# Patient Record
Sex: Female | Born: 1945 | Race: White | Hispanic: No | Marital: Married | Smoking: Former smoker
Health system: Southern US, Community
[De-identification: ages and names within clinical notes are randomized; demographics above are authoritative.]

## PROBLEM LIST (undated history)

## (undated) DIAGNOSIS — J309 Allergic rhinitis, unspecified: Secondary | ICD-10-CM

## (undated) DIAGNOSIS — H6980 Other specified disorders of Eustachian tube, unspecified ear: Secondary | ICD-10-CM

## (undated) DIAGNOSIS — R32 Unspecified urinary incontinence: Secondary | ICD-10-CM

## (undated) DIAGNOSIS — G8929 Other chronic pain: Secondary | ICD-10-CM

## (undated) DIAGNOSIS — I471 Supraventricular tachycardia, unspecified: Secondary | ICD-10-CM

## (undated) DIAGNOSIS — R0609 Other forms of dyspnea: Secondary | ICD-10-CM

## (undated) DIAGNOSIS — M21619 Bunion of unspecified foot: Secondary | ICD-10-CM

## (undated) DIAGNOSIS — R0789 Other chest pain: Secondary | ICD-10-CM

## (undated) DIAGNOSIS — D72829 Elevated white blood cell count, unspecified: Secondary | ICD-10-CM

## (undated) DIAGNOSIS — E785 Hyperlipidemia, unspecified: Secondary | ICD-10-CM

## (undated) DIAGNOSIS — K5900 Constipation, unspecified: Secondary | ICD-10-CM

## (undated) DIAGNOSIS — Z86010 Personal history of colonic polyps: Secondary | ICD-10-CM

## (undated) DIAGNOSIS — M545 Low back pain, unspecified: Secondary | ICD-10-CM

## (undated) DIAGNOSIS — F419 Anxiety disorder, unspecified: Secondary | ICD-10-CM

## (undated) DIAGNOSIS — F329 Major depressive disorder, single episode, unspecified: Secondary | ICD-10-CM

## (undated) DIAGNOSIS — M549 Dorsalgia, unspecified: Secondary | ICD-10-CM

## (undated) DIAGNOSIS — M79609 Pain in unspecified limb: Secondary | ICD-10-CM

## (undated) DIAGNOSIS — R002 Palpitations: Secondary | ICD-10-CM

## (undated) DIAGNOSIS — R0989 Other specified symptoms and signs involving the circulatory and respiratory systems: Secondary | ICD-10-CM

## (undated) DIAGNOSIS — M1710 Unilateral primary osteoarthritis, unspecified knee: Secondary | ICD-10-CM

## (undated) DIAGNOSIS — R42 Dizziness and giddiness: Secondary | ICD-10-CM

## (undated) DIAGNOSIS — M25519 Pain in unspecified shoulder: Secondary | ICD-10-CM

## (undated) HISTORY — DX: Other chest pain: R07.89

## (undated) HISTORY — PX: CHOLECYSTECTOMY: SHX55

## (undated) HISTORY — PX: LUMBAR LAMINECTOMY: SHX95

## (undated) HISTORY — DX: Anxiety disorder, unspecified: F41.9

## (undated) HISTORY — DX: Unspecified urinary incontinence: R32

## (undated) HISTORY — DX: Other specified symptoms and signs involving the circulatory and respiratory systems: R09.89

## (undated) HISTORY — DX: Supraventricular tachycardia, unspecified: I47.10

## (undated) HISTORY — DX: Other specified disorders of Eustachian tube, unspecified ear: H69.80

## (undated) HISTORY — DX: Dizziness and giddiness: R42

## (undated) HISTORY — DX: Personal history of colonic polyps: Z86.010

## (undated) HISTORY — PX: BUNIONECTOMY: SHX129

## (undated) HISTORY — DX: Major depressive disorder, single episode, unspecified: F32.9

## (undated) HISTORY — DX: Low back pain: M54.5

## (undated) HISTORY — DX: Palpitations: R00.2

## (undated) HISTORY — DX: Pain in unspecified limb: M79.609

## (undated) HISTORY — DX: Low back pain, unspecified: M54.50

## (undated) HISTORY — DX: Hyperlipidemia, unspecified: E78.5

## (undated) HISTORY — DX: Pain in unspecified shoulder: M25.519

## (undated) HISTORY — PX: OTHER SURGICAL HISTORY: SHX169

## (undated) HISTORY — DX: Constipation, unspecified: K59.00

## (undated) HISTORY — DX: Bunion of unspecified foot: M21.619

## (undated) HISTORY — DX: Elevated white blood cell count, unspecified: D72.829

## (undated) HISTORY — DX: Unilateral primary osteoarthritis, unspecified knee: M17.10

## (undated) HISTORY — DX: Dorsalgia, unspecified: M54.9

## (undated) HISTORY — DX: Other forms of dyspnea: R06.09

## (undated) HISTORY — DX: Other chronic pain: G89.29

## (undated) HISTORY — DX: Allergic rhinitis, unspecified: J30.9

## (undated) HISTORY — DX: Supraventricular tachycardia: I47.1

---

## 1998-07-07 ENCOUNTER — Emergency Department (HOSPITAL_COMMUNITY): Admission: EM | Admit: 1998-07-07 | Discharge: 1998-07-07 | Payer: Self-pay | Admitting: Emergency Medicine

## 1998-08-05 ENCOUNTER — Emergency Department (HOSPITAL_COMMUNITY): Admission: EM | Admit: 1998-08-05 | Discharge: 1998-08-05 | Payer: Self-pay | Admitting: Emergency Medicine

## 1998-08-25 ENCOUNTER — Inpatient Hospital Stay (HOSPITAL_COMMUNITY): Admission: EM | Admit: 1998-08-25 | Discharge: 1998-08-29 | Payer: Self-pay | Admitting: *Deleted

## 1998-11-14 ENCOUNTER — Other Ambulatory Visit: Admission: RE | Admit: 1998-11-14 | Discharge: 1998-11-14 | Payer: Self-pay | Admitting: *Deleted

## 1998-12-12 ENCOUNTER — Encounter: Admission: RE | Admit: 1998-12-12 | Discharge: 1998-12-12 | Payer: Self-pay | Admitting: *Deleted

## 1999-01-21 ENCOUNTER — Emergency Department (HOSPITAL_COMMUNITY): Admission: EM | Admit: 1999-01-21 | Discharge: 1999-01-21 | Payer: Self-pay | Admitting: Emergency Medicine

## 1999-01-30 ENCOUNTER — Inpatient Hospital Stay (HOSPITAL_COMMUNITY): Admission: EM | Admit: 1999-01-30 | Discharge: 1999-02-01 | Payer: Self-pay | Admitting: Emergency Medicine

## 1999-02-13 ENCOUNTER — Emergency Department (HOSPITAL_COMMUNITY): Admission: EM | Admit: 1999-02-13 | Discharge: 1999-02-13 | Payer: Self-pay | Admitting: Emergency Medicine

## 2000-06-06 ENCOUNTER — Other Ambulatory Visit: Admission: RE | Admit: 2000-06-06 | Discharge: 2000-06-06 | Payer: Self-pay | Admitting: *Deleted

## 2000-09-07 ENCOUNTER — Emergency Department (HOSPITAL_COMMUNITY): Admission: EM | Admit: 2000-09-07 | Discharge: 2000-09-07 | Payer: Self-pay

## 2000-12-31 ENCOUNTER — Ambulatory Visit (HOSPITAL_COMMUNITY): Admission: RE | Admit: 2000-12-31 | Discharge: 2000-12-31 | Payer: Self-pay | Admitting: Gastroenterology

## 2000-12-31 ENCOUNTER — Encounter (INDEPENDENT_AMBULATORY_CARE_PROVIDER_SITE_OTHER): Payer: Self-pay | Admitting: Specialist

## 2001-08-18 ENCOUNTER — Encounter: Payer: Self-pay | Admitting: Internal Medicine

## 2001-08-18 ENCOUNTER — Encounter: Admission: RE | Admit: 2001-08-18 | Discharge: 2001-08-18 | Payer: Self-pay | Admitting: Internal Medicine

## 2002-02-08 ENCOUNTER — Emergency Department (HOSPITAL_COMMUNITY): Admission: EM | Admit: 2002-02-08 | Discharge: 2002-02-08 | Payer: Self-pay | Admitting: Emergency Medicine

## 2002-03-24 ENCOUNTER — Other Ambulatory Visit: Admission: RE | Admit: 2002-03-24 | Discharge: 2002-03-24 | Payer: Self-pay | Admitting: Gynecology

## 2002-09-03 ENCOUNTER — Ambulatory Visit (HOSPITAL_COMMUNITY): Admission: RE | Admit: 2002-09-03 | Discharge: 2002-09-03 | Payer: Self-pay | Admitting: Specialist

## 2002-09-03 ENCOUNTER — Encounter: Payer: Self-pay | Admitting: Specialist

## 2003-06-27 ENCOUNTER — Other Ambulatory Visit: Admission: RE | Admit: 2003-06-27 | Discharge: 2003-06-27 | Payer: Self-pay | Admitting: Gynecology

## 2003-07-07 ENCOUNTER — Emergency Department (HOSPITAL_COMMUNITY): Admission: EM | Admit: 2003-07-07 | Discharge: 2003-07-07 | Payer: Self-pay | Admitting: Emergency Medicine

## 2003-08-02 ENCOUNTER — Inpatient Hospital Stay (HOSPITAL_COMMUNITY): Admission: EM | Admit: 2003-08-02 | Discharge: 2003-08-09 | Payer: Self-pay | Admitting: Psychiatry

## 2003-08-18 ENCOUNTER — Inpatient Hospital Stay (HOSPITAL_COMMUNITY): Admission: EM | Admit: 2003-08-18 | Discharge: 2003-08-26 | Payer: Self-pay | Admitting: Psychiatry

## 2003-09-10 ENCOUNTER — Emergency Department (HOSPITAL_COMMUNITY): Admission: EM | Admit: 2003-09-10 | Discharge: 2003-09-10 | Payer: Self-pay | Admitting: Emergency Medicine

## 2003-09-10 ENCOUNTER — Encounter: Payer: Self-pay | Admitting: Emergency Medicine

## 2003-10-03 ENCOUNTER — Encounter: Payer: Self-pay | Admitting: Emergency Medicine

## 2003-10-03 ENCOUNTER — Emergency Department (HOSPITAL_COMMUNITY): Admission: EM | Admit: 2003-10-03 | Discharge: 2003-10-03 | Payer: Self-pay | Admitting: Emergency Medicine

## 2004-03-07 ENCOUNTER — Inpatient Hospital Stay (HOSPITAL_COMMUNITY): Admission: EM | Admit: 2004-03-07 | Discharge: 2004-03-16 | Payer: Self-pay | Admitting: Psychiatry

## 2004-10-04 ENCOUNTER — Other Ambulatory Visit: Admission: RE | Admit: 2004-10-04 | Discharge: 2004-10-04 | Payer: Self-pay | Admitting: Gynecology

## 2004-12-09 LAB — HM COLONOSCOPY: HM Colonoscopy: ABNORMAL

## 2005-03-05 ENCOUNTER — Inpatient Hospital Stay (HOSPITAL_COMMUNITY): Admission: RE | Admit: 2005-03-05 | Discharge: 2005-03-11 | Payer: Self-pay | Admitting: Psychiatry

## 2005-03-05 ENCOUNTER — Ambulatory Visit: Payer: Self-pay | Admitting: Psychiatry

## 2005-03-06 ENCOUNTER — Encounter (HOSPITAL_COMMUNITY): Payer: Self-pay | Admitting: Psychiatry

## 2005-03-06 IMAGING — CR DG CHEST 2V
2 series · 2 of 2 positions shown · non-contrast
Comparison: none

CLINICAL DATA: Short of breath.  Cough.  
 CHEST - 2 VIEW:
 Minimal linear scarring or atelectasis laterally at the left lung base.  Vascular clips in the upper abdomen.   Right lung clear.  Heart size and pulmonary vascularity normal.  No effusion.

[view not recorded (1 of 2)]
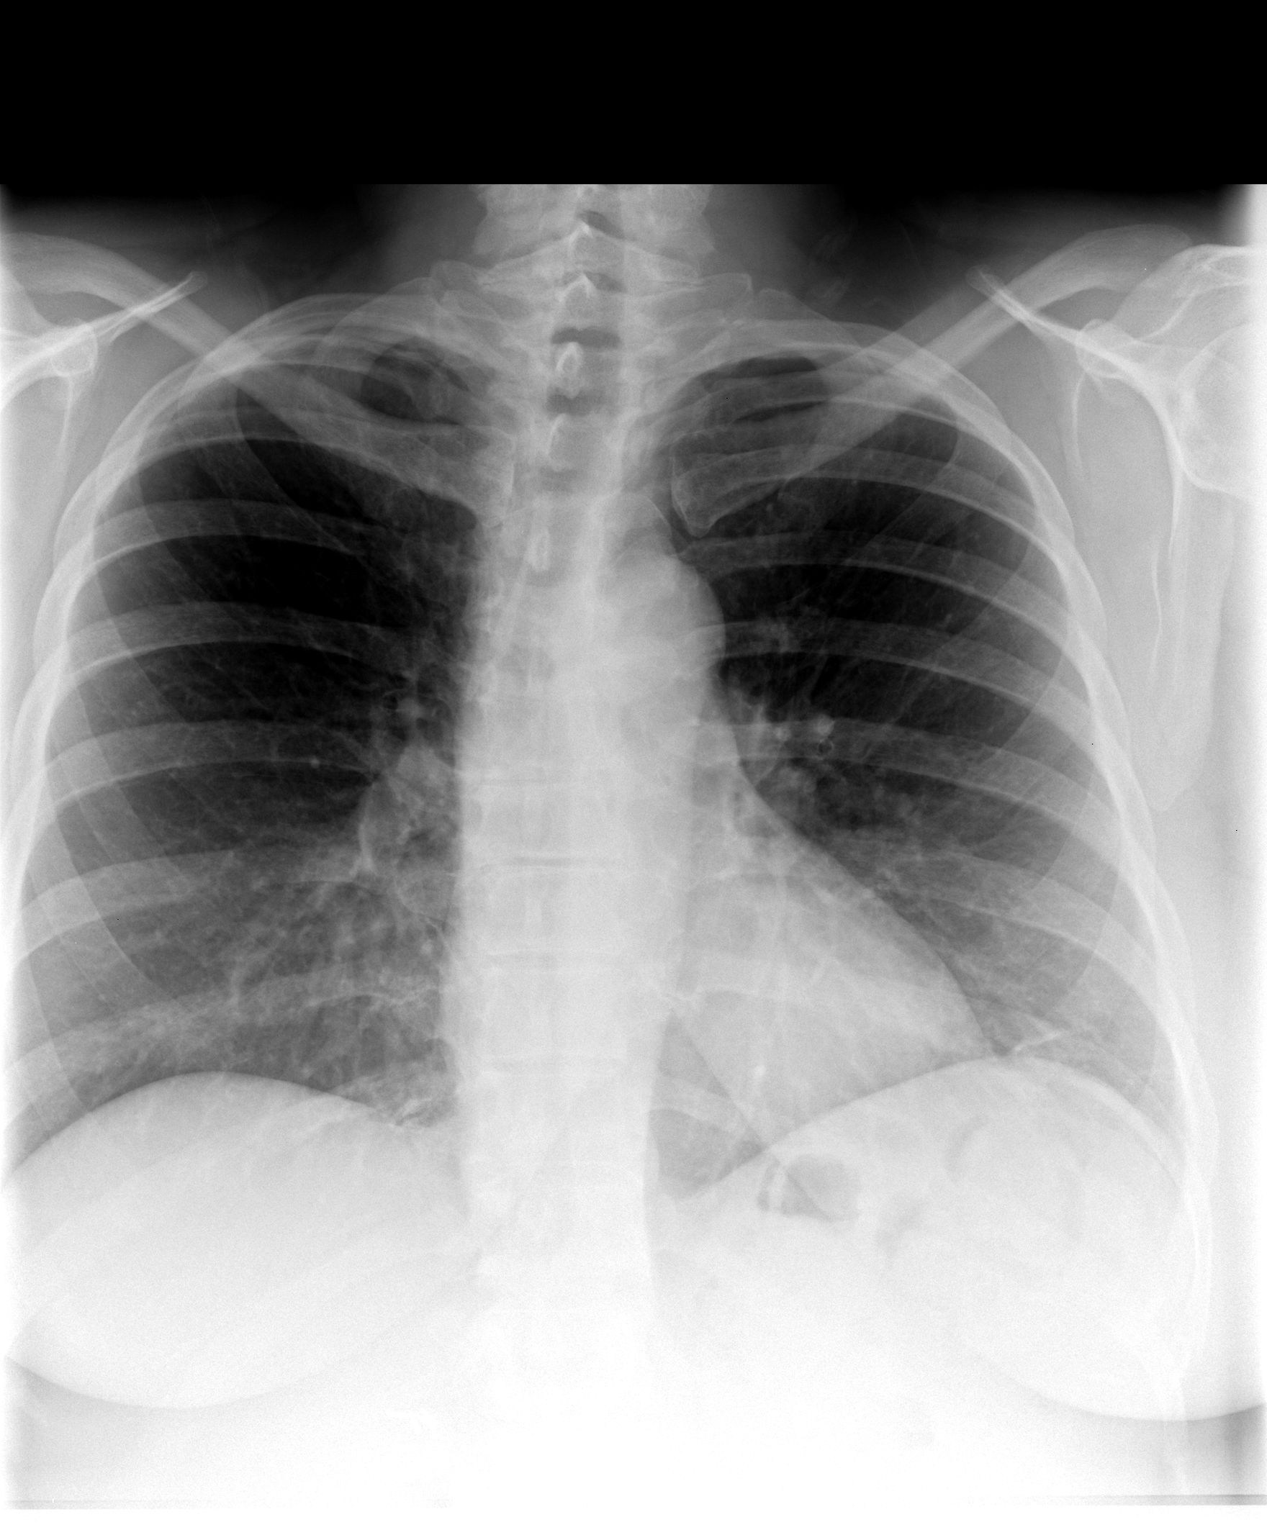

[view not recorded (2 of 2)]
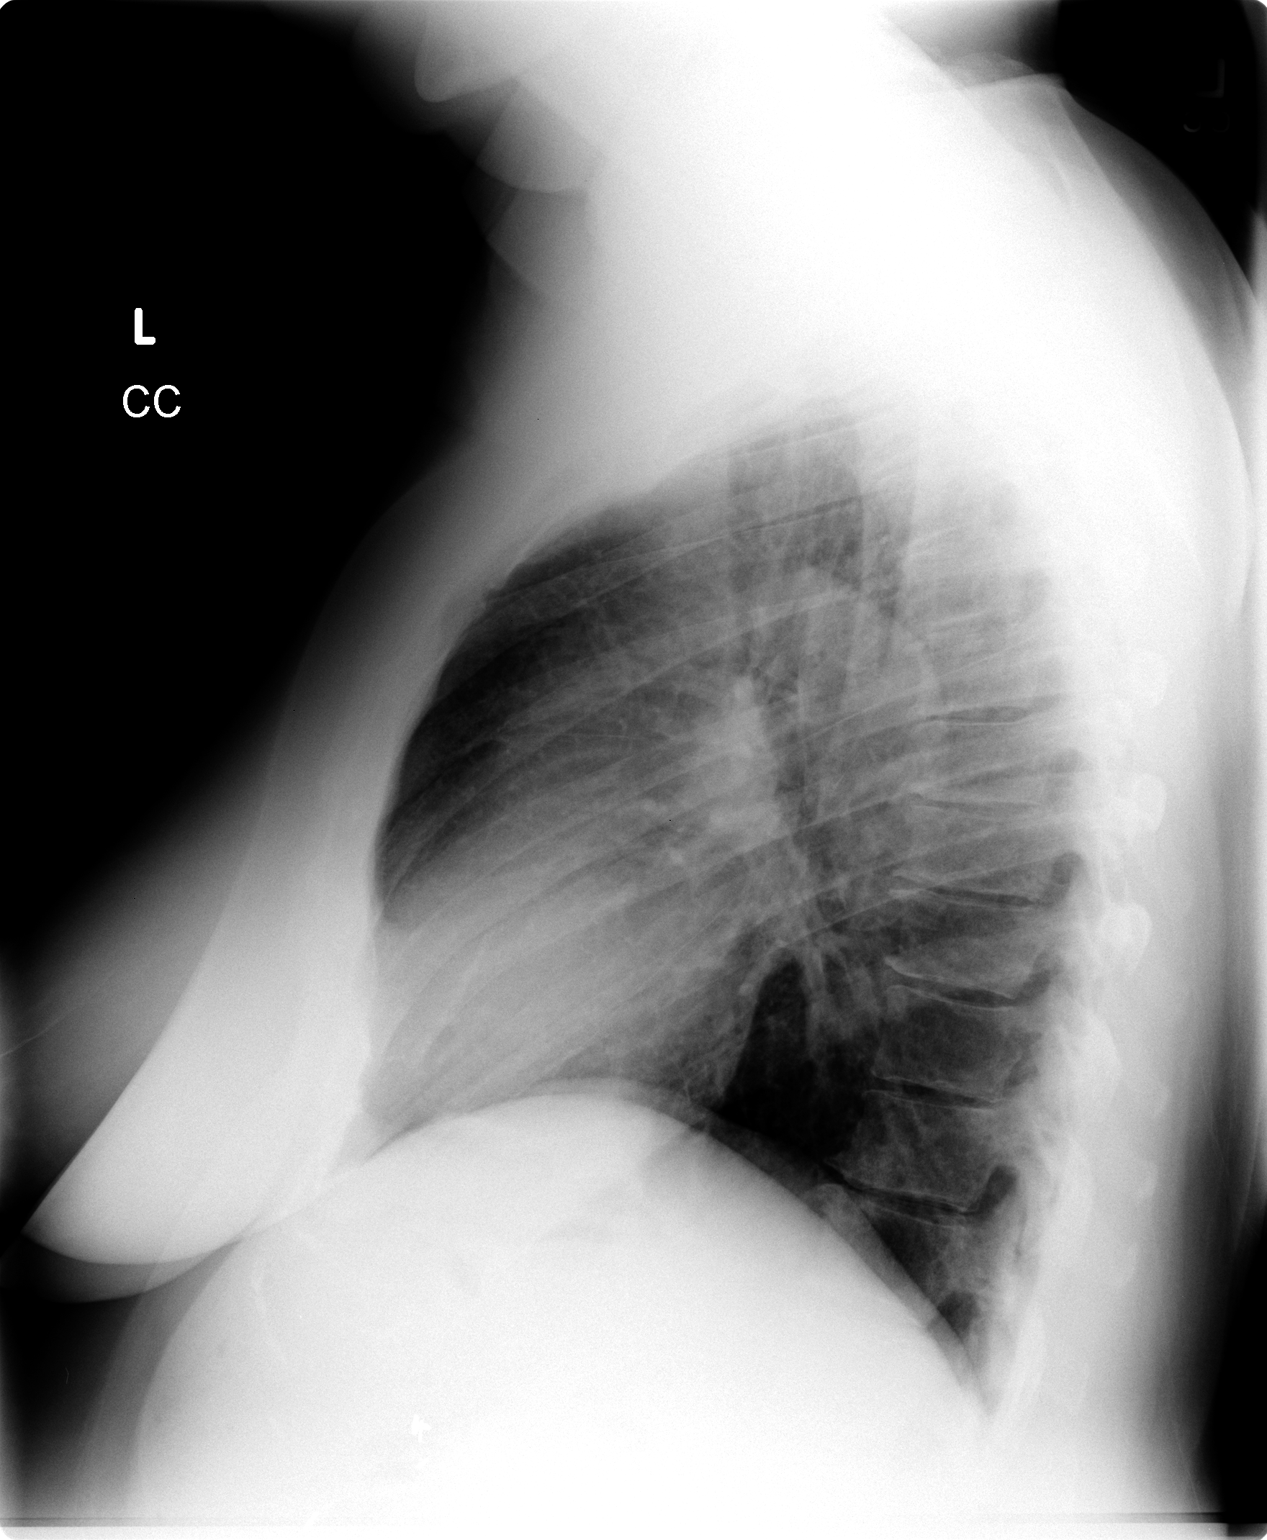

[2 of 2 positions shown; findings below may reference images not displayed]

IMPRESSION: No acute disease.

## 2005-03-26 ENCOUNTER — Inpatient Hospital Stay (HOSPITAL_COMMUNITY): Admission: RE | Admit: 2005-03-26 | Discharge: 2005-04-03 | Payer: Self-pay | Admitting: Psychiatry

## 2005-11-11 ENCOUNTER — Other Ambulatory Visit: Admission: RE | Admit: 2005-11-11 | Discharge: 2005-11-11 | Payer: Self-pay | Admitting: Gynecology

## 2005-12-09 ENCOUNTER — Encounter: Payer: Self-pay | Admitting: Internal Medicine

## 2005-12-09 LAB — HM MAMMOGRAPHY: HM Mammogram: NORMAL

## 2007-01-10 ENCOUNTER — Emergency Department (HOSPITAL_COMMUNITY): Admission: EM | Admit: 2007-01-10 | Discharge: 2007-01-10 | Payer: Self-pay | Admitting: Emergency Medicine

## 2007-02-15 ENCOUNTER — Emergency Department (HOSPITAL_COMMUNITY): Admission: EM | Admit: 2007-02-15 | Discharge: 2007-02-16 | Payer: Self-pay | Admitting: Emergency Medicine

## 2007-09-11 ENCOUNTER — Ambulatory Visit: Payer: Self-pay | Admitting: Internal Medicine

## 2007-09-11 LAB — CONVERTED CEMR LAB
ALT: 16 units/L (ref 0–35)
AST: 20 units/L (ref 0–37)
Alkaline Phosphatase: 84 units/L (ref 39–117)
BUN: 14 mg/dL (ref 6–23)
Basophils Relative: 0.4 % (ref 0.0–1.0)
Bilirubin, Direct: 0.2 mg/dL (ref 0.0–0.3)
CO2: 28 meq/L (ref 19–32)
Calcium: 9.7 mg/dL (ref 8.4–10.5)
Chloride: 105 meq/L (ref 96–112)
Creatinine, Ser: 0.9 mg/dL (ref 0.4–1.2)
Crystals: NEGATIVE
Eosinophils Absolute: 0.2 10*3/uL (ref 0.0–0.6)
Eosinophils Relative: 1.6 % (ref 0.0–5.0)
GFR calc Af Amer: 82 mL/min
Glucose, Bld: 93 mg/dL (ref 70–99)
HCT: 42 % (ref 36.0–46.0)
Hemoglobin, Urine: NEGATIVE
Ketones, ur: NEGATIVE mg/dL
Lymphocytes Relative: 36.3 % (ref 12.0–46.0)
MCV: 91.8 fL (ref 78.0–100.0)
Mucus, UA: NEGATIVE
Neutrophils Relative %: 55.4 % (ref 43.0–77.0)
Platelets: 365 10*3/uL (ref 150–400)
RBC: 4.57 M/uL (ref 3.87–5.11)
TSH: 3.25 microintl units/mL (ref 0.35–5.50)
Total Protein, Urine: NEGATIVE mg/dL
Total Protein: 7.2 g/dL (ref 6.0–8.3)
Triglycerides: 211 mg/dL (ref 0–149)
VLDL: 42 mg/dL — ABNORMAL HIGH (ref 0–40)
WBC: 11.7 10*3/uL — ABNORMAL HIGH (ref 4.5–10.5)

## 2007-09-13 ENCOUNTER — Encounter: Payer: Self-pay | Admitting: Internal Medicine

## 2007-09-13 DIAGNOSIS — Z860100 Personal history of colon polyps, unspecified: Secondary | ICD-10-CM | POA: Insufficient documentation

## 2007-09-13 DIAGNOSIS — IMO0002 Reserved for concepts with insufficient information to code with codable children: Secondary | ICD-10-CM

## 2007-09-13 DIAGNOSIS — F329 Major depressive disorder, single episode, unspecified: Secondary | ICD-10-CM

## 2007-09-13 DIAGNOSIS — F32A Depression, unspecified: Secondary | ICD-10-CM | POA: Insufficient documentation

## 2007-09-13 DIAGNOSIS — M170 Bilateral primary osteoarthritis of knee: Secondary | ICD-10-CM | POA: Insufficient documentation

## 2007-09-13 DIAGNOSIS — F3289 Other specified depressive episodes: Secondary | ICD-10-CM

## 2007-09-13 DIAGNOSIS — R002 Palpitations: Secondary | ICD-10-CM

## 2007-09-13 DIAGNOSIS — M1710 Unilateral primary osteoarthritis, unspecified knee: Secondary | ICD-10-CM | POA: Insufficient documentation

## 2007-09-13 HISTORY — DX: Personal history of colonic polyps: Z86.010

## 2007-09-13 HISTORY — DX: Other specified depressive episodes: F32.89

## 2007-09-13 HISTORY — DX: Major depressive disorder, single episode, unspecified: F32.9

## 2007-09-13 HISTORY — DX: Palpitations: R00.2

## 2007-09-13 HISTORY — DX: Reserved for concepts with insufficient information to code with codable children: IMO0002

## 2007-09-13 HISTORY — DX: Personal history of colon polyps, unspecified: Z86.0100

## 2007-09-18 ENCOUNTER — Ambulatory Visit: Payer: Self-pay | Admitting: Family Medicine

## 2007-10-22 ENCOUNTER — Telehealth (INDEPENDENT_AMBULATORY_CARE_PROVIDER_SITE_OTHER): Payer: Self-pay | Admitting: *Deleted

## 2007-10-23 ENCOUNTER — Other Ambulatory Visit: Admission: RE | Admit: 2007-10-23 | Discharge: 2007-10-23 | Payer: Self-pay | Admitting: Gynecology

## 2007-10-26 ENCOUNTER — Ambulatory Visit: Payer: Self-pay | Admitting: Internal Medicine

## 2007-11-04 ENCOUNTER — Telehealth (INDEPENDENT_AMBULATORY_CARE_PROVIDER_SITE_OTHER): Payer: Self-pay | Admitting: *Deleted

## 2007-12-18 ENCOUNTER — Telehealth: Payer: Self-pay | Admitting: Internal Medicine

## 2007-12-23 ENCOUNTER — Telehealth (INDEPENDENT_AMBULATORY_CARE_PROVIDER_SITE_OTHER): Payer: Self-pay | Admitting: *Deleted

## 2007-12-23 DIAGNOSIS — M21619 Bunion of unspecified foot: Secondary | ICD-10-CM | POA: Insufficient documentation

## 2007-12-23 HISTORY — DX: Bunion of unspecified foot: M21.619

## 2008-01-04 ENCOUNTER — Telehealth (INDEPENDENT_AMBULATORY_CARE_PROVIDER_SITE_OTHER): Payer: Self-pay | Admitting: *Deleted

## 2008-01-05 ENCOUNTER — Encounter: Admission: RE | Admit: 2008-01-05 | Discharge: 2008-01-05 | Payer: Self-pay | Admitting: Specialist

## 2008-01-05 IMAGING — CT CT CERVICAL SPINE W/O CM
1 of 13 series · 3 of 20 positions shown, 4 images · IV contrast (agent unspecified)
Comparison: None.
COMPARISON: None.

CLINICAL DATA: Neck pain.  Back pain with left leg pain.  
 CERVICAL SPINE CT WITHOUT CONTRAST:
TECHNIQUE: Multidetector CT imaging of the cervical spine was performed.  Multiplanar CT image reconstructions were also generated.
TECHNIQUE: Multidetector CT imaging of the lumbar spine was performed.  Multiplanar CT image reconstructions were also generated.

[Series 6: bone windows · axial · 0.27mm/px · z∈[-446,-261]mm · 3 of 75 slices shown, 4 images]
[im 1/75  soft-tissue]
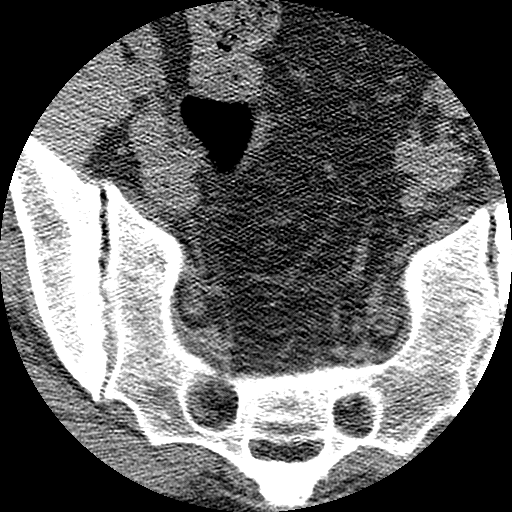
[im 1/75  bone]
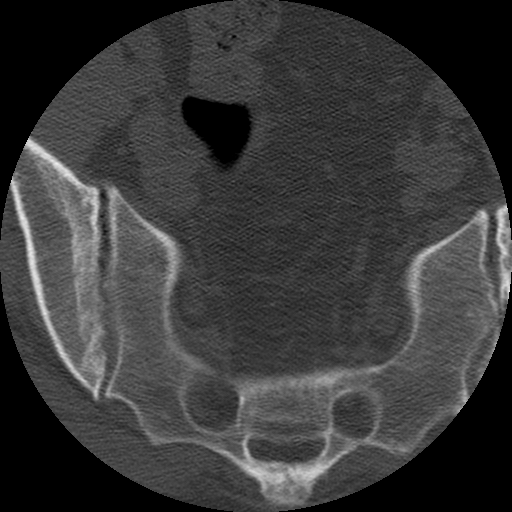
[im 38/75  bone]
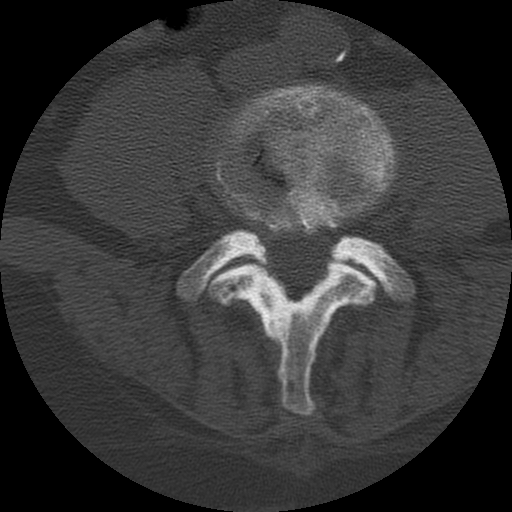
[im 75/75  bone]
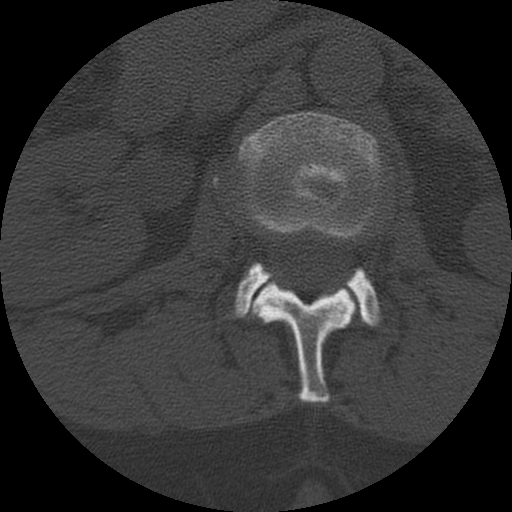

[3 of 20 positions shown; findings below may reference images not displayed]

FINDINGS: The cervical alignment is normal.  There is no fracture or mass lesion.  Multilevel degenerative changes seen in the cervical spine.  
 C2-3:   There is mild disc degeneration and mild facet arthropathy on the right.  No significant spinal stenosis.  
 C3-4:  Moderate disc degeneration and spondylosis.  There is diffuse uncinate spurring, left greater than right.  This is contributing to moderate left foraminal encroachment and there is mild central canal stenosis.
 C4-5:  There is disc degeneration and spondylosis with mild uncinate spurring.  There is advanced facet arthropathy on the right.  There is mild right foraminal narrowing and mild central canal stenosis.
 C5-6:  Disc degeneration with spondylosis.  Diffuse uncinate spurring is present asymmetric on the left.  There is mild to moderate left foraminal encroachment and mild central canal stenosis.
 C6-7:  Disc degeneration with mild uncinate spurring diffusely.  
 C7-T1: Mild disc degeneration.
IMPRESSION: Cervical disc degeneration and spondylosis as described above.
 LUMBAR SPINE CT WITHOUT CONTRAST:
FINDINGS: Normal lumbar alignment.  No fracture or mass lesion is identified.  There is multilevel degenerative change.
 L1-2:  Disc degeneration with disc bulging.  There is vertebral spurring.  There is mild facet arthropathy and mild central canal stenosis.
 L2-3:  Moderate disc degeneration with disc bulging and diffuse vertebral spurring.  There is moderate facet arthropathy.  Degenerative end plate changes are present with sclerosis and cystic changes in the end plates.  There is moderately severe spinal stenosis.
 L3-4:  There is moderate disc degeneration with vertebral spurring, right greater than left.  There is narrowing of the right lateral recess due to an osteophyte.  There is mild to moderate facet arthropathy and mild central canal stenosis.
 L4-5:  There is moderate to advanced disc degeneration with vertebral spurring and moderate facet arthropathy.  Neural foramina are sufficiently patent and there is mild central stenosis.
 L5-S1:  Mild disc degeneration and early facet arthropathy on the left.
IMPRESSION: Multilevel lumbar degenerative changes and spondylosis as above.  There is moderate spinal stenosis at L2-3 due to disc degeneration and facet degeneration.  There is right lateral recess stenosis at L3-4 due to an osteophyte.  See above report for details.

## 2008-03-11 ENCOUNTER — Ambulatory Visit: Payer: Self-pay | Admitting: Internal Medicine

## 2008-03-11 DIAGNOSIS — E785 Hyperlipidemia, unspecified: Secondary | ICD-10-CM

## 2008-03-11 HISTORY — DX: Hyperlipidemia, unspecified: E78.5

## 2008-03-23 ENCOUNTER — Telehealth: Payer: Self-pay | Admitting: Internal Medicine

## 2008-05-23 ENCOUNTER — Ambulatory Visit: Payer: Self-pay | Admitting: Internal Medicine

## 2008-05-23 DIAGNOSIS — H6690 Otitis media, unspecified, unspecified ear: Secondary | ICD-10-CM | POA: Insufficient documentation

## 2008-06-01 ENCOUNTER — Telehealth (INDEPENDENT_AMBULATORY_CARE_PROVIDER_SITE_OTHER): Payer: Self-pay | Admitting: *Deleted

## 2008-06-06 ENCOUNTER — Ambulatory Visit: Payer: Self-pay | Admitting: Internal Medicine

## 2008-06-17 ENCOUNTER — Telehealth: Payer: Self-pay | Admitting: Internal Medicine

## 2008-09-27 ENCOUNTER — Ambulatory Visit: Payer: Self-pay | Admitting: Internal Medicine

## 2008-09-27 DIAGNOSIS — M79609 Pain in unspecified limb: Secondary | ICD-10-CM | POA: Insufficient documentation

## 2008-09-27 DIAGNOSIS — K5900 Constipation, unspecified: Secondary | ICD-10-CM | POA: Insufficient documentation

## 2008-09-27 DIAGNOSIS — M549 Dorsalgia, unspecified: Secondary | ICD-10-CM | POA: Insufficient documentation

## 2008-09-27 HISTORY — DX: Pain in unspecified limb: M79.609

## 2008-09-27 HISTORY — DX: Dorsalgia, unspecified: M54.9

## 2008-09-27 HISTORY — DX: Constipation, unspecified: K59.00

## 2008-09-27 LAB — CONVERTED CEMR LAB
ALT: 14 units/L (ref 0–35)
Basophils Absolute: 0 10*3/uL (ref 0.0–0.1)
Basophils Relative: 0.2 % (ref 0.0–3.0)
Calcium: 9.2 mg/dL (ref 8.4–10.5)
Cholesterol: 126 mg/dL (ref 0–200)
Creatinine, Ser: 0.9 mg/dL (ref 0.4–1.2)
Crystals: NEGATIVE
Eosinophils Absolute: 0.2 10*3/uL (ref 0.0–0.7)
GFR calc Af Amer: 82 mL/min
GFR calc non Af Amer: 67 mL/min
HDL: 41.3 mg/dL (ref >39.0)
Hemoglobin: 14.3 g/dL (ref 12.0–15.0)
LDL Cholesterol: 45 mg/dL (ref 0–99)
MCHC: 35.4 g/dL (ref 30.0–36.0)
MCV: 91.1 fL (ref 78.0–100.0)
Neutro Abs: 5.7 10*3/uL (ref 1.4–7.7)
RBC: 4.44 M/uL (ref 3.87–5.11)
RDW: 11.4 % — ABNORMAL LOW (ref 11.5–14.6)
Specific Gravity, Urine: >=1.03 (ref 1.000–1.03)
TSH: 3.02 microintl units/mL (ref 0.35–5.50)
Total Bilirubin: 0.6 mg/dL (ref 0.3–1.2)
Total CHOL/HDL Ratio: 3.1
Triglycerides: 197 mg/dL — ABNORMAL HIGH (ref 0–149)
Urine Glucose: NEGATIVE mg/dL
Urobilinogen, UA: 0.2 (ref 0.0–1.0)
pH: 5 (ref 5.0–8.0)

## 2008-09-27 IMAGING — CR DG CHEST 2V
2 series · 2 of 2 positions shown · non-contrast
Comparison: [DATE]

CLINICAL DATA: Acute bronchitis.  Cough and shortness of breath.
Previous smoker.

CHEST - 2 VIEW

[view not recorded (1 of 2)]
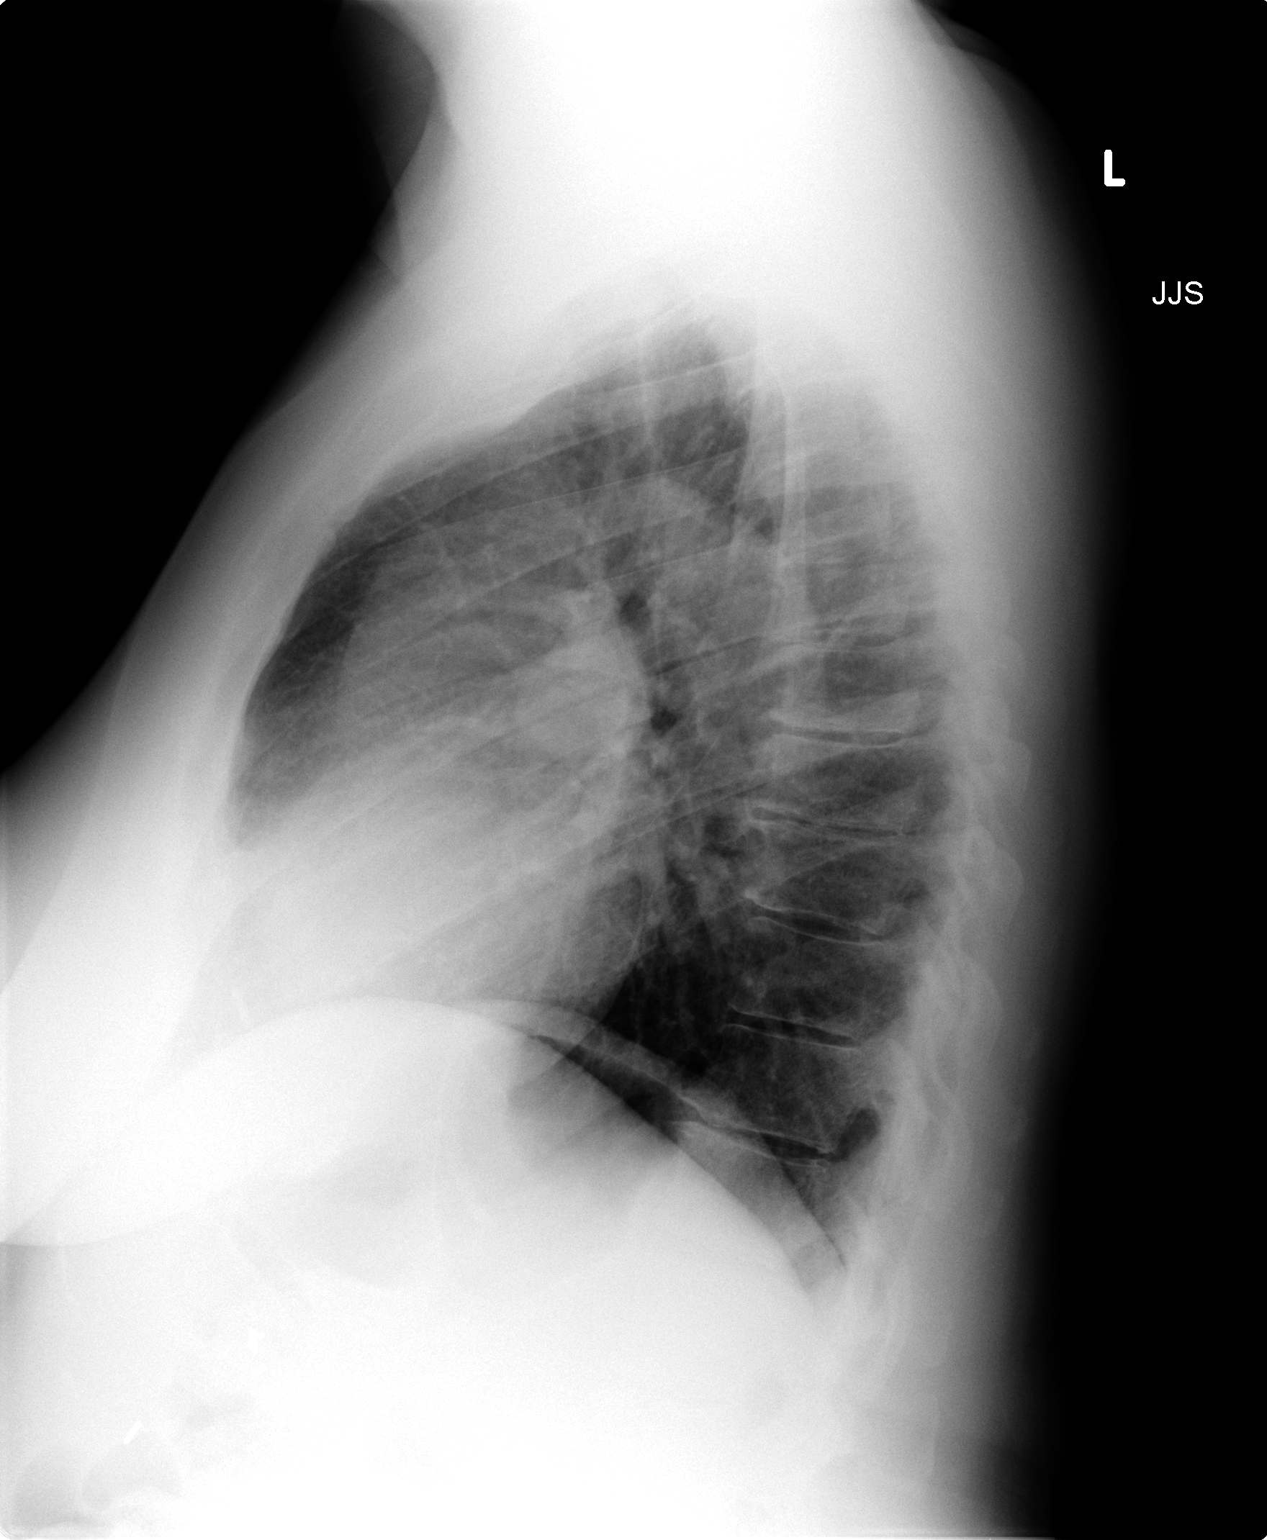

[view not recorded (2 of 2)]
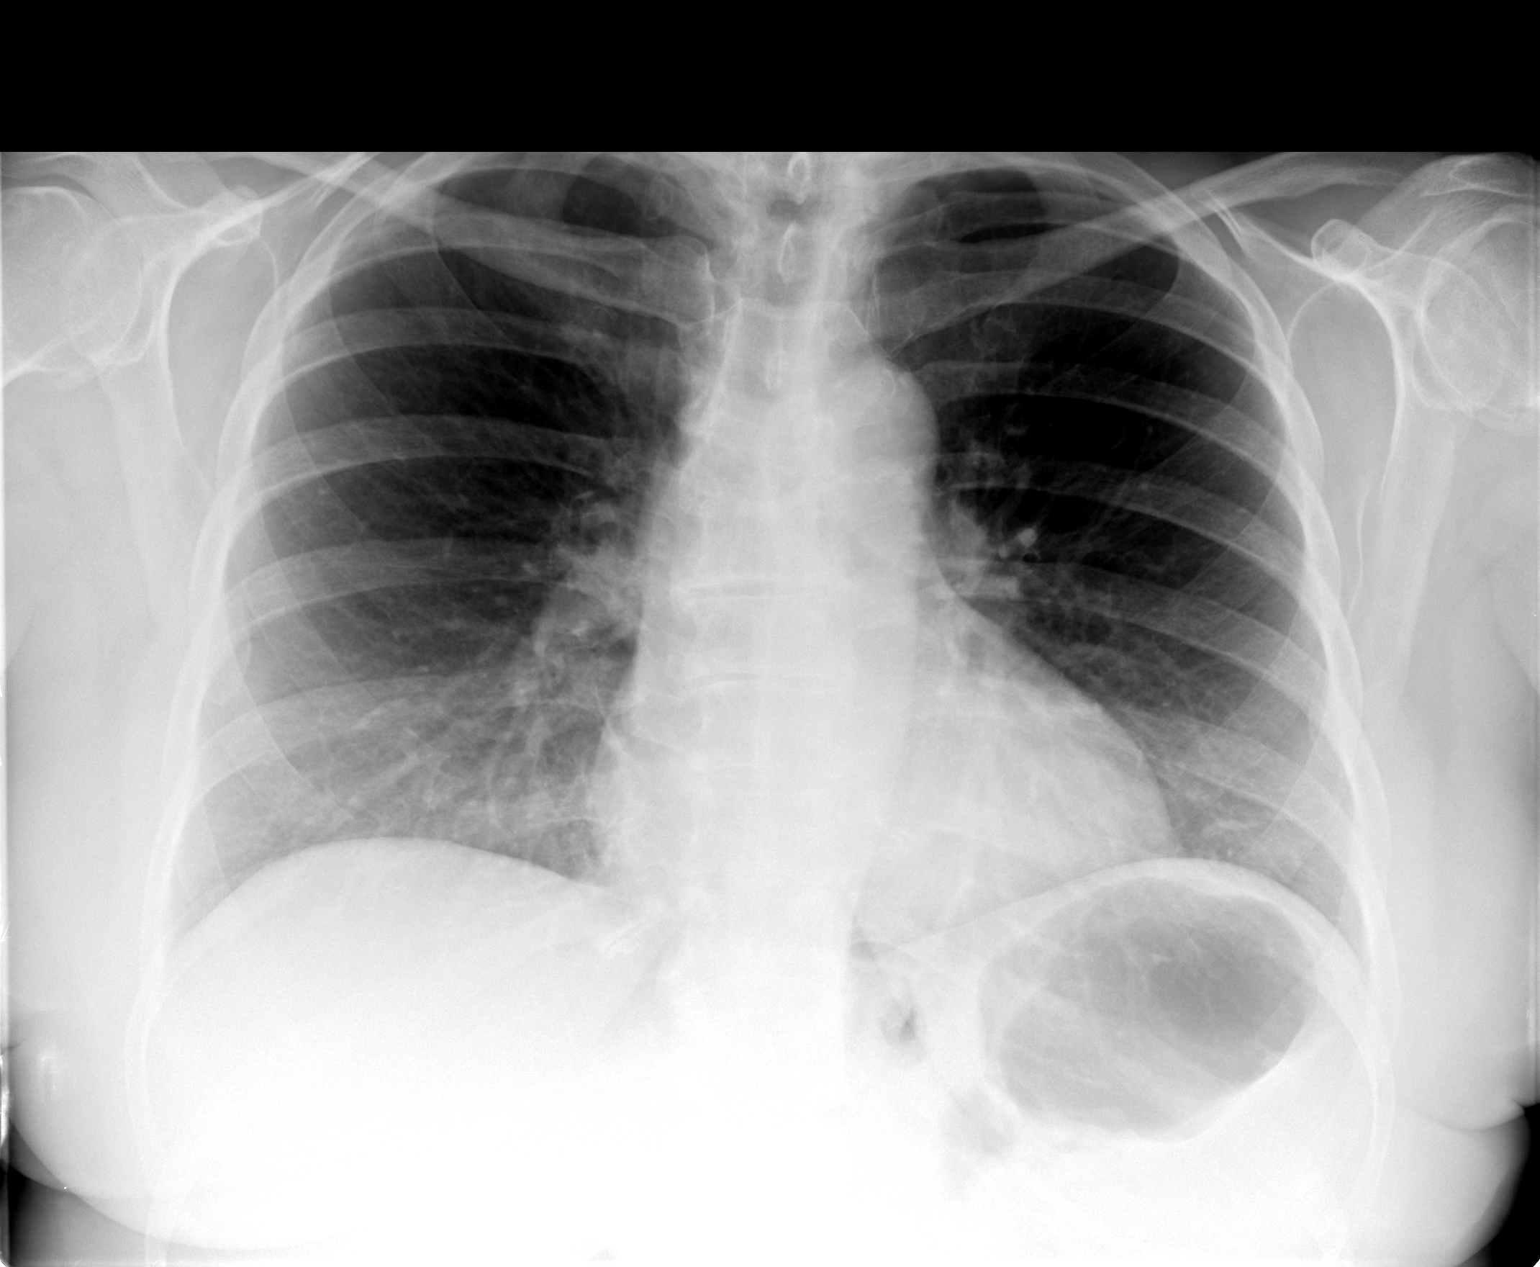

[2 of 2 positions shown; findings below may reference images not displayed]

FINDINGS: Normal cardiac size shape.  No active pulmonary process.
Minimal linear scarring left lung base.
IMPRESSION: No acute chest findings.  No interval change.

## 2008-09-28 LAB — CONVERTED CEMR LAB: Vit D, 1,25-Dihydroxy: 36 (ref 30–89)

## 2008-10-01 ENCOUNTER — Encounter: Payer: Self-pay | Admitting: Internal Medicine

## 2008-10-05 ENCOUNTER — Telehealth: Payer: Self-pay | Admitting: Internal Medicine

## 2008-11-17 ENCOUNTER — Ambulatory Visit: Payer: Self-pay | Admitting: Internal Medicine

## 2009-02-02 ENCOUNTER — Emergency Department (HOSPITAL_COMMUNITY): Admission: EM | Admit: 2009-02-02 | Discharge: 2009-02-02 | Payer: Self-pay | Admitting: Emergency Medicine

## 2009-02-14 ENCOUNTER — Ambulatory Visit: Payer: Self-pay | Admitting: Internal Medicine

## 2009-02-14 DIAGNOSIS — M25519 Pain in unspecified shoulder: Secondary | ICD-10-CM

## 2009-02-14 DIAGNOSIS — R109 Unspecified abdominal pain: Secondary | ICD-10-CM | POA: Insufficient documentation

## 2009-02-14 HISTORY — DX: Pain in unspecified shoulder: M25.519

## 2009-02-14 LAB — CONVERTED CEMR LAB
Crystals: NEGATIVE
Ketones, ur: NEGATIVE mg/dL
Leukocytes, UA: NEGATIVE
Specific Gravity, Urine: <=1.005 (ref 1.000–1.035)
Urobilinogen, UA: 0.2 (ref 0.0–1.0)

## 2009-02-15 ENCOUNTER — Encounter: Payer: Self-pay | Admitting: Internal Medicine

## 2009-02-20 ENCOUNTER — Telehealth: Payer: Self-pay | Admitting: Internal Medicine

## 2009-02-22 ENCOUNTER — Emergency Department (HOSPITAL_COMMUNITY): Admission: EM | Admit: 2009-02-22 | Discharge: 2009-02-22 | Payer: Self-pay | Admitting: Orthopaedic Surgery

## 2009-02-22 IMAGING — CR DG SHOULDER 2+V*L*
3 series · 3 of 3 positions shown · non-contrast
Comparison: No priors

CLINICAL DATA: Chronic left shoulder pain

LEFT SHOULDER - 2+ VIEW

[w shoulder ap internal left]
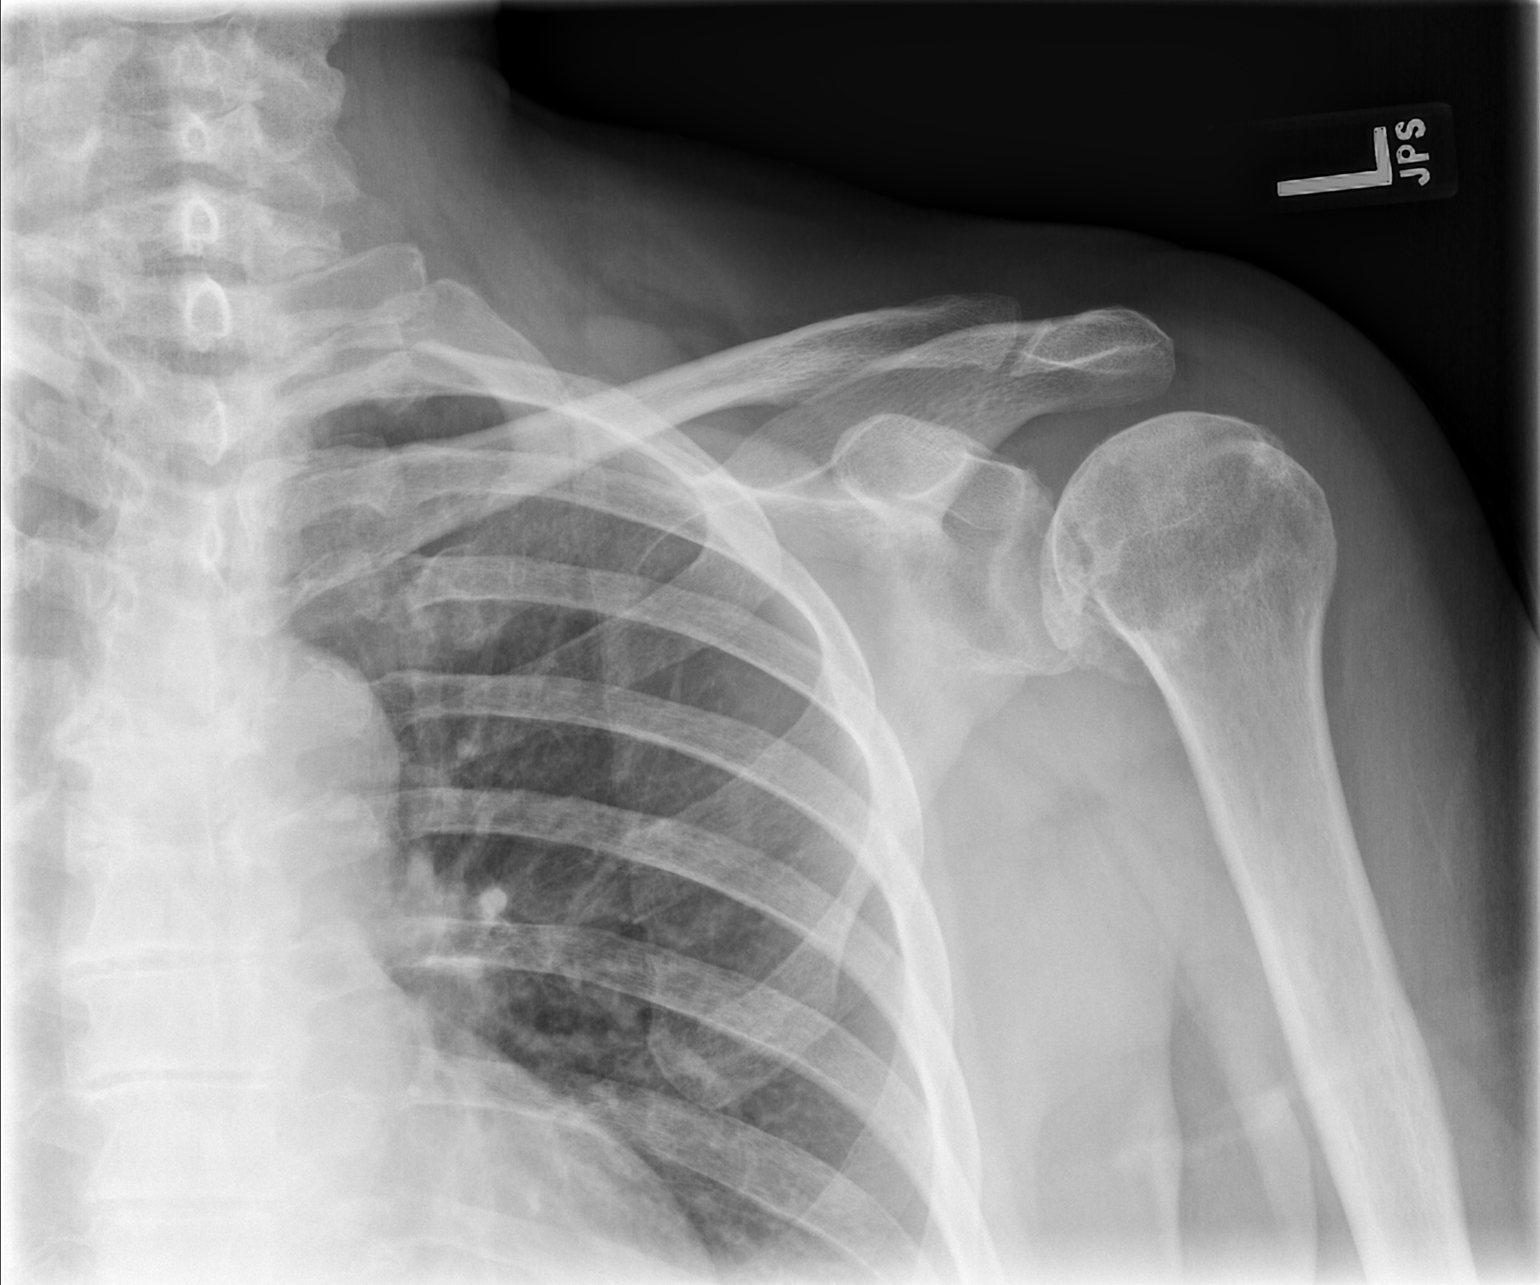

[w shoulder ap external left]
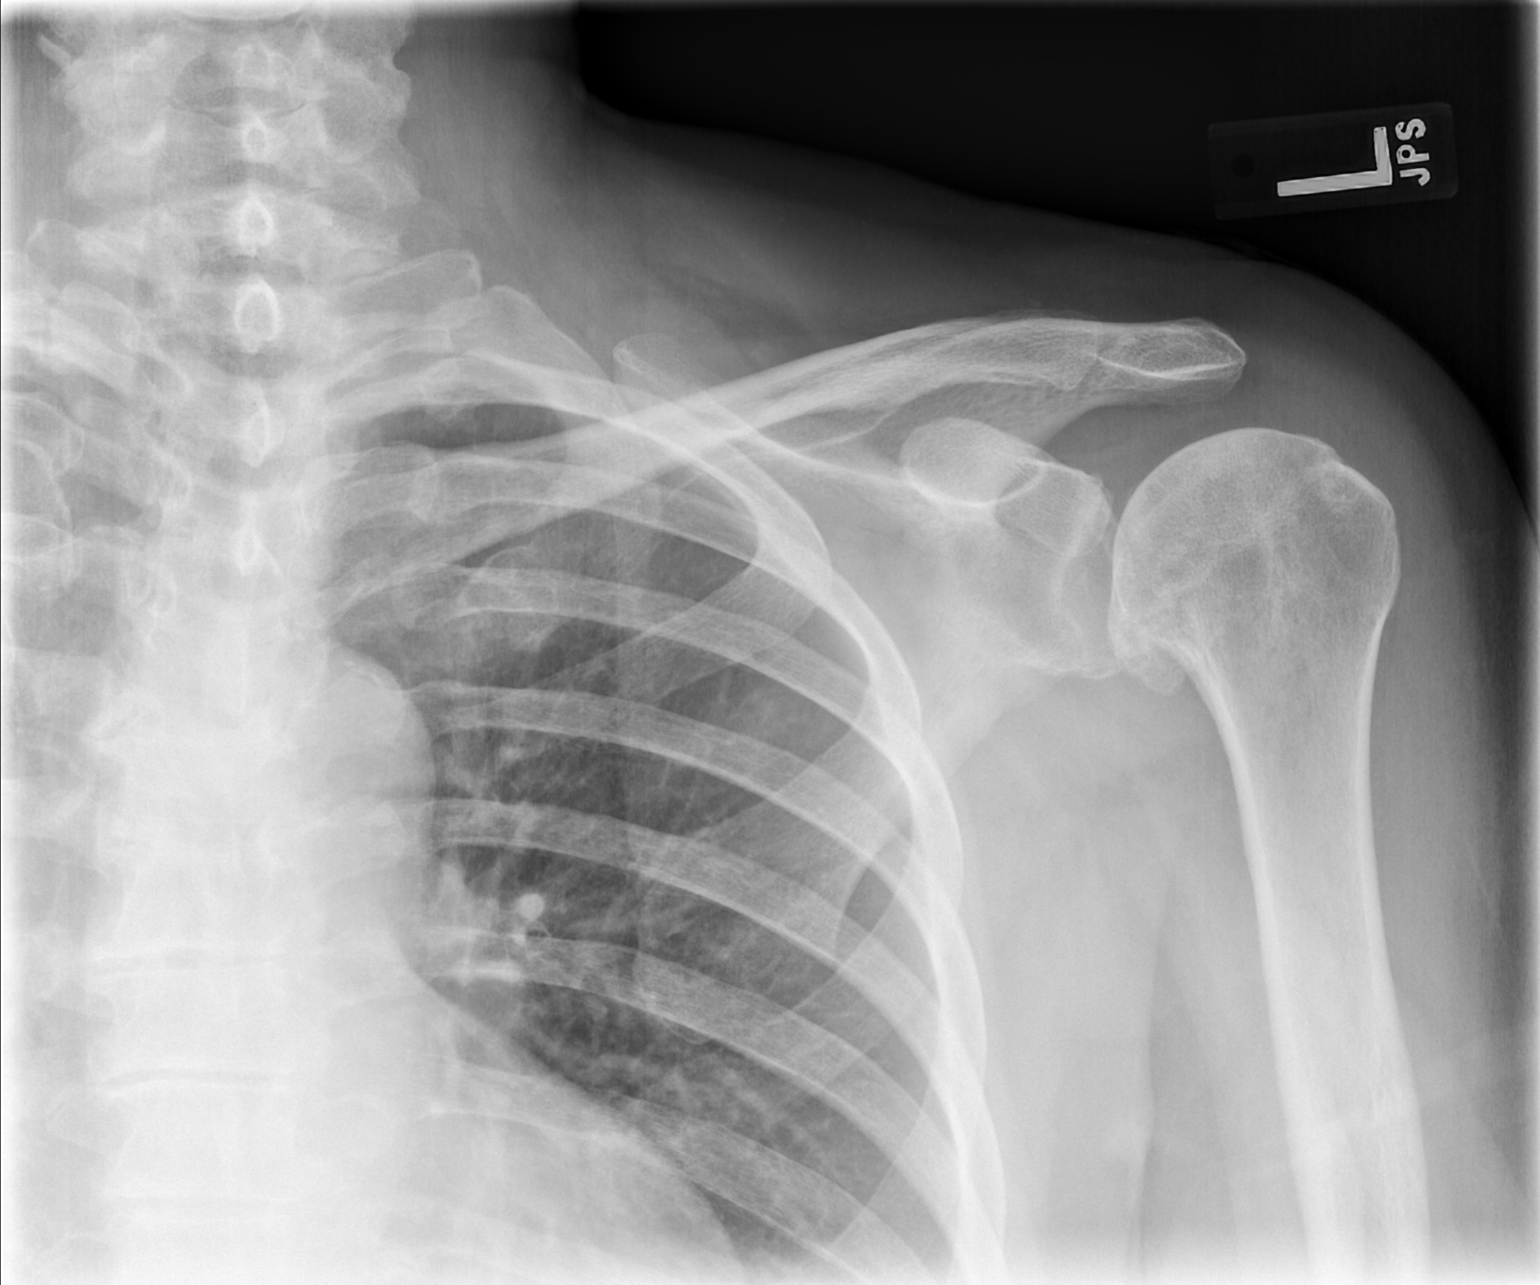

[w shoulder y view left]
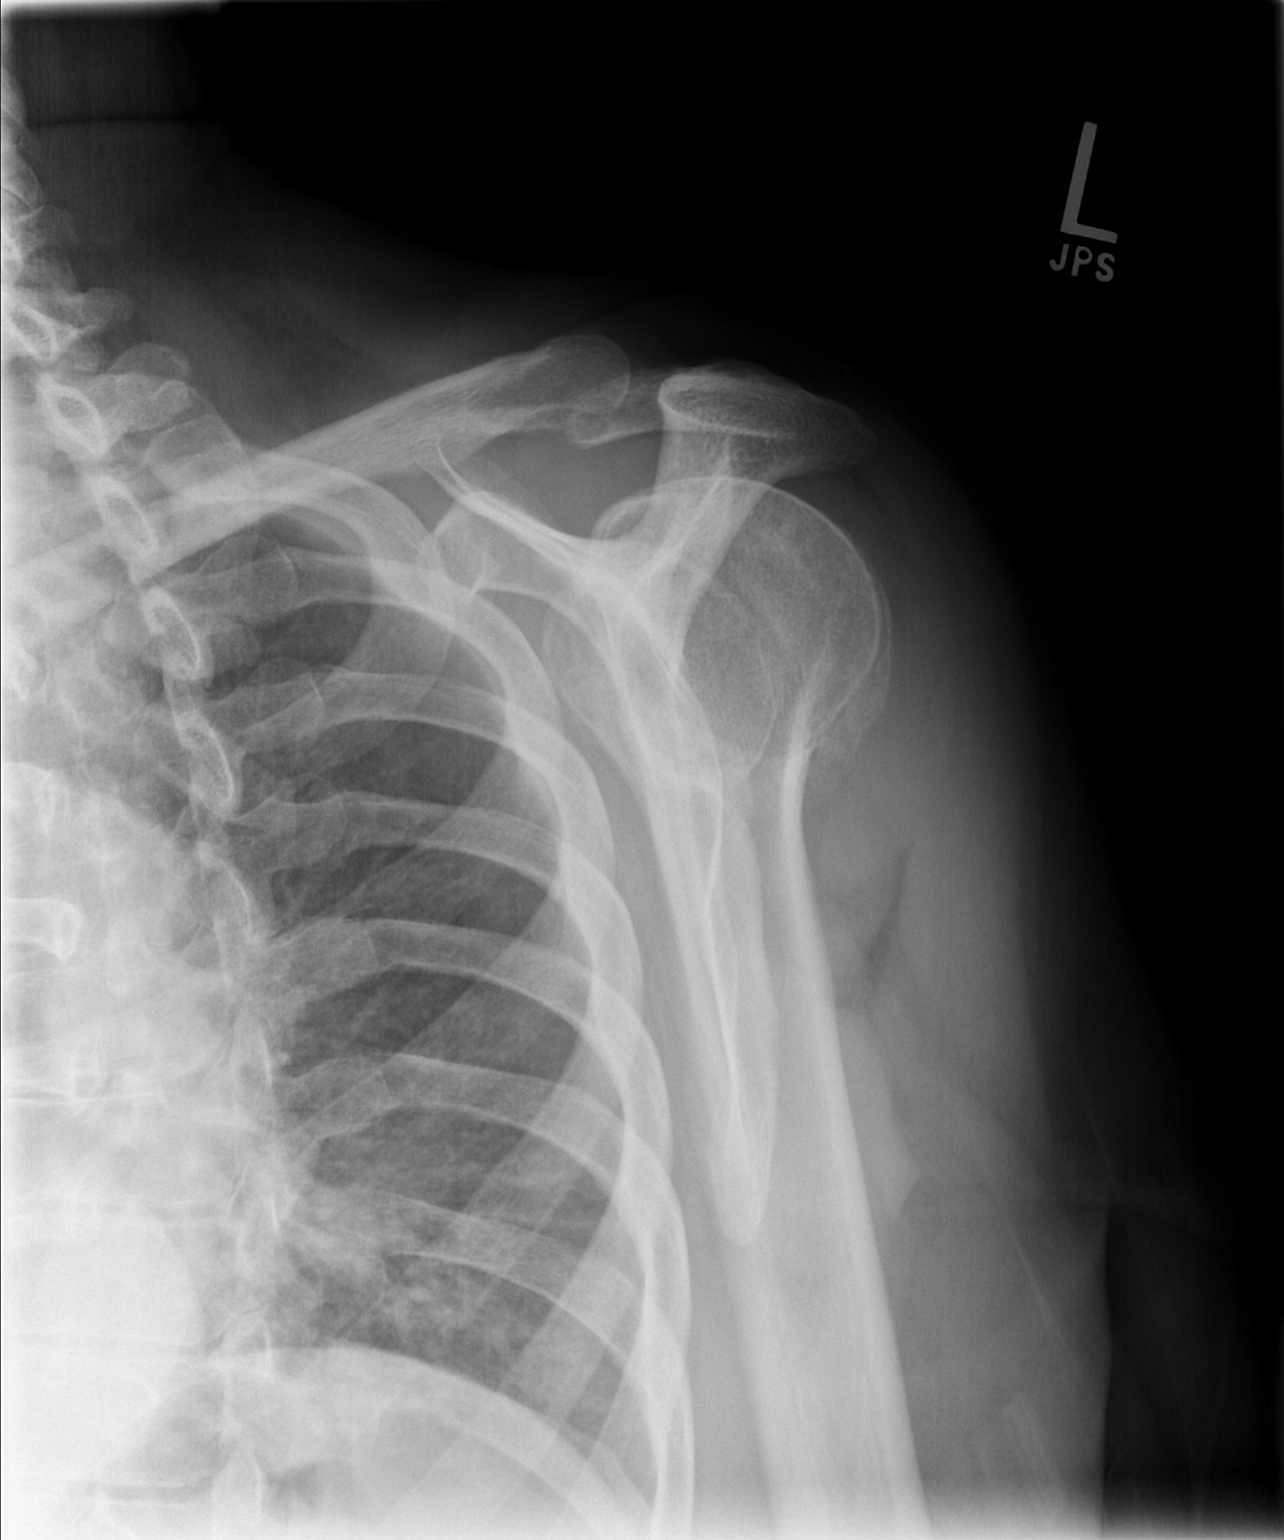

[3 of 3 positions shown; findings below may reference images not displayed]

FINDINGS: Prominent degenerative changes of the articular surface
of the humeral head with a prominent spur emanating off the
medial/inferior aspect of the humerus.  No fracture or dislocation.
No soft tissue abnormality.
IMPRESSION: Degenerative changes of the glenohumeral joint with prominent
humeral head spur.  No obvious acute abnormality.

## 2009-03-13 ENCOUNTER — Telehealth (INDEPENDENT_AMBULATORY_CARE_PROVIDER_SITE_OTHER): Payer: Self-pay | Admitting: *Deleted

## 2009-03-28 ENCOUNTER — Ambulatory Visit: Payer: Self-pay | Admitting: Internal Medicine

## 2009-03-28 LAB — CONVERTED CEMR LAB
AST: 17 units/L (ref 0–37)
Alkaline Phosphatase: 75 units/L (ref 39–117)
Total Bilirubin: 0.8 mg/dL (ref 0.3–1.2)
Total CHOL/HDL Ratio: 3
Triglycerides: 170 mg/dL — ABNORMAL HIGH (ref 0.0–149.0)

## 2009-03-31 ENCOUNTER — Ambulatory Visit: Payer: Self-pay | Admitting: Internal Medicine

## 2009-04-02 DIAGNOSIS — J309 Allergic rhinitis, unspecified: Secondary | ICD-10-CM

## 2009-04-02 DIAGNOSIS — J3089 Other allergic rhinitis: Secondary | ICD-10-CM

## 2009-04-02 DIAGNOSIS — J302 Other seasonal allergic rhinitis: Secondary | ICD-10-CM | POA: Insufficient documentation

## 2009-04-02 HISTORY — DX: Allergic rhinitis, unspecified: J30.9

## 2009-04-27 ENCOUNTER — Encounter: Payer: Self-pay | Admitting: Gynecology

## 2009-04-27 ENCOUNTER — Ambulatory Visit: Payer: Self-pay | Admitting: Gynecology

## 2009-04-27 ENCOUNTER — Other Ambulatory Visit: Admission: RE | Admit: 2009-04-27 | Discharge: 2009-04-27 | Payer: Self-pay | Admitting: Gynecology

## 2009-08-23 ENCOUNTER — Ambulatory Visit: Payer: Self-pay | Admitting: Internal Medicine

## 2009-08-23 DIAGNOSIS — R32 Unspecified urinary incontinence: Secondary | ICD-10-CM | POA: Insufficient documentation

## 2009-08-23 DIAGNOSIS — J019 Acute sinusitis, unspecified: Secondary | ICD-10-CM | POA: Insufficient documentation

## 2009-08-23 HISTORY — DX: Unspecified urinary incontinence: R32

## 2009-08-23 LAB — CONVERTED CEMR LAB
Bilirubin Urine: NEGATIVE
Ketones, ur: NEGATIVE mg/dL
Total Protein, Urine: NEGATIVE mg/dL
Urine Glucose: NEGATIVE mg/dL
pH: 6 (ref 5.0–8.0)

## 2009-08-24 ENCOUNTER — Encounter: Payer: Self-pay | Admitting: Internal Medicine

## 2009-10-04 ENCOUNTER — Ambulatory Visit: Payer: Self-pay | Admitting: Internal Medicine

## 2009-11-07 ENCOUNTER — Ambulatory Visit: Payer: Self-pay | Admitting: Internal Medicine

## 2009-11-07 DIAGNOSIS — R0789 Other chest pain: Secondary | ICD-10-CM | POA: Insufficient documentation

## 2009-11-07 HISTORY — DX: Other chest pain: R07.89

## 2009-11-09 ENCOUNTER — Encounter (INDEPENDENT_AMBULATORY_CARE_PROVIDER_SITE_OTHER): Payer: Self-pay | Admitting: *Deleted

## 2009-11-09 ENCOUNTER — Telehealth (INDEPENDENT_AMBULATORY_CARE_PROVIDER_SITE_OTHER): Payer: Self-pay | Admitting: *Deleted

## 2009-11-13 ENCOUNTER — Ambulatory Visit: Payer: Self-pay

## 2009-11-13 ENCOUNTER — Ambulatory Visit: Payer: Self-pay | Admitting: Cardiology

## 2009-11-13 ENCOUNTER — Telehealth: Payer: Self-pay | Admitting: Internal Medicine

## 2009-11-13 ENCOUNTER — Encounter (HOSPITAL_COMMUNITY): Admission: RE | Admit: 2009-11-13 | Discharge: 2009-12-06 | Payer: Self-pay | Admitting: Internal Medicine

## 2009-11-13 ENCOUNTER — Encounter: Payer: Self-pay | Admitting: Cardiology

## 2009-11-13 LAB — CONVERTED CEMR LAB
Basophils Relative: 0.6 % (ref 0.0–3.0)
Chloride: 106 meq/L (ref 96–112)
Eosinophils Relative: 1.7 % (ref 0.0–5.0)
HCT: 42.8 % (ref 36.0–46.0)
INR: 1 (ref 0.8–1.0)
Lymphs Abs: 3.7 10*3/uL (ref 0.7–4.0)
MCV: 93.3 fL (ref 78.0–100.0)
Monocytes Absolute: 0.7 10*3/uL (ref 0.1–1.0)
Monocytes Relative: 7.2 % (ref 3.0–12.0)
Neutrophils Relative %: 49.8 % (ref 43.0–77.0)
Potassium: 3.7 meq/L (ref 3.5–5.1)
RBC: 4.58 M/uL (ref 3.87–5.11)
WBC: 9.1 10*3/uL (ref 4.5–10.5)

## 2009-11-16 ENCOUNTER — Ambulatory Visit (HOSPITAL_COMMUNITY): Admission: RE | Admit: 2009-11-16 | Discharge: 2009-11-17 | Payer: Self-pay | Admitting: Cardiology

## 2009-11-16 ENCOUNTER — Ambulatory Visit: Payer: Self-pay | Admitting: Cardiology

## 2009-11-18 ENCOUNTER — Emergency Department (HOSPITAL_COMMUNITY): Admission: EM | Admit: 2009-11-18 | Discharge: 2009-11-18 | Payer: Self-pay | Admitting: Emergency Medicine

## 2009-11-18 ENCOUNTER — Telehealth: Payer: Self-pay | Admitting: Family Medicine

## 2009-11-18 IMAGING — CR DG CHEST 1V PORT
1 series · 1 of 1 positions shown · non-contrast
Comparison: [DATE].

CLINICAL DATA: Cardiac catheterization 3 days ago.  Chest pain and
shortness breath.  Cough.  Ex-smoker.

PORTABLE CHEST - 1 VIEW

[view not recorded]
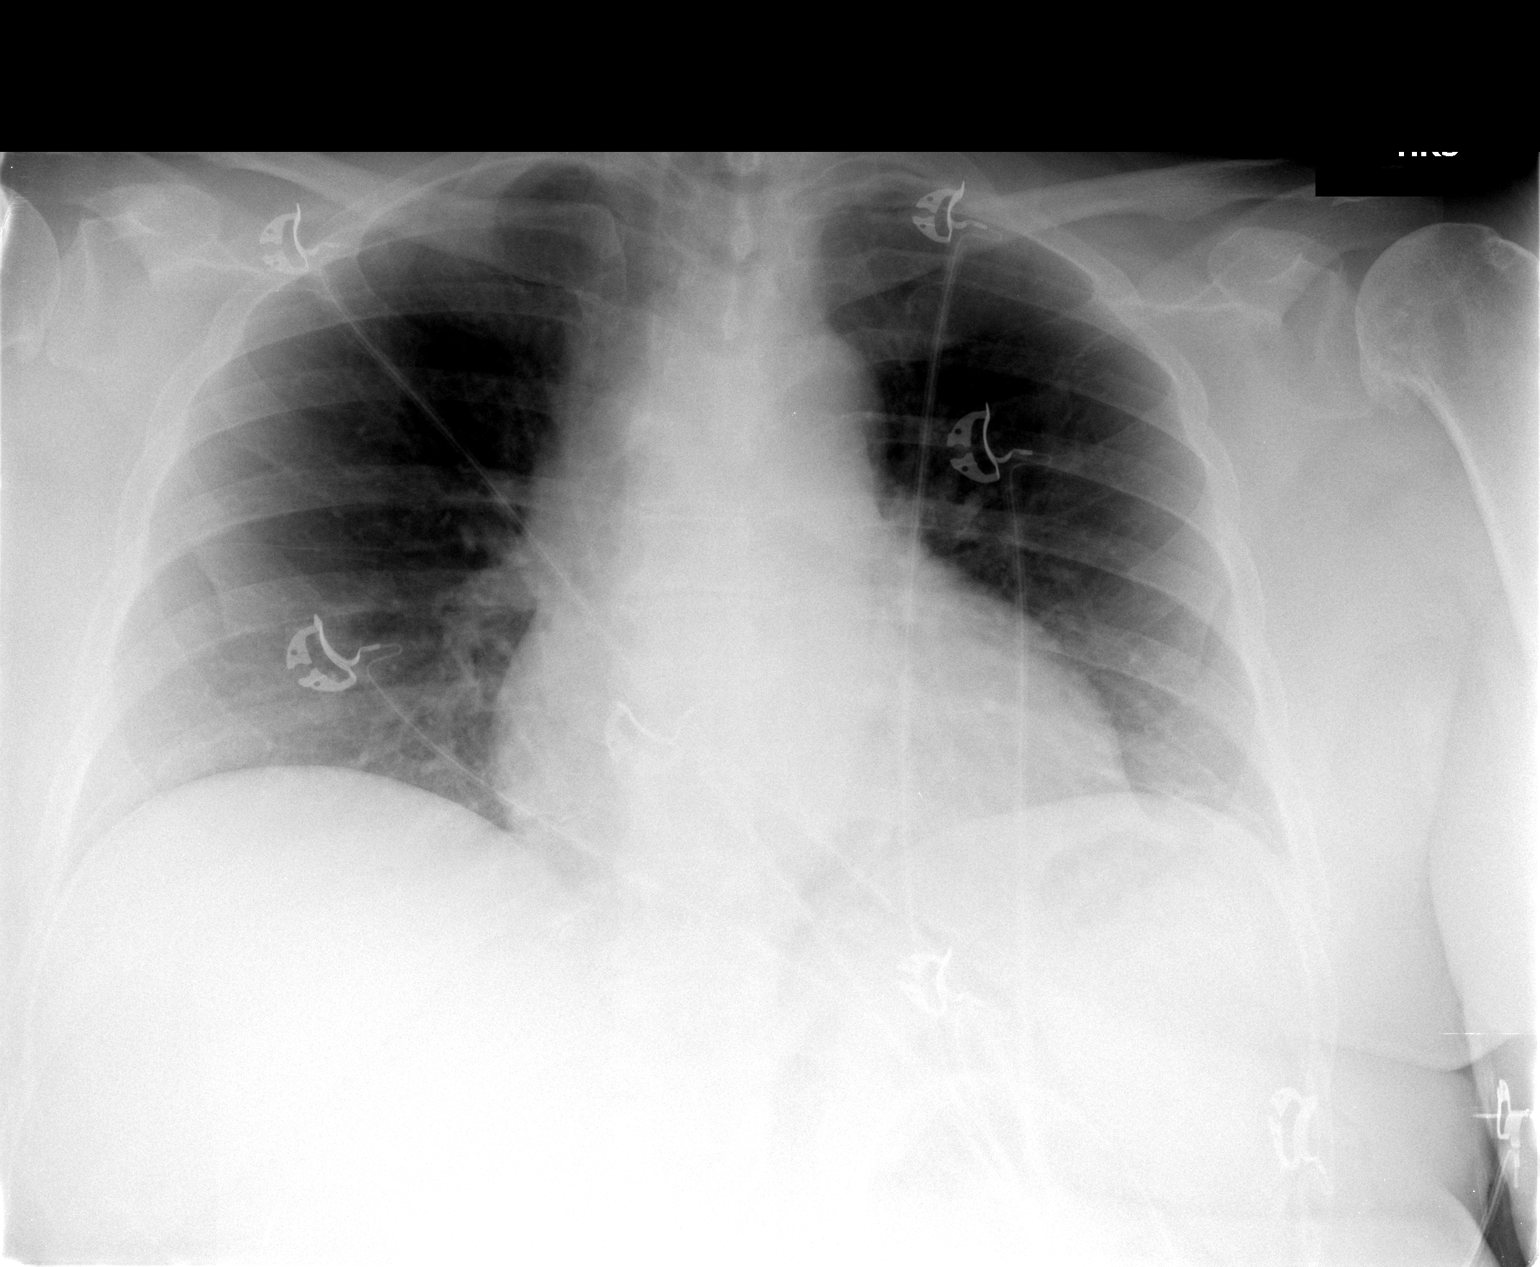

[1 of 1 positions shown; findings below may reference images not displayed]

FINDINGS: Heart size top normal.  Slightly tortuous aorta. No
infiltrate, congestive heart failure or pneumothorax. The patient
would eventually benefit from follow-up two-view chest with cardiac
leads removed.  Mild degenerative changes left shoulder.
IMPRESSION: No infiltrate, congestive heart failure or pneumothorax.

Mildly tortuous aorta.  Please see above discussion.

## 2009-12-25 ENCOUNTER — Telehealth: Payer: Self-pay | Admitting: Internal Medicine

## 2010-02-06 ENCOUNTER — Ambulatory Visit: Payer: Self-pay | Admitting: Internal Medicine

## 2010-02-06 DIAGNOSIS — R0609 Other forms of dyspnea: Secondary | ICD-10-CM

## 2010-02-06 DIAGNOSIS — R0989 Other specified symptoms and signs involving the circulatory and respiratory systems: Secondary | ICD-10-CM

## 2010-02-06 DIAGNOSIS — R519 Headache, unspecified: Secondary | ICD-10-CM | POA: Insufficient documentation

## 2010-02-06 DIAGNOSIS — R51 Headache: Secondary | ICD-10-CM | POA: Insufficient documentation

## 2010-02-06 DIAGNOSIS — J069 Acute upper respiratory infection, unspecified: Secondary | ICD-10-CM | POA: Insufficient documentation

## 2010-02-06 DIAGNOSIS — R350 Frequency of micturition: Secondary | ICD-10-CM | POA: Insufficient documentation

## 2010-02-06 HISTORY — DX: Other specified symptoms and signs involving the circulatory and respiratory systems: R06.09

## 2010-02-06 HISTORY — DX: Other specified symptoms and signs involving the circulatory and respiratory systems: R09.89

## 2010-02-06 LAB — CONVERTED CEMR LAB
ALT: 14 units/L (ref 0–35)
AST: 19 units/L (ref 0–37)
Albumin: 4.3 g/dL (ref 3.5–5.2)
BUN: 12 mg/dL (ref 6–23)
Basophils Relative: 0.7 % (ref 0.0–3.0)
Chloride: 108 meq/L (ref 96–112)
Cholesterol: 139 mg/dL (ref 0–200)
Eosinophils Relative: 1.2 % (ref 0.0–5.0)
HCT: 41.5 % (ref 36.0–46.0)
Hemoglobin: 13.9 g/dL (ref 12.0–15.0)
Leukocytes, UA: NEGATIVE
Lymphs Abs: 3 10*3/uL (ref 0.7–4.0)
MCV: 92.7 fL (ref 78.0–100.0)
Monocytes Absolute: 0.5 10*3/uL (ref 0.1–1.0)
Monocytes Relative: 5.5 % (ref 3.0–12.0)
Neutro Abs: 5.7 10*3/uL (ref 1.4–7.7)
Nitrite: NEGATIVE
Platelets: 275 10*3/uL (ref 150.0–400.0)
Potassium: 4.3 meq/L (ref 3.5–5.1)
RBC: 4.48 M/uL (ref 3.87–5.11)
Sed Rate: 16 mm/hr (ref 0–22)
Sodium: 142 meq/L (ref 135–145)
TSH: 2.22 microintl units/mL (ref 0.35–5.50)
Total Protein, Urine: NEGATIVE mg/dL
Total Protein: 7.2 g/dL (ref 6.0–8.3)
WBC: 9.4 10*3/uL (ref 4.5–10.5)
pH: 5 (ref 5.0–8.0)

## 2010-02-06 IMAGING — CR DG CHEST 2V
3 series · 3 of 3 positions shown · non-contrast
Comparison: [DATE]

CLINICAL DATA: Cough, shortness of breath

CHEST - 2 VIEW

[view not recorded (1 of 3)]
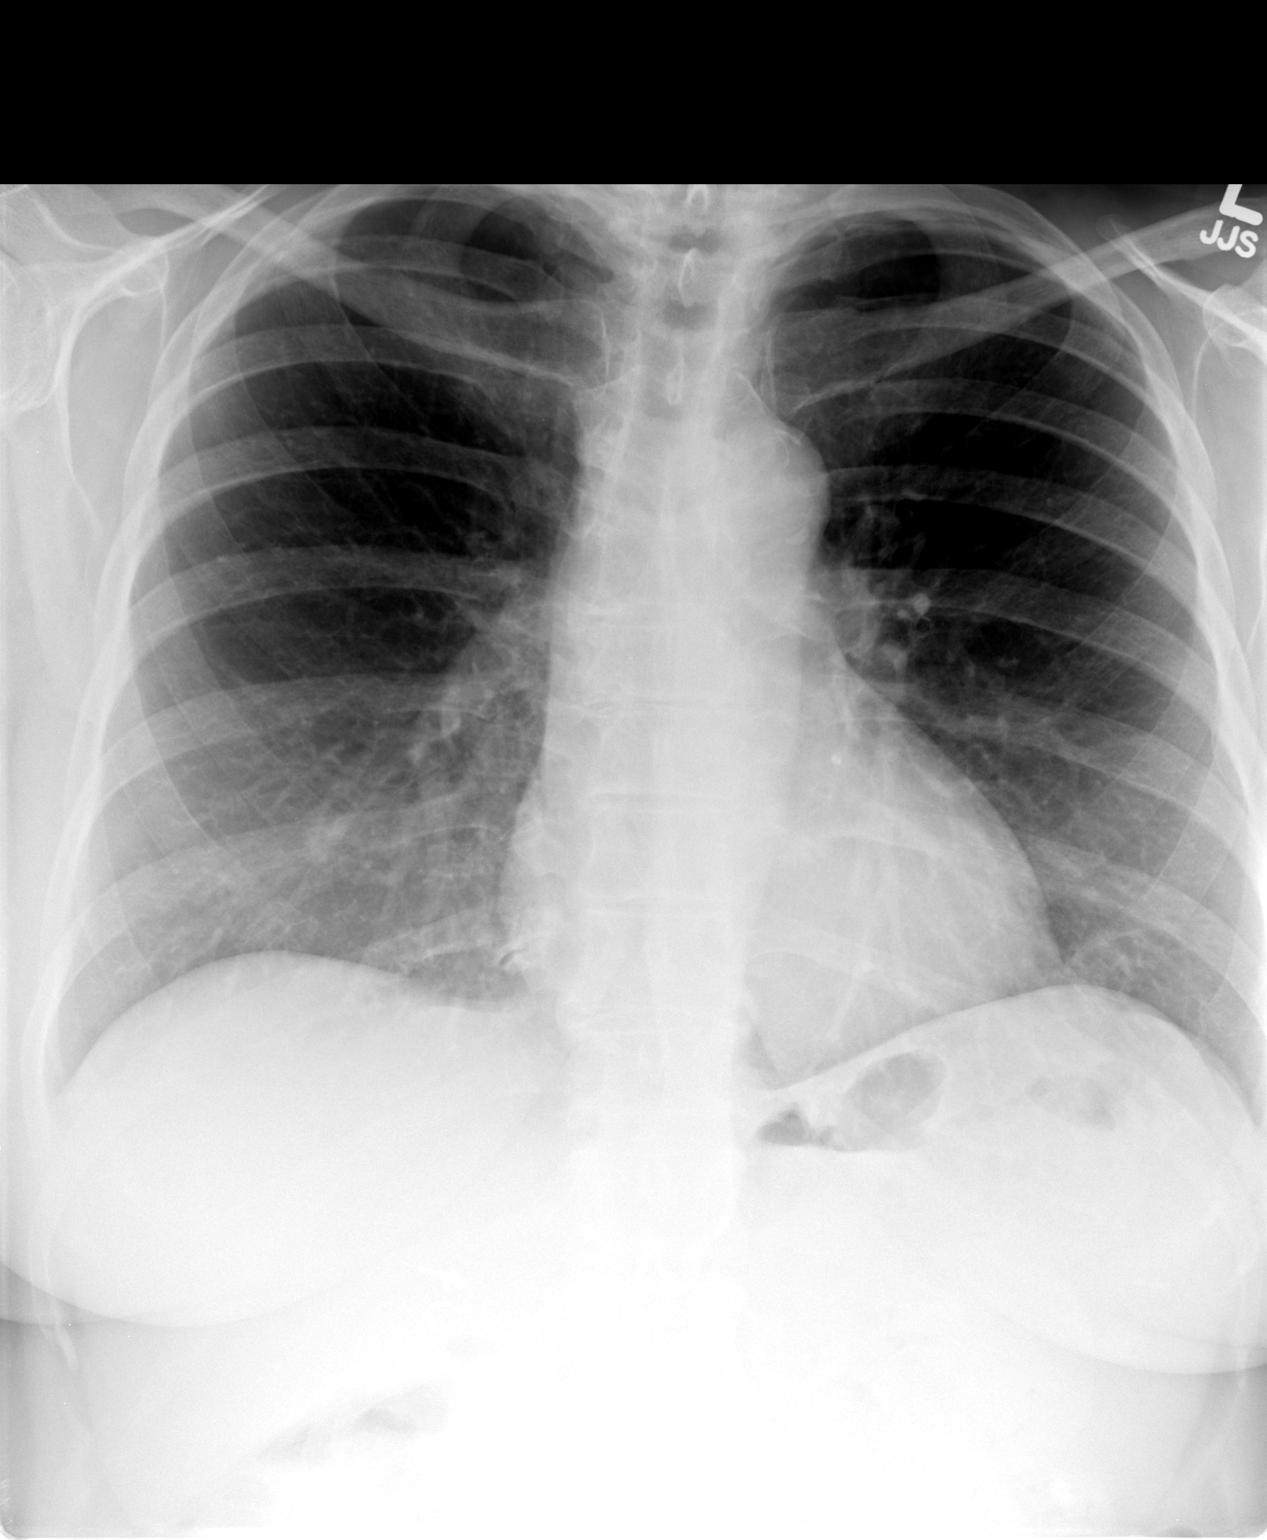

[view not recorded (2 of 3)]
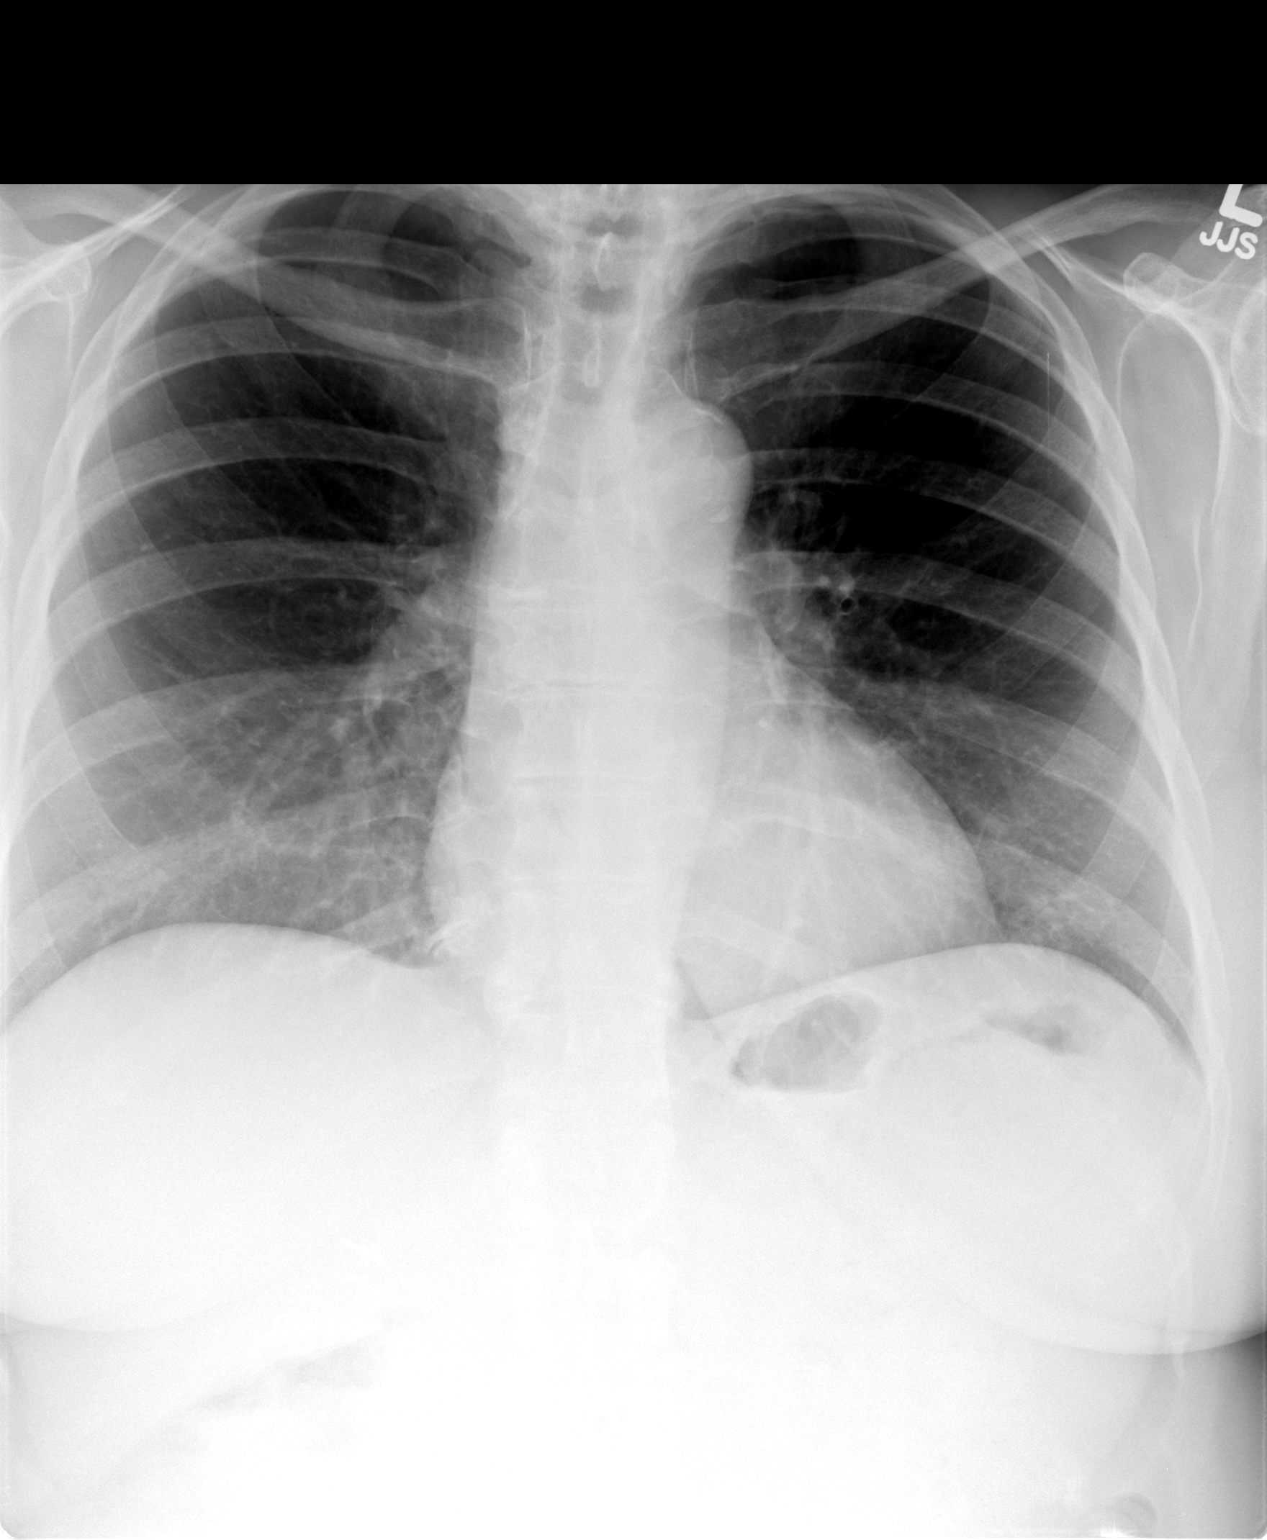

[view not recorded (3 of 3)]
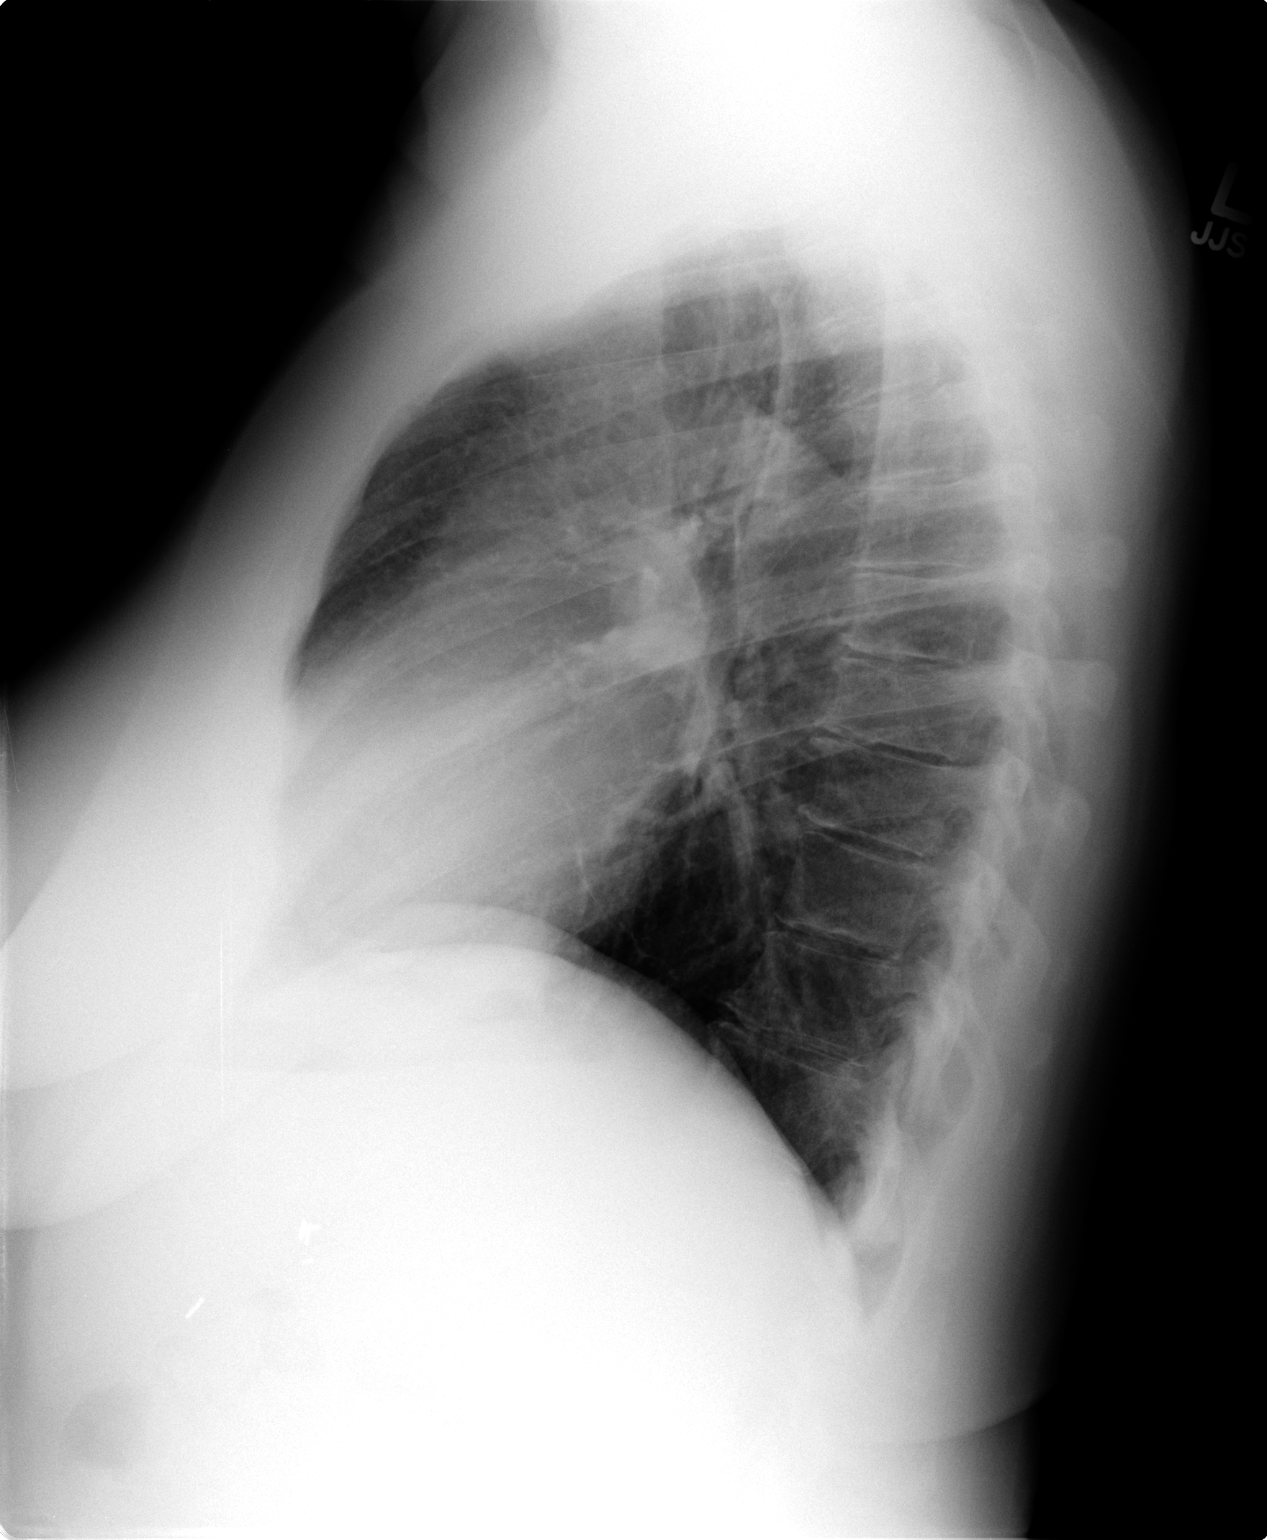

[3 of 3 positions shown; findings below may reference images not displayed]

FINDINGS: Vascular clips in the right upper abdomen. Lungs clear.
Heart size and pulmonary vascularity normal.  No effusion.
Visualized bones unremarkable.
IMPRESSION: No acute disease

## 2010-02-27 ENCOUNTER — Ambulatory Visit: Payer: Self-pay

## 2010-02-27 ENCOUNTER — Ambulatory Visit (HOSPITAL_COMMUNITY): Admission: RE | Admit: 2010-02-27 | Discharge: 2010-02-27 | Payer: Self-pay | Admitting: Internal Medicine

## 2010-02-27 ENCOUNTER — Encounter: Payer: Self-pay | Admitting: Internal Medicine

## 2010-02-27 ENCOUNTER — Ambulatory Visit: Payer: Self-pay | Admitting: Cardiology

## 2010-03-01 ENCOUNTER — Inpatient Hospital Stay (HOSPITAL_COMMUNITY): Admission: RE | Admit: 2010-03-01 | Discharge: 2010-03-09 | Payer: Self-pay | Admitting: Psychiatry

## 2010-03-01 ENCOUNTER — Ambulatory Visit: Payer: Self-pay | Admitting: Psychiatry

## 2010-05-08 ENCOUNTER — Ambulatory Visit: Payer: Self-pay | Admitting: Internal Medicine

## 2010-05-08 DIAGNOSIS — R062 Wheezing: Secondary | ICD-10-CM | POA: Insufficient documentation

## 2010-05-08 IMAGING — CR DG CHEST 2V
2 series · 2 of 2 positions shown · non-contrast
Comparison: Chest x-ray of [DATE]

CLINICAL DATA: Atypical chest pain, former smoker

CHEST - 2 VIEW

[view not recorded (1 of 2)]
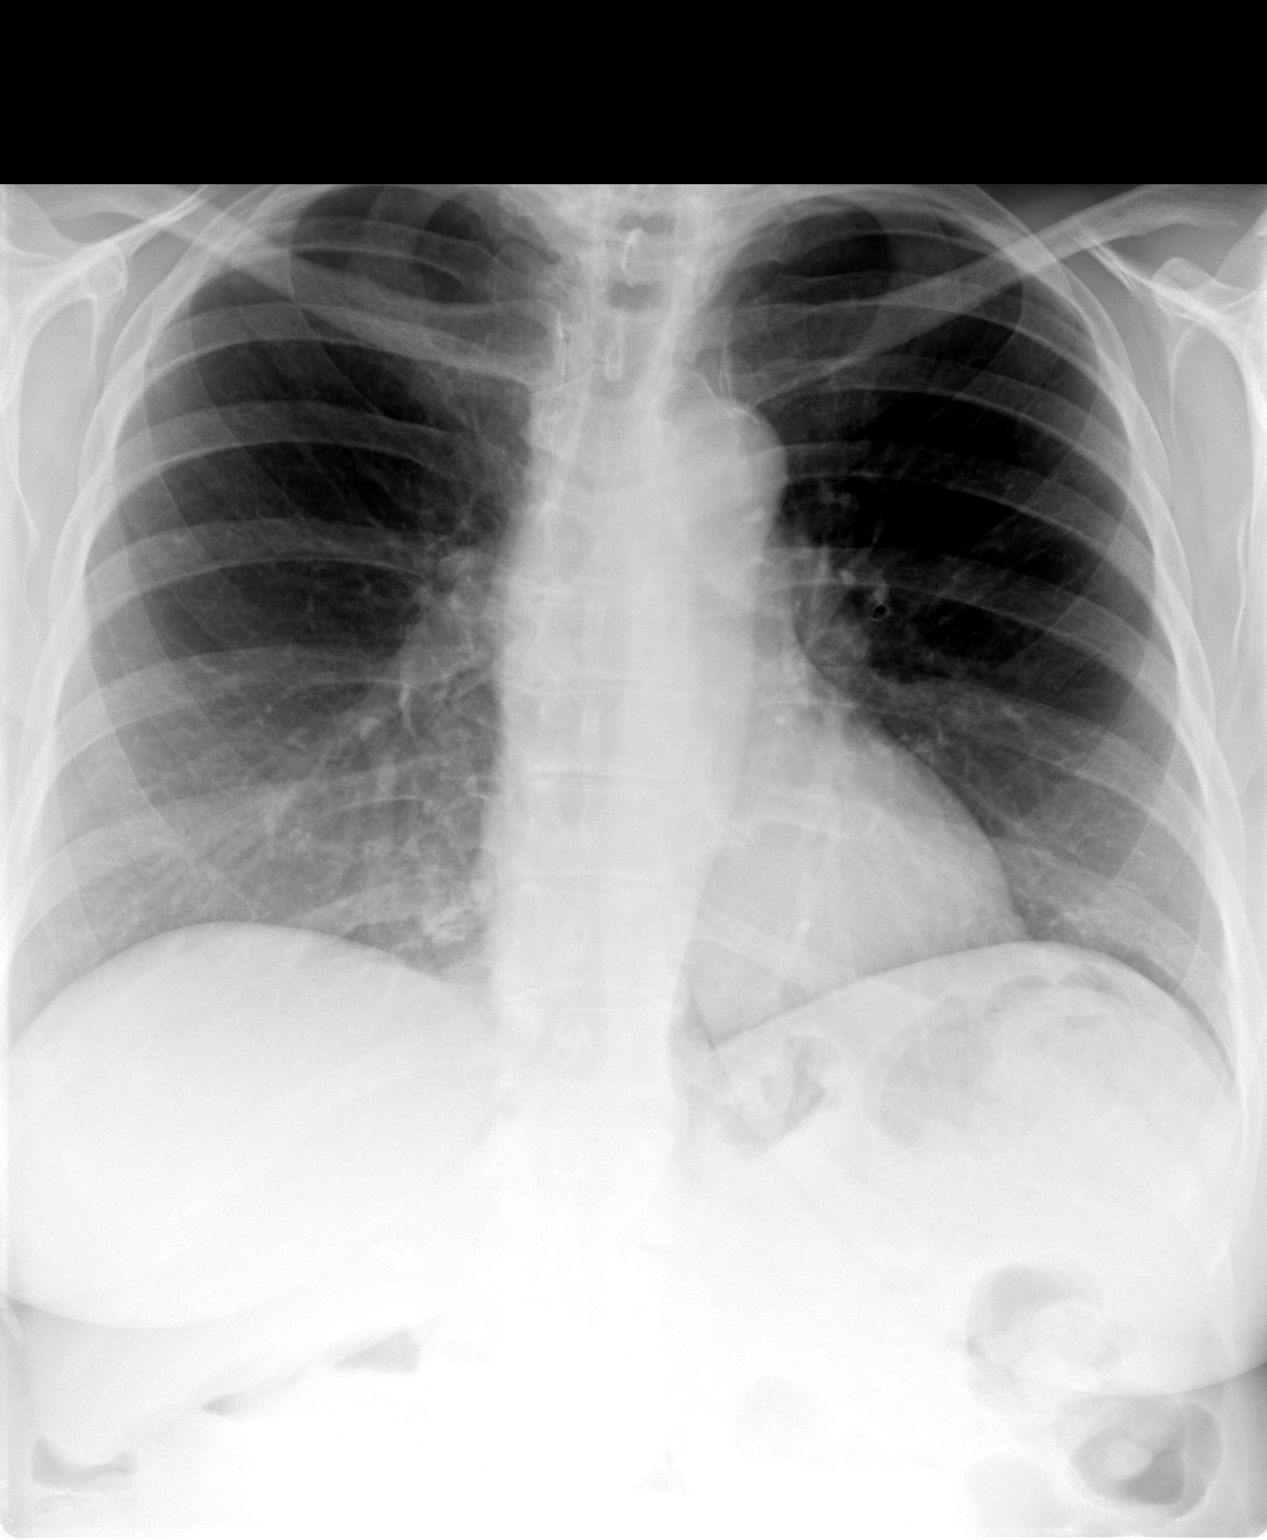

[view not recorded (2 of 2)]
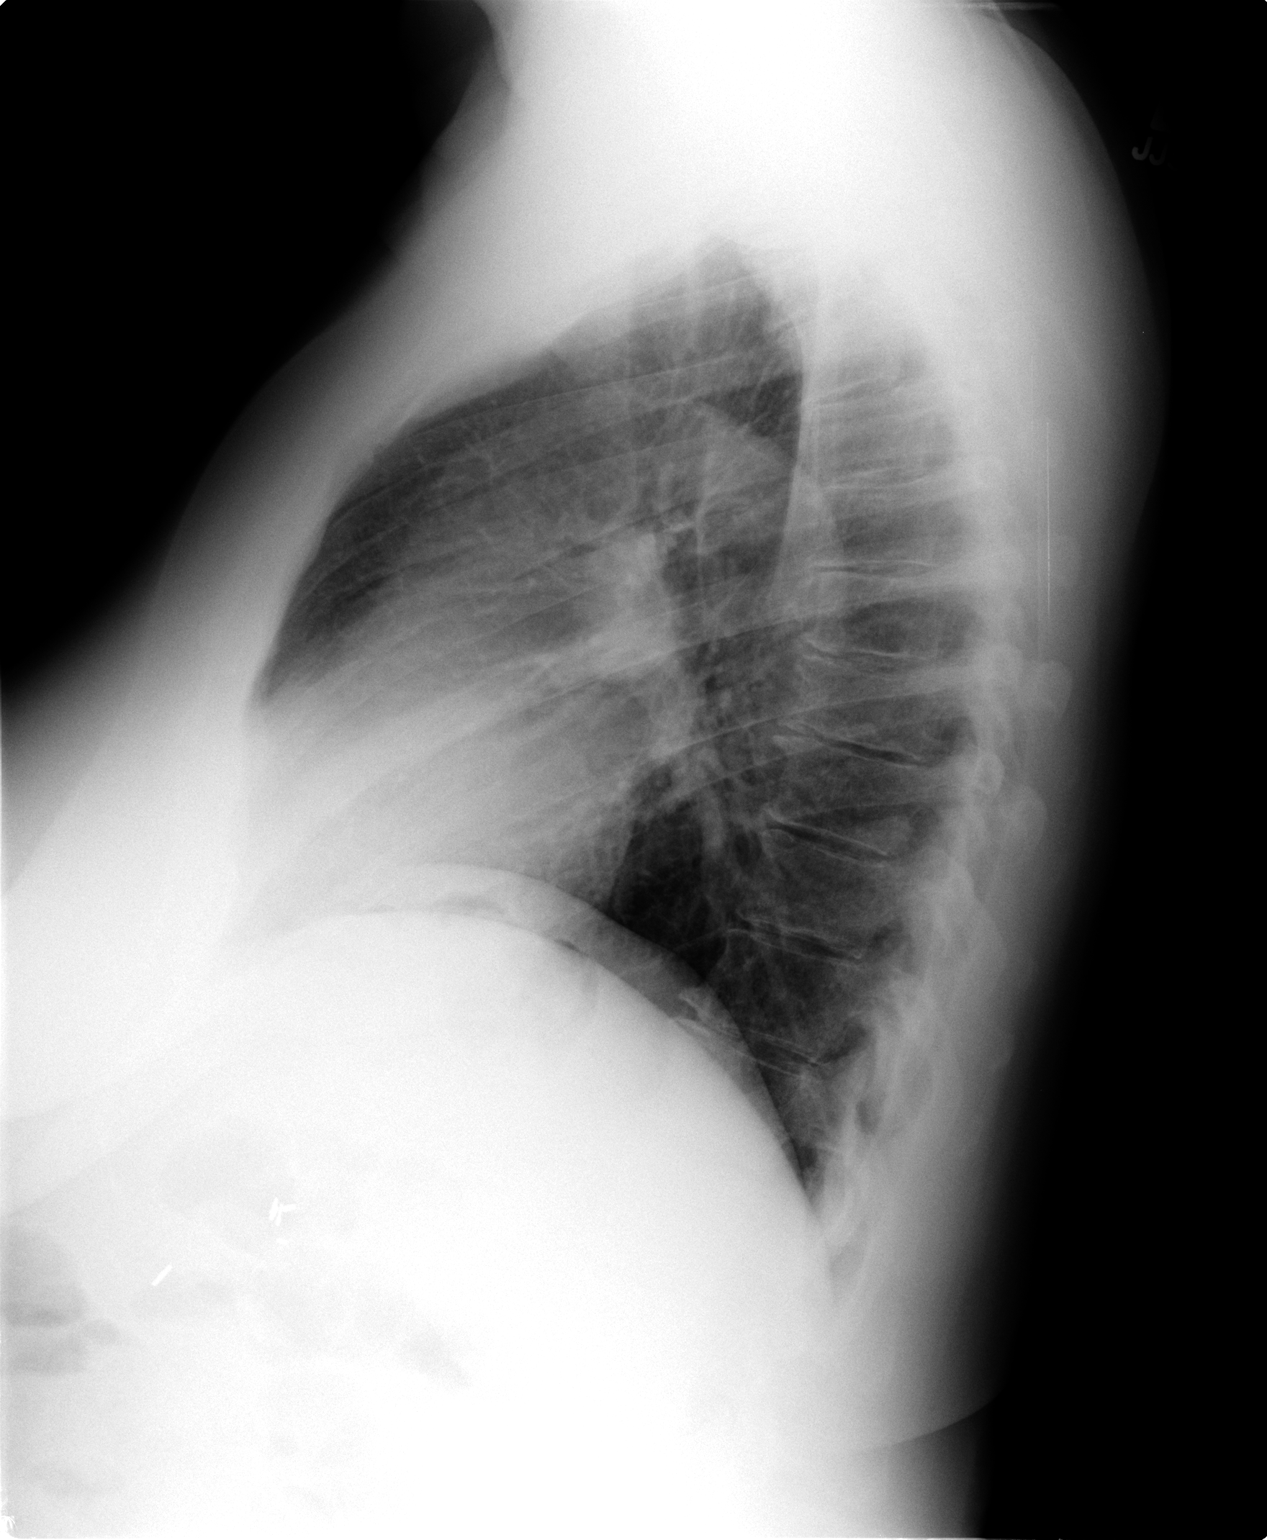

[2 of 2 positions shown; findings below may reference images not displayed]

FINDINGS: The lungs are clear.  Mediastinal contours are normal.
The heart is within upper limits of normal.  No acute bony
abnormality is seen.  Surgical clips are present in the right upper
quadrant from prior cholecystectomy.
IMPRESSION: No active lung disease.

## 2010-05-25 ENCOUNTER — Telehealth: Payer: Self-pay | Admitting: Internal Medicine

## 2010-08-14 ENCOUNTER — Ambulatory Visit: Payer: Self-pay | Admitting: Internal Medicine

## 2010-08-14 ENCOUNTER — Encounter: Payer: Self-pay | Admitting: Internal Medicine

## 2010-08-20 ENCOUNTER — Ambulatory Visit: Payer: Self-pay | Admitting: Psychiatry

## 2010-08-20 ENCOUNTER — Emergency Department (HOSPITAL_COMMUNITY): Admission: EM | Admit: 2010-08-20 | Discharge: 2010-08-21 | Payer: Self-pay | Admitting: Emergency Medicine

## 2010-08-21 ENCOUNTER — Inpatient Hospital Stay (HOSPITAL_COMMUNITY): Admission: AD | Admit: 2010-08-21 | Discharge: 2010-08-24 | Payer: Self-pay | Admitting: Psychiatry

## 2010-10-15 ENCOUNTER — Other Ambulatory Visit: Admission: RE | Admit: 2010-10-15 | Discharge: 2010-10-15 | Payer: Self-pay | Admitting: Gynecology

## 2010-10-15 ENCOUNTER — Ambulatory Visit: Payer: Self-pay | Admitting: Women's Health

## 2010-10-18 ENCOUNTER — Encounter: Payer: Self-pay | Admitting: Internal Medicine

## 2010-11-07 ENCOUNTER — Encounter: Payer: Self-pay | Admitting: Internal Medicine

## 2010-11-07 ENCOUNTER — Ambulatory Visit: Payer: Self-pay | Admitting: Gynecology

## 2010-11-12 ENCOUNTER — Ambulatory Visit: Payer: Self-pay | Admitting: Gynecology

## 2010-11-12 ENCOUNTER — Encounter: Payer: Self-pay | Admitting: Internal Medicine

## 2010-11-15 ENCOUNTER — Ambulatory Visit: Payer: Self-pay | Admitting: Internal Medicine

## 2010-11-20 ENCOUNTER — Ambulatory Visit: Payer: Self-pay | Admitting: Internal Medicine

## 2010-11-28 ENCOUNTER — Encounter
Admission: RE | Admit: 2010-11-28 | Discharge: 2010-12-05 | Payer: Self-pay | Source: Home / Self Care | Attending: Orthopedic Surgery | Admitting: Orthopedic Surgery

## 2010-12-29 ENCOUNTER — Encounter: Payer: Self-pay | Admitting: Gynecology

## 2010-12-29 ENCOUNTER — Encounter: Payer: Self-pay | Admitting: Specialist

## 2011-01-04 ENCOUNTER — Telehealth: Payer: Self-pay | Admitting: Internal Medicine

## 2011-01-08 NOTE — Progress Notes (Signed)
Summary: results  Phone Note Call from Patient Call back at Home Phone [phone]   Caller: Patient Summary of Call: pt called requesting results of stress test Initial call taken by: Margaret Pyle, CMA,  December 25, 2009 2:29 PM  Follow-up for Phone Call        dec 6 stress test - negative for any lack of blood flow Follow-up by: Corwin Levins MD,  December 25, 2009 2:59 PM  Additional Follow-up for Phone Call Additional follow up Details #1::        pt informed Additional Follow-up by: Margaret Pyle, CMA,  December 25, 2009 3:50 PM

## 2011-01-08 NOTE — Assessment & Plan Note (Signed)
Summary: SOB---STC   Vital Signs:  Patient profile:   65 year old female Height:      66.5 inches Weight:      202 pounds BMI:     32.23 O2 Sat:      97 % on Room air Temp:     97.4 degrees F oral Pulse rate:   79 / minute BP sitting:   120 / 80  (left arm) Cuff size:   large  Vitals Entered ByZella Ball Ewing (May 08, 2010 2:33 PM)  O2 Flow:  Room air CC: SOB, sinus congestion/RE   Primary Care Provider:  Corwin Levins MD  CC:  SOB and sinus congestion/RE.  History of Present Illness: here with acute onset x 3 days, mild to mod fever, ST, prod cough with greenish sputum, and today mild wheezing and sob;  also with sharp left lateral CP worse with deep breaths , cough and sneezing.  Pt denies other CP,  orthopnea, pnd, worsenidema, palps, dizziness or syncope .  Pt denies new neuro symptoms such as headache, facial or extremity weakness   No chills, or GU symptoms, flank pain.    Problems Prior to Update: 1)  Wheezing  (ICD-786.07) 2)  Bronchitis-acute  (ICD-466.0) 3)  Headache  (ICD-784.0) 4)  Dyspnea On Exertion  (ICD-786.09) 5)  Frequency, Urinary  (ICD-788.41) 6)  Uri  (ICD-465.9) 7)  Chest Discomfort, Atypical  (ICD-786.59) 8)  Urinary Incontinence  (ICD-788.30) 9)  Sinusitis- Acute-nos  (ICD-461.9) 10)  Allergic Rhinitis  (ICD-477.9) 11)  Shoulder Pain, Left  (ICD-719.41) 12)  Inguinal Pain, Right  (ICD-789.09) 13)  Abdominal Pain, Lower  (ICD-789.09) 14)  Otitis Media, Acute, Left  (ICD-382.9) 15)  Foot Pain, Right  (ICD-729.5) 16)  Preventive Health Care  (ICD-V70.0) 17)  Back Pain  (ICD-724.5) 18)  Constipation  (ICD-564.00) 19)  Otitis Media, Acute, Left  (ICD-382.9) 20)  Hyperlipidemia  (ICD-272.4) 21)  Bunions, Bilateral  (ICD-727.1) 22)  Family History Diabetes 1st Degree Relative  (ICD-V18.0) 23)  Family History of Cad Female 1st Degree Relative <50  (ICD-V17.3) 24)  Preventive Health Care  (ICD-V70.0) 25)  Family History of Colon Ca 1st Degree Relative  <60  (ICD-V16.0) 26)  Symptom, Palpitations  (ICD-785.1) 27)  Osteoarthrosis Nos, Lower Leg  (ICD-715.96) 28)  Colonic Polyps, Hx of  (ICD-V12.72) 29)  Depression  (ICD-311)  Medications Prior to Update: 1)  Clonazepam 1 Mg  Tabs (Clonazepam) .... 1/2  - 1 By Mouth Three Times A Day Prn 2)  Naprosyn 500 Mg  Tabs (Naproxen) .[name].. 1 By Mouth Two Times A Day Prn 3)  Crestor 40 Mg  Tabs (Rosuvastatin Calcium) .... 1/2 By Mouth Once Daily 4)  Adult Aspirin Low Strength 81 Mg  Tbdp (Aspirin) .[name].. 1po Qd 5)  Hydrocodone-Acetaminophen 7.5-325 Mg Tabs (Hydrocodone-Acetaminophen) .[name].. 1 By Mouth Q 6 Hrs As Needed Pain 6)  Atenolol 25 Mg Tabs (Atenolol) .[name].. 1 By Mouth Once Daily 7)  Pristiq 100 Mg Xr24h-Tab (Desvenlafaxine Succinate) .... Take 1 By Mouth Qd 8)  Trazodone Hcl 100 Mg Tabs (Trazodone Hcl) .[name].. 1 By Mouth At Bedtime 9)  Meclizine Hcl 12.5 Mg Tabs (Meclizine Hcl) .[name].. 1 - 2 By Mouth Q 6 Hrs As Needed Dizziness 10)  Ciprofloxacin Hcl 500 Mg Tabs (Ciprofloxacin Hcl) .[name].. 1po Two Times A Day  Current Medications (verified): 1)  Clonazepam 1 Mg  Tabs (Clonazepam) .... 1/2  - 1 By Mouth Three Times A Day Prn 2)  Naprosyn 500  Mg  Tabs (Naproxen) .[name].. 1 By Mouth Two Times A Day Prn 3)  Crestor 40 Mg  Tabs (Rosuvastatin Calcium) .... 1/2 By Mouth Once Daily 4)  Adult Aspirin Low Strength 81 Mg  Tbdp (Aspirin) .[name].. 1po Qd 5)  Hydrocodone-Acetaminophen 7.5-325 Mg Tabs (Hydrocodone-Acetaminophen) .[name].. 1 By Mouth Q 6 Hrs As Needed Pain 6)  Atenolol 25 Mg Tabs (Atenolol) .[name].. 1 By Mouth Once Daily 7)  Pristiq 100 Mg Xr24h-Tab (Desvenlafaxine Succinate) .... Take 1 By Mouth Qd 8)  Trazodone Hcl 100 Mg Tabs (Trazodone Hcl) .... 3 By Mouth At Bedtime 9)  Meclizine Hcl 12.5 Mg Tabs (Meclizine Hcl) .[name].. 1 - 2 By Mouth Q 6 Hrs As Needed Dizziness 10)  Ciprofloxacin Hcl 500 Mg Tabs (Ciprofloxacin Hcl) .[name].. 1po Two Times A Day 11)  Neurontin 300 Mg Caps (Gabapentin) .[name].. 1 By Mouth Three Times A Day 12)   Buspirone Hcl 5 Mg Tabs (Buspirone Hcl) .[name].. 1 By Mouth Three Times A Day 13)  Liothyronine Sodium 25 Mcg Tabs (Liothyronine Sodium) .[name].. 1 By Mouth Two Times A Day 14)  Cephalexin 500 Mg Caps (Cephalexin) .[name].. 1 By Mouth Three Times A Day 15)  Tessalon Perles 100 Mg Caps (Benzonatate) .[name].. 1-2 By Mouth Three Times A Day As Needed Cough 16)  Prednisone 10 Mg Tabs (Prednisone) .... 4po Qd For 3days, Then 3po Qd For 3days, Then 2po Qd For 3days, Then 1po Qd For 3 Days, Then Stop 17)  Proair Hfa 108 (90 Base) Mcg/act Aers (Albuterol Sulfate) .... 2 Puffs Four Times Per Day As Needed  Allergies (verified): 1)  ! * Abilify 2)  ! Zocor  Past History:  Past Medical History: Last updated: 11/13/2009 Depression Colonic polyps, hx of DJD- left knee Hyperlipidemia x 1 year Chronic LBP  - dr Otelia Sergeant Allergic rhinitis SVT (documented at ER visits requiring treatment with adenosine. Last event 2008.)  Past Surgical History: Last updated: 09/13/2007 Cholecystectomy Hysterectomy Lumbar laminectomy  Social History: Last updated: 11/13/2009 Former Smoker (Smoked as a teenager.  Quit 1970. Alcohol use-no Disabled - psych Married 2 kids  Risk Factors: Smoking Status: quit (09/13/2007)  Review of Systems       all otherwise negative per pt -    Physical Exam  General:  alert and overweight-appearing.   Head:  normocephalic and atraumatic.   Eyes:  vision grossly intact, pupils equal, and pupils round.   Ears:  bilat tm's mild red, sinus nontender Nose:  nasal dischargemucosal pallor and mucosal edema.   Mouth:  pharyngeal erythema and fair dentition.   Neck:  supple and cervical lymphadenopathy.   Lungs:  normal respiratory effort, R decreased breath sounds, R wheezes, L decreased breath sounds, and L wheezes.   Heart:  normal rate and regular rhythm.   Extremities:  no edema, no erythema    Impression & Recommendations:  Problem # 1:  BRONCHITIS-ACUTE (ICD-466.0)  The  following medications were removed from the medication list:    Ciprofloxacin Hcl 500 Mg Tabs (Ciprofloxacin hcl) .[name]... 1po two times a day Her updated medication list for this problem includes:    Cephalexin 500 Mg Caps (Cephalexin) .[name]... 1 by mouth three times a day    Tessalon Perles 100 Mg Caps (Benzonatate) .[name]... 1-2 by mouth three times a day as needed cough    Proair Hfa 108 (90 Base) Mcg/act Aers (Albuterol sulfate) .[name]... 2 puffs four times per day as needed  Orders: Depo- Medrol 40mg  (J1030) Depo- Medrol 80mg  (J1040) Admin  of Therapeutic Inj  intramuscular or subcutaneous (08657) treat as above, f/u any worsening signs or symptoms   Problem # 2:  WHEEZING (ICD-786.07) likely due to above,  for depo shot today, and prednisone pack   Problem # 3:  CHEST DISCOMFORT, ATYPICAL (ICD-786.59)  c/w pleurisy - for cxr   Orders: T-2 View CXR, Same Day (71020.5TC)  Complete Medication List: 1)  Clonazepam 1 Mg Tabs (Clonazepam) .... 1/2  - 1 by mouth three times a day prn 2)  Naprosyn 500 Mg Tabs (Naproxen) .[name].. 1 by mouth two times a day prn 3)  Crestor 40 Mg Tabs (Rosuvastatin calcium) .... 1/2 by mouth once daily 4)  Adult Aspirin Low Strength 81 Mg Tbdp (Aspirin) .[name].. 1po qd 5)  Hydrocodone-acetaminophen 7.5-325 Mg Tabs (Hydrocodone-acetaminophen) .[name].. 1 by mouth q 6 hrs as needed pain 6)  Atenolol 25 Mg Tabs (Atenolol) .[name].. 1 by mouth once daily 7)  Pristiq 100 Mg Xr24h-tab (Desvenlafaxine succinate) .... Take 1 by mouth qd 8)  Trazodone Hcl 100 Mg Tabs (Trazodone hcl) .... 3 by mouth at bedtime 9)  Meclizine Hcl 12.5 Mg Tabs (Meclizine hcl) .[name].. 1 - 2 by mouth q 6 hrs as needed dizziness 10)  Neurontin 300 Mg Caps (Gabapentin) .[name].. 1 by mouth three times a day 11)  Buspirone Hcl 5 Mg Tabs (Buspirone hcl) .[name].. 1 by mouth three times a day 12)  Liothyronine Sodium 25 Mcg Tabs (Liothyronine sodium) .[name].. 1 by mouth two times a day 13)  Cephalexin 500 Mg Caps (Cephalexin) .[name].. 1 by  mouth three times a day 14)  Tessalon Perles 100 Mg Caps (Benzonatate) .[name].. 1-2 by mouth three times a day as needed cough 15)  Prednisone 10 Mg Tabs (Prednisone) .... 4po qd for 3days, then 3po qd for 3days, then 2po qd for 3days, then 1po qd for 3 days, then stop 16)  Proair Hfa 108 (90 Base) Mcg/act Aers (Albuterol sulfate) .... 2 puffs four times per day as needed  Patient Instructions: 1)  you had the steroid shot today 2)  Please take all new medications as prescribed - the antibiotic, cough med, and prednisone, and inhaler 3)  Continue all previous medications as before this visit  4)  Please go to Radiology in the basement level for your X-Ray today  5)  Please schedule a follow-up appointment as needed. Prescriptions: PROAIR HFA 108 (90 BASE) MCG/ACT AERS (ALBUTEROL SULFATE) 2 puffs four times per day as needed  #1 x 2   Entered and Authorized by:   Corwin Levins MD   Signed by:   Corwin Levins MD on 05/08/2010   Method used:   Print then Give to Patient   RxID:   [id number] PREDNISONE 10 MG TABS (PREDNISONE) 4po qd for 3days, then 3po qd for 3days, then 2po qd for 3days, then 1po qd for 3 days, then stop  #30 x 0   Entered and Authorized by:   Corwin Levins MD   Signed by:   Corwin Levins MD on 05/08/2010   Method used:   Print then Give to Patient   RxID:   [id number] TESSALON PERLES 100 MG CAPS (BENZONATATE) 1-2 by mouth three times a day as needed cough  #60 x 1   Entered and Authorized by:   Corwin Levins MD   Signed by:   Corwin Levins MD on 05/08/2010   Method used:   Print then Give to Patient   RxID:  [id number] CEPHALEXIN 500 MG CAPS (CEPHALEXIN) 1 by mouth three times a day  #30 x 0   Entered and Authorized by:   Corwin Levins MD   Signed by:   Corwin Levins MD on 05/08/2010   Method used:   Print then Give to Patient   RxID:   [id number]    Medication Administration  Injection # 1:    Medication: Depo- Medrol 40mg     Diagnosis:  BRONCHITIS-ACUTE (ICD-466.0)    Route: IM    Site: LUOQ gluteus    Exp Date: 03/2013    Lot #: [id number]    Mfr: [employer]    Given byZella Ball Ewing (May 08, 2010 3:23 PM)  Injection # 2:    Medication: Depo- Medrol 80mg     Diagnosis: BRONCHITIS-ACUTE (ICD-466.0)    Route: IM    Site: LUOQ gluteus    Exp Date: 03/2013    Lot #: [id number]    Mfr: [employer]    Given byZella Ball Ewing (May 08, 2010 3:23 PM)  Orders Added: 1)  Depo- Medrol 40mg  [J1030] 2)  Depo- Medrol 80mg  [J1040] 3)  Admin of Therapeutic Inj  intramuscular or subcutaneous [96372] 4)  T-2 View CXR, Same Day [71020.5TC] 5)  Est. Patient Level IV [84696]

## 2011-01-08 NOTE — Progress Notes (Signed)
Summary: ABX refill?  Phone Note Call from Patient Call back at [facility] Phone [phone]   Caller: Patient Summary of Call: Pt called stating that she is still having ST, congestion with mild cough and ear pressure. Pt is requesting refill on ABX? Initial call taken by: Margaret Pyle, CMA,  May 25, 2010 11:26 AM  Follow-up for Phone Call        ok for zpack as per emr Follow-up by: Corwin Levins MD,  May 25, 2010 1:09 PM  Additional Follow-up for Phone Call Additional follow up Details #1::        left detailed vm on hm # Additional Follow-up by: Lamar Sprinkles, CMA,  May 25, 2010 2:19 PM    New/Updated Medications: AZITHROMYCIN 250 MG TABS (AZITHROMYCIN) 2po qd for 1 day, then 1po qd for 4days, then stop Prescriptions: AZITHROMYCIN 250 MG TABS (AZITHROMYCIN) 2po qd for 1 day, then 1po qd for 4days, then stop  #6 x 1   Entered and Authorized by:   Corwin Levins MD   Signed by:   Corwin Levins MD on 05/25/2010   Method used:   Electronically to        [facility]  [phone]* (retail)       [address]       [place], [place]  [address]       Ph: [phone] or [phone]       Fax: [phone]   RxID:   [id number]

## 2011-01-08 NOTE — Assessment & Plan Note (Signed)
Summary: ALLERGIES/ PAIN SUNDAY UP LEFT ARM INTO HEART/ NONE TODAY/NWS   Vital Signs:  Patient profile:   65 year old female Height:      66.5 inches Weight:      205.50 pounds BMI:     32.79 O2 Sat:      97 % on Room air Temp:     97 .6 degrees F oral Pulse rate:   72 / minute BP sitting:   100 / 62  (left arm) Cuff size:   large  Vitals Entered By: Zella Ball Ewing CMA Duncan Dull) (August 14, 2010 3:35 PM)  O2 Flow:  Room air CC: congestion, pain in right arm to heart on 08/12/10/RE   Primary Care Provider:  Corwin Levins MD  CC:  congestion and pain in right arm to heart on 08/12/10/RE.  History of Present Illness: here to f/u ; did have several seconds only pain to the right arm 3 days ago without recurrence, mild - seemed to start below the elbow and radiate to the left chest but not assoc with worsening sob, doe, wheezing, orthopnea, pnd, worsening LE edema, palps, dizziness or syncope , diaphoresis, n/v.  also with nasal allergy symptoms with moderate congestion, mild post nasal gtt  but no ST, cough  or sinus pain.   Pt denies new neuro symptoms such as headache, facial or extremity weakness    has ongoing stress related to husband , whom she had to take out of one NH, had at home for several days but dropped him twice, realized she couldnt take care of him, and was able to have him placed in a different NH;  denies worsening depressive symptoms, suicidal ideation or panic  Problems Prior to Update: 1)  Wheezing  (ICD-786.07) 2)  Headache  (ICD-784.0) 3)  Dyspnea On Exertion  (ICD-786.09) 4)  Frequency, Urinary  (ICD-788.41) 5)  Uri  (ICD-465.9) 6)  Chest Discomfort, Atypical  (ICD-786.59) 7)  Urinary Incontinence  (ICD-788.30) 8)  Sinusitis- Acute-nos  (ICD-461.9) 9)  Allergic Rhinitis  (ICD-477.9) 10)  Shoulder Pain, Left  (ICD-719.41) 11)  Inguinal Pain, Right  (ICD-789.09) 12)  Abdominal Pain, Lower  (ICD-789.09) 13)  Otitis Media, Acute, Left  (ICD-382.9) 14)  Foot  Pain, Right  (ICD-729.5) 15)  Preventive Health Care  (ICD-V70.0) 16)  Back Pain  (ICD-724.5) 17)  Constipation  (ICD-564.00) 18)  Otitis Media, Acute, Left  (ICD-382.9) 19)  Hyperlipidemia  (ICD-272.4) 20)  Bunions, Bilateral  (ICD-727.1) 21)  Family History Diabetes 1st Degree Relative  (ICD-V18.0) 22)  Family History of Cad Female 1st Degree Relative <50  (ICD-V17.3) 23)  Preventive Health Care  (ICD-V70.0) 24)  Family History of Colon Ca 1st Degree Relative <60  (ICD-V16.0) 25)  Symptom, Palpitations  (ICD-785.1) 26)  Osteoarthrosis Nos, Lower Leg  (ICD-715.96) 27)  Colonic Polyps, Hx of  (ICD-V12.72) 28)  Depression  (ICD-311)  Medications Prior to Update: 1)  Clonazepam 1 Mg  Tabs (Clonazepam) .... 1/2  - 1 By Mouth Three Times A Day Prn 2)  Naprosyn 500 Mg  Tabs (Naproxen) .[name].. 1 By Mouth Two Times A Day Prn 3)  Crestor 40 Mg  Tabs (Rosuvastatin Calcium) .... 1/2 By Mouth Once Daily 4)  Adult Aspirin Low Strength 81 Mg  Tbdp (Aspirin) .[name].. 1po Qd 5)  Hydrocodone-Acetaminophen 7.5-325 Mg Tabs (Hydrocodone-Acetaminophen) .[name].. 1 By Mouth Q 6 Hrs As Needed Pain 6)  Atenolol 25 Mg Tabs (Atenolol) .[name].. 1 By Mouth Once Daily 7)  Pristiq 100 Mg  Xr24h-Tab (Desvenlafaxine Succinate) .... Take 1 By Mouth Qd 8)  Trazodone Hcl 100 Mg Tabs (Trazodone Hcl) .... 3 By Mouth At Bedtime 9)  Meclizine Hcl 12.5 Mg Tabs (Meclizine Hcl) .[name].. 1 - 2 By Mouth Q 6 Hrs As Needed Dizziness 10)  Neurontin 300 Mg Caps (Gabapentin) .[name].. 1 By Mouth Three Times A Day 11)  Buspirone Hcl 5 Mg Tabs (Buspirone Hcl) .[name].. 1 By Mouth Three Times A Day 12)  Liothyronine Sodium 25 Mcg Tabs (Liothyronine Sodium) .[name].. 1 By Mouth Two Times A Day 13)  Azithromycin 250 Mg Tabs (Azithromycin) .... 2po Qd For 1 Day, Then 1po Qd For 4days, Then Stop 14)  Tessalon Perles 100 Mg Caps (Benzonatate) .[name].. 1-2 By Mouth Three Times A Day As Needed Cough 15)  Prednisone 10 Mg Tabs (Prednisone) .... 4po Qd For 3days, Then 3po Qd For 3days,  Then 2po Qd For 3days, Then 1po Qd For 3 Days, Then Stop 16)  Proair Hfa 108 (90 Base) Mcg/act Aers (Albuterol Sulfate) .... 2 Puffs Four Times Per Day As Needed  Current Medications (verified): 1)  Clonazepam 1 Mg  Tabs (Clonazepam) .... 1/2  - 1 By Mouth Three Times A Day Prn 2)  Naprosyn 500 Mg  Tabs (Naproxen) .[name].. 1 By Mouth Two Times A Day Prn 3)  Crestor 40 Mg  Tabs (Rosuvastatin Calcium) .... 1/2 By Mouth Once Daily 4)  Adult Aspirin Low Strength 81 Mg  Tbdp (Aspirin) .[name].. 1po Qd 5)  Hydrocodone-Acetaminophen 7.5-325 Mg Tabs (Hydrocodone-Acetaminophen) .[name].. 1 By Mouth Q 6 Hrs As Needed Pain 6)  Atenolol 25 Mg Tabs (Atenolol) .[name].. 1 By Mouth Once Daily 7)  Pristiq 100 Mg Xr24h-Tab (Desvenlafaxine Succinate) .... Take 1 By Mouth Qd 8)  Trazodone Hcl 100 Mg Tabs (Trazodone Hcl) .... 3 By Mouth At Bedtime 9)  Meclizine Hcl 12.5 Mg Tabs (Meclizine Hcl) .[name].. 1 - 2 By Mouth Q 6 Hrs As Needed Dizziness 10)  Neurontin 300 Mg Caps (Gabapentin) .[name].. 1 By Mouth Three Times A Day 11)  Buspirone Hcl 5 Mg Tabs (Buspirone Hcl) .[name].. 1 By Mouth Three Times A Day 12)  Liothyronine Sodium 25 Mcg Tabs (Liothyronine Sodium) .[name].. 1 By Mouth Two Times A Day 13)  Proair Hfa 108 (90 Base) Mcg/act Aers (Albuterol Sulfate) .... 2 Puffs Four Times Per Day As Needed 14)  Levocetirizine Dihydrochloride 5 Mg Tabs (Levocetirizine Dihydrochloride) .[name].. 1po Once Daily  As Needed Allergies 15)  Fluticasone Propionate 50 Mcg/act Susp (Fluticasone Propionate) .... 2 Spray Once Daily As Needed  Allergies (verified): 1)  ! * Abilify 2)  ! Zocor  Past History:  Past Medical History: Last updated: 11/13/2009 Depression Colonic polyps, hx of DJD- left knee Hyperlipidemia x 1 year Chronic LBP  - dr Otelia Sergeant Allergic rhinitis SVT (documented at ER visits requiring treatment with adenosine. Last event 2008.)  Past Surgical History: Last updated: 09/13/2007 Cholecystectomy Hysterectomy Lumbar laminectomy  Family  History: Last updated: 11/13/2009 Family History of Colon CA 1st degree relative  - mother died at 29 yo Family History of CAD Female 1st degree relative - father at 63 yo (Died of MI.  CAD 4 -5 years before this. Brother with bypass early 10s. Family History Diabetes 1st degree relative - father  Social History: Last updated: 11/13/2009 Former Smoker (Smoked as a teenager.  Quit 1970. Alcohol use-no Disabled - psych Married 2 kids  Risk Factors: Smoking Status: quit (09/13/2007)  Review of Systems       all  otherwise negative per pt -    Physical Exam  General:  alert and overweight-appearing.   Head:  normocephalic and atraumatic.   Eyes:  vision grossly intact, pupils equal, and pupils round.   Ears:  R ear normal and L ear normal.   Nose:  no external deformity, nasal dischargemucosal pallor, and mucosal edema.   Mouth:  no gingival abnormalities and pharynx pink and moist.   Neck:  supple and no masses.   Lungs:  normal respiratory effort and normal breath sounds.   Heart:  normal rate and regular rhythm.   Abdomen:  soft, non-tender, and normal bowel sounds.   Msk:  no joint tenderness and no joint swelling.   Extremities:  no edema, no erythema    Impression & Recommendations:  Problem # 1:  ALLERGIC RHINITIS (ICD-477.9)  foir depo shot today, also treat as above, f/u any worsening signs or symptoms   Her updated medication list for this problem includes:    Levocetirizine Dihydrochloride 5 Mg Tabs (Levocetirizine dihydrochloride) .[name]... 1po once daily  as needed allergies    Fluticasone Propionate 50 Mcg/act Susp (Fluticasone propionate) .[name]... 2 spray once daily as needed  Orders: Depo- Medrol 40mg  (J1030) Depo- Medrol 80mg  (J1040) Admin of Therapeutic Inj  intramuscular or subcutaneous (62130)  Problem # 2:  CHEST DISCOMFORT, ATYPICAL (ICD-786.59)  atypical, ok to follow for now due to the fleeting nature, declines stress test  Orders: EKG w/  Interpretation (93000)  Problem # 3:  DEPRESSION (ICD-311)  Her updated medication list for this problem includes:    Clonazepam 1 Mg Tabs (Clonazepam) .[name]... 1/2  - 1 by mouth three times a day prn    Pristiq 100 Mg Xr24h-tab (Desvenlafaxine succinate) .[name]... Take 1 by mouth qd    Trazodone Hcl 100 Mg Tabs (Trazodone hcl) .Marland KitchenMarland KitchenMarland KitchenMarland Kitchen 3 by mouth at bedtime    Buspirone Hcl 5 Mg Tabs (Buspirone hcl) .[name]... 1 by mouth three times a day mores stress recently with movement of husband from one NH to another, but overall stable overall by hx and exam, ok to continue meds/tx as is   Complete Medication List: 1)  Clonazepam 1 Mg Tabs (Clonazepam) .... 1/2  - 1 by mouth three times a day prn 2)  Naprosyn 500 Mg Tabs (Naproxen) .[name].. 1 by mouth two times a day prn 3)  Crestor 40 Mg Tabs (Rosuvastatin calcium) .... 1/2 by mouth once daily 4)  Adult Aspirin Low Strength 81 Mg Tbdp (Aspirin) .[name].. 1po qd 5)  Hydrocodone-acetaminophen 7.5-325 Mg Tabs (Hydrocodone-acetaminophen) .[name].. 1 by mouth q 6 hrs as needed pain 6)  Atenolol 25 Mg Tabs (Atenolol) .[name].. 1 by mouth once daily 7)  Pristiq 100 Mg Xr24h-tab (Desvenlafaxine succinate) .... Take 1 by mouth qd 8)  Trazodone Hcl 100 Mg Tabs (Trazodone hcl) .... 3 by mouth at bedtime 9)  Meclizine Hcl 12.5 Mg Tabs (Meclizine hcl) .[name].. 1 - 2 by mouth q 6 hrs as needed dizziness 10)  Neurontin 300 Mg Caps (Gabapentin) .[name].. 1 by mouth three times a day 11)  Buspirone Hcl 5 Mg Tabs (Buspirone hcl) .[name].. 1 by mouth three times a day 12)  Liothyronine Sodium 25 Mcg Tabs (Liothyronine sodium) .[name].. 1 by mouth two times a day 13)  Proair Hfa 108 (90 Base) Mcg/act Aers (Albuterol sulfate) .... 2 puffs four times per day as needed 14)  Levocetirizine Dihydrochloride 5 Mg Tabs (Levocetirizine dihydrochloride) .[name].. 1po once daily  as needed allergies 15)  Fluticasone Propionate  50 Mcg/act Susp (Fluticasone propionate) .... 2 spray once daily as needed  Other Orders: Flu Vaccine 43yrs  + MEDICARE PATIENTS (W1093) Administration Flu vaccine - MCR (A3557)  Patient Instructions: 1)  Please take all new medications as prescribed 2)  Continue all previous medications as before this visit  3)  You had the steroid shot today 4)  Your EKG looked good today 5)  Please schedule a follow-up appointment in 6 months with CPX labs Prescriptions: FLUTICASONE PROPIONATE 50 MCG/ACT SUSP (FLUTICASONE PROPIONATE) 2 spray once daily as needed  #1 x 11   Entered and Authorized by:   Corwin Levins MD   Signed by:   Corwin Levins MD on 08/14/2010   Method used:   Print then Give to Patient   RxID:   [id number] LEVOCETIRIZINE DIHYDROCHLORIDE 5 MG TABS (LEVOCETIRIZINE DIHYDROCHLORIDE) 1po once daily  as needed allergies  #30 x 11   Entered and Authorized by:   Corwin Levins MD   Signed by:   Corwin Levins MD on 08/14/2010   Method used:   Print then Give to Patient   RxID:   [id number]    Flu Vaccine Consent Questions     Do you have a history of severe allergic reactions to this vaccine? no    Any prior history of allergic reactions to egg and/or gelatin? no    Do you have a sensitivity to the preservative Thimersol? no    Do you have a past history of Guillan-Barre Syndrome? no    Do you currently have an acute febrile illness? no    Have you ever had a severe reaction to latex? no    Vaccine information given and explained to patient? yes    Are you currently pregnant? no    Lot Number:[id number]   Exp Date:06/08/2011   Site Given  Left Deltoid IMflu   Medication Administration  Injection # 1:    Medication: Depo- Medrol 40mg     Diagnosis: ALLERGIC RHINITIS (ICD-477.9)    Route: IM    Site: LUOQ gluteus    Exp Date: 03/2013    Lot #: [id number]    Mfr: [employer]    Comments: Patient received 120mg  Depo-medrol    Patient tolerated injection without complications    Given by: Zella Ball Ewing CMA Duncan Dull) (August 14, 2010 4:33 PM)  Injection # 2:    Medication: Depo-  Medrol 80mg     Diagnosis: ALLERGIC RHINITIS (ICD-477.9)    Route: IM    Site: LUOQ gluteus    Exp Date: 03/2013    Lot #: [id number]    Mfr: [employer]    Given by: Zella Ball Ewing CMA Duncan Dull) (August 14, 2010 4:33 PM)  Orders Added: 1)  Flu Vaccine 39yrs + MEDICARE PATIENTS [Q2039] 2)  Administration Flu vaccine - MCR [G0008] 3)  EKG w/ Interpretation [93000] 4)  Depo- Medrol 40mg  [J1030] 5)  Depo- Medrol 80mg  [J1040] 6)  Admin of Therapeutic Inj  intramuscular or subcutaneous [96372] 7)  Est. Patient Level IV [26948]

## 2011-01-08 NOTE — Letter (Signed)
Summary: [facility]   Imported By: Lennie Odor 10/23/2010 13:27:36  _____________________________________________________________________  External Attachment:    Type:   Image     Comment:   External Document

## 2011-01-08 NOTE — Assessment & Plan Note (Signed)
Summary: DIZZY X 2 DYS -REFUSED ANOTHER MD FOR TODAY-ER IF WORSEN--STC   Vital Signs:  Patient profile:   65 year old female Height:      66 inches Weight:      205.75 pounds BMI:     33.33 O2 Sat:      97 % on Room air Temp:     97 degrees F oral Pulse rate:   69 / minute BP sitting:   106 / 64  (left arm) Cuff size:   large  Vitals Entered ByZella Ball Ewing (February 06, 2010 3:50 PM)  O2 Flow:  Room air CC: dizzy, SOB, left arm pain, headaches/RE   Primary Care Provider:  Corwin Levins MD  CC:  dizzy, SOB, left arm pain, and headaches/RE.  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    Problems Prior to Update: 1)  Dyspnea On Exertion  (ICD-786.09) 2)  Frequency, Urinary  (ICD-788.41) 3)  Uri  (ICD-465.9) 4)  Chest Discomfort, Atypical  (ICD-786.59) 5)  Urinary Incontinence  (ICD-788.30) 6)  Sinusitis- Acute-nos  (ICD-461.9) 7)  Allergic Rhinitis  (ICD-477.9) 8)  Shoulder Pain, Left  (ICD-719.41) 9)  Inguinal Pain, Right  (ICD-789.09) 10)  Abdominal Pain, Lower  (ICD-789.09) 11)   Otitis Media, Acute, Left  (ICD-382.9) 12)  Foot Pain, Right  (ICD-729.5) 13)  Preventive Health Care  (ICD-V70.0) 14)  Back Pain  (ICD-724.5) 15)  Constipation  (ICD-564.00) 16)  Otitis Media, Acute, Left  (ICD-382.9) 17)  Hyperlipidemia  (ICD-272.4) 18)  Bunions, Bilateral  (ICD-727.1) 19)  Family History Diabetes 1st Degree Relative  (ICD-V18.0) 20)  Family History of Cad Female 1st Degree Relative <50  (ICD-V17.3) 21)  Preventive Health Care  (ICD-V70.0) 22)  Family History of Colon Ca 1st Degree Relative <60  (ICD-V16.0) 23)  Symptom, Palpitations  (ICD-785.1) 24)  Osteoarthrosis Nos, Lower Leg  (ICD-715.96) 25)  Colonic Polyps, Hx of  (ICD-V12.72) 26)  Depression  (ICD-311)  Medications Prior to Update: 1)  Clonazepam 1 Mg  Tabs (Clonazepam) .... 1/2  - 1 By Mouth Three Times A Day Prn 2)  Naprosyn 500 Mg  Tabs (Naproxen) .[name].. 1 By Mouth Two Times A Day Prn 3)  Crestor 40 Mg  Tabs (Rosuvastatin Calcium) .... 1/2 By Mouth Once Daily 4)  Adult Aspirin Low Strength 81 Mg  Tbdp (Aspirin) .[name].. 1po Qd 5)  Hydrocodone-Acetaminophen 7.5-325 Mg Tabs (Hydrocodone-Acetaminophen) .[name].. 1 By Mouth Q 6 Hrs As Needed Pain 6)  Atenolol 25 Mg Tabs (  Atenolol) .[name].. 1 By Mouth Once Daily 7)  Pristiq 100 Mg Xr24h-Tab (Desvenlafaxine Succinate) .... Take 1 By Mouth Qd 8)  Trazodone Hcl 100 Mg Tabs (Trazodone Hcl) .[name].. 1 By Mouth At Bedtime  Current Medications (verified): 1)  Clonazepam 1 Mg  Tabs (Clonazepam) .... 1/2  - 1 By Mouth Three Times A Day Prn 2)  Naprosyn 500 Mg  Tabs (Naproxen) .[name].. 1 By Mouth Two Times A Day Prn 3)  Crestor 40 Mg  Tabs (Rosuvastatin Calcium) .... 1/2 By Mouth Once Daily 4)  Adult Aspirin Low Strength 81 Mg  Tbdp (Aspirin) .[name].. 1po Qd 5)  Hydrocodone-Acetaminophen 7.5-325 Mg Tabs (Hydrocodone-Acetaminophen) .[name].. 1 By Mouth Q 6 Hrs As Needed Pain 6)  Atenolol 25 Mg Tabs (Atenolol) .[name].. 1 By Mouth Once Daily 7)  Pristiq 100 Mg Xr24h-Tab (Desvenlafaxine Succinate) .... Take  1 By Mouth Qd 8)  Trazodone Hcl 100 Mg Tabs (Trazodone Hcl) .[name].. 1 By Mouth At Bedtime 9)  Meclizine Hcl 12.5 Mg Tabs (Meclizine Hcl) .[name].. 1 - 2 By Mouth Q 6 Hrs As Needed Dizziness 10)  Ciprofloxacin Hcl 500 Mg Tabs (Ciprofloxacin Hcl) .[name].. 1po Two Times A Day  Allergies (verified): 1)  ! * Abilify 2)  ! Zocor  Past History:  Past Medical History: Last updated: 11/13/2009 Depression Colonic polyps, hx of DJD- left knee Hyperlipidemia x 1 year Chronic LBP  - dr Otelia Sergeant Allergic rhinitis SVT (documented at ER visits requiring treatment with adenosine. Last event 2008.)  Past Surgical History: Last updated: 09/13/2007 Cholecystectomy Hysterectomy Lumbar laminectomy  Social History: Last updated: 11/13/2009 Former Smoker (Smoked as a teenager.  Quit 1970. Alcohol use-no Disabled - psych Married 2 kids  Risk Factors: Smoking Status: quit (09/13/2007)  Review of Systems       all otherwise negative per pt -  Physical Exam  General:  alert and overweight-appearing. , mild ill  Head:  normocephalic and atraumatic.   Eyes:  vision grossly intact, pupils equal, and pupils round.   Ears:  left tm mild erythema, right tm clear, canals benign Nose:  nasal dischargemucosal pallor and mucosal edema.   Mouth:  pharyngeal erythema and fair dentition.   Neck:  supple and cervical lymphadenopathy.   Lungs:  normal respiratory effort and normal breath sounds.   Heart:  normal rate and regular rhythm.   Abdomen:  soft and normal bowel sounds.  , with mild mid low abd tender without rebound or guarding Msk:  no joint tenderness and no joint swelling.  , no spine tender ; has some left upper thoracic paravertebral tender, left shoulder with FROM, NT Extremities:  no edema, no erythema  Neurologic:  alert & oriented X3, cranial nerves II-XII intact, strength normal in all extremities, and DTRs symmetrical and normal.     Impression & Recommendations:  Problem # 1:  URI  (ICD-465.9)  Her updated medication list for this problem includes:    Naprosyn 500 Mg Tabs (Naproxen) .[name]... 1 by mouth two times a day prn    Adult Aspirin Low Strength 81 Mg Tbdp (Aspirin) .[name]... 1po qd with left ear eustachain symotms, and vertigo - for meclizine as needed   Problem # 2:  FREQUENCY, URINARY (ICD-788.41)  for empiric cipro, and urine studies today, consider vesicare if urine studies neg  Orders: T-Culture, Urine (95188-41660) TLB-Udip w/ Micro (81001-URINE)  Problem # 3:  DYSPNEA ON EXERTION (ICD-786.09)  Her updated medication list for this problem includes:    Atenolol 25 Mg Tabs (  Atenolol) .[name]... 1 by mouth once daily exam benign, to check ecg, cxr, labs today, and echo   Orders: EKG w/ Interpretation (93000) Echo Referral (Echo) T-2 View CXR, Same Day (71020.5TC) TLB-BMP (Basic Metabolic Panel-BMET) (80048-METABOL) TLB-CBC Platelet - w/Differential (85025-CBCD) TLB-Hepatic/Liver Function Pnl (80076-HEPATIC) TLB-TSH (Thyroid Stimulating Hormone) (84443-TSH) TLB-Sedimentation Rate (ESR) (85652-ESR)  Problem # 4:  HEADACHE (ICD-784.0)  Her updated medication list for this problem includes:    Naprosyn 500 Mg Tabs (Naproxen) .[name]... 1 by mouth two times a day prn    Adult Aspirin Low Strength 81 Mg Tbdp (Aspirin) .[name]... 1po qd    Hydrocodone-acetaminophen 7.5-325 Mg Tabs (Hydrocodone-acetaminophen) .[name]... 1 by mouth q 6 hrs as needed pain    Atenolol 25 Mg Tabs (Atenolol) .[name]... 1 by mouth once daily exam benign, possible recent concussion without LOC, exam benign today, ok to follow for now  Complete Medication List: 1)  Clonazepam 1 Mg Tabs (Clonazepam) .... 1/2  - 1 by mouth three times a day prn 2)  Naprosyn 500 Mg Tabs (Naproxen) .[name].. 1 by mouth two times a day prn 3)  Crestor 40 Mg Tabs (Rosuvastatin calcium) .... 1/2 by mouth once daily 4)  Adult Aspirin Low Strength 81 Mg Tbdp (Aspirin) .[name].. 1po qd 5)  Hydrocodone-acetaminophen 7.5-325 Mg Tabs  (Hydrocodone-acetaminophen) .[name].. 1 by mouth q 6 hrs as needed pain 6)  Atenolol 25 Mg Tabs (Atenolol) .[name].. 1 by mouth once daily 7)  Pristiq 100 Mg Xr24h-tab (Desvenlafaxine succinate) .... Take 1 by mouth qd 8)  Trazodone Hcl 100 Mg Tabs (Trazodone hcl) .[name].. 1 by mouth at bedtime 9)  Meclizine Hcl 12.5 Mg Tabs (Meclizine hcl) .[name].. 1 - 2 by mouth q 6 hrs as needed dizziness 10)  Ciprofloxacin Hcl 500 Mg Tabs (Ciprofloxacin hcl) .[name].. 1po two times a day  Other Orders: TLB-Lipid Panel (80061-LIPID)  Patient Instructions: 1)  Please take all new medications as prescribed - the antibiotic and meclizine - both sent to the pharmacy 2)  Continue all previous medications as before this visit  3)  Your EKG was good today 4)  Please go to Radiology in the basement level for your X-Ray today  5)  Please go to the Lab in the basement for your blood and/or urine tests today  6)  You will be contacted about the referral(s) to: Echocardiogram 7)  Please schedule a follow-up appointment as needed. Prescriptions: CIPROFLOXACIN HCL 500 MG TABS (CIPROFLOXACIN HCL) 1po two times a day  #20 x 0   Entered and Authorized by:   Corwin Levins MD   Signed by:   Corwin Levins MD on 02/06/2010   Method used:   Electronically to        [facility]  [phone]* (retail)       [address]       [place], [place]  [address]       Ph: [phone] or [phone]       Fax: [phone]   RxID:   [id number] MECLIZINE HCL 12.5 MG TABS (MECLIZINE HCL) 1 - 2 by mouth q 6 hrs as needed dizziness  #40 x 1   Entered and Authorized by:   Corwin Levins MD   Signed by:   Corwin Levins MD on 02/06/2010   Method used:   Electronically to        [facility]  [phone]* (retail)  [address]       [place], [place]  [address]       Ph: [phone] or [phone]       Fax: [phone]   RxID:   [id number]

## 2011-01-10 NOTE — Letter (Signed)
Summary: [facility]  [facility]   Imported By: Sherian Rein 11/26/2010 12:05:50  _____________________________________________________________________  External Attachment:    Type:   Image     Comment:   External Document

## 2011-01-10 NOTE — Progress Notes (Signed)
Summary: Rx refill req  Phone Note Refill Request Message from:  Patient on January 04, 2011 11:37 AM  Refills Requested: Medication #1:  ATENOLOL 25 MG TABS 1 by mouth once daily   Dosage confirmed as above?Dosage Confirmed   Supply Requested: 6 months  Method Requested: Electronic Initial call taken by: Margaret Pyle, CMA,  January 04, 2011 11:37 AM    Prescriptions: ATENOLOL 25 MG TABS (ATENOLOL) 1 by mouth once daily  #90 Each x 1   Entered by:   Margaret Pyle, CMA   Authorized by:   Corwin Levins MD   Signed by:   Margaret Pyle, CMA on 01/04/2011   Method used:   Electronically to        [facility] [phone]* (retail)       [address]       [place], [place]  [address]       Ph: [phone]       Fax: [phone]   RxID:   [id number]

## 2011-01-30 ENCOUNTER — Encounter: Payer: Self-pay | Admitting: Internal Medicine

## 2011-02-08 ENCOUNTER — Encounter: Payer: Self-pay | Admitting: Internal Medicine

## 2011-02-08 ENCOUNTER — Ambulatory Visit (INDEPENDENT_AMBULATORY_CARE_PROVIDER_SITE_OTHER): Payer: Medicare Other | Admitting: Women's Health

## 2011-02-08 DIAGNOSIS — Z833 Family history of diabetes mellitus: Secondary | ICD-10-CM

## 2011-02-08 DIAGNOSIS — R350 Frequency of micturition: Secondary | ICD-10-CM

## 2011-02-08 DIAGNOSIS — B373 Candidiasis of vulva and vagina: Secondary | ICD-10-CM

## 2011-02-08 DIAGNOSIS — N898 Other specified noninflammatory disorders of vagina: Secondary | ICD-10-CM

## 2011-02-12 ENCOUNTER — Ambulatory Visit: Payer: Medicare Other | Admitting: Internal Medicine

## 2011-02-13 ENCOUNTER — Encounter: Payer: Self-pay | Admitting: Internal Medicine

## 2011-02-13 ENCOUNTER — Telehealth: Payer: Self-pay | Admitting: Internal Medicine

## 2011-02-14 ENCOUNTER — Other Ambulatory Visit: Payer: Self-pay | Admitting: Internal Medicine

## 2011-02-14 ENCOUNTER — Encounter (INDEPENDENT_AMBULATORY_CARE_PROVIDER_SITE_OTHER): Payer: Self-pay | Admitting: *Deleted

## 2011-02-14 ENCOUNTER — Other Ambulatory Visit: Payer: Medicare Other

## 2011-02-14 DIAGNOSIS — Z79899 Other long term (current) drug therapy: Secondary | ICD-10-CM

## 2011-02-14 DIAGNOSIS — R7301 Impaired fasting glucose: Secondary | ICD-10-CM

## 2011-02-14 LAB — BASIC METABOLIC PANEL
BUN: 16 mg/dL (ref 6–23)
CO2: 25 mEq/L (ref 19–32)
Chloride: 108 mEq/L (ref 96–112)
Creatinine, Ser: 0.7 mg/dL (ref 0.4–1.2)
Glucose, Bld: 123 mg/dL — ABNORMAL HIGH (ref 70–99)
Potassium: 4.4 mEq/L (ref 3.5–5.1)

## 2011-02-14 LAB — CBC WITH DIFFERENTIAL/PLATELET
Basophils Absolute: 0 10*3/uL (ref 0.0–0.1)
Eosinophils Relative: 0.7 % (ref 0.0–5.0)
HCT: 42.6 % (ref 36.0–46.0)
Lymphs Abs: 2.3 10*3/uL (ref 0.7–4.0)
MCV: 89 fl (ref 78.0–100.0)
Monocytes Absolute: 0.6 10*3/uL (ref 0.1–1.0)
Monocytes Relative: 5.1 % (ref 3.0–12.0)
Neutrophils Relative %: 73.4 % (ref 43.0–77.0)
Platelets: 319 10*3/uL (ref 150.0–400.0)
RDW: 12.4 % (ref 11.5–14.6)
WBC: 11.2 10*3/uL — ABNORMAL HIGH (ref 4.5–10.5)

## 2011-02-14 LAB — HEPATIC FUNCTION PANEL
ALT: 19 U/L (ref 0–35)
AST: 14 U/L (ref 0–37)
Albumin: 4.1 g/dL (ref 3.5–5.2)
Total Protein: 6.7 g/dL (ref 6.0–8.3)

## 2011-02-14 LAB — LIPID PANEL
Cholesterol: 179 mg/dL (ref 0–200)
LDL Cholesterol: 112 mg/dL — ABNORMAL HIGH (ref 0–99)
Triglycerides: 120 mg/dL (ref 0.0–149.0)

## 2011-02-14 LAB — URINALYSIS
Bilirubin Urine: NEGATIVE
Hgb urine dipstick: NEGATIVE
Ketones, ur: NEGATIVE
Leukocytes, UA: NEGATIVE
Nitrite: NEGATIVE
Urobilinogen, UA: 0.2 (ref 0.0–1.0)
pH: 5.5 (ref 5.0–8.0)

## 2011-02-14 LAB — TSH: TSH: 0.04 u[IU]/mL — ABNORMAL LOW (ref 0.35–5.50)

## 2011-02-14 NOTE — Letter (Signed)
Summary: Shayne Alken MD  Shayne Alken MD   Imported By: Lester [place] 02/06/2011 09:51:32  _____________________________________________________________________  External Attachment:    Type:   Image     Comment:   External Document

## 2011-02-15 ENCOUNTER — Encounter: Payer: Self-pay | Admitting: Internal Medicine

## 2011-02-15 ENCOUNTER — Ambulatory Visit: Payer: Medicare Other | Admitting: Internal Medicine

## 2011-02-15 ENCOUNTER — Ambulatory Visit (INDEPENDENT_AMBULATORY_CARE_PROVIDER_SITE_OTHER): Payer: Medicare Other | Admitting: Internal Medicine

## 2011-02-15 DIAGNOSIS — E0590 Thyrotoxicosis, unspecified without thyrotoxic crisis or storm: Secondary | ICD-10-CM | POA: Insufficient documentation

## 2011-02-15 DIAGNOSIS — Z0000 Encounter for general adult medical examination without abnormal findings: Secondary | ICD-10-CM

## 2011-02-15 DIAGNOSIS — R1310 Dysphagia, unspecified: Secondary | ICD-10-CM | POA: Insufficient documentation

## 2011-02-15 DIAGNOSIS — E039 Hypothyroidism, unspecified: Secondary | ICD-10-CM | POA: Insufficient documentation

## 2011-02-15 DIAGNOSIS — R1319 Other dysphagia: Secondary | ICD-10-CM | POA: Insufficient documentation

## 2011-02-15 DIAGNOSIS — J209 Acute bronchitis, unspecified: Secondary | ICD-10-CM

## 2011-02-15 DIAGNOSIS — E785 Hyperlipidemia, unspecified: Secondary | ICD-10-CM

## 2011-02-18 ENCOUNTER — Encounter (INDEPENDENT_AMBULATORY_CARE_PROVIDER_SITE_OTHER): Payer: Self-pay | Admitting: *Deleted

## 2011-02-19 NOTE — Progress Notes (Signed)
  Phone Note Outgoing Call   Call placed by: Robin Call placed to: Patient Summary of Call: Called patient to inform to schedule labs prior to OV with Dr. Jonny Ruiz 02/15/2011. Patient agreed and scheduled for CPX, hgba1c 790.2  on 02/14/2011. Initial call taken by: Robin Ewing CMA Duncan Dull),  February 13, 2011 2:39 PM

## 2011-02-19 NOTE — Op Note (Signed)
Summary: Cervical Epidural Steroid Injection / [facility]  [facility]   Imported By: Lennie Odor 02/15/2011 11:28:29  _____________________________________________________________________  External Attachment:    Type:   Image     Comment:   External Document

## 2011-02-19 NOTE — Assessment & Plan Note (Signed)
Summary: FOLLOW UP ON LABS/NWS   Vital Signs:  Patient profile:   65 year old female Height:      66.5 inches Weight:      200.25 pounds BMI:     31.95 O2 Sat:      97 % on Room air Temp:     98.1 degrees F oral Pulse rate:   68 / minute BP sitting:   110 / 70  (left arm) Cuff size:   large  Vitals Entered By: Zella Ball Ewing CMA Duncan Dull) (February 15, 2011 3:23 PM)  O2 Flow:  Room air  CC: Followup on labs/Re   Primary Care Provider:  Corwin Levins MD  CC:  Followup on labs/Re.  History of Present Illness: here for welness and to f/u - overall doing ok; Pt denies CP, worsening sob, doe, wheezing, orthopnea, pnd, worsening LE edema, palps, dizziness or syncope  Pt denies new neuro symptoms such as headache, facial or extremity weakness  Pt denies polydipsia, polyuria  Overall good compliance with meds, trying to follow low chol  diet, wt stable, little excercise however.  No fever, wt loss, night sweats, loss of appetite or other constitutional symptoms  Overall good compliance with meds, and good tolerability.  Denies worsening depressive symptoms, suicidal ideation, or panic, though has ongoing anxiety.  Pt states good ability with ADL's, low fall risk, home safety reviewed and adequate, no significant change in hearing or vision, trying to follow lower chol diet, and occasionally active only with regular excercise.      Not taking the crestor recently - trying to do better with diet alone.  Has major issue with pain to the neck and due for c-spine surgury soon.  Does not take the morphine often as precrbied due to loopiness.   Also with several months dysphagia to solids at the neck level, often with choking aspect but no aspiration.  Also per pt she is on thyroid med per psychiatry to help "boost my depression" without wt loss, but has mild agitation.  Incidetnly today with 2-3 days ST with prod cough greenish sputum.    Preventive Screening-Counseling & Management      Drug Use:  no.     Problems Prior to Update: 1)  Bronchitis-acute  (ICD-466.0) 2)  Other Dysphagia  (ICD-787.29) 3)  Hyperthyroidism  (ICD-242.90) 4)  Wheezing  (ICD-786.07) 5)  Headache  (ICD-784.0) 6)  Dyspnea On Exertion  (ICD-786.09) 7)  Frequency, Urinary  (ICD-788.41) 8)  Uri  (ICD-465.9) 9)  Chest Discomfort, Atypical  (ICD-786.59) 10)  Urinary Incontinence  (ICD-788.30) 11)  Sinusitis- Acute-nos  (ICD-461.9) 12)  Allergic Rhinitis  (ICD-477.9) 13)  Shoulder Pain, Left  (ICD-719.41) 14)  Inguinal Pain, Right  (ICD-789.09) 15)  Abdominal Pain, Lower  (ICD-789.09) 16)  Otitis Media, Acute, Left  (ICD-382.9) 17)  Foot Pain, Right  (ICD-729.5) 18)  Preventive Health Care  (ICD-V70.0) 19)  Back Pain  (ICD-724.5) 20)  Constipation  (ICD-564.00) 21)  Otitis Media, Acute, Left  (ICD-382.9) 22)  Hyperlipidemia  (ICD-272.4) 23)  Bunions, Bilateral  (ICD-727.1) 24)  Family History Diabetes 1st Degree Relative  (ICD-V18.0) 25)  Family History of Cad Female 1st Degree Relative <50  (ICD-V17.3) 26)  Preventive Health Care  (ICD-V70.0) 27)  Family History of Colon Ca 1st Degree Relative <60  (ICD-V16.0) 28)  Symptom, Palpitations  (ICD-785.1) 29)  Osteoarthrosis Nos, Lower Leg  (ICD-715.96) 30)  Colonic Polyps, Hx of  (ICD-V12.72) 31)  Depression  (ICD-311)  Medications  Prior to Update: 1)  Clonazepam 1 Mg  Tabs (Clonazepam) .... 1/2  - 1 By Mouth Three Times A Day Prn 2)  Naprosyn 500 Mg  Tabs (Naproxen) .[name].. 1 By Mouth Two Times A Day Prn 3)  Crestor 40 Mg  Tabs (Rosuvastatin Calcium) .... 1/2 By Mouth Once Daily 4)  Adult Aspirin Low Strength 81 Mg  Tbdp (Aspirin) .[name].. 1po Qd 5)  Hydrocodone-Acetaminophen 7.5-325 Mg Tabs (Hydrocodone-Acetaminophen) .[name].. 1 By Mouth Q 6 Hrs As Needed Pain 6)  Atenolol 25 Mg Tabs (Atenolol) .[name].. 1 By Mouth Once Daily 7)  Pristiq 100 Mg Xr24h-Tab (Desvenlafaxine Succinate) .... Take 1 By Mouth Qd 8)  Trazodone Hcl 100 Mg Tabs (Trazodone Hcl) .... 3 By Mouth At  Bedtime 9)  Meclizine Hcl 12.5 Mg Tabs (Meclizine Hcl) .[name].. 1 - 2 By Mouth Q 6 Hrs As Needed Dizziness 10)  Neurontin 300 Mg Caps (Gabapentin) .[name].. 1 By Mouth Three Times A Day 11)  Buspirone Hcl 5 Mg Tabs (Buspirone Hcl) .[name].. 1 By Mouth Three Times A Day 12)  Liothyronine Sodium 25 Mcg Tabs (Liothyronine Sodium) .[name].. 1 By Mouth Two Times A Day 13)  Proair Hfa 108 (90 Base) Mcg/act Aers (Albuterol Sulfate) .... 2 Puffs Four Times Per Day As Needed 14)  Levocetirizine Dihydrochloride 5 Mg Tabs (Levocetirizine Dihydrochloride) .[name].. 1po Once Daily  As Needed Allergies 15)  Fluticasone Propionate 50 Mcg/act Susp (Fluticasone Propionate) .... 2 Spray Once Daily As Needed  Current Medications (verified): 1)  Clonazepam 1 Mg  Tabs (Clonazepam) .... 1/2  - 1 By Mouth Three Times A Day Prn 2)  Adult Aspirin Low Strength 81 Mg  Tbdp (Aspirin) .[name].. 1po Qd 3)  Atenolol 25 Mg Tabs (Atenolol) .[name].. 1 By Mouth Once Daily 4)  Pristiq 100 Mg Xr24h-Tab (Desvenlafaxine Succinate) .... Take 1 By Mouth Qd 5)  Trazodone Hcl 100 Mg Tabs (Trazodone Hcl) .... 3 By Mouth At Bedtime 6)  Proair Hfa 108 (90 Base) Mcg/act Aers (Albuterol Sulfate) .... 2 Puffs Four Times Per Day As Needed 7)  Fluticasone Propionate 50 Mcg/act Susp (Fluticasone Propionate) .... 2 Spray Once Daily As Needed 8)  Vitamin D 2000 Unit Tabs (Cholecalciferol) .[name].. 1 By Mouth Once Daily 9)  Morphine Sulfate 15 Mg Tabs (Morphine Sulfate) .[name].. 1 By Mouth Three Times A Day As Needed 10)  Lamictal 100 Mg Tabs (Lamotrigine) .[name].. 1 By Mouth Once Daily 11)  Azithromycin 250 Mg Tabs (Azithromycin) .... 2po Qd For 1 Day, Then 1po Qd For 4days, Then Stop  Allergies (verified): 1)  ! * Abilify 2)  ! Zocor  Past History:  Past Surgical History: Last updated: 09/13/2007 Cholecystectomy Hysterectomy Lumbar laminectomy  Family History: Last updated: 11/13/2009 Family History of Colon CA 1st degree relative  - mother died at 85 yo Family History of CAD  Female 1st degree relative - father at 52 yo (Died of MI.  CAD 4 -5 years before this. Brother with bypass early 67s. Family History Diabetes 1st degree relative - father  Social History: Last updated: 02/15/2011 Former Smoker (Smoked as a teenager.  Quit 1970. Alcohol use-no Disabled - psych Married - husband chronically ill in nursing home 2 kids Drug use-no  Risk Factors: Smoking Status: quit (09/13/2007)  Past Medical History: Depression Colonic polyps, hx of DJD- left knee Hyperlipidemia x 1 year Chronic LBP  - dr Otelia Sergeant Allergic rhinitis SVT (documented at ER visits requiring treatment with adenosine. Last event 2008.) Hyperlipidemia  Social History: Former Smoker (Smoked  as a teenager.  Quit 1970. Alcohol use-no Disabled - psych Married - husband chronically ill in nursing home 2 kids Drug use-no Drug Use:  no  Review of Systems  The patient denies anorexia, fever, vision loss, decreased hearing, hoarseness, chest pain, syncope, dyspnea on exertion, peripheral edema, prolonged cough, headaches, hemoptysis, abdominal pain, melena, hematochezia, severe indigestion/heartburn, hematuria, muscle weakness, suspicious skin lesions, transient blindness, difficulty walking, unusual weight change, abnormal bleeding, enlarged lymph nodes, and angioedema.         all otherwise negative per pt -     Impression & Recommendations:  Problem # 1:  Preventive Health Care (ICD-V70.0) Overall doing well, age appropriate education and counseling updated, referral for preventive services and immunizations addressed, dietary counseling and smoking status adressed , most recent labs reviewed I have personally reviewed and have noted 1.The patient's medical and social history 2.Their use of alcohol, tobacco or illicit drugs 3.Their current medications and supplements 4. Functional ability including ADL's, fall risk, home safety risk, hearing & visual impairment  5.Diet and physical  activities 6.Evidence for depression or mood disorders The patients weight, height, BMI  have been recorded in the chart I have made referrals, counseling and provided education to the patient based review of the above   Problem # 2:  HYPERTHYROIDISM (ICD-242.90)  Her updated medication list for this problem includes:    Atenolol 25 Mg Tabs (Atenolol) .[name]... 1 by mouth once daily apparently ioatrogenic - to stop the thyroid med , check TSH in 4 wks, d/w pt danger of taking thyroid replacement when not needed can lead to osteoporosis or afib  Labs Reviewed: TSH: 0.04 (02/14/2011)     Problem # 3:  OTHER DYSPHAGIA (ICD-787.29)  several months, vague, unclear etiology, no hx of central neuro problem , no aspiration - new onset - for GI referral  Orders: Gastroenterology Referral (GI)  Problem # 4:  HYPERLIPIDEMIA (ICD-272.4)  The following medications were removed from the medication list:    Crestor 40 Mg Tabs (Rosuvastatin calcium) .[name]... 1/2 by mouth once daily pt not taking the crestor - ok to stay off for nowl  Pt to continue diet efforts, good med tolerance; to check labs - goal LDL less than 70   Labs Reviewed: SGOT: 14 (02/14/2011)   SGPT: 19 (02/14/2011)   HDL:43.00 (02/14/2011), 62.70 (02/06/2010)  LDL:112 (02/14/2011), 43 (16/09/9603)  Chol:179 (02/14/2011), 139 (02/06/2010)  Trig:120.0 (02/14/2011), 168.0 (02/06/2010)  Problem # 5:  FAMILY HISTORY OF COLON CA 1ST DEGREE RELATIVE <60 (ICD-V16.0)  mother with colon cancer hx - for colonoscopy as she is due, pt wishes to change GI to   Orders: Gastroenterology Referral (GI)  Problem # 6:  BRONCHITIS-ACUTE (ICD-466.0)  Her updated medication list for this problem includes:    Proair Hfa 108 (90 Base) Mcg/act Aers (Albuterol sulfate) .[name]... 2 puffs four times per day as needed    Azithromycin 250 Mg Tabs (Azithromycin) .[name]... 2po qd for 1 day, then 1po qd for 4days, then stop treat as above, f/u any worsening signs  or symptoms   Complete Medication List: 1)  Clonazepam 1 Mg Tabs (Clonazepam) .... 1/2  - 1 by mouth three times a day prn 2)  Adult Aspirin Low Strength 81 Mg Tbdp (Aspirin) .[name].. 1po qd 3)  Atenolol 25 Mg Tabs (Atenolol) .[name].. 1 by mouth once daily 4)  Pristiq 100 Mg Xr24h-tab (Desvenlafaxine succinate) .... Take 1 by mouth qd 5)  Trazodone Hcl 100 Mg Tabs (Trazodone hcl) .... 3  by mouth at bedtime 6)  Proair Hfa 108 (90 Base) Mcg/act Aers (Albuterol sulfate) .... 2 puffs four times per day as needed 7)  Fluticasone Propionate 50 Mcg/act Susp (Fluticasone propionate) .... 2 spray once daily as needed 8)  Vitamin D 2000 Unit Tabs (Cholecalciferol) .[name].. 1 by mouth once daily 9)  Morphine Sulfate 15 Mg Tabs (Morphine sulfate) .[name].. 1 by mouth three times a day as needed 10)  Lamictal 100 Mg Tabs (Lamotrigine) .[name].. 1 by mouth once daily 11)  Azithromycin 250 Mg Tabs (Azithromycin) .... 2po qd for 1 day, then 1po qd for 4days, then stop  Patient Instructions: 1)  please stop the thyroid medication 2)  please return in 4 wks for LAB only: TSH 244.8 3)  You will be contacted about the referral(s) to: GI for the problem swallowing, and the colonoscopy 4)  Please take all new medications as prescribed - the antibiotic (sent to the pharmacy) 5)  Continue all previous medications as before this visit  6)  Please schedule a follow-up appointment in 6 months, or sooner if needed Prescriptions: AZITHROMYCIN 250 MG TABS (AZITHROMYCIN) 2po qd for 1 day, then 1po qd for 4days, then stop  #6 x 1   Entered and Authorized by:   Corwin Levins MD   Signed by:   Corwin Levins MD on 02/15/2011   Method used:   Electronically to        [facility]  [phone]* (retail)       [address]       [place], [place]  [address]       Ph: [phone] or [phone]       Fax: [phone]   RxID:   [id number]    Orders Added: 1)  Gastroenterology Referral [GI] 2)   Gastroenterology Referral [GI] 3)  Est. Patient 65& > [36644] 4)  Est. Patient Level IV [03474]

## 2011-02-21 LAB — RAPID URINE DRUG SCREEN, HOSP PERFORMED
Amphetamines: NOT DETECTED
Cocaine: NOT DETECTED
Opiates: NOT DETECTED
Tetrahydrocannabinol: NOT DETECTED

## 2011-02-21 LAB — BASIC METABOLIC PANEL
BUN: 14 mg/dL (ref 6–23)
CO2: 23 mEq/L (ref 19–32)
Chloride: 110 mEq/L (ref 96–112)
Potassium: 3.6 mEq/L (ref 3.5–5.1)

## 2011-02-21 LAB — URINALYSIS, ROUTINE W REFLEX MICROSCOPIC
Bilirubin Urine: NEGATIVE
Hgb urine dipstick: NEGATIVE
Specific Gravity, Urine: 1.023 (ref 1.005–1.030)
pH: 5.5 (ref 5.0–8.0)

## 2011-02-21 LAB — CBC
HCT: 41.2 % (ref 36.0–46.0)
MCH: 31.5 pg (ref 26.0–34.0)
MCV: 91.2 fL (ref 78.0–100.0)
RDW: 12.8 % (ref 11.5–15.5)
WBC: 10.6 10*3/uL — ABNORMAL HIGH (ref 4.0–10.5)

## 2011-02-21 LAB — DIFFERENTIAL
Basophils Absolute: 0 10*3/uL (ref 0.0–0.1)
Eosinophils Absolute: 0.1 10*3/uL (ref 0.0–0.7)
Eosinophils Relative: 1 % (ref 0–5)
Lymphocytes Relative: 28 % (ref 12–46)
Lymphs Abs: 3 10*3/uL (ref 0.7–4.0)
Monocytes Absolute: 0.8 10*3/uL (ref 0.1–1.0)

## 2011-02-21 LAB — ETHANOL: Alcohol, Ethyl (B): <5 mg/dL (ref 0–10)

## 2011-02-21 LAB — RPR: RPR Ser Ql: NONREACTIVE

## 2011-02-21 LAB — VITAMIN B12: Vitamin B-12: 512 pg/mL (ref 211–911)

## 2011-02-21 LAB — TSH: TSH: 0.033 u[IU]/mL — ABNORMAL LOW (ref 0.350–4.500)

## 2011-02-26 NOTE — Letter (Signed)
Summary: New Patient letter  [facility]  [address] [place], [place] [address]   Phone: [phone]  Fax: [phone]       02/18/2011 MRN: [id number]  [facility] [address] [place], [place]  [address]  [place]  Dear Ms. Cabriales,  Welcome to the Gastroenterology Division at [facility].    You are scheduled to see Dr.  Melvia Heaps on March 20, 2011 at 2:00pm on the 3rd floor at [facility], [address]  We ask that you try to arrive at our office 15 minutes prior to your appointment time to allow for check-in.  We would like you to complete the enclosed self-administered evaluation form prior to your visit and bring it with you on the day of your appointment.  We will review it with you.  Also, please bring a complete list of all your medications or, if you prefer, bring the medication bottles and we will list them.  Please bring your insurance card so that we may make a copy of it.  If your insurance requires a referral to see a specialist, please bring your referral form from your primary care physician.  Co-payments are due at the time of your visit and may be paid by cash, check or credit card.     Your office visit will consist of a consult with your physician (includes a physical exam), any laboratory testing he/she may order, scheduling of any necessary diagnostic testing (e.g. x-ray, ultrasound, CT-scan), and scheduling of a procedure (e.g. Endoscopy, Colonoscopy) if required.  Please allow enough time on your schedule to allow for any/all of these possibilities.    If you cannot keep your appointment, please call [phone] to cancel or reschedule prior to your appointment date.  This allows [place] the opportunity to schedule an appointment for another patient in need of care.  If you do not cancel or reschedule by 5 p.m. the business day prior to your appointment date, you will be charged a $50.00 late cancellation/no-show fee.     Thank you for choosing [facility] for your medical needs.  We appreciate the opportunity to care for you.  Please visit [place] at our website  to learn more about our practice.                     Sincerely,                                                             The Gastroenterology Division

## 2011-03-03 LAB — CBC
Hemoglobin: 14.9 g/dL (ref 12.0–15.0)
RBC: 4.81 MIL/uL (ref 3.87–5.11)
WBC: 13.4 10*3/uL — ABNORMAL HIGH (ref 4.0–10.5)

## 2011-03-03 LAB — COMPREHENSIVE METABOLIC PANEL
ALT: 15 U/L (ref 0–35)
AST: 18 U/L (ref 0–37)
Alkaline Phosphatase: 76 U/L (ref 39–117)
CO2: 29 mEq/L (ref 19–32)
Calcium: 9.5 mg/dL (ref 8.4–10.5)
Chloride: 103 mEq/L (ref 96–112)
GFR calc Af Amer: >60 mL/min (ref >60)
GFR calc non Af Amer: >60 mL/min (ref >60)
Glucose, Bld: 106 mg/dL — ABNORMAL HIGH (ref 70–99)
Potassium: 4.2 mEq/L (ref 3.5–5.1)
Sodium: 140 mEq/L (ref 135–145)

## 2011-03-03 LAB — TSH: TSH: 6.263 u[IU]/mL — ABNORMAL HIGH (ref 0.350–4.500)

## 2011-03-12 LAB — DIFFERENTIAL
Basophils Absolute: 0 10*3/uL (ref 0.0–0.1)
Basophils Relative: 0 % (ref 0–1)
Eosinophils Absolute: 0.2 10*3/uL (ref 0.0–0.7)
Eosinophils Relative: 2 % (ref 0–5)
Lymphocytes Relative: 33 % (ref 12–46)
Lymphs Abs: 3 10*3/uL (ref 0.7–4.0)
Monocytes Absolute: 0.6 10*3/uL (ref 0.1–1.0)
Monocytes Relative: 7 % (ref 3–12)
Neutro Abs: 5.4 10*3/uL (ref 1.7–7.7)
Neutrophils Relative %: 59 % (ref 43–77)

## 2011-03-12 LAB — BASIC METABOLIC PANEL
BUN: 12 mg/dL (ref 6–23)
CO2: 22 mEq/L (ref 19–32)
Calcium: 9.2 mg/dL (ref 8.4–10.5)
Chloride: 108 mEq/L (ref 96–112)
Creatinine, Ser: 0.7 mg/dL (ref 0.4–1.2)
GFR calc Af Amer: >60 mL/min (ref >60)
GFR calc non Af Amer: >60 mL/min (ref >60)
Glucose, Bld: 99 mg/dL (ref 70–99)
Potassium: 3.9 mEq/L (ref 3.5–5.1)
Sodium: 139 mEq/L (ref 135–145)

## 2011-03-12 LAB — CBC
HCT: 39.8 % (ref 36.0–46.0)
Hemoglobin: 13.8 g/dL (ref 12.0–15.0)
MCHC: 34.7 g/dL (ref 30.0–36.0)
MCV: 91 fL (ref 78.0–100.0)
Platelets: 233 10*3/uL (ref 150–400)
RBC: 4.37 MIL/uL (ref 3.87–5.11)
RDW: 12.4 % (ref 11.5–15.5)
WBC: 9.2 10*3/uL (ref 4.0–10.5)

## 2011-03-12 LAB — HEPATIC FUNCTION PANEL
ALT: 14 U/L (ref 0–35)
AST: 22 U/L (ref 0–37)
Albumin: 4.2 g/dL (ref 3.5–5.2)
Alkaline Phosphatase: 68 U/L (ref 39–117)
Bilirubin, Direct: <0.1 mg/dL (ref 0.0–0.3)
Total Bilirubin: 0.5 mg/dL (ref 0.3–1.2)
Total Protein: 6.3 g/dL (ref 6.0–8.3)

## 2011-03-12 LAB — CK TOTAL AND CKMB (NOT AT ARMC)
CK, MB: 2.1 ng/mL (ref 0.3–4.0)
Relative Index: INVALID (ref 0.0–2.5)
Total CK: 88 U/L (ref 7–177)

## 2011-03-20 ENCOUNTER — Ambulatory Visit: Payer: Medicare Other | Admitting: Gastroenterology

## 2011-03-22 ENCOUNTER — Other Ambulatory Visit (INDEPENDENT_AMBULATORY_CARE_PROVIDER_SITE_OTHER): Payer: Medicare Other

## 2011-03-22 ENCOUNTER — Other Ambulatory Visit: Payer: Self-pay | Admitting: Internal Medicine

## 2011-03-22 DIAGNOSIS — E038 Other specified hypothyroidism: Secondary | ICD-10-CM

## 2011-03-22 LAB — TSH: TSH: 4.94 u[IU]/mL (ref 0.35–5.50)

## 2011-03-22 NOTE — Progress Notes (Signed)
Quick Note:  Voice message left on [employer] system - lab is negative, normal or otherwise stable, pt to continue same tx ______ 

## 2011-03-26 LAB — DIFFERENTIAL
Eosinophils Absolute: 0 10*3/uL (ref 0.0–0.7)
Lymphs Abs: 2.6 10*3/uL (ref 0.7–4.0)
Monocytes Absolute: 0.7 10*3/uL (ref 0.1–1.0)
Monocytes Relative: 6 % (ref 3–12)
Neutrophils Relative %: 72 % (ref 43–77)

## 2011-03-26 LAB — COMPREHENSIVE METABOLIC PANEL
ALT: 12 U/L (ref 0–35)
AST: 20 U/L (ref 0–37)
Albumin: 3.8 g/dL (ref 3.5–5.2)
Calcium: 9.2 mg/dL (ref 8.4–10.5)
GFR calc Af Amer: >60 mL/min (ref >60)
Glucose, Bld: 130 mg/dL — ABNORMAL HIGH (ref 70–99)
Potassium: 4.1 mEq/L (ref 3.5–5.1)
Sodium: 137 mEq/L (ref 135–145)
Total Protein: 6.1 g/dL (ref 6.0–8.3)

## 2011-03-26 LAB — URINALYSIS, ROUTINE W REFLEX MICROSCOPIC
Glucose, UA: NEGATIVE mg/dL
Hgb urine dipstick: NEGATIVE
Ketones, ur: NEGATIVE mg/dL
pH: 7.5 (ref 5.0–8.0)

## 2011-03-26 LAB — CBC
MCHC: 35.3 g/dL (ref 30.0–36.0)
Platelets: 258 10*3/uL (ref 150–400)
RDW: 12.3 % (ref 11.5–15.5)

## 2011-03-26 LAB — URINE MICROSCOPIC-ADD ON

## 2011-03-26 LAB — URINE CULTURE: Colony Count: >=100000

## 2011-03-26 LAB — POCT CARDIAC MARKERS
CKMB, poc: 1.2 ng/mL (ref 1.0–8.0)
Myoglobin, poc: 54.9 ng/mL (ref 12–200)
Myoglobin, poc: 58.5 ng/mL (ref 12–200)
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
## 2011-04-26 NOTE — Discharge Summary (Signed)
NAME:  Alexandra Wolfe, Alexandra Wolfe                        ACCOUNT NO.:  [id number]   MEDICAL RECORD NO.:  [id number]                   PATIENT TYPE:  IPS   LOCATION:  0502                                 FACILITY:  [facility]   PHYSICIAN:  Jeanice Lim, M.D.              DATE OF BIRTH:  15-Jan-1946   DATE OF ADMISSION:  08/02/2003  DATE OF DISCHARGE:  08/09/2003                                 DISCHARGE SUMMARY   IDENTIFYING DATA:  This is a 65 year old married Caucasian female  voluntarily admitted, presenting with history of depression increasing with  anxiety and inability to cope.  Caregiver for husband, who had had a stroke,  and also caring for her son's new child.  Very fatigued, feeling  overwhelmed, experiencing auditory hallucinations at times, hearing mother's  voice, which says positive things.  Sleep has been decreased.   MEDICATIONS:  Zoloft 200 mg q.a.m. (on for two years), Lenoxin 0.25 mg daily  and Klonopin 1 mg b.i.d. for years.  The patient had been on Wellbutrin,  Paxil, Prozac, lithium and Lexapro in the past.   PHYSICAL EXAMINATION:  Essentially within normal limits.  Neurologically  nonfocal.   LABORATORY DATA:  Routine admission labs essentially within normal limits.  White count slightly elevated at 10.6, potassium 3.4 (slightly low).  TSH  within normal limits.  Urine drug screen negative.   MENTAL STATUS EXAM:  Alert, middle-aged female, neat, cooperative.  Speech  clear.  Mood depressed.  Affect flat.  Thought processes goal directed.  Positive auditory hallucinations.  Denying any at the time of the  evaluation.  No other psychotic symptoms and no dangerous ideation.  Cognitively intact.  Judgment and insight fair to poor.   ADMISSION DIAGNOSES:   AXIS I:  Major depressive disorder, recurrent, severe.   AXIS II:  Deferred.   AXIS III:  1. History of heart palpitations.  2. Osteoarthritis.   AXIS IV:  Severe (problems with primary support group, ill  husband, primary  caregiver, economic problems and other psychosocial stressors.   AXIS V:  30/60.   HOSPITAL COURSE:  The patient was admitted and ordered routine p.r.n.  medications and underwent further monitoring.  Was encouraged to participate  in individual, group and milieu therapy.  The patient was resumed on  psychotropics and medical medications and a digoxin level was obtained.  The  patient was adjusted on Zyprexa and Seroquel to restore sleep and Valium for  acute anxiety.  Zyprexa was optimized to target psychotic symptoms including  voices and patient reported a gradual response to medication changes and  clinical intervention.   CONDITION ON DISCHARGE:  Markedly improved.  Mood was more euthymic.  Affect  brighter.  Thought processes goal directed.  Thought content negative for  dangerous ideation or psychotic symptoms.  The patient no longer heard  voices.  Reported no suicidal thoughts.  Reporting motivation to be able to  deal with  the stressors in a more healthy manner.   DISCHARGE MEDICATIONS:  1. Zoloft 100 mg, 2 q.a.m.  2. Vioxx 12.5 mg p.r.n.  3. Digoxin 0.25 mg daily.  4. Estrace 0.5 mg daily.  5. Lamictal 25 mg daily.  6. Klonopin 0.5 mg q.i.d. p.r.n.   FOLLOW UP:  The patient was to follow up with Dr. Leone Haven on  August 18, 2003 at 11:30 a.m.   DISCHARGE DIAGNOSES:   AXIS I:  Major depressive disorder, recurrent, severe.   AXIS II:  Deferred.   AXIS III:  1. History of heart palpitations.  2. Osteoarthritis.   AXIS IV:  Severe (problems with primary support group, ill husband, primary  caregiver, economic problems and other psychosocial stressors.   AXIS V:  Global Assessment of Functioning on discharge 50-55.                                               Jeanice Lim, M.D.    JEM/MEDQ  D:  09/12/2003  T:  09/12/2003  Job:  [id number]

## 2011-04-26 NOTE — Op Note (Signed)
[place]  Patient:    Alexandra Wolfe, Alexandra Wolfe                    MRN: [id number] Proc. Date: 12/31/00 Attending:  Verlin Grills, M.D. CC:         Alexandra Wolfe, M.D. Alexandra Wolfe   Operative Report  DATE OF BIRTH:  1946-05-26  PROCEDURE PERFORMED:  ENDOSCOPIST:  Verlin Grills, M.D.  INDICATIONS FOR PROCEDURE:  [name] is a 65 year old female who is due for surveillance colonoscopy and polypectomy to prevent colon cancer. Her 38 year old mother was recently diagnosed with metastatic colon cancer and required a segmental colonic resection.  [name] viewed our colonoscopy education film.  I discussed with the patient the complications associated with colonoscopy and polypectomy including a 1:1000 risk of intestinal bleeding and 4:1000 risk of colonic rupture requiring emergency surgery.  The patient has signed the operative permit.  MEDICATION ALLERGIES:  None.  CHRONIC MEDICATIONS:  Trazodone, Klonopin, Neurontin, Lanoxin, Premarin, Provera and aspirin.  PAST MEDICAL HISTORY:  Cholecystectomy, total abdominal hysterectomy with bladder tack up, lumbar laminectomy, pancreatitis in 1993, anxiety-depression, palpitations, post menopausal.  PREMEDICATION:  Demerol 100 mg, Versed 10 mg.  ENDOSCOPE:  Olympus pediatric video colonoscope.  DESCRIPTION OF PROCEDURE:  After obtaining informed consent, the patient was placed in the left lateral decubitus position.  I administered intravenous Demerol and intravenous Versed to achieve sedation for the procedure.  The patients blood pressure, oxygen saturation and cardiac rhythm were monitored throughout the procedure and documented in the medical record.  Anal inspection was normal.  Digital rectal exam was normal.  The Olympus pediatric video colonoscope was then introduced into the rectum and under direct vision, advanced to the cecum as identified by a  normal-appearing ileocecal valve.  Colonic preparation for the exam today was excellent.  Rectum:  Normal.  Sigmoid colon and descending colon:  At approximately 40 cm from the anal verge, a 1 mm sessile polyp was removed with the cold snare and submitted for pathological interpretation.  Splenic flexure:  Normal.  Transverse colon:  Normal.  Hepatic flexure:  Normal.  Ascending colon:  Normal.  Cecum and ileocecal valve:  Normal.  ASSESSMENT:  A 1 mm sessile polyp was removed from the sigmoid colon at 40 cm from the anal verge and submitted for pathological interpretation.  Otherwise normal proctocolonoscopy to the cecum.  RECOMMENDATIONS:  If colonic polyp returns neoplastic, [name] should undergo a repeat colonoscopy in five years.  If the colon polyp is non-neoplastic by pathological evaluation, [name] should undergo a repeat colonoscopy in approximately 10 years. DD:  12/31/00 TD:  12/31/00 Job: [id number]
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
## 2011-04-26 NOTE — Discharge Summary (Signed)
Alexandra Wolfe, Alexandra Wolfe NO.:  [id number]   MEDICAL RECORD NO.:  [id number]          PATIENT TYPE:  IPS   LOCATION:  0501                          FACILITY:  [facility]   PHYSICIAN:  Geoffery Lyons, M.D.      DATE OF BIRTH:  Apr 28, 1946   DATE OF ADMISSION:  03/05/2005  DATE OF DISCHARGE:  03/11/2005                                 DISCHARGE SUMMARY   CHIEF COMPLAINT AND PRESENT ILLNESS:  This was one of several admissions to  [facility] for this 65 year old married white female  involuntarily admitted. She was referred by her psychiatrist after revealing  thoughts of suicide but no specific plan. Hopeless, helpless, depression  worse in the last week prior to this admission, feeling unsafe. Endorsed  __________ due to could not take care of her husband who has dementia. Had  stopped Seroquel that she was taking.   PAST PSYCHIATRIC HISTORY:  Fourth time at [facility]. Last admission  March 30 to March 13, 2004. Sees Dr. Milford Cage. Previous trial with ECT  not successful.   ALCOHOL AND DRUG HISTORY:  Denies the use or abuse of any substances.   PAST MEDICAL HISTORY:  1.  Osteoarthritis.  2.  Atrial fibrillation.   MEDICATIONS:  1.  Celexa 40 mg in the morning.  2.  Ultram 50 mg 1 to 2 every 6 hours as needed for pain.  3.  Hydrocodone 10 mg twice a day as needed for pain.  4.  Naprosyn 375 two in the morning with food.  5.  Klonopin 1 mg 3 times a day.   PHYSICAL EXAMINATION:  Physical exam performed, did not show any acute  findings.   LABORATORY DATA:  CBC:  White blood cells 9.7, hemoglobin 13.6. Blood  chemistries within normal limits. TSH 5.688. Hemoglobin A1c 5.5. Cholesterol  218, triglycerides 155. Drug screen negative for substances of abuse.   MENTAL STATUS EXAMINATION:  Reveals an alert, cooperative female, somewhat  disheveled. Some psychomotor retardation. Blunted affect. Poor hygiene.  Speech normal rate, tempo, and  production. Mood depression. Affect  depression, endorsing a sense of hopelessness and helplessness, overwhelmed,  suicidal rumination, no active plan. Could contract for safety.   ADMISSION DIAGNOSES:   AXIS I:  Major depression, recurrent.   AXIS II:  No diagnosis.   AXIS III:  1.  Osteoarthritis.  2.  Atrial fibrillation.   AXIS IV:  Moderate.   AXIS V:  Global Assessment of Functioning upon admission 25-30, highest  Global Assessment of Functioning in the last year 65-70.   COURSE IN THE HOSPITAL:  She was admitted and started in individual and  group psychotherapy. She was given Ambien for sleep, initially maintained on  Celexa 40 mg in the morning, Ultram 50 one to two tablets every 6 hours as  needed for pain, Naprosyn 375 two tablets in the morning, Klonopin 1 mg 3  times a day. She was on Norco 10/500 one twice a day as needed. She was  placed on Abilify 50 mg at night, Ativan 2 mg at night. Initially, Celexa  was  increased to 40, but given the fact that she has been on Celexa and was  not effective, we started weaning off the Celexa and started Cymbalta that  she tolerated up to 60 mg per day. Celexa was discontinued. Initially, she  did endorse increased signs and symptoms of depression and that she was  doing better, and she stated that she was doing downhill again. Endorsed  feeling overwhelmed, trying to take care of her husband who has developed  some symptoms of vascular dementia. Endorsed that she was burned out.  Dealing with the demands of the husband, endorsed that the children were not  supportive and if anything a source of stress for her. The depression  continued to escalate. She was isolating, very despondent, evidencing  psychomotor retardation, isolating. She was started on Cytomel. TSH was  5.688. On April 2, she endorsed that she was starting to feel better than  when she was admitted. She was able to talk about how overwhelmed she was.  On April 3,  she was much better. Her mood improved. Her affect was brighter.  She was more hopeful, wanting to give the medication a try. Upon discharge,  in full contact with reality, no suicidal or homicidal ideations.   DISCHARGE DIAGNOSES:   AXIS I:  Bipolar disorder, depressed.   AXIS II:  No diagnosis.   AXIS III:  1.  Osteoarthritis.  2.  Atrial fibrillation.   AXIS IV:  Moderate.   AXIS V:  Global Assessment of Functioning upon discharge 55-60.   DISCHARGE MEDICATIONS:  1.  Cytomel 50 mcg daily.  2.  Cymbalta 50 mg daily.  3.  Abilify 5 mg at bedtime.  4.  Ativan 1 mg and 2 at night.  5.  Estradiol 0.05 patch change every Sunday.  6.  Klonopin 1 mg 3 tablets a day.  7.  Naprosyn 375 two tablets in the morning.   FOLLOW UP:  Dr. Milford Cage.      IL/MEDQ  D:  04/29/2005  T:  04/30/2005  Job:  [id number]

## 2011-04-26 NOTE — Discharge Summary (Signed)
NAME:  Alexandra Wolfe, Alexandra Wolfe                        ACCOUNT NO.:  [id number]   MEDICAL RECORD NO.:  [id number]                   PATIENT TYPE:  IPS   LOCATION:  0506                                 FACILITY:  [facility]   PHYSICIAN:  Geoffery Lyons, M.D.                   DATE OF BIRTH:  10-31-1946   DATE OF ADMISSION:  08/18/2003  DATE OF DISCHARGE:  08/26/2003                                 DISCHARGE SUMMARY   CHIEF COMPLAINT AND PRESENT ILLNESS:  This was the second admission to [facility] for this 65 year old married white female voluntarily  admitted.  History of increasing depression, having suicidal thoughts to  overdose.  She felt that there was no reason for her to continue going on.  Stated that her stressors include that her son told her to get out of his  home.  Her son told her that she was insane.  Having mood swings, feeling  very irritable, clinging and saying hateful things to the family.  Feeling  that she could not take it anymore.   PAST PSYCHIATRIC HISTORY:  Second time at [facility].  She was  hospitalized three weeks prior to this admission for depression and positive  auditory hallucinations.   ALCOHOL/DRUG HISTORY:  Denies the use or abuse of any substances.   PAST MEDICAL HISTORY:  Heart palpitations and osteoarthritis.   MEDICATIONS:  Estradiol 0.5 mg patch, Zoloft 200 mg daily, Lanoxin 0.25 mg  daily, Lamictal 25 mg daily and Bextra.   PHYSICAL EXAMINATION:  Performed and failed to show any acute findings.   LABORATORY DATA:  No new labs were ordered as she had recently had the basic  laboratory workup and they were all within normal limits.   MENTAL STATUS EXAM:  Alert and cooperative, middle-aged female.  Casually  dressed.  Fair eye contact.  Speech was clear and soft-spoken.  Mood was  depressed.  Affect was depressed, tearful.  Thought processes are coherent.  There is evidence of hopelessness, helplessness, suicidal ruminations,  nothing why to go on, feeling very overwhelmed.  Cognition well-preserved.   ADMISSION DIAGNOSES:   AXIS I:  Major depression, recurrent, versus bipolar disorder, depressed.   AXIS II:  No diagnosis.   AXIS III:  1. Heart palpitations.  2. Osteoarthritis.   AXIS IV:  Moderate.   AXIS V:  Global Assessment of Functioning upon admission 30; highest Global  Assessment of Functioning in the last year 65.   HOSPITAL COURSE:  She was admitted and started intensive individual and  group psychotherapy.  We went ahead and reviewed medications.  She was  maintained on Lanoxin 0.25 mg daily, estradiol 0.5 mg patch, Zoloft 200 mg  daily, Lamictal 25 mg every day.  She was given some Klonopin for the severe  anxiety.  She continued to endorse persistent anxiety and depression, some  mood fluctuations.  There was evidence of  psychomotor retardation, staying  in bed, tearful, isolating, feeling hopeless, helpless with no support.  We  went ahead and started switching to Effexor 37.5 mg daily as we started  decreasing the Zoloft.  She continued to endorse persistent depression,  feeling that she was not getting any better, hopeless, helpless.  Multiple  medication trials that were not effective.  Very frustrated as she wanted to  feel better.  As there were multiple medication trials and she had not been  able to achieve remission of her depression for any significant period of  time, we went ahead and started considering ECT.  We continued to decrease  the Zoloft and continue to increase the Effexor.  Continued to evidence the  sense of being overwhelmed, not supported, feeling that she was in a dark  hole.  She was also experiencing the anger and irritability.  Continued to  be anxious, labile, tearful, depressed mood, a sense of hopelessness and  helplessness.  Going along with the medication change but still feeling that  things were not going to work out for her.  The Klonopin seemed to  have  helped some with the anxiety and she started sleeping better but continued  to endorse the depression.  We went ahead and presented the case to [facility] for ECT and they approved her as there were multiple attempts to  treat her with medications and they were unsuccessful.  On August 26, 2003, she still was persistently depressed, suicidal ruminations, minimal  response to medication and she was transferred to [facility] for ECT.   DISCHARGE DIAGNOSES:   AXIS I:  Bipolar disorder, depressed, versus major depression, recurrent,  severe.   AXIS II:  No diagnosis.   AXIS III:  1. Heart palpitations.  2. Osteoarthritis.   AXIS IV:  Moderate.   AXIS V:  Global Assessment of Functioning upon discharge 45.   DISCHARGE MEDICATIONS:  1. Klonopin 0.5 mg three times a day and 1 mg at night.  2. Lanoxin 0.25 mg daily.  3. Estradiol 0.05 mg every seven days.  4. Lamictal 25 mg twice a day.  5. Vioxx 25 mg daily.  6. Effexor XR 75 mg daily and 37.5 mg in the afternoon.  7.     Seroquel 25 mg, 2 at bedtime.  8. Zoloft 75 mg daily.   FOLLOW UP:  To be transferred to [facility] for ECT.                                               Geoffery Lyons, M.D.    IL/MEDQ  D:  09/21/2003  T:  09/22/2003  Job:  [id number]
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
## 2011-04-26 NOTE — Discharge Summary (Signed)
NAME:  Alexandra Wolfe, Alexandra Wolfe                        ACCOUNT NO.:  [id number]   MEDICAL RECORD NO.:  [id number]                   PATIENT TYPE:  IPS   LOCATION:  0500                                 FACILITY:  [facility]   PHYSICIAN:  Geoffery Lyons, M.D.                   DATE OF BIRTH:  Jul 02, 1946   DATE OF ADMISSION:  03/07/2004  DATE OF DISCHARGE:  03/16/2004                                 DISCHARGE SUMMARY   CHIEF COMPLAINT AND PRESENT ILLNESS:  This was the second or third admission  to [facility] for this 65 year old white married female  admitted for major depression, recurrent, severe.  She was hospitalized  several months prior to this admission and she was sent for ECT to [facility].  She had two treatments, experienced panic attacks and refused further  treatment.  She started seeing Dr. Milford Cage that recommended her to go  back to ECT but she was refusing to do so.  Felt that the patient was  worsening.  No sleep.  No appetite.  No energy.  Feeling hopeless.  She  already felt dead.  Thoughts of hate towards her daughter.  Felt not  supported.  No ideas to hurt anyone else.  She is the primary caregiver for  the husband who has suffered a stroke and she found it very depressing.   PAST PSYCHIATRIC HISTORY:  Second time at [facility].  Numerous  psychiatric medication trials.  Short trial with ECT that she did not  tolerate.   ALCOHOL/DRUG HISTORY:  Denies the use or abuse of any substances.   PAST MEDICAL HISTORY:  Denies history of any major medical conditions.   MEDICATIONS:  Digitek 0.25 mg, 2 tabs every day, hydrocodone, APAP 5\325  mg, 1-2 every 4-6 hours, _________ 0.05 mg patch, Ambien 10 mg at bedtime  for sleep, Celexa 40 mg daily, Xanax 0.5 mg three times a day.   MEDICAL HISTORY:  Positive for atrial fibrillation and osteoarthritis.   PHYSICAL EXAMINATION:  Performed and failed to show any acute findings.   LABORATORY DATA:  CBC with white  blood cells 11.04, hemoglobin 14.9.  Blood  chemistry within normal limits.  TSH within normal limits.   MENTAL STATUS EXAM:  Alert, cooperative female.  Mood depression.  Affect  depression.  Tearful.  Psychomotor retardation.  Feelings of hopelessness  and helplessness.  Not wanting to have ECT but, at the same time, feeling  that nothing is going to work. Wanting to give up.  Feels that she is  already dead.  Denied any active suicidal ideation.  Denied any active  homicidal ideation.  There were no evidence of delusions or hallucinations.  Cognition well-preserved.   ADMISSION DIAGNOSES:   AXIS I:  Major depression, recurrent, severe.   AXIS II:  Deferred.   AXIS III:  1. History of heart palpitations.  2. Osteoarthritis.   AXIS IV:  Moderate.   AXIS V:  Global Assessment of Functioning upon admission 25-30; highest  Global Assessment of Functioning in the last year 65.   HOSPITAL COURSE:  She was admitted and started intensive individual and  group psychotherapy.  Respecting her wishes not to pursue ECT, we continued  to work with the Xanax 0.5 mg three times a day, Celexa 40 mg.  We  maintained the Digitek as well as __________ and hydrocodone.  She was given  some Neurontin for anxiety and she was given some Seroquel for sleep.  We  started switching from Celexa to Cymbalta.  We decreased Celexa to 20 mg and  started Cymbalta 30 mg.  We discontinued the Xanax and tried Klonopin as it  was longer acting.  We gave her Seroquel 50 mg twice a day and 300 mg at  night for the anxiety and agitation and we increased, eventually, the  Cymbalta to 60 mg.  Initially did endorse the increased depression,  unwillingness to go through the ECT again.  Would like to pursue medications  further.  There was the sense of hopelessness and helplessness.  The  medication changes seemed to be positive.  She was evidencing a lot of  irritability and anger.  Afraid to lose control.  The  Seroquel and Neurontin  seemed to help with it.  By March 14, 2004, she was feeling a little better.  Upset because she did not have the support of her husband or her children.  Did continue to endorse depression but, overall, there seemed to be some  improvement.  On March 15, 2004, she felt she was getting better.  Sleep was  an issue secondary to roommate, unable to sleep.  On March 16, 2004, she was  in full contact with reality.  There were no suicidal ideation, no homicidal  ideation, no hallucinations, no delusions.  Felt that the medication changes  were beneficial.  Tolerated the Cymbalta well.  Was willing to give it a try  to see how much it could do for her.   DISCHARGE DIAGNOSES:   AXIS I:  Major depression, recurrent, versus bipolar disorder, type 2,  depressed.   AXIS II:  No diagnosis.   AXIS III:  1. History of heart palpitation.  2. Osteoarthritis.   AXIS IV:  Moderate.   AXIS V:  Global Assessment of Functioning upon discharge 50-55.   DISCHARGE MEDICATIONS:  1. Climara 0.5 mg per day patch.  2. Digitek 0.25 mg daily.  3. Seroquel 300 mg at night.  4. Seroquel 50 mg twice a day.  5. Cymbalta 60 mg.  6. Ambien 10 mg at bedtime for sleep.  7. Vicodin p.r.n. for pain.   FOLLOW UP:  [facility], Dr. Lolly Mustache.                                               Geoffery Lyons, M.D.    IL/MEDQ  D:  04/10/2004  T:  04/10/2004  Job:  [id number]
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
## 2011-05-20 ENCOUNTER — Ambulatory Visit (INDEPENDENT_AMBULATORY_CARE_PROVIDER_SITE_OTHER): Payer: Medicare Other | Admitting: Internal Medicine

## 2011-05-20 ENCOUNTER — Encounter: Payer: Self-pay | Admitting: Internal Medicine

## 2011-05-20 DIAGNOSIS — F3289 Other specified depressive episodes: Secondary | ICD-10-CM

## 2011-05-20 DIAGNOSIS — H6990 Unspecified Eustachian tube disorder, unspecified ear: Secondary | ICD-10-CM

## 2011-05-20 DIAGNOSIS — Z0000 Encounter for general adult medical examination without abnormal findings: Secondary | ICD-10-CM

## 2011-05-20 DIAGNOSIS — M25519 Pain in unspecified shoulder: Secondary | ICD-10-CM

## 2011-05-20 DIAGNOSIS — M545 Low back pain, unspecified: Secondary | ICD-10-CM | POA: Insufficient documentation

## 2011-05-20 DIAGNOSIS — F329 Major depressive disorder, single episode, unspecified: Secondary | ICD-10-CM

## 2011-05-20 DIAGNOSIS — H6980 Other specified disorders of Eustachian tube, unspecified ear: Secondary | ICD-10-CM

## 2011-05-20 DIAGNOSIS — H6690 Otitis media, unspecified, unspecified ear: Secondary | ICD-10-CM

## 2011-05-20 DIAGNOSIS — H6692 Otitis media, unspecified, left ear: Secondary | ICD-10-CM

## 2011-05-20 DIAGNOSIS — R42 Dizziness and giddiness: Secondary | ICD-10-CM

## 2011-05-20 DIAGNOSIS — Z0001 Encounter for general adult medical examination with abnormal findings: Secondary | ICD-10-CM | POA: Insufficient documentation

## 2011-05-20 HISTORY — DX: Other specified disorders of Eustachian tube, unspecified ear: H69.80

## 2011-05-20 HISTORY — DX: Low back pain, unspecified: M54.50

## 2011-05-20 HISTORY — DX: Unspecified eustachian tube disorder, unspecified ear: H69.90

## 2011-05-20 HISTORY — DX: Dizziness and giddiness: R42

## 2011-05-20 MED ORDER — LEVOFLOXACIN 250 MG PO TABS: 250.0000 mg | ORAL_TABLET | Freq: Every day | ORAL | Status: AC

## 2011-05-20 MED ORDER — MECLIZINE HCL 12.5 MG PO TABS: 12.5000 mg | ORAL_TABLET | Freq: Three times a day (TID) | ORAL | Status: AC | PRN

## 2011-05-20 NOTE — Assessment & Plan Note (Signed)
Mild to mod, for antibx course,  to f/u any worsening symptoms or concerns 

## 2011-05-20 NOTE — Assessment & Plan Note (Signed)
Also for mucinex otc and allegra prn

## 2011-05-20 NOTE — Assessment & Plan Note (Signed)
I encouraged pt to f/u with ortho, as she may need MRI and even surgury for ? rotater cuff injury

## 2011-05-20 NOTE — Patient Instructions (Addendum)
Take all new medications as prescribed Continue all other medications as before You can also take Delsym OTC for cough, and/or Mucinex (or it's generic off brand) for congestion, And allegra for allergies if needed Please return in 9 months with Lab testing done 3-5 days before

## 2011-05-20 NOTE — Assessment & Plan Note (Signed)
stable overall by hx and exam, most recent data reviewed with pt, and pt to continue medical treatment as before, nonsuicidal, declines need for change in med or referral

## 2011-05-20 NOTE — Assessment & Plan Note (Signed)
Mild, likely related to above, for meclizine prn ,  to f/u any worsening symptoms or concerns  

## 2011-05-20 NOTE — Progress Notes (Signed)
  Subjective:    Patient ID: Alexandra Wolfe, female    DOB: October 12, 1946, 65 y.o.   MRN: [id number]  HPI   Here with 3 days acute onset fever, left ear pain, pressure, general weakness and malaise, with slight ST, but little to no cough and Pt denies chest pain, increased sob or doe, wheezing, orthopnea, PND, increased LE swelling, palpitations, dizziness or syncope. Pt denies new neurological symptoms such as new headache, or facial or extremity weakness or numbness but has had onset vertigo with left ear pain as well, but no n/v.  Does mention left cervical radiculopathy improved symptoms after recent ESI, but stikl with signficant left shoulder pain and reduced ROM, was told she likely needed surgury but has not f/u yet for this;  Also rec'd recently for 3 level lumbar fusion, but has not done this as well as she has little finances after her husband prolonged illness (and still resides in [place]) and she has no help with getting this done for herself, worried about prolonged rehab time.   Pt denies fever, wt loss, night sweats, loss of appetite, or other constitutional symptoms except with current symptoms.   Past Medical History  Diagnosis Date  . ALLERGIC RHINITIS 04/02/2009  . HYPERLIPIDEMIA 03/11/2008  . HYPERTHYROIDISM 02/15/2011  . COLONIC POLYPS, HX OF 09/13/2007  . DEPRESSION 09/13/2007  . OSTEOARTHROSIS NOS, LOWER LEG 09/13/2007   No past surgical history on file.  reports that she has quit smoking. She does not have any smokeless tobacco history on file. She reports that she does not drink alcohol or use illicit drugs. family history is not on file. Allergies  Allergen Reactions  . Aripiprazole     REACTION: agitation  . Simvastatin     REACTION: myalgia   No current outpatient prescriptions on file prior to visit.   Review of Systems All otherwise neg per pt     Objective:   Physical Exam BP 102/62  Pulse 77  Temp(Src) 98 F (36.7 C) (Oral)  Ht 5\' 6"  (1.676 m)  Wt 201 lb 2 oz  (91.23 kg)  BMI 32.46 kg/m2  SpO2 95% Physical Exam  VS noted Constitutional: Pt appears well-developed and well-nourished.  HENT: Head: Normocephalic.  Right Ear: External ear normal.  Left Ear: External ear normal.  Bilat tm's with erythema, left > right.  Sinus nontender.  Pharynx mild erythema Eyes: Conjunctivae and EOM are normal. Pupils are equal, round, and reactive to light.  Neck: Normal range of motion. Neck supple.  Cardiovascular: Normal rate and regular rhythm.   Pulmonary/Chest: Effort normal and breath sounds normal.  Abd:  Soft, NT, non-distended, + BS Neurological: Pt is alert. No cranial nerve deficit.  Skin: Skin is warm. No erythema.  Left shoulder with decreased ROM, pain on abduction Psychiatric: Pt behavior is normal. Thought content normal. Mild dysphoric content         Assessment & Plan:

## 2011-06-21 ENCOUNTER — Other Ambulatory Visit: Payer: Self-pay | Admitting: Internal Medicine

## 2011-08-23 ENCOUNTER — Ambulatory Visit: Payer: Medicare Other | Admitting: Internal Medicine

## 2011-09-19 ENCOUNTER — Encounter: Payer: Self-pay | Admitting: Endocrinology

## 2011-09-19 ENCOUNTER — Telehealth: Payer: Self-pay

## 2011-09-19 ENCOUNTER — Ambulatory Visit (INDEPENDENT_AMBULATORY_CARE_PROVIDER_SITE_OTHER): Payer: Medicare Other | Admitting: Endocrinology

## 2011-09-19 VITALS — BP 118/82 | HR 69 | Temp 98.0°F

## 2011-09-19 DIAGNOSIS — R21 Rash and other nonspecific skin eruption: Secondary | ICD-10-CM

## 2011-09-19 MED ORDER — FLUOCINONIDE-E 0.05 % EX CREA: TOPICAL_CREAM | Freq: Three times a day (TID) | CUTANEOUS | Status: DC

## 2011-09-19 MED ORDER — METHYLPREDNISOLONE (PAK) 4 MG PO TABS: ORAL_TABLET | ORAL | Status: AC

## 2011-09-19 NOTE — Progress Notes (Signed)
  Subjective:    Patient ID: Alexandra Wolfe, female    DOB: 01/14/1946, 65 y.o.   MRN: [id number]  HPI Pt states a few days of moderate itching of the neck, and assoc rash.  sxs are present to a lesser extent at the chest and face.  She is unable to cite precip factor.  She has had 4 steroid injections within the past year. Past Medical History  Diagnosis Date  . ALLERGIC RHINITIS 04/02/2009  . HYPERLIPIDEMIA 03/11/2008  . HYPERTHYROIDISM 02/15/2011  . COLONIC POLYPS, HX OF 09/13/2007  . DEPRESSION 09/13/2007  . OSTEOARTHROSIS NOS, LOWER LEG 09/13/2007  . Chronic LBP   . SVT (supraventricular tachycardia)     Past Surgical History  Procedure Date  . Ccx   . Total abdominal hysterectomy   . Lumbar laminectomy     History   Social History  . Marital Status: Married    Spouse Name: N/A    Number of Children: 2  . Years of Education: N/A   Occupational History  . Disabled Psych.    Social History Main Topics  . Smoking status: Former Games developer  . Smokeless tobacco: Not on file   Comment: Smoked as a teenager. Quit in 1970  . Alcohol Use: No  . Drug Use: No  . Sexually Active: Not on file   Other Topics Concern  . Not on file   Social History Narrative   Husband chronically ill in nursing home.Disabled - psychiatric    Current Outpatient Prescriptions on File Prior to Visit  Medication Sig Dispense Refill  . albuterol (PROAIR HFA) 108 (90 BASE) MCG/ACT inhaler Inhale 2 puffs into the lungs 4 (four) times daily.        [name] aspirin 81 MG tablet Take 81 mg by mouth daily.        [name] atenolol (TENORMIN) 25 MG tablet TAKE ONE TABLET BY MOUTH EVERY DAY  90 tablet  1  . Cholecalciferol (VITAMIN D) 2000 UNITS tablet Take 2,000 Units by mouth daily.        . clonazePAM (KLONOPIN) 1 MG tablet 1/2-1 by mouth three times a day as needed       . desvenlafaxine (PRISTIQ) 100 MG 24 hr tablet Take 100 mg by mouth daily.        . fluticasone (FLONASE) 50 MCG/ACT nasal spray Place 2 sprays into  the nose daily.        [name] lamoTRIgine (LAMICTAL) 100 MG tablet Take 100 mg by mouth daily.        . traZODone (DESYREL) 100 MG tablet Take 100 mg by mouth 3 (three) times daily. At bedtime         Allergies  Allergen Reactions  . Aripiprazole     REACTION: agitation  . Simvastatin     REACTION: myalgia    Family History  Problem Relation Age of Onset  . Colon cancer Mother   . Heart disease Father   . Heart disease Brother   . Diabetes type II Father     BP 118/82  Pulse 69  Temp(Src) 98 F (36.7 C) (Oral)  SpO2 95%    Review of Systems Denies fever.      Objective:   Physical Exam VITAL SIGNS:  See vs page GENERAL: no distress Skin:  Moderate red rash on the anterior neck.       Assessment & Plan:  Acute rash, uncertain etiology

## 2011-09-19 NOTE — Patient Instructions (Addendum)
i have sent 2 prescriptions to your pharmacy (steroid cream, and pill). I hope you feel better soon.  If you don't feel better by next week, please call dr Jonny Ruiz.

## 2011-09-19 NOTE — Telephone Encounter (Signed)
Patient called with rash this morning on face, red, itching and painful around her eyes. She was informed Dr. Jonny Ruiz had no openings until Monday. She agreed to schedule with other MD as needs to be seen before Monday. Transferred to front to do so.

## 2011-09-25 ENCOUNTER — Other Ambulatory Visit: Payer: Self-pay | Admitting: *Deleted

## 2011-09-25 NOTE — Telephone Encounter (Signed)
Done hardcopy to dahlia 

## 2011-09-25 NOTE — Telephone Encounter (Signed)
Pt is requesting rx for shingles vaccine be sent to her pharmacy

## 2011-09-26 NOTE — Telephone Encounter (Signed)
Pt informed

## 2011-09-30 ENCOUNTER — Other Ambulatory Visit: Payer: Self-pay

## 2011-09-30 MED ORDER — LEVOCETIRIZINE DIHYDROCHLORIDE 5 MG PO TABS: 5.0000 mg | ORAL_TABLET | Freq: Every day | ORAL | Status: DC

## 2011-09-30 NOTE — Telephone Encounter (Signed)
Patient is requesting a refill on Levocetirizine  5 mg for allergies. Pharmacy would not fill as prescription had expired.

## 2011-09-30 NOTE — Telephone Encounter (Signed)
A user error has taken place: encounter opened in error, closed for administrative reasons.

## 2011-09-30 NOTE — Telephone Encounter (Signed)
Ok for refill - to [facility] 

## 2011-09-30 NOTE — Telephone Encounter (Signed)
Called the patient informed prescription requested was sent to  Her pharmacy.

## 2011-10-11 ENCOUNTER — Emergency Department (HOSPITAL_COMMUNITY)
Admission: EM | Admit: 2011-10-11 | Discharge: 2011-10-11 | Discharge status: Against Medical Advice | Payer: Medicare Other | Attending: Emergency Medicine | Admitting: Emergency Medicine

## 2011-10-11 DIAGNOSIS — F3289 Other specified depressive episodes: Secondary | ICD-10-CM | POA: Insufficient documentation

## 2011-10-11 DIAGNOSIS — F329 Major depressive disorder, single episode, unspecified: Secondary | ICD-10-CM | POA: Insufficient documentation

## 2011-10-11 DIAGNOSIS — Z79899 Other long term (current) drug therapy: Secondary | ICD-10-CM | POA: Insufficient documentation

## 2011-10-11 DIAGNOSIS — M25519 Pain in unspecified shoulder: Secondary | ICD-10-CM | POA: Insufficient documentation

## 2011-10-30 ENCOUNTER — Encounter: Payer: Self-pay | Admitting: Women's Health

## 2011-11-13 ENCOUNTER — Telehealth: Payer: Self-pay | Admitting: *Deleted

## 2011-11-13 DIAGNOSIS — Z01810 Encounter for preprocedural cardiovascular examination: Secondary | ICD-10-CM

## 2011-11-13 NOTE — Telephone Encounter (Signed)
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

## 2011-11-13 NOTE — Telephone Encounter (Signed)
Pt states that she needs letter of clearance to have left arthroscopic surgery on her shoulder sent to Dr. Ranell Patrick at [facility]. Letter needs to be faxed to Delphia Grates coordinator [phone])

## 2011-11-13 NOTE — Telephone Encounter (Signed)
Called the patient left message to call back 

## 2011-11-14 NOTE — Telephone Encounter (Signed)
Called the patient and she agreed to the stress test.

## 2011-11-14 NOTE — Telephone Encounter (Signed)
Stress test is order, pt should be called soon per [facility]

## 2011-11-18 ENCOUNTER — Other Ambulatory Visit: Payer: Self-pay | Admitting: Internal Medicine

## 2011-11-18 ENCOUNTER — Other Ambulatory Visit (INDEPENDENT_AMBULATORY_CARE_PROVIDER_SITE_OTHER): Payer: Medicare Other

## 2011-11-18 DIAGNOSIS — Z0000 Encounter for general adult medical examination without abnormal findings: Secondary | ICD-10-CM

## 2011-11-18 DIAGNOSIS — E785 Hyperlipidemia, unspecified: Secondary | ICD-10-CM

## 2011-11-18 LAB — BASIC METABOLIC PANEL
BUN: 13 mg/dL (ref 6–23)
CO2: 27 mEq/L (ref 19–32)
Calcium: 9.1 mg/dL (ref 8.4–10.5)
Chloride: 109 mEq/L (ref 96–112)
Creatinine, Ser: 0.8 mg/dL (ref 0.4–1.2)
Glucose, Bld: 145 mg/dL — ABNORMAL HIGH (ref 70–99)

## 2011-11-18 LAB — CBC WITH DIFFERENTIAL/PLATELET
Basophils Absolute: 0.1 10*3/uL (ref 0.0–0.1)
Eosinophils Absolute: 0.3 10*3/uL (ref 0.0–0.7)
Hemoglobin: 14.8 g/dL (ref 12.0–15.0)
Lymphocytes Relative: 31.1 % (ref 12.0–46.0)
MCHC: 34.5 g/dL (ref 30.0–36.0)
Monocytes Relative: 5.3 % (ref 3.0–12.0)
Neutro Abs: 7.6 10*3/uL (ref 1.4–7.7)
Neutrophils Relative %: 61.1 % (ref 43.0–77.0)
Platelets: 283 10*3/uL (ref 150.0–400.0)
RDW: 13.1 % (ref 11.5–14.6)

## 2011-11-18 LAB — URINALYSIS, ROUTINE W REFLEX MICROSCOPIC
Hgb urine dipstick: NEGATIVE
Total Protein, Urine: NEGATIVE
Urine Glucose: NEGATIVE
pH: 5.5 (ref 5.0–8.0)

## 2011-11-18 LAB — HEPATIC FUNCTION PANEL
AST: 16 U/L (ref 0–37)
Alkaline Phosphatase: 80 U/L (ref 39–117)
Bilirubin, Direct: 0.1 mg/dL (ref 0.0–0.3)
Total Bilirubin: 0.5 mg/dL (ref 0.3–1.2)

## 2011-11-18 LAB — TSH: TSH: 2.68 u[IU]/mL (ref 0.35–5.50)

## 2011-11-18 LAB — LIPID PANEL
Total CHOL/HDL Ratio: 5
Triglycerides: 131 mg/dL (ref 0.0–149.0)

## 2011-11-18 LAB — LDL CHOLESTEROL, DIRECT: Direct LDL: 143 mg/dL

## 2011-11-20 ENCOUNTER — Encounter: Payer: Self-pay | Admitting: Internal Medicine

## 2011-11-20 ENCOUNTER — Ambulatory Visit (INDEPENDENT_AMBULATORY_CARE_PROVIDER_SITE_OTHER): Payer: Medicare Other | Admitting: Internal Medicine

## 2011-11-20 VITALS — BP 110/80 | HR 72 | Temp 98.9°F | Ht 66.5 in | Wt 201.2 lb

## 2011-11-20 DIAGNOSIS — E119 Type 2 diabetes mellitus without complications: Secondary | ICD-10-CM | POA: Insufficient documentation

## 2011-11-20 DIAGNOSIS — R7309 Other abnormal glucose: Secondary | ICD-10-CM

## 2011-11-20 DIAGNOSIS — E785 Hyperlipidemia, unspecified: Secondary | ICD-10-CM

## 2011-11-20 DIAGNOSIS — R7302 Impaired glucose tolerance (oral): Secondary | ICD-10-CM

## 2011-11-20 DIAGNOSIS — D72829 Elevated white blood cell count, unspecified: Secondary | ICD-10-CM

## 2011-11-20 DIAGNOSIS — Z0000 Encounter for general adult medical examination without abnormal findings: Secondary | ICD-10-CM

## 2011-11-20 DIAGNOSIS — Z01818 Encounter for other preprocedural examination: Secondary | ICD-10-CM

## 2011-11-20 DIAGNOSIS — Z79899 Other long term (current) drug therapy: Secondary | ICD-10-CM

## 2011-11-20 HISTORY — DX: Elevated white blood cell count, unspecified: D72.829

## 2011-11-20 MED ORDER — ATORVASTATIN CALCIUM 10 MG PO TABS: 10.0000 mg | ORAL_TABLET | Freq: Every day | ORAL | Status: DC

## 2011-11-20 NOTE — Assessment & Plan Note (Signed)
For stress test tomorrow to assess for ischemia and surgical risk

## 2011-11-20 NOTE — Patient Instructions (Addendum)
Take all new medications as prescribed - the lipitor at 10 mg per day Continue all other medications as before; please have the pharmacy call with any needed refills Please return for LAB only in 4 weeks to assess for lipitor: lipids and liver tests, as well as the Hgba1c for sugar (no need for lab today) Please call the phone number [phone] (the PhoneTree System) for results of testing in 2-3 days;  When calling, simply dial the number, and when prompted enter the MRN number above (the Medical Record Number) and the # key, then the message should start. Please keep your appointments with your specialists as you have planned - your stress test tomorrow The form for surgical clearance will be faxed after stress test results available You will be contacted regarding the referral for: Dietary Please return in 1 year for your yearly visit, or sooner if needed, with Lab testing done 3-5 days before

## 2011-11-20 NOTE — Assessment & Plan Note (Addendum)

## 2011-11-20 NOTE — Assessment & Plan Note (Signed)
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

## 2011-11-20 NOTE — Assessment & Plan Note (Signed)
Uncontrolled, chronic, better but still elev after diet effort, for lipitor 10 qd, refer dietary  Lab Results  Component Value Date   LDLCALC 112* 02/14/2011   Last ldl 145 dec 2012

## 2011-11-21 ENCOUNTER — Ambulatory Visit (HOSPITAL_COMMUNITY): Payer: Medicare Other | Attending: Cardiovascular Disease | Admitting: Radiology

## 2011-11-21 VITALS — BP 128/70 | Ht 66.5 in | Wt 199.0 lb

## 2011-11-21 DIAGNOSIS — R0602 Shortness of breath: Secondary | ICD-10-CM | POA: Insufficient documentation

## 2011-11-21 DIAGNOSIS — R5383 Other fatigue: Secondary | ICD-10-CM | POA: Insufficient documentation

## 2011-11-21 DIAGNOSIS — J45909 Unspecified asthma, uncomplicated: Secondary | ICD-10-CM | POA: Insufficient documentation

## 2011-11-21 DIAGNOSIS — E785 Hyperlipidemia, unspecified: Secondary | ICD-10-CM | POA: Insufficient documentation

## 2011-11-21 DIAGNOSIS — Z01810 Encounter for preprocedural cardiovascular examination: Secondary | ICD-10-CM

## 2011-11-21 DIAGNOSIS — R079 Chest pain, unspecified: Secondary | ICD-10-CM | POA: Insufficient documentation

## 2011-11-21 DIAGNOSIS — R0609 Other forms of dyspnea: Secondary | ICD-10-CM | POA: Insufficient documentation

## 2011-11-21 DIAGNOSIS — R5381 Other malaise: Secondary | ICD-10-CM | POA: Insufficient documentation

## 2011-11-21 DIAGNOSIS — I2510 Atherosclerotic heart disease of native coronary artery without angina pectoris: Secondary | ICD-10-CM

## 2011-11-21 DIAGNOSIS — R0989 Other specified symptoms and signs involving the circulatory and respiratory systems: Secondary | ICD-10-CM | POA: Insufficient documentation

## 2011-11-21 DIAGNOSIS — Z87891 Personal history of nicotine dependence: Secondary | ICD-10-CM | POA: Insufficient documentation

## 2011-11-21 DIAGNOSIS — Z8249 Family history of ischemic heart disease and other diseases of the circulatory system: Secondary | ICD-10-CM | POA: Insufficient documentation

## 2011-11-21 MED ORDER — TECHNETIUM TC 99M TETROFOSMIN IV KIT
30.0000 | PACK | Freq: Once | INTRAVENOUS | Status: AC | PRN
  Administered 2011-11-21: 30 via INTRAVENOUS

## 2011-11-21 MED ORDER — TECHNETIUM TC 99M TETROFOSMIN IV KIT
10.0000 | PACK | Freq: Once | INTRAVENOUS | Status: AC | PRN
  Administered 2011-11-21: 10 via INTRAVENOUS

## 2011-11-21 NOTE — Progress Notes (Signed)
[facility] SITE 3 NUCLEAR MED [address] [place] [address] [phone]  Cardiology Nuclear Med Study  Alexandra Wolfe is a 65 y.o. female [id number] 1946/01/30   Nuclear Med Background Indication for Stress Test:  Evaluation for Ischemia and Pending Surgical Clearance: Left Shoulder- Dr. Malon Kindle History:  Asthmatic Bronchitis per patient,'10 Myocardial Perfusion Study: only Rest myoview done due to EKG changes> Cath: EF=65%, non-obstructive CAD, 3/11 Echo: EF=60% Cardiac Risk Factors: Family History - CAD, History of Smoking, Lipids, and borderline Diabetes Symptoms:  Chest Pain (last date of chest discomfort 3 weeks ago), DOE, Fatigue, Fatigue with Exertion and SOB   Nuclear Pre-Procedure Caffeine/Decaff Intake:  None NPO After: 10:00pm   Lungs:  Clear IV 0.9% NS with Angio Cath:  20g  IV Site: R Antecubital  IV Started by:  Stanton Kidney, EMT-P  Chest Size (in):  42 Cup Size: D  Height: 5' 6.5" (1.689 m)  Weight:  199 lb (90.266 kg)  BMI:  Body mass index is 31.64 kg/(m^2). Tech Comments:  Atenolol held > 24 hours, per patient.    Nuclear Med Study 1 or 2 day study: 1 day  Stress Test Type:  Stress  Reading MD: Charlton Haws, MD  Order Authorizing Provider:  Oliver Barre, MD  Resting Radionuclide: Technetium 104m Tetrofosmin  Resting Radionuclide Dose: 11.0 mCi   Stress Radionuclide:  Technetium 12m Tetrofosmin  Stress Radionuclide Dose: 33.0 mCi           Stress Protocol Rest HR: 66 Stress HR: 139  Rest BP: 128/70 Stress BP: 183/62  Exercise Time (min): 6:31 METS: 7.5   Predicted Max HR: 154 bpm % Max HR: 90.26 bpm Rate Pressure Product: 21308   Dose of Adenosine (mg):  n/a Dose of Lexiscan: n/a mg  Dose of Atropine (mg): n/a Dose of Dobutamine: n/a mcg/kg/min (at max HR)  Stress Test Technologist: Irean Hong, RN  Nuclear Technologist:  Domenic Polite, CNMT     Rest Procedure:  Myocardial perfusion imaging was performed at rest 45  minutes following the intravenous administration of Technetium 28m Tetrofosmin. Rest ECG: NSR with T wave changes  Stress Procedure:  The patient exercised for 6 minutes and 31 seconds, RPE=15.  The patient stopped due to DOE, Fatigue and denied any chest pain.  There were no significant ST-T wave changes from baseline. There was a rare PVC.  Technetium 60m Tetrofosmin was injected at peak exercise and myocardial perfusion imaging was performed after a brief delay. Stress ECG: No significant change from baseline ECG  QPS Raw Data Images:  Normal; no motion artifact; normal heart/lung ratio. Stress Images:  Normal homogeneous uptake in all areas of the myocardium. Rest Images:  Normal homogeneous uptake in all areas of the myocardium. Subtraction (SDS):  Normal Transient Ischemic Dilatation (Normal <1.22):  0.96 Lung/Heart Ratio (Normal <0.45):  0.32  Quantitative Gated Spect Images QGS EDV:  64 ml QGS ESV:  22 ml QGS cine images:  NL LV Function; NL Wall Motion QGS EF: 66%  Impression Exercise Capacity:  Fair exercise capacity. BP Response:  Normal blood pressure response. Clinical Symptoms:  There is dyspnea. ECG Impression:  No significant ST segment change suggestive of ischemia. Comparison with Prior Nuclear Study: No previous nuclear study performed  Overall Impression:  Normal stress nuclear study.     Charlton Haws

## 2011-11-24 NOTE — Progress Notes (Signed)
Subjective:    Patient ID: Alexandra Wolfe, female    DOB: 1946-05-13, 65 y.o.   MRN: [id number]  HPI Here for wellness and f/u;  Overall doing ok;  Pt denies CP, worsening SOB, DOE, wheezing, orthopnea, PND, worsening LE edema, palpitations, dizziness or syncope.  Pt denies neurological change such as new Headache, facial or extremity weakness.  Pt denies polydipsia, polyuria, or low sugar symptoms. Pt states overall good compliance with treatment and medications, good tolerability, and trying to follow lower cholesterol diet.  Pt denies worsening depressive symptoms, suicidal ideation or panic. No fever, wt loss, night sweats, loss of appetite, or other constitutional symptoms.  Pt states good ability with ADL's, low fall risk, home safety reviewed and adequate, no significant changes in hearing or vision, and occasionally active with exercise.  No longer taking lamictal per psychiatry. Past Medical History  Diagnosis Date  . ALLERGIC RHINITIS 04/02/2009  . HYPERLIPIDEMIA 03/11/2008  . HYPERTHYROIDISM 02/15/2011  . COLONIC POLYPS, HX OF 09/13/2007  . DEPRESSION 09/13/2007  . OSTEOARTHROSIS NOS, LOWER LEG 09/13/2007  . Chronic LBP   . SVT (supraventricular tachycardia)   . BACK PAIN 09/27/2008  . BUNIONS, BILATERAL 12/23/2007  . CHEST DISCOMFORT, ATYPICAL 11/07/2009  . CONSTIPATION 09/27/2008  . DYSPNEA ON EXERTION 02/06/2010  . Eustachian tube dysfunction 05/20/2011  . FOOT PAIN, RIGHT 09/27/2008  . LBP (low back pain) 05/20/2011  . SHOULDER PAIN, LEFT 02/14/2009  . SYMPTOM, PALPITATIONS 09/13/2007  . URINARY INCONTINENCE 08/23/2009  . Vertigo 05/20/2011  . Leukocytosis 11/20/2011   Past Surgical History  Procedure Date  . Ccx   . Total abdominal hysterectomy   . Lumbar laminectomy   . Cholecystectomy   . Lumbar laminectomy     reports that she has quit smoking. She does not have any smokeless tobacco history on file. She reports that she does not drink alcohol or use illicit drugs. family  history includes Colon cancer in her mother; Diabetes in her other; Diabetes type II in her father; and Heart disease in her brother and father. Allergies  Allergen Reactions  . Aripiprazole     REACTION: agitation  . Simvastatin     REACTION: myalgia   Current Outpatient Prescriptions on File Prior to Visit  Medication Sig Dispense Refill  . albuterol (PROAIR HFA) 108 (90 BASE) MCG/ACT inhaler Inhale 2 puffs into the lungs 4 (four) times daily.        [name] aspirin 81 MG tablet Take 81 mg by mouth daily.        [name] atenolol (TENORMIN) 25 MG tablet TAKE ONE TABLET BY MOUTH EVERY DAY  90 tablet  1  . Cholecalciferol (VITAMIN D) 2000 UNITS tablet Take 2,000 Units by mouth daily.        . clonazePAM (KLONOPIN) 1 MG tablet 1/2-1 by mouth three times a day as needed       . desvenlafaxine (PRISTIQ) 100 MG 24 hr tablet Take 100 mg by mouth daily.        . fluticasone (FLONASE) 50 MCG/ACT nasal spray Place 2 sprays into the nose daily.        [name] levocetirizine (XYZAL) 5 MG tablet Take 1 tablet (5 mg total) by mouth daily.  30 tablet  11  . traZODone (DESYREL) 100 MG tablet Take 100 mg by mouth 3 (three) times daily. At bedtime        Review of Systems Review of Systems  Constitutional: Negative for diaphoresis, activity change, appetite change and unexpected weight  change.  HENT: Negative for hearing loss, ear pain, facial swelling, mouth sores and neck stiffness.   Eyes: Negative for pain, redness and visual disturbance.  Respiratory: Negative for shortness of breath and wheezing.   Cardiovascular: Negative for chest pain and palpitations.  Gastrointestinal: Negative for diarrhea, blood in stool, abdominal distention and rectal pain.  Genitourinary: Negative for hematuria, flank pain and decreased urine volume.  Musculoskeletal: Negative for myalgias and joint swelling.  Skin: Negative for color change and wound.  Neurological: Negative for syncope and numbness.  Hematological: Negative for  adenopathy.  Psychiatric/Behavioral: Negative for hallucinations, self-injury, decreased concentration and agitation.      Objective:   Physical Exam BP 110/80  Pulse 72  Temp(Src) 98.9 F (37.2 C) (Oral)  Ht 5' 6.5" (1.689 m)  Wt 201 lb 4 oz (91.286 kg)  BMI 32.00 kg/m2  SpO2 92% Physical Exam  VS noted Constitutional: Pt is oriented to person, place, and time. Appears well-developed and well-nourished.  HENT:  Head: Normocephalic and atraumatic.  Right Ear: External ear normal.  Left Ear: External ear normal.  Nose: Nose normal.  Mouth/Throat: Oropharynx is clear and moist.  Eyes: Conjunctivae and EOM are normal. Pupils are equal, round, and reactive to light.  Neck: Normal range of motion. Neck supple. No JVD present. No tracheal deviation present.  Cardiovascular: Normal rate, regular rhythm, normal heart sounds and intact distal pulses.   Pulmonary/Chest: Effort normal and breath sounds normal.  Abdominal: Soft. Bowel sounds are normal. There is no tenderness.  Musculoskeletal: Normal range of motion. Exhibits no edema.  Lymphadenopathy:  Has no cervical adenopathy.  Neurological: Pt is alert and oriented to person, place, and time. Pt has normal reflexes. No cranial nerve deficit.  Skin: Skin is warm and dry. No rash noted.  Psychiatric:  Has  normal mood and affect. Behavior is normal.     Assessment & Plan:

## 2011-11-26 ENCOUNTER — Encounter (HOSPITAL_COMMUNITY): Payer: Self-pay | Admitting: Pharmacy Technician

## 2011-12-04 NOTE — H&P (Signed)
CC: left shoulder pain and weaknees HPI: 65 y/o female with worsening left shoulder pain and weakness secondary to rotator cuff tear and glenohumeral osteoarthritis. Pt elected for a clean out procedure to decrease pain Allergies: NKDA Social: no etoh, no smoking, married Meds: atenolol, pristiq, clonazepam, trazodone PMH: hypertension, fibromyalgia, chronic pain, depression PE: alert and appropriate 65 y/o female in no acute distress Cervical: full rom cranial nerves 2-12 intact Left shoulder: mild guarding with rom, rom mildly decreased from full, strength 4.5/5 with er and IR nv intact distally bilaterally X-rays: left shoulder glenohumeral osteoarthritis with supraspinatus tear Assessment: left shoulder pain with rotator cuff tear Plan: left shoulder arthroscopy and rotator cuff repair to decrease pain and increase function

## 2011-12-09 ENCOUNTER — Encounter (HOSPITAL_COMMUNITY): Payer: Self-pay

## 2011-12-09 ENCOUNTER — Ambulatory Visit (HOSPITAL_COMMUNITY)
Admission: RE | Admit: 2011-12-09 | Discharge: 2011-12-09 | Disposition: A | Discharge status: Home / Self Care | Payer: Medicare Other | Source: Ambulatory Visit | Attending: Anesthesiology | Admitting: Anesthesiology

## 2011-12-09 ENCOUNTER — Encounter (HOSPITAL_COMMUNITY)
Admission: RE | Admit: 2011-12-09 | Discharge: 2011-12-09 | Disposition: A | Discharge status: Home / Self Care | Payer: Medicare Other | Source: Ambulatory Visit | Attending: Orthopedic Surgery | Admitting: Orthopedic Surgery

## 2011-12-09 DIAGNOSIS — E119 Type 2 diabetes mellitus without complications: Secondary | ICD-10-CM | POA: Insufficient documentation

## 2011-12-09 DIAGNOSIS — Z01818 Encounter for other preprocedural examination: Secondary | ICD-10-CM | POA: Insufficient documentation

## 2011-12-09 DIAGNOSIS — Z01812 Encounter for preprocedural laboratory examination: Secondary | ICD-10-CM | POA: Insufficient documentation

## 2011-12-09 DIAGNOSIS — Z87891 Personal history of nicotine dependence: Secondary | ICD-10-CM | POA: Insufficient documentation

## 2011-12-09 LAB — CBC
Hemoglobin: 15.1 g/dL — ABNORMAL HIGH (ref 12.0–15.0)
MCH: 31.3 pg (ref 26.0–34.0)
MCHC: 34.8 g/dL (ref 30.0–36.0)
RDW: 12.8 % (ref 11.5–15.5)

## 2011-12-09 LAB — BASIC METABOLIC PANEL
BUN: 15 mg/dL (ref 6–23)
Creatinine, Ser: 0.92 mg/dL (ref 0.50–1.10)
GFR calc Af Amer: 74 mL/min — ABNORMAL LOW (ref >90)
GFR calc non Af Amer: 63 mL/min — ABNORMAL LOW (ref >90)
Glucose, Bld: 100 mg/dL — ABNORMAL HIGH (ref 70–99)
Potassium: 5.1 mEq/L (ref 3.5–5.1)

## 2011-12-09 LAB — SURGICAL PCR SCREEN: MRSA, PCR: NEGATIVE

## 2011-12-09 IMAGING — CR DG CHEST 2V
2 series · 2 of 2 positions shown · non-contrast
Comparison: [DATE].

CLINICAL DATA: Preoperative evaluation.  Diabetes.  Former smoker.

CHEST - 2 VIEW

[view not recorded (1 of 2)]
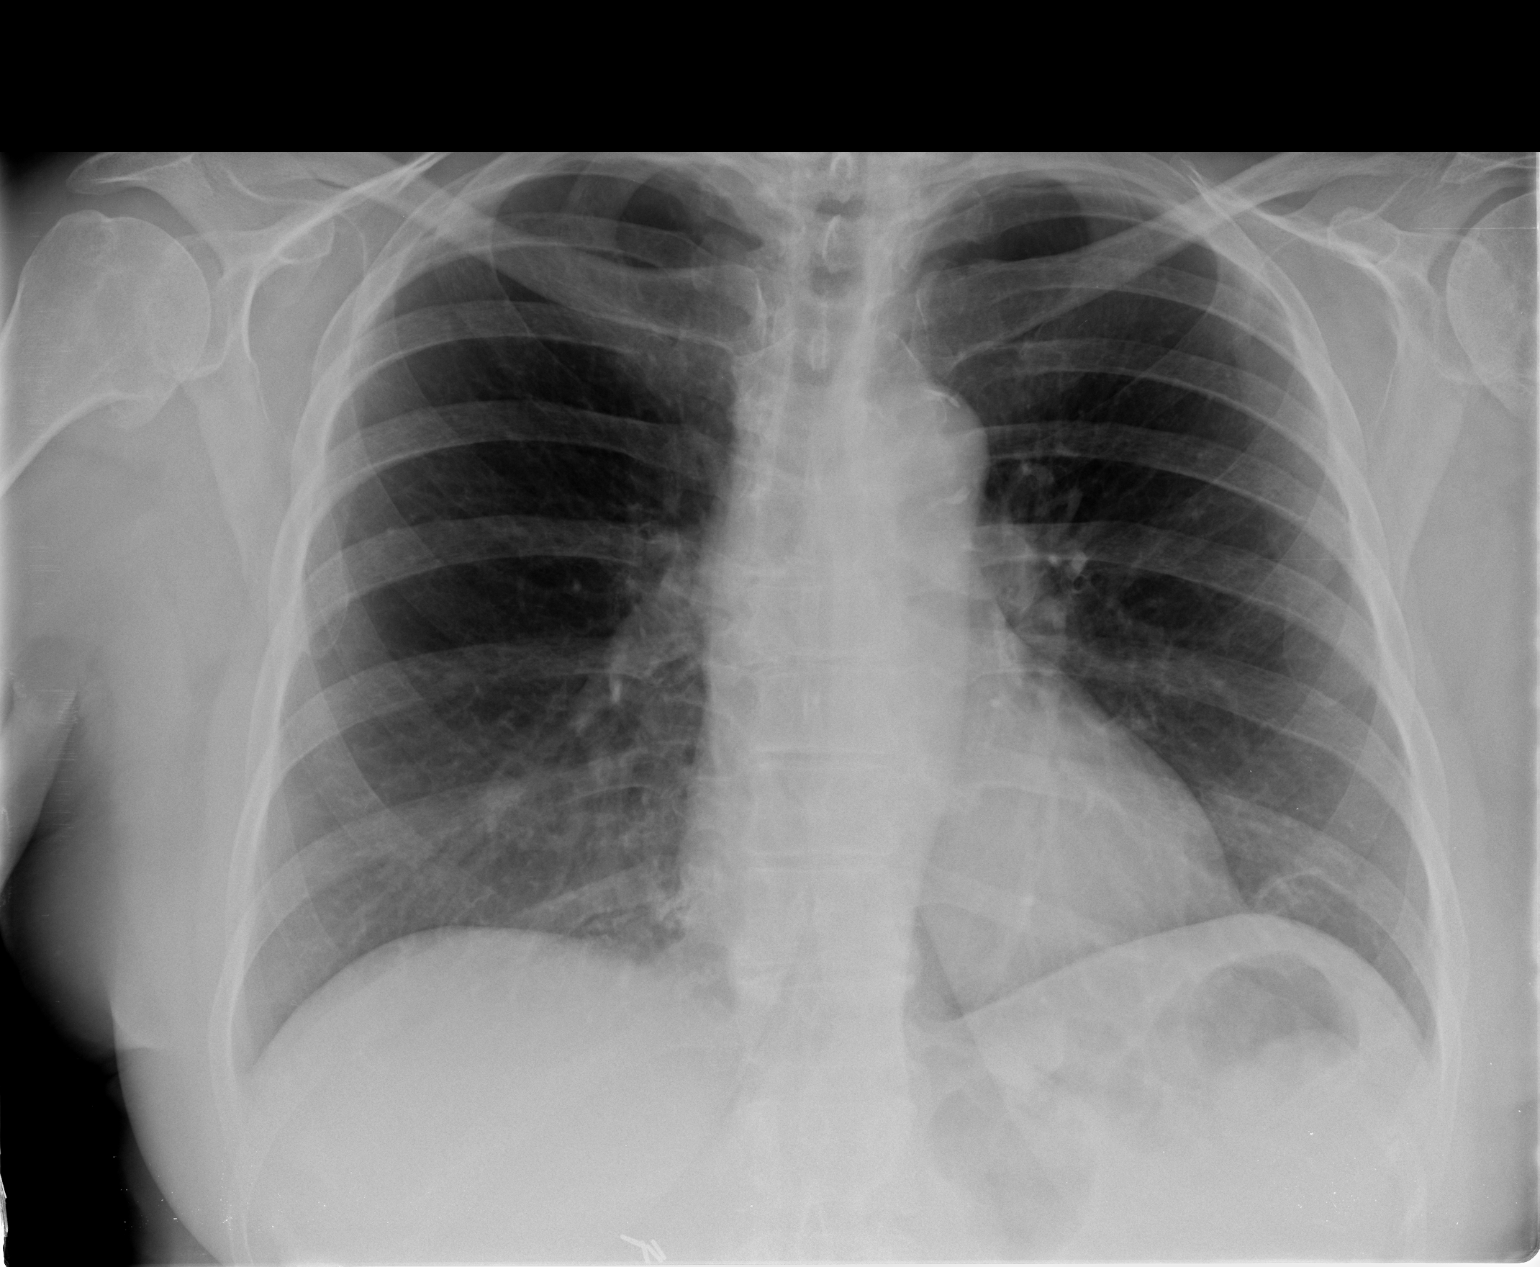

[view not recorded (2 of 2)]
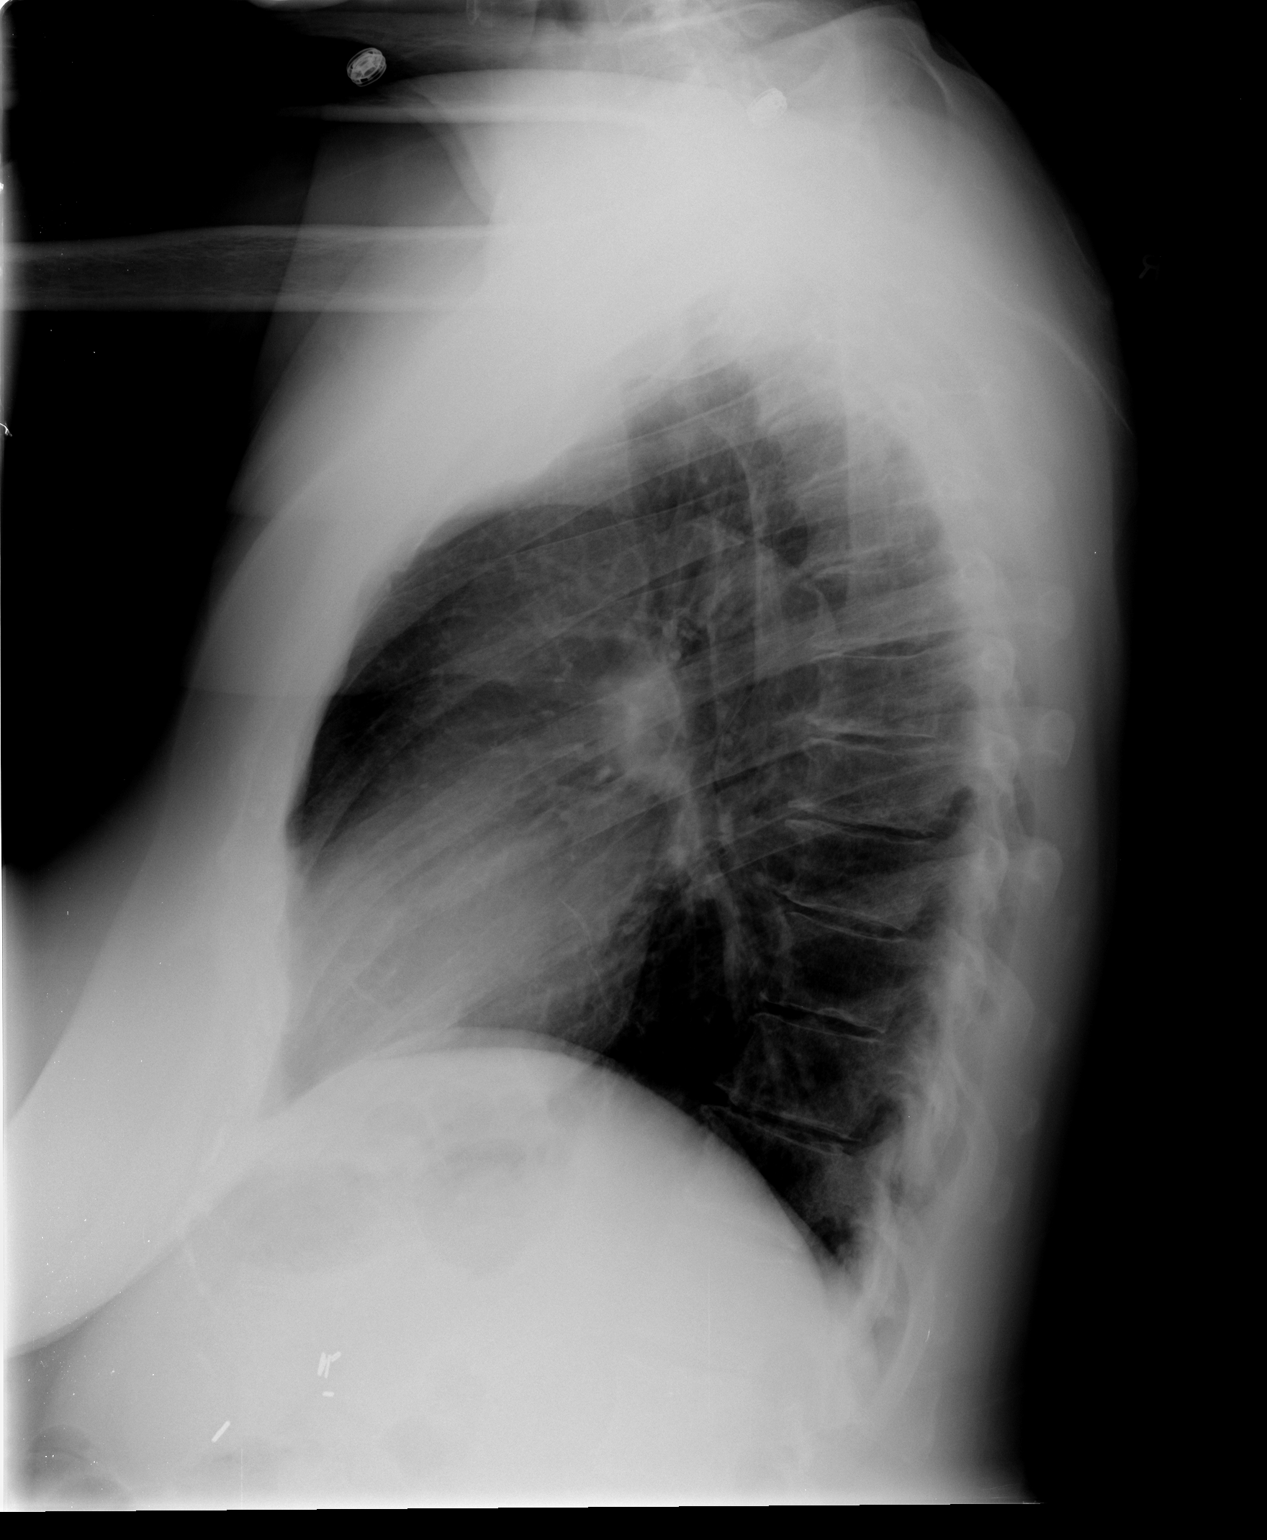

[2 of 2 positions shown; findings below may reference images not displayed]

FINDINGS: The cardiac silhouette is normal size and shape.
Nonaneurysmal aortic calcifications are present. The lungs are well
aerated and free of infiltrates. No pleural abnormality is evident.
Degenerative spondylosis compatible with age. Cholecystectomy
clips.
IMPRESSION: No acute or active cardiopulmonary abnormalities are seen.  Stable
chronic findings are described above.

## 2011-12-09 MED ORDER — SODIUM CHLORIDE 0.9 % IV SOLN: INTRAVENOUS | Status: DC

## 2011-12-09 NOTE — Pre-Procedure Instructions (Signed)
[name]  12/09/2011   Your procedure is scheduled on:12-13-2011 @ 7:30 AM Report to [facility] at 5:30 AM.  Call this number if you have problems the morning of surgery: [phone]    Remember:   Do not eat food:4 Hours before arrival.  May have clear liquids: up to 4 Hours before arrival.  Clear liquids include soda, tea, black coffee, apple or grape juice, broth.Until 1:30 AM  Take these medicines the morning of surgery with A SIP OF WATER Albuterol if needed,atenolol,clonazepam if needed,Pristiq,Flonase if needed,lamictal   Do not wear jewelry, make-up or nail polish.  Do not wear lotions, powders, or perfumes. You may wear deodorant.  Do not shave 48 hours prior to surgery.  Do not bring valuables to the hospital.  Contacts, dentures or bridgework may not be worn into surgery.  Leave suitcase in the car. After surgery it may be brought to your room.  For patients admitted to the hospital, checkout time is 11:00 AM the day of discharge.       Special Instructions: Incentive Spirometry - Practice and bring it with you on the day of surgery. and CHG Shower Use Special Wash: 1/2 bottle night before surgery and 1/2 bottle morning of surgery.   Please read over the following fact sheets that you were given: Pain Booklet, Coughing and Deep Breathing, MRSA Information and Surgical Site Infection Prevention

## 2011-12-09 NOTE — Pre-Procedure Instructions (Signed)
[name]  12/09/2011   Your procedure is scheduled on:12-13-2011 @ 7:30 AM  Report to [facility] at 5:30 AM .  Call this number if you have problems the morning of surgery: [phone]   Remember:   Do not eat food:After Midnight.  May have clear liquids: up to 4 Hours before arrival.  Clear liquids include soda, tea, black coffee, apple or grape juice, broth. Until 1:30 AM  Take these medicines the morning of surgery with A SIP OF WATER:Albuterol if needed, Atenolol,Clonazepam if needed,Pristiq,Flonase if needed,Lamictal   Do not wear jewelry, make-up or nail polish.  Do not wear lotions, powders, or perfumes. You may wear deodorant.  Do not shave 48 hours prior to surgery.  Do not bring valuables to the hospital.  Contacts, dentures or bridgework may not be worn into surgery.  Leave suitcase in the car. After surgery it may be brought to your room.  For patients admitted to the hospital, checkout time is 11:00 AM the day of discharge.      Special Instructions: Incentive Spirometry - Practice and bring it with you on the day of surgery. and CHG Shower Use Special Wash: 1/2 bottle night before surgery and 1/2 bottle morning of surgery.   Please read over the following fact sheets that you were given: Pain Booklet, MRSA Information and Surgical Site Infection Prevention

## 2011-12-12 MED ORDER — CEFAZOLIN SODIUM 1-5 GM-% IV SOLN: 1.0000 g | INTRAVENOUS | Status: DC

## 2011-12-13 ENCOUNTER — Encounter (HOSPITAL_COMMUNITY): Payer: Self-pay | Admitting: Anesthesiology

## 2011-12-13 ENCOUNTER — Ambulatory Visit (HOSPITAL_COMMUNITY): Payer: Medicare Other | Admitting: Anesthesiology

## 2011-12-13 ENCOUNTER — Observation Stay (HOSPITAL_COMMUNITY)
Admission: RE | Admit: 2011-12-13 | Discharge: 2011-12-15 | DRG: 497 | Disposition: A | Discharge status: Home / Self Care | Payer: Medicare Other | Source: Ambulatory Visit | Attending: Orthopedic Surgery | Admitting: Orthopedic Surgery

## 2011-12-13 ENCOUNTER — Encounter (HOSPITAL_COMMUNITY): Payer: Self-pay | Admitting: Orthopedic Surgery

## 2011-12-13 ENCOUNTER — Encounter (HOSPITAL_COMMUNITY): Payer: Self-pay | Admitting: *Deleted

## 2011-12-13 ENCOUNTER — Ambulatory Visit (HOSPITAL_COMMUNITY): Payer: Medicare Other

## 2011-12-13 ENCOUNTER — Encounter (HOSPITAL_COMMUNITY)
Admission: RE | Disposition: A | Discharge status: Home / Self Care | Payer: Self-pay | Source: Ambulatory Visit | Attending: Orthopedic Surgery

## 2011-12-13 DIAGNOSIS — E785 Hyperlipidemia, unspecified: Secondary | ICD-10-CM | POA: Insufficient documentation

## 2011-12-13 DIAGNOSIS — M25511 Pain in right shoulder: Secondary | ICD-10-CM | POA: Diagnosis present

## 2011-12-13 DIAGNOSIS — G8929 Other chronic pain: Secondary | ICD-10-CM | POA: Insufficient documentation

## 2011-12-13 DIAGNOSIS — M67919 Unspecified disorder of synovium and tendon, unspecified shoulder: Secondary | ICD-10-CM | POA: Insufficient documentation

## 2011-12-13 DIAGNOSIS — D1600 Benign neoplasm of scapula and long bones of unspecified upper limb: Secondary | ICD-10-CM | POA: Insufficient documentation

## 2011-12-13 DIAGNOSIS — M19019 Primary osteoarthritis, unspecified shoulder: Principal | ICD-10-CM | POA: Insufficient documentation

## 2011-12-13 DIAGNOSIS — F3289 Other specified depressive episodes: Secondary | ICD-10-CM | POA: Insufficient documentation

## 2011-12-13 DIAGNOSIS — M719 Bursopathy, unspecified: Secondary | ICD-10-CM | POA: Insufficient documentation

## 2011-12-13 DIAGNOSIS — M25512 Pain in left shoulder: Secondary | ICD-10-CM | POA: Diagnosis present

## 2011-12-13 DIAGNOSIS — Z5333 Arthroscopic surgical procedure converted to open procedure: Secondary | ICD-10-CM | POA: Insufficient documentation

## 2011-12-13 DIAGNOSIS — E119 Type 2 diabetes mellitus without complications: Secondary | ICD-10-CM | POA: Insufficient documentation

## 2011-12-13 DIAGNOSIS — IMO0001 Reserved for inherently not codable concepts without codable children: Secondary | ICD-10-CM | POA: Insufficient documentation

## 2011-12-13 DIAGNOSIS — F329 Major depressive disorder, single episode, unspecified: Secondary | ICD-10-CM | POA: Insufficient documentation

## 2011-12-13 DIAGNOSIS — M24119 Other articular cartilage disorders, unspecified shoulder: Secondary | ICD-10-CM | POA: Insufficient documentation

## 2011-12-13 DIAGNOSIS — M25519 Pain in unspecified shoulder: Secondary | ICD-10-CM

## 2011-12-13 DIAGNOSIS — I10 Essential (primary) hypertension: Secondary | ICD-10-CM | POA: Insufficient documentation

## 2011-12-13 HISTORY — PX: SHOULDER ARTHROSCOPY: SHX128

## 2011-12-13 LAB — GLUCOSE, CAPILLARY: Glucose-Capillary: 104 mg/dL — ABNORMAL HIGH (ref 70–99)

## 2011-12-13 IMAGING — CR DG SHOULDER 2+V*L*
1 series · 1 of 1 positions shown · non-contrast
Comparison: [DATE]

CLINICAL DATA: Status post left shoulder surgery

LEFT SHOULDER - 2+ VIEW

[AP]
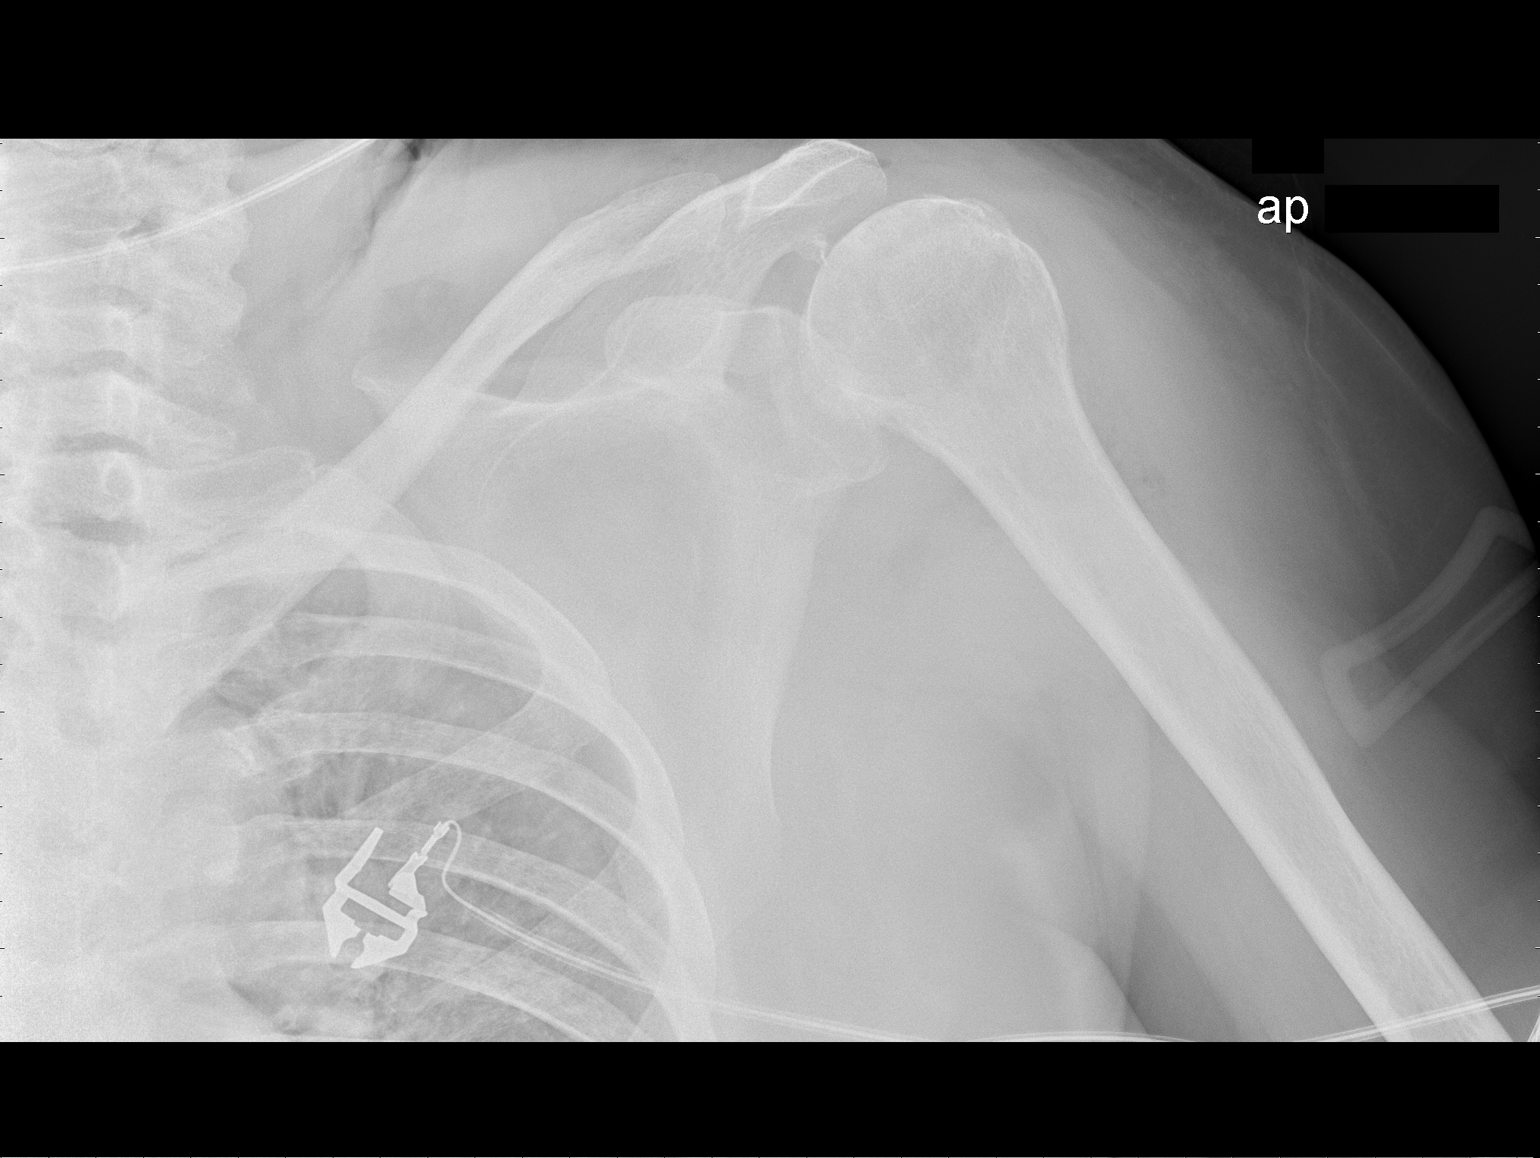

[1 of 1 positions shown; findings below may reference images not displayed]

FINDINGS: Prior left shoulder arthroplasty is identified.

No dislocations.

There is an ossific density within the acromial humeral interval
which measures approximately 6.7 mm.  This appears new from
previous exam and may represent a loose body within the joint
space.
IMPRESSION: 1.  Small ossific density within the acromiohumeral interval.
Cannot rule out loose body

## 2011-12-13 SURGERY — ARTHROSCOPY, SHOULDER
Anesthesia: General | Site: Shoulder | Laterality: Left | Wound class: Clean

## 2011-12-13 MED ORDER — VITAMIN D3 25 MCG (1000 UNIT) PO TABS
2000.0000 [IU] | ORAL_TABLET | Freq: Every day | ORAL | Status: DC
  Administered 2011-12-14 – 2011-12-15 (×2): 2000 [IU] via ORAL
  Filled 2011-12-13 (×3): qty 2

## 2011-12-13 MED ORDER — METHOCARBAMOL 100 MG/ML IJ SOLN
500.0000 mg | INTRAVENOUS | Status: AC
  Administered 2011-12-13: 500 mg via INTRAVENOUS
  Filled 2011-12-13: qty 5

## 2011-12-13 MED ORDER — LACTATED RINGERS IV SOLN
INTRAVENOUS | Status: DC | PRN
  Administered 2011-12-13 (×2): via INTRAVENOUS

## 2011-12-13 MED ORDER — CEFAZOLIN SODIUM-DEXTROSE 2-3 GM-% IV SOLR
INTRAVENOUS | Status: AC
  Administered 2011-12-13: 2 g via INTRAVENOUS
  Filled 2011-12-13: qty 50

## 2011-12-13 MED ORDER — METHOCARBAMOL 500 MG PO TABS
500.0000 mg | ORAL_TABLET | Freq: Four times a day (QID) | ORAL | Status: DC | PRN
  Administered 2011-12-13 – 2011-12-15 (×5): 500 mg via ORAL
  Filled 2011-12-13 (×5): qty 1

## 2011-12-13 MED ORDER — DEXTROSE 5 % IV SOLN: 500.0000 mg | INTRAVENOUS | Status: DC

## 2011-12-13 MED ORDER — METOCLOPRAMIDE HCL 10 MG PO TABS: 5.0000 mg | ORAL_TABLET | Freq: Three times a day (TID) | ORAL | Status: DC | PRN

## 2011-12-13 MED ORDER — MENTHOL 3 MG MT LOZG: 1.0000 | LOZENGE | OROMUCOSAL | Status: DC | PRN

## 2011-12-13 MED ORDER — TRIAMCINOLONE ACETONIDE 40 MG/ML IJ SUSP
40.0000 mg | INTRAMUSCULAR | Status: AC
  Administered 2011-12-13: 40 mg via INTRA_ARTICULAR
  Filled 2011-12-13: qty 1

## 2011-12-13 MED ORDER — PHENYLEPHRINE HCL 10 MG/ML IJ SOLN
10.0000 mg | INTRAVENOUS | Status: DC | PRN
  Administered 2011-12-13: 10 ug/min via INTRAVENOUS

## 2011-12-13 MED ORDER — LIDOCAINE HCL 1 % IJ SOLN
INTRAMUSCULAR | Status: DC | PRN
  Administered 2011-12-13: 10:00:00 via INTRAMUSCULAR

## 2011-12-13 MED ORDER — MORPHINE SULFATE 2 MG/ML IJ SOLN
1.0000 mg | INTRAMUSCULAR | Status: DC | PRN
  Administered 2011-12-13 (×5): 2 mg via INTRAVENOUS

## 2011-12-13 MED ORDER — OXYCODONE-ACETAMINOPHEN 5-325 MG PO TABS
1.0000 | ORAL_TABLET | ORAL | Status: DC | PRN
  Administered 2011-12-13 – 2011-12-15 (×5): 2 via ORAL
  Filled 2011-12-13 (×7): qty 2

## 2011-12-13 MED ORDER — VITAMIN D 50 MCG (2000 UT) PO TABS: 2000.0000 [IU] | ORAL_TABLET | Freq: Every day | ORAL | Status: DC

## 2011-12-13 MED ORDER — CEFAZOLIN SODIUM-DEXTROSE 2-3 GM-% IV SOLR: 2.0000 g | INTRAVENOUS | Status: DC

## 2011-12-13 MED ORDER — PROPOFOL 10 MG/ML IV EMUL
INTRAVENOUS | Status: DC | PRN
  Administered 2011-12-13: 200 mg via INTRAVENOUS

## 2011-12-13 MED ORDER — HYDROMORPHONE HCL PF 1 MG/ML IJ SOLN
0.5000 mg | INTRAMUSCULAR | Status: DC | PRN
  Administered 2011-12-13: 1 mg via INTRAVENOUS
  Administered 2011-12-13: 0.5 mg via INTRAVENOUS
  Administered 2011-12-13 – 2011-12-14 (×3): 1 mg via INTRAVENOUS
  Filled 2011-12-13 (×4): qty 1

## 2011-12-13 MED ORDER — METHOCARBAMOL 100 MG/ML IJ SOLN
500.0000 mg | Freq: Four times a day (QID) | INTRAVENOUS | Status: DC | PRN
  Filled 2011-12-13: qty 5

## 2011-12-13 MED ORDER — ALBUTEROL SULFATE HFA 108 (90 BASE) MCG/ACT IN AERS
2.0000 | INHALATION_SPRAY | RESPIRATORY_TRACT | Status: DC | PRN
  Filled 2011-12-13: qty 6.7

## 2011-12-13 MED ORDER — TRAZODONE HCL 100 MG PO TABS
100.0000 mg | ORAL_TABLET | Freq: Every day | ORAL | Status: DC
  Administered 2011-12-13 – 2011-12-14 (×2): 100 mg via ORAL
  Filled 2011-12-13 (×3): qty 1

## 2011-12-13 MED ORDER — ONDANSETRON HCL 4 MG PO TABS: 4.0000 mg | ORAL_TABLET | Freq: Four times a day (QID) | ORAL | Status: DC | PRN

## 2011-12-13 MED ORDER — CLONAZEPAM 0.5 MG PO TABS
0.2500 mg | ORAL_TABLET | Freq: Two times a day (BID) | ORAL | Status: DC | PRN
Start: 2011-12-13 — End: 2011-12-15

## 2011-12-13 MED ORDER — CHLORHEXIDINE GLUCONATE 4 % EX LIQD
60.0000 mL | Freq: Once | CUTANEOUS | Status: DC
Start: 2011-12-13 — End: 2011-12-13

## 2011-12-13 MED ORDER — POTASSIUM CHLORIDE IN NACL 20-0.9 MEQ/L-% IV SOLN
INTRAVENOUS | Status: DC
  Administered 2011-12-13: 15:00:00 via INTRAVENOUS
  Filled 2011-12-13 (×2): qty 1000

## 2011-12-13 MED ORDER — ASPIRIN 81 MG PO CHEW
81.0000 mg | CHEWABLE_TABLET | Freq: Every day | ORAL | Status: DC
  Administered 2011-12-14 – 2011-12-15 (×2): 81 mg via ORAL
  Filled 2011-12-13 (×2): qty 1

## 2011-12-13 MED ORDER — MEPERIDINE HCL 25 MG/ML IJ SOLN: 6.2500 mg | INTRAMUSCULAR | Status: DC | PRN

## 2011-12-13 MED ORDER — ACETAMINOPHEN 650 MG RE SUPP: 650.0000 mg | Freq: Four times a day (QID) | RECTAL | Status: DC | PRN

## 2011-12-13 MED ORDER — KETOROLAC TROMETHAMINE 30 MG/ML IJ SOLN
INTRAMUSCULAR | Status: DC | PRN
  Administered 2011-12-13: 30 mg via INTRAVENOUS

## 2011-12-13 MED ORDER — METOCLOPRAMIDE HCL 5 MG/ML IJ SOLN
5.0000 mg | Freq: Three times a day (TID) | INTRAMUSCULAR | Status: DC | PRN
  Filled 2011-12-13: qty 2

## 2011-12-13 MED ORDER — ONDANSETRON HCL 4 MG/2ML IJ SOLN: 4.0000 mg | Freq: Four times a day (QID) | INTRAMUSCULAR | Status: DC | PRN

## 2011-12-13 MED ORDER — LAMOTRIGINE 150 MG PO TABS
150.0000 mg | ORAL_TABLET | Freq: Every day | ORAL | Status: DC
  Administered 2011-12-13 – 2011-12-15 (×3): 150 mg via ORAL
  Filled 2011-12-13 (×3): qty 1

## 2011-12-13 MED ORDER — ASPIRIN 81 MG PO TABS: 81.0000 mg | ORAL_TABLET | Freq: Every day | ORAL | Status: DC

## 2011-12-13 MED ORDER — BUPIVACAINE-EPINEPHRINE PF 0.5-1:200000 % IJ SOLN
INTRAMUSCULAR | Status: DC | PRN
  Administered 2011-12-13: 20 mL

## 2011-12-13 MED ORDER — MIDAZOLAM HCL 5 MG/5ML IJ SOLN
INTRAMUSCULAR | Status: DC | PRN
  Administered 2011-12-13: 2 mg via INTRAVENOUS

## 2011-12-13 MED ORDER — FENTANYL CITRATE 0.05 MG/ML IJ SOLN
INTRAMUSCULAR | Status: DC | PRN
  Administered 2011-12-13 (×4): 50 ug via INTRAVENOUS

## 2011-12-13 MED ORDER — ACETAMINOPHEN 325 MG PO TABS: 650.0000 mg | ORAL_TABLET | Freq: Four times a day (QID) | ORAL | Status: DC | PRN

## 2011-12-13 MED ORDER — LACTATED RINGERS IV SOLN: INTRAVENOUS | Status: DC

## 2011-12-13 MED ORDER — PHENOL 1.4 % MT LIQD: 1.0000 | OROMUCOSAL | Status: DC | PRN

## 2011-12-13 MED ORDER — ONDANSETRON HCL 4 MG/2ML IJ SOLN
INTRAMUSCULAR | Status: DC | PRN
  Administered 2011-12-13: 4 mg via INTRAVENOUS

## 2011-12-13 MED ORDER — ATENOLOL 25 MG PO TABS
25.0000 mg | ORAL_TABLET | Freq: Every day | ORAL | Status: DC
  Administered 2011-12-14 – 2011-12-15 (×2): 25 mg via ORAL
  Filled 2011-12-13 (×3): qty 1

## 2011-12-13 MED ORDER — CEFAZOLIN SODIUM 1-5 GM-% IV SOLN
1.0000 g | Freq: Four times a day (QID) | INTRAVENOUS | Status: AC
  Administered 2011-12-13 (×2): 1 g via INTRAVENOUS
  Filled 2011-12-13 (×2): qty 50

## 2011-12-13 MED ORDER — FLUTICASONE PROPIONATE 50 MCG/ACT NA SUSP
2.0000 | Freq: Every day | NASAL | Status: DC | PRN
  Filled 2011-12-13: qty 16

## 2011-12-13 MED ORDER — PROMETHAZINE HCL 25 MG/ML IJ SOLN: 6.2500 mg | INTRAMUSCULAR | Status: DC | PRN

## 2011-12-13 MED ORDER — DESVENLAFAXINE SUCCINATE ER 50 MG PO TB24
50.0000 mg | ORAL_TABLET | Freq: Every day | ORAL | Status: DC
Start: 2011-12-13 — End: 2011-12-15
  Administered 2011-12-13 – 2011-12-15 (×3): 50 mg via ORAL
  Filled 2011-12-13 (×3): qty 1

## 2011-12-13 MED ORDER — HYDROMORPHONE HCL PF 1 MG/ML IJ SOLN
INTRAMUSCULAR | Status: AC
  Filled 2011-12-13: qty 1

## 2011-12-13 MED ORDER — BUPIVACAINE-EPINEPHRINE 0.25% -1:200000 IJ SOLN
INTRAMUSCULAR | Status: DC | PRN
  Administered 2011-12-13: 4 mL

## 2011-12-13 MED ORDER — EPHEDRINE SULFATE 50 MG/ML IJ SOLN
INTRAMUSCULAR | Status: DC | PRN
  Administered 2011-12-13 (×4): 10 mg via INTRAVENOUS

## 2011-12-13 MED ORDER — MORPHINE SULFATE 4 MG/ML IJ SOLN: INTRAMUSCULAR | Status: DC | PRN

## 2011-12-13 MED ORDER — SODIUM CHLORIDE 0.9 % IR SOLN
Status: DC | PRN
  Administered 2011-12-13: 3000 mL

## 2011-12-13 MED ORDER — ROCURONIUM BROMIDE 100 MG/10ML IV SOLN
INTRAVENOUS | Status: DC | PRN
  Administered 2011-12-13: 30 mg via INTRAVENOUS

## 2011-12-13 SURGICAL SUPPLY — 50 items
ANCHOR JUGGERKNOT SZ1 (Anchor) ×2 IMPLANT
BLADE CUDA 4.2 (BLADE) IMPLANT
BLADE SURG 11 STRL SS (BLADE) ×2 IMPLANT
BUR OVAL 4.0 (BURR) IMPLANT
CLOSURE STERI STRIP 1/2 X4 (GAUZE/BANDAGES/DRESSINGS) ×2 IMPLANT
CLOTH BEACON ORANGE TIMEOUT ST (SAFETY) ×2 IMPLANT
COVER SURGICAL LIGHT HANDLE (MISCELLANEOUS) ×2 IMPLANT
DRAPE INCISE IOBAN 66X45 STRL (DRAPES) ×2 IMPLANT
DRAPE STERI 35X30 U-POUCH (DRAPES) ×2 IMPLANT
DRAPE U-SHAPE 47X51 STRL (DRAPES) ×2 IMPLANT
DRSG ADAPTIC 3X8 NADH LF (GAUZE/BANDAGES/DRESSINGS) ×2 IMPLANT
DRSG EMULSION OIL 3X3 NADH (GAUZE/BANDAGES/DRESSINGS) ×4 IMPLANT
DRSG PAD ABDOMINAL 8X10 ST (GAUZE/BANDAGES/DRESSINGS) ×2 IMPLANT
DURAPREP 26ML APPLICATOR (WOUND CARE) ×2 IMPLANT
ELECT BLADE 4.0 EZ CLEAN MEGAD (MISCELLANEOUS) ×2
ELECT NEEDLE TIP 2.8 STRL (NEEDLE) ×2 IMPLANT
ELECT REM PT RETURN 9FT ADLT (ELECTROSURGICAL)
ELECTRODE BLDE 4.0 EZ CLN MEGD (MISCELLANEOUS) ×1 IMPLANT
ELECTRODE REM PT RTRN 9FT ADLT (ELECTROSURGICAL) IMPLANT
GLOVE BIOGEL PI ORTHO PRO 7.5 (GLOVE) ×1
GLOVE BIOGEL PI ORTHO PRO SZ8 (GLOVE) ×1
GLOVE ORTHO TXT STRL SZ7.5 (GLOVE) ×2 IMPLANT
GLOVE PI ORTHO PRO STRL 7.5 (GLOVE) ×1 IMPLANT
GLOVE PI ORTHO PRO STRL SZ8 (GLOVE) ×1 IMPLANT
GLOVE SURG ORTHO 8.5 STRL (GLOVE) ×2 IMPLANT
GOWN STRL NON-REIN LRG LVL3 (GOWN DISPOSABLE) ×4 IMPLANT
KIT BASIN OR (CUSTOM PROCEDURE TRAY) ×2 IMPLANT
KIT ROOM TURNOVER OR (KITS) ×2 IMPLANT
MANIFOLD NEPTUNE II (INSTRUMENTS) ×2 IMPLANT
NEEDLE HYPO 25GX1X1/2 BEV (NEEDLE) ×2 IMPLANT
NEEDLE SPNL 18GX3.5 QUINCKE PK (NEEDLE) ×2 IMPLANT
NS IRRIG 1000ML POUR BTL (IV SOLUTION) ×2 IMPLANT
PACK SHOULDER (CUSTOM PROCEDURE TRAY) ×2 IMPLANT
PAD ARMBOARD 7.5X6 YLW CONV (MISCELLANEOUS) ×4 IMPLANT
RESECTOR FULL RADIUS 4.2MM (BLADE) ×2 IMPLANT
SET ARTHROSCOPY TUBING (MISCELLANEOUS) ×1
SET ARTHROSCOPY TUBING LN (MISCELLANEOUS) ×1 IMPLANT
SET JUGGERKNOT DISP 1.4MM ×2 IMPLANT
SLING ARM FOAM STRAP LRG (SOFTGOODS) ×2 IMPLANT
SLING ARM FOAM STRAP MED (SOFTGOODS) ×2 IMPLANT
SPONGE GAUZE 4X4 12PLY (GAUZE/BANDAGES/DRESSINGS) ×2 IMPLANT
STRIP CLOSURE SKIN 1/2X4 (GAUZE/BANDAGES/DRESSINGS) ×2 IMPLANT
SUT MNCRL AB 3-0 PS2 27 (SUTURE) ×2 IMPLANT
SYR CONTROL 10ML LL (SYRINGE) ×2 IMPLANT
TAPE CLOTH SURG 4X10 WHT LF (GAUZE/BANDAGES/DRESSINGS) ×2 IMPLANT
TOWEL OR 17X24 6PK STRL BLUE (TOWEL DISPOSABLE) ×2 IMPLANT
TOWEL OR 17X26 10 PK STRL BLUE (TOWEL DISPOSABLE) ×2 IMPLANT
TUBE CONNECTING 12X1/4 (SUCTIONS) ×2 IMPLANT
WAND 90 DEG TURBOVAC W/CORD (SURGICAL WAND) ×2 IMPLANT
WATER STERILE IRR 1000ML POUR (IV SOLUTION) ×2 IMPLANT

## 2011-12-13 NOTE — Anesthesia Procedure Notes (Addendum)
Anesthesia Regional Block:  Interscalene brachial plexus block  Pre-Anesthetic Checklist: ,, timeout performed, Correct Patient, Correct Site, Correct Laterality, Correct Procedure, Correct Position, site marked, Risks and benefits discussed, at surgeon's request and post-op pain management  Laterality: Upper and Left  Prep: Betadine, chloraprep and alcohol swabs       Needles:  Injection technique: Single-shot  Needle Type: Stimulator Needle - 40      Needle Gauge: 22 and 22 G    Additional Needles:  Procedures: nerve stimulator Interscalene brachial plexus block  Nerve Stimulator or Paresthesia:  Response: Twitch elicited, 0.5 mA, 0.3 ms,   Additional Responses:   Narrative:  Start time: 12/13/2011 7:10 AM End time: 12/13/2011 7:15 AM  Performed by: Personally  Anesthesiologist: Alma Friendly, MD  Additional Notes: Block assessed prior to start of surgery  Interscalene brachial plexus block Procedure Name: Intubation Date/Time: 12/13/2011 7:45 AM Performed by: Caryn Bee Pre-anesthesia Checklist: Patient identified, Timeout performed, Emergency Drugs available, Suction available and Patient being monitored Patient Re-evaluated:Patient Re-evaluated prior to inductionOxygen Delivery Method: Circle System Utilized Preoxygenation: Pre-oxygenation with 100% oxygen Intubation Type: IV induction Ventilation: Mask ventilation without difficulty Laryngoscope Size: Mac and 3 Grade View: Grade II Tube type: Oral Tube size: 7.5 mm Airway Equipment and Method: stylet Placement Confirmation: ETT inserted through vocal cords under direct vision,  positive ETCO2 and breath sounds checked- equal and bilateral Secured at: 21 cm Tube secured with: Tape Dental Injury: Injury to lip

## 2011-12-13 NOTE — Preoperative (Signed)
Beta Blockers   Reason not to administer Beta Blockers:Not Applicable 

## 2011-12-13 NOTE — Anesthesia Preprocedure Evaluation (Addendum)
Anesthesia Evaluation  Patient identified by MRN, date of birth, ID band Patient awake    Reviewed: Allergy & Precautions, H&P , NPO status , Patient's Chart, lab work & pertinent test results  Airway Mallampati: III  Neck ROM: Full  Mouth opening: Limited Mouth Opening  Dental  (+) Teeth Intact   Pulmonary  clear to auscultation        Cardiovascular Regular Normal    Neuro/Psych    GI/Hepatic   Endo/Other    Renal/GU      Musculoskeletal  (+) Arthritis -,   Abdominal   Peds  Hematology   Anesthesia Other Findings   Reproductive/Obstetrics                         Anesthesia Physical Anesthesia Plan  ASA: II  Anesthesia Plan: General   Post-op Pain Management:    Induction: Intravenous  Airway Management Planned: Oral ETT  Additional Equipment:   Intra-op Plan:   Post-operative Plan: Extubation in OR  Informed Consent: I have reviewed the patients History and Physical, chart, labs and discussed the procedure including the risks, benefits and alternatives for the proposed anesthesia with the patient or authorized representative who has indicated his/her understanding and acceptance.   Dental advisory given  Plan Discussed with: CRNA and Surgeon  Anesthesia Plan Comments:         Anesthesia Quick Evaluation

## 2011-12-13 NOTE — Interval H&P Note (Signed)
History and Physical Interval Note:  12/13/2011 7:35 AM  Alexandra Wolfe  has presented today for surgery, with the diagnosis of left Shoulder OA Bicep tear Flap Lesion Right Pain  The various methods of treatment have been discussed with the patient and family. After consideration of risks, benefits and other options for treatment, the patient has consented to  Procedure(s): ARTHROSCOPY SHOULDER as a surgical intervention .  The patients' history has been reviewed, patient examined, no change in status, stable for surgery.  I have reviewed the patients' chart and labs.  Questions were answered to the patient's satisfaction.     Kenzel Ruesch,STEVEN R

## 2011-12-13 NOTE — Anesthesia Postprocedure Evaluation (Signed)
  Anesthesia Post-op Note  Patient: Alexandra Wolfe  Procedure(s) Performed:  ARTHROSCOPY SHOULDER - Left Shoulder ArthroscopyDebridement Limited Tenodesis Open Rotator Cuff Repair Spur Removal Right Shoulder Injection   Patient Location: PACU  Anesthesia Type: General and GA combined with regional for post-op pain  Level of Consciousness: awake  Airway and Oxygen Therapy: Patient Spontanous Breathing  Post-op Pain: none  Post-op Assessment: Post-op Vital signs reviewed  Post-op Vital Signs: stable  Complications: No apparent anesthesia complications

## 2011-12-13 NOTE — Transfer of Care (Signed)
Immediate Anesthesia Transfer of Care Note  Patient: Alexandra Wolfe  Procedure(s) Performed:  ARTHROSCOPY SHOULDER - Left Shoulder ArthroscopyDebridement Limited Tenodesis Open Rotator Cuff Repair Spur Removal Right Shoulder Injection   Patient Location: PACU  Anesthesia Type: General  Level of Consciousness: awake, alert  and oriented  Airway & Oxygen Therapy: Patient Spontanous Breathing and Patient connected to nasal cannula oxygen  Post-op Assessment: Report given to PACU RN and Post -op Vital signs reviewed and stable  Post vital signs: Reviewed and stable  Complications: No apparent anesthesia complications

## 2011-12-13 NOTE — Op Note (Signed)
Alexandra Wolfe, Alexandra Wolfe NO.:  [id number]  MEDICAL RECORD NO.:  [id number]  LOCATION:  5028                         FACILITY:  [facility]  PHYSICIAN:  Almedia Balls. Ranell Patrick, M.D. DATE OF BIRTH:  Mar 17, 1946  DATE OF PROCEDURE:  12/13/2011 DATE OF DISCHARGE:                              OPERATIVE REPORT   PREOPERATIVE DIAGNOSES: 1. Left shoulder osteoarthritis, biceps tear, SLAP lesion, and right     shoulder pain. 2. Right shoulder pain.  POSTOPERATIVE DIAGNOSES: 1. Left shoulder osteoarthritis, biceps tear, SLAP lesion, and right     shoulder pain. 2. Right shoulder pain.  PROCEDURE PERFORMED: 1. Right shoulder intra-articular corticosteroid injection with 40 mg     Kenalog. 2. Left shoulder arthroscopy with extensive intra-articular     debridement including removal of large osteophyte, debridement of     SLAP lesion, biceps tenotomy and partial synovectomy followed by     arthroscopic subacromial bursectomy and then open removal of a     large humeral spur.  ATTENDING SURGEON:  Almedia Balls. Ranell Patrick, M.D.  ASSISTANT:  Modesto Charon, P-AC.  ANESTHESIA:  General, plus interscalene block.  ESTIMATED BLOOD LOSS:  Less than 50 mL.  FLUID REPLACEMENT:  1200 mL crystalloid.  INSTRUMENT COUNTS:  Correct.  COMPLICATIONS:  None.  Perioperative antibiotics were given.  INDICATIONS:  The patient is a 66 year old female with worsening left shoulder pain and declining function and range of motion secondary to a significant shoulder arthritis as well as advanced labral degeneration and tearing, and a large inferior humeral spur.  The patient was counseled regarding options for treatment including conservative management, which she had done and failed.  Arthroscopy with spur removal versus total shoulder arthroplasty.  Given the patient's high physical demand level and young age of 55, we elected to proceed with shoulder arthroscopy and debridement and spur excision to  try to improve range of motion and decrease her pain, with an intact rotator cuff, I think this was a viable option.  Informed consent was obtained.  PROCEDURE:  After an adequate level of anesthesia was achieved, the patient was positioned in the modified beach-chair position.  Left shoulder correctly identified and sterilely prepped in the usual manner. We entered the shoulder using standard arthroscopic portals including posterior and anterior lateral portals.  Upon entering into the shoulder, we had done a time-out prior to this.  Upon entering the shoulder joint, we identified severe synovitis.  There was a large extensive superior labral tear with an unstable biceps anchor. Performed a biceps tenotomy and labral debrided back to stable labral rim using basket forceps and a motorized shaver.  Subscap normal. Rotator cuff was noted to be normal.  Posteriorly, there was a large osteophyte off the back of the glenoid, which we removed using an Museum/gallery curator and then a Company secretary.  Once we removed that, we debrided the posterior labrum down to a stable labral tissue.  There was full-thickness cartilage loss in the glenohumeral joint on the areas.  It was not all the way, but definitely posteriorly.  There was more wear.  We then went ahead and concluded the intra-articular portion of surgery and placed a  scope in subacromial space and performed a thorough bursectomy and inspected the rotator cuff, which was intact and appeared to be robust.  There was no significant subacromial impingement occurring from the acromion itself, so we did do an acromioplasty for fear of creating an escape situation with CA ligament insufficiency.  At this point, we went ahead and stopped the arthroscopic part of the surgery upon verification of intact cuff and removal of the osteophyte and a debridement of the labrum and biceps tenotomy.  We then performed an open incision  starting at the coracoid process extending down the anterior humerus.  With a deltopectoral approach, dissection down to subcu tissues using Bovie. Identified the deltopectoral interval at the coracoid extended down distally, placed the retractor under the conjoined tendon and then of the deltoid exposing subscap.  We then did a subscap release off the lesser tuberosity.  We tenodesed the biceps tendon, mid tension with a soft tissue tenodesis with 0 Vicryl suture, mid tension elbow at 90 degrees.  We then went ahead and did a partial subscap takedown leaving most of the rotator interval intact and doing just down the lateral side of the lesser tuberosity and then down inferiorly along the inferior humeral neck.  We identified a large inferior osteophyte and removed that using osteotome and mallet.  Controlled bleeding with electrocautery.  Once that was removed, I was able to repair the subscap anatomically.  We used a single Actuary through the inferior part of lesser tuberosity more superiorly were able to pass the needle through bone and also of the over-sew up at the rotator interval. We had nice anatomic repair.  Range of motion was significantly improved.  We could do forward elevator up to about 160 degrees now whereas before it was about 110, 120, and external rotation improved from about 10 to 15 degrees up about 45, and the motion seemed a lot more smooth, less crepitus.  At this point, we thoroughly irrigated. The deltopectoral interval was closed with 0 Vicryl suture followed by 2- 0 Vicryl subcutaneous closure and 4-0 Monocryl for skin.  Steri-Strips applied, followed by a sterile dressing.  We then injected the right shoulder intra-articularly with 1 mL of Kenalog 40 mg and 0.25% lidocaine and Marcaine.  She tolerated that well and we waken her and took her to the recovery room in stable condition.     Almedia Balls. Ranell Patrick, M.D.     SRN/MEDQ  D:   12/13/2011  T:  12/13/2011  Job:  [id number]

## 2011-12-13 NOTE — Progress Notes (Signed)
Utilization review completed. Esmeralda Blanford, RN, BSN. 12/13/11  

## 2011-12-13 NOTE — Progress Notes (Signed)
Report given to phillip rn as caregiver 

## 2011-12-13 NOTE — Brief Op Note (Signed)
12/13/2011  10:14 AM  PATIENT:  Alexandra Wolfe  66 y.o. female  PRE-OPERATIVE DIAGNOSIS:  left Shoulder osteoarthritis Bicep tear, SLAP Lesion. Right Shoulder Pain  POST-OPERATIVE DIAGNOSIS:  left Shoulder osteoarthritis, large bone spur, SLAP lesion, Bicep tear. Right Shoulder Pain  PROCEDURE:  Procedure(s): ARTHROSCOPY SHOULDER, EXTENSIVE DEBRIDEMENT OF SLAP LESION, BICEPS TENOTOMY< LARGE SPUR REMOVAL, BICEPS TENODESIS,   SURGEON:  Surgeon(s): Verlee Rossetti  PHYSICIAN ASSISTANT:   ASSISTANTS: Thea Gist, PA-C   ANESTHESIA:   regional, general  EBL:  Total I/O In: 1000 [I.V.:1000] Out: 25 [Blood:25]  BLOOD ADMINISTERED:none  DRAINS: none   LOCAL MEDICATIONS USED:  MARCAINE 10 CC  SPECIMEN:  No Specimen  DISPOSITION OF SPECIMEN:  none  COUNTS:  YES  TOURNIQUET:  * No tourniquets in log *  DICTATION: .Other Dictation: Dictation Number [id number]  PLAN OF CARE: Admit to inpatient   PATIENT DISPOSITION:  PACU - hemodynamically stable.   Delay start of Pharmacological VTE agent (>24hrs) due to surgical blood loss or risk of bleeding:  {YES/NO/NOT APPLICABLE:20182

## 2011-12-14 LAB — BASIC METABOLIC PANEL
BUN: 14 mg/dL (ref 6–23)
Calcium: 8.7 mg/dL (ref 8.4–10.5)
Creatinine, Ser: 0.96 mg/dL (ref 0.50–1.10)
GFR calc Af Amer: 70 mL/min — ABNORMAL LOW (ref >90)
GFR calc non Af Amer: 60 mL/min — ABNORMAL LOW (ref >90)
Glucose, Bld: 115 mg/dL — ABNORMAL HIGH (ref 70–99)
Potassium: 5.6 mEq/L — ABNORMAL HIGH (ref 3.5–5.1)

## 2011-12-14 LAB — GLUCOSE, CAPILLARY
Glucose-Capillary: 177 mg/dL — ABNORMAL HIGH (ref 70–99)
Glucose-Capillary: 116 mg/dL — ABNORMAL HIGH (ref 70–99)

## 2011-12-14 LAB — HEMOGLOBIN AND HEMATOCRIT, BLOOD: Hemoglobin: 12.6 g/dL (ref 12.0–15.0)

## 2011-12-14 NOTE — Discharge Summary (Signed)
Physician Discharge Summary  Patient ID: Alexandra Wolfe MRN: [id number] DOB/AGE: 02/20/1946 66 y.o.  Admit date: 12/13/2011 Discharge date: 12/14/2011  Admission Diagnoses:  Principal Problem:  *Shoulder pain   Discharge Diagnoses:  Same   Surgeries: Procedure(s): ARTHROSCOPY SHOULDER, OPEN TENODESIS AND DEBRIDEMENT on 12/13/2011   Consultants: OT, Care Mgt  Discharged Condition: Stable  Hospital Course: Alexandra Wolfe is an 66 y.o. female who was admitted 12/13/2011 with a chief complaint of shoulder pain, OA, and found to have a diagnosis of Shoulder arthritis.  They were brought to the operating room on 12/13/2011 and underwent the above named procedures.    The patient had an uncomplicated hospital course and was stable for discharge.  Recent vital signs:  Filed Vitals:   12/14/11 0524  BP: 134/68  Pulse: 95  Temp: 98.2 F (36.8 C)  Resp: 16    Recent laboratory studies:  Results for orders placed during the hospital encounter of 12/13/11  GLUCOSE, CAPILLARY      Component Value Range   Glucose-Capillary 104 (*) 70 - 99 (mg/dL)  GLUCOSE, CAPILLARY      Component Value Range   Glucose-Capillary 119 (*) 70 - 99 (mg/dL)  GLUCOSE, CAPILLARY      Component Value Range   Glucose-Capillary 118 (*) 70 - 99 (mg/dL)   Comment 1 Documented in Chart     Comment 2 Notify RN    GLUCOSE, CAPILLARY      Component Value Range   Glucose-Capillary 111 (*) 70 - 99 (mg/dL)   Comment 1 Documented in Chart     Comment 2 Notify RN    HEMOGLOBIN AND HEMATOCRIT, BLOOD      Component Value Range   Hemoglobin 12.6  12.0 - 15.0 (g/dL)   HCT 40.9  81.1 - 91.4 (%)  GLUCOSE, CAPILLARY      Component Value Range   Glucose-Capillary 92  70 - 99 (mg/dL)   Comment 1 Notify RN    GLUCOSE, CAPILLARY      Component Value Range   Glucose-Capillary 177 (*) 70 - 99 (mg/dL)   Comment 1 Notify RN      Discharge Medications:   Current Discharge Medication List    CONTINUE these medications  which have NOT CHANGED   Details  albuterol (PROAIR HFA) 108 (90 BASE) MCG/ACT inhaler Inhale 2 puffs into the lungs every 4 (four) hours as needed. For shortness of breath    aspirin 81 MG tablet Take 81 mg by mouth daily.      atenolol (TENORMIN) 25 MG tablet TAKE ONE TABLET BY MOUTH EVERY DAY Qty: 90 tablet, Refills: 1    atorvastatin (LIPITOR) 10 MG tablet Take 1 tablet (10 mg total) by mouth daily. Qty: 90 tablet, Refills: 3    Cholecalciferol (VITAMIN D) 2000 UNITS tablet Take 2,000 Units by mouth daily.      clonazePAM (KLONOPIN) 1 MG tablet 1/2-1 by mouth three times a day as needed     desvenlafaxine (PRISTIQ) 100 MG 24 hr tablet Take 50 mg by mouth daily.     fluticasone (FLONASE) 50 MCG/ACT nasal spray Place 2 sprays into the nose daily as needed. For allergies    lamoTRIgine (LAMICTAL) 150 MG tablet Take 150 mg by mouth daily.      traZODone (DESYREL) 100 MG tablet Take 100 mg by mouth at bedtime. At bedtime        Diagnostic Studies: Dg Chest 2 View  12/09/2011  *RADIOLOGY REPORT*  Clinical Data: Preoperative  evaluation.  Diabetes.  Former smoker.  CHEST - 2 VIEW  Comparison: 05/08/2010.  Findings: The cardiac silhouette is normal size and shape. Nonaneurysmal aortic calcifications are present. The lungs are well aerated and free of infiltrates. No pleural abnormality is evident. Degenerative spondylosis compatible with age. Cholecystectomy clips.  IMPRESSION: No acute or active cardiopulmonary abnormalities are seen.  Stable chronic findings are described above.  Original Report Authenticated By: Crawford Givens, M.D.   Dg Shoulder Left  12/13/2011  *RADIOLOGY REPORT*  Clinical Data: Status post left shoulder surgery  LEFT SHOULDER - 2+ VIEW  Comparison: 03/30/2009  Findings: Prior left shoulder arthroplasty is identified.  No dislocations.  There is an ossific density within the acromial humeral interval which measures approximately 6.7 mm.  This appears new from previous  exam and may represent a loose body within the joint space.  IMPRESSION:  1.  Small ossific density within the acromiohumeral interval. Cannot rule out loose body  Original Report Authenticated By: Rosealee Albee, M.D.    Disposition: Home   Discharge Orders    Future Appointments: Provider: Department: Dept Phone: Center:   02/14/2012 3:30 PM Oliver Barre, MD [facility] [phone] [facility]      Follow-up Information    Follow up with Feleica Fulmore,STEVEN R. Call in 2 weeks.   Contact information:   [facility] [address] [place] [address] [phone]         Patient doing very well s/p shoulder scope and open debridement.  Recommend HH PT, OT.   Ice constantly  F/u 2 weeks   Signed: Zimir Kittleson,STEVEN R 12/14/2011, 8:44 AM

## 2011-12-14 NOTE — Progress Notes (Signed)
OT Note OT shoulder evaluation completed and filed in ghost chart. Will follow acutely to continue education.  Pt unable to tolerate OOB activity this AM but encouraged to sit up in recliner with assist from nursing staff.  Recommend HHOT.  No DME needs at this time.    12/14/2011 Cipriano Mile OTR/L Pager [phone] Office [phone]

## 2011-12-14 NOTE — Progress Notes (Signed)
Orthopedics Progress Note  Subjective: C/O pain this AM in the shoulder.  Has not been OOB. Tolerating po well  Objective:  Filed Vitals:   12/14/11 0524  BP: 134/68  Pulse: 95  Temp: 98.2 F (36.8 C)  Resp: 16    General: Awake and alert  Musculoskeletal: Shoulder dressing CDI, NVI in the left arm Neurovascularly intact  Lab Results  Component Value Date   WBC 12.5* 12/09/2011   HGB 12.6 12/14/2011   HCT 38.5 12/14/2011   MCV 90.0 12/09/2011   PLT 337 12/09/2011       Component Value Date/Time   NA 139 12/09/2011 1129   K 5.1 12/09/2011 1129   CL 102 12/09/2011 1129   CO2 27 12/09/2011 1129   GLUCOSE 100* 12/09/2011 1129   BUN 15 12/09/2011 1129   CREATININE 0.92 12/09/2011 1129   CALCIUM 10.2 12/09/2011 1129   GFRNONAA 63* 12/09/2011 1129   GFRAA 74* 12/09/2011 1129    Lab Results  Component Value Date   INR 1.0 ratio 11/13/2009    Assessment/Plan: POD #1 s/p Procedure(s): ARTHROSCOPY SHOULDER, OPEN biceps tenodesis and removal of large spurs Doing well.  Still in a lot of pain.  Will need to stay until tomorrow for pain control. Will need HH PT/OT on D/C. Plan D/C Sunday  Almedia Balls. Ranell Patrick, MD 12/14/2011 8:37 AM

## 2011-12-15 MED ORDER — METHOCARBAMOL 500 MG PO TABS: 500.0000 mg | ORAL_TABLET | Freq: Four times a day (QID) | ORAL | Status: AC | PRN

## 2011-12-15 MED ORDER — OXYCODONE-ACETAMINOPHEN 5-325 MG PO TABS: 1.0000 | ORAL_TABLET | ORAL | Status: AC | PRN

## 2011-12-15 NOTE — Progress Notes (Signed)
OT Progress Note  All education completed and goals met. Pt has no further acute OT needs.  Pt doing much better today.  Continue to recommend HHOT. OT signing off.  12/15/2011 Cipriano Mile OTR/L Pager [phone] Office [phone]

## 2011-12-15 NOTE — Discharge Summary (Signed)
Physician Discharge Summary   Patient ID: Alexandra Wolfe MRN: [id number] DOB/AGE: [birth year] 66 y.o.  Admit date: 12/13/2011 Discharge date: 12/15/2011  Primary Diagnosis: Left shoulder osteoarthritis, biceps tear, SLAP lesion, and right  shoulder pain.  Admission Diagnoses: Past Medical History  Diagnosis Date  . ALLERGIC RHINITIS 04/02/2009  . HYPERLIPIDEMIA 03/11/2008  . COLONIC POLYPS, HX OF 09/13/2007  . DEPRESSION 09/13/2007  . OSTEOARTHROSIS NOS, LOWER LEG 09/13/2007  . Chronic LBP   . BACK PAIN 09/27/2008  . BUNIONS, BILATERAL 12/23/2007  . CHEST DISCOMFORT, ATYPICAL 11/07/2009  . CONSTIPATION 09/27/2008  . DYSPNEA ON EXERTION 02/06/2010  . Eustachian tube dysfunction 05/20/2011  . FOOT PAIN, RIGHT 09/27/2008  . LBP (low back pain) 05/20/2011  . SHOULDER PAIN, LEFT 02/14/2009  . SYMPTOM, PALPITATIONS 09/13/2007  . URINARY INCONTINENCE 08/23/2009  . Vertigo 05/20/2011  . Leukocytosis 11/20/2011  . SVT (supraventricular tachycardia)   . Diabetes mellitus     diet controlled    Discharge Diagnoses:  Principal Problem:  *Shoulder pain  Procedure: Procedure(s) (LRB): ARTHROSCOPY SHOULDER (Left)   Consults: none  HPI: The patient is a 66 year old female with worsening left  shoulder pain and declining function and range of motion secondary to a significant shoulder arthritis as well as advanced labral degeneration and tearing, and a large inferior humeral spur. The patient was counseled regarding options for treatment including conservative management, which she had done and failed. Arthroscopy with spur removal versus total shoulder arthroplasty. Given the patient's high physical demand level and young age of 44, we elected to proceed with shoulder arthroscopy and debridement and spur excision to try to improve range of motion and decrease her pain, with an intact rotator cuff.  Laboratory Data: Hospital Outpatient Visit on 12/09/2011  Component Date Value Range Status  .  MRSA, PCR  12/09/2011 NEGATIVE  NEGATIVE Final  . Staphylococcus aureus  12/09/2011 POSITIVE* NEGATIVE Final   Comment:                                 The Xpert SA Assay (FDA                          approved for NASAL specimens                          only), is one component of                          a comprehensive surveillance                          program.  It is not intended                          to diagnose infection nor to                          guide or monitor treatment.  . Sodium (mEq/L) 12/09/2011 139  135-145 Final  . Potassium (mEq/L) 12/09/2011 5.1  3.5-5.1 Final  . Chloride (mEq/L) 12/09/2011 102  96-112 Final  . CO2 (mEq/L) 12/09/2011 27  19-32 Final  . Glucose, Bld (mg/dL) 40/98/1191 478* 29-56 Final  . BUN (mg/dL) 21/30/8657 15  8-46 Final  . Creatinine,  Ser (mg/dL) 16/09/9603 5.40  9.81-1.91 Final  . Calcium (mg/dL) 47/82/9562 13.0  8.6-57.8 Final  . GFR calc non Af Amer (mL/min) 12/09/2011 63* >90 Final  . GFR calc Af Amer (mL/min) 12/09/2011 74* >90 Final   Comment:                                 The eGFR has been calculated                          using the CKD EPI equation.                          This calculation has not been                          validated in all clinical                          situations.                          eGFR's persistently                          <90 mL/min signify                          possible Chronic Kidney Disease.  . WBC (K/uL) 12/09/2011 12.5* 4.0-10.5 Final  . RBC (MIL/uL) 12/09/2011 4.82  3.87-5.11 Final  . Hemoglobin (g/dL) 46/96/2952 84.1* 32.4-40.1 Final  . HCT (%) 12/09/2011 43.4  36.0-46.0 Final  . MCV (fL) 12/09/2011 90.0  78.0-100.0 Final  . MCH (pg) 12/09/2011 31.3  26.0-34.0 Final  . MCHC (g/dL) 02/72/5366 44.0  34.7-42.5 Final  . RDW (%) 12/09/2011 12.8  11.5-15.5 Final  . Platelets (K/uL) 12/09/2011 337  150-400 Final    Basename 12/14/11 0721  HGB 12.6    Basename 12/14/11 0721   WBC --  RBC --  HCT 38.5  PLT --    Basename 12/14/11 0721  NA 135  K 5.6*  CL 102  CO2 24  BUN 14  CREATININE 0.96  GLUCOSE 115*  CALCIUM 8.7   No results found for this basename: LABPT:2,INR:2 in the last 72 hours  X-Rays:Dg Chest 2 View  12/09/2011  *RADIOLOGY REPORT*  Clinical Data: Preoperative evaluation.  Diabetes.  Former smoker.  CHEST - 2 VIEW  Comparison: 05/08/2010.  Findings: The cardiac silhouette is normal size and shape. Nonaneurysmal aortic calcifications are present. The lungs are well aerated and free of infiltrates. No pleural abnormality is evident. Degenerative spondylosis compatible with age. Cholecystectomy clips.  IMPRESSION: No acute or active cardiopulmonary abnormalities are seen.  Stable chronic findings are described above.  Original Report Authenticated By: Crawford Givens, M.D.   Dg Shoulder Left  12/13/2011  *RADIOLOGY REPORT*  Clinical Data: Status post left shoulder surgery  LEFT SHOULDER - 2+ VIEW  Comparison: 03/30/2009  Findings: Prior left shoulder arthroplasty is identified.  No dislocations.  There is an ossific density within the acromial humeral interval which measures approximately 6.7 mm.  This appears new from previous exam and may represent a loose body within the joint space.  IMPRESSION:  1.  Small ossific density within the acromiohumeral  interval. Cannot rule out loose body  Original Report Authenticated By: Rosealee Albee, M.D.    EKG: Orders placed in visit on 11/20/11  . EKG 12-LEAD    Hospital Course: Patient was admitted to [facility] and taken to the OR and underwent the above state procedure without complications.  Patient tolerated the procedure well and was later transferred to the recovery room and then to the orthopaedic floor for postoperative care.  They were given PO and IV analgesics for pain control following their surgery.  They were given postoperative antibiotics.  OT was ordered for ADLs. Patient had a tough night  on the evening of surgery and started to get up with OT on day one.  She was unable to tolerate OOB activity on day one but encouraged to sit up in recliner with assist from nursing staff Continued to progress with therapy into day two.  Dressing was changed on day two and the incision was healing well.  Feeling better and pain was controlled better with oral meds and then discharged on day two.   Discharge Medications: Prior to Admission medications   Medication Sig Start Date End Date Taking? Authorizing Provider  albuterol (PROAIR HFA) 108 (90 BASE) MCG/ACT inhaler Inhale 2 puffs into the lungs every 4 (four) hours as needed. For shortness of breath   Yes Historical Provider, MD  aspirin 81 MG tablet Take 81 mg by mouth daily.     Yes Historical Provider, MD  atenolol (TENORMIN) 25 MG tablet TAKE ONE TABLET BY MOUTH EVERY DAY 06/21/11  Yes Oliver Barre, MD  atorvastatin (LIPITOR) 10 MG tablet Take 1 tablet (10 mg total) by mouth daily. 11/20/11 11/19/12 Yes Oliver Barre, MD  Cholecalciferol (VITAMIN D) 2000 UNITS tablet Take 2,000 Units by mouth daily.     Yes Historical Provider, MD  clonazePAM (KLONOPIN) 1 MG tablet 1/2-1 by mouth three times a day as needed    Yes Historical Provider, MD  desvenlafaxine (PRISTIQ) 100 MG 24 hr tablet Take 50 mg by mouth daily.    Yes Historical Provider, MD  fluticasone (FLONASE) 50 MCG/ACT nasal spray Place 2 sprays into the nose daily as needed. For allergies   Yes Historical Provider, MD  lamoTRIgine (LAMICTAL) 150 MG tablet Take 150 mg by mouth daily.     Yes Historical Provider, MD  traZODone (DESYREL) 100 MG tablet Take 100 mg by mouth at bedtime. At bedtime   Yes Historical Provider, MD  methocarbamol (ROBAXIN) 500 MG tablet Take 1 tablet (500 mg total) by mouth every 6 (six) hours as needed. 12/15/11 12/25/11  Alexzandrew Perkins, PA  oxyCODONE-acetaminophen (PERCOCET) 5-325 MG per tablet Take 1-2 tablets by mouth every 4 (four) hours as needed. 12/15/11 12/25/11   Alexzandrew Perkins, PA    Diet: heart healthy  Activity: up ad lib, sling for the arm  Follow-up:in 2 weeks  Disposition: home  Discharged Condition: fair   Discharge Orders    Future Appointments: Provider: Department: Dept Phone: Center:   02/14/2012 3:30 PM Oliver Barre, MD [facility] [phone] [facility]     Future Orders Please Complete By Expires   Diet - low sodium heart healthy      Call MD / Call 911      Comments:   If you experience chest pain or shortness of breath, CALL 911 and be transported to the hospital emergency room.  If you develope a fever above 101 F, pus (white drainage) or increased drainage or redness at the wound, or  calf pain, call your surgeon's office.   Constipation Prevention      Comments:   Drink plenty of fluids.  Prune juice may be helpful.  You may use a stool softener, such as Colace (over the counter) 100 mg twice a day.  Use MiraLax (over the counter) for constipation as needed.   Increase activity slowly as tolerated      Weight Bearing as taught in Physical Therapy      Comments:   Use a walker or crutches as instructed.   Discharge instructions      Comments:   Pick up stool softner and laxative for home. Do not submerge incision under water. Continue to use ice for pain and swelling from surgery.    Driving restrictions      Comments:   No driving   Lifting restrictions      Comments:   No lifting     Discharge Medication List as of 12/15/2011 11:32 AM    START taking these medications   Details  methocarbamol (ROBAXIN) 500 MG tablet Take 1 tablet (500 mg total) by mouth every 6 (six) hours as needed., Starting 12/15/2011, Until Wed 12/25/11, No Print    oxyCODONE-acetaminophen (PERCOCET) 5-325 MG per tablet Take 1-2 tablets by mouth every 4 (four) hours as needed., Starting 12/15/2011, Until Wed 12/25/11, No Print      CONTINUE these medications which have NOT CHANGED   Details  albuterol (PROAIR HFA) 108 (90 BASE) MCG/ACT inhaler  Inhale 2 puffs into the lungs every 4 (four) hours as needed. For shortness of breath, Until Discontinued, Historical Med    aspirin 81 MG tablet Take 81 mg by mouth daily.  , Until Discontinued, Historical Med    atenolol (TENORMIN) 25 MG tablet TAKE ONE TABLET BY MOUTH EVERY DAY, Normal    atorvastatin (LIPITOR) 10 MG tablet Take 1 tablet (10 mg total) by mouth daily., Starting 11/20/2011, Until Thu 11/19/12, Normal    Cholecalciferol (VITAMIN D) 2000 UNITS tablet Take 2,000 Units by mouth daily.  , Until Discontinued, Historical Med    clonazePAM (KLONOPIN) 1 MG tablet 1/2-1 by mouth three times a day as needed , Until Discontinued, Historical Med    desvenlafaxine (PRISTIQ) 100 MG 24 hr tablet Take 50 mg by mouth daily. , Until Discontinued, Historical Med    fluticasone (FLONASE) 50 MCG/ACT nasal spray Place 2 sprays into the nose daily as needed. For allergies, Until Discontinued, Historical Med    lamoTRIgine (LAMICTAL) 150 MG tablet Take 150 mg by mouth daily.  , Until Discontinued, Historical Med    traZODone (DESYREL) 100 MG tablet Take 100 mg by mouth at bedtime. At bedtime, Until Discontinued, Historical Med       Follow-up Information    Follow up with NORRIS,STEVEN R. Make an appointment in 2 weeks.   Contact information:   [facility] [address], [address] [place] [address] [phone]          Signed: Patrica Duel 12/15/2011, 9:07 PM

## 2011-12-15 NOTE — Progress Notes (Signed)
Subjective: 2 Days Post-Op Procedure(s) (LRB): ARTHROSCOPY SHOULDER (Left) Patient reports pain as mild and moderate.   Patient seen in rounds with Dr. Lequita Halt. Patient has complaints of still having pain but the pills are helping. Ready to go home today.  Objective: Vital signs in last 24 hours: Temp:  [98.1 F (36.7 C)-99.8 F (37.7 C)] 98.1 F (36.7 C) (01/06 0620) Pulse Rate:  [70-76] 70  (01/06 0620) Resp:  [16-18] 16  (01/06 0620) BP: (110-150)/(57-75) 119/57 mmHg (01/06 0620) SpO2:  [91 %-96 %] 91 % (01/06 0620)  Intake/Output from previous day:  Intake/Output Summary (Last 24 hours) at 12/15/11 0937 Last data filed at 12/15/11 0700  Gross per 24 hour  Intake    970 ml  Output      3 ml  Net    967 ml    Intake/Output this shift:    Labs:  Basename 12/14/11 0721  HGB 12.6    Basename 12/14/11 0721  WBC --  RBC --  HCT 38.5  PLT --    Basename 12/14/11 0721  NA 135  K 5.6*  CL 102  CO2 24  BUN 14  CREATININE 0.96  GLUCOSE 115*  CALCIUM 8.7   No results found for this basename: LABPT:2,INR:2 in the last 72 hours  Exam - Sensation intact distally Intact pulses distally Dressing/Incision - clean, dry, no drainage Motor function intact - moving hand and fingers well.  Past Medical History  Diagnosis Date  . ALLERGIC RHINITIS 04/02/2009  . HYPERLIPIDEMIA 03/11/2008  . COLONIC POLYPS, HX OF 09/13/2007  . DEPRESSION 09/13/2007  . OSTEOARTHROSIS NOS, LOWER LEG 09/13/2007  . Chronic LBP   . BACK PAIN 09/27/2008  . BUNIONS, BILATERAL 12/23/2007  . CHEST DISCOMFORT, ATYPICAL 11/07/2009  . CONSTIPATION 09/27/2008  . DYSPNEA ON EXERTION 02/06/2010  . Eustachian tube dysfunction 05/20/2011  . FOOT PAIN, RIGHT 09/27/2008  . LBP (low back pain) 05/20/2011  . SHOULDER PAIN, LEFT 02/14/2009  . SYMPTOM, PALPITATIONS 09/13/2007  . URINARY INCONTINENCE 08/23/2009  . Vertigo 05/20/2011  . Leukocytosis 11/20/2011  . SVT (supraventricular tachycardia)   . Diabetes  mellitus     diet controlled    Assessment/Plan: 2 Days Post-Op Procedure(s) (LRB): ARTHROSCOPY SHOULDER (Left)  Discharge home with home health [facility] PT and OT  Alexandra Wolfe, Alexandra Wolfe 12/15/2011, 9:37 AM

## 2011-12-15 NOTE — Progress Notes (Signed)
Occupational Therapy Treatment Patient Details Name: Alexandra Wolfe MRN: [id number] DOB: 1946/04/23 Today's Date: 12/15/2011  OT Assessment/Plan OT Assessment/Plan Comments on Treatment Session: Pt has made progress since evaluation and has been getting out of bed with RN staff. Pt is able to demonstrate I with sling education, ADLs, and exercise program. Should be able to d/c later today. (Goal updates in shadow chart) OT Plan: All goals met and education completed, patient discharged from OT services Follow Up Recommendations: Home health OT Equipment Recommended: None recommended by OT OT Goals    OT Treatment Precautions/Restrictions  Precautions Precautions: Shoulder Type of Shoulder Precautions: A/AROM within limits of pain   ADL ADL Upper Body Dressing: Performed;Modified independent Upper Body Dressing Details (indicate cue type and reason): Increased time to thread L UE through sleeve and to fasten front buttons. Where Assessed - Upper Body Dressing: Sitting, bed Toilet Transfer: Simulated;Modified independent Toilet Transfer Details (indicate cue type and reason): Pt ambulated from bed to sink to perform pendulum exercises. Toilet Transfer Method: Ambulating ADL Comments: Pt able to don/doff LUE sling with I sitting EOB. Mobility  Bed Mobility Bed Mobility: Yes Supine to Sit: 6: Modified independent (Device/Increase time) Sitting - Scoot to Edge of Bed: 6: Modified independent (Device/Increase time) Sit to Supine - Left: 6: Modified independent (Device/Increase time) Transfers Transfers: Yes Sit to Stand: 6: Modified independent (Device/Increase time);From bed Stand to Sit: 6: Modified independent (Device/Increase time);To bed Exercises Shoulder Exercises Pendulum Exercise: Left;PROM;Standing Shoulder Flexion: AAROM;Left;Seated Shoulder ABduction: AAROM;Left;Seated Elbow Flexion: AROM;Left;Seated  End of Session OT - End of Session Equipment Utilized During  Treatment: Other (comment) (LUE sling) Activity Tolerance: Patient tolerated treatment well Patient left: in bed;with call bell in reach General Behavior During Session: [facility] for tasks performed Cognition: [facility] for tasks performed  Cipriano Mile  12/15/2011, 1:19 PM 12/15/2011 Cipriano Mile OTR/L Pager [phone] Office [phone]

## 2011-12-16 ENCOUNTER — Encounter (HOSPITAL_COMMUNITY): Payer: Self-pay | Admitting: Orthopedic Surgery

## 2011-12-17 ENCOUNTER — Ambulatory Visit: Payer: Medicare Other | Attending: Orthopedic Surgery | Admitting: Physical Therapy

## 2011-12-17 DIAGNOSIS — M25619 Stiffness of unspecified shoulder, not elsewhere classified: Secondary | ICD-10-CM | POA: Insufficient documentation

## 2011-12-17 DIAGNOSIS — M25519 Pain in unspecified shoulder: Secondary | ICD-10-CM | POA: Insufficient documentation

## 2011-12-17 DIAGNOSIS — IMO0001 Reserved for inherently not codable concepts without codable children: Secondary | ICD-10-CM | POA: Insufficient documentation

## 2011-12-19 ENCOUNTER — Ambulatory Visit: Payer: Medicare Other | Admitting: Rehabilitative and Restorative Service Providers"

## 2011-12-24 ENCOUNTER — Encounter: Payer: Medicare Other | Admitting: Rehabilitative and Restorative Service Providers"

## 2011-12-25 ENCOUNTER — Ambulatory Visit: Payer: Medicare Other | Admitting: Rehabilitation

## 2011-12-26 ENCOUNTER — Encounter: Payer: Medicare Other | Admitting: Rehabilitative and Restorative Service Providers"

## 2011-12-31 ENCOUNTER — Ambulatory Visit: Payer: Medicare Other | Admitting: Physical Therapy

## 2012-01-02 ENCOUNTER — Ambulatory Visit: Payer: Medicare Other | Admitting: Physical Therapy

## 2012-01-07 ENCOUNTER — Ambulatory Visit: Payer: Medicare Other | Admitting: Rehabilitation

## 2012-01-09 ENCOUNTER — Ambulatory Visit: Payer: Medicare Other | Admitting: Physical Therapy

## 2012-01-14 ENCOUNTER — Ambulatory Visit: Payer: Medicare Other | Attending: Internal Medicine | Admitting: Physical Therapy

## 2012-01-14 DIAGNOSIS — M25619 Stiffness of unspecified shoulder, not elsewhere classified: Secondary | ICD-10-CM | POA: Insufficient documentation

## 2012-01-14 DIAGNOSIS — M25519 Pain in unspecified shoulder: Secondary | ICD-10-CM | POA: Insufficient documentation

## 2012-01-14 DIAGNOSIS — IMO0001 Reserved for inherently not codable concepts without codable children: Secondary | ICD-10-CM | POA: Insufficient documentation

## 2012-01-15 ENCOUNTER — Other Ambulatory Visit: Payer: Self-pay | Admitting: Internal Medicine

## 2012-01-16 ENCOUNTER — Ambulatory Visit: Payer: Medicare Other | Admitting: Physical Therapy

## 2012-01-21 ENCOUNTER — Encounter: Payer: Medicare Other | Admitting: Physical Therapy

## 2012-01-23 ENCOUNTER — Ambulatory Visit: Payer: Medicare Other | Admitting: Physical Therapy

## 2012-01-28 ENCOUNTER — Encounter: Payer: Medicare Other | Admitting: Physical Therapy

## 2012-01-30 ENCOUNTER — Ambulatory Visit: Payer: Medicare Other | Admitting: Physical Therapy

## 2012-02-05 ENCOUNTER — Encounter: Payer: Medicare Other | Admitting: Physical Therapy

## 2012-02-06 ENCOUNTER — Ambulatory Visit: Payer: Medicare Other | Admitting: Physical Therapy

## 2012-02-10 ENCOUNTER — Encounter: Payer: Self-pay | Admitting: Internal Medicine

## 2012-02-13 ENCOUNTER — Ambulatory Visit: Payer: Medicare Other | Attending: Orthopedic Surgery | Admitting: Physical Therapy

## 2012-02-13 DIAGNOSIS — IMO0001 Reserved for inherently not codable concepts without codable children: Secondary | ICD-10-CM | POA: Insufficient documentation

## 2012-02-13 DIAGNOSIS — M25619 Stiffness of unspecified shoulder, not elsewhere classified: Secondary | ICD-10-CM | POA: Insufficient documentation

## 2012-02-13 DIAGNOSIS — M25519 Pain in unspecified shoulder: Secondary | ICD-10-CM | POA: Insufficient documentation

## 2012-02-14 ENCOUNTER — Ambulatory Visit: Payer: Medicare Other | Admitting: Internal Medicine

## 2012-02-20 ENCOUNTER — Encounter: Payer: Medicare Other | Admitting: Physical Therapy

## 2012-03-05 ENCOUNTER — Ambulatory Visit (AMBULATORY_SURGERY_CENTER): Payer: Medicare Other | Admitting: *Deleted

## 2012-03-05 ENCOUNTER — Encounter: Payer: Self-pay | Admitting: Internal Medicine

## 2012-03-05 VITALS — Ht 66.5 in | Wt 192.5 lb

## 2012-03-05 DIAGNOSIS — Z1211 Encounter for screening for malignant neoplasm of colon: Secondary | ICD-10-CM

## 2012-03-05 MED ORDER — PEG-KCL-NACL-NASULF-NA ASC-C 100 G PO SOLR: ORAL | Status: DC

## 2012-03-12 ENCOUNTER — Telehealth: Payer: Self-pay

## 2012-03-12 NOTE — Telephone Encounter (Signed)
The original plan was for f/u at one yr from her last visit, but we could order tests now as it has been approx 6 mo, for sugar and cholesterol

## 2012-03-12 NOTE — Telephone Encounter (Signed)
The patient called stating she thinks she was told to return in April for labs, please advise

## 2012-03-13 NOTE — Telephone Encounter (Signed)
Informed the patient of MD's information. She stated she would rather wait for one yr. To do labs.

## 2012-03-20 ENCOUNTER — Encounter: Payer: Self-pay | Admitting: Internal Medicine

## 2012-03-20 ENCOUNTER — Ambulatory Visit (AMBULATORY_SURGERY_CENTER): Payer: Medicare Other | Admitting: Internal Medicine

## 2012-03-20 VITALS — BP 121/63 | HR 64 | Temp 95.9°F | Resp 23 | Ht 66.0 in | Wt 192.0 lb

## 2012-03-20 DIAGNOSIS — Z860100 Personal history of colon polyps, unspecified: Secondary | ICD-10-CM

## 2012-03-20 DIAGNOSIS — Z1211 Encounter for screening for malignant neoplasm of colon: Secondary | ICD-10-CM

## 2012-03-20 LAB — GLUCOSE, CAPILLARY
Glucose-Capillary: 107 mg/dL — ABNORMAL HIGH (ref 70–99)
Glucose-Capillary: 90 mg/dL (ref 70–99)

## 2012-03-20 MED ORDER — SODIUM CHLORIDE 0.9 % IV SOLN: 500.0000 mL | INTRAVENOUS | Status: DC

## 2012-03-20 NOTE — Op Note (Signed)
[facility] [address] [place], [place]  [address]  COLONOSCOPY PROCEDURE REPORT  PATIENT:  Alexandra, Wolfe  MR#:  [id number] BIRTHDATE:  [birth year], 65 yrs. old  GENDER:  female ENDOSCOPIST:  Alexandra Morton. Juanda Chance, MD REF. BY:  Oliver Barre, M.D. PROCEDURE DATE:  03/20/2012 PROCEDURE:  Colonoscopy 19147 ASA CLASS:  Class II INDICATIONS:  history of pre-cancerous (adenomatous) colon polyps tub.adenoma 2002, no polyps in 2007 MEDICATIONS:   MAC sedation, administered by CRNA, propofol (Diprivan) 300 mg  DESCRIPTION OF PROCEDURE:   After the risks and benefits and of the procedure were explained, informed consent was obtained. Digital rectal exam was performed and revealed no rectal masses. The LB [id number] endoscope was introduced through the anus and advanced to the cecum, which was identified by both the appendix and ileocecal valve.  The quality of the prep was excellent, using MoviPrep.  The instrument was then slowly withdrawn as the colon was fully examined. <<PROCEDUREIMAGES>>  FINDINGS:  No polyps or cancers were seen (see image1, image2, image3, and image4).   Retroflexed views in the rectum revealed no abnormalities.    The scope was then withdrawn from the patient and the procedure completed.  COMPLICATIONS:  None ENDOSCOPIC IMPRESSION: 1) No polyps or cancers 2) Normal colonoscopy RECOMMENDATIONS: 1) High fiber diet.  REPEAT EXAM:  In 10 year(s) for.  ______________________________ Alexandra Morton. Juanda Chance, MD  CC:  n. eSIGNED:   [name]. [name] at 03/20/2012 12:16 PM  [name], [phone]

## 2012-03-20 NOTE — Patient Instructions (Signed)
YOU HAD AN ENDOSCOPIC PROCEDURE TODAY AT THE [facility]: Refer to the procedure report that was given to you for any specific questions about what was found during the examination.  If the procedure report does not answer your questions, please call your gastroenterologist to clarify.  If you requested that your care partner not be given the details of your procedure findings, then the procedure report has been included in a sealed envelope for you to review at your convenience later.  YOU SHOULD EXPECT: Some feelings of bloating in the abdomen. Passage of more gas than usual.  Walking can help get rid of the air that was put into your GI tract during the procedure and reduce the bloating. If you had a lower endoscopy (such as a colonoscopy or flexible sigmoidoscopy) you may notice spotting of blood in your stool or on the toilet paper. If you underwent a bowel prep for your procedure, then you may not have a normal bowel movement for a few days.  DIET: Your first meal following the procedure should be a light meal and then it is ok to progress to your normal diet.  A half-sandwich or bowl of soup is an example of a good first meal.  Heavy or fried foods are harder to digest and may make you feel nauseous or bloated.  Likewise meals heavy in dairy and vegetables can cause extra gas to form and this can also increase the bloating.  Drink plenty of fluids but you should avoid alcoholic beverages for 24 hours.  ACTIVITY: Your care partner should take you home directly after the procedure.  You should plan to take it easy, moving slowly for the rest of the day.  You can resume normal activity the day after the procedure however you should NOT DRIVE or use heavy machinery for 24 hours (because of the sedation medicines used during the test).    SYMPTOMS TO REPORT IMMEDIATELY: A gastroenterologist can be reached at any hour.  During normal business hours, 8:30 AM to 5:00 PM Monday through Friday,  call [phone].  After hours and on weekends, please call the GI answering service at [phone] who will take a message and have the physician on call contact you.   Following lower endoscopy (colonoscopy or flexible sigmoidoscopy):  Excessive amounts of blood in the stool  Significant tenderness or worsening of abdominal pains  Swelling of the abdomen that is new, acute  Fever of 100F or higher YOU HAD AN ENDOSCOPIC PROCEDURE TODAY AT THE [facility]: Refer to the procedure report that was given to you for any specific questions about what was found during the examination.  If the procedure report does not answer your questions, please call your gastroenterologist to clarify.  If you requested that your care partner not be given the details of your procedure findings, then the procedure report has been included in a sealed envelope for you to review at your convenience later.  YOU SHOULD EXPECT: Some feelings of bloating in the abdomen. Passage of more gas than usual.  Walking can help get rid of the air that was put into your GI tract during the procedure and reduce the bloating. If you had a lower endoscopy (such as a colonoscopy or flexible sigmoidoscopy) you may notice spotting of blood in your stool or on the toilet paper. If you underwent a bowel prep for your procedure, then you may not have a normal bowel movement for a few days.  DIET:   Your first meal following the procedure should be a light meal and then it is ok to progress to your normal diet.  A half-sandwich or bowl of soup is an example of a good first meal.  Heavy or fried foods are harder to digest and may make you feel nauseous or bloated.  Likewise meals heavy in dairy and vegetables can cause extra gas to form and this can also increase the bloating.  Drink plenty of fluids but you should avoid alcoholic beverages for 24 hours.  ACTIVITY: Your care partner should take you home directly after the procedure.   You should plan to take it easy, moving slowly for the rest of the day.  You can resume normal activity the day after the procedure however you should NOT DRIVE or use heavy machinery for 24 hours (because of the sedation medicines used during the test).    SYMPTOMS TO REPORT IMMEDIATELY: A gastroenterologist can be reached at any hour.  During normal business hours, 8:30 AM to 5:00 PM Monday through Friday, call [phone].  After hours and on weekends, please call the GI answering service at [phone] who will take a message and have the physician on call contact you.   Following lower endoscopy (colonoscopy or flexible sigmoidoscopy):  Excessive amounts of blood in the stool  Significant tenderness or worsening of abdominal pains  Swelling of the abdomen that is new, acute  Fever of 100F or higher  Following upper endoscopy (EGD)  Vomiting of blood or coffee ground material  New chest pain or pain under the shoulder blades  Painful or persistently difficult swallowing  New shortness of breath  Fever of 100F or higher  Black, tarry-looking stools  FOLLOW UP: If any biopsies were taken you will be contacted by phone or by letter within the next 1-3 weeks.  Call your gastroenterologist if you have not heard about the biopsies in 3 weeks.  Our staff will call the home number listed on your records the next business day following your procedure to check on you and address any questions or concerns that you may have at that time regarding the information given to you following your procedure. This is a courtesy call and so if there is no answer at the home number and we have not heard from you through the emergency physician on call, we will assume that you have returned to your regular daily activities without incident.  SIGNATURES/CONFIDENTIALITY: You and/or your care partner have signed paperwork which will be entered into your electronic medical record.  These signatures attest  to the fact that that the information above on your After Visit Summary has been reviewed and is understood.  Full responsibility of the confidentiality of this discharge information lies with you and/or your care-partner.  FOLLOW UP: If any biopsies were taken you will be contacted by phone or by letter within the next 1-3 weeks.  Call your gastroenterologist if you have not heard about the biopsies in 3 weeks.  Our staff will call the home number listed on your records the next business day following your procedure to check on you and address any questions or concerns that you may have at that time regarding the information given to you following your procedure. This is a courtesy call and so if there is no answer at the home number and we have not heard from you through the emergency physician on call, we will assume that you have returned to your regular daily   activities without incident.  SIGNATURES/CONFIDENTIALITY: You and/or your care partner have signed paperwork which will be entered into your electronic medical record.  These signatures attest to the fact that that the information above on your After Visit Summary has been reviewed and is understood.  Full responsibility of the confidentiality of this discharge information lies with you and/or your care-partner.  

## 2012-03-20 NOTE — Progress Notes (Signed)
Patient did not have preoperative order for IV antibiotic SSI prophylaxis. (G8918)  Patient did not experience any of the following events: a burn prior to discharge; a fall within the facility; wrong site/side/patient/procedure/implant event; or a hospital transfer or hospital admission upon discharge from the facility. (G8907)  

## 2012-03-20 NOTE — Progress Notes (Signed)
Patient did not have preoperative order for IV antibiotic SSI prophylaxis. (G8918)   

## 2012-03-23 ENCOUNTER — Telehealth: Payer: Self-pay | Admitting: *Deleted

## 2012-03-23 NOTE — Telephone Encounter (Signed)
  Follow up Call-  Call back number 03/20/2012  Post procedure Call Back phone  # [phone]  Permission to leave phone message Yes      Left message that we called to check on pt.

## 2012-03-24 ENCOUNTER — Other Ambulatory Visit: Payer: Self-pay

## 2012-03-24 MED ORDER — ALBUTEROL SULFATE HFA 108 (90 BASE) MCG/ACT IN AERS: 2.0000 | INHALATION_SPRAY | RESPIRATORY_TRACT | Status: DC | PRN

## 2012-04-20 ENCOUNTER — Encounter: Payer: Medicare Other | Attending: Internal Medicine | Admitting: *Deleted

## 2012-04-20 ENCOUNTER — Encounter: Payer: Self-pay | Admitting: *Deleted

## 2012-04-20 VITALS — Ht 66.5 in | Wt 192.5 lb

## 2012-04-20 DIAGNOSIS — Z713 Dietary counseling and surveillance: Secondary | ICD-10-CM | POA: Insufficient documentation

## 2012-04-20 DIAGNOSIS — R7309 Other abnormal glucose: Secondary | ICD-10-CM | POA: Insufficient documentation

## 2012-04-20 DIAGNOSIS — R7302 Impaired glucose tolerance (oral): Secondary | ICD-10-CM

## 2012-04-20 NOTE — Progress Notes (Signed)
  Medical Nutrition Therapy:  Appt start time: 1115 end time:  1215.  Assessment:  Primary concerns today: patient here for assistance with weight loss and impaired glucose tolerance. She states her husband is so ill she can no longer care for him so he is in Nursing Home. She has had to move to public housing due to cost of his care. She states the neighborhood is not safe for her to walk in, so she hasn't walked since she moved there. She does have ideas of trails and parks she would walking going forward.   MEDICATIONS: see list   DIETARY INTAKE:  Usual eating pattern includes 2-3 meals and 1 snacks per day.  Everyday foods include fair variety of all food groups.  Avoided foods include fried and high fat foods, sweets lately.    24-hr recall:  B ( AM): skip, coffee with Spenda and flavored creamer OR egg sandwich, occasionally yogurt Snk ( AM): none  L ( PM): eat at [employer] vegetable meal, sweet tea Snk ( PM): none D ( PM): 1/2 PNB jelly or pimento cheese sandwich, skim milk 10-12 oz Snk ( PM): yogurt OR graham cracker Beverages: coffee, sweet tea, diet Coke, milk  Usual physical activity: limited to shopping weekly  Estimated energy needs: 1400 calories 158 g carbohydrates 105 g protein 39 g fat  Progress Towards Goal(s):  In progress.   Nutritional Diagnosis:  NI-1.5 Excessive energy intake As related to activity level.  As evidenced by BMI of 30.7% .    Intervention:  Nutrition counseling and basic diabetes education provided. She does not have a BG meter yet, but assisted her husband with his diabetes when he lived at home. Discussed value of consistent carb meals and increasing her activity level thru walking on trails where she feels safe. Plan: Aim for 3 Carb choices (45 grams) per meal +/- 1 either way Aim for 0-1 Carb choice per snack if hungry Consider walking on trails or outside 10-15 minutes in AM and again in afternoon as tolerated Read fool labels for Total  Carbohydrate of foods in your home  Handouts given during visit include: Living Well with Diabetes Carb Counting and Food Label handouts Meal Plan Card  Monitoring/Evaluation:  Dietary intake, exercise, reading food labels, and body weight in 4 week(s).

## 2012-04-20 NOTE — Patient Instructions (Addendum)
Plan: Aim for 3 Carb choices (45 grams) per meal +/- 1 either way Aim for 0-1 Carb choice per snack if hungry Consider walking on trails or outside 10-15 minutes in AM and again in afternoon as tolerated Read fool labels for Total Carbohydrate of foods in your home

## 2012-05-26 ENCOUNTER — Encounter: Payer: Self-pay | Admitting: Internal Medicine

## 2012-05-26 ENCOUNTER — Ambulatory Visit (INDEPENDENT_AMBULATORY_CARE_PROVIDER_SITE_OTHER): Payer: Medicare Other | Admitting: Internal Medicine

## 2012-05-26 VITALS — BP 120/82 | HR 76 | Temp 98.4°F | Ht 66.5 in | Wt 193.1 lb

## 2012-05-26 DIAGNOSIS — R7309 Other abnormal glucose: Secondary | ICD-10-CM

## 2012-05-26 DIAGNOSIS — F329 Major depressive disorder, single episode, unspecified: Secondary | ICD-10-CM

## 2012-05-26 DIAGNOSIS — R7302 Impaired glucose tolerance (oral): Secondary | ICD-10-CM

## 2012-05-26 DIAGNOSIS — R21 Rash and other nonspecific skin eruption: Secondary | ICD-10-CM

## 2012-05-26 MED ORDER — VALACYCLOVIR HCL 1 G PO TABS: 1000.0000 mg | ORAL_TABLET | Freq: Three times a day (TID) | ORAL | Status: DC

## 2012-05-26 MED ORDER — HYDROCODONE-ACETAMINOPHEN 5-325 MG PO TABS: 1.0000 | ORAL_TABLET | Freq: Four times a day (QID) | ORAL | Status: AC | PRN

## 2012-05-26 MED ORDER — METHYLPREDNISOLONE ACETATE 80 MG/ML IJ SUSP
120.0000 mg | Freq: Once | INTRAMUSCULAR | Status: AC
  Administered 2012-05-26: 120 mg via INTRAMUSCULAR

## 2012-05-26 MED ORDER — HYDROXYZINE HCL 25 MG PO TABS: 25.0000 mg | ORAL_TABLET | Freq: Four times a day (QID) | ORAL | Status: AC | PRN

## 2012-05-26 MED ORDER — ALBUTEROL SULFATE HFA 108 (90 BASE) MCG/ACT IN AERS: 2.0000 | INHALATION_SPRAY | Freq: Four times a day (QID) | RESPIRATORY_TRACT | Status: DC | PRN

## 2012-05-26 NOTE — Patient Instructions (Addendum)
You had the steroid shot today Take all new medications as prescribed - the valtrex antibiotic, pain medication, and hydroxyzine as needed for itching Continue all other medications as before Please call in 1-2 weeks if the pain persists, as you may need gabapentin for "post-herpetic neuralgia" Your albuterol was refilled today Please have the pharmacy call with any other refills you may need.

## 2012-05-31 ENCOUNTER — Encounter: Payer: Self-pay | Admitting: Internal Medicine

## 2012-05-31 NOTE — Progress Notes (Signed)
Subjective:    Patient ID: Alexandra Wolfe, female    DOB: 07/03/1946, 66 y.o.   MRN: [id number]  HPI  Here with acute onset rash to left flank/side and left abd area with mod pain, itch for 2 wks, most areas now crusted.   Pt denies fever, wt loss, night sweats, loss of appetite, or other constitutional symptoms  Pt denies chest pain, increased sob or doe, wheezing, orthopnea, PND, increased LE swelling, palpitations, dizziness or syncope.   Pt denies polydipsia, polyuria. Denies worsening depressive symptoms, suicidal ideation, or panic, though has ongoing anxiety, not increased recently.  Past Medical History  Diagnosis Date  . HYPERLIPIDEMIA 03/11/2008  . COLONIC POLYPS, HX OF 09/13/2007  . DEPRESSION 09/13/2007  . OSTEOARTHROSIS NOS, LOWER LEG 09/13/2007  . Chronic LBP   . BACK PAIN 09/27/2008  . BUNIONS, BILATERAL 12/23/2007  . CHEST DISCOMFORT, ATYPICAL 11/07/2009  . CONSTIPATION 09/27/2008  . DYSPNEA ON EXERTION 02/06/2010  . Eustachian tube dysfunction 05/20/2011  . FOOT PAIN, RIGHT 09/27/2008  . LBP (low back pain) 05/20/2011  . SHOULDER PAIN, LEFT 02/14/2009  . SYMPTOM, PALPITATIONS 09/13/2007  . URINARY INCONTINENCE 08/23/2009  . Vertigo 05/20/2011  . Leukocytosis 11/20/2011  . SVT (supraventricular tachycardia)   . Diabetes mellitus     diet controlled  . Anxiety   . ALLERGIC RHINITIS 04/02/2009    no per pt   Past Surgical History  Procedure Date  . Ccx   . Lumbar laminectomy   . Cholecystectomy   . Lumbar laminectomy   . Bunionectomy     right  . Shoulder arthroscopy 12/13/2011    Procedure: ARTHROSCOPY SHOULDER;  Surgeon: Verlee Rossetti;  Location: [facility] OR;  Service: Orthopedics;  Laterality: Left;  Left Shoulder ArthroscopyDebridement Limited Tenodesis Open Rotator Cuff Repair Spur Removal Right Shoulder Injection     reports that she has quit smoking. She has never used smokeless tobacco. She reports that she does not drink alcohol or use illicit drugs. family history  includes Colon cancer in her mother; Diabetes in her other; Diabetes type II in her father; and Heart disease in her brother and father.  There is no history of Esophageal cancer, and Stomach cancer, and Rectal cancer, . Allergies  Allergen Reactions  . Aripiprazole     REACTION: agitation  . Simvastatin     REACTION: myalgia   Current Outpatient Prescriptions on File Prior to Visit  Medication Sig Dispense Refill  . albuterol (PROAIR HFA) 108 (90 BASE) MCG/ACT inhaler Inhale 2 puffs into the lungs every 6 (six) hours as needed. For shortness of breath  1 Inhaler  11  . aspirin 81 MG tablet Take 81 mg by mouth daily.        [name] atenolol (TENORMIN) 25 MG tablet TAKE 1 TABLET BY MOUTH EVERY DAY  90 tablet  3  . Cholecalciferol (VITAMIN D) 2000 UNITS tablet Take 2,000 Units by mouth daily.        . clonazePAM (KLONOPIN) 1 MG tablet 1/2-1 by mouth three times a day as needed       . desvenlafaxine (PRISTIQ) 100 MG 24 hr tablet Take 50 mg by mouth daily.       . fluticasone (FLONASE) 50 MCG/ACT nasal spray Place 2 sprays into the nose daily as needed. For allergies      . lamoTRIgine (LAMICTAL) 150 MG tablet Take 150 mg by mouth daily.        . traZODone (DESYREL) 100 MG tablet Take 150 mg  by mouth at bedtime. At bedtime       Review of Systems Review of Systems  Constitutional: Negative for diaphoresis and unexpected weight change.  Eyes: Negative for photophobia and visual disturbance.  Respiratory: Negative for choking and stridor.   Gastrointestinal: Negative for vomiting and blood in stool.  Genitourinary: Negative for hematuria and decreased urine volume.  Musculoskeletal: Negative for gait problem.  Neurological: Negative for tremors and numbness.  Psychiatric/Behavioral: Negative for decreased concentration. The patient is not hyperactive.       Objective:   Physical Exam BP 120/82  Pulse 76  Temp 98.4 F (36.9 C) (Oral)  Ht 5' 6.5" (1.689 m)  Wt 193 lb 2 oz (87.601 kg)  BMI  30.70 kg/m2  SpO2 99% Physical Exam  VS noted Constitutional: Pt appears well-developed and well-nourished.  HENT: Head: Normocephalic.  Right Ear: External ear normal.  Left Ear: External ear normal.  Eyes: Conjunctivae and EOM are normal. Pupils are equal, round, and reactive to light.  Neck: Normal range of motion. Neck supple.  Cardiovascular: Normal rate and regular rhythm.   Pulmonary/Chest: Effort normal and breath sounds normal.  Abd:  Soft, NT, non-distended, + BS Neurological: Pt is alert. Not confused, motor intact Skin: Skin is warm. Typical mult grouped vesicles on erythem based noted left flank/side and abd, tender without swelling or cellulitis Psychiatric: Pt behavior is normal. Thought content normal. not overly nervous or depressed affect    Assessment & Plan:

## 2012-05-31 NOTE — Assessment & Plan Note (Signed)
stable overall by hx and exam, most recent data reviewed with pt, and pt to continue medical treatment as before Lab Results  Component Value Date   WBC 12.5* 12/09/2011   HGB 12.6 12/14/2011   HCT 38.5 12/14/2011   PLT 337 12/09/2011   GLUCOSE 115* 12/14/2011   CHOL 210* 11/18/2011   TRIG 131.0 11/18/2011   HDL 42.30 11/18/2011   LDLDIRECT 143.0 11/18/2011   LDLCALC 112* 02/14/2011   ALT 14 11/18/2011   AST 16 11/18/2011   NA 135 12/14/2011   K 5.6* 12/14/2011   CL 102 12/14/2011   CREATININE 0.96 12/14/2011   BUN 14 12/14/2011   CO2 24 12/14/2011   TSH 2.68 11/18/2011   INR 1.0 ratio 11/13/2009   HGBA1C 5.8 02/14/2011

## 2012-05-31 NOTE — Assessment & Plan Note (Signed)
C/w shingles, for pain control, valtrex asd, atarax for itching,  to f/u any worsening symptoms or concerns

## 2012-05-31 NOTE — Assessment & Plan Note (Signed)
stable overall by hx and exam, most recent data reviewed with pt, and pt to continue medical treatment as before Lab Results  Component Value Date   HGBA1C 5.8 02/14/2011    

## 2012-06-01 ENCOUNTER — Ambulatory Visit: Payer: Medicare Other | Admitting: *Deleted

## 2012-06-17 ENCOUNTER — Encounter: Payer: Self-pay | Admitting: Internal Medicine

## 2012-06-17 ENCOUNTER — Ambulatory Visit (INDEPENDENT_AMBULATORY_CARE_PROVIDER_SITE_OTHER): Payer: Medicare Other | Admitting: Internal Medicine

## 2012-06-17 VITALS — BP 100/70 | HR 64 | Temp 97.2°F | Ht 66.5 in | Wt 193.4 lb

## 2012-06-17 DIAGNOSIS — J309 Allergic rhinitis, unspecified: Secondary | ICD-10-CM

## 2012-06-17 DIAGNOSIS — R21 Rash and other nonspecific skin eruption: Secondary | ICD-10-CM

## 2012-06-17 DIAGNOSIS — R7309 Other abnormal glucose: Secondary | ICD-10-CM

## 2012-06-17 DIAGNOSIS — R7302 Impaired glucose tolerance (oral): Secondary | ICD-10-CM

## 2012-06-17 MED ORDER — METHYLPREDNISOLONE ACETATE 80 MG/ML IJ SUSP
120.0000 mg | Freq: Once | INTRAMUSCULAR | Status: AC
  Administered 2012-06-17: 120 mg via INTRAMUSCULAR

## 2012-06-17 MED ORDER — PREDNISONE 10 MG PO TABS: ORAL_TABLET | ORAL | Status: DC

## 2012-06-17 NOTE — Patient Instructions (Addendum)
You had the steroid shot today Take all new medications as prescribed - the prednisone Continue all other medications as before You can also take Benadryl 25 - 50 mg every 6 hrs as needed for itching

## 2012-06-17 NOTE — Assessment & Plan Note (Addendum)
Shingles resolved, now with ? Poison ivy vs  Atopic dermatitis type rash - for depomedrol IM and predpask asd,  to f/u any worsening symptoms or concerns, consdier derm referral

## 2012-06-21 ENCOUNTER — Encounter: Payer: Self-pay | Admitting: Internal Medicine

## 2012-06-21 NOTE — Assessment & Plan Note (Signed)
stable overall by hx and exam, most recent data reviewed with pt, and pt to continue medical treatment as before, to f/u with onset polys or cbg > 200 Lab Results  Component Value Date   HGBA1C 5.8 02/14/2011

## 2012-06-21 NOTE — Progress Notes (Signed)
Subjective:    Patient ID: Alexandra Wolfe, female    DOB: [birth year], 66 y.o.   MRN: [id number]  HPI  Here with 2-3 days marked pruritic rash to the extremities and right lateral abd area, new and disticint from prior shingles rash after working in the yard, without fever or pain, nothing makes better or worse.  Pt denies chest pain, increased sob or doe, wheezing, orthopnea, PND, increased LE swelling, palpitations, dizziness or syncope.   Pt denies polydipsia, polyuria, .  Pt states overall good compliance with meds, trying to follow lower cholesterol diet, wt overall stable but little exercise however.   Pt denies new neurological symptoms such as new headache, or facial or extremity weakness or numbness  Does have several wks ongoing nasal allergy symptoms with clear congestion, itch and sneeze, without fever, pain, ST, cough or wheezing. Past Medical History  Diagnosis Date  . HYPERLIPIDEMIA 03/11/2008  . COLONIC POLYPS, HX OF 09/13/2007  . DEPRESSION 09/13/2007  . OSTEOARTHROSIS NOS, LOWER LEG 09/13/2007  . Chronic LBP   . BACK PAIN 09/27/2008  . BUNIONS, BILATERAL 12/23/2007  . CHEST DISCOMFORT, ATYPICAL 11/07/2009  . CONSTIPATION 09/27/2008  . DYSPNEA ON EXERTION 02/06/2010  . Eustachian tube dysfunction 05/20/2011  . FOOT PAIN, RIGHT 09/27/2008  . LBP (low back pain) 05/20/2011  . SHOULDER PAIN, LEFT 02/14/2009  . SYMPTOM, PALPITATIONS 09/13/2007  . URINARY INCONTINENCE 08/23/2009  . Vertigo 05/20/2011  . Leukocytosis 11/20/2011  . SVT (supraventricular tachycardia)   . Diabetes mellitus     diet controlled  . Anxiety   . ALLERGIC RHINITIS 04/02/2009    no per pt   Past Surgical History  Procedure Date  . Ccx   . Lumbar laminectomy   . Cholecystectomy   . Lumbar laminectomy   . Bunionectomy     right  . Shoulder arthroscopy 12/13/2011    Procedure: ARTHROSCOPY SHOULDER;  Surgeon: Verlee Rossetti;  Location: [facility] OR;  Service: Orthopedics;  Laterality: Left;  Left Shoulder  ArthroscopyDebridement Limited Tenodesis Open Rotator Cuff Repair Spur Removal Right Shoulder Injection     reports that she has quit smoking. She has never used smokeless tobacco. She reports that she does not drink alcohol or use illicit drugs. family history includes Colon cancer in her mother; Diabetes in her other; Diabetes type II in her father; and Heart disease in her brother and father.  There is no history of Esophageal cancer, and Stomach cancer, and Rectal cancer, . Allergies  Allergen Reactions  . Aripiprazole     REACTION: agitation  . Simvastatin     REACTION: myalgia   Current Outpatient Prescriptions on File Prior to Visit  Medication Sig Dispense Refill  . albuterol (PROAIR HFA) 108 (90 BASE) MCG/ACT inhaler Inhale 2 puffs into the lungs every 6 (six) hours as needed. For shortness of breath  1 Inhaler  11  . aspirin 81 MG tablet Take 81 mg by mouth daily.        [name] atenolol (TENORMIN) 25 MG tablet TAKE 1 TABLET BY MOUTH EVERY DAY  90 tablet  3  . Cholecalciferol (VITAMIN D) 2000 UNITS tablet Take 2,000 Units by mouth daily.        . clonazePAM (KLONOPIN) 1 MG tablet 1/2-1 by mouth three times a day as needed       . desvenlafaxine (PRISTIQ) 100 MG 24 hr tablet Take 50 mg by mouth daily.       . fluticasone (FLONASE) 50 MCG/ACT nasal spray Place  2 sprays into the nose daily as needed. For allergies      . lamoTRIgine (LAMICTAL) 150 MG tablet Take 150 mg by mouth daily.        . traZODone (DESYREL) 100 MG tablet Take 150 mg by mouth at bedtime. At bedtime       Review of Systems Constitutional: Negative for diaphoresis and unexpected weight change.  HENT: Negative for  tinnitus.   Eyes: Negative for photophobia and visual disturbance.  Respiratory: Negative for choking and stridor.   Gastrointestinal: Negative for vomiting and blood in stool.  Genitourinary: Negative for hematuria and decreased urine volume.  Musculoskeletal: Negative for gait problem.  Neurological:  Negative for tremors and numbness.     Objective:   Physical Exam BP 100/70  Pulse 64  Temp 97.2 F (36.2 C) (Oral)  Ht 5' 6.5" (1.689 m)  Wt 193 lb 6 oz (87.714 kg)  BMI 30.74 kg/m2  SpO2 96% Physical Exam  VS noted, not ill appearing Constitutional: Pt appears well-developed and well-nourished.  HENT: Head: Normocephalic.  Right Ear: External ear normal.  Left Ear: External ear normal.  Bilat tm's mild erythema.  Sinus nontender.  Pharynx mild erythema Eyes: Conjunctivae and EOM are normal. Pupils are equal, round, and reactive to light.  Neck: Normal range of motion. Neck supple.  Cardiovascular: Normal rate and regular rhythm.   Pulmonary/Chest: Effort normal and breath sounds normal.  Neurological: Pt is alert. Not confused Skin: with numerous areas typical poison ivy type rash to mult areas extremities and right abdomen;  Prior shingles rash area to the left resolved with some residual postinflammatory hyperpigmentation only Psychiatric: 1+nervous Thought content normal.     Assessment & Plan:

## 2012-06-21 NOTE — Assessment & Plan Note (Signed)
Should also improve with depomedrol IM and predpack today, Continue all other medications as before,  to f/u any worsening symptoms or concerns

## 2012-07-10 ENCOUNTER — Other Ambulatory Visit: Payer: Self-pay | Admitting: Dermatology

## 2012-11-09 ENCOUNTER — Ambulatory Visit (INDEPENDENT_AMBULATORY_CARE_PROVIDER_SITE_OTHER): Payer: Medicare Other | Admitting: Internal Medicine

## 2012-11-09 ENCOUNTER — Encounter: Payer: Self-pay | Admitting: Internal Medicine

## 2012-11-09 ENCOUNTER — Other Ambulatory Visit (INDEPENDENT_AMBULATORY_CARE_PROVIDER_SITE_OTHER): Payer: Medicare Other

## 2012-11-09 VITALS — BP 132/70 | HR 79 | Temp 97.0°F | Ht 66.5 in | Wt 207.0 lb

## 2012-11-09 DIAGNOSIS — E785 Hyperlipidemia, unspecified: Secondary | ICD-10-CM

## 2012-11-09 DIAGNOSIS — Z79899 Other long term (current) drug therapy: Secondary | ICD-10-CM

## 2012-11-09 DIAGNOSIS — Z0000 Encounter for general adult medical examination without abnormal findings: Secondary | ICD-10-CM

## 2012-11-09 DIAGNOSIS — Z23 Encounter for immunization: Secondary | ICD-10-CM

## 2012-11-09 DIAGNOSIS — R7309 Other abnormal glucose: Secondary | ICD-10-CM

## 2012-11-09 DIAGNOSIS — R7302 Impaired glucose tolerance (oral): Secondary | ICD-10-CM

## 2012-11-09 LAB — LDL CHOLESTEROL, DIRECT: Direct LDL: 212.5 mg/dL

## 2012-11-09 LAB — CBC WITH DIFFERENTIAL/PLATELET
Basophils Absolute: 0.1 10*3/uL (ref 0.0–0.1)
Eosinophils Absolute: 0.3 10*3/uL (ref 0.0–0.7)
Hemoglobin: 15.3 g/dL — ABNORMAL HIGH (ref 12.0–15.0)
Lymphocytes Relative: 32 % (ref 12.0–46.0)
MCHC: 33.8 g/dL (ref 30.0–36.0)
Monocytes Absolute: 0.9 10*3/uL (ref 0.1–1.0)
Neutro Abs: 7.8 10*3/uL — ABNORMAL HIGH (ref 1.4–7.7)
RDW: 12.7 % (ref 11.5–14.6)

## 2012-11-09 LAB — URINALYSIS, ROUTINE W REFLEX MICROSCOPIC
Bilirubin Urine: NEGATIVE
Nitrite: NEGATIVE
Specific Gravity, Urine: >=1.03 (ref 1.000–1.030)
Total Protein, Urine: NEGATIVE
pH: 6 (ref 5.0–8.0)

## 2012-11-09 LAB — HEPATIC FUNCTION PANEL
ALT: 17 U/L (ref 0–35)
Alkaline Phosphatase: 81 U/L (ref 39–117)
Bilirubin, Direct: 0.1 mg/dL (ref 0.0–0.3)
Total Protein: 7.5 g/dL (ref 6.0–8.3)

## 2012-11-09 LAB — BASIC METABOLIC PANEL
CO2: 20 mEq/L (ref 19–32)
Chloride: 105 mEq/L (ref 96–112)
Sodium: 137 mEq/L (ref 135–145)

## 2012-11-09 LAB — LIPID PANEL: Total CHOL/HDL Ratio: 6

## 2012-11-09 NOTE — Patient Instructions (Addendum)
You had the flu shot today, as well as the tetanus, and pneumonia shots Please remember to followup with your GYN for the yearly pap smear and/or mammogram, later this month as you have planned You are otherwise up to date with prevention Please continue your efforts at being more active, low cholesterol diet, and weight control. Please have the pharmacy call with any other refills you may need. Please go to LAB in the Basement for the blood and/or urine tests to be done today You will be contacted by phone if any changes need to be made immediately.  Otherwise, you will receive a letter about your results with an explanation Please remember to sign up for My Chart at your earliest convenience, as this will be important to you in the future with finding out test results. Please return in 1 year for your yearly visit, or sooner if needed, with Lab testing done 3-5 days before

## 2012-11-09 NOTE — Assessment & Plan Note (Signed)
stable overall by hx and exam, most recent data reviewed with pt, and pt to continue medical treatment as before Lab Results  Component Value Date   HGBA1C 5.8 02/14/2011

## 2012-11-09 NOTE — Progress Notes (Signed)
Subjective:    Patient ID: Alexandra Wolfe, female    DOB: 1946-09-02, 66 y.o.   MRN: [id number]  HPI  Here for wellness and f/u;  Overall doing ok;  Pt denies CP, worsening SOB, DOE, wheezing, orthopnea, PND, worsening LE edema, palpitations, dizziness or syncope.  Pt denies neurological change such as new Headache, facial or extremity weakness.  Pt denies polydipsia, polyuria, or low sugar symptoms. Pt states overall good compliance with treatment and medications, good tolerability, and trying to follow lower cholesterol diet.  Pt denies worsening depressive symptoms, suicidal ideation or panic. No fever, wt loss, night sweats, loss of appetite, or other constitutional symptoms.  Pt states good ability with ADL's, low fall risk, home safety reviewed and adequate, no significant changes in hearing or vision, and occasionally active with exercise.  For flu shot today.  No acute complaints Past Medical History  Diagnosis Date  . HYPERLIPIDEMIA 03/11/2008  . COLONIC POLYPS, HX OF 09/13/2007  . DEPRESSION 09/13/2007  . OSTEOARTHROSIS NOS, LOWER LEG 09/13/2007  . Chronic LBP   . BACK PAIN 09/27/2008  . BUNIONS, BILATERAL 12/23/2007  . CHEST DISCOMFORT, ATYPICAL 11/07/2009  . CONSTIPATION 09/27/2008  . DYSPNEA ON EXERTION 02/06/2010  . Eustachian tube dysfunction 05/20/2011  . FOOT PAIN, RIGHT 09/27/2008  . LBP (low back pain) 05/20/2011  . SHOULDER PAIN, LEFT 02/14/2009  . SYMPTOM, PALPITATIONS 09/13/2007  . URINARY INCONTINENCE 08/23/2009  . Vertigo 05/20/2011  . Leukocytosis 11/20/2011  . SVT (supraventricular tachycardia)   . Diabetes mellitus     diet controlled  . Anxiety   . ALLERGIC RHINITIS 04/02/2009    no per pt   Past Surgical History  Procedure Date  . Ccx   . Lumbar laminectomy   . Cholecystectomy   . Lumbar laminectomy   . Bunionectomy     right  . Shoulder arthroscopy 12/13/2011    Procedure: ARTHROSCOPY SHOULDER;  Surgeon: Verlee Rossetti;  Location: [facility] OR;  Service: Orthopedics;   Laterality: Left;  Left Shoulder ArthroscopyDebridement Limited Tenodesis Open Rotator Cuff Repair Spur Removal Right Shoulder Injection     reports that she has quit smoking. She has never used smokeless tobacco. She reports that she does not drink alcohol or use illicit drugs. family history includes Colon cancer in her mother; Diabetes in her other; Diabetes type II in her father; and Heart disease in her brother and father.  There is no history of Esophageal cancer, and Stomach cancer, and Rectal cancer, . Allergies  Allergen Reactions  . Aripiprazole     REACTION: agitation  . Simvastatin     REACTION: myalgia   Current Outpatient Prescriptions on File Prior to Visit  Medication Sig Dispense Refill  . albuterol (PROAIR HFA) 108 (90 BASE) MCG/ACT inhaler Inhale 2 puffs into the lungs every 6 (six) hours as needed. For shortness of breath  1 Inhaler  11  . aspirin 81 MG tablet Take 81 mg by mouth daily.        [name] atenolol (TENORMIN) 25 MG tablet TAKE 1 TABLET BY MOUTH EVERY DAY  90 tablet  3  . Cholecalciferol (VITAMIN D) 2000 UNITS tablet Take 2,000 Units by mouth daily.        [name] desvenlafaxine (PRISTIQ) 100 MG 24 hr tablet Take 50 mg by mouth daily.       . fluticasone (FLONASE) 50 MCG/ACT nasal spray Place 2 sprays into the nose daily as needed. For allergies      . lamoTRIgine (LAMICTAL) 150  MG tablet Take 150 mg by mouth daily.         Review of Systems Review of Systems  Constitutional: Negative for diaphoresis, activity change, appetite change and unexpected weight change.  HENT: Negative for hearing loss, ear pain, facial swelling, mouth sores and neck stiffness.   Eyes: Negative for pain, redness and visual disturbance.  Respiratory: Negative for shortness of breath and wheezing.   Cardiovascular: Negative for chest pain and palpitations.  Gastrointestinal: Negative for diarrhea, blood in stool, abdominal distention and rectal pain.  Genitourinary: Negative for hematuria,  flank pain and decreased urine volume.  Musculoskeletal: Negative for myalgias and joint swelling.  Skin: Negative for color change and wound.  Neurological: Negative for syncope and numbness.  Hematological: Negative for adenopathy.  Psychiatric/Behavioral: Negative for hallucinations, self-injury, decreased concentration and agitation.      Objective:   Physical Exam BP 132/70  Pulse 79  Temp 97 F (36.1 C) (Oral)  Ht 5' 6.5" (1.689 m)  Wt 207 lb (93.895 kg)  BMI 32.91 kg/m2  SpO2 96% Physical Exam  VS noted Constitutional: Pt is oriented to person, place, and time. Appears well-developed and well-nourished.  HENT:  Head: Normocephalic and atraumatic.  Right Ear: External ear normal.  Left Ear: External ear normal.  Nose: Nose normal.  Mouth/Throat: Oropharynx is clear and moist.  Eyes: Conjunctivae and EOM are normal. Pupils are equal, round, and reactive to light.  Neck: Normal range of motion. Neck supple. No JVD present. No tracheal deviation present.  Cardiovascular: Normal rate, regular rhythm, normal heart sounds and intact distal pulses.   Pulmonary/Chest: Effort normal and breath sounds normal.  Abdominal: Soft. Bowel sounds are normal. There is no tenderness.  Musculoskeletal: Normal range of motion. Exhibits no edema.  Lymphadenopathy:  Has no cervical adenopathy.  Neurological: Pt is alert and oriented to person, place, and time. Pt has normal reflexes. No cranial nerve deficit.  Skin: Skin is warm and dry. No rash noted.  Psychiatric:  Has  normal mood and affect. Behavior is normal. except 1+ nervous    Assessment & Plan:

## 2012-11-09 NOTE — Assessment & Plan Note (Signed)

## 2012-11-10 ENCOUNTER — Ambulatory Visit: Payer: Medicare Other

## 2012-11-10 ENCOUNTER — Encounter: Payer: Self-pay | Admitting: Internal Medicine

## 2012-11-10 ENCOUNTER — Other Ambulatory Visit: Payer: Self-pay | Admitting: Internal Medicine

## 2012-11-10 DIAGNOSIS — R7309 Other abnormal glucose: Secondary | ICD-10-CM

## 2012-11-10 DIAGNOSIS — D72829 Elevated white blood cell count, unspecified: Secondary | ICD-10-CM

## 2012-11-10 LAB — HEMOGLOBIN A1C: Hgb A1c MFr Bld: 5.7 % (ref 4.6–6.5)

## 2012-11-10 MED ORDER — ATORVASTATIN CALCIUM 20 MG PO TABS: 20.0000 mg | ORAL_TABLET | Freq: Every day | ORAL | Status: DC

## 2012-11-11 ENCOUNTER — Telehealth: Payer: Self-pay | Admitting: Oncology

## 2012-11-11 NOTE — Telephone Encounter (Signed)
LVOM for pt to return call.  °

## 2012-11-13 ENCOUNTER — Telehealth: Payer: Self-pay | Admitting: Oncology

## 2012-11-13 NOTE — Telephone Encounter (Signed)
S/W pt in re NP appt 01/3 @ 1 w/Dr. Welton Flakes Referring Dr. Oliver Barre Dx-Leukopcytosis.  Welcome packet mailed.

## 2012-11-16 ENCOUNTER — Telehealth: Payer: Self-pay | Admitting: Oncology

## 2012-11-16 NOTE — Telephone Encounter (Signed)
C/D 11/16/12 for ppt 12/11/12

## 2012-11-25 ENCOUNTER — Encounter: Payer: Self-pay | Admitting: Women's Health

## 2012-11-27 ENCOUNTER — Encounter: Payer: Self-pay | Admitting: Women's Health

## 2012-12-10 ENCOUNTER — Other Ambulatory Visit: Payer: Self-pay | Admitting: Medical Oncology

## 2012-12-10 DIAGNOSIS — D72829 Elevated white blood cell count, unspecified: Secondary | ICD-10-CM

## 2012-12-10 DIAGNOSIS — E785 Hyperlipidemia, unspecified: Secondary | ICD-10-CM

## 2012-12-11 ENCOUNTER — Ambulatory Visit (HOSPITAL_BASED_OUTPATIENT_CLINIC_OR_DEPARTMENT_OTHER): Payer: Medicare Other

## 2012-12-11 ENCOUNTER — Ambulatory Visit: Payer: Medicare Other | Admitting: Lab

## 2012-12-11 ENCOUNTER — Telehealth: Payer: Self-pay | Admitting: Oncology

## 2012-12-11 ENCOUNTER — Other Ambulatory Visit (HOSPITAL_BASED_OUTPATIENT_CLINIC_OR_DEPARTMENT_OTHER): Payer: Medicare Other | Admitting: Lab

## 2012-12-11 ENCOUNTER — Ambulatory Visit (HOSPITAL_BASED_OUTPATIENT_CLINIC_OR_DEPARTMENT_OTHER): Payer: Medicare Other | Admitting: Oncology

## 2012-12-11 ENCOUNTER — Encounter: Payer: Self-pay | Admitting: Oncology

## 2012-12-11 ENCOUNTER — Other Ambulatory Visit: Payer: Self-pay | Admitting: Medical Oncology

## 2012-12-11 VITALS — BP 143/76 | HR 72 | Temp 97.9°F | Resp 20 | Ht 67.0 in | Wt 207.4 lb

## 2012-12-11 DIAGNOSIS — D72829 Elevated white blood cell count, unspecified: Secondary | ICD-10-CM

## 2012-12-11 DIAGNOSIS — E785 Hyperlipidemia, unspecified: Secondary | ICD-10-CM

## 2012-12-11 LAB — CBC WITH DIFFERENTIAL/PLATELET
BASO%: 0.5 % (ref 0.0–2.0)
Basophils Absolute: 0.1 10*3/uL (ref 0.0–0.1)
EOS%: 0.9 % (ref 0.0–7.0)
HGB: 15 g/dL (ref 11.6–15.9)
MCH: 30.8 pg (ref 25.1–34.0)
MCHC: 33.6 g/dL (ref 31.5–36.0)
MONO%: 7.9 % (ref 0.0–14.0)
RBC: 4.86 10*6/uL (ref 3.70–5.45)
RDW: 13.2 % (ref 11.2–14.5)
lymph#: 3.5 10*3/uL — ABNORMAL HIGH (ref 0.9–3.3)

## 2012-12-11 LAB — COMPREHENSIVE METABOLIC PANEL (CC13)
ALT: 13 U/L (ref 0–55)
AST: 17 U/L (ref 5–34)
Alkaline Phosphatase: 97 U/L (ref 40–150)
CO2: 25 mEq/L (ref 22–29)
Creatinine: 0.9 mg/dL (ref 0.6–1.1)
Total Bilirubin: 0.45 mg/dL (ref 0.20–1.20)

## 2012-12-11 NOTE — Telephone Encounter (Signed)
Pt was sent back to lb today and per tech this lab cannot be drawn on Fridays -pt needs to come mon - thurs before 2pm. Per pt scheduled appt for 1/9 @ 12:45pm. Pt aware of new d/t.

## 2012-12-11 NOTE — Telephone Encounter (Signed)
Pt sent back to lab and given appt schedule for April. Lb/fu scheduled for wk of 4/25 due to KK out wk of 4/17.

## 2012-12-11 NOTE — Patient Instructions (Addendum)
You most likely have ractive leukocytosis  Leukocytosis Leukocytosis means you have more white blood cells than normal. White blood cells are made in your bone marrow. The main job of white blood cells is to fight infection. Having too many white blood cells is a common condition. It can develop as a result of many types of medical problems. CAUSES  In some cases, your bone marrow may be normal, but it is still making too many white blood cells. This could be the result of:  Infection.  Injury.  Physical stress.  Emotional stress.  Surgery.  Allergic reactions.  Tumors that do not start in the blood or bone marrow.  An inherited disease.  Certain medicines.  Pregnancy and labor. In other cases, you may have a bone marrow disorder that is causing your body to make too many white blood cells. Bone marrow disorders include:  Leukemia. This is a type of blood cancer.  Myeloproliferative disorders. These disorders cause blood cells to grow abnormally. SYMPTOMS  Some people have no symptoms. Others have symptoms due to the medical problem that is causing their leukocytosis. These symptoms may include:  Bleeding.  Bruising.  Fever.  Night sweats.  Repeated infections.  Weakness.  Weight loss. DIAGNOSIS  Leukocytosis is often found during blood tests that are done as part of a normal physical exam. Your caregiver will probably order other tests to help determine why you have too many white blood cells. These tests may include:  A complete blood count (CBC). This test measures all the types of blood cells in your body.  Chest X-rays, urine tests (urinalysis), or other tests to look for signs of infection.  Bone marrow aspiration. For this test, a needle is put into your bone. Cells from the bone marrow are removed through the needle. The cells are then examined under a microscope. TREATMENT  Treatment is usually not needed for leukocytosis. However, if a disorder is  causing your leukocytosis, it will need to be treated. Treatment may include:  Antibiotic medicines if you have a bacterial infection.  Bone marrow transplant. Your diseased bone marrow is replaced with healthy cells that will grow new bone marrow.  Chemotherapy. This is the use of drugs to kill cancer cells. HOME CARE INSTRUCTIONS  Only take over-the-counter or prescription medicines as directed by your caregiver.  Maintain a healthy weight. Ask your caregiver what weight is best for you.  Eat foods that are low in saturated fats and high in fiber. Eat plenty of fruits and vegetables.  Drink enough fluids to keep your urine clear or pale yellow.  Get 30 minutes of exercise at least 5 times a week. Check with your caregiver before starting a new exercise routine.  Limit caffeine and alcohol.  Do not smoke.  Keep all follow-up appointments as directed by your caregiver. SEEK MEDICAL CARE IF:  You feel weak or more tired than usual.  You develop chills, a cough, or nasal congestion.  You lose weight without trying.  You have night sweats.  You bruise easily. SEEK IMMEDIATE MEDICAL CARE IF:  You bleed more than normal.  You have chest pain.  You have trouble breathing.  You have a fever.  You have uncontrolled nausea or vomiting.  You feel dizzy or lightheaded. MAKE SURE YOU:  Understand these instructions.  Will watch your condition.  Will get help right away if you are not doing well or get worse. Document Released: 11/14/2011 Document Revised: 02/17/2012 Document Reviewed: 11/14/2011 ExitCare Patient  Information 2013 [place], [place].

## 2012-12-16 ENCOUNTER — Other Ambulatory Visit: Payer: Medicare Other

## 2012-12-17 ENCOUNTER — Other Ambulatory Visit: Payer: Self-pay | Admitting: Emergency Medicine

## 2012-12-17 ENCOUNTER — Other Ambulatory Visit: Payer: Medicare Other

## 2012-12-17 DIAGNOSIS — D72829 Elevated white blood cell count, unspecified: Secondary | ICD-10-CM

## 2012-12-27 NOTE — Progress Notes (Signed)
[facility]  Telephone:([phone] Fax:([phone]  INITIAL HEMATOLOGY CONSULTATION  Referral MD:   Dr. Oliver Barre  Reason for Referral: 67 year old female with leukocytosis being seen in hematology for further evaluation and management.  HPI: Patient is a very pleasant 67 year old female with medical history significant for hyperlipidemia osteoarthrosis anxiety allergic rhinitis. Patient has been followed by Dr. Oliver Barre with her primary care physician. She recently was seen by him for a complete physical examination. A CBC was performed and it revealed that her white cell count was elevated to 13.3. Upon review of her other labs 2 years ago her white count was slightly elevated at 10.61 year ago 11.212 months ago 12.411 months ago 12.5 and then on 11/09/2012 it was 13.3. Her hemoglobin has been up and down as well with hemoglobins ranging anywhere between 12.6 and 15.3. Platelet count has been normal with the range between 301,000-374,000. Patient does have history of allergies. And recently she was suffering from it. She occasionally does take medications for this.She denies having had any easy bruising. She has no fevers chills night sweats. She does have history of SVT she also has diet-controlled diabetes she does have significant anxiety. 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 She does have history of depression. Osteoarthritis chronic back pain she does have some shortness of breath especially on exertion. She denies any abdominal pain no nausea no vomiting no hematuria hematochezia melena no dysuria or frequency. She has not noticed any dental infections or gum disease. Remainder of the 14 point review of systems is negative.    Past Medical History  Diagnosis Date  . HYPERLIPIDEMIA 03/11/2008  . COLONIC POLYPS, HX OF 09/13/2007  . DEPRESSION 09/13/2007  . OSTEOARTHROSIS NOS, LOWER LEG 09/13/2007  . Chronic LBP     . BACK PAIN 09/27/2008  . BUNIONS, BILATERAL 12/23/2007  . CHEST DISCOMFORT, ATYPICAL 11/07/2009  . CONSTIPATION 09/27/2008  . DYSPNEA ON EXERTION 02/06/2010  . Eustachian tube dysfunction 05/20/2011  . FOOT PAIN, RIGHT 09/27/2008  . LBP (low back pain) 05/20/2011  . SHOULDER PAIN, LEFT 02/14/2009  . SYMPTOM, PALPITATIONS 09/13/2007  . URINARY INCONTINENCE 08/23/2009  . Vertigo 05/20/2011  . Leukocytosis 11/20/2011  . SVT (supraventricular tachycardia)   . Diabetes mellitus     diet controlled  . Anxiety   . ALLERGIC RHINITIS 04/02/2009    no per pt  :    Past Surgical History  Procedure Date  . Ccx   . Lumbar laminectomy   . Cholecystectomy   . Lumbar laminectomy   . Bunionectomy     right  . Shoulder arthroscopy 12/13/2011    Procedure: ARTHROSCOPY SHOULDER;  Surgeon: Verlee Rossetti;  Location: [facility] OR;  Service: Orthopedics;  Laterality: Left;  Left Shoulder ArthroscopyDebridement Limited Tenodesis Open Rotator Cuff Repair Spur Removal Right Shoulder Injection   :   CURRENT MEDS: Current Outpatient Prescriptions  Medication Sig Dispense Refill  . albuterol (PROAIR HFA) 108 (90 BASE) MCG/ACT inhaler Inhale 2 puffs into the lungs every 6 (six) hours as needed. For shortness of breath  1 Inhaler  11  . aspirin 81 MG tablet Take 81 mg by mouth daily.        [name] atenolol (TENORMIN) 25 MG tablet TAKE 1 TABLET BY MOUTH EVERY DAY  90 tablet  3  . atorvastatin (LIPITOR) 20 MG tablet Take 1 tablet (20 mg total) by mouth daily.  90 tablet  3  . desvenlafaxine (PRISTIQ) 100 MG 24 hr tablet Take 50 mg by mouth daily.       . fluticasone (FLONASE) 50 MCG/ACT nasal spray Place 2 sprays into the nose daily as needed. For allergies      . lamoTRIgine (LAMICTAL) 150 MG tablet Take 150 mg by mouth daily.        . Cholecalciferol (VITAMIN D) 2000 UNITS tablet Take 2,000 Units by mouth daily.            Allergies  Allergen Reactions  . Aripiprazole     REACTION: agitation  . Simvastatin      REACTION: myalgia  :  Family History  Problem Relation Age of Onset  . Colon cancer Mother   . Heart disease Father   . Diabetes type II Father   . Heart disease Brother   . Diabetes Other     father  . Esophageal cancer Neg Hx   . Stomach cancer Neg Hx   . Rectal cancer Neg Hx   :  History   Social History  . Marital Status: Married    Spouse Name: N/A    Number of Children: 2  . Years of Education: N/A   Occupational History  . Disabled Psych.    Social History Main Topics  . Smoking status: Former Games developer  . Smokeless tobacco: Never Used     Comment: Smoked as a teenager. Quit in 1970  . Alcohol Use: No  . Drug Use: No  . Sexually Active: Not on file   Other Topics Concern  . Not on file   Social History Narrative   Husband chronically ill in nursing home.Disabled - psychiatric  :  REVIEW OF SYSTEM:  The rest of the 14-point review of sytem was negative.   Exam: Filed Vitals:   12/11/12 1345  BP: 143/76  Pulse: 72  Temp: 97.9 F (36.6 C)  Resp: 20  Height: 5\' 7"  (1.702 m)  Weight: 207 lb 6.4 oz (94.076 kg)     General:  well-nourished in no acute distress.  Eyes:  no scleral icterus.  ENT:  There were no oropharyngeal lesions.  Neck was without thyromegaly.  Lymphatics:  Negative cervical, supraclavicular or axillary adenopathy.  Respiratory: lungs were clear bilaterally without wheezing or crackles.  Cardiovascular:  Regular rate and rhythm, S1/S2, without murmur, rub or gallop.  There was no pedal edema.  GI:  abdomen was soft, flat, nontender, nondistended, without organomegaly.  Muscoloskeletal:  no spinal tenderness of palpation of vertebral spine.  Skin exam was without echymosis, petichae.  Neuro exam was nonfocal.  Patient was able to get on and off exam table without assistance.  Gait was normal.  Patient was alerted and oriented.  Attention was good.   Language was appropriate.  Mood was normal without depression.  Speech was not pressured.   Thought content was not tangential.    LABS:  Lab Results  Component Value Date   WBC 12.6* 12/11/2012   HGB 15.0 12/11/2012   HCT 44.5 12/11/2012   PLT 346 12/11/2012   GLUCOSE 74 12/11/2012   CHOL 277* 11/09/2012   TRIG 199.0* 11/09/2012   HDL 44.10 11/09/2012   LDLDIRECT 212.5 11/09/2012   LDLCALC 112* 02/14/2011   ALT 13 12/11/2012   AST 17 12/11/2012   NA 147* 12/11/2012   K 4.9 12/11/2012   CL 104 12/11/2012   CREATININE 0.9 12/11/2012   BUN 16.0 12/11/2012   CO2 25 12/11/2012  INR 1.0 ratio 11/13/2009   HGBA1C 5.7 11/10/2012         ASSESSMENT AND PLAN: 67 year old female with multiple medical problems and chronic leukocytosis. Her white count has ranged anywhere between 10.9 to13 most recently in December 2013. The differential does include reactive leukocytosis most likely. However other etiologies such as a primary bone marrow problem does need to be ruled out these would include CLL or CML although I do think that this is very low on the differential. Also drug-induced leukocytosis I have also reviewed her medications and I do not think that she is on anything that would cause this. My #1 differential would be just reactive leukocytosis since she does have a history of chronic allergic rhinitis and she was having some symptoms. We did do a CBC today and her white count is down to 12.6. And this would fit with a chronic benign reactive leukocytosis. She herself is asymptomatic currently. I did recommend that we go ahead and get a leukocyte alkaline phosphatase as well as sendblood for testing for CML for BCR/ABLTo make sure that she doesn't have an underlying cause.  I will plan on seeing her back in 2-3 months time for followup at which time we will do a CBC.  The length of time of the face-to-face encounter was 60    minutes. More than 50% of time was spent counseling and coordination of care.  Drue Second, MD Medical/Oncology [facility] [phone] (beeper) [phone]  (Office)

## 2013-01-18 ENCOUNTER — Encounter: Payer: Self-pay | Admitting: Women's Health

## 2013-01-18 ENCOUNTER — Ambulatory Visit (INDEPENDENT_AMBULATORY_CARE_PROVIDER_SITE_OTHER): Payer: Medicare Other | Admitting: Women's Health

## 2013-01-18 VITALS — BP 106/66 | Ht 66.5 in | Wt 192.0 lb

## 2013-01-18 DIAGNOSIS — N952 Postmenopausal atrophic vaginitis: Secondary | ICD-10-CM

## 2013-01-18 NOTE — Progress Notes (Signed)
Alexandra Wolfe [birth year] [id number]    History:    The patient presents for breast and pelvic exam.  Hysterectomy/no ERT since 2008.bladder suspension x2. Normal Pap and mammogram history. Long-term problem of anxiety/depression psychiatrist manages medications. Normal colonoscopy April 2013. DEXA normal 12/2012 FRAX 0.1%/5.8%.hypothyroid primary care manages. Vaccines current.   Past medical history, past surgical history, family history and social history were all reviewed and documented in the EPIC chart. Husband in a nursing home with dementia, stroke, unable to walk, talk. 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   Exam:  Filed Vitals:   01/18/13 1516  BP: 106/66    General appearance:  Normal Head/Neck:  Normal, without cervical or supraclavicular adenopathy. Thyroid:  Symmetrical, normal in size, without palpable masses or nodularity. Respiratory  Effort:  Normal  Auscultation:  Clear without wheezing or rhonchi Cardiovascular  Auscultation:  Regular rate, without rubs, murmurs or gallops  Edema/varicosities:  Not grossly evident Abdominal  Soft,nontender, without masses, guarding or rebound.  Liver/spleen:  No organomegaly noted  Hernia:  None appreciated  Skin  Inspection:  Grossly normal  Palpation:  Grossly normal Neurologic/psychiatric  Orientation:  Normal with appropriate conversation.  Mood/affect:  Normal  Genitourinary    Breasts: Examined lying and sitting.     Right: Without masses, retractions, discharge or axillary adenopathy.     Left: Without masses, retractions, discharge or axillary adenopathy.   Inguinal/mons:  Normal without inguinal adenopathy  External genitalia:  Normal  BUS/Urethra/Skene's glands:  Normal  Bladder:  Normal  Vagina:  atrophic  Cervix:  absentl  Uterus:  absent  Adnexa/parametria:     Rt: Without masses or tenderness.   Lt: Without masses or tenderness.  Anus and  perineum: Normal  Digital rectal exam: Normal sphincter tone without palpated masses or tenderness  Assessment/Plan:  67 y.o. [name] G2 P2 for breast and pelvic exam.  Hysterectomy/not sexually active/no HRT. Normal DEXA 12/2012 Negative colonoscopy 03/2012 Hypothyroid-primary care manages labs and meds Anxiety/depression-psychiatrist manages meds.  Plan: Continue SBE's, annual mammogram, calcium rich diet, vitamin D 2000 daily encouraged. Home safety and fall prevention discussed. Encouraged increased leisure activities, visits husband in nursing home daily.        Har[facility]all[facility], 5:14 PM 01/18/2013

## 2013-01-18 NOTE — Patient Instructions (Addendum)

## 2013-02-05 ENCOUNTER — Encounter: Payer: Self-pay | Admitting: Gynecology

## 2013-02-24 ENCOUNTER — Other Ambulatory Visit: Payer: Self-pay | Admitting: Internal Medicine

## 2013-04-02 ENCOUNTER — Other Ambulatory Visit: Payer: Medicare Other | Admitting: Lab

## 2013-04-02 ENCOUNTER — Ambulatory Visit: Payer: Medicare Other | Admitting: Oncology

## 2013-06-29 ENCOUNTER — Encounter: Payer: Self-pay | Admitting: Internal Medicine

## 2013-06-29 ENCOUNTER — Ambulatory Visit (INDEPENDENT_AMBULATORY_CARE_PROVIDER_SITE_OTHER): Payer: Medicare Other | Admitting: Internal Medicine

## 2013-06-29 ENCOUNTER — Other Ambulatory Visit (INDEPENDENT_AMBULATORY_CARE_PROVIDER_SITE_OTHER): Payer: Medicare Other

## 2013-06-29 VITALS — BP 140/92 | HR 66 | Temp 98.2°F | Wt 228.0 lb

## 2013-06-29 DIAGNOSIS — R112 Nausea with vomiting, unspecified: Secondary | ICD-10-CM

## 2013-06-29 DIAGNOSIS — R635 Abnormal weight gain: Secondary | ICD-10-CM

## 2013-06-29 DIAGNOSIS — J309 Allergic rhinitis, unspecified: Secondary | ICD-10-CM

## 2013-06-29 DIAGNOSIS — R42 Dizziness and giddiness: Secondary | ICD-10-CM

## 2013-06-29 LAB — TSH: TSH: 4.18 u[IU]/mL (ref 0.35–5.50)

## 2013-06-29 MED ORDER — CETIRIZINE HCL 10 MG PO TABS: 10.0000 mg | ORAL_TABLET | Freq: Every day | ORAL | Status: DC

## 2013-06-29 MED ORDER — MECLIZINE HCL 32 MG PO TABS: 32.0000 mg | ORAL_TABLET | Freq: Three times a day (TID) | ORAL | Status: DC | PRN

## 2013-06-29 MED ORDER — ONDANSETRON HCL 4 MG PO TABS: 4.0000 mg | ORAL_TABLET | Freq: Three times a day (TID) | ORAL | Status: DC | PRN

## 2013-06-29 NOTE — Patient Instructions (Signed)
Vertigo Vertigo means you feel like you or your surroundings are moving when they are not. Vertigo can be dangerous if it occurs when you are at work, driving, or performing difficult activities.  CAUSES  Vertigo occurs when there is a conflict of signals sent to your brain from the visual and sensory systems in your body. There are many different causes of vertigo, including:  Infections, especially in the inner ear.  A bad reaction to a drug or misuse of alcohol and medicines.  Withdrawal from drugs or alcohol.  Rapidly changing positions, such as lying down or rolling over in bed.  A migraine headache.  Decreased blood flow to the brain.  Increased pressure in the brain from a head injury, infection, tumor, or bleeding. SYMPTOMS  You may feel as though the world is spinning around or you are falling to the ground. Because your balance is upset, vertigo can cause nausea and vomiting. You may have involuntary eye movements (nystagmus). DIAGNOSIS  Vertigo is usually diagnosed by physical exam. If the cause of your vertigo is unknown, your caregiver may perform imaging tests, such as an MRI scan (magnetic resonance imaging). TREATMENT  Most cases of vertigo resolve on their own, without treatment. Depending on the cause, your caregiver may prescribe certain medicines. If your vertigo is related to body position issues, your caregiver may recommend movements or procedures to correct the problem. In rare cases, if your vertigo is caused by certain inner ear problems, you may need surgery. HOME CARE INSTRUCTIONS   Follow your caregiver's instructions.  Avoid driving.  Avoid operating heavy machinery.  Avoid performing any tasks that would be dangerous to you or others during a vertigo episode.  Tell your caregiver if you notice that certain medicines seem to be causing your vertigo. Some of the medicines used to treat vertigo episodes can actually make them worse in some people. SEEK  IMMEDIATE MEDICAL CARE IF:   Your medicines do not relieve your vertigo or are making it worse.  You develop problems with talking, walking, weakness, or using your arms, hands, or legs.  You develop severe headaches.  Your nausea or vomiting continues or gets worse.  You develop visual changes.  A family member notices behavioral changes.  Your condition gets worse. MAKE SURE YOU:  Understand these instructions.  Will watch your condition.  Will get help right away if you are not doing well or get worse. Document Released: 09/04/2005 Document Revised: 02/17/2012 Document Reviewed: 06/13/2011 Exit[employer] Patient Information 2014 [employer].  

## 2013-06-29 NOTE — Progress Notes (Signed)
Subjective:    Patient ID: Alexandra Wolfe, female    DOB: 02-03-1946, 67 y.o.   MRN: [id number]  HPI  Pt presents to the clinic today with c/o vertigo. This started yesterday. There is some associated nausea and vomiting. She is taking Meclizine but the RX is 68 years old. The room feels like it is spinning. She has had vertigo in the past. She denies chest pain, chest tightness or shortness of breath. She also c/o watery eyes, runny nose and itchy ears. This has been going on for the last month. She is not taking anything OTC for this. She has not had sick contacts. Additionally, she c/o weight gain. She has gained 30 lb in the last 5 months. She does have a family history of hypothyroidism. She has not changed her diet that he is aware of. She is not exercising as much.  Review of Systems      Past Medical History  Diagnosis Date  . HYPERLIPIDEMIA 03/11/2008  . COLONIC POLYPS, HX OF 09/13/2007  . DEPRESSION 09/13/2007  . OSTEOARTHROSIS NOS, LOWER LEG 09/13/2007  . Chronic LBP   . BACK PAIN 09/27/2008  . BUNIONS, BILATERAL 12/23/2007  . CHEST DISCOMFORT, ATYPICAL 11/07/2009  . CONSTIPATION 09/27/2008  . DYSPNEA ON EXERTION 02/06/2010  . Eustachian tube dysfunction 05/20/2011  . FOOT PAIN, RIGHT 09/27/2008  . LBP (low back pain) 05/20/2011  . SHOULDER PAIN, LEFT 02/14/2009  . SYMPTOM, PALPITATIONS 09/13/2007  . URINARY INCONTINENCE 08/23/2009  . Vertigo 05/20/2011  . Leukocytosis 11/20/2011  . SVT (supraventricular tachycardia)   . Diabetes mellitus     diet controlled  . Anxiety   . ALLERGIC RHINITIS 04/02/2009    no per pt    Current Outpatient Prescriptions  Medication Sig Dispense Refill  . albuterol (PROAIR HFA) 108 (90 BASE) MCG/ACT inhaler Inhale 2 puffs into the lungs every 6 (six) hours as needed. For shortness of breath  1 Inhaler  11  . aspirin 81 MG tablet Take 81 mg by mouth daily.        [name] atenolol (TENORMIN) 25 MG tablet TAKE 1 TABLET BY MOUTH EVERY DAY  90 tablet  3  .  atorvastatin (LIPITOR) 20 MG tablet Take 1 tablet (20 mg total) by mouth daily.  90 tablet  3  . Cholecalciferol (VITAMIN D) 2000 UNITS tablet Take 2,000 Units by mouth daily.        [name] desvenlafaxine (PRISTIQ) 100 MG 24 hr tablet Take 50 mg by mouth daily.       . fluticasone (FLONASE) 50 MCG/ACT nasal spray Place 2 sprays into the nose daily as needed. For allergies      . lamoTRIgine (LAMICTAL) 150 MG tablet Take 150 mg by mouth daily.         No current facility-administered medications for this visit.    Allergies  Allergen Reactions  . Aripiprazole     REACTION: agitation  . Simvastatin     REACTION: myalgia    Family History  Problem Relation Age of Onset  . Colon cancer Mother   . Heart disease Father   . Diabetes type II Father   . Heart disease Brother   . Diabetes Other     father  . Esophageal cancer Neg Hx   . Stomach cancer Neg Hx   . Rectal cancer Neg Hx     History   Social History  . Marital Status: Married    Spouse Name: N/A    Number of  Children: 2  . Years of Education: N/A   Occupational History  . Disabled Psych.    Social History Main Topics  . Smoking status: Former Games developer  . Smokeless tobacco: Never Used     Comment: Smoked as a teenager. Quit in 1970  . Alcohol Use: No  . Drug Use: No  . Sexually Active: No   Other Topics Concern  . Not on file   Social History Narrative   Husband chronically ill in nursing home.      Disabled - psychiatric     Constitutional: Pt reoports weight gain. Denies fever, malaise, fatigue, headache.  HEENT: Pt reports watery eyes, runny nose and itchy ears. Denies eye pain, eye redness, ear pain, ringing in the ears, wax buildup, nasal congestion, bloody nose, or sore throat. Respiratory: Denies difficulty breathing, shortness of breath, cough or sputum production.   Cardiovascular: Denies chest pain, chest tightness, palpitations or swelling in the hands or feet.  GI: Pt reports nausea and vomiting.  Denies diarrhea, constipation or blood in her stool. Neurological: Pt reports dizziness. Denies  difficulty with memory, difficulty with speech or problems with balance and coordination.   No other specific complaints in a complete review of systems (except as listed in HPI above).  Objective:   Physical Exam   BP 140/92  Pulse 66  Temp(Src) 98.2 F (36.8 C) (Oral)  Wt 228 lb (103.42 kg)  BMI 36.25 kg/m2  SpO2 97% Wt Readings from Last 3 Encounters:  06/29/13 228 lb (103.42 kg)  01/18/13 192 lb (87.091 kg)  12/11/12 207 lb 6.4 oz (94.076 kg)    General: Appears her stated age, obese but well developed, well nourished in NAD. Skin: Warm, dry and intact. No rashes, lesions or ulcerations noted. HEENT: Head: normal shape and size; Eyes: sclera injected, no icterus, conjunctiva pink, PERRLA and EOMs intact; Ears: Tm's gray and intact, normal light reflex; Nose: mucosa pink and moist, septum midline; Throat/Mouth: Teeth present, mucosa pink and moist, + PND, no exudate, lesions or ulcerations noted.  Cardiovascular: Normal rate and rhythm. S1,S2 noted.  No murmur, rubs or gallops noted. No JVD or BLE edema. No carotid bruits noted. Pulmonary/Chest: Normal effort and positive vesicular breath sounds. No respiratory distress. No wheezes, rales or ronchi noted.  Abdomen: Soft and nontender. Normal bowel sounds, no bruits noted. No distention or masses noted. Liver, spleen and kidneys non palpable. Neurological: Alert and oriented. Cranial nerves II-XII intact. Coordination normal. +DTRs bilaterally.   BMET    Component Value Date/Time   NA 147* 12/11/2012 1336   NA 137 11/09/2012 1024   K 4.9 12/11/2012 1336   K 4.1 11/09/2012 1024   CL 104 12/11/2012 1336   CL 105 11/09/2012 1024   CO2 25 12/11/2012 1336   CO2 20 11/09/2012 1024   GLUCOSE 74 12/11/2012 1336   GLUCOSE 120* 11/09/2012 1024   BUN 16.0 12/11/2012 1336   BUN 12 11/09/2012 1024   CREATININE 0.9 12/11/2012 1336   CREATININE 0.7  11/09/2012 1024   CALCIUM 10.1 12/11/2012 1336   CALCIUM 9.3 11/09/2012 1024   GFRNONAA 60* 12/14/2011 0721   GFRAA 70* 12/14/2011 0721    Lipid Panel     Component Value Date/Time   CHOL 277* 11/09/2012 1024   TRIG 199.0* 11/09/2012 1024   HDL 44.10 11/09/2012 1024   CHOLHDL 6 11/09/2012 1024   VLDL 39.8 11/09/2012 1024   LDLCALC 112* 02/14/2011 1141    CBC    Component Value  Date/Time   WBC 12.6* 12/11/2012 1336   WBC 13.3* 11/09/2012 1024   RBC 4.86 12/11/2012 1336   RBC 4.99 11/09/2012 1024   HGB 15.0 12/11/2012 1336   HGB 15.3* 11/09/2012 1024   HCT 44.5 12/11/2012 1336   HCT 45.3 11/09/2012 1024   PLT 346 12/11/2012 1336   PLT 374.0 11/09/2012 1024   MCV 91.5 12/11/2012 1336   MCV 90.8 11/09/2012 1024   MCH 30.8 12/11/2012 1336   MCH 31.3 12/09/2011 1129   MCHC 33.6 12/11/2012 1336   MCHC 33.8 11/09/2012 1024   RDW 13.2 12/11/2012 1336   RDW 12.7 11/09/2012 1024   LYMPHSABS 3.5* 12/11/2012 1336   LYMPHSABS 4.3* 11/09/2012 1024   MONOABS 1.0* 12/11/2012 1336   MONOABS 0.9 11/09/2012 1024   EOSABS 0.1 12/11/2012 1336   EOSABS 0.3 11/09/2012 1024   BASOSABS 0.1 12/11/2012 1336   BASOSABS 0.1 11/09/2012 1024    Hgb A1C Lab Results  Component Value Date   HGBA1C 5.7 11/10/2012        Assessment & Plan:   Vertigo, likely related to allergic rhinitis, accompanied by nausea and vomiting:  Continue Flonase eRx for Zyrtec eRx for Meclizine eRx for Zofran  Weight gain:  Will check thyroid panel Likely combination of diet and lack of eercise

## 2013-07-21 ENCOUNTER — Telehealth: Payer: Self-pay

## 2013-07-21 ENCOUNTER — Ambulatory Visit (INDEPENDENT_AMBULATORY_CARE_PROVIDER_SITE_OTHER): Payer: Medicare Other | Admitting: Internal Medicine

## 2013-07-21 ENCOUNTER — Encounter: Payer: Self-pay | Admitting: Internal Medicine

## 2013-07-21 VITALS — BP 102/70 | HR 72 | Temp 97.7°F | Ht 66.5 in | Wt 233.4 lb

## 2013-07-21 DIAGNOSIS — R7309 Other abnormal glucose: Secondary | ICD-10-CM

## 2013-07-21 DIAGNOSIS — R7302 Impaired glucose tolerance (oral): Secondary | ICD-10-CM

## 2013-07-21 DIAGNOSIS — Z0000 Encounter for general adult medical examination without abnormal findings: Secondary | ICD-10-CM

## 2013-07-21 DIAGNOSIS — J309 Allergic rhinitis, unspecified: Secondary | ICD-10-CM

## 2013-07-21 DIAGNOSIS — F329 Major depressive disorder, single episode, unspecified: Secondary | ICD-10-CM

## 2013-07-21 MED ORDER — PREDNISONE 10 MG PO TABS: ORAL_TABLET | ORAL | Status: DC

## 2013-07-21 MED ORDER — FLUTICASONE PROPIONATE 50 MCG/ACT NA SUSP: 2.0000 | Freq: Every day | NASAL | Status: DC | PRN

## 2013-07-21 MED ORDER — METHYLPREDNISOLONE ACETATE 80 MG/ML IJ SUSP
80.0000 mg | Freq: Once | INTRAMUSCULAR | Status: AC
  Administered 2013-07-21: 80 mg via INTRAMUSCULAR

## 2013-07-21 NOTE — Assessment & Plan Note (Addendum)
Asympt,  Lab Results  Component Value Date   HGBA1C 5.7 11/10/2012   For f/u with next visit, cont efforts at wt loss, to call for any onset polys with predpack use

## 2013-07-21 NOTE — Assessment & Plan Note (Signed)
stable overall by history and exam, recent data reviewed with pt, and pt to continue medical treatment as before,  to f/u any worsening symptoms or concerns Lab Results  Component Value Date   WBC 12.6* 12/11/2012   HGB 15.0 12/11/2012   HCT 44.5 12/11/2012   PLT 346 12/11/2012   GLUCOSE 74 12/11/2012   CHOL 277* 11/09/2012   TRIG 199.0* 11/09/2012   HDL 44.10 11/09/2012   LDLDIRECT 212.5 11/09/2012   LDLCALC 112* 02/14/2011   ALT 13 12/11/2012   AST 17 12/11/2012   NA 147* 12/11/2012   K 4.9 12/11/2012   CL 104 12/11/2012   CREATININE 0.9 12/11/2012   BUN 16.0 12/11/2012   CO2 25 12/11/2012   TSH 4.18 06/29/2013   INR 1.0 ratio 11/13/2009   HGBA1C 5.7 11/10/2012

## 2013-07-21 NOTE — Telephone Encounter (Signed)
cpx labs entered  

## 2013-07-21 NOTE — Patient Instructions (Signed)
You had the steroid shot today Please take all new medication as prescribed - the prednisone Please continue all other medications as before, and refills have been done if requested - the zyrtec and the flonase Please have the pharmacy call with any other refills you may need. You can also use a low dose sudafed for congestion if needed, and dramamine for dizziness if needed  Please remember to sign up for My Chart if you have not done so, as this will be important to you in the future with finding out test results, communicating by private email, and scheduling acute appointments online when needed.

## 2013-07-21 NOTE — Assessment & Plan Note (Signed)
With significant marked bilat eye involement as well - for depomedrol IM, and predpack asd, cont zyrtec, re-start flonase asd, consider add singulair or allergy referral

## 2013-07-21 NOTE — Progress Notes (Signed)
Subjective:    Patient ID: Alexandra Wolfe, female    DOB: June 06, 1946, 67 y.o.   MRN: [id number]  HPI  Here to f/u, seen per NP recently but insurance would not pay for the zofran or meclizine.  Did take zyrtec but still eye and nasal allergy symptoms much worse with itch, sneeze, congestion clearish yellow, with ears itching as well, mild vertigo, left ear fullness, without fever, pain.  Pt denies chest pain, increased sob or doe, wheezing, orthopnea, PND, increased LE swelling, palpitations, dizziness or syncope.   Out of flonase for many months.  Pt denies polydipsia, polyuria.  Denies worsening depressive symptoms, suicidal ideation, or panic.  Has gained 30 lbs in the past yr with being less active  Past Medical History  Diagnosis Date  . HYPERLIPIDEMIA 03/11/2008  . COLONIC POLYPS, HX OF 09/13/2007  . DEPRESSION 09/13/2007  . OSTEOARTHROSIS NOS, LOWER LEG 09/13/2007  . Chronic LBP   . BACK PAIN 09/27/2008  . BUNIONS, BILATERAL 12/23/2007  . CHEST DISCOMFORT, ATYPICAL 11/07/2009  . CONSTIPATION 09/27/2008  . DYSPNEA ON EXERTION 02/06/2010  . Eustachian tube dysfunction 05/20/2011  . FOOT PAIN, RIGHT 09/27/2008  . LBP (low back pain) 05/20/2011  . SHOULDER PAIN, LEFT 02/14/2009  . SYMPTOM, PALPITATIONS 09/13/2007  . URINARY INCONTINENCE 08/23/2009  . Vertigo 05/20/2011  . Leukocytosis 11/20/2011  . SVT (supraventricular tachycardia)   . Diabetes mellitus     diet controlled  . Anxiety   . ALLERGIC RHINITIS 04/02/2009    no per pt   Past Surgical History  Procedure Laterality Date  . Ccx    . Lumbar laminectomy    . Cholecystectomy    . Lumbar laminectomy    . Bunionectomy      right  . Shoulder arthroscopy  12/13/2011    Procedure: ARTHROSCOPY SHOULDER;  Surgeon: Verlee Rossetti;  Location: [facility] OR;  Service: Orthopedics;  Laterality: Left;  Left Shoulder ArthroscopyDebridement Limited Tenodesis Open Rotator Cuff Repair Spur Removal Right Shoulder Injection     reports that she has quit  smoking. She has never used smokeless tobacco. She reports that she does not drink alcohol or use illicit drugs. family history includes Colon cancer in her mother; Diabetes in her other; Diabetes type II in her father; Heart disease in her brother and father. There is no history of Esophageal cancer, Stomach cancer, or Rectal cancer. Allergies  Allergen Reactions  . Aripiprazole     REACTION: agitation  . Simvastatin     REACTION: myalgia   Current Outpatient Prescriptions on File Prior to Visit  Medication Sig Dispense Refill  . albuterol (PROAIR HFA) 108 (90 BASE) MCG/ACT inhaler Inhale 2 puffs into the lungs every 6 (six) hours as needed. For shortness of breath  1 Inhaler  11  . aspirin 81 MG tablet Take 81 mg by mouth daily.        [name] atenolol (TENORMIN) 25 MG tablet TAKE 1 TABLET BY MOUTH EVERY DAY  90 tablet  3  . atorvastatin (LIPITOR) 20 MG tablet Take 1 tablet (20 mg total) by mouth daily.  90 tablet  3  . cetirizine (ZYRTEC) 10 MG tablet Take 1 tablet (10 mg total) by mouth daily.  30 tablet  11  . Cholecalciferol (VITAMIN D) 2000 UNITS tablet Take 2,000 Units by mouth daily.        . meclizine (ANTIVERT) 32 MG tablet Take 1 tablet (32 mg total) by mouth 3 (three) times daily as needed.  30 tablet  0   No current facility-administered medications on file prior to visit.     Review of Systems  Constitutional: Negative for unexpected weight change, or unusual diaphoresis  HENT: Negative for tinnitus.   Eyes: Negative for photophobia and visual disturbance.  Respiratory: Negative for choking and stridor.   Gastrointestinal: Negative for vomiting and blood in stool.  Genitourinary: Negative for hematuria and decreased urine volume.  Musculoskeletal: Negative for acute joint swelling Skin: Negative for color change and wound.  Neurological: Negative for tremors and numbness other than noted  Psychiatric/Behavioral: Negative for decreased concentration or  hyperactivity.        Objective:   Physical Exam BP 102/70  Pulse 72  Temp(Src) 97.7 F (36.5 C) (Oral)  Ht 5' 6.5" (1.689 m)  Wt 233 lb 6 oz (105.858 kg)  BMI 37.11 kg/m2  SpO2 96% VS noted, uncomfortable, not ill appearing Constitutional: Pt appears well-developed and well-nourished.  HENT: Head: NCAT.  Right Ear: External ear normal.  Left Ear: External ear normal.  Eyes:  bilat upper/lower lid erythema/mild swelling with bialt Conjunctivae iinjection and clearish d/c, and EOM are normal. Pupils are equal, round, and reactive to light.  Bilat tm's with mild erythema.  Max sinus areas non tender.  Pharynx with mild erythema, no exudate Neck: Normal range of motion. Neck supple.  Cardiovascular: Normal rate and regular rhythm.   Pulmonary/Chest: Effort normal and breath sounds normal.  - no rales or wheezing Neurological: Pt is alert. Not confused  Skin: Skin is warm. No hives or rash Psychiatric: Pt behavior is normal. Thought content normal.      Assessment & Plan:

## 2013-07-23 LAB — HM MAMMOGRAPHY: HM Mammogram: NEGATIVE

## 2013-11-24 ENCOUNTER — Encounter: Payer: Self-pay | Admitting: Family Medicine

## 2013-11-24 ENCOUNTER — Encounter: Payer: Self-pay | Admitting: Internal Medicine

## 2013-11-24 ENCOUNTER — Ambulatory Visit (INDEPENDENT_AMBULATORY_CARE_PROVIDER_SITE_OTHER): Payer: Medicare Other | Admitting: Internal Medicine

## 2013-11-24 ENCOUNTER — Ambulatory Visit (INDEPENDENT_AMBULATORY_CARE_PROVIDER_SITE_OTHER): Payer: Medicare Other | Admitting: Family Medicine

## 2013-11-24 ENCOUNTER — Other Ambulatory Visit (INDEPENDENT_AMBULATORY_CARE_PROVIDER_SITE_OTHER): Payer: Medicare Other

## 2013-11-24 ENCOUNTER — Ambulatory Visit (INDEPENDENT_AMBULATORY_CARE_PROVIDER_SITE_OTHER)
Admission: RE | Admit: 2013-11-24 | Discharge: 2013-11-24 | Disposition: A | Discharge status: Home / Self Care | Payer: Medicare Other | Source: Ambulatory Visit | Attending: Internal Medicine | Admitting: Internal Medicine

## 2013-11-24 VITALS — BP 120/78 | HR 92 | Temp 99.1°F | Ht 66.0 in | Wt 237.5 lb

## 2013-11-24 VITALS — BP 120/78 | HR 92 | Temp 99.1°F | Ht 66.0 in | Wt 237.0 lb

## 2013-11-24 DIAGNOSIS — R0609 Other forms of dyspnea: Secondary | ICD-10-CM

## 2013-11-24 DIAGNOSIS — R0600 Dyspnea, unspecified: Secondary | ICD-10-CM | POA: Insufficient documentation

## 2013-11-24 DIAGNOSIS — M25569 Pain in unspecified knee: Secondary | ICD-10-CM

## 2013-11-24 DIAGNOSIS — Z136 Encounter for screening for cardiovascular disorders: Secondary | ICD-10-CM

## 2013-11-24 DIAGNOSIS — R7302 Impaired glucose tolerance (oral): Secondary | ICD-10-CM

## 2013-11-24 DIAGNOSIS — M7120 Synovial cyst of popliteal space [Baker], unspecified knee: Secondary | ICD-10-CM | POA: Insufficient documentation

## 2013-11-24 DIAGNOSIS — M25562 Pain in left knee: Secondary | ICD-10-CM

## 2013-11-24 DIAGNOSIS — R0989 Other specified symptoms and signs involving the circulatory and respiratory systems: Secondary | ICD-10-CM

## 2013-11-24 DIAGNOSIS — R7309 Other abnormal glucose: Secondary | ICD-10-CM

## 2013-11-24 DIAGNOSIS — R9431 Abnormal electrocardiogram [ECG] [EKG]: Secondary | ICD-10-CM

## 2013-11-24 DIAGNOSIS — Z23 Encounter for immunization: Secondary | ICD-10-CM

## 2013-11-24 DIAGNOSIS — M7122 Synovial cyst of popliteal space [Baker], left knee: Secondary | ICD-10-CM

## 2013-11-24 DIAGNOSIS — E785 Hyperlipidemia, unspecified: Secondary | ICD-10-CM

## 2013-11-24 DIAGNOSIS — Z0000 Encounter for general adult medical examination without abnormal findings: Secondary | ICD-10-CM

## 2013-11-24 LAB — BASIC METABOLIC PANEL
BUN: 13 mg/dL (ref 6–23)
Chloride: 106 mEq/L (ref 96–112)
Glucose, Bld: 116 mg/dL — ABNORMAL HIGH (ref 70–99)
Potassium: 5.1 mEq/L (ref 3.5–5.1)
Sodium: 138 mEq/L (ref 135–145)

## 2013-11-24 LAB — HEPATIC FUNCTION PANEL
ALT: 21 U/L (ref 0–35)
AST: 23 U/L (ref 0–37)
Albumin: 4.5 g/dL (ref 3.5–5.2)
Bilirubin, Direct: 0.1 mg/dL (ref 0.0–0.3)
Total Protein: 7.2 g/dL (ref 6.0–8.3)

## 2013-11-24 LAB — URINALYSIS, ROUTINE W REFLEX MICROSCOPIC
Bilirubin Urine: NEGATIVE
Ketones, ur: NEGATIVE
Specific Gravity, Urine: 1.025 (ref 1.000–1.030)
Urine Glucose: NEGATIVE
Urobilinogen, UA: 0.2 (ref 0.0–1.0)

## 2013-11-24 LAB — CBC WITH DIFFERENTIAL/PLATELET
Basophils Absolute: 0.1 10*3/uL (ref 0.0–0.1)
Eosinophils Absolute: 0.2 10*3/uL (ref 0.0–0.7)
Eosinophils Relative: 1.8 % (ref 0.0–5.0)
Hemoglobin: 14.6 g/dL (ref 12.0–15.0)
Lymphs Abs: 4.9 10*3/uL — ABNORMAL HIGH (ref 0.7–4.0)
MCHC: 34.1 g/dL (ref 30.0–36.0)
Monocytes Absolute: 1 10*3/uL (ref 0.1–1.0)
Monocytes Relative: 7.7 % (ref 3.0–12.0)
Neutrophils Relative %: 51.5 % (ref 43.0–77.0)
Platelets: 338 10*3/uL (ref 150.0–400.0)
WBC: 12.7 10*3/uL — ABNORMAL HIGH (ref 4.5–10.5)

## 2013-11-24 LAB — LIPID PANEL
Cholesterol: 178 mg/dL (ref 0–200)
VLDL: 29.4 mg/dL (ref 0.0–40.0)

## 2013-11-24 LAB — TSH: TSH: 6.62 u[IU]/mL — ABNORMAL HIGH (ref 0.35–5.50)

## 2013-11-24 LAB — HEMOGLOBIN A1C: Hgb A1c MFr Bld: 6 % (ref 4.6–6.5)

## 2013-11-24 IMAGING — CR DG CHEST 2V
2 series · 2 of 2 positions shown · non-contrast
Comparison: [DATE]

CLINICAL DATA: Dyspnea on exertion

EXAM:
CHEST  2 VIEW

[view not recorded (1 of 2)]
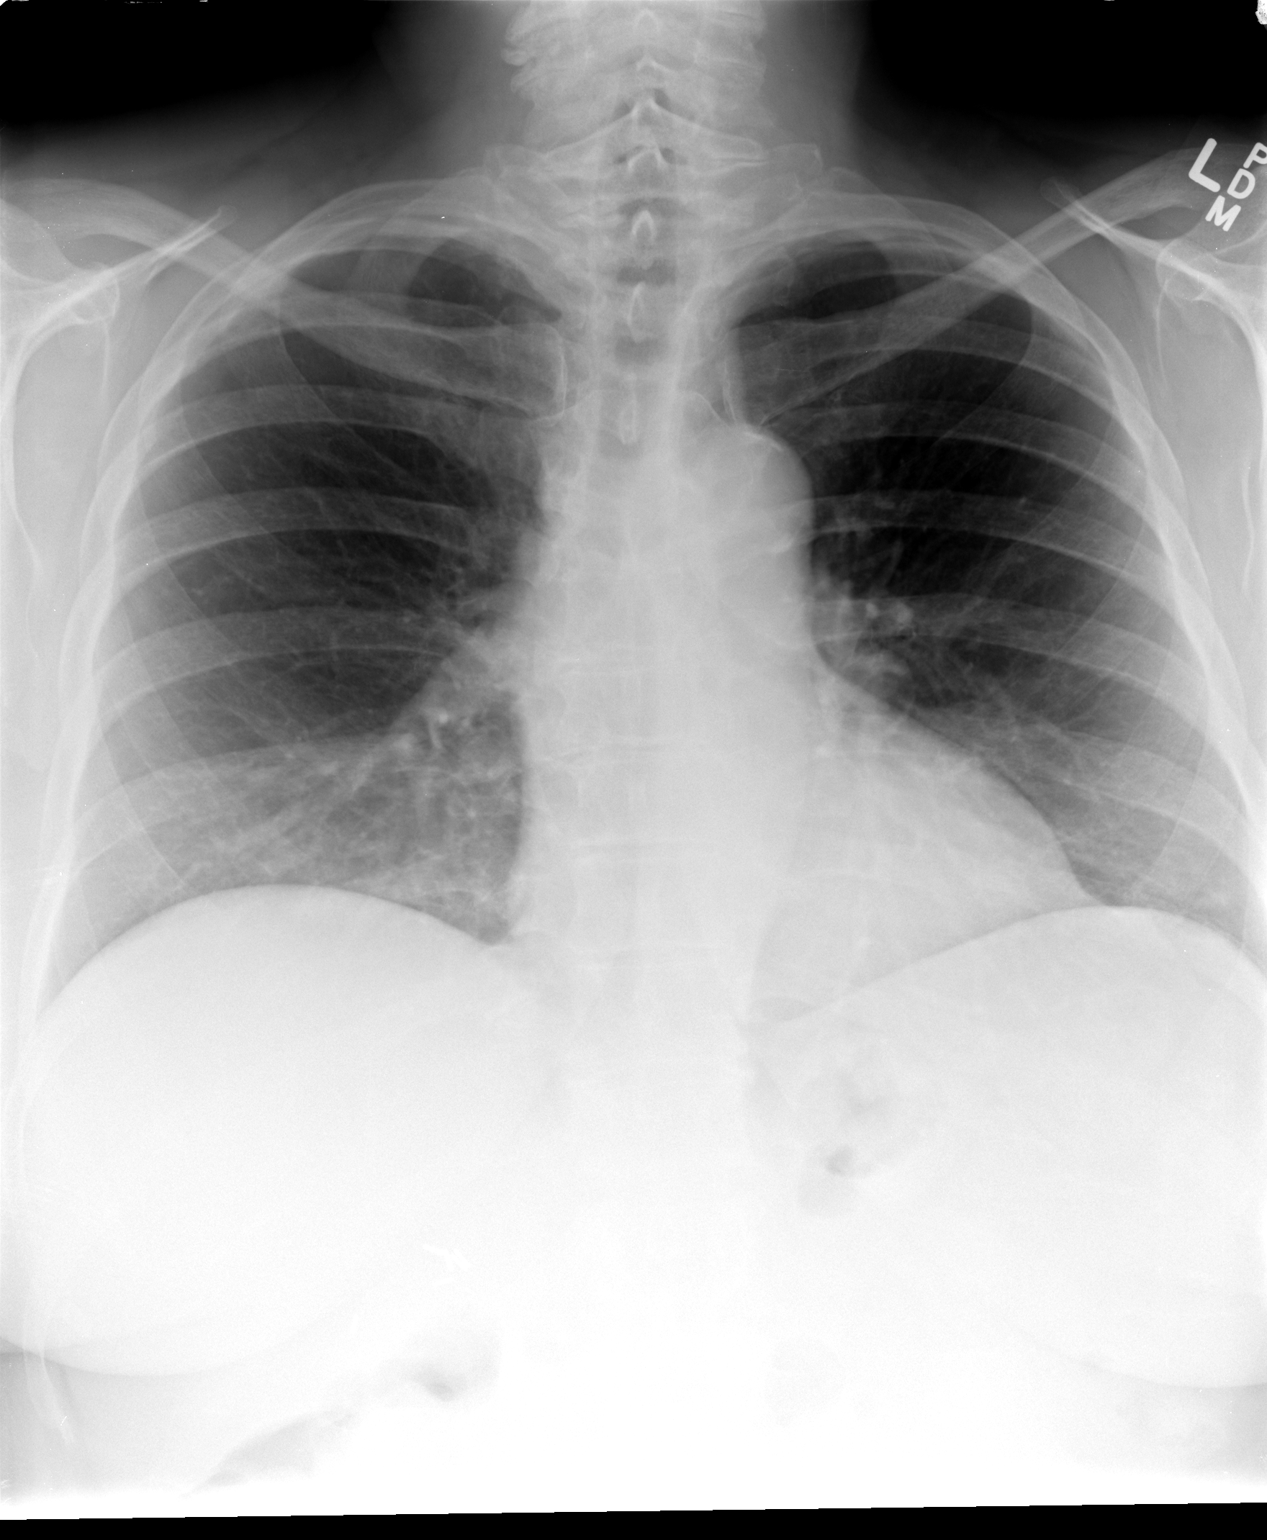

[view not recorded (2 of 2)]
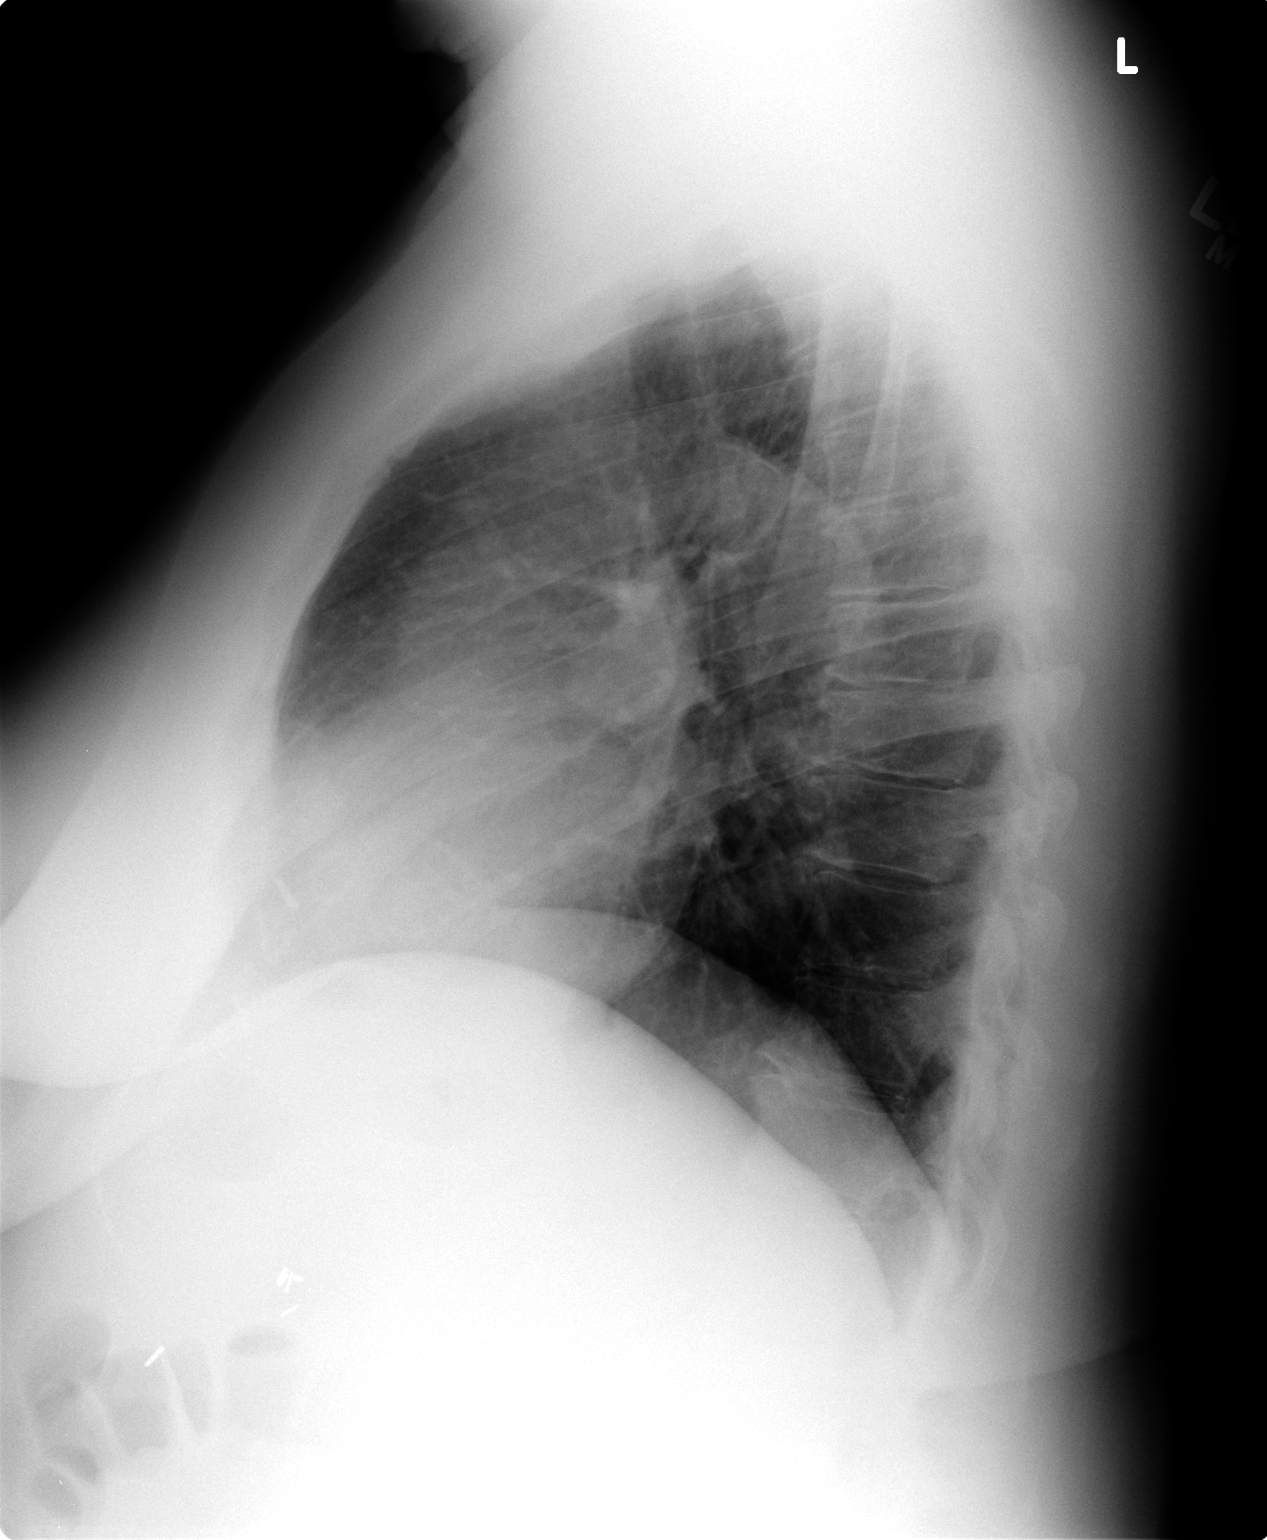

[2 of 2 positions shown; findings below may reference images not displayed]

FINDINGS: The heart size and mediastinal contours are within normal limits.
Both lungs are clear. The visualized skeletal structures are
unremarkable.
IMPRESSION: No active cardiopulmonary disease.

## 2013-11-24 NOTE — Assessment & Plan Note (Signed)
stable overall by history and exam, recent data reviewed with pt, and pt to continue medical treatment as before,  to f/u any worsening symptoms or concerns Lab Results  Component Value Date   HGBA1C 6.0 11/24/2013

## 2013-11-24 NOTE — Patient Instructions (Signed)
Good to see you Try exerc sies most days of the week Take tylenol 650 mg three times a day is the best evidence based medicine we have for arthritis.  Glucosamine sulfate 750mg  twice a day is a supplement that has been shown to help moderate to severe arthritis. Vitamin D 1000 IU daily Fish oil 2 grams daily.  Tumeric 500mg  twice daily.  Capsaicin topically up to four times a day may also help with pain. Cortisone injections are an option if these interventions do not seem to make a difference or need more relief.  If cortisone injections do not help, there are different types of shots that may help but they take longer to take effect.  We can discuss this at follow up.  It's important that you continue to stay active. Controlling your weight is important.  Consider physical therapy to strengthen muscles around the joint that hurts to take pressure off of the joint itself. Shoe inserts with good arch support may be helpful.  [facility] at [employer] [facility] could help.  Water aerobics and cycling with low resistance are the best two types of exercise for arthritis. Come back and see me in 2 weeks.

## 2013-11-24 NOTE — Patient Instructions (Addendum)
You had the flu shot today Please return in 2 wks for a Nurse Visit for the new Prevnar pneumonia shot Please continue all other medications as before, and refills have been done if requested. Please have the pharmacy call with any other refills you may need. Please continue your efforts at being more active, low cholesterol diet, and weight control. You are otherwise up to date with prevention measures today.  You will be contacted regarding the referral for: echocardiogram  Please go to the LAB in the Basement (turn left off the elevator) for the tests to be done today Please go to the XRAY Department in the Basement (go straight as you get off the elevator) for the x-ray testing You will be contacted by phone if any changes need to be made immediately.  Otherwise, you will receive a letter about your results with an explanation, but please check with MyChart first.  You have an 1115 am appt with Dr Katrinka Blazing (though this may not be exact as you are a "workin" this morning)  Please return in 1 year for your yearly visit, or sooner if needed, with Lab testing done 3-5 days before

## 2013-11-24 NOTE — Assessment & Plan Note (Signed)
For sport med referall today

## 2013-11-24 NOTE — Progress Notes (Signed)
Subjective:    Patient ID: Alexandra Wolfe, female    DOB: Mar 10, 1946, 67 y.o.   MRN: [id number]  HPI  Here for wellness and f/u;  Overall doing ok;  Pt denies CP,  wheezing, orthopnea, PND, worsening LE edema, palpitations, dizziness or syncope, but has gained wt, also with worsening sob/doe.   Pt denies neurological change such as new headache, facial or extremity weakness.  Pt denies polydipsia, polyuria, or low sugar symptoms. Pt states overall good compliance with treatment and medications, good tolerability, and has been trying to follow lower cholesterol diet.  Pt denies worsening depressive symptoms, suicidal ideation or panic. No fever, night sweats, wt loss, loss of appetite, or other constitutional symptoms.  Pt states good ability with ADL's, has low fall risk, home safety reviewed and adequate, no other significant changes in hearing or vision, and minimally active with exercise.  No other acute complaints except for knee pain, hard to walk today. Past Medical History  Diagnosis Date  . HYPERLIPIDEMIA 03/11/2008  . COLONIC POLYPS, HX OF 09/13/2007  . DEPRESSION 09/13/2007  . OSTEOARTHROSIS NOS, LOWER LEG 09/13/2007  . Chronic LBP   . BACK PAIN 09/27/2008  . BUNIONS, BILATERAL 12/23/2007  . CHEST DISCOMFORT, ATYPICAL 11/07/2009  . CONSTIPATION 09/27/2008  . DYSPNEA ON EXERTION 02/06/2010  . Eustachian tube dysfunction 05/20/2011  . FOOT PAIN, RIGHT 09/27/2008  . LBP (low back pain) 05/20/2011  . SHOULDER PAIN, LEFT 02/14/2009  . SYMPTOM, PALPITATIONS 09/13/2007  . URINARY INCONTINENCE 08/23/2009  . Vertigo 05/20/2011  . Leukocytosis 11/20/2011  . SVT (supraventricular tachycardia)   . Diabetes mellitus     diet controlled  . Anxiety   . ALLERGIC RHINITIS 04/02/2009    no per pt   Past Surgical History  Procedure Laterality Date  . Ccx    . Lumbar laminectomy    . Cholecystectomy    . Lumbar laminectomy    . Bunionectomy      right  . Shoulder arthroscopy  12/13/2011   Procedure: ARTHROSCOPY SHOULDER;  Surgeon: Verlee Rossetti;  Location: [facility] OR;  Service: Orthopedics;  Laterality: Left;  Left Shoulder ArthroscopyDebridement Limited Tenodesis Open Rotator Cuff Repair Spur Removal Right Shoulder Injection     reports that she has quit smoking. She has never used smokeless tobacco. She reports that she does not drink alcohol or use illicit drugs. family history includes Colon cancer in her mother; Diabetes in her other; Diabetes type II in her father; Heart disease in her brother and father. There is no history of Esophageal cancer, Stomach cancer, or Rectal cancer. Allergies  Allergen Reactions  . Aripiprazole     REACTION: agitation  . Simvastatin     REACTION: myalgia   Current Outpatient Prescriptions on File Prior to Visit  Medication Sig Dispense Refill  . aspirin 81 MG tablet Take 81 mg by mouth daily.        [name] atenolol (TENORMIN) 25 MG tablet TAKE 1 TABLET BY MOUTH EVERY DAY  90 tablet  3  . cetirizine (ZYRTEC) 10 MG tablet Take 1 tablet (10 mg total) by mouth daily.  30 tablet  11  . Cholecalciferol (VITAMIN D) 2000 UNITS tablet Take 2,000 Units by mouth daily.        . fluticasone (FLONASE) 50 MCG/ACT nasal spray Place 2 sprays into the nose daily as needed. For allergies  16 g  11   No current facility-administered medications on file prior to visit.   Review of Systems Constitutional:  Negative for diaphoresis, activity change, appetite change or unexpected weight change.  HENT: Negative for hearing loss, ear pain, facial swelling, mouth sores and neck stiffness.   Eyes: Negative for pain, redness and visual disturbance.  Respiratory: Negative for shortness of breath and wheezing.   Cardiovascular: Negative for chest pain and palpitations.  Gastrointestinal: Negative for diarrhea, blood in stool, abdominal distention or other pain Genitourinary: Negative for hematuria, flank pain or change in urine volume.  Musculoskeletal: Negative for  myalgias and joint swelling.  Skin: Negative for color change and wound.  Neurological: Negative for syncope and numbness. other than noted Hematological: Negative for adenopathy.  Psychiatric/Behavioral: Negative for hallucinations, self-injury, decreased concentration and agitation.      Objective:   Physical Exam BP 120/78  Pulse 92  Temp(Src) 99.1 F (37.3 C) (Oral)  Ht 5\' 6"  (1.676 m)  Wt 237 lb 8 oz (107.729 kg)  BMI 38.35 kg/m2  SpO2 97% VS noted,  Constitutional: Pt is oriented to person, place, and time. Appears well-developed and well-nourished.  Head: Normocephalic and atraumatic.  Right Ear: External ear normal.  Left Ear: External ear normal.  Nose: Nose normal.  Mouth/Throat: Oropharynx is clear and moist.  Eyes: Conjunctivae and EOM are normal. Pupils are equal, round, and reactive to light.  Neck: Normal range of motion. Neck supple. No JVD present. No tracheal deviation present.  Cardiovascular: Normal rate, regular rhythm, normal heart sounds and intact distal pulses.   Pulmonary/Chest: Effort normal and breath sounds normal.  Abdominal: Soft. Bowel sounds are normal. There is no tenderness. No HSM  Musculoskeletal: Normal range of motion. Exhibits no edema.  Lymphadenopathy:  Has no cervical adenopathy.  Neurological: Pt is alert and oriented to person, place, and time. Pt has normal reflexes. No cranial nerve deficit.  Skin: Skin is warm and dry. No rash noted.  Psychiatric:  Has  Mild depressed mood and affect. Behavior is normal.     Assessment & Plan:

## 2013-11-24 NOTE — Assessment & Plan Note (Signed)
With nonspecific ECG abnormal, for echo, cxr

## 2013-11-24 NOTE — Progress Notes (Signed)
Pre-visit discussion using our clinic review tool. No additional management support is needed unless otherwise documented below in the visit note.  

## 2013-11-24 NOTE — Progress Notes (Signed)
I'm seeing this patient by the request  of:  Oliver Barre, MD  CC: Left knee pain  HPI: Patient is a very pleasant 67 year old female who is coming in with bilateral knee pain left greater than right. Patient has had this pain for quite some time. Patient states it hurts more on the inside as well as the posterior aspect of the left knee. Patient does not remember any true injury. Patient describes the pain as more as a total aching sensation. Patient does notice that she has limited range of motion because she is having swelling in the posterior aspect of her knee. Denies any radiation of pain denies any numbness. Patient states that she is able to ambulate usually without any trouble but has noticed recently that she is limping some. Onset: Patient denies any mechanical symptoms such as giving out on her. Patient states the pain can keep her up at night it does not wake her up at night. Patient is a severity a 6/10.   Past medical, surgical, family and social history reviewed. Medications reviewed all in the electronic medical record.   Review of Systems: No headache, visual changes, nausea, vomiting, diarrhea, constipation, dizziness, abdominal pain, skin rash, fevers, chills, night sweats, weight loss, swollen lymph nodes, body aches, joint swelling, muscle aches, chest pain, shortness of breath, mood changes.   Objective:    Blood pressure 120/78, pulse 92, temperature 99.1 F (37.3 C), temperature source Oral, height 5\' 6"  (1.676 m), weight 237 lb (107.502 kg).   General: No apparent distress alert and oriented x3 mood and affect normal, dressed appropriately.  HEENT: Pupils equal, extraocular movements intact Respiratory: Patient's speak in full sentences and does not appear short of breath Cardiovascular: No lower extremity edema, non tender, no erythema Skin: Warm dry intact with no signs of infection or rash on extremities or on axial skeleton. Abdomen: Soft nontender Neuro: Cranial  nerves II through XII are intact, neurovascularly intact in all extremities with 2+ DTRs and 2+ pulses. Lymph: No lymphadenopathy of posterior or anterior cervical chain or axillae bilaterally.  Gait normal with good balance and coordination.  MSK: Non tender with full range of motion and good stability and symmetric strength and tone of shoulders, elbows, wrist, hip, and ankles bilaterally.  Knee: Left Normal to inspection with no erythema patient does have trace effusion as well as osteoarthritic changes of the knees bilaterally Palpation reveals patient does have joint line tenderness of the medial joint as well as fullness of the posterior fossa representing a Baker's cyst. ROM shows mild resistance in flexion secondary to the fullness. Ligaments with solid consistent endpoints including ACL, PCL, LCL, MCL. Positive Mcmurray's, Apley's, and Thessalonian tests. Non painful patellar compression. Patellar glide with marked crepitus. Patellar and quadriceps tendons unremarkable. Hamstring and quadriceps strength is normal.  Contralateral knee unremarkable except for the osteoarthritic changes as noted above.  MSK US performed of: Left knee This study was ordered, performed, and interpreted by Terrilee Files D.O.  Knee: All structures visualized. Anteromedial, anterolateral, posteromedial, and posterolateral menisci unremarkable without tearing, fraying, effusion, or displacement. Patient does have joint line narrowing with osteoarthritic changes noted. Patellar Tendon unremarkable on long and transverse views without effusion. No abnormality of prepatellar bursa. LCL and MCL unremarkable on long and transverse views. No abnormality of origin of medial or lateral head of the gastrocnemius. Patient though does have a Baker cyst that measures approximately 3 x 3 cm. This does appear to be septated.  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 Patient then had an 18-gauge 1-1/2 inch needle and we removed 10 cc of clear yellow strawlike fluid. Patient then had 1 cc of Kenalog 40 mg/dL injected into the bursa and patient was put in a compression dressing. Patient tolerated the procedure well with no blood loss. Postinjection instructions given.  Body mass index is 38.27 kg/(m^2). Secondary to this patient was to have her injection under ultrasound guidance. Pictures taken.  Impression and Recommendations:     This case required medical decision making of moderate complexity.

## 2013-11-24 NOTE — Assessment & Plan Note (Signed)
Patient tolerated the procedure well. Patient told about compression dressing, icing protocol, home exercise program given. Patient does have osteoarthritis of the knee and I would like to see her back in 2 weeks. If she continues to have pain I would do an injection into the knee joint itself as well as getting x-rays to get a baseline. She is having no mechanical symptoms at this time but if this started we would do further workup. Patient will try over-the-counter medications as well on an as-needed basis.

## 2013-11-24 NOTE — Assessment & Plan Note (Signed)

## 2013-12-08 ENCOUNTER — Ambulatory Visit: Payer: Medicare Other

## 2013-12-08 ENCOUNTER — Ambulatory Visit: Payer: Medicare Other | Admitting: Family Medicine

## 2013-12-16 ENCOUNTER — Encounter: Payer: Self-pay | Admitting: Internal Medicine

## 2013-12-16 ENCOUNTER — Encounter: Payer: Self-pay | Admitting: Cardiology

## 2013-12-16 ENCOUNTER — Ambulatory Visit (HOSPITAL_COMMUNITY): Payer: Medicare Other | Attending: Internal Medicine | Admitting: Radiology

## 2013-12-16 DIAGNOSIS — R0600 Dyspnea, unspecified: Secondary | ICD-10-CM

## 2013-12-16 DIAGNOSIS — E785 Hyperlipidemia, unspecified: Secondary | ICD-10-CM | POA: Insufficient documentation

## 2013-12-16 DIAGNOSIS — R9431 Abnormal electrocardiogram [ECG] [EKG]: Secondary | ICD-10-CM

## 2013-12-16 DIAGNOSIS — F17200 Nicotine dependence, unspecified, uncomplicated: Secondary | ICD-10-CM | POA: Insufficient documentation

## 2013-12-16 DIAGNOSIS — R0602 Shortness of breath: Secondary | ICD-10-CM | POA: Insufficient documentation

## 2013-12-16 DIAGNOSIS — R0609 Other forms of dyspnea: Secondary | ICD-10-CM | POA: Insufficient documentation

## 2013-12-16 DIAGNOSIS — R0989 Other specified symptoms and signs involving the circulatory and respiratory systems: Secondary | ICD-10-CM | POA: Insufficient documentation

## 2013-12-16 NOTE — Progress Notes (Signed)
Echocardiogram performed.  

## 2014-01-23 ENCOUNTER — Other Ambulatory Visit: Payer: Self-pay | Admitting: Internal Medicine

## 2014-04-23 ENCOUNTER — Other Ambulatory Visit: Payer: Self-pay | Admitting: Internal Medicine

## 2014-10-10 ENCOUNTER — Encounter: Payer: Self-pay | Admitting: Family Medicine

## 2014-11-29 ENCOUNTER — Other Ambulatory Visit: Payer: Self-pay | Admitting: Internal Medicine

## 2014-12-08 ENCOUNTER — Other Ambulatory Visit: Payer: Self-pay | Admitting: Internal Medicine

## 2014-12-30 ENCOUNTER — Ambulatory Visit: Payer: Self-pay | Admitting: Internal Medicine

## 2015-01-20 ENCOUNTER — Encounter: Payer: Self-pay | Admitting: Cardiology

## 2015-01-20 ENCOUNTER — Encounter: Payer: Self-pay | Admitting: Cardiovascular Disease

## 2015-03-01 ENCOUNTER — Encounter: Payer: Self-pay | Admitting: Internal Medicine

## 2015-03-01 ENCOUNTER — Ambulatory Visit (INDEPENDENT_AMBULATORY_CARE_PROVIDER_SITE_OTHER): Payer: Medicare Other | Admitting: Internal Medicine

## 2015-03-01 ENCOUNTER — Other Ambulatory Visit (INDEPENDENT_AMBULATORY_CARE_PROVIDER_SITE_OTHER): Payer: Medicare Other

## 2015-03-01 VITALS — BP 118/80 | HR 65 | Temp 97.9°F | Resp 18 | Ht 66.0 in | Wt 236.8 lb

## 2015-03-01 DIAGNOSIS — E039 Hypothyroidism, unspecified: Secondary | ICD-10-CM | POA: Diagnosis not present

## 2015-03-01 DIAGNOSIS — Z0000 Encounter for general adult medical examination without abnormal findings: Secondary | ICD-10-CM | POA: Diagnosis not present

## 2015-03-01 DIAGNOSIS — R7302 Impaired glucose tolerance (oral): Secondary | ICD-10-CM

## 2015-03-01 DIAGNOSIS — Z23 Encounter for immunization: Secondary | ICD-10-CM

## 2015-03-01 DIAGNOSIS — R112 Nausea with vomiting, unspecified: Secondary | ICD-10-CM | POA: Diagnosis not present

## 2015-03-01 LAB — BASIC METABOLIC PANEL
BUN: 16 mg/dL (ref 6–23)
CALCIUM: 9.7 mg/dL (ref 8.4–10.5)
CO2: 26 meq/L (ref 19–32)
CREATININE: 0.81 mg/dL (ref 0.40–1.20)
Chloride: 104 mEq/L (ref 96–112)
GFR: 74.43 mL/min (ref >60.00)
Glucose, Bld: 109 mg/dL — ABNORMAL HIGH (ref 70–99)
Potassium: 4.4 mEq/L (ref 3.5–5.1)
Sodium: 138 mEq/L (ref 135–145)

## 2015-03-01 LAB — HEPATIC FUNCTION PANEL
ALT: 20 U/L (ref 0–35)
AST: 23 U/L (ref 0–37)
Albumin: 4.6 g/dL (ref 3.5–5.2)
Alkaline Phosphatase: 103 U/L (ref 39–117)
BILIRUBIN DIRECT: 0.1 mg/dL (ref 0.0–0.3)
BILIRUBIN TOTAL: 0.6 mg/dL (ref 0.2–1.2)
Total Protein: 7.4 g/dL (ref 6.0–8.3)

## 2015-03-01 LAB — LIPID PANEL
CHOL/HDL RATIO: 4
Cholesterol: 150 mg/dL (ref 0–200)
HDL: 40.6 mg/dL (ref >39.00)
LDL Cholesterol: 78 mg/dL (ref 0–99)
NonHDL: 109.4
Triglycerides: 155 mg/dL — ABNORMAL HIGH (ref 0.0–149.0)
VLDL: 31 mg/dL (ref 0.0–40.0)

## 2015-03-01 LAB — URINALYSIS, ROUTINE W REFLEX MICROSCOPIC
Bilirubin Urine: NEGATIVE
HGB URINE DIPSTICK: NEGATIVE
Ketones, ur: NEGATIVE
Leukocytes, UA: NEGATIVE
Nitrite: NEGATIVE
RBC / HPF: NONE SEEN (ref 0–?)
Specific Gravity, Urine: 1.02 (ref 1.000–1.030)
Total Protein, Urine: NEGATIVE
UROBILINOGEN UA: 0.2 (ref 0.0–1.0)
Urine Glucose: NEGATIVE
pH: 6 (ref 5.0–8.0)

## 2015-03-01 LAB — CBC WITH DIFFERENTIAL/PLATELET
Basophils Absolute: 0.1 10*3/uL (ref 0.0–0.1)
Basophils Relative: 0.6 % (ref 0.0–3.0)
Eosinophils Absolute: 0.1 10*3/uL (ref 0.0–0.7)
Eosinophils Relative: 1.1 % (ref 0.0–5.0)
HCT: 44.3 % (ref 36.0–46.0)
HEMOGLOBIN: 15.2 g/dL — AB (ref 12.0–15.0)
Lymphocytes Relative: 34 % (ref 12.0–46.0)
Lymphs Abs: 4.1 10*3/uL — ABNORMAL HIGH (ref 0.7–4.0)
MCHC: 34.2 g/dL (ref 30.0–36.0)
MCV: 89.3 fl (ref 78.0–100.0)
Monocytes Absolute: 0.8 10*3/uL (ref 0.1–1.0)
Monocytes Relative: 6.7 % (ref 3.0–12.0)
NEUTROS ABS: 7 10*3/uL (ref 1.4–7.7)
Neutrophils Relative %: 57.6 % (ref 43.0–77.0)
Platelets: 335 10*3/uL (ref 150.0–400.0)
RBC: 4.95 Mil/uL (ref 3.87–5.11)
RDW: 13 % (ref 11.5–15.5)
WBC: 12.1 10*3/uL — ABNORMAL HIGH (ref 4.0–10.5)

## 2015-03-01 LAB — T4, FREE: Free T4: 0.67 ng/dL (ref 0.60–1.60)

## 2015-03-01 LAB — TSH: TSH: 3.53 u[IU]/mL (ref 0.35–4.50)

## 2015-03-01 LAB — HEMOGLOBIN A1C: HEMOGLOBIN A1C: 6.3 % (ref 4.6–6.5)

## 2015-03-01 MED ORDER — ATORVASTATIN CALCIUM 20 MG PO TABS: 20.0000 mg | ORAL_TABLET | Freq: Every day | ORAL | Status: DC

## 2015-03-01 NOTE — Progress Notes (Signed)
Subjective:    Patient ID: Alexandra Wolfe, female    DOB: 16-Feb-1946, 69 y.o.   MRN: [id number]  HPI  Here for wellness and f/u;  Overall doing ok;  Pt denies Chest pain, worsening SOB, DOE, wheezing, orthopnea, PND, worsening LE edema, palpitations, dizziness or syncope.  Pt denies neurological change such as new headache, facial or extremity weakness.  Pt denies polydipsia, polyuria, or low sugar symptoms. Pt states overall good compliance with treatment and medications, good tolerability, and has been trying to follow appropriate diet.  Pt denies worsening depressive symptoms, suicidal ideation or panic. No fever, night sweats, wt loss, loss of appetite, or other constitutional symptoms.  Pt states good ability with ADL's, has low fall risk, home safety reviewed and adequate, no other significant changes in hearing or vision, and only occasionally active with exercise. Admits to mostly sedentary, walks about 1 mile every few days.  Lives with son now whose wife left him and the son to help, and spends hours daily with her husband in [place] x 4 yrs Wt Readings from Last 3 Encounters:  03/01/15 236 lb 12.8 oz (107.412 kg)  11/24/13 237 lb (107.502 kg)  11/24/13 237 lb 8 oz (107.729 kg)   Past Medical History  Diagnosis Date  . HYPERLIPIDEMIA 03/11/2008  . COLONIC POLYPS, HX OF 09/13/2007  . DEPRESSION 09/13/2007  . OSTEOARTHROSIS NOS, LOWER LEG 09/13/2007  . Chronic LBP   . BACK PAIN 09/27/2008  . BUNIONS, BILATERAL 12/23/2007  . CHEST DISCOMFORT, ATYPICAL 11/07/2009  . CONSTIPATION 09/27/2008  . DYSPNEA ON EXERTION 02/06/2010  . Eustachian tube dysfunction 05/20/2011  . FOOT PAIN, RIGHT 09/27/2008  . LBP (low back pain) 05/20/2011  . SHOULDER PAIN, LEFT 02/14/2009  . SYMPTOM, PALPITATIONS 09/13/2007  . URINARY INCONTINENCE 08/23/2009  . Vertigo 05/20/2011  . Leukocytosis 11/20/2011  . SVT (supraventricular tachycardia)   . Diabetes mellitus     diet controlled  . Anxiety   . ALLERGIC RHINITIS  04/02/2009    no per pt   Past Surgical History  Procedure Laterality Date  . Ccx    . Lumbar laminectomy    . Cholecystectomy    . Lumbar laminectomy    . Bunionectomy      right  . Shoulder arthroscopy  12/13/2011    Procedure: ARTHROSCOPY SHOULDER;  Surgeon: Augustin Schooling;  Location: [place];  Service: Orthopedics;  Laterality: Left;  Left Shoulder ArthroscopyDebridement Limited Tenodesis Open Rotator Cuff Repair Spur Removal Right Shoulder Injection     reports that she has quit smoking. She has never used smokeless tobacco. She reports that she does not drink alcohol or use illicit drugs. family history includes Colon cancer in her mother; Diabetes in her other; Diabetes type II in her father; Heart disease in her brother and father. There is no history of Esophageal cancer, Stomach cancer, or Rectal cancer. Allergies  Allergen Reactions  . Aripiprazole     REACTION: agitation  . Simvastatin     REACTION: myalgia   Current Outpatient Prescriptions on File Prior to Visit  Medication Sig Dispense Refill  . aspirin 81 MG tablet Take 81 mg by mouth daily.      [name] atenolol (TENORMIN) 25 MG tablet TAKE 1 TABLET BY MOUTH DAILY 90 tablet 2  . cetirizine (ZYRTEC) 10 MG tablet Take 1 tablet (10 mg total) by mouth daily. 30 tablet 11  . Cholecalciferol (VITAMIN D) 2000 UNITS tablet Take 2,000 Units by mouth daily.      [name]  fluticasone (FLONASE) 50 MCG/ACT nasal spray Place 2 sprays into the nose daily as needed. For allergies 16 g 11   No current facility-administered medications on file prior to visit.     Review of Systems Constitutional: Negative for increased diaphoresis, other activity, appetite or siginficant weight change other than noted HENT: Negative for worsening hearing loss, ear pain, facial swelling, mouth sores and neck stiffness.   Eyes: Negative for other worsening pain, redness or visual disturbance.  Respiratory: Negative for shortness of breath and wheezing    Cardiovascular: Negative for chest pain and palpitations.  Gastrointestinal: Negative for diarrhea, blood in stool, abdominal distention or other pain Genitourinary: Negative for hematuria, flank pain or change in urine volume.  Musculoskeletal: Negative for myalgias or other joint complaints.  Skin: Negative for color change and wound or drainage.  Neurological: Negative for syncope and numbness. other than noted Hematological: Negative for adenopathy. or other swelling Psychiatric/Behavioral: Negative for hallucinations, SI, self-injury, decreased concentration or other worsening agitation.      Objective:   Physical Exam BP 118/80 mmHg  Pulse 65  Temp(Src) 97.9 F (36.6 C) (Oral)  Resp 18  Ht 5\' 6"  (1.676 m)  Wt 236 lb 12.8 oz (107.412 kg)  BMI 38.24 kg/m2  SpO2 97% VS noted,  Constitutional: Pt is oriented to person, place, and time. Appears well-developed and well-nourished, in no significant distress Head: Normocephalic and atraumatic.  Right Ear: External ear normal.  Left Ear: External ear normal.  Nose: Nose normal.  Mouth/Throat: Oropharynx is clear and moist.  Eyes: Conjunctivae and EOM are normal. Pupils are equal, round, and reactive to light.  Neck: Normal range of motion. Neck supple. No JVD present. No tracheal deviation present or significant neck LA or mass Cardiovascular: Normal rate, regular rhythm, normal heart sounds and intact distal pulses.   Pulmonary/Chest: Effort normal and breath sounds without rales or wheezing  Abdominal: Soft. Bowel sounds are normal. NT. No HSM  Musculoskeletal: Normal range of motion. Exhibits no edema.  Lymphadenopathy:  Has no cervical adenopathy.  Neurological: Pt is alert and oriented to person, place, and time. Pt has normal reflexes. No cranial nerve deficit. Motor grossly intact Skin: Skin is warm and dry. No rash noted.  Psychiatric:  Has normal mood and affect. Behavior is normal.      Assessment & Plan:

## 2015-03-01 NOTE — Addendum Note (Signed)
Addended by: Valerie Salts on: 03/01/2015 01:54 PM   Modules accepted: Orders

## 2015-03-01 NOTE — Patient Instructions (Addendum)
You had the new Prevnar pneumonia shot today  Please continue all other medications as before, and refills have been done if requested.  Please have the pharmacy call with any other refills you may need.  Please continue your efforts at being more active, low cholesterol diet, and weight control.  You are otherwise up to date with prevention measures today.  Please keep your appointments with your specialists as you may have planned  Please go to the LAB in the Basement (turn left off the elevator) for the tests to be done today  You will be contacted by phone if any changes need to be made immediately.  Otherwise, you will receive a letter about your results with an explanation, but please check with MyChart first.  Please remember to sign up for MyChart if you have not done so, as this will be important to you in the future with finding out test results, communicating by private email, and scheduling acute appointments online when needed.  Please return in 1 year for your yearly visit, or sooner if needed, with Lab testing done 3-5 days before

## 2015-03-01 NOTE — Progress Notes (Signed)
Pre visit review using our clinic review tool, if applicable. No additional management support is needed unless otherwise documented below in the visit note. 

## 2015-03-11 ENCOUNTER — Other Ambulatory Visit: Payer: Self-pay | Admitting: Internal Medicine

## 2015-06-28 ENCOUNTER — Telehealth: Payer: Self-pay | Admitting: Internal Medicine

## 2015-06-28 NOTE — Telephone Encounter (Signed)
Pt came in request Dr. Jenny Reichmann to send in nausea and dizziness medicine (pt does not remember the name of this med) to [facility] on [address]. Pt stated she got it from the ER and she never got the refill ever since then. Please help

## 2015-06-28 NOTE — Telephone Encounter (Signed)
Pt requests Rx of Zofran and Meclizine, please advise. Does pt need an OV?

## 2015-06-29 MED ORDER — ONDANSETRON 8 MG PO TBDP: 8.0000 mg | ORAL_TABLET | Freq: Three times a day (TID) | ORAL | Status: DC | PRN

## 2015-06-29 MED ORDER — MECLIZINE HCL 25 MG PO TABS: 25.0000 mg | ORAL_TABLET | Freq: Three times a day (TID) | ORAL | Status: DC | PRN

## 2015-06-29 NOTE — Telephone Encounter (Signed)
Both done erx 

## 2015-09-03 ENCOUNTER — Other Ambulatory Visit: Payer: Self-pay | Admitting: Internal Medicine

## 2015-10-26 ENCOUNTER — Telehealth: Payer: Self-pay

## 2015-10-26 NOTE — Telephone Encounter (Signed)
Call to intro AWV but the patient did not have a vm and could not leave a message

## 2015-11-06 NOTE — Telephone Encounter (Signed)
2nd outreach for AWV; Next preventive exam is in march; To schedule AWV this year or preventive prior to next year's exam

## 2015-12-12 ENCOUNTER — Telehealth: Payer: Self-pay

## 2015-12-12 NOTE — Telephone Encounter (Signed)
Call to intro AWV; Agreed to come in Thursday; 1/5 at 2:15 pm

## 2015-12-14 ENCOUNTER — Telehealth: Payer: Self-pay

## 2015-12-14 ENCOUNTER — Ambulatory Visit: Payer: Medicare Other

## 2015-12-14 NOTE — Telephone Encounter (Signed)
Tried to contact patient, Alexandra Wolfe is out sick today and cannot keep annual wellness visit patient has scheduled with her today---voice mailbox has not been set up on patients phone and no answer, will keep trying periodically today to try to reach patient to cancel appt

## 2015-12-19 ENCOUNTER — Ambulatory Visit (INDEPENDENT_AMBULATORY_CARE_PROVIDER_SITE_OTHER): Payer: Medicare Other

## 2015-12-19 VITALS — Ht 66.0 in | Wt 258.0 lb

## 2015-12-19 DIAGNOSIS — Z0000 Encounter for general adult medical examination without abnormal findings: Secondary | ICD-10-CM

## 2015-12-19 NOTE — Progress Notes (Addendum)
Subjective:   Alexandra Wolfe is a 70 y.o. female who presents for Medicare Annual (Subsequent) preventive examination.  Review of Systems:  HRA assessment completed during visit; Cheryal, Goldfine The Patient was informed that this wellness visit is to identify risk and educate on how to reduce risk for increase disease through lifestyle changes.   ROS deferred at this assessment  Medical issues; Hx IGT;  Blood Glucose down to 90 in 2013; A1c trending up 6 and 6.3 from 2014 to 02/2015;  Lipids chol 150; Trig 155; HDL 40 and LDL 70  Educated regarding decreased insulin sensitivity; exercise and weight loss   BMI: 41.6 Diet; Gained 8 lbs; Goal 190; has been 4 to 5 years;  Kept spouse at home (dementia) and 2 strokes; Nsg home x 5 years; past away May of this year.  Self sacrificing x 8 years; did take grief session x 6 weeks post death of spouse Staying w son; and 21 yo grand son; at home 2 days other week and then 5 days every other week;   Admits to periods of depression; See assessment Dtr in [place], [place]; calls often; [facility];   Exercise; has personal trainer; meeting with her tomorrow and Thursday. Goes 3 times a week; to the [employer]; treadmill 10 to 15 minutes;    SAFETY Safety reviewed for the home; states she had to sell the home and move into a low income apt; which she does not consider safe; Discussed the feasibility of moving; after coaching; determines she can ask church family for assistance or recommendations; Motivation to fup is high;  Fall assessment / not voiced  Gait assessment; appears normal without issues  Mobilization and Functional losses in the last year. No States sleep is good.    Counseling: Colonoscopy; 03/2012 repeat in 10 years EKG: 11/2013 Hearing: unremarkable Dexa: 11/2012 neg Mammogram/ TBS Ophthalmology exam; TBS Immunizations Due deferred flu shot due to not feeling well today; tired   Current Care Team reviewed and  updated   Cardiac Risk Factors include: advanced age (>80men, >32 women);dyslipidemia;obesity (BMI >30kg/m2)     Objective:     Vitals: Ht 5\' 6"  (1.676 m)  Wt 258 lb (117.028 kg)  BMI 41.66 kg/m2  Tobacco History  Smoking status  . Former Smoker  Smokeless tobacco  . Never Used    Comment: Smoked as a teenager. Quit in 1970     Counseling given: Yes   Past Medical History  Diagnosis Date  . HYPERLIPIDEMIA 03/11/2008  . COLONIC POLYPS, HX OF 09/13/2007  . DEPRESSION 09/13/2007  . OSTEOARTHROSIS NOS, LOWER LEG 09/13/2007  . Chronic LBP   . BACK PAIN 09/27/2008  . BUNIONS, BILATERAL 12/23/2007  . CHEST DISCOMFORT, ATYPICAL 11/07/2009  . CONSTIPATION 09/27/2008  . DYSPNEA ON EXERTION 02/06/2010  . Eustachian tube dysfunction 05/20/2011  . FOOT PAIN, RIGHT 09/27/2008  . LBP (low back pain) 05/20/2011  . SHOULDER PAIN, LEFT 02/14/2009  . SYMPTOM, PALPITATIONS 09/13/2007  . URINARY INCONTINENCE 08/23/2009  . Vertigo 05/20/2011  . Leukocytosis 11/20/2011  . SVT (supraventricular tachycardia) ([place])   . Diabetes mellitus     diet controlled  . Anxiety   . ALLERGIC RHINITIS 04/02/2009    no per pt   Past Surgical History  Procedure Laterality Date  . Ccx    . Lumbar laminectomy    . Cholecystectomy    . Lumbar laminectomy    . Bunionectomy      right  . Shoulder arthroscopy  12/13/2011    Procedure: ARTHROSCOPY SHOULDER;  Surgeon: Augustin Schooling;  Location: [facility];  Service: Orthopedics;  Laterality: Left;  Left Shoulder ArthroscopyDebridement Limited Tenodesis Open Rotator Cuff Repair Spur Removal Right Shoulder Injection    Family History  Problem Relation Age of Onset  . Colon cancer Mother   . Heart disease Father   . Diabetes type II Father   . Heart disease Brother   . Diabetes Other     father  . Esophageal cancer Neg Hx   . Stomach cancer Neg Hx   . Rectal cancer Neg Hx    History  Sexual Activity  . Sexual Activity: No    Outpatient Encounter Prescriptions  as of 12/19/2015  Medication Sig  . aspirin 81 MG tablet Take 81 mg by mouth daily.    [name] atenolol (TENORMIN) 25 MG tablet TAKE 1 TABLET BY MOUTH DAILY  . atorvastatin (LIPITOR) 20 MG tablet Take 1 tablet (20 mg total) by mouth daily.  . calcium-vitamin D (OSCAL WITH D) 500-200 MG-UNIT tablet Take 1 tablet by mouth.  . cetirizine (ZYRTEC) 10 MG tablet Take 1 tablet (10 mg total) by mouth daily.  . Cholecalciferol (VITAMIN D) 2000 UNITS tablet Take 2,000 Units by mouth daily.    . fluticasone (FLONASE) 50 MCG/ACT nasal spray Place 2 sprays into the nose daily as needed. For allergies  . meclizine (ANTIVERT) 25 MG tablet Take 1 tablet (25 mg total) by mouth 3 (three) times daily as needed for dizziness.  . ondansetron (ZOFRAN ODT) 8 MG disintegrating tablet Take 1 tablet (8 mg total) by mouth every 8 (eight) hours as needed for nausea or vomiting.   No facility-administered encounter medications on file as of 12/19/2015.    Activities of Daily Living In your present state of health, do you have any difficulty performing the following activities: 12/19/2015  Hearing? N  Vision? N  Difficulty concentrating or making decisions? Y  Walking or climbing stairs? (No Data)  Dressing or bathing? N  Doing errands, shopping? N  Preparing Food and eating ? N  Using the Toilet? N  Managing your Medications? N  Managing your Finances? N  Housekeeping or managing your Housekeeping? N    Patient Care Team: Biagio Borg, MD as PCP - General    Assessment:    Assessment  A1c trending up; c/o of gaining weight, 8 lbs in 2 weeks; states she does not feel she is eating that much. A1c 6.3; discussed any symptoms of high bs, as thirst, frequent voiding which she states she does void freq;, no vision changes;   The patient feels her thyroid may be low and would like it checked;   The patient stated her goal weight was 190 and it had been approx 5 years ago since she weighed 190. Then stated she had cared  for her spouse, for 13 years who died in 04-18-23 from [place] and stroke; also states dtr in law recently left son and she is staying with son and 13 yo grand son every other week while her son works; also states long term pastor recently died at her church, which was a source of strength;  The patient states she does not have a lot of energy and has felt depressed. States she has "no feelings" but does cry at home intermittently. She did attend hospice counseling after her spouse passed away. She was in counseling during her early marriage many years ago. She does not talk about her grief;  Does not have friends from church over due to her living [place] the "[place] side of town". States it is not safe to walk and be out a lot. The patient agreed to complete the depression screenings which was elevated; the patient is sleeping and eating; States her depression has not interfered with her buying food, cleaning or taking care of her 3 bedroom apt.; paying bills; She now has Physiological scientist and this helps her energy and she does feel better after these work out sessions. She denies any thoughts of hurting herself;  States she is "strong" and does feel this is grief related;  Support system good; Loving; long term friends at church; son is happy she is with him every other week and loves grand son;   Discussed long term care-giving role and the action of anti-depressants to lift the mood through grief and change as well as counseling;  Agreed to come and see Dr. Jenny Reichmann. Is going to son's tomorrow and and Thursday and will be back on Friday and will see Dr. Jenny Reichmann at 2:30 on Friday 1/13 for further evaluation of mood and labs as needed.  All immunizations and health maintenance protocols were reviewed with the patient and needed orders were placed.Will defer flu shot as she did not feel good today but denied chest pain; sob; or other, just tired.   Education provided for laboratory screens deferred to Dr. Jenny Reichmann fup    Medication reconciliation, past medical history, social history, problem list and allergies were reviewed in detail with the patient  Goals were established for safety and the patient agreed that it may be time to move closer to her friends and church and will ask them for assistance in finding another place to live.   End of life planning has been completed.    Exercise Activities and Dietary recommendations Current Exercise Habits:: Structured exercise class, Type of exercise: walking;strength training/weights, Time (Minutes): 45, Frequency (Times/Week): 2 (has a Physiological scientist), Weekly Exercise (Minutes/Week): 90, Intensity: Moderate  Goals    . fup depressed mood     The patient discussed recent losses and admitted current environment is not in a particularly safe area; The patient set a goal of reaching out to church for assistance with move to a safer neighborhood;  States she has asked for help from the church at an earlier time and has high level of confidence that will receive help.   2nd goal is to make apt with dr. Jenny Reichmann (scheduled Friday at 2:30) for discussion and fup for depression;   3rd. Will outreach [employer] health if she feels she needs to process grief.       Fall Risk Fall Risk  12/19/2015 03/01/2015 11/24/2013 07/21/2013  Falls in the past year? No No No No   Depression Screen PHQ 2/9 Scores 12/19/2015 03/01/2015 11/24/2013 07/21/2013  PHQ - 2 Score 5 0 0 1  PHQ- 9 Score 14 - - -     Cognitive Testing MMSE - Mini Mental State Exam 12/19/2015  Not completed: Unable to complete   Admits to forgetfulness; apts but no failures of IADL's.   Immunization History  Administered Date(s) Administered  . H1N1 11/17/2008  . Influenza Whole 09/27/2008, 10/04/2009, 08/14/2010  . Influenza, Seasonal, Injecte, Preservative Fre 11/09/2012  . Influenza,inj,Quad PF,36+ Mos 11/24/2013  . Pneumococcal Conjugate-13 03/01/2015  . Pneumococcal Polysaccharide-23  09/27/2008, 11/09/2012  . Tdap 11/09/2012   Screening Tests Health Maintenance  Topic Date Due  . Hepatitis C Screening  1946/09/02  .  INFLUENZA VACCINE  07/10/2015  . MAMMOGRAM  07/24/2015  . COLONOSCOPY  03/20/2022  . TETANUS/TDAP  11/09/2022  . DEXA SCAN  Completed  . ZOSTAVAX  Addressed  . PNA vac Low Risk Adult  Completed      Plan:   Plan to come back on Friday and be medically evaluated by Dr. Jenny Reichmann.  During the course of the visit the patient was educated and counseled about the following appropriate screening and preventive services:   Vaccines to include Pneumoccal, Influenza, Hepatitis B, Td, Zostavax, HCV/ deferred flu due to now feeling well   Electrocardiogram  11/24/2013  Cardiovascular Disease / takes atenolol for fast heart rate per the patient  No hx of CV in her family   Colorectal cancer screening/03/2012; repeat 03/2031  Bone density screening/11/2012 and was neg  Diabetes screening/ A1c is pre-diabetic/ educated regarding exercise and weight loss to better manage diabetes; Will defer until energy level improves  Glaucoma screening/ deferred;   Mammography/PAP/ deferred due to lack of energy  Nutrition counseling / postponed today but will fup once labs are in place;   Will consider medical management per Dr. Jenny Reichmann of weight issues/ may place into coaching once medical issues r/o   Patient Instructions (the written plan) was given to the patient.   [id number], RN  12/19/2015   Medical screening examination/treatment/procedure(s) were performed by non-physician practitioner and as supervising physician I was immediately available for consultation/collaboration. I agree with above. Cathlean Cower, MD

## 2015-12-19 NOTE — Patient Instructions (Addendum)
Alexandra Wolfe , Thank you for taking time to come for your Medicare Wellness Visit. I appreciate your ongoing commitment to your health goals. Please review the following plan we discussed and let me know if I can assist you in the future.   Will fup on mammogram and flu shot;  Eye exam as needed; annually if accessible;  fup apt with Dr. Jenny Reichmann on 1/13!     These are the goals we discussed: Goals    . fup depressed mood     The patient discussed recent losses and admitted current environment is not in a particularly safe area; The patient set a goal of reaching out to church for assistance with move to a safer neighborhood;  States she has asked for help from the church at an earlier time and has high level of confidence that will receive help.   2nd goal is to make apt with dr. Jenny Reichmann (scheduled Friday at 2:30) for discussion and fup for depression;   3rd. Will outreach [employer] [facility] if she feels she needs to process grief.        This is a list of the screening recommended for you and due dates:  Health Maintenance  Topic Date Due  .  Hepatitis C: One time screening is recommended by Center for Disease Control  (CDC) for  adults born from [birth year] through 1966.   1946/07/26  . Flu Shot  07/10/2015  . Mammogram  07/24/2015  . Colon Cancer Screening  03/20/2022  . Tetanus Vaccine  11/09/2022  . DEXA scan (bone density measurement)  Completed  . Shingles Vaccine  Addressed  . Pneumonia vaccines  Completed   Complicated Grieving Grief is a normal response to the death of someone close to you. Feelings of fear, anger, and guilt can affect almost everyone who loses a loved one. It is also common to have symptoms of depression while you are grieving. These include problems with sleep, loss of appetite, and lack of energy. They may last for weeks or months after a loss. Complicated grief is different from normal grief or depression. Normal grieving involves sadness and feelings of  loss, but these feelings are not constant. Complicated grief is a constant and severe type of grief. It interferes with your ability to function normally. It may last for several months to a year or longer. Complicated grief may require treatment from a mental health care provider. CAUSES  It is not known why some people continue to struggle with grief and others do not. You may be at higher risk for complicated grief if:  The death of your loved one was sudden or unexpected.  The death of your loved one was due to a violent event.  Your loved one committed suicide.  Your loved one was a child or a young person.  You were very close to or dependent on the loved one.  You have a history of depression. SIGNS AND SYMPTOMS Signs and symptoms of complicated grief may include:  Feeling disbelief or numbness.  Being unable to enjoy good memories of your loved one.  Needing to avoid anything that reminds you of your loved one.  Being unable to stop thinking about the death.  Feeling intense anger or guilt.  Feeling alone and hopeless.  Feeling that your life is meaningless and empty.  Losing the desire to live. DIAGNOSIS Your health care provider may diagnose complicated grief if:  You have constant symptoms of grief for 6-12 months or longer.  Your symptoms are interfering with your ability to live your life. Your health care provider may want you to see a mental health care provider. Many symptoms of depression are similar to the symptoms of complicated grief. It is important to be evaluated for complicated grief along with other mental health conditions. TREATMENT  Talk therapy with a mental health provider is the most common treatment for complicated grief. During therapy, you will learn healthy ways to cope with the loss of your loved one. In some cases, your mental health care provider may also recommend antidepressant medicines. HOME CARE INSTRUCTIONS  Take care of  yourself.  Eat regular meals and maintain a healthy diet. Eat plenty of fruits, vegetables, and whole grains.  Try to get some exercise each day.  Keep regular hours for sleep. Try to get at least 8 hours of sleep each night.  Do not use drugs or alcohol to ease your symptoms.  Take medicines only as directed by your health care provider.  Spend time with friends and loved ones.  Consider joining a grief (bereavement) support group to help you deal with your loss.  Keep all follow-up visits as directed by your health care provider. This is important. SEEK MEDICAL CARE IF:  Your symptoms keep you from functioning normally.  Your symptoms do not get better with treatment. SEEK IMMEDIATE MEDICAL CARE IF:  You have serious thoughts of hurting yourself or someone else.  You have suicidal feelings.   This information is not intended to replace advice given to you by your health care provider. Make sure you discuss any questions you have with your health care provider.   Document Released: 11/25/2005 Document Revised: 08/16/2015 Document Reviewed: 05/05/2014 Elsevier Interactive Patient Education 2016 [place] for Type 2 Diabetes Screening is a way to check for type 2 diabetes in people who do not have symptoms of the disease, but who may likely develop diabetes in the future. Diabetes can lead to serious health problems, but finding diabetes early allows for early treatment. DIABETES RISK FACTORS   Family history of diabetes.  Diseases of the pancreas.  Obesity or being overweight.  Certain racial or ethnic groups:  American [place].  Pacific Islander.  Hispanic.  Asian.  African American.  High blood pressure (hypertension).  History of diabetes while pregnant (gestational diabetes).  Delivering a baby that weighed over 9 pounds.  Being inactive.  High cholesterol or triglycerides.  Age, especially over 21 years of age.  Other diseases  or conditions.  Diseases of the pancreas.  Cardiovascular disease.  Disorders of the endocrine system.  Certain medicines, such as those that treat high blood cholesterol levels. WHO IS SCREENED Adults  Adults who have no risk factors and no symptoms should be screened starting at age 14. If the screening tests are normal, they should be repeated every 3 years.  Adults who do not have symptoms, but have 1 or more risk factors, should be screened.  Adults who have 2 or more risk factors may be screened every year.  Adults who have an A1c (3 month average of blood glucose) greater than 5.7% or who had an impaired glucose tolerance (IGT) or impaired fasting glucose (IFG) on a previous test should be screened.  Pregnant women who have risk factors should be screened at their first prenatal visit.  Women who have given birth and had gestational diabetes should be screened 6-12 weeks after the child is born. This screening should be repeated every  1-3 years after the first test. Children or Adolescents  Children and adolescents should be screened for type 2 diabetes if they are overweight and have 2 of the following risk factors:  Having a family history of type 2 diabetes.  Being a member of a high risk race or ethnic group.  Having signs of insulin resistance or conditions associated with insulin resistance.  Having a mother who had gestational diabetes while pregnant with him or her.  Screening should start at age 24 or at the onset of puberty, whichever comes first. This should be repeated every 2 years. SCREENING In a screening, your caregiver may:  Ask questions about your overall health. This will include questions about the health of close family members, too.  Ask about any diabetes-like symptoms you may have.  Perform a physical exam.  Order some tests that may include:  A fasting plasma glucose test. This measures the level of glucose in your blood. It is done  after you have had nothing to eat but water (fasted) for 8 hours.  A random blood glucose test. This test is done without the need to fast.  An oral glucose tolerance test. This is a blood test done in 2 parts. First, a blood sample is taken after you have fasted. Then, another sample is taken after you drink a liquid that contains a lot of sugar.  An A1c test. This test shows how much glucose has been in your blood over the past 2 to 3 months.   This information is not intended to replace advice given to you by your health care provider. Make sure you discuss any questions you have with your health care provider.   Document Released: 09/21/2009 Document Revised: 12/16/2014 Document Reviewed: 07/03/2011 Elsevier Interactive Patient Education 2016 [place] Maintenance, Female Adopting a healthy lifestyle and getting preventive care can go a long way to promote health and wellness. Talk with your health care provider about what schedule of regular examinations is right for you. This is a good chance for you to check in with your provider about disease prevention and staying healthy. In between checkups, there are plenty of things you can do on your own. Experts have done a lot of research about which lifestyle changes and preventive measures are most likely to keep you healthy. Ask your health care provider for more information. WEIGHT AND DIET  Eat a healthy diet  Be sure to include plenty of vegetables, fruits, low-fat dairy products, and lean protein.  Do not eat a lot of foods high in solid fats, added sugars, or salt.  Get regular exercise. This is one of the most important things you can do for your health.  Most adults should exercise for at least 150 minutes each week. The exercise should increase your heart rate and make you sweat (moderate-intensity exercise).  Most adults should also do strengthening exercises at least twice a week. This is in addition to the  moderate-intensity exercise.  Maintain a healthy weight  Body mass index (BMI) is a measurement that can be used to identify possible weight problems. It estimates body fat based on height and weight. Your health care provider can help determine your BMI and help you achieve or maintain a healthy weight.  For females 19 years of age and older:   A BMI below 18.5 is considered underweight.  A BMI of 18.5 to 24.9 is normal.  A BMI of 25 to 29.9 is considered overweight.  A  BMI of 30 and above is considered obese.  Watch levels of cholesterol and blood lipids  You should start having your blood tested for lipids and cholesterol at 71 years of age, then have this test every 5 years.  You may need to have your cholesterol levels checked more often if:  Your lipid or cholesterol levels are high.  You are older than 71 years of age.  You are at high risk for heart disease.  CANCER SCREENING   Lung Cancer  Lung cancer screening is recommended for adults 72-9 years old who are at high risk for lung cancer because of a history of smoking.  A yearly low-dose CT scan of the lungs is recommended for people who:  Currently smoke.  Have quit within the past 15 years.  Have at least a 30-pack-year history of smoking. A pack year is smoking an average of one pack of cigarettes a day for 1 year.  Yearly screening should continue until it has been 15 years since you quit.  Yearly screening should stop if you develop a health problem that would prevent you from having lung cancer treatment.  Breast Cancer  Practice breast self-awareness. This means understanding how your breasts normally appear and feel.  It also means doing regular breast self-exams. Let your health care provider know about any changes, no matter how small.  If you are in your 20s or 30s, you should have a clinical breast exam (CBE) by a health care provider every 1-3 years as part of a regular health exam.  If  you are 37 or older, have a CBE every year. Also consider having a breast X-ray (mammogram) every year.  If you have a family history of breast cancer, talk to your health care provider about genetic screening.  If you are at high risk for breast cancer, talk to your health care provider about having an MRI and a mammogram every year.  Breast cancer gene (BRCA) assessment is recommended for women who have family members with BRCA-related cancers. BRCA-related cancers include:  Breast.  Ovarian.  Tubal.  Peritoneal cancers.  Results of the assessment will determine the need for genetic counseling and BRCA1 and BRCA2 testing. Cervical Cancer Your health care provider may recommend that you be screened regularly for cancer of the pelvic organs (ovaries, uterus, and vagina). This screening involves a pelvic examination, including checking for microscopic changes to the surface of your cervix (Pap test). You may be encouraged to have this screening done every 3 years, beginning at age 54.  For women ages 63-65, health care providers may recommend pelvic exams and Pap testing every 3 years, or they may recommend the Pap and pelvic exam, combined with testing for human papilloma virus (HPV), every 5 years. Some types of HPV increase your risk of cervical cancer. Testing for HPV may also be done on women of any age with unclear Pap test results.  Other health care providers may not recommend any screening for nonpregnant women who are considered low risk for pelvic cancer and who do not have symptoms. Ask your health care provider if a screening pelvic exam is right for you.  If you have had past treatment for cervical cancer or a condition that could lead to cancer, you need Pap tests and screening for cancer for at least 20 years after your treatment. If Pap tests have been discontinued, your risk factors (such as having a new sexual partner) need to be reassessed to determine if screening  should  resume. Some women have medical problems that increase the chance of getting cervical cancer. In these cases, your health care provider may recommend more frequent screening and Pap tests. Colorectal Cancer  This type of cancer can be detected and often prevented.  Routine colorectal cancer screening usually begins at 71 years of age and continues through 71 years of age.  Your health care provider may recommend screening at an earlier age if you have risk factors for colon cancer.  Your health care provider may also recommend using home test kits to check for hidden blood in the stool.  A small camera at the end of a tube can be used to examine your colon directly (sigmoidoscopy or colonoscopy). This is done to check for the earliest forms of colorectal cancer.  Routine screening usually begins at age 70.  Direct examination of the colon should be repeated every 5-10 years through 71 years of age. However, you may need to be screened more often if early forms of precancerous polyps or small growths are found. Skin Cancer  Check your skin from head to toe regularly.  Tell your health care provider about any new moles or changes in moles, especially if there is a change in a mole's shape or color.  Also tell your health care provider if you have a mole that is larger than the size of a pencil eraser.  Always use sunscreen. Apply sunscreen liberally and repeatedly throughout the day.  Protect yourself by wearing long sleeves, pants, a wide-brimmed hat, and sunglasses whenever you are outside. HEART DISEASE, DIABETES, AND HIGH BLOOD PRESSURE   High blood pressure causes heart disease and increases the risk of stroke. High blood pressure is more likely to develop in:  People who have blood pressure in the high end of the normal range (130-139/85-89 mm Hg).  People who are overweight or obese.  People who are African American.  If you are 42-66 years of age, have your blood pressure  checked every 3-5 years. If you are 55 years of age or older, have your blood pressure checked every year. You should have your blood pressure measured twice--once when you are at a hospital or clinic, and once when you are not at a hospital or clinic. Record the average of the two measurements. To check your blood pressure when you are not at a hospital or clinic, you can use:  An automated blood pressure machine at a pharmacy.  A home blood pressure monitor.  If you are between 15 years and 85 years old, ask your health care provider if you should take aspirin to prevent strokes.  Have regular diabetes screenings. This involves taking a blood sample to check your fasting blood sugar level.  If you are at a normal weight and have a low risk for diabetes, have this test once every three years after 71 years of age.  If you are overweight and have a high risk for diabetes, consider being tested at a younger age or more often. PREVENTING INFECTION  Hepatitis B  If you have a higher risk for hepatitis B, you should be screened for this virus. You are considered at high risk for hepatitis B if:  You were born in a country where hepatitis B is common. Ask your health care provider which countries are considered high risk.  Your parents were born in a high-risk country, and you have not been immunized against hepatitis B (hepatitis B vaccine).  You have HIV or  AIDS.  You use needles to inject street drugs.  You live with someone who has hepatitis B.  You have had sex with someone who has hepatitis B.  You get hemodialysis treatment.  You take certain medicines for conditions, including cancer, organ transplantation, and autoimmune conditions. Hepatitis C  Blood testing is recommended for:  Everyone born from [birth year] through 1966.  Anyone with known risk factors for hepatitis C. Sexually transmitted infections (STIs)  You should be screened for sexually transmitted infections (STIs)  including gonorrhea and chlamydia if:  You are sexually active and are younger than 71 years of age.  You are older than 71 years of age and your health care provider tells you that you are at risk for this type of infection.  Your sexual activity has changed since you were last screened and you are at an increased risk for chlamydia or gonorrhea. Ask your health care provider if you are at risk.  If you do not have HIV, but are at risk, it may be recommended that you take a prescription medicine daily to prevent HIV infection. This is called pre-exposure prophylaxis (PrEP). You are considered at risk if:  You are sexually active and do not regularly use condoms or know the HIV status of your partner(s).  You take drugs by injection.  You are sexually active with a partner who has HIV. Talk with your health care provider about whether you are at high risk of being infected with HIV. If you choose to begin PrEP, you should first be tested for HIV. You should then be tested every 3 months for as long as you are taking PrEP.  PREGNANCY   If you are premenopausal and you may become pregnant, ask your health care provider about preconception counseling.  If you may become pregnant, take 400 to 800 micrograms (mcg) of folic acid every day.  If you want to prevent pregnancy, talk to your health care provider about birth control (contraception). OSTEOPOROSIS AND MENOPAUSE   Osteoporosis is a disease in which the bones lose minerals and strength with aging. This can result in serious bone fractures. Your risk for osteoporosis can be identified using a bone density scan.  If you are 45 years of age or older, or if you are at risk for osteoporosis and fractures, ask your health care provider if you should be screened.  Ask your health care provider whether you should take a calcium or vitamin D supplement to lower your risk for osteoporosis.  Menopause may have certain physical symptoms and  risks.  Hormone replacement therapy may reduce some of these symptoms and risks. Talk to your health care provider about whether hormone replacement therapy is right for you.  HOME CARE INSTRUCTIONS   Schedule regular health, dental, and eye exams.  Stay current with your immunizations.   Do not use any tobacco products including cigarettes, chewing tobacco, or electronic cigarettes.  If you are pregnant, do not drink alcohol.  If you are breastfeeding, limit how much and how often you drink alcohol.  Limit alcohol intake to no more than 1 drink per day for nonpregnant women. One drink equals 12 ounces of beer, 5 ounces of wine, or 1 ounces of hard liquor.  Do not use street drugs.  Do not share needles.  Ask your health care provider for help if you need support or information about quitting drugs.  Tell your health care provider if you often feel depressed.  Tell your health care provider  if you have ever been abused or do not feel safe at home.   This information is not intended to replace advice given to you by your health care provider. Make sure you discuss any questions you have with your health care provider.   Document Released: 06/10/2011 Document Revised: 12/16/2014 Document Reviewed: 10/27/2013 Elsevier Interactive Patient Education 2016 [place] A mammogram is an X-ray of the breasts that is done to check for abnormal changes. This procedure can screen for and detect any changes that may suggest breast cancer. A mammogram can also identify other changes and variations in the breast, such as:  Inflammation of the breast tissue (mastitis).  An infected area that contains a collection of pus (abscess).  A fluid-filled sac (cyst).  Fibrocystic changes. This is when breast tissue becomes denser, which can make the tissue feel rope-like or uneven under the skin.  Tumors that are not cancerous (benign). LET [facility] CARE PROVIDER KNOW  ABOUT:  Any allergies you have.  If you have breast implants.  If you have had previous breast disease, biopsy, or surgery.  If you are breastfeeding.  Any possibility that you could be pregnant, if this applies.  If you are younger than age 16.  If you have a family history of breast cancer. RISKS AND COMPLICATIONS Generally, this is a safe procedure. However, problems may occur, including:  Exposure to radiation. Radiation levels are very low with this test.  The results being misinterpreted.  The need for further tests.  The inability of the mammogram to detect certain cancers. BEFORE THE PROCEDURE  Schedule your test about 1-2 weeks after your menstrual period. This is usually when your breasts are the least tender.  If you have had a mammogram done at a different facility in the past, get the mammogram X-rays or have them sent to your current exam facility in order to compare them.  Wash your breasts and under your arms the day of the test.  Do not wear deodorants, perfumes, lotions, or powders anywhere on your body on the day of the test.  Remove any jewelry from your neck.  Wear clothes that you can change into and out of easily. PROCEDURE  You will undress from the waist up and put on a gown.  You will stand in front of the X-ray machine.  Each breast will be placed between two plastic or glass plates. The plates will compress your breast for a few seconds. Try to stay as relaxed as possible during the procedure. This does not cause any harm to your breasts and any discomfort you feel will be very brief.  X-rays will be taken from different angles of each breast. The procedure may vary among health care providers and hospitals. AFTER THE PROCEDURE  The mammogram will be examined by a specialist (radiologist).  You may need to repeat certain parts of the test, depending on the quality of the images. This is commonly done if the radiologist needs a better  view of the breast tissue.  Ask when your test results will be ready. Make sure you get your test results.  You may resume your normal activities.   This information is not intended to replace advice given to you by your health care provider. Make sure you discuss any questions you have with your health care provider.   Document Released: 11/22/2000 Document Revised: 08/16/2015 Document Reviewed: 02/03/2015 Elsevier Interactive Patient Education [employer].

## 2015-12-22 ENCOUNTER — Other Ambulatory Visit (INDEPENDENT_AMBULATORY_CARE_PROVIDER_SITE_OTHER): Payer: Medicare Other

## 2015-12-22 ENCOUNTER — Encounter: Payer: Self-pay | Admitting: Internal Medicine

## 2015-12-22 ENCOUNTER — Ambulatory Visit (INDEPENDENT_AMBULATORY_CARE_PROVIDER_SITE_OTHER): Payer: Medicare Other | Admitting: Internal Medicine

## 2015-12-22 VITALS — BP 118/72 | HR 76 | Temp 98.1°F | Ht 66.0 in | Wt 258.0 lb

## 2015-12-22 DIAGNOSIS — Z0000 Encounter for general adult medical examination without abnormal findings: Secondary | ICD-10-CM

## 2015-12-22 DIAGNOSIS — R7302 Impaired glucose tolerance (oral): Secondary | ICD-10-CM

## 2015-12-22 DIAGNOSIS — R609 Edema, unspecified: Secondary | ICD-10-CM | POA: Diagnosis not present

## 2015-12-22 DIAGNOSIS — R7989 Other specified abnormal findings of blood chemistry: Secondary | ICD-10-CM | POA: Diagnosis not present

## 2015-12-22 DIAGNOSIS — G4710 Hypersomnia, unspecified: Secondary | ICD-10-CM | POA: Insufficient documentation

## 2015-12-22 DIAGNOSIS — F329 Major depressive disorder, single episode, unspecified: Secondary | ICD-10-CM

## 2015-12-22 DIAGNOSIS — Z23 Encounter for immunization: Secondary | ICD-10-CM | POA: Diagnosis not present

## 2015-12-22 DIAGNOSIS — F32A Depression, unspecified: Secondary | ICD-10-CM

## 2015-12-22 LAB — CBC WITH DIFFERENTIAL/PLATELET
BASOS ABS: 0.1 10*3/uL (ref 0.0–0.1)
BASOS PCT: 0.5 % (ref 0.0–3.0)
EOS PCT: 1.7 % (ref 0.0–5.0)
Eosinophils Absolute: 0.2 10*3/uL (ref 0.0–0.7)
HEMATOCRIT: 42.5 % (ref 36.0–46.0)
Hemoglobin: 14.4 g/dL (ref 12.0–15.0)
LYMPHS PCT: 38.7 % (ref 12.0–46.0)
Lymphs Abs: 4.8 10*3/uL — ABNORMAL HIGH (ref 0.7–4.0)
MCHC: 34 g/dL (ref 30.0–36.0)
MCV: 90.1 fl (ref 78.0–100.0)
MONOS PCT: 7.8 % (ref 3.0–12.0)
Monocytes Absolute: 1 10*3/uL (ref 0.1–1.0)
NEUTROS ABS: 6.4 10*3/uL (ref 1.4–7.7)
Neutrophils Relative %: 51.3 % (ref 43.0–77.0)
PLATELETS: 332 10*3/uL (ref 150.0–400.0)
RBC: 4.71 Mil/uL (ref 3.87–5.11)
RDW: 12.7 % (ref 11.5–15.5)
WBC: 12.5 10*3/uL — ABNORMAL HIGH (ref 4.0–10.5)

## 2015-12-22 LAB — BASIC METABOLIC PANEL
BUN: 21 mg/dL (ref 6–23)
CALCIUM: 9.4 mg/dL (ref 8.4–10.5)
CO2: 24 mEq/L (ref 19–32)
CREATININE: 0.83 mg/dL (ref 0.40–1.20)
Chloride: 105 mEq/L (ref 96–112)
GFR: 72.2 mL/min (ref >60.00)
Glucose, Bld: 128 mg/dL — ABNORMAL HIGH (ref 70–99)
Potassium: 4 mEq/L (ref 3.5–5.1)
SODIUM: 138 meq/L (ref 135–145)

## 2015-12-22 LAB — HEMOGLOBIN A1C: Hgb A1c MFr Bld: 8 % — ABNORMAL HIGH (ref 4.6–6.5)

## 2015-12-22 LAB — URINALYSIS, ROUTINE W REFLEX MICROSCOPIC
Bilirubin Urine: NEGATIVE
HGB URINE DIPSTICK: NEGATIVE
KETONES UR: NEGATIVE
LEUKOCYTES UA: NEGATIVE
NITRITE: NEGATIVE
SPECIFIC GRAVITY, URINE: 1.025 (ref 1.000–1.030)
Total Protein, Urine: NEGATIVE
Urine Glucose: NEGATIVE
Urobilinogen, UA: 0.2 (ref 0.0–1.0)
pH: 6 (ref 5.0–8.0)

## 2015-12-22 LAB — HEPATIC FUNCTION PANEL
ALT: 27 U/L (ref 0–35)
AST: 40 U/L — ABNORMAL HIGH (ref 0–37)
Albumin: 4.1 g/dL (ref 3.5–5.2)
Alkaline Phosphatase: 88 U/L (ref 39–117)
BILIRUBIN DIRECT: 0.1 mg/dL (ref 0.0–0.3)
BILIRUBIN TOTAL: 0.4 mg/dL (ref 0.2–1.2)
Total Protein: 6.8 g/dL (ref 6.0–8.3)

## 2015-12-22 LAB — LIPID PANEL
CHOL/HDL RATIO: 4
Cholesterol: 154 mg/dL (ref 0–200)
HDL: 36.8 mg/dL — AB (ref >39.00)
NONHDL: 117
TRIGLYCERIDES: 245 mg/dL — AB (ref 0.0–149.0)
VLDL: 49 mg/dL — ABNORMAL HIGH (ref 0.0–40.0)

## 2015-12-22 LAB — LDL CHOLESTEROL, DIRECT: LDL DIRECT: 92 mg/dL

## 2015-12-22 LAB — TSH: TSH: 4.86 u[IU]/mL — ABNORMAL HIGH (ref 0.35–4.50)

## 2015-12-22 MED ORDER — HYDROCHLOROTHIAZIDE 25 MG PO TABS: 12.5000 mg | ORAL_TABLET | Freq: Every day | ORAL | Status: DC | PRN

## 2015-12-22 NOTE — Progress Notes (Signed)
Subjective:    Patient ID: Alexandra Wolfe, female    DOB: 1946-02-21, 70 y.o.   MRN: [id number]  HPI  Here for wellness and f/u;  Overall doing ok;  Pt denies Chest pain, worsening SOB, DOE, wheezing, orthopnea, PND, palpitations, dizziness or syncope, except for mild pedal edema worsening in the past few months, goes away at night with legs elevated, returns later in the next day.   Pt denies neurological change such as new headache, facial or extremity weakness.  Pt denies polydipsia, polyuria, or low sugar symptoms. Pt states overall good compliance with treatment and medications, good tolerability, and has been trying to follow appropriate diet.  Pt denies worsening depressive symptoms, suicidal ideation or panic. No fever, night sweats, wt loss, loss of appetite, or other constitutional symptoms.  Pt states good ability with ADL's, has low fall risk, home safety reviewed and adequate, no other significant changes in hearing or vision, and only occasionally active with exercise. Has personal trainer and trying to do more. Has gained wt due to less activity and dietary indiscretion.  Now with marked fatigue and daily hypersomnolence such that she can take naps ever 2 hrs after getting up in the AM.  Wt Readings from Last 3 Encounters:  12/22/15 258 lb (117.028 kg)  12/19/15 258 lb (117.028 kg)  03/01/15 236 lb 12.8 oz (107.412 kg)  Husband died 05-08-24with hemorrhagic stroike/dementia.  Due for flu shot Past Medical History  Diagnosis Date  . HYPERLIPIDEMIA 03/11/2008  . COLONIC POLYPS, HX OF 09/13/2007  . DEPRESSION 09/13/2007  . OSTEOARTHROSIS NOS, LOWER LEG 09/13/2007  . Chronic LBP   . BACK PAIN 09/27/2008  . BUNIONS, BILATERAL 12/23/2007  . CHEST DISCOMFORT, ATYPICAL 11/07/2009  . CONSTIPATION 09/27/2008  . DYSPNEA ON EXERTION 02/06/2010  . Eustachian tube dysfunction 05/20/2011  . FOOT PAIN, RIGHT 09/27/2008  . LBP (low back pain) 05/20/2011  . SHOULDER PAIN, LEFT 02/14/2009  . SYMPTOM,  PALPITATIONS 09/13/2007  . URINARY INCONTINENCE 08/23/2009  . Vertigo 05/20/2011  . Leukocytosis 11/20/2011  . SVT (supraventricular tachycardia) ([place])   . Diabetes mellitus     diet controlled  . Anxiety   . ALLERGIC RHINITIS 04/02/2009    no per pt   Past Surgical History  Procedure Laterality Date  . Ccx    . Lumbar laminectomy    . Cholecystectomy    . Lumbar laminectomy    . Bunionectomy      right  . Shoulder arthroscopy  12/13/2011    Procedure: ARTHROSCOPY SHOULDER;  Surgeon: Augustin Schooling;  Location: [place];  Service: Orthopedics;  Laterality: Left;  Left Shoulder ArthroscopyDebridement Limited Tenodesis Open Rotator Cuff Repair Spur Removal Right Shoulder Injection     reports that she has quit smoking. She has never used smokeless tobacco. She reports that she does not drink alcohol or use illicit drugs. family history includes Colon cancer in her mother; Diabetes in her other; Diabetes type II in her father; Heart disease in her brother and father. There is no history of Esophageal cancer, Stomach cancer, or Rectal cancer. Allergies  Allergen Reactions  . Aripiprazole     REACTION: agitation  . Simvastatin     REACTION: myalgia   Current Outpatient Prescriptions on File Prior to Visit  Medication Sig Dispense Refill  . aspirin 81 MG tablet Take 81 mg by mouth daily.      [name] atenolol (TENORMIN) 25 MG tablet TAKE 1 TABLET BY MOUTH DAILY 90 tablet 1  .  calcium-vitamin D (OSCAL WITH D) 500-200 MG-UNIT tablet Take 4 tablets by mouth.     . cetirizine (ZYRTEC) 10 MG tablet Take 1 tablet (10 mg total) by mouth daily. 30 tablet 11  . fluticasone (FLONASE) 50 MCG/ACT nasal spray Place 2 sprays into the nose daily as needed. For allergies 16 g 11  . meclizine (ANTIVERT) 25 MG tablet Take 1 tablet (25 mg total) by mouth 3 (three) times daily as needed for dizziness. 50 tablet 1  . ondansetron (ZOFRAN ODT) 8 MG disintegrating tablet Take 1 tablet (8 mg total) by mouth every 8 (eight)  hours as needed for nausea or vomiting. 40 tablet 0  . Cholecalciferol (VITAMIN D) 2000 UNITS tablet Take 2,000 Units by mouth daily. Reported on 12/22/2015     No current facility-administered medications on file prior to visit.    Review of Systems Constitutional: Negative for increased diaphoresis, other activity, appetite or siginficant weight change other than noted HENT: Negative for worsening hearing loss, ear pain, facial swelling, mouth sores and neck stiffness.   Eyes: Negative for other worsening pain, redness or visual disturbance.  Respiratory: Negative for shortness of breath and wheezing  Cardiovascular: Negative for chest pain and palpitations.  Gastrointestinal: Negative for diarrhea, blood in stool, abdominal distention or other pain Genitourinary: Negative for hematuria, flank pain or change in urine volume.  Musculoskeletal: Negative for myalgias or other joint complaints.  Skin: Negative for color change and wound or drainage.  Neurological: Negative for syncope and numbness. other than noted Hematological: Negative for adenopathy. or other swelling Psychiatric/Behavioral: Negative for hallucinations, SI, self-injury, decreased concentration or other worsening agitation.      Objective:   Physical Exam BP 118/72 mmHg  Pulse 76  Temp(Src) 98.1 F (36.7 C) (Oral)  Ht 5\' 6"  (1.676 m)  Wt 258 lb (117.028 kg)  BMI 41.66 kg/m2  SpO2 96% VS noted,  Constitutional: Pt is oriented to person, place, and time. Appears well-developed and well-nourished, in no significant distress Head: Normocephalic and atraumatic.  Right Ear: External ear normal.  Left Ear: External ear normal.  Nose: Nose normal.  Mouth/Throat: Oropharynx is clear and moist.  Eyes: Conjunctivae and EOM are normal. Pupils are equal, round, and reactive to light.  Neck: Normal range of motion. Neck supple. No JVD present. No tracheal deviation present or significant neck LA or mass Cardiovascular:  Normal rate, regular rhythm, normal heart sounds and intact distal pulses.   Pulmonary/Chest: Effort normal and breath sounds without rales or wheezing  Abdominal: Soft. Bowel sounds are normal. NT. No HSM  Musculoskeletal: Normal range of motion. Exhibits trace bilat pedal edema.  Lymphadenopathy:  Has no cervical adenopathy.  Neurological: Pt is alert and oriented to person, place, and time. Pt has normal reflexes. No cranial nerve deficit. Motor grossly intact Skin: Skin is warm and dry. No rash noted. , several varicosities noted Psychiatric:  Has mild dysphoric mood and affect. Behavior is normal.     Assessment & Plan:

## 2015-12-22 NOTE — Progress Notes (Signed)
Pre visit review using our clinic review tool, if applicable. No additional management support is needed unless otherwise documented below in the visit note. 

## 2015-12-22 NOTE — Patient Instructions (Signed)
Please take all new medication as prescribed - the HCT (mild fluid pill)  Please continue all other medications as before, and refills have been done if requested.  Please have the pharmacy call with any other refills you may need.  Please continue your efforts at being more active, low cholesterol diet, and weight control.  You are otherwise up to date with prevention measures today.  Please keep your appointments with your specialists as you may have planned  You will be contacted regarding the referral for: pulmonary to see about sleep apnea  Please go to the LAB in the Basement (turn left off the elevator) for the tests to be done today  You will be contacted by phone if any changes need to be made immediately.  Otherwise, you will receive a letter about your results with an explanation, but please check with MyChart first.  Please remember to sign up for MyChart if you have not done so, as this will be important to you in the future with finding out test results, communicating by private email, and scheduling acute appointments online when needed.  Please return in 6 months, or sooner if needed

## 2015-12-23 ENCOUNTER — Other Ambulatory Visit: Payer: Self-pay | Admitting: Internal Medicine

## 2015-12-23 ENCOUNTER — Encounter: Payer: Self-pay | Admitting: Internal Medicine

## 2015-12-23 DIAGNOSIS — R609 Edema, unspecified: Secondary | ICD-10-CM | POA: Insufficient documentation

## 2015-12-23 LAB — HEPATITIS C ANTIBODY: HCV Ab: NEGATIVE

## 2015-12-23 MED ORDER — ATORVASTATIN CALCIUM 40 MG PO TABS: 40.0000 mg | ORAL_TABLET | Freq: Every day | ORAL | Status: DC

## 2015-12-23 MED ORDER — METFORMIN HCL ER 500 MG PO TB24: 500.0000 mg | ORAL_TABLET | Freq: Every day | ORAL | Status: DC

## 2015-12-23 MED ORDER — LEVOTHYROXINE SODIUM 50 MCG PO TABS: 50.0000 ug | ORAL_TABLET | Freq: Every day | ORAL | Status: DC

## 2015-12-23 NOTE — Assessment & Plan Note (Signed)
C/w higher suspicion for new onset osa - for pulm referral, may need sleep testing

## 2015-12-23 NOTE — Assessment & Plan Note (Signed)
liekly related to venous insuff, for hct 12.5 qd prn, leg elevation, avoid salt, wt loss

## 2015-12-23 NOTE — Assessment & Plan Note (Signed)

## 2015-12-23 NOTE — Assessment & Plan Note (Signed)
stable overall by history and exam, and pt to continue medical treatment as before,  to f/u any worsening symptoms or concerns 

## 2015-12-23 NOTE — Assessment & Plan Note (Signed)
stable overall by history and exam, and pt to continue medical treatment as before,  to f/u any worsening symptoms or concerns, may need increased OHA for worsening a1c related to wt gain, f/u lab next visit

## 2016-02-27 ENCOUNTER — Other Ambulatory Visit (INDEPENDENT_AMBULATORY_CARE_PROVIDER_SITE_OTHER): Payer: Medicare Other

## 2016-02-27 DIAGNOSIS — R7302 Impaired glucose tolerance (oral): Secondary | ICD-10-CM

## 2016-02-27 DIAGNOSIS — R7989 Other specified abnormal findings of blood chemistry: Secondary | ICD-10-CM | POA: Diagnosis not present

## 2016-02-27 DIAGNOSIS — Z0000 Encounter for general adult medical examination without abnormal findings: Secondary | ICD-10-CM | POA: Diagnosis not present

## 2016-02-27 LAB — URINALYSIS, ROUTINE W REFLEX MICROSCOPIC
Bilirubin Urine: NEGATIVE
Hgb urine dipstick: NEGATIVE
KETONES UR: NEGATIVE
Leukocytes, UA: NEGATIVE
Nitrite: NEGATIVE
PH: 6 (ref 5.0–8.0)
RBC / HPF: NONE SEEN (ref 0–?)
SPECIFIC GRAVITY, URINE: 1.01 (ref 1.000–1.030)
TOTAL PROTEIN, URINE-UPE24: NEGATIVE
URINE GLUCOSE: NEGATIVE
UROBILINOGEN UA: 0.2 (ref 0.0–1.0)
WBC, UA: NONE SEEN (ref 0–?)

## 2016-02-27 LAB — HEMOGLOBIN A1C: HEMOGLOBIN A1C: 8 % — AB (ref 4.6–6.5)

## 2016-02-27 LAB — HEPATIC FUNCTION PANEL
ALBUMIN: 4.1 g/dL (ref 3.5–5.2)
ALK PHOS: 93 U/L (ref 39–117)
ALT: 18 U/L (ref 0–35)
AST: 17 U/L (ref 0–37)
BILIRUBIN DIRECT: 0.1 mg/dL (ref 0.0–0.3)
TOTAL PROTEIN: 6.8 g/dL (ref 6.0–8.3)
Total Bilirubin: 0.6 mg/dL (ref 0.2–1.2)

## 2016-02-27 LAB — CBC WITH DIFFERENTIAL/PLATELET
BASOS ABS: 0.1 10*3/uL (ref 0.0–0.1)
Basophils Relative: 0.4 % (ref 0.0–3.0)
EOS ABS: 0.2 10*3/uL (ref 0.0–0.7)
Eosinophils Relative: 1.3 % (ref 0.0–5.0)
HEMATOCRIT: 42.4 % (ref 36.0–46.0)
Hemoglobin: 14.4 g/dL (ref 12.0–15.0)
LYMPHS PCT: 35.4 % (ref 12.0–46.0)
Lymphs Abs: 4.9 10*3/uL — ABNORMAL HIGH (ref 0.7–4.0)
MCHC: 34 g/dL (ref 30.0–36.0)
MCV: 89.5 fl (ref 78.0–100.0)
MONO ABS: 0.9 10*3/uL (ref 0.1–1.0)
Monocytes Relative: 6.5 % (ref 3.0–12.0)
NEUTROS ABS: 7.8 10*3/uL — AB (ref 1.4–7.7)
Neutrophils Relative %: 56.4 % (ref 43.0–77.0)
PLATELETS: 346 10*3/uL (ref 150.0–400.0)
RBC: 4.74 Mil/uL (ref 3.87–5.11)
RDW: 12.8 % (ref 11.5–15.5)
WBC: 13.8 10*3/uL — AB (ref 4.0–10.5)

## 2016-02-27 LAB — BASIC METABOLIC PANEL
BUN: 14 mg/dL (ref 6–23)
CHLORIDE: 99 meq/L (ref 96–112)
CO2: 24 meq/L (ref 19–32)
Calcium: 9.7 mg/dL (ref 8.4–10.5)
Creatinine, Ser: 0.86 mg/dL (ref 0.40–1.20)
GFR: 69.26 mL/min (ref >60.00)
GLUCOSE: 247 mg/dL — AB (ref 70–99)
POTASSIUM: 3.8 meq/L (ref 3.5–5.1)
SODIUM: 134 meq/L — AB (ref 135–145)

## 2016-02-27 LAB — LDL CHOLESTEROL, DIRECT: Direct LDL: 69 mg/dL

## 2016-02-27 LAB — LIPID PANEL
Cholesterol: 128 mg/dL (ref 0–200)
HDL: 34 mg/dL — AB (ref >39.00)
NONHDL: 93.62
Total CHOL/HDL Ratio: 4
Triglycerides: 210 mg/dL — ABNORMAL HIGH (ref 0.0–149.0)
VLDL: 42 mg/dL — ABNORMAL HIGH (ref 0.0–40.0)

## 2016-02-27 LAB — TSH: TSH: 2.4 u[IU]/mL (ref 0.35–4.50)

## 2016-03-01 ENCOUNTER — Encounter: Payer: Self-pay | Admitting: Internal Medicine

## 2016-03-01 ENCOUNTER — Ambulatory Visit (INDEPENDENT_AMBULATORY_CARE_PROVIDER_SITE_OTHER): Payer: Medicare Other | Admitting: Internal Medicine

## 2016-03-01 VITALS — BP 122/74 | HR 75 | Temp 98.7°F | Resp 20 | Wt 255.0 lb

## 2016-03-01 DIAGNOSIS — E119 Type 2 diabetes mellitus without complications: Secondary | ICD-10-CM

## 2016-03-01 DIAGNOSIS — E785 Hyperlipidemia, unspecified: Secondary | ICD-10-CM

## 2016-03-01 DIAGNOSIS — F32A Depression, unspecified: Secondary | ICD-10-CM

## 2016-03-01 DIAGNOSIS — F329 Major depressive disorder, single episode, unspecified: Secondary | ICD-10-CM

## 2016-03-01 DIAGNOSIS — Z0000 Encounter for general adult medical examination without abnormal findings: Secondary | ICD-10-CM

## 2016-03-01 MED ORDER — METFORMIN HCL ER 500 MG PO TB24: ORAL_TABLET | ORAL | Status: DC

## 2016-03-01 NOTE — Patient Instructions (Signed)
Ok to increase the metformin ER 500 mg to 2 pills in the AM  Please continue all other medications as before, and refills have been done if requested.  Please have the pharmacy call with any other refills you may need.  Please continue your efforts at being more active, low cholesterol diet, and weight control.  You are otherwise up to date with prevention measures today.  Please keep your appointments with your specialists as you may have planned  Please return in 6 months, or sooner if needed, with Lab testing done 3-5 days before

## 2016-03-01 NOTE — Progress Notes (Signed)
Pre visit review using our clinic review tool, if applicable. No additional management support is needed unless otherwise documented below in the visit note. 

## 2016-03-01 NOTE — Assessment & Plan Note (Signed)
stable overall by history and exam, recent data reviewed with pt, and pt to continue medical treatment as before,  to f/u any worsening symptoms or concerns Lab Results  Component Value Date   WBC 13.8* 02/27/2016   HGB 14.4 02/27/2016   HCT 42.4 02/27/2016   PLT 346.0 02/27/2016   GLUCOSE 247* 02/27/2016   CHOL 128 02/27/2016   TRIG 210.0* 02/27/2016   HDL 34.00* 02/27/2016   LDLDIRECT 69.0 02/27/2016   LDLCALC 78 03/01/2015   ALT 18 02/27/2016   AST 17 02/27/2016   NA 134* 02/27/2016   K 3.8 02/27/2016   CL 99 02/27/2016   CREATININE 0.86 02/27/2016   BUN 14 02/27/2016   CO2 24 02/27/2016   TSH 2.40 02/27/2016   INR 1.0 ratio 11/13/2009   HGBA1C 8.0* 02/27/2016

## 2016-03-01 NOTE — Assessment & Plan Note (Signed)

## 2016-03-01 NOTE — Assessment & Plan Note (Signed)
Mild uncontrolled , for cont;d wt loss efforts, diet and increaes the metformin to ER 500 mg - 2 in the am,  to f/u any worsening symptoms or concerns

## 2016-03-01 NOTE — Progress Notes (Signed)
Subjective:    Patient ID: Alexandra Wolfe, female    DOB: 1946/07/18, 70 y.o.   MRN: [id number]  HPI  Here for wellness and f/u;  Overall doing ok;  Pt denies Chest pain, worsening SOB, DOE, wheezing, orthopnea, PND, worsening LE edema, palpitations, dizziness or syncope.  Pt denies neurological change such as new headache, facial or extremity weakness.  Pt denies polydipsia, polyuria, or low sugar symptoms. Pt states overall good compliance with treatment and medications, good tolerability, and has been trying to follow appropriate diet.  Pt denies worsening depressive symptoms, suicidal ideation or panic. No fever, night sweats, wt loss, loss of appetite, or other constitutional symptoms.  Pt states good ability with ADL's, has low fall risk, home safety reviewed and adequate, no other significant changes in hearing or vision, and only occasionally active with exercise. Hard to lose wt, has a trainer at the gym 3 times per wk.  Denies worsening depressive symptoms, suicidal ideation, or panic Wt Readings from Last 3 Encounters:  03/01/16 255 lb (115.667 kg)  12/22/15 258 lb (117.028 kg)  12/19/15 258 lb (117.028 kg)   Past Medical History  Diagnosis Date  . HYPERLIPIDEMIA 03/11/2008  . COLONIC POLYPS, HX OF 09/13/2007  . DEPRESSION 09/13/2007  . OSTEOARTHROSIS NOS, LOWER LEG 09/13/2007  . Chronic LBP   . BACK PAIN 09/27/2008  . BUNIONS, BILATERAL 12/23/2007  . CHEST DISCOMFORT, ATYPICAL 11/07/2009  . CONSTIPATION 09/27/2008  . DYSPNEA ON EXERTION 02/06/2010  . Eustachian tube dysfunction 05/20/2011  . FOOT PAIN, RIGHT 09/27/2008  . LBP (low back pain) 05/20/2011  . SHOULDER PAIN, LEFT 02/14/2009  . SYMPTOM, PALPITATIONS 09/13/2007  . URINARY INCONTINENCE 08/23/2009  . Vertigo 05/20/2011  . Leukocytosis 11/20/2011  . SVT (supraventricular tachycardia) ([place])   . Diabetes mellitus     diet controlled  . Anxiety   . ALLERGIC RHINITIS 04/02/2009    no per pt   Past Surgical History  Procedure  Laterality Date  . Ccx    . Lumbar laminectomy    . Cholecystectomy    . Lumbar laminectomy    . Bunionectomy      right  . Shoulder arthroscopy  12/13/2011    Procedure: ARTHROSCOPY SHOULDER;  Surgeon: Augustin Schooling;  Location: [facility];  Service: Orthopedics;  Laterality: Left;  Left Shoulder ArthroscopyDebridement Limited Tenodesis Open Rotator Cuff Repair Spur Removal Right Shoulder Injection     reports that she has quit smoking. She has never used smokeless tobacco. She reports that she does not drink alcohol or use illicit drugs. family history includes Colon cancer in her mother; Diabetes in her other; Diabetes type II in her father; Heart disease in her brother and father. There is no history of Esophageal cancer, Stomach cancer, or Rectal cancer. Allergies  Allergen Reactions  . Aripiprazole     REACTION: agitation  . Simvastatin     REACTION: myalgia   Current Outpatient Prescriptions on File Prior to Visit  Medication Sig Dispense Refill  . aspirin 81 MG tablet Take 81 mg by mouth daily.      [name] atenolol (TENORMIN) 25 MG tablet TAKE 1 TABLET BY MOUTH DAILY 90 tablet 1  . atorvastatin (LIPITOR) 40 MG tablet Take 1 tablet (40 mg total) by mouth daily. 90 tablet 3  . calcium-vitamin D (OSCAL WITH D) 500-200 MG-UNIT tablet Take 4 tablets by mouth.     . cetirizine (ZYRTEC) 10 MG tablet Take 1 tablet (10 mg total) by mouth daily. [place]on  tablet 11  . Cholecalciferol (VITAMIN D) 2000 UNITS tablet Take 2,000 Units by mouth daily. Reported on 12/22/2015    . fluticasone (FLONASE) 50 MCG/ACT nasal spray Place 2 sprays into the nose daily as needed. For allergies 16 g 11  . hydrochlorothiazide (HYDRODIURIL) 25 MG tablet Take 0.5 tablets (12.5 mg total) by mouth daily as needed. 45 tablet 3  . levothyroxine (SYNTHROID, LEVOTHROID) 50 MCG tablet Take 1 tablet (50 mcg total) by mouth daily. 90 tablet 3  . meclizine (ANTIVERT) 25 MG tablet Take 1 tablet (25 mg total) by mouth 3 (three) times daily  as needed for dizziness. 50 tablet 1  . metFORMIN (GLUCOPHAGE-XR) 500 MG 24 hr tablet Take 1 tablet (500 mg total) by mouth daily with breakfast. 90 tablet 3  . ondansetron (ZOFRAN ODT) 8 MG disintegrating tablet Take 1 tablet (8 mg total) by mouth every 8 (eight) hours as needed for nausea or vomiting. 40 tablet 0   No current facility-administered medications on file prior to visit.     Review of Systems Constitutional: Negative for increased diaphoresis, other activity, appetite or siginficant weight change other than noted HENT: Negative for worsening hearing loss, ear pain, facial swelling, mouth sores and neck stiffness.   Eyes: Negative for other worsening pain, redness or visual disturbance.  Respiratory: Negative for shortness of breath and wheezing  Cardiovascular: Negative for chest pain and palpitations.  Gastrointestinal: Negative for diarrhea, blood in stool, abdominal distention or other pain Genitourinary: Negative for hematuria, flank pain or change in urine volume.  Musculoskeletal: Negative for myalgias or other joint complaints.  Skin: Negative for color change and wound or drainage.  Neurological: Negative for syncope and numbness. other than noted Hematological: Negative for adenopathy. or other swelling Psychiatric/Behavioral: Negative for hallucinations, SI, self-injury, decreased concentration or other worsening agitation.      Objective:   Physical Exam BP 122/74 mmHg  Pulse 75  Temp(Src) 98.7 F (37.1 C) (Oral)  Resp 20  Wt 255 lb (115.667 kg)  SpO2 97% VS noted,  Constitutional: Pt is oriented to person, place, and time. Appears well-developed and well-nourished, in no significant distress Head: Normocephalic and atraumatic.  Right Ear: External ear normal.  Left Ear: External ear normal.  Nose: Nose normal.  Mouth/Throat: Oropharynx is clear and moist.  Eyes: Conjunctivae and EOM are normal. Pupils are equal, round, and reactive to light.  Neck:  Normal range of motion. Neck supple. No JVD present. No tracheal deviation present or significant neck LA or mass Cardiovascular: Normal rate, regular rhythm, normal heart sounds and intact distal pulses.   Pulmonary/Chest: Effort normal and breath sounds without rales or wheezing  Abdominal: Soft. Bowel sounds are normal. NT. No HSM  Musculoskeletal: Normal range of motion. Exhibits no edema.  Lymphadenopathy:  Has no cervical adenopathy.  Neurological: Pt is alert and oriented to person, place, and time. Pt has normal reflexes. No cranial nerve deficit. Motor grossly intact Skin: Skin is warm and dry. No rash noted.  Psychiatric:  Has normal mood and affect. Behavior is normal. not depressed affect     Assessment & Plan:

## 2016-03-01 NOTE — Assessment & Plan Note (Signed)
stable overall by history and exam, recent data reviewed with pt, and pt to continue medical treatment as before,  to f/u any worsening symptoms or concerns Lab Results  Component Value Date   LDLCALC 78 03/01/2015

## 2016-03-15 ENCOUNTER — Other Ambulatory Visit: Payer: Self-pay | Admitting: Internal Medicine

## 2016-04-04 ENCOUNTER — Ambulatory Visit (INDEPENDENT_AMBULATORY_CARE_PROVIDER_SITE_OTHER): Payer: Medicare Other | Admitting: Internal Medicine

## 2016-04-04 ENCOUNTER — Other Ambulatory Visit (INDEPENDENT_AMBULATORY_CARE_PROVIDER_SITE_OTHER): Payer: Medicare Other

## 2016-04-04 ENCOUNTER — Encounter: Payer: Self-pay | Admitting: Internal Medicine

## 2016-04-04 VITALS — BP 124/68 | HR 99 | Ht 66.5 in | Wt 239.4 lb

## 2016-04-04 DIAGNOSIS — R0600 Dyspnea, unspecified: Secondary | ICD-10-CM

## 2016-04-04 DIAGNOSIS — G4733 Obstructive sleep apnea (adult) (pediatric): Secondary | ICD-10-CM

## 2016-04-04 DIAGNOSIS — I2699 Other pulmonary embolism without acute cor pulmonale: Secondary | ICD-10-CM | POA: Diagnosis not present

## 2016-04-04 LAB — CBC WITH DIFFERENTIAL/PLATELET
BASOS PCT: 0.4 % (ref 0.0–3.0)
Basophils Absolute: 0.1 10*3/uL (ref 0.0–0.1)
EOS PCT: 1.2 % (ref 0.0–5.0)
Eosinophils Absolute: 0.2 10*3/uL (ref 0.0–0.7)
HCT: 45.6 % (ref 36.0–46.0)
HEMOGLOBIN: 15.4 g/dL — AB (ref 12.0–15.0)
LYMPHS ABS: 5.7 10*3/uL — AB (ref 0.7–4.0)
Lymphocytes Relative: 36.8 % (ref 12.0–46.0)
MCHC: 33.8 g/dL (ref 30.0–36.0)
MCV: 89.3 fl (ref 78.0–100.0)
MONO ABS: 1.1 10*3/uL — AB (ref 0.1–1.0)
Monocytes Relative: 7.1 % (ref 3.0–12.0)
NEUTROS PCT: 54.5 % (ref 43.0–77.0)
Neutro Abs: 8.4 10*3/uL — ABNORMAL HIGH (ref 1.4–7.7)
PLATELETS: 360 10*3/uL (ref 150.0–400.0)
RBC: 5.11 Mil/uL (ref 3.87–5.11)
RDW: 13 % (ref 11.5–15.5)
WBC: 15.5 10*3/uL — AB (ref 4.0–10.5)

## 2016-04-04 LAB — BASIC METABOLIC PANEL
BUN: 12 mg/dL (ref 6–23)
CALCIUM: 10.3 mg/dL (ref 8.4–10.5)
CO2: 25 mEq/L (ref 19–32)
Chloride: 102 mEq/L (ref 96–112)
Creatinine, Ser: 0.75 mg/dL (ref 0.40–1.20)
GFR: 81.09 mL/min (ref >60.00)
Glucose, Bld: 135 mg/dL — ABNORMAL HIGH (ref 70–99)
POTASSIUM: 4 meq/L (ref 3.5–5.1)
SODIUM: 138 meq/L (ref 135–145)

## 2016-04-04 LAB — BRAIN NATRIURETIC PEPTIDE: Pro B Natriuretic peptide (BNP): 36 pg/mL (ref 0.0–100.0)

## 2016-04-04 NOTE — Assessment & Plan Note (Signed)
High probability. We discussed medical concerns. Plan-schedule sleep study

## 2016-04-04 NOTE — Assessment & Plan Note (Signed)
Sustained unexplained dyspnea over the past year. With positive Homans, I'm concerned about possible DVT/PE as discussed with her. I don't think weight gain alone is sufficient to explain. Plan-schedule PFT, schedule CT angiogram to rule out PE, labs for d-dimer, BNP, CBC, BMET

## 2016-04-04 NOTE — Progress Notes (Signed)
04/04/2016-70 year old female former smoker-pt ref by dr. Cathlean Cower. pt c/o daytime sleepiness, occ loud snoring, restless sleep & wakes up gasping & coughing for air. [id number] Medical complications include allergic rhinitis, hyperthyroid, DM 2 In addition to her referral concerns, she describes distinct easy dyspnea with activities of daily living over the past year. No sudden event. Some dry cough little no wheeze, no chest pain or palpitation. Denies history of heart or lung disease. Stopped smoking 20 years ago. No history of asthma or pneumonia. No ENT surgery. 30 pound weight gain in the last few years. She admits some pain in knees and lower legs with awareness of some left calf swelling and tenderness over the past 2 months.  Prior to Admission medications   Medication Sig Start Date End Date Taking? Authorizing Provider  aspirin 81 MG tablet Take 81 mg by mouth daily.     Yes Historical Provider, MD  atenolol (TENORMIN) 25 MG tablet TAKE 1 TABLET BY MOUTH DAILY 03/15/16  Yes Biagio Borg, MD  atorvastatin (LIPITOR) 40 MG tablet Take 1 tablet (40 mg total) by mouth daily. 12/23/15  Yes Biagio Borg, MD  calcium-vitamin D (OSCAL WITH D) 500-200 MG-UNIT tablet Take 4 tablets by mouth.    Yes Historical Provider, MD  cetirizine (ZYRTEC) 10 MG tablet Take 1 tablet (10 mg total) by mouth daily. 06/29/13  Yes Jearld Fenton, NP  Cholecalciferol (VITAMIN D) 2000 UNITS tablet Take 2,000 Units by mouth daily. Reported on 12/22/2015   Yes Historical Provider, MD  fluticasone (FLONASE) 50 MCG/ACT nasal spray Place 2 sprays into the nose daily as needed. For allergies 07/21/13  Yes Biagio Borg, MD  hydrochlorothiazide (HYDRODIURIL) 25 MG tablet Take 0.5 tablets (12.5 mg total) by mouth daily as needed. 12/22/15  Yes Biagio Borg, MD  levothyroxine (SYNTHROID, LEVOTHROID) 50 MCG tablet Take 1 tablet (50 mcg total) by mouth daily. 12/23/15  Yes Biagio Borg, MD  meclizine (ANTIVERT) 25 MG tablet Take 1 tablet  (25 mg total) by mouth 3 (three) times daily as needed for dizziness. 06/29/15  Yes Biagio Borg, MD  metFORMIN (GLUCOPHAGE-XR) 500 MG 24 hr tablet 2 tabs by mouth every AM 03/01/16  Yes Biagio Borg, MD  ondansetron (ZOFRAN ODT) 8 MG disintegrating tablet Take 1 tablet (8 mg total) by mouth every 8 (eight) hours as needed for nausea or vomiting. 06/29/15  Yes Biagio Borg, MD   Past Medical History  Diagnosis Date  . HYPERLIPIDEMIA 03/11/2008  . COLONIC POLYPS, HX OF 09/13/2007  . DEPRESSION 09/13/2007  . OSTEOARTHROSIS NOS, LOWER LEG 09/13/2007  . Chronic LBP   . BACK PAIN 09/27/2008  . BUNIONS, BILATERAL 12/23/2007  . CHEST DISCOMFORT, ATYPICAL 11/07/2009  . CONSTIPATION 09/27/2008  . DYSPNEA ON EXERTION 02/06/2010  . Eustachian tube dysfunction 05/20/2011  . FOOT PAIN, RIGHT 09/27/2008  . LBP (low back pain) 05/20/2011  . SHOULDER PAIN, LEFT 02/14/2009  . SYMPTOM, PALPITATIONS 09/13/2007  . URINARY INCONTINENCE 08/23/2009  . Vertigo 05/20/2011  . Leukocytosis 11/20/2011  . SVT (supraventricular tachycardia) ([place])   . Diabetes mellitus     diet controlled  . Anxiety   . ALLERGIC RHINITIS 04/02/2009    no per pt   Past Surgical History  Procedure Laterality Date  . Ccx    . Lumbar laminectomy    . Cholecystectomy    . Lumbar laminectomy    . Bunionectomy      right  . Shoulder arthroscopy  12/13/2011    Procedure: ARTHROSCOPY SHOULDER;  Surgeon: Augustin Schooling;  Location: [place];  Service: Orthopedics;  Laterality: Left;  Left Shoulder ArthroscopyDebridement Limited Tenodesis Open Rotator Cuff Repair Spur Removal Right Shoulder Injection    Family History  Problem Relation Age of Onset  . Colon cancer Mother   . Heart disease Father   . Diabetes type II Father   . Heart disease Brother   . Diabetes Other     father  . Esophageal cancer Neg Hx   . Stomach cancer Neg Hx   . Rectal cancer Neg Hx    Social History   Social History  . Marital Status: Married    Spouse Name: N/A  .  Number of Children: 2  . Years of Education: N/A   Occupational History  . Disabled Psych.    Social History Main Topics  . Smoking status: Former Smoker -- 1.00 packs/day for 10 years  . Smokeless tobacco: Never Used     Comment: Smoked as a teenager. Quit in 1970  . Alcohol Use: No  . Drug Use: No  . Sexual Activity: No   Other Topics Concern  . Not on file   Social History Narrative   Husband chronically ill in nursing home.      Disabled - psychiatric   ROS-see HPI   Negative unless "+" Constitutional:    weight loss, night sweats, fevers, chills, fatigue, lassitude. HEENT:    headaches, difficulty swallowing, tooth/dental problems, sore throat,       sneezing, itching, ear ache, nasal congestion, post nasal drip, snoring CV:    chest pain, orthopnea, PND, + swelling in lower extremities, anasarca,                                                     dizziness, palpitations Resp:   + shortness of breath with exertion or at rest.                productive cough,   non-productive cough, coughing up of blood.              change in color of mucus.  wheezing.   Skin:    rash or lesions. GI:  No-   heartburn, indigestion, abdominal pain, nausea, vomiting, diarrhea,                 change in bowel habits, loss of appetite GU: dysuria, change in color of urine, no urgency or frequency.   flank pain. MS:   joint pain, stiffness, decreased range of motion, back pain. Neuro-     nothing unusual Psych:  change in mood or affect.  depression or anxiety.   memory loss.  OBJ- Physical Exam General- Alert, Oriented, Affect-appropriate, Distress- none acute, + obese Skin- rash-none, lesions- none, excoriation- none Lymphadenopathy- none Head- atraumatic            Eyes- Gross vision intact, PERRLA, conjunctivae and secretions clear            Ears- Hearing, canals-normal            Nose- Clear, no-Septal dev, mucus, polyps, erosion, perforation             Throat- Mallampati III-IV  , mucosa clear , drainage- none, tonsils- atrophic Neck- flexible , trachea midline, no stridor ,  thyroid nl, carotid no bruit Chest - symmetrical excursion , unlabored           Heart/CV- RRR , no murmur , no gallop  , no rub, nl s1 s2                           - JVD- none , edema- none, stasis changes- none, varices- none           Lung- clear to P&A, wheeze- none, cough- none , dullness-none, rub- none, + increased work of breathing,               seems visibly labored sitting at rest although O2 saturation normal           Chest wall-  Abd-  Br/ Gen/ Rectal- Not done, not indicated Extrem- cyanosis- none, clubbing, none, atrophy- none, strength- nl. + Positive Homans left calf with left calf tenderness to compression Neuro- grossly intact to observation

## 2016-04-04 NOTE — Patient Instructions (Addendum)
Order- schedule PFT     Dx dyspnea   Order- schedule CTangiogram chest     Dx PE  Order- lab- CBC w diff, BMET, D-dimer, BNP       Dx dyspnea  Order- schedule unattended home sleep test     Dx OSA

## 2016-04-05 ENCOUNTER — Ambulatory Visit (INDEPENDENT_AMBULATORY_CARE_PROVIDER_SITE_OTHER)
Admission: RE | Admit: 2016-04-05 | Discharge: 2016-04-05 | Disposition: A | Discharge status: Home / Self Care | Payer: Medicare Other | Source: Ambulatory Visit | Attending: Internal Medicine | Admitting: Internal Medicine

## 2016-04-05 DIAGNOSIS — I2699 Other pulmonary embolism without acute cor pulmonale: Secondary | ICD-10-CM | POA: Diagnosis not present

## 2016-04-05 DIAGNOSIS — R0602 Shortness of breath: Secondary | ICD-10-CM | POA: Diagnosis not present

## 2016-04-05 LAB — D-DIMER, QUANTITATIVE: D-Dimer, Quant: 0.23 ug/mL-FEU (ref 0.00–0.48)

## 2016-04-05 IMAGING — CT CT ANGIO CHEST
2 of 7 series · 19 of 46 positions shown · IV contrast (ISOVUE 370)
Comparison: Chest x-ray dated [DATE] and report of CT scan of
the chest dated [DATE]

CLINICAL DATA: Progressive shortness of breath.  Ankle swelling.

EXAM:
CT ANGIOGRAPHY CHEST WITH CONTRAST
TECHNIQUE: Multidetector CT imaging of the chest was performed using the
standard protocol during bolus administration of intravenous
contrast. Multiplanar CT image reconstructions and MIPs were
obtained to evaluate the vascular anatomy.
CONTRAST:  80 cc Isovue 370

[Series 5: thins · axial · 0.71mm/px · z∈[-262,-29]mm · 16 of 257 slices shown]
[im 12/257  lung]
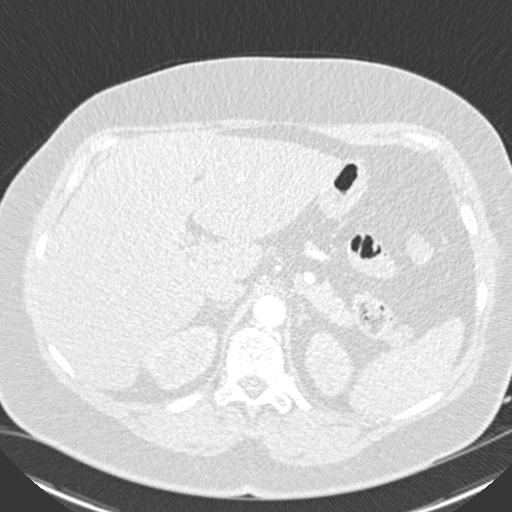
[im 34/257  soft-tissue]
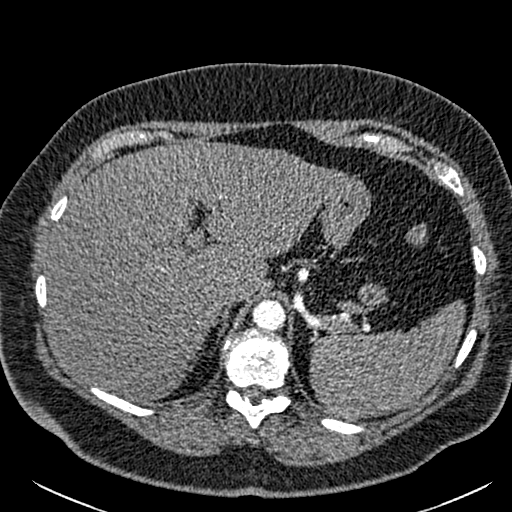
[im 45/257  lung]
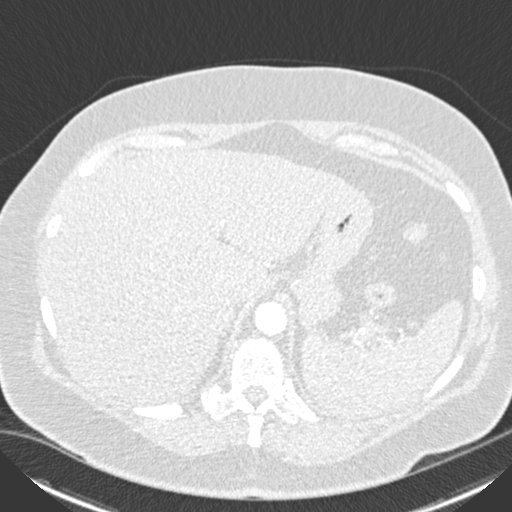
[im 56/257  soft-tissue]
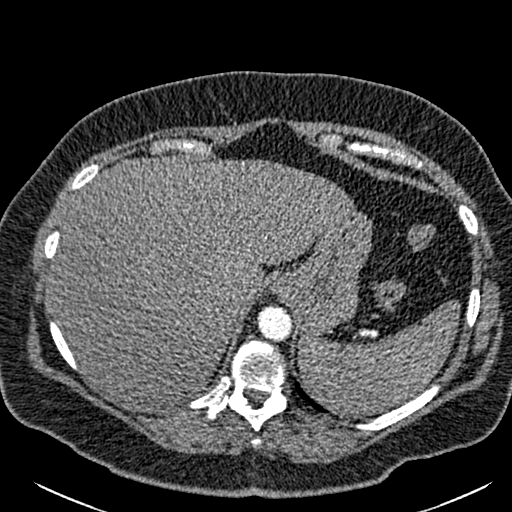
[im 78/257  lung]
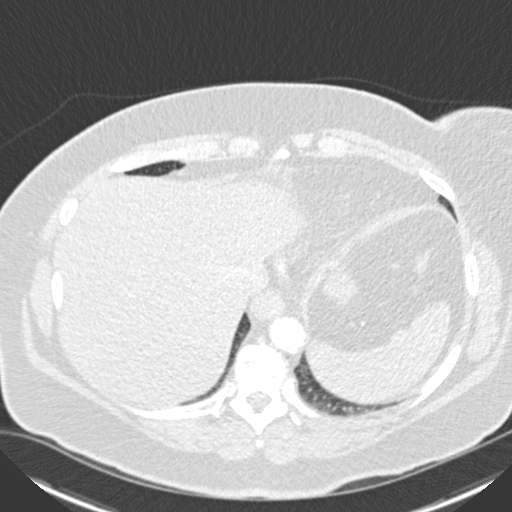
[im 90/257  soft-tissue]
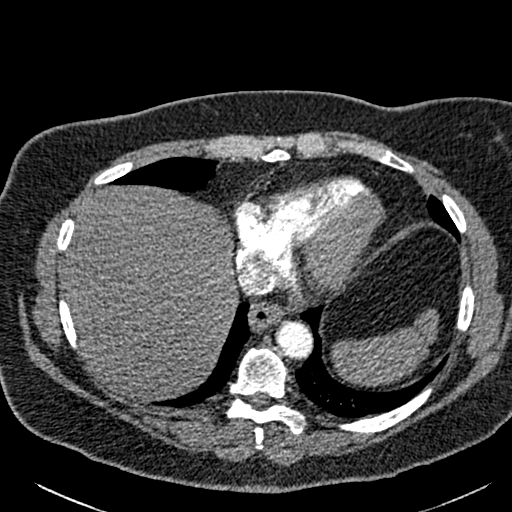
[im 101/257  lung]
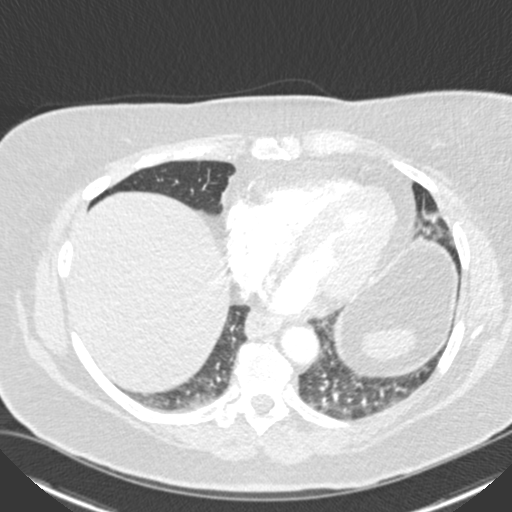
[im 123/257  soft-tissue]
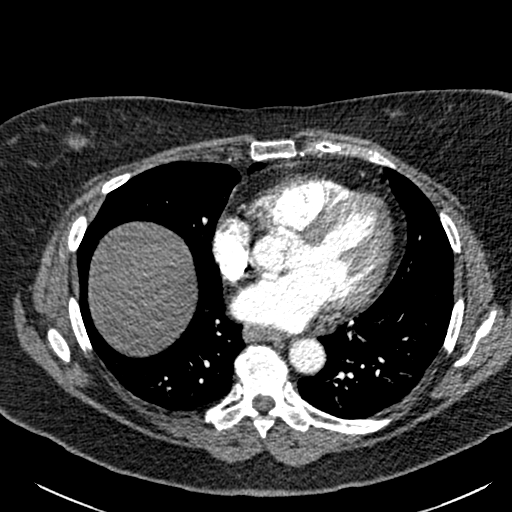
[im 134/257  lung]
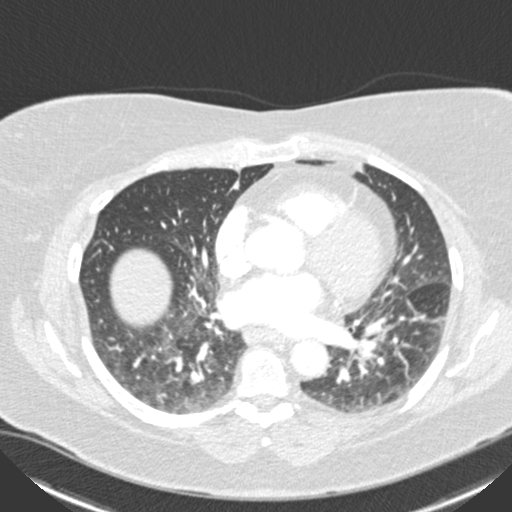
[im 156/257  soft-tissue]
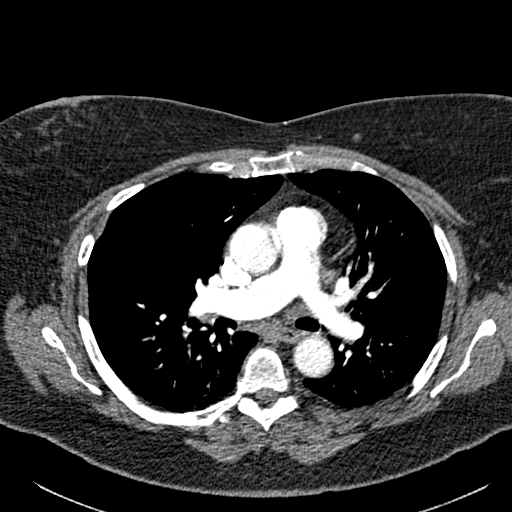
[im 167/257  lung]
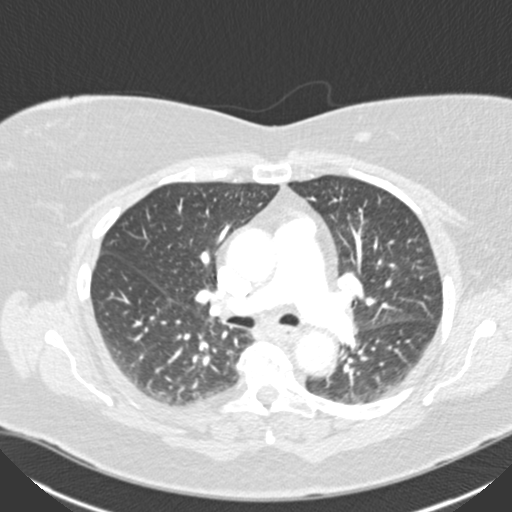
[im 179/257  soft-tissue]
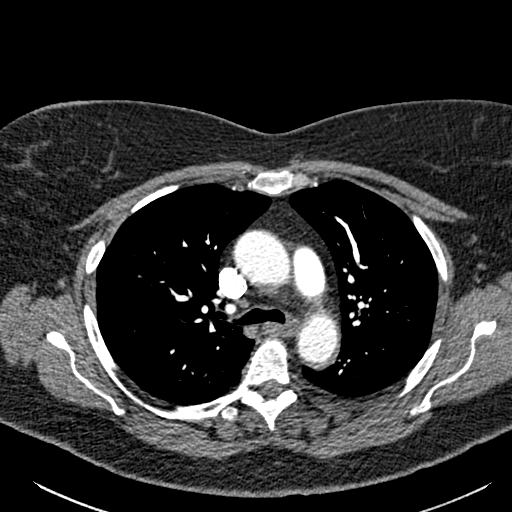
[im 201/257  lung]
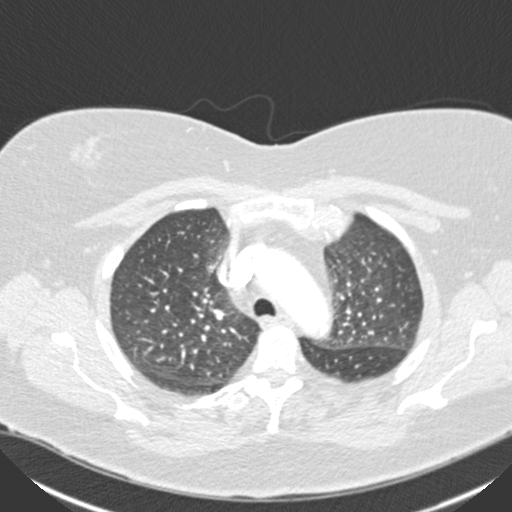
[im 212/257  soft-tissue]
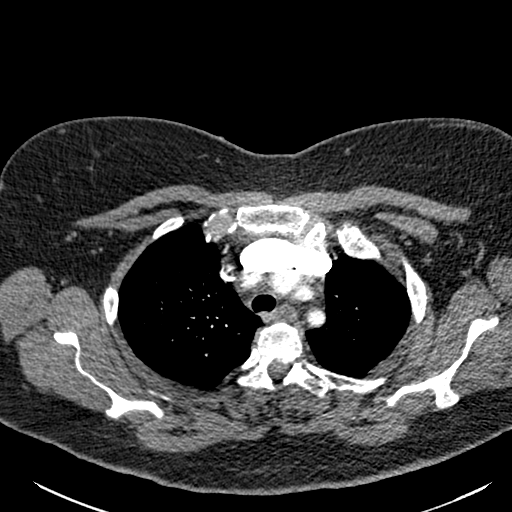
[im 223/257  lung]
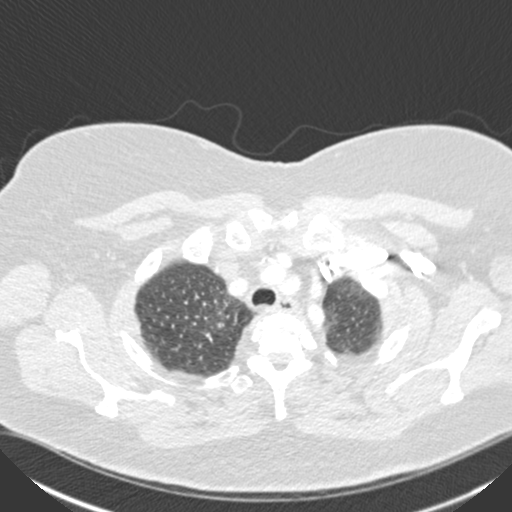
[im 245/257  soft-tissue]
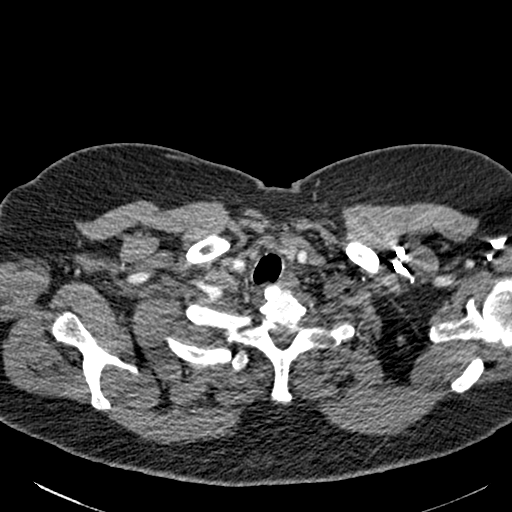

[Series 7: coronal mpr · coronal · 0.59mm/px · 3 of 122 slices shown]
[im 31/122  soft-tissue]
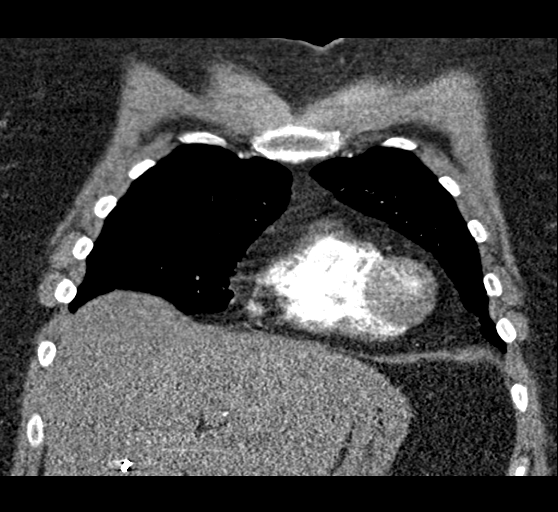
[im 61/122  soft-tissue]
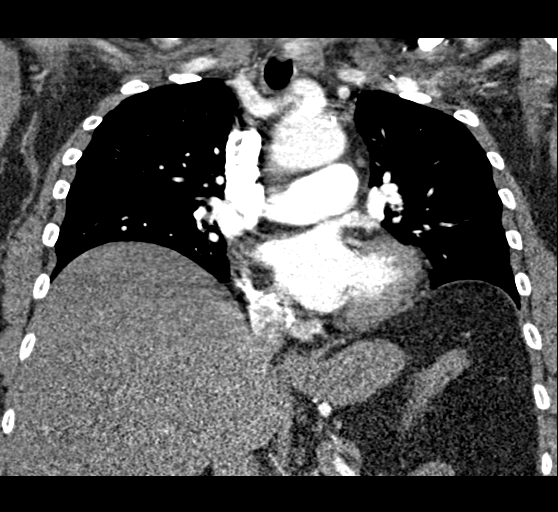
[im 91/122  soft-tissue]
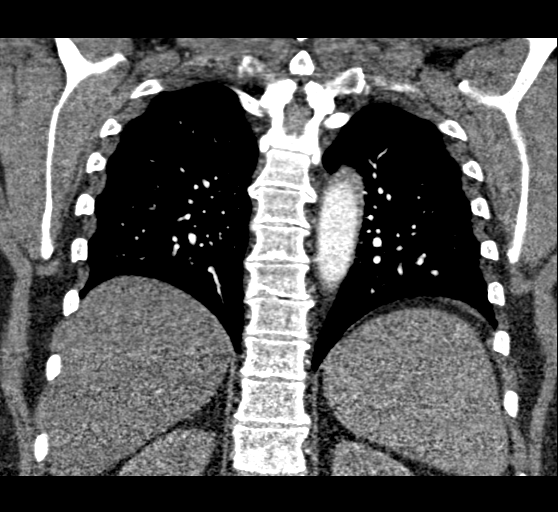

[19 of 46 positions shown; findings below may reference images not displayed]

FINDINGS: Mediastinum/Lymph Nodes: No pulmonary emboli or thoracic aortic
dissection identified. No masses or pathologically enlarged lymph
nodes identified. There is atherosclerosis of the descending
thoracic aorta. Heart size is normal.

Lungs/Pleura: No pulmonary mass, infiltrate, or effusion.

Upper abdomen: Normal.

Musculoskeletal: No significant bone abnormality. There is a 2.2 cm
lobulated nodule in the inferior aspect of the right breast and
there is a 14 mm nodule in the medial aspect of the left breast. No
prior studies for comparison.

Review of the MIP images confirms the above findings.
IMPRESSION: 1. No pulmonary emboli or other acute abnormalities.
2. Bilateral breast nodules. These are nonspecific. Has the patient
had a recent mammogram?
3. Aortic atherosclerosis.

## 2016-04-05 MED ORDER — IOPAMIDOL (ISOVUE-370) INJECTION 76%
80.0000 mL | Freq: Once | INTRAVENOUS | Status: AC | PRN
  Administered 2016-04-05: 80 mL via INTRAVENOUS

## 2016-04-05 NOTE — Progress Notes (Signed)
Quick Note:  Attempted to call pt. Unable to reach pt or LVM. No VM set up. ______

## 2016-04-08 ENCOUNTER — Ambulatory Visit (HOSPITAL_COMMUNITY)
Admission: RE | Admit: 2016-04-08 | Discharge: 2016-04-08 | Disposition: A | Discharge status: Home / Self Care | Payer: Medicare Other | Source: Ambulatory Visit | Attending: Internal Medicine | Admitting: Internal Medicine

## 2016-04-08 DIAGNOSIS — R0600 Dyspnea, unspecified: Secondary | ICD-10-CM | POA: Insufficient documentation

## 2016-04-08 LAB — PULMONARY FUNCTION TEST
DL/VA % PRED: 69 %
DL/VA: 3.41 ml/min/mmHg/L
DLCO UNC % PRED: 59 %
DLCO UNC: 15.36 ml/min/mmHg
FEF 25-75 POST: 2.54 L/s
FEF 25-75 PRE: 1.93 L/s
FEF2575-%Change-Post: 31 %
FEF2575-%Pred-Post: 131 %
FEF2575-%Pred-Pre: 100 %
FEV1-%Change-Post: 9 %
FEV1-%PRED-PRE: 93 %
FEV1-%Pred-Post: 103 %
FEV1-POST: 2.43 L
FEV1-Pre: 2.21 L
FEV1FVC-%Change-Post: 20 %
FEV1FVC-%PRED-PRE: 98 %
FEV6-%CHANGE-POST: -7 %
FEV6-%Pred-Post: 90 %
FEV6-%Pred-Pre: 98 %
FEV6-POST: 2.69 L
FEV6-Pre: 2.92 L
FEV6FVC-%CHANGE-POST: 1 %
FEV6FVC-%PRED-POST: 104 %
FEV6FVC-%Pred-Pre: 103 %
FVC-%Change-Post: -8 %
FVC-%Pred-Post: 86 %
FVC-%Pred-Pre: 95 %
FVC-PRE: 2.95 L
FVC-Post: 2.69 L
PRE FEV6/FVC RATIO: 99 %
Post FEV1/FVC ratio: 90 %
Post FEV6/FVC ratio: 100 %
Pre FEV1/FVC ratio: 75 %
RV % PRED: 90 %
RV: 2.04 L
TLC % PRED: 95 %
TLC: 4.96 L

## 2016-04-08 MED ORDER — ALBUTEROL SULFATE (2.5 MG/3ML) 0.083% IN NEBU
2.5000 mg | INHALATION_SOLUTION | Freq: Once | RESPIRATORY_TRACT | Status: AC
  Administered 2016-04-08: 2.5 mg via RESPIRATORY_TRACT

## 2016-04-16 DIAGNOSIS — Z780 Asymptomatic menopausal state: Secondary | ICD-10-CM | POA: Diagnosis not present

## 2016-04-16 DIAGNOSIS — N63 Unspecified lump in breast: Secondary | ICD-10-CM | POA: Diagnosis not present

## 2016-04-16 LAB — HM MAMMOGRAPHY: HM MAMMO: NORMAL (ref 0–4)

## 2016-04-17 ENCOUNTER — Encounter: Payer: Self-pay | Admitting: Internal Medicine

## 2016-05-07 DIAGNOSIS — G4733 Obstructive sleep apnea (adult) (pediatric): Secondary | ICD-10-CM | POA: Diagnosis not present

## 2016-05-21 ENCOUNTER — Encounter: Payer: Self-pay | Admitting: Internal Medicine

## 2016-05-22 ENCOUNTER — Encounter: Payer: Self-pay | Admitting: Internal Medicine

## 2016-05-22 ENCOUNTER — Ambulatory Visit (INDEPENDENT_AMBULATORY_CARE_PROVIDER_SITE_OTHER): Payer: Medicare Other | Admitting: Internal Medicine

## 2016-05-22 ENCOUNTER — Other Ambulatory Visit: Payer: Self-pay | Admitting: *Deleted

## 2016-05-22 VITALS — BP 120/68 | HR 93 | Ht 66.5 in | Wt 253.2 lb

## 2016-05-22 DIAGNOSIS — R0600 Dyspnea, unspecified: Secondary | ICD-10-CM

## 2016-05-22 DIAGNOSIS — G4733 Obstructive sleep apnea (adult) (pediatric): Secondary | ICD-10-CM

## 2016-05-22 DIAGNOSIS — D72829 Elevated white blood cell count, unspecified: Secondary | ICD-10-CM

## 2016-05-22 MED ORDER — BUDESONIDE-FORMOTEROL FUMARATE 160-4.5 MCG/ACT IN AERO: 2.0000 | INHALATION_SPRAY | Freq: Two times a day (BID) | RESPIRATORY_TRACT | Status: DC

## 2016-05-22 NOTE — Progress Notes (Signed)
Patient ID: Alexandra Wolfe, female   DOB: 03/14/1946, 70 y.o.   MRN: [id number]   Patient seen in the office today and instructed on use of Symbicort 160/4.5.  Patient expressed understanding and demonstrated technique.

## 2016-05-22 NOTE — Progress Notes (Signed)
04/04/2016-70 year old female former smoker-pt ref by dr. Cathlean Cower. pt c/o daytime sleepiness, occ loud snoring, restless sleep & wakes up gasping & coughing for air. [id number] Medical complications include allergic rhinitis, hyperthyroid, DM 2 In addition to her referral concerns, she describes distinct easy dyspnea with activities of daily living over the past year. No sudden event. Some dry cough little no wheeze, no chest pain or palpitation. Denies history of heart or lung disease. Stopped smoking 20 years ago. No history of asthma or pneumonia. No ENT surgery. 30 pound weight gain in the last few years. She admits some pain in knees and lower legs with awareness of some left calf swelling and tenderness over the past 2 months.  05/22/2016-70 year old female former smoker followed for OSA FOLLOWS [id number] HST, PFT, Labwork, and CT angio results with patient.  Unattended Home Sleep Test- 05/07/2016-moderate obstructive sleep apnea-AHI 18.4/hour, desaturation to 79%, body weight 239.5 pounds  PFT-04/08/2016-moderate reduction of diffusion capacity at 59%, normal spirometry flows, insignificant response to bronchodilator, TLC 95% Lab-sustained leukocytosis with WBC climbing from 12,500 on 12/22/2015 to 15,500 on 04/04/2016. Hemoglobin 15.4, d-dimer 0.23, BNP 36 CTa chest-04/05/2016 IMPRESSION: 1. No pulmonary emboli or other acute abnormalities. 2. Bilateral breast nodules. These are nonspecific. Has the patient had a recent mammogram?- 04/16/16 3. Aortic atherosclerosis. Electronically Signed  By: Lorriane Shire M.D.  On: 04/05/2016 11:10 Patient reports occasional mild wheeze on exertion but no phlegm and no acute process.  ROS-see HPI   Negative unless "+" Constitutional:    weight loss, night sweats, fevers, chills, fatigue, lassitude. HEENT:    headaches, difficulty swallowing, tooth/dental problems, sore throat,       sneezing, itching, ear ache, nasal congestion, post nasal  drip, snoring CV:    chest pain, orthopnea, PND, + swelling in lower extremities, anasarca,                                                     dizziness, palpitations Resp:   + shortness of breath with exertion or at rest.                productive cough,   non-productive cough, coughing up of blood.              change in color of mucus.  + wheezing.   Skin:    rash or lesions. GI:  No-   heartburn, indigestion, abdominal pain, nausea, vomiting, diarrhea,                 change in bowel habits, loss of appetite GU: dysuria, change in color of urine, no urgency or frequency.   flank pain. MS:   joint pain, stiffness, decreased range of motion, back pain. Neuro-     nothing unusual Psych:  change in mood or affect.  depression or anxiety.   memory loss.  OBJ- Physical Exam General- Alert, Oriented, Affect-appropriate, Distress- none acute, + obese Skin- rash-none, lesions- none, excoriation- none Lymphadenopathy- none Head- atraumatic            Eyes- Gross vision intact, PERRLA, conjunctivae and secretions clear            Ears- Hearing, canals-normal            Nose- Clear, no-Septal dev, mucus, polyps, erosion, perforation  Throat- Mallampati III-IV , mucosa clear , drainage- none, tonsils- atrophic Neck- flexible , trachea midline, no stridor , thyroid nl, carotid no bruit Chest - symmetrical excursion , unlabored           Heart/CV- RRR , no murmur , no gallop  , no rub, nl s1 s2                           - JVD- none , edema- none, stasis changes- none, varices- none           Lung-  wheeze + slight, cough- none , dullness-none, rub- none,            Chest wall-  Abd-  Br/ Gen/ Rectal- Not done, not indicated Extrem- cyanosis- none, clubbing, none, atrophy- none, strength- nl. + Positive Homans left calf with left calf tenderness to compression Neuro- grossly intact to observation

## 2016-05-22 NOTE — Patient Instructions (Signed)
Order- new DME new CPAP auto 5-20, mask of choice, humidifier, supplies, AirView     Dx OSA  Sample Symbicort 160     Inhale 2 puffs, then rinse mouth, twice daily   We will ask Dr Jenny Reichmann to consider your progressively increasing white blood count. He might want you to see a Hematologist, especially since your brother has a blood disorder.

## 2016-05-31 DIAGNOSIS — G4733 Obstructive sleep apnea (adult) (pediatric): Secondary | ICD-10-CM | POA: Diagnosis not present

## 2016-06-06 ENCOUNTER — Telehealth: Payer: Self-pay | Admitting: Internal Medicine

## 2016-06-06 NOTE — Telephone Encounter (Signed)
Spoke with [facility]. They are needing pt HST as this was not submitted to them. I have sent this to the fax # above. Nothing further needed

## 2016-06-07 NOTE — Assessment & Plan Note (Signed)
Unexplained progressive leukocytosis. Not sure how it relates to other complaints. Her primary physician may want to consider Hematology referral for possible bone marrow

## 2016-06-07 NOTE — Assessment & Plan Note (Addendum)
Unexplained significant reduction in diffusion capacity. CT scan, d-dimer and BNP don't point to obvious blood clots or congestive heart failure. Pulmonary hypertension is unlikely. Patient is not anemic. There is not obvious significant emphysema. Plan-sample Symbicort or Breo for therapeutic trial to see if symptoms are affected. Anticipate overnight oximetry on CPAP. If there is sustained hypoxemia at night, not corrected by CPAP, then Medicare would require a CPAP unattended sleep study to document that she is wearing CPAP and still desaturating before it would approve adding home O2.

## 2016-06-07 NOTE — Assessment & Plan Note (Signed)
Moderately severe obstructive sleep apnea with oxygen desaturation. Medical concerns and treatment choices were reviewed. Emphasis on responsibility for safe driving, maintain good sleep habits. Seek to maintain normal weight. Plan-begin CPAP with auto titration

## 2016-06-11 ENCOUNTER — Other Ambulatory Visit: Payer: Self-pay | Admitting: Internal Medicine

## 2016-06-30 DIAGNOSIS — G4733 Obstructive sleep apnea (adult) (pediatric): Secondary | ICD-10-CM | POA: Diagnosis not present

## 2016-07-31 DIAGNOSIS — G4733 Obstructive sleep apnea (adult) (pediatric): Secondary | ICD-10-CM | POA: Diagnosis not present

## 2016-08-21 ENCOUNTER — Encounter: Payer: Self-pay | Admitting: Internal Medicine

## 2016-08-22 ENCOUNTER — Ambulatory Visit (INDEPENDENT_AMBULATORY_CARE_PROVIDER_SITE_OTHER): Payer: Medicare Other | Admitting: Internal Medicine

## 2016-08-22 ENCOUNTER — Other Ambulatory Visit (INDEPENDENT_AMBULATORY_CARE_PROVIDER_SITE_OTHER): Payer: Medicare Other

## 2016-08-22 ENCOUNTER — Encounter: Payer: Self-pay | Admitting: Internal Medicine

## 2016-08-22 DIAGNOSIS — Z23 Encounter for immunization: Secondary | ICD-10-CM

## 2016-08-22 DIAGNOSIS — E119 Type 2 diabetes mellitus without complications: Secondary | ICD-10-CM | POA: Diagnosis not present

## 2016-08-22 DIAGNOSIS — G4710 Hypersomnia, unspecified: Secondary | ICD-10-CM

## 2016-08-22 DIAGNOSIS — G4733 Obstructive sleep apnea (adult) (pediatric): Secondary | ICD-10-CM | POA: Diagnosis not present

## 2016-08-22 LAB — HEMOGLOBIN A1C: Hgb A1c MFr Bld: 9.6 % — ABNORMAL HIGH (ref 4.6–6.5)

## 2016-08-22 LAB — LIPID PANEL
CHOL/HDL RATIO: 3
Cholesterol: 133 mg/dL (ref 0–200)
HDL: 41.5 mg/dL (ref >39.00)
LDL CALC: 61 mg/dL (ref 0–99)
NonHDL: 91.65
TRIGLYCERIDES: 151 mg/dL — AB (ref 0.0–149.0)
VLDL: 30.2 mg/dL (ref 0.0–40.0)

## 2016-08-22 LAB — HEPATIC FUNCTION PANEL
ALT: 21 U/L (ref 0–35)
AST: 22 U/L (ref 0–37)
Albumin: 4.3 g/dL (ref 3.5–5.2)
Alkaline Phosphatase: 105 U/L (ref 39–117)
BILIRUBIN TOTAL: 0.6 mg/dL (ref 0.2–1.2)
Bilirubin, Direct: 0.1 mg/dL (ref 0.0–0.3)
Total Protein: 7.2 g/dL (ref 6.0–8.3)

## 2016-08-22 LAB — BASIC METABOLIC PANEL
BUN: 14 mg/dL (ref 6–23)
CO2: 26 mEq/L (ref 19–32)
Calcium: 9.4 mg/dL (ref 8.4–10.5)
Chloride: 98 mEq/L (ref 96–112)
Creatinine, Ser: 0.72 mg/dL (ref 0.40–1.20)
GFR: 84.91 mL/min (ref >60.00)
Glucose, Bld: 265 mg/dL — ABNORMAL HIGH (ref 70–99)
POTASSIUM: 4.1 meq/L (ref 3.5–5.1)
Sodium: 134 mEq/L — ABNORMAL LOW (ref 135–145)

## 2016-08-22 MED ORDER — ZALEPLON 5 MG PO CAPS: ORAL_CAPSULE | ORAL | 5 refills | Status: DC

## 2016-08-22 MED ORDER — BUDESONIDE-FORMOTEROL FUMARATE 160-4.5 MCG/ACT IN AERO: 2.0000 | INHALATION_SPRAY | Freq: Two times a day (BID) | RESPIRATORY_TRACT | 6 refills | Status: DC

## 2016-08-22 NOTE — Patient Instructions (Addendum)
Script printed for [employer] 5 mg    Try 1 or 2 as you get ready for bed, as needed for sleep  Order- DME [facility]- continue CPAP auto 5-20, mask of choice, supplies, humidifier, AirView   Dx OSA  You can ask your DME company to go over other mask style choices with you if you want.  You can check in your booklet how to adjust the humidifier for your comfort.   Please call as needed  Flu vax

## 2016-08-22 NOTE — Progress Notes (Signed)
04/04/2016-70 year old female former smoker-pt ref by dr. Cathlean Cower. pt c/o daytime sleepiness, occ loud snoring, restless sleep & wakes up gasping & coughing for air. [id number] Medical complications include allergic rhinitis, hyperthyroid, DM 2 In addition to her referral concerns, she describes distinct easy dyspnea with activities of daily living over the past year. No sudden event. Some dry cough little no wheeze, no chest pain or palpitation. Denies history of heart or lung disease. Stopped smoking 20 years ago. No history of asthma or pneumonia. No ENT surgery. 30 pound weight gain in the last few years. She admits some pain in knees and lower legs with awareness of some left calf swelling and tenderness over the past 2 months.  05/22/2016-70 year old female former smoker followed for OSA FOLLOWS [id number] HST, PFT, Labwork, and CT angio results with patient.  Unattended Home Sleep Test- 05/07/2016-moderate obstructive sleep apnea-AHI 18.4/hour, desaturation to 79%, body weight 239.5 pounds  PFT-04/08/2016-moderate reduction of diffusion capacity at 59%, normal spirometry flows, insignificant response to bronchodilator, TLC 95% Lab-sustained leukocytosis with WBC climbing from 12,500 on 12/22/2015 to 15,500 on 04/04/2016. Hemoglobin 15.4, d-dimer 0.23, BNP 36 CTa chest-04/05/2016 IMPRESSION: 1. No pulmonary emboli or other acute abnormalities. 2. Bilateral breast nodules. These are nonspecific. Has the patient had a recent mammogram?- 04/16/16 3. Aortic atherosclerosis. Electronically Signed  By: Lorriane Shire M.D.  On: 04/05/2016 11:10 Patient reports occasional mild wheeze on exertion but no phlegm and no acute process.  08/22/2016-70 year old female former smoker followed for OSA CPAP auto 5-20/[facility] FOLLOWS FOR: DME [facility]; will need order for new supplies. DL attached and pt wears CPAP for at least 4 hours nighlty. Still trying to get used to wearing her fullface mask.  Discussed alternatives. She identifies as a "mouth breather". Some difficulty initiating sleep. She is alone and unable to report on any change in snoring.  Download-93% compliance with 4 hour rule, AHI 0.3 indicating good control.  ROS-see HPI   Negative unless "+" Constitutional:    weight loss, night sweats, fevers, chills,+ fatigue, lassitude. HEENT:    headaches, difficulty swallowing, tooth/dental problems, sore throat,       sneezing, itching, ear ache, nasal congestion, post nasal drip, snoring CV:    chest pain, orthopnea, PND, + swelling in lower extremities, anasarca,                                                     dizziness, palpitations Resp:   + shortness of breath with exertion or at rest.                productive cough,   non-productive cough, coughing up of blood.              change in color of mucus.  + wheezing.   Skin:    rash or lesions. GI:  No-   heartburn, indigestion, abdominal pain, nausea, vomiting, diarrhea,                 change in bowel habits, loss of appetite GU: dysuria, change in color of urine, no urgency or frequency.   flank pain. MS:   joint pain, stiffness, decreased range of motion, back pain. Neuro-     nothing unusual Psych:  change in mood or affect.  depression or anxiety.   memory loss.  OBJ- Physical Exam General- Alert,  Oriented, Affect-appropriate, Distress- none acute, + obese Skin- rash-none, lesions- none, excoriation- none Lymphadenopathy- none Head- atraumatic            Eyes- Gross vision intact, PERRLA, conjunctivae and secretions clear            Ears- Hearing, canals-normal            Nose- Clear, no-Septal dev, mucus, polyps, erosion, perforation             Throat- Mallampati III-IV , mucosa clear , drainage- none, tonsils- atrophic Neck- flexible , trachea midline, no stridor , thyroid nl, carotid no bruit Chest - symmetrical excursion , unlabored           Heart/CV- RRR , no murmur , no gallop  , no rub, nl s1 s2                            - JVD- none , edema- none, stasis changes- none, varices- none           Lung-  wheeze + slight, cough- none , dullness-none, rub- none,            Chest wall-  Abd-  Br/ Gen/ Rectal- Not done, not indicated Extrem- cyanosis- none, clubbing, none, atrophy- none, strength- nl.  Neuro- grossly intact to observation

## 2016-08-22 NOTE — Assessment & Plan Note (Signed)
I think this was due to her obstructive sleep apnea. She is having some trouble initiating and maintaining sleep now as she first gets used to CPAP some we are adding Sonata. I reemphasized good sleep habits.

## 2016-08-22 NOTE — Addendum Note (Signed)
Addended by: Clayborne Dana C on: 08/22/2016 11:53 AM   Modules accepted: Orders

## 2016-08-22 NOTE — Assessment & Plan Note (Signed)
-  She will probably do very well with CPAP that she gets more used to it. She is comfortable with the pressure range. She says she is comfortable with the mask, just bothered by "confinement". We discussed use of the humidifier. It sounds as if it would help her to have something available as a sleep aid for a while if she adjusts to CPAP. Plan-continue present CPAP pressure range. Add Sonata for help with sleep when needed.

## 2016-08-31 DIAGNOSIS — G4733 Obstructive sleep apnea (adult) (pediatric): Secondary | ICD-10-CM | POA: Diagnosis not present

## 2016-09-03 ENCOUNTER — Ambulatory Visit (INDEPENDENT_AMBULATORY_CARE_PROVIDER_SITE_OTHER): Payer: Medicare Other | Admitting: Internal Medicine

## 2016-09-03 VITALS — BP 130/78 | HR 98 | Temp 98.0°F | Resp 20 | Wt 246.6 lb

## 2016-09-03 DIAGNOSIS — F329 Major depressive disorder, single episode, unspecified: Secondary | ICD-10-CM

## 2016-09-03 DIAGNOSIS — Z0001 Encounter for general adult medical examination with abnormal findings: Secondary | ICD-10-CM

## 2016-09-03 DIAGNOSIS — E119 Type 2 diabetes mellitus without complications: Secondary | ICD-10-CM | POA: Diagnosis not present

## 2016-09-03 DIAGNOSIS — E785 Hyperlipidemia, unspecified: Secondary | ICD-10-CM | POA: Diagnosis not present

## 2016-09-03 DIAGNOSIS — F32A Depression, unspecified: Secondary | ICD-10-CM

## 2016-09-03 DIAGNOSIS — R6889 Other general symptoms and signs: Secondary | ICD-10-CM

## 2016-09-03 MED ORDER — FLUOXETINE HCL 20 MG PO TABS: 20.0000 mg | ORAL_TABLET | Freq: Every day | ORAL | 3 refills | Status: DC

## 2016-09-03 MED ORDER — METFORMIN HCL ER 500 MG PO TB24: ORAL_TABLET | ORAL | 3 refills | Status: DC

## 2016-09-03 MED ORDER — GLIPIZIDE ER 2.5 MG PO TB24: 2.5000 mg | ORAL_TABLET | Freq: Every day | ORAL | 3 refills | Status: DC

## 2016-09-03 NOTE — Progress Notes (Signed)
Pre visit review using our clinic review tool, if applicable. No additional management support is needed unless otherwise documented below in the visit note. 

## 2016-09-03 NOTE — Assessment & Plan Note (Signed)
Mild worsening, denies SI or HI, for prozac 20 qd,  to f/u any worsening symptoms or concerns

## 2016-09-03 NOTE — Assessment & Plan Note (Addendum)
Uncontrolled,  Lab Results  Component Value Date   HGBA1C 9.6 (H) 08/22/2016   For increased metformin ER 500 to 4 tab per day, and start glipizide ER 2.5 qd, for cont'd diet and wt control efforts, exercise

## 2016-09-03 NOTE — Progress Notes (Signed)
Subjective:    Patient ID: Alexandra Wolfe, female    DOB: 25-Jun-1946, 70 y.o.   MRN: [id number]  HPI   Here to f/u; overall doing ok,  Pt denies chest pain, increasing sob or doe, wheezing, orthopnea, PND, increased LE swelling, palpitations, dizziness or syncope.  Pt denies new neurological symptoms such as new headache, or facial or extremity weakness or numbness.  Pt denies polydipsia, polyuria, or low sugar episode.   Pt denies new neurological symptoms such as new headache, or facial or extremity weakness or numbness.   Pt states overall good compliance with meds, mostly trying to follow appropriate diet, with wt overall down a few lbs,  but little exercise however, does walk 30 min per day, but o/w is sitting in the housel.   Wt Readings from Last 3 Encounters:  09/03/16 246 lb 9 oz (111.8 kg)  08/22/16 247 lb 3.2 oz (112.1 kg)  05/22/16 253 lb 3.2 oz (114.9 kg)  Does c/o ongoing fatigue, but denies signficant daytime hypersomnolence, good compliacne with CPAP.  Has had mild worsening depressive symptoms, but no suicidal ideation, or panic; has ongoing anxiety.  Not seeing therapist and last psych tx was over 10 yrs ago, no meds or hospn since then.  S/p ECT tx x 1 about 20 yrs ago.Lives with son to help with his child.  Retired.  But quit church and o/w not much out of the house.   Past Medical History:  Diagnosis Date  . ALLERGIC RHINITIS 04/02/2009   no per pt  . Anxiety   . BACK PAIN 09/27/2008  . BUNIONS, BILATERAL 12/23/2007  . CHEST DISCOMFORT, ATYPICAL 11/07/2009  . Chronic LBP   . COLONIC POLYPS, HX OF 09/13/2007  . CONSTIPATION 09/27/2008  . DEPRESSION 09/13/2007  . Diabetes mellitus    diet controlled  . DYSPNEA ON EXERTION 02/06/2010  . Eustachian tube dysfunction 05/20/2011  . FOOT PAIN, RIGHT 09/27/2008  . HYPERLIPIDEMIA 03/11/2008  . LBP (low back pain) 05/20/2011  . Leukocytosis 11/20/2011  . OSTEOARTHROSIS NOS, LOWER LEG 09/13/2007  . SHOULDER PAIN, LEFT 02/14/2009  .  SVT (supraventricular tachycardia) ([place])   . SYMPTOM, PALPITATIONS 09/13/2007  . URINARY INCONTINENCE 08/23/2009  . Vertigo 05/20/2011   Past Surgical History:  Procedure Laterality Date  . BUNIONECTOMY     right  . ccx    . CHOLECYSTECTOMY    . LUMBAR LAMINECTOMY    . LUMBAR LAMINECTOMY    . SHOULDER ARTHROSCOPY  12/13/2011   Procedure: ARTHROSCOPY SHOULDER;  Surgeon: Augustin Schooling;  Location: [facility];  Service: Orthopedics;  Laterality: Left;  Left Shoulder ArthroscopyDebridement Limited Tenodesis Open Rotator Cuff Repair Spur Removal Right Shoulder Injection     reports that she has quit smoking. She has a 10.00 pack-year smoking history. She has never used smokeless tobacco. She reports that she does not drink alcohol or use drugs. family history includes Colon cancer in her mother; Diabetes in her other; Diabetes type II in her father; Heart disease in her brother and father. Allergies  Allergen Reactions  . Aripiprazole     REACTION: agitation  . Simvastatin     REACTION: myalgia   Current Outpatient Prescriptions on File Prior to Visit  Medication Sig Dispense Refill  . aspirin 81 MG tablet Take 81 mg by mouth daily.      [name] atenolol (TENORMIN) 25 MG tablet TAKE 1 TABLET BY MOUTH DAILY 90 tablet 0  . atorvastatin (LIPITOR) 40 MG tablet Take 1 tablet (  40 mg total) by mouth daily. 90 tablet 3  . budesonide-formoterol (SYMBICORT) 160-4.5 MCG/ACT inhaler Inhale 2 puffs into the lungs 2 (two) times daily. 1 Inhaler 6  . calcium-vitamin D (OSCAL WITH D) 500-200 MG-UNIT tablet Take 4 tablets by mouth.     . cetirizine (ZYRTEC) 10 MG tablet Take 1 tablet (10 mg total) by mouth daily. 30 tablet 11  . Cholecalciferol (VITAMIN D) 2000 UNITS tablet Take 2,000 Units by mouth daily. Reported on 12/22/2015    . fluticasone (FLONASE) 50 MCG/ACT nasal spray Place 2 sprays into the nose daily as needed. For allergies 16 g 11  . hydrochlorothiazide (HYDRODIURIL) 25 MG tablet Take 0.5 tablets (12.5 mg  total) by mouth daily as needed. 45 tablet 3  . levothyroxine (SYNTHROID, LEVOTHROID) 50 MCG tablet Take 1 tablet (50 mcg total) by mouth daily. 90 tablet 3  . meclizine (ANTIVERT) 25 MG tablet Take 1 tablet (25 mg total) by mouth 3 (three) times daily as needed for dizziness. 50 tablet 1  . ondansetron (ZOFRAN ODT) 8 MG disintegrating tablet Take 1 tablet (8 mg total) by mouth every 8 (eight) hours as needed for nausea or vomiting. 40 tablet 0  . zaleplon (SONATA) 5 MG capsule 1 or 2 at bedtime for sleep as needed 30 capsule 5   No current facility-administered medications on file prior to visit.    Review of Systems  Constitutional: Negative for unusual diaphoresis or night sweats HENT: Negative for ear swelling or discharge Eyes: Negative for worsening visual haziness  Respiratory: Negative for choking and stridor.   Gastrointestinal: Negative for distension or worsening eructation Genitourinary: Negative for retention or change in urine volume.  Musculoskeletal: Negative for other MSK pain or swelling Skin: Negative for color change and worsening wound Neurological: Negative for tremors and numbness other than noted  Psychiatric/Behavioral: Negative for decreased concentration or agitation other than above       Objective:   Physical Exam BP 130/78   Pulse 98   Temp 98 F (36.7 C) (Oral)   Resp 20   Wt 246 lb 9 oz (111.8 kg)   SpO2 97%   BMI 39.20 kg/m  VS noted,  Constitutional: Pt appears in no apparent distress HENT: Head: NCAT.  Right Ear: External ear normal.  Left Ear: External ear normal.  Eyes: . Pupils are equal, round, and reactive to light. Conjunctivae and EOM are normal Neck: Normal range of motion. Neck supple.  Cardiovascular: Normal rate and regular rhythm.   Pulmonary/Chest: Effort normal and breath sounds without rales or wheezing.  Abd:  Soft, NT, ND, + BS Neurological: Pt is alert. Not confused , motor grossly intact Skin: Skin is warm. No rash, no  LE edema Psychiatric: Pt behavior is normal. No agitation. + depressed affect    Assessment & Plan:

## 2016-09-03 NOTE — Assessment & Plan Note (Signed)
stable overall by history and exam, recent data reviewed with pt, and pt to continue medical treatment as before,  to f/u any worsening symptoms or concerns Lab Results  Component Value Date   LDLCALC 61 08/22/2016

## 2016-09-03 NOTE — Patient Instructions (Signed)
Ok increase the metformin ER 500 mg to 4 tab in the AM  Please take all new medication as prescribed - the glipizide ER 2.5 mg per day  Please continue all other medications as before, and refills have been done if requested.  Please have the pharmacy call with any other refills you may need.  Please continue your efforts at being more active, low cholesterol diet, and weight control.  Please keep your appointments with your specialists as you may have planned  Please return in 6 months, or sooner if needed, with Lab testing done 3-5 days before

## 2016-09-30 ENCOUNTER — Telehealth: Payer: Self-pay | Admitting: Internal Medicine

## 2016-09-30 NOTE — Telephone Encounter (Signed)
Patient needs a replacement metoprolol RX called in because of the atenolol shortage. Pharmacy listed is correct

## 2016-10-01 MED ORDER — METOPROLOL SUCCINATE ER 25 MG PO TB24: 25.0000 mg | ORAL_TABLET | Freq: Every day | ORAL | 3 refills | Status: DC

## 2016-10-01 NOTE — Telephone Encounter (Signed)
Atenolol 25 changed to toprol xl 25

## 2016-10-02 ENCOUNTER — Telehealth: Payer: Self-pay | Admitting: *Deleted

## 2016-10-02 NOTE — Telephone Encounter (Signed)
Pt called to check status on alternative med for atenolol. Inform pt per chart MD sent in metoprolol on yesterday to [facility]...Alexandra Wolfe

## 2016-10-23 ENCOUNTER — Telehealth: Payer: Self-pay | Admitting: *Deleted

## 2016-10-23 NOTE — Telephone Encounter (Signed)
Called pharmacy spoke w/Chris & pharmacist Amy Knaperek clarified msg concerning the [place]. He stated that they where aware of the situation, and have spoken with the patient as well. Inform chris to void script which he stated has been taking off. Called pt spoke w/ son pt was not home. LMOM RTC...Alexandra Wolfe

## 2016-10-23 NOTE — Telephone Encounter (Signed)
Called pt back to verify msg below. Pt states when she went to pick her med up they gave her (2) medications one for metoprolol and Xarelto dose pack. She stated that she thought the xarelto was for her BP to replace the Atenolol. Inform pt no the only script MD sent was the Metoprolol 25mg . Ask pt to make sure her name was on the package, and she stated her name was name on the package at all. She has taking all but 8 days which tomorrow she had suppose to go up to 20 mg. She is going to contact [facility], but wanting to ask MD should she quit taking med & will it harm her...Alexandra Wolfe

## 2016-10-23 NOTE — Telephone Encounter (Signed)
Pt was not on xarelto in sept 2017 when last seen  Can she say why she was placed on this?

## 2016-10-23 NOTE — Telephone Encounter (Signed)
This sounds like an error at the pharmacy  Her pharmacy should be notified by this office of the error, and no further refills to be given  Please verify the prescribing MD on the xarelto as well, as maybe the pharmacy just put the wrong pt name on the label  OK for pt to simply stop the xarelto; it is a blood thinner and will be out of her system in 24 hrs.  Pt to call for any bleeding in the meantime or go to ER if more than just a small amount

## 2016-10-23 NOTE — Telephone Encounter (Signed)
Left msg on triage stating MD order for her to take Xarelto for 34 days. On the instructions it states to let MD know when you will be at day 22, tomorrow will make day 22. If she need to continue need rx sent to [facility]...Johny Chess

## 2016-10-24 NOTE — Telephone Encounter (Signed)
Pt return back the call on yesterday gave her MD response. She stated that she had also talked with the pharmacist at [facility]...Alexandra Wolfe

## 2016-11-19 ENCOUNTER — Other Ambulatory Visit: Payer: Self-pay | Admitting: Internal Medicine

## 2016-11-20 ENCOUNTER — Other Ambulatory Visit: Payer: Self-pay | Admitting: Internal Medicine

## 2016-11-20 ENCOUNTER — Telehealth: Payer: Self-pay | Admitting: Internal Medicine

## 2016-11-20 MED ORDER — ZALEPLON 5 MG PO CAPS: ORAL_CAPSULE | ORAL | 5 refills | Status: DC

## 2016-11-20 NOTE — Telephone Encounter (Signed)
Ok to refill 6 months 

## 2016-11-20 NOTE — Telephone Encounter (Signed)
Rx printed, signed and placed up front in brown folder for pick up. Pt aware & voiced understanding. Nothing further needed.

## 2016-11-20 NOTE — Telephone Encounter (Signed)
Pt requesting refill on zaleplon 30 tablets. Rx last refilled on 08-22-16. Pt last seen on 08-22-16 with up pending appointment for 02-18-17.  CY please advise on refill.  Current Outpatient Prescriptions on File Prior to Visit  Medication Sig Dispense Refill  . aspirin 81 MG tablet Take 81 mg by mouth daily.      [name] atorvastatin (LIPITOR) 40 MG tablet Take 1 tablet (40 mg total) by mouth daily. 90 tablet 3  . budesonide-formoterol (SYMBICORT) 160-4.5 MCG/ACT inhaler Inhale 2 puffs into the lungs 2 (two) times daily. 1 Inhaler 6  . calcium-vitamin D (OSCAL WITH D) 500-200 MG-UNIT tablet Take 4 tablets by mouth.     . cetirizine (ZYRTEC) 10 MG tablet Take 1 tablet (10 mg total) by mouth daily. 30 tablet 11  . Cholecalciferol (VITAMIN D) 2000 UNITS tablet Take 2,000 Units by mouth daily. Reported on 12/22/2015    . FLUoxetine (PROZAC) 20 MG tablet Take 1 tablet (20 mg total) by mouth daily. 90 tablet 3  . fluticasone (FLONASE) 50 MCG/ACT nasal spray Place 2 sprays into the nose daily as needed. For allergies 16 g 11  . glipiZIDE (GLIPIZIDE XL) 2.5 MG 24 hr tablet Take 1 tablet (2.5 mg total) by mouth daily with breakfast. 90 tablet 3  . hydrochlorothiazide (HYDRODIURIL) 25 MG tablet Take 0.5 tablets (12.5 mg total) by mouth daily as needed. 45 tablet 3  . levothyroxine (SYNTHROID, LEVOTHROID) 50 MCG tablet Take 1 tablet (50 mcg total) by mouth daily. 90 tablet 3  . meclizine (ANTIVERT) 25 MG tablet Take 1 tablet (25 mg total) by mouth 3 (three) times daily as needed for dizziness. 50 tablet 1  . metFORMIN (GLUCOPHAGE-XR) 500 MG 24 hr tablet 4 tabs by mouth every AM 360 tablet 3  . metoprolol succinate (TOPROL-XL) 25 MG 24 hr tablet Take 1 tablet (25 mg total) by mouth daily. 90 tablet 3  . ondansetron (ZOFRAN ODT) 8 MG disintegrating tablet Take 1 tablet (8 mg total) by mouth every 8 (eight) hours as needed for nausea or vomiting. 40 tablet 0  . zaleplon (SONATA) 5 MG capsule 1 or 2 at bedtime for  sleep as needed 30 capsule 5   No current facility-administered medications on file prior to visit.     Allergies  Allergen Reactions  . Aripiprazole     REACTION: agitation  . Simvastatin     REACTION: myalgia

## 2016-12-13 ENCOUNTER — Other Ambulatory Visit: Payer: Self-pay | Admitting: Internal Medicine

## 2016-12-29 ENCOUNTER — Other Ambulatory Visit: Payer: Self-pay | Admitting: Internal Medicine

## 2017-02-18 ENCOUNTER — Ambulatory Visit: Payer: Medicare Other | Admitting: Internal Medicine

## 2017-03-04 ENCOUNTER — Encounter: Payer: Self-pay | Admitting: Internal Medicine

## 2017-03-04 ENCOUNTER — Ambulatory Visit (INDEPENDENT_AMBULATORY_CARE_PROVIDER_SITE_OTHER): Payer: Medicare Other | Admitting: Internal Medicine

## 2017-03-04 ENCOUNTER — Other Ambulatory Visit (INDEPENDENT_AMBULATORY_CARE_PROVIDER_SITE_OTHER): Payer: Medicare Other

## 2017-03-04 VITALS — BP 124/68 | HR 89 | Temp 97.9°F | Ht 66.5 in | Wt 245.0 lb

## 2017-03-04 DIAGNOSIS — M25511 Pain in right shoulder: Secondary | ICD-10-CM | POA: Diagnosis not present

## 2017-03-04 DIAGNOSIS — Z0001 Encounter for general adult medical examination with abnormal findings: Secondary | ICD-10-CM

## 2017-03-04 DIAGNOSIS — M25562 Pain in left knee: Secondary | ICD-10-CM

## 2017-03-04 DIAGNOSIS — M25512 Pain in left shoulder: Secondary | ICD-10-CM | POA: Diagnosis not present

## 2017-03-04 DIAGNOSIS — J309 Allergic rhinitis, unspecified: Secondary | ICD-10-CM | POA: Diagnosis not present

## 2017-03-04 DIAGNOSIS — Z0000 Encounter for general adult medical examination without abnormal findings: Secondary | ICD-10-CM

## 2017-03-04 DIAGNOSIS — E119 Type 2 diabetes mellitus without complications: Secondary | ICD-10-CM

## 2017-03-04 LAB — CBC WITH DIFFERENTIAL/PLATELET
BASOS ABS: 0.1 10*3/uL (ref 0.0–0.1)
BASOS PCT: 0.8 % (ref 0.0–3.0)
EOS ABS: 0.2 10*3/uL (ref 0.0–0.7)
Eosinophils Relative: 1.5 % (ref 0.0–5.0)
HCT: 42.8 % (ref 36.0–46.0)
HEMOGLOBIN: 14.5 g/dL (ref 12.0–15.0)
Lymphocytes Relative: 35.7 % (ref 12.0–46.0)
Lymphs Abs: 4.3 10*3/uL — ABNORMAL HIGH (ref 0.7–4.0)
MCHC: 33.8 g/dL (ref 30.0–36.0)
MCV: 86.7 fl (ref 78.0–100.0)
MONOS PCT: 5.4 % (ref 3.0–12.0)
Monocytes Absolute: 0.6 10*3/uL (ref 0.1–1.0)
NEUTROS ABS: 6.8 10*3/uL (ref 1.4–7.7)
Neutrophils Relative %: 56.6 % (ref 43.0–77.0)
PLATELETS: 388 10*3/uL (ref 150.0–400.0)
RBC: 4.94 Mil/uL (ref 3.87–5.11)
RDW: 13.4 % (ref 11.5–15.5)
WBC: 11.9 10*3/uL — AB (ref 4.0–10.5)

## 2017-03-04 LAB — URINALYSIS, ROUTINE W REFLEX MICROSCOPIC
Bilirubin Urine: NEGATIVE
Hgb urine dipstick: NEGATIVE
Ketones, ur: NEGATIVE
Leukocytes, UA: NEGATIVE
Nitrite: NEGATIVE
RBC / HPF: NONE SEEN (ref 0–?)
Specific Gravity, Urine: 1.02 (ref 1.000–1.030)
Total Protein, Urine: NEGATIVE
UROBILINOGEN UA: 0.2 (ref 0.0–1.0)
Urine Glucose: NEGATIVE
pH: 5.5 (ref 5.0–8.0)

## 2017-03-04 LAB — HEPATIC FUNCTION PANEL
ALBUMIN: 4.3 g/dL (ref 3.5–5.2)
ALT: 15 U/L (ref 0–35)
AST: 16 U/L (ref 0–37)
Alkaline Phosphatase: 92 U/L (ref 39–117)
BILIRUBIN TOTAL: 0.5 mg/dL (ref 0.2–1.2)
Bilirubin, Direct: 0 mg/dL (ref 0.0–0.3)
Total Protein: 7.2 g/dL (ref 6.0–8.3)

## 2017-03-04 LAB — LIPID PANEL
CHOL/HDL RATIO: 4
CHOLESTEROL: 133 mg/dL (ref 0–200)
HDL: 35.2 mg/dL — AB (ref >39.00)
LDL CALC: 71 mg/dL (ref 0–99)
NonHDL: 97.51
TRIGLYCERIDES: 131 mg/dL (ref 0.0–149.0)
VLDL: 26.2 mg/dL (ref 0.0–40.0)

## 2017-03-04 LAB — BASIC METABOLIC PANEL
BUN: 15 mg/dL (ref 6–23)
CO2: 25 mEq/L (ref 19–32)
CREATININE: 0.74 mg/dL (ref 0.40–1.20)
Calcium: 9.5 mg/dL (ref 8.4–10.5)
Chloride: 101 mEq/L (ref 96–112)
GFR: 82.14 mL/min (ref >60.00)
GLUCOSE: 149 mg/dL — AB (ref 70–99)
Potassium: 4.5 mEq/L (ref 3.5–5.1)
Sodium: 136 mEq/L (ref 135–145)

## 2017-03-04 LAB — MICROALBUMIN / CREATININE URINE RATIO
Creatinine,U: 123 mg/dL
Microalb Creat Ratio: 0.6 mg/g (ref 0.0–30.0)
Microalb, Ur: <0.7 mg/dL (ref 0.0–1.9)

## 2017-03-04 LAB — HEMOGLOBIN A1C: Hgb A1c MFr Bld: 6.9 % — ABNORMAL HIGH (ref 4.6–6.5)

## 2017-03-04 LAB — TSH: TSH: 2.38 u[IU]/mL (ref 0.35–4.50)

## 2017-03-04 MED ORDER — LEVOTHYROXINE SODIUM 50 MCG PO TABS
ORAL_TABLET | ORAL | 3 refills | Status: DC
Start: 2017-03-04 — End: 2018-02-18

## 2017-03-04 MED ORDER — HYDROCHLOROTHIAZIDE 25 MG PO TABS: ORAL_TABLET | ORAL | 3 refills | Status: DC

## 2017-03-04 MED ORDER — CETIRIZINE HCL 10 MG PO TABS: 10.0000 mg | ORAL_TABLET | Freq: Every day | ORAL | 11 refills | Status: DC

## 2017-03-04 MED ORDER — METFORMIN HCL ER 500 MG PO TB24: ORAL_TABLET | ORAL | 3 refills | Status: DC

## 2017-03-04 MED ORDER — ATORVASTATIN CALCIUM 40 MG PO TABS: ORAL_TABLET | ORAL | 3 refills | Status: DC

## 2017-03-04 MED ORDER — TRAMADOL HCL 50 MG PO TABS
50.0000 mg | ORAL_TABLET | Freq: Four times a day (QID) | ORAL | 0 refills | Status: DC | PRN
Start: 2017-03-04 — End: 2018-04-06

## 2017-03-04 MED ORDER — BUDESONIDE-FORMOTEROL FUMARATE 160-4.5 MCG/ACT IN AERO: 2.0000 | INHALATION_SPRAY | Freq: Two times a day (BID) | RESPIRATORY_TRACT | 6 refills | Status: DC

## 2017-03-04 MED ORDER — FLUTICASONE PROPIONATE 50 MCG/ACT NA SUSP: 2.0000 | Freq: Every day | NASAL | 11 refills | Status: DC

## 2017-03-04 MED ORDER — GLIPIZIDE ER 2.5 MG PO TB24: 2.5000 mg | ORAL_TABLET | Freq: Every day | ORAL | 3 refills | Status: DC

## 2017-03-04 MED ORDER — FLUOXETINE HCL 20 MG PO TABS: 20.0000 mg | ORAL_TABLET | Freq: Every day | ORAL | 3 refills | Status: DC

## 2017-03-04 MED ORDER — METOPROLOL SUCCINATE ER 25 MG PO TB24: 25.0000 mg | ORAL_TABLET | Freq: Every day | ORAL | 3 refills | Status: DC

## 2017-03-04 MED ORDER — ESZOPICLONE 2 MG PO TABS: 2.0000 mg | ORAL_TABLET | Freq: Every evening | ORAL | 2 refills | Status: DC | PRN

## 2017-03-04 NOTE — Progress Notes (Signed)
Subjective:    Patient ID: Alexandra Wolfe, female    DOB: [birth year], 71 y.o.   MRN: [id number]  HPI  Here for wellness and f/u;  Overall doing ok;  Pt denies Chest pain, worsening SOB, DOE, wheezing, orthopnea, PND, worsening LE edema, palpitations, dizziness or syncope.  Pt denies neurological change such as new headache, facial or extremity weakness.  Pt denies polydipsia, polyuria, or low sugar symptoms. Pt states overall good compliance with treatment and medications, good tolerability, and has been trying to follow appropriate diet.  Pt denies worsening depressive symptoms, suicidal ideation or panic. No fever, night sweats, wt loss, loss of appetite, or other constitutional symptoms.  Pt states good ability with ADL's, has low fall risk, home safety reviewed and adequate, no other significant changes in hearing or vision, and only occasionally active with exercise.  Due for optho follow up and states plans to call.  Also c/o bilat shoulder pain she believes due to bone spurs - plans to see Dr Lonn Georgia for this., also pain left knee, left hip, hobbles to walk, stiff to start, better to keep walking.Only started a few mo ago. Mod to severe, did have a fall last wk in the kitchen, no injury. Past Medical History:  Diagnosis Date  . ALLERGIC RHINITIS 04/02/2009   no per pt  . Anxiety   . BACK PAIN 09/27/2008  . BUNIONS, BILATERAL 12/23/2007  . CHEST DISCOMFORT, ATYPICAL 11/07/2009  . Chronic LBP   . COLONIC POLYPS, HX OF 09/13/2007  . CONSTIPATION 09/27/2008  . DEPRESSION 09/13/2007  . Diabetes mellitus    diet controlled  . DYSPNEA ON EXERTION 02/06/2010  . Eustachian tube dysfunction 05/20/2011  . FOOT PAIN, RIGHT 09/27/2008  . HYPERLIPIDEMIA 03/11/2008  . LBP (low back pain) 05/20/2011  . Leukocytosis 11/20/2011  . OSTEOARTHROSIS NOS, LOWER LEG 09/13/2007  . SHOULDER PAIN, LEFT 02/14/2009  . SVT (supraventricular tachycardia) ([place])   . SYMPTOM, PALPITATIONS 09/13/2007  . URINARY  INCONTINENCE 08/23/2009  . Vertigo 05/20/2011   Past Surgical History:  Procedure Laterality Date  . BUNIONECTOMY     right  . ccx    . CHOLECYSTECTOMY    . LUMBAR LAMINECTOMY    . LUMBAR LAMINECTOMY    . SHOULDER ARTHROSCOPY  12/13/2011   Procedure: ARTHROSCOPY SHOULDER;  Surgeon: Augustin Schooling;  Location: [place];  Service: Orthopedics;  Laterality: Left;  Left Shoulder ArthroscopyDebridement Limited Tenodesis Open Rotator Cuff Repair Spur Removal Right Shoulder Injection     reports that she has quit smoking. She has a 10.00 pack-year smoking history. She has never used smokeless tobacco. She reports that she does not drink alcohol or use drugs. family history includes Colon cancer in her mother; Diabetes in her other; Diabetes type II in her father; Heart disease in her brother and father. Allergies  Allergen Reactions  . Aripiprazole     REACTION: agitation  . Simvastatin     REACTION: myalgia   Current Outpatient Prescriptions on File Prior to Visit  Medication Sig Dispense Refill  . aspirin 81 MG tablet Take 81 mg by mouth daily.      . calcium-vitamin D (OSCAL WITH D) 500-200 MG-UNIT tablet Take 4 tablets by mouth.     . Cholecalciferol (VITAMIN D) 2000 UNITS tablet Take 2,000 Units by mouth daily. Reported on 12/22/2015    . meclizine (ANTIVERT) 25 MG tablet Take 1 tablet (25 mg total) by mouth 3 (three) times daily as needed for dizziness. 50 tablet 1  .  ondansetron (ZOFRAN ODT) 8 MG disintegrating tablet Take 1 tablet (8 mg total) by mouth every 8 (eight) hours as needed for nausea or vomiting. 40 tablet 0   No current facility-administered medications on file prior to visit.     Review of Systems Constitutional: Negative for other unusual diaphoresis, sweats, appetite or weight changes HENT: Negative for other worsening hearing loss, ear pain, facial swelling, mouth sores or neck stiffness.   Eyes: Negative for other worsening pain, redness or other visual disturbance.    Respiratory: Negative for other stridor or swelling Cardiovascular: Negative for other palpitations or other chest pain  Gastrointestinal: Negative for worsening diarrhea or loose stools, blood in stool, distention or other pain Genitourinary: Negative for hematuria, flank pain or other change in urine volume.  Musculoskeletal: Negative for myalgias or other joint swelling.  Skin: Negative for other color change, or other wound or worsening drainage.  Neurological: Negative for other syncope or numbness. Hematological: Negative for other adenopathy or swelling Psychiatric/Behavioral: Negative for hallucinations, other worsening agitation, SI, self-injury, or new decreased concentration All other system neg per pt    Objective:   Physical Exam BP 124/68   Pulse 89   Temp 97.9 F (36.6 C) (Oral)   Ht 5' 6.5" (1.689 m)   Wt 245 lb (111.1 kg)   SpO2 98%   BMI 38.95 kg/m  VS noted, not ill appearing Constitutional: Pt is oriented to person, place, and time. Appears well-developed and well-nourished, in no significant distress and comfortable Head: Normocephalic and atraumatic  Eyes: Conjunctivae and EOM are normal. Pupils are equal, round, and reactive to light Right Ear: External ear normal without discharge Left Ear: External ear normal without discharge Nose: Nose without discharge or deformity Mouth/Throat: Oropharynx is without other ulcerations and moist  Neck: Normal range of motion. Neck supple. No JVD present. No tracheal deviation present or significant neck LA or mass Cardiovascular: Normal rate, regular rhythm, normal heart sounds and intact distal pulses.   Pulmonary/Chest: WOB normal and breath sounds without rales or wheezing  Abdominal: Soft. Bowel sounds are normal. NT. No HSM  Musculoskeletal: Normal range of motion. Exhibits no edema except for left knee mod degenerative changes with effusion and limps to walk Lymphadenopathy: Has no other cervical adenopathy.   Neurological: Pt is alert and oriented to person, place, and time. Pt has normal reflexes. No cranial nerve deficit. Motor grossly intact, Gait intact Skin: Skin is warm and dry. No rash noted or new ulcerations Psychiatric:  Has normal mood and affect. Behavior is normal without agitation No other exam findings  ECG I have personally interpreted today Sinus  Rhythm - WNL     Assessment & Plan:

## 2017-03-04 NOTE — Progress Notes (Signed)
Pre visit review using our clinic review tool, if applicable. No additional management support is needed unless otherwise documented below in the visit note. 

## 2017-03-04 NOTE — Patient Instructions (Addendum)
OK to change the sonata to the lunesta  Please take all new medication as prescribed - the tramadol for paim  Please continue all other medications as before, and refills have been done if requested, including a month of the symbicort to get back to Dr Annamaria Boots  Please have the pharmacy call with any other refills you may need.  Please continue your efforts at being more active, low cholesterol diet, and weight control.  You are otherwise up to date with prevention measures today.  Please keep your appointments with your specialists as you may have planned  You will be contacted regarding the referral for: Dr Tamala Julian for the left knee and hip, and eye doctor for yearly exam  Your blood work was done earlier today  You will be contacted by phone if any changes need to be made immediately.  Otherwise, you will receive a letter about your results with an explanation, but please check with MyChart first.  Please remember to sign up for MyChart if you have not done so, as this will be important to you in the future with finding out test results, communicating by private email, and scheduling acute appointments online when needed.  Please return in 6 months, or sooner if needed, with Lab testing done 3-5 days before

## 2017-03-08 NOTE — Assessment & Plan Note (Signed)
Mod to severe, for pain control, f/u ortho as planned

## 2017-03-08 NOTE — Assessment & Plan Note (Signed)
Etiology unclear, for tramadol prn, ortho f/u as planned

## 2017-03-08 NOTE — Assessment & Plan Note (Signed)

## 2017-03-08 NOTE — Assessment & Plan Note (Signed)
stable overall by history and exam, recent data reviewed with pt, and pt to continue medical treatment as before,  to f/u any worsening symptoms or concerns Lab Results  Component Value Date   HGBA1C 6.9 (H) 03/04/2017

## 2017-04-02 ENCOUNTER — Encounter: Payer: Self-pay | Admitting: Internal Medicine

## 2017-04-02 ENCOUNTER — Telehealth: Payer: Self-pay | Admitting: Internal Medicine

## 2017-04-02 DIAGNOSIS — M25562 Pain in left knee: Secondary | ICD-10-CM | POA: Diagnosis not present

## 2017-04-02 DIAGNOSIS — M19011 Primary osteoarthritis, right shoulder: Secondary | ICD-10-CM | POA: Diagnosis not present

## 2017-04-02 NOTE — Telephone Encounter (Signed)
Pt would like to know what her Hemo A1C is please call back  Test done 3/27

## 2017-04-02 NOTE — Telephone Encounter (Signed)
Pt was informed of her A1C

## 2017-04-03 ENCOUNTER — Ambulatory Visit (INDEPENDENT_AMBULATORY_CARE_PROVIDER_SITE_OTHER): Payer: Medicare Other | Admitting: Internal Medicine

## 2017-04-03 ENCOUNTER — Encounter: Payer: Self-pay | Admitting: Internal Medicine

## 2017-04-03 VITALS — BP 128/74 | Ht 66.5 in | Wt 243.2 lb

## 2017-04-03 DIAGNOSIS — J3089 Other allergic rhinitis: Secondary | ICD-10-CM

## 2017-04-03 DIAGNOSIS — G4733 Obstructive sleep apnea (adult) (pediatric): Secondary | ICD-10-CM | POA: Diagnosis not present

## 2017-04-03 DIAGNOSIS — J302 Other seasonal allergic rhinitis: Secondary | ICD-10-CM | POA: Diagnosis not present

## 2017-04-03 MED ORDER — BUDESONIDE-FORMOTEROL FUMARATE 160-4.5 MCG/ACT IN AERO: 2.0000 | INHALATION_SPRAY | Freq: Two times a day (BID) | RESPIRATORY_TRACT | 12 refills | Status: DC

## 2017-04-03 NOTE — Patient Instructions (Signed)
Order- [facility]- Continue CPAP auto 5-20, mask of choice, humidifier, supplies, AirView  Dx OSA                   Needs to be refitted for mask please- suggest trying nasal pillows, with supplies  Suggest you try using otc Flonase nasal spray   1-2 puffs each nostril, once daily at bedtime for at least 2 weeks. See if this helps with the nasal stuffiness.

## 2017-04-03 NOTE — Progress Notes (Signed)
HPI female former smoker followed for OSA, complicated by allergic rhinitis, asthma, hyperthyroid, DM 2 Unattended Home Sleep Test- 05/07/2016-moderate obstructive sleep apnea-AHI 18.4/hour, desaturation to 79%, body weight 239.5 pounds  PFT-04/08/2016-moderate reduction of diffusion capacity at 59%, normal spirometry flows, insignificant response to bronchodilator, TLC 95% Leukocytosis- sustained leukocytosis with WBC climbing from 12,500 on 12/22/2015 to 15,500 on 04/04/2016  --------------------------------------------------------------------------------------------------------------  05/22/2016-71 year old female former smoker followed for OSA FOLLOWS ZOX:WRUEAV HST, PFT, Labwork, and CT angio results with patient.  Unattended Home Sleep Test- 05/07/2016-moderate obstructive sleep apnea-AHI 18.4/hour, desaturation to 79%, body weight 239.5 pounds  PFT-04/08/2016-moderate reduction of diffusion capacity at 59%, normal spirometry flows, insignificant response to bronchodilator, TLC 95% Lab-sustained leukocytosis with WBC climbing from 12,500 on 12/22/2015 to 15,500 on 04/04/2016. Hemoglobin 15.4, d-dimer 0.23, BNP 36 CTa chest-04/05/2016 IMPRESSION: 1. No pulmonary emboli or other acute abnormalities. 2. Bilateral breast nodules. These are nonspecific. Has the patient had a recent mammogram?- 04/16/16 3. Aortic atherosclerosis. Electronically Signed  By: Lorriane Shire M.D.  On: 04/05/2016 11:10 Patient reports occasional mild wheeze on exertion but no phlegm and no acute process.  08/22/2016-71 year old female former smoker followed for OSA CPAP auto 5-20/[facility] FOLLOWS FOR: D[facility]; will need order for new supplies. DL attached and pt wears CPAP for at least 4 hours nighlty. Still trying to get used to wearing her fullface mask. Discussed alternatives. She identifies as a "mouth breather". Some difficulty initiating sleep. She is alone and unable to report on any change in  snoring.  Download-93% compliance with 4 hour rule, AHI 0.3 indicating good control.  04/03/17-71 year old female former smoker followed for OSA, complicated by allergic rhinitis, asthma, hyperthyroid, DM 2 FOLLOWS FOR DME [facility] DL ATTACHED SUPPLIES NEEDED PATIENT IS NOT WEARING CPAP EVERY NIGHT Due TO THE MACHINE BEENING BOTHERSOME She is finding her fullface mask annoying. Aware that she is a mouth breather much of the time but can breathe through her nose with intention. Compliance 43% 4 hour, AHI 0.8/hour. Steroid injections for arthritis associated with some restlessness. Mild intermittent asthma. Asks refill Symbicort. Very occasional use of rescue inhaler.  ROS-see HPI   Negative unless "+" Constitutional:    weight loss, night sweats, fevers, chills,+ fatigue, lassitude. HEENT:    headaches, difficulty swallowing, tooth/dental problems, sore throat,       sneezing, itching, ear ache, nasal congestion, post nasal drip, snoring CV:    chest pain, orthopnea, PND, + swelling in lower extremities, anasarca,                                                     dizziness, palpitations Resp:   + shortness of breath with exertion or at rest.                productive cough,   non-productive cough, coughing up of blood.              change in color of mucus.  + wheezing.   Skin:    rash or lesions. GI:  No-   heartburn, indigestion, abdominal pain, nausea, vomiting, diarrhea,                 change in bowel habits, loss of appetite GU: dysuria, change in color of urine, no urgency or frequency.   flank pain. MS:   joint pain, stiffness, decreased range of  motion, back pain. Neuro-     nothing unusual Psych:  change in mood or affect.  depression or anxiety.   memory loss.  OBJ- Physical Exam General- Alert, Oriented, Affect-appropriate, Distress- none acute, + obese Skin- rash-none, lesions- none, excoriation- none Lymphadenopathy- none Head- atraumatic            Eyes- Gross vision intact,  PERRLA, conjunctivae and secretions clear            Ears- Hearing, canals-normal            Nose- Clear, no-Septal dev, mucus, polyps, erosion, perforation             Throat- Mallampati III-IV , mucosa clear , drainage- none, tonsils- atrophic Neck- flexible , trachea midline, no stridor , thyroid nl, carotid no bruit Chest - symmetrical excursion , unlabored           Heart/CV- RRR , no murmur , no gallop  , no rub, nl s1 s2                           - JVD- none , edema- none, stasis changes- none, varices- none           Lung-  wheeze + slight, cough- none , dullness-none, rub- none,            Chest wall-  Abd-  Br/ Gen/ Rectal- Not done, not indicated Extrem- cyanosis- none, clubbing, none, atrophy- none, strength- nl.  Neuro- grossly intact to observation

## 2017-04-04 NOTE — Assessment & Plan Note (Signed)
We discussed comfort barriers to full compliance and reviewed compliance goals. Plan-continue CPAP with encouragement. DME to refit mask

## 2017-04-04 NOTE — Assessment & Plan Note (Signed)
She is focused on her mouth breathing but seems able to breathe through her nose if distracted. No visible anterior polyps. Plan-try regular use of Flonase every night

## 2017-04-21 DIAGNOSIS — Z1231 Encounter for screening mammogram for malignant neoplasm of breast: Secondary | ICD-10-CM | POA: Diagnosis not present

## 2017-04-21 LAB — HM MAMMOGRAPHY

## 2017-04-22 DIAGNOSIS — G4733 Obstructive sleep apnea (adult) (pediatric): Secondary | ICD-10-CM | POA: Diagnosis not present

## 2017-05-02 DIAGNOSIS — H5213 Myopia, bilateral: Secondary | ICD-10-CM | POA: Diagnosis not present

## 2017-05-02 LAB — HM DIABETES EYE EXAM

## 2017-05-26 ENCOUNTER — Telehealth: Payer: Self-pay | Admitting: Internal Medicine

## 2017-05-26 NOTE — Telephone Encounter (Signed)
Pt would like a prescription for arthritis called in, it would be a new medication, I informed her she may need an appt to be prescribed something, she states she was here in march for a CPE, I asked if she discussed this issue with Dr, Jenny Reichmann she said no, I told her I would still send a message back, she states her shoulders hands legs and hips hurt and it is hard to walk so she would like a prescription  Please advise     [facility] on [address]

## 2017-05-27 MED ORDER — MELOXICAM 7.5 MG PO TABS: 7.5000 mg | ORAL_TABLET | Freq: Every day | ORAL | 5 refills | Status: DC | PRN

## 2017-05-27 NOTE — Telephone Encounter (Signed)
Ok for mobic 7.5 qd prn - done erx

## 2017-05-27 NOTE — Telephone Encounter (Signed)
Pt has been informed.

## 2017-07-09 DIAGNOSIS — M19011 Primary osteoarthritis, right shoulder: Secondary | ICD-10-CM | POA: Diagnosis not present

## 2017-07-09 DIAGNOSIS — M1712 Unilateral primary osteoarthritis, left knee: Secondary | ICD-10-CM | POA: Diagnosis not present

## 2017-08-23 ENCOUNTER — Other Ambulatory Visit: Payer: Self-pay | Admitting: Internal Medicine

## 2017-09-01 ENCOUNTER — Other Ambulatory Visit (INDEPENDENT_AMBULATORY_CARE_PROVIDER_SITE_OTHER): Payer: Medicare Other

## 2017-09-01 DIAGNOSIS — E119 Type 2 diabetes mellitus without complications: Secondary | ICD-10-CM

## 2017-09-01 LAB — HEPATIC FUNCTION PANEL
ALK PHOS: 92 U/L (ref 39–117)
ALT: 13 U/L (ref 0–35)
AST: 12 U/L (ref 0–37)
Albumin: 4.3 g/dL (ref 3.5–5.2)
BILIRUBIN DIRECT: 0.1 mg/dL (ref 0.0–0.3)
BILIRUBIN TOTAL: 0.4 mg/dL (ref 0.2–1.2)
Total Protein: 7.1 g/dL (ref 6.0–8.3)

## 2017-09-01 LAB — BASIC METABOLIC PANEL
BUN: 11 mg/dL (ref 6–23)
CALCIUM: 9.8 mg/dL (ref 8.4–10.5)
CO2: 26 mEq/L (ref 19–32)
CREATININE: 0.72 mg/dL (ref 0.40–1.20)
Chloride: 104 mEq/L (ref 96–112)
GFR: 84.66 mL/min (ref >60.00)
Glucose, Bld: 154 mg/dL — ABNORMAL HIGH (ref 70–99)
Potassium: 4.8 mEq/L (ref 3.5–5.1)
SODIUM: 139 meq/L (ref 135–145)

## 2017-09-01 LAB — LIPID PANEL
CHOL/HDL RATIO: 3
Cholesterol: 144 mg/dL (ref 0–200)
HDL: 43.8 mg/dL (ref >39.00)
NONHDL: 99.94
TRIGLYCERIDES: 225 mg/dL — AB (ref 0.0–149.0)
VLDL: 45 mg/dL — ABNORMAL HIGH (ref 0.0–40.0)

## 2017-09-01 LAB — HEMOGLOBIN A1C: Hgb A1c MFr Bld: 5.8 % (ref 4.6–6.5)

## 2017-09-01 LAB — LDL CHOLESTEROL, DIRECT: LDL DIRECT: 74 mg/dL

## 2017-09-03 ENCOUNTER — Ambulatory Visit (INDEPENDENT_AMBULATORY_CARE_PROVIDER_SITE_OTHER): Payer: Medicare Other | Admitting: Internal Medicine

## 2017-09-03 ENCOUNTER — Encounter: Payer: Self-pay | Admitting: Internal Medicine

## 2017-09-03 VITALS — BP 128/82 | HR 79 | Temp 97.8°F | Ht 66.5 in | Wt 249.0 lb

## 2017-09-03 DIAGNOSIS — E119 Type 2 diabetes mellitus without complications: Secondary | ICD-10-CM

## 2017-09-03 DIAGNOSIS — F32A Depression, unspecified: Secondary | ICD-10-CM

## 2017-09-03 DIAGNOSIS — E785 Hyperlipidemia, unspecified: Secondary | ICD-10-CM | POA: Diagnosis not present

## 2017-09-03 DIAGNOSIS — Z23 Encounter for immunization: Secondary | ICD-10-CM

## 2017-09-03 DIAGNOSIS — G4733 Obstructive sleep apnea (adult) (pediatric): Secondary | ICD-10-CM | POA: Diagnosis not present

## 2017-09-03 DIAGNOSIS — Z0000 Encounter for general adult medical examination without abnormal findings: Secondary | ICD-10-CM

## 2017-09-03 DIAGNOSIS — F329 Major depressive disorder, single episode, unspecified: Secondary | ICD-10-CM | POA: Diagnosis not present

## 2017-09-03 NOTE — Progress Notes (Signed)
Subjective:    Patient ID: Alexandra Wolfe, female    DOB: 1946-09-13, 71 y.o.   MRN: [id number]  HPI  Here to f/u; overall doing ok,  Pt denies chest pain, increasing sob or doe, wheezing, orthopnea, PND, increased LE swelling, palpitations, dizziness or syncope.  Pt denies new neurological symptoms such as new headache, or facial or extremity weakness or numbness.  Pt denies polydipsia, polyuria, but has had several low sugar episode recently on current meds as she has really been working on diet, and lost 5 lbs.  Pt states overall good compliance with meds, mostly trying to follow appropriate diet  but little exercise however, due to chronic worsening bilat knee pain, sees [place] ortho. Past Medical History:  Diagnosis Date  . ALLERGIC RHINITIS 04/02/2009   no per pt  . Anxiety   . BACK PAIN 09/27/2008  . BUNIONS, BILATERAL 12/23/2007  . CHEST DISCOMFORT, ATYPICAL 11/07/2009  . Chronic LBP   . COLONIC POLYPS, HX OF 09/13/2007  . CONSTIPATION 09/27/2008  . DEPRESSION 09/13/2007  . Diabetes mellitus    diet controlled  . DYSPNEA ON EXERTION 02/06/2010  . Eustachian tube dysfunction 05/20/2011  . FOOT PAIN, RIGHT 09/27/2008  . HYPERLIPIDEMIA 03/11/2008  . LBP (low back pain) 05/20/2011  . Leukocytosis 11/20/2011  . OSTEOARTHROSIS NOS, LOWER LEG 09/13/2007  . SHOULDER PAIN, LEFT 02/14/2009  . SVT (supraventricular tachycardia) ([place])   . SYMPTOM, PALPITATIONS 09/13/2007  . URINARY INCONTINENCE 08/23/2009  . Vertigo 05/20/2011   Past Surgical History:  Procedure Laterality Date  . BUNIONECTOMY     right  . ccx    . CHOLECYSTECTOMY    . LUMBAR LAMINECTOMY    . LUMBAR LAMINECTOMY    . SHOULDER ARTHROSCOPY  12/13/2011   Procedure: ARTHROSCOPY SHOULDER;  Surgeon: Augustin Schooling;  Location: [place];  Service: Orthopedics;  Laterality: Left;  Left Shoulder ArthroscopyDebridement Limited Tenodesis Open Rotator Cuff Repair Spur Removal Right Shoulder Injection     reports that she has quit smoking. She  has a 10.00 pack-year smoking history. She has never used smokeless tobacco. She reports that she does not drink alcohol or use drugs. family history includes Colon cancer in her mother; Diabetes in her other; Diabetes type II in her father; Heart disease in her brother and father. Allergies  Allergen Reactions  . Aripiprazole     REACTION: agitation  . Simvastatin     REACTION: myalgia   Current Outpatient Prescriptions on File Prior to Visit  Medication Sig Dispense Refill  . aspirin 81 MG tablet Take 81 mg by mouth daily.      [name] atorvastatin (LIPITOR) 40 MG tablet TAKE 1 TABLET(40 MG) BY MOUTH DAILY 90 tablet 3  . atorvastatin (LIPITOR) 40 MG tablet TAKE 1 TABLET(40 MG) BY MOUTH DAILY 90 tablet 1  . budesonide-formoterol (SYMBICORT) 160-4.5 MCG/ACT inhaler Inhale 2 puffs into the lungs 2 (two) times daily. 1 Inhaler 12  . cetirizine (ZYRTEC) 10 MG tablet Take 1 tablet (10 mg total) by mouth daily. 30 tablet 11  . eszopiclone (LUNESTA) 2 MG TABS tablet Take 1 tablet (2 mg total) by mouth at bedtime as needed for sleep. Take immediately before bedtime 30 tablet 2  . fluticasone (FLONASE) 50 MCG/ACT nasal spray Place 2 sprays into both nostrils daily. For allergies 16 g 11  . glipiZIDE (GLIPIZIDE XL) 2.5 MG 24 hr tablet Take 1 tablet (2.5 mg total) by mouth daily with breakfast. 90 tablet 3  . hydrochlorothiazide (HYDRODIURIL) 25  MG tablet TAKE 1/2 TABLET(12.5 MG) BY MOUTH DAILY AS NEEDED 45 tablet 3  . levothyroxine (SYNTHROID, LEVOTHROID) 50 MCG tablet TAKE 1 TABLET(50 MCG) BY MOUTH DAILY 90 tablet 3  . meclizine (ANTIVERT) 25 MG tablet Take 1 tablet (25 mg total) by mouth 3 (three) times daily as needed for dizziness. 50 tablet 1  . meloxicam (MOBIC) 7.5 MG tablet Take 1 tablet (7.5 mg total) by mouth daily as needed for pain. 30 tablet 5  . metFORMIN (GLUCOPHAGE-XR) 500 MG 24 hr tablet 4 tabs by mouth every AM 360 tablet 3  . metoprolol succinate (TOPROL-XL) 25 MG 24 hr tablet Take 1  tablet (25 mg total) by mouth daily. 90 tablet 3  . ondansetron (ZOFRAN ODT) 8 MG disintegrating tablet Take 1 tablet (8 mg total) by mouth every 8 (eight) hours as needed for nausea or vomiting. 40 tablet 0  . traMADol (ULTRAM) 50 MG tablet Take 1 tablet (50 mg total) by mouth every 6 (six) hours as needed. 120 tablet 0   No current facility-administered medications on file prior to visit.    Review of Systems  Constitutional: Negative for other unusual diaphoresis or sweats HENT: Negative for ear discharge or swelling Eyes: Negative for other worsening visual disturbances Respiratory: Negative for stridor or other swelling  Gastrointestinal: Negative for worsening distension or other blood Genitourinary: Negative for retention or other urinary change Musculoskeletal: Negative for other MSK pain or swelling Skin: Negative for color change or other new lesions Neurological: Negative for worsening tremors and other numbness  Psychiatric/Behavioral: Negative for worsening agitation or other fatigue All other system neg per pt    Objective:   Physical Exam BP 128/82   Pulse 79   Temp 97.8 F (36.6 C) (Oral)   Ht 5' 6.5" (1.689 m)   Wt 249 lb (112.9 kg)   SpO2 100%   BMI 39.59 kg/m  VS noted,  Constitutional: Pt appears in NAD HENT: Head: NCAT.  Right Ear: External ear normal.  Left Ear: External ear normal.  Eyes: . Pupils are equal, round, and reactive to light. Conjunctivae and EOM are normal Nose: without d/c or deformity Neck: Neck supple. Gross normal ROM Cardiovascular: Normal rate and regular rhythm.   Pulmonary/Chest: Effort normal and breath sounds without rales or wheezing.  Neurological: Pt is alert. At baseline orientation, motor grossly intact Skin: Skin is warm. No rashes, other new lesions, no LE edema Psychiatric: Pt behavior is normal without agitation , mild nervous No other exam findings Lab Results  Component Value Date   WBC 11.9 (H) 03/04/2017   HGB  14.5 03/04/2017   HCT 42.8 03/04/2017   PLT 388.0 03/04/2017   GLUCOSE 154 (H) 09/01/2017   CHOL 144 09/01/2017   TRIG 225.0 (H) 09/01/2017   HDL 43.80 09/01/2017   LDLDIRECT 74.0 09/01/2017   LDLCALC 71 03/04/2017   ALT 13 09/01/2017   AST 12 09/01/2017   NA 139 09/01/2017   K 4.8 09/01/2017   CL 104 09/01/2017   CREATININE 0.72 09/01/2017   BUN 11 09/01/2017   CO2 26 09/01/2017   TSH 2.38 03/04/2017   INR 1.0 ratio 11/13/2009   HGBA1C 5.8 09/01/2017   MICROALBUR <0.7 03/04/2017      Assessment & Plan:

## 2017-09-03 NOTE — Patient Instructions (Signed)
Ok to stop the glipizide  Please continue all other medications as before, and refills have been done if requested.  Please have the pharmacy call with any other refills you may need.  Please continue your efforts at being more active, low cholesterol diabetic diet, and weight control  Please keep your appointments with your specialists as you may have planned  Please return in 6 months, or sooner if needed, with Lab testing done 3-5 days before

## 2017-09-06 NOTE — Assessment & Plan Note (Signed)
Mild overcontrolled, to d/c glipzide,  to f/u any worsening symptoms or concerns

## 2017-09-06 NOTE — Assessment & Plan Note (Signed)
Lab Results  Component Value Date   LDLCALC 71 03/04/2017  stable overall by history and exam, recent data reviewed with pt, and pt to continue medical treatment as before,  to f/u any worsening symptoms or concerns

## 2017-09-06 NOTE — Assessment & Plan Note (Signed)
stable overall by history and exam, and pt to continue medical treatment as before,  to f/u any worsening symptoms or concerns 

## 2017-09-06 NOTE — Assessment & Plan Note (Signed)
Improved, cont wt loss efforts

## 2017-09-23 ENCOUNTER — Telehealth: Payer: Self-pay | Admitting: Internal Medicine

## 2017-09-23 MED ORDER — ONDANSETRON 8 MG PO TBDP: 8.0000 mg | ORAL_TABLET | Freq: Three times a day (TID) | ORAL | 0 refills | Status: DC | PRN

## 2017-09-23 NOTE — Telephone Encounter (Signed)
Notified pt rx sent to [facility].../lmb 

## 2017-09-23 NOTE — Telephone Encounter (Signed)
Ok, this is done 

## 2017-09-23 NOTE — Telephone Encounter (Signed)
Pt called for a refill of her  ondansetron (ZOFRAN ODT) 8 MG disintegrating tablet [facility] on [address]  Please advise on refill

## 2017-09-24 ENCOUNTER — Other Ambulatory Visit: Payer: Self-pay | Admitting: Internal Medicine

## 2018-01-06 DIAGNOSIS — M25562 Pain in left knee: Secondary | ICD-10-CM | POA: Diagnosis not present

## 2018-01-06 DIAGNOSIS — M25561 Pain in right knee: Secondary | ICD-10-CM | POA: Diagnosis not present

## 2018-02-18 ENCOUNTER — Other Ambulatory Visit: Payer: Self-pay | Admitting: Internal Medicine

## 2018-03-04 ENCOUNTER — Ambulatory Visit (INDEPENDENT_AMBULATORY_CARE_PROVIDER_SITE_OTHER)
Admission: RE | Admit: 2018-03-04 | Discharge: 2018-03-04 | Disposition: A | Discharge status: Home / Self Care | Payer: Medicare Other | Source: Ambulatory Visit | Attending: Internal Medicine | Admitting: Internal Medicine

## 2018-03-04 ENCOUNTER — Other Ambulatory Visit (INDEPENDENT_AMBULATORY_CARE_PROVIDER_SITE_OTHER): Payer: Medicare Other

## 2018-03-04 ENCOUNTER — Ambulatory Visit: Payer: Medicare Other | Admitting: Internal Medicine

## 2018-03-04 ENCOUNTER — Encounter: Payer: Self-pay | Admitting: Internal Medicine

## 2018-03-04 VITALS — BP 124/68 | HR 80 | Temp 98.2°F | Ht 66.5 in | Wt 249.0 lb

## 2018-03-04 DIAGNOSIS — R0600 Dyspnea, unspecified: Secondary | ICD-10-CM | POA: Diagnosis not present

## 2018-03-04 DIAGNOSIS — E785 Hyperlipidemia, unspecified: Secondary | ICD-10-CM

## 2018-03-04 DIAGNOSIS — Z0001 Encounter for general adult medical examination with abnormal findings: Secondary | ICD-10-CM

## 2018-03-04 DIAGNOSIS — R05 Cough: Secondary | ICD-10-CM | POA: Diagnosis not present

## 2018-03-04 DIAGNOSIS — E119 Type 2 diabetes mellitus without complications: Secondary | ICD-10-CM

## 2018-03-04 DIAGNOSIS — R32 Unspecified urinary incontinence: Secondary | ICD-10-CM | POA: Diagnosis not present

## 2018-03-04 DIAGNOSIS — R0602 Shortness of breath: Secondary | ICD-10-CM | POA: Diagnosis not present

## 2018-03-04 DIAGNOSIS — E1140 Type 2 diabetes mellitus with diabetic neuropathy, unspecified: Secondary | ICD-10-CM | POA: Insufficient documentation

## 2018-03-04 LAB — LIPID PANEL
CHOLESTEROL: 144 mg/dL (ref 0–200)
HDL: 38.1 mg/dL — ABNORMAL LOW (ref >39.00)
LDL CALC: 66 mg/dL (ref 0–99)
NonHDL: 105.63
TRIGLYCERIDES: 200 mg/dL — AB (ref 0.0–149.0)
Total CHOL/HDL Ratio: 4
VLDL: 40 mg/dL (ref 0.0–40.0)

## 2018-03-04 LAB — HEPATIC FUNCTION PANEL
ALBUMIN: 4.3 g/dL (ref 3.5–5.2)
ALK PHOS: 84 U/L (ref 39–117)
ALT: 27 U/L (ref 0–35)
AST: 23 U/L (ref 0–37)
BILIRUBIN TOTAL: 0.5 mg/dL (ref 0.2–1.2)
Bilirubin, Direct: 0.1 mg/dL (ref 0.0–0.3)
Total Protein: 7.2 g/dL (ref 6.0–8.3)

## 2018-03-04 LAB — BASIC METABOLIC PANEL
BUN: 17 mg/dL (ref 6–23)
CHLORIDE: 102 meq/L (ref 96–112)
CO2: 24 meq/L (ref 19–32)
Calcium: 9.8 mg/dL (ref 8.4–10.5)
Creatinine, Ser: 0.71 mg/dL (ref 0.40–1.20)
GFR: 85.91 mL/min (ref >60.00)
Glucose, Bld: 126 mg/dL — ABNORMAL HIGH (ref 70–99)
Potassium: 4.3 mEq/L (ref 3.5–5.1)
SODIUM: 137 meq/L (ref 135–145)

## 2018-03-04 LAB — CBC WITH DIFFERENTIAL/PLATELET
BASOS ABS: 0.1 10*3/uL (ref 0.0–0.1)
Basophils Relative: 0.7 % (ref 0.0–3.0)
Eosinophils Absolute: 0.3 10*3/uL (ref 0.0–0.7)
Eosinophils Relative: 2.6 % (ref 0.0–5.0)
HCT: 43.2 % (ref 36.0–46.0)
Hemoglobin: 14.6 g/dL (ref 12.0–15.0)
Lymphocytes Relative: 39.1 % (ref 12.0–46.0)
Lymphs Abs: 4.9 10*3/uL — ABNORMAL HIGH (ref 0.7–4.0)
MCHC: 33.8 g/dL (ref 30.0–36.0)
MCV: 89.9 fl (ref 78.0–100.0)
MONO ABS: 0.9 10*3/uL (ref 0.1–1.0)
MONOS PCT: 7.3 % (ref 3.0–12.0)
NEUTROS ABS: 6.3 10*3/uL (ref 1.4–7.7)
NEUTROS PCT: 50.3 % (ref 43.0–77.0)
Platelets: 387 10*3/uL (ref 150.0–400.0)
RBC: 4.8 Mil/uL (ref 3.87–5.11)
RDW: 14.3 % (ref 11.5–15.5)
WBC: 12.5 10*3/uL — ABNORMAL HIGH (ref 4.0–10.5)

## 2018-03-04 LAB — URINALYSIS, ROUTINE W REFLEX MICROSCOPIC
Bilirubin Urine: NEGATIVE
Hgb urine dipstick: NEGATIVE
KETONES UR: NEGATIVE
Leukocytes, UA: NEGATIVE
Nitrite: NEGATIVE
PH: 5.5 (ref 5.0–8.0)
RBC / HPF: NONE SEEN (ref 0–?)
SPECIFIC GRAVITY, URINE: 1.025 (ref 1.000–1.030)
Total Protein, Urine: NEGATIVE
URINE GLUCOSE: 500 — AB
UROBILINOGEN UA: 0.2 (ref 0.0–1.0)

## 2018-03-04 LAB — MICROALBUMIN / CREATININE URINE RATIO
Creatinine,U: 93.9 mg/dL
Microalb Creat Ratio: 0.7 mg/g (ref 0.0–30.0)
Microalb, Ur: <0.7 mg/dL (ref 0.0–1.9)

## 2018-03-04 LAB — TSH: TSH: 3.6 u[IU]/mL (ref 0.35–4.50)

## 2018-03-04 LAB — HEMOGLOBIN A1C: Hgb A1c MFr Bld: 7.4 % — ABNORMAL HIGH (ref 4.6–6.5)

## 2018-03-04 IMAGING — DX DG CHEST 2V
2 series · 2 of 2 positions shown · non-contrast
Comparison: CT [DATE].  Chest x-ray [DATE].

CLINICAL DATA: Persistent cough.  Shortness of breath.

EXAM:
CHEST - 2 VIEW

[chest pa]
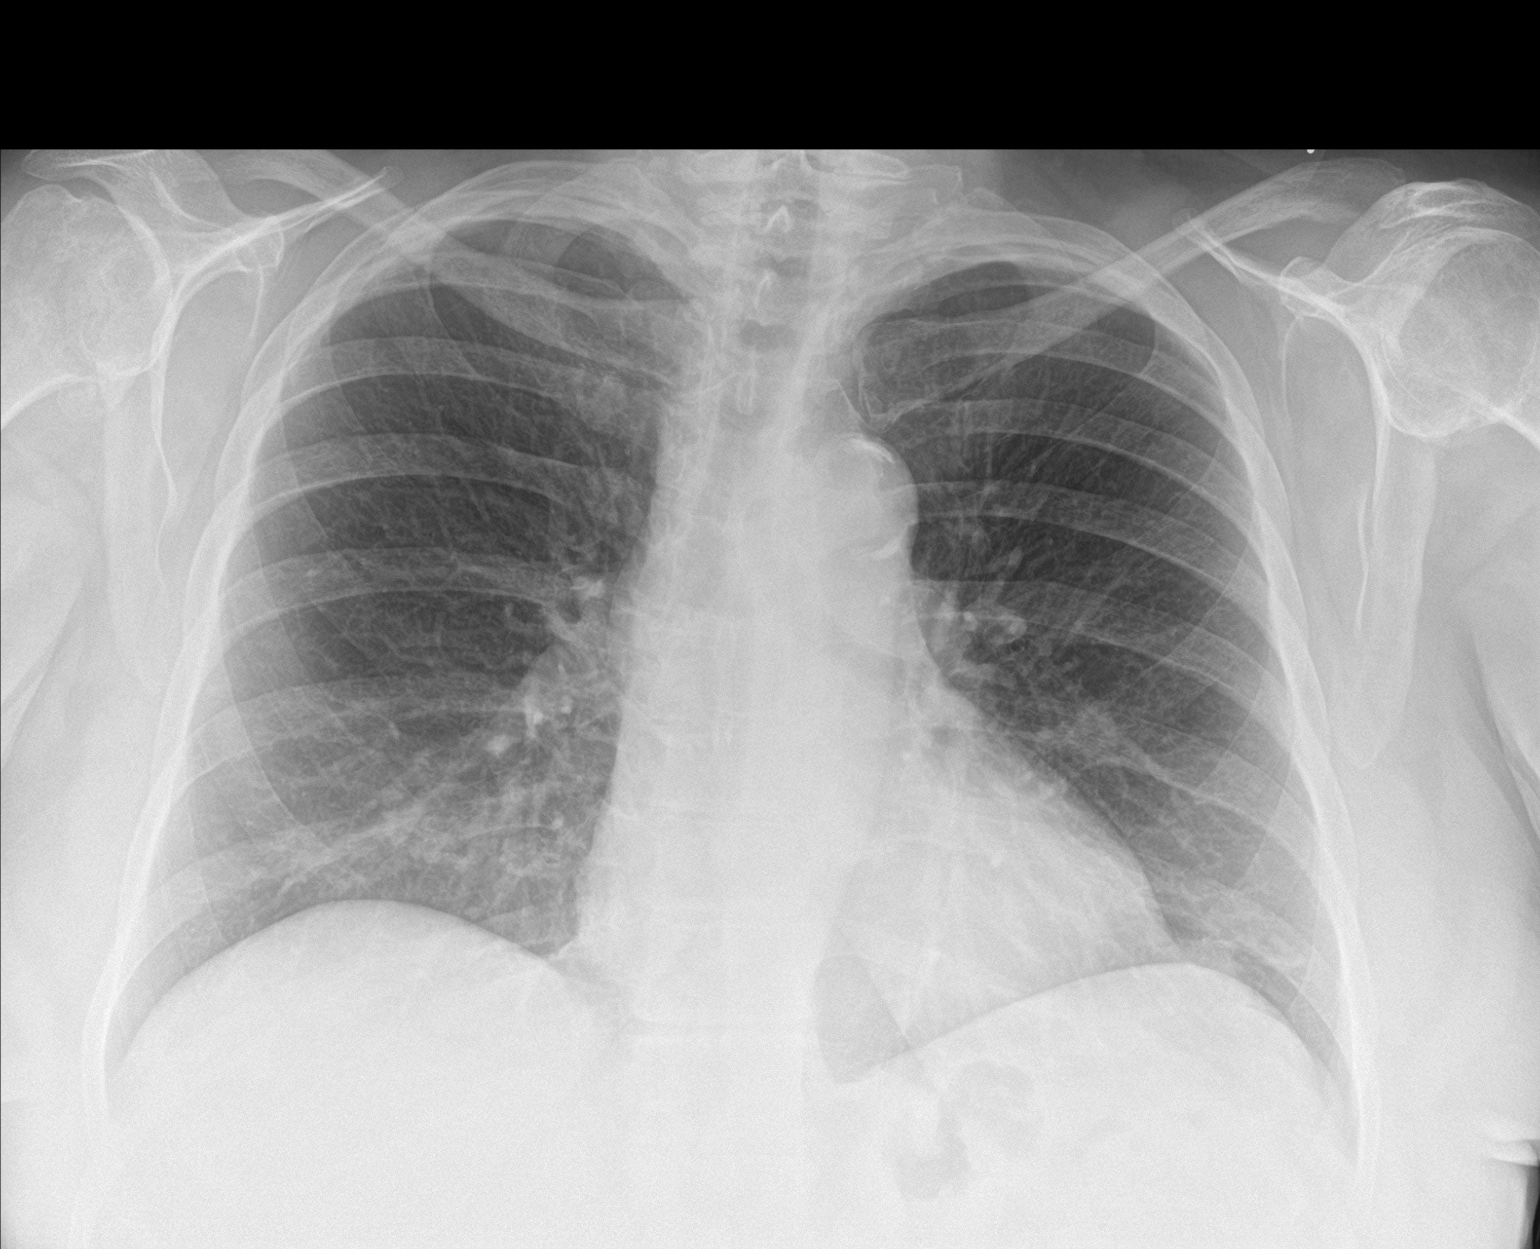

[chest lat]
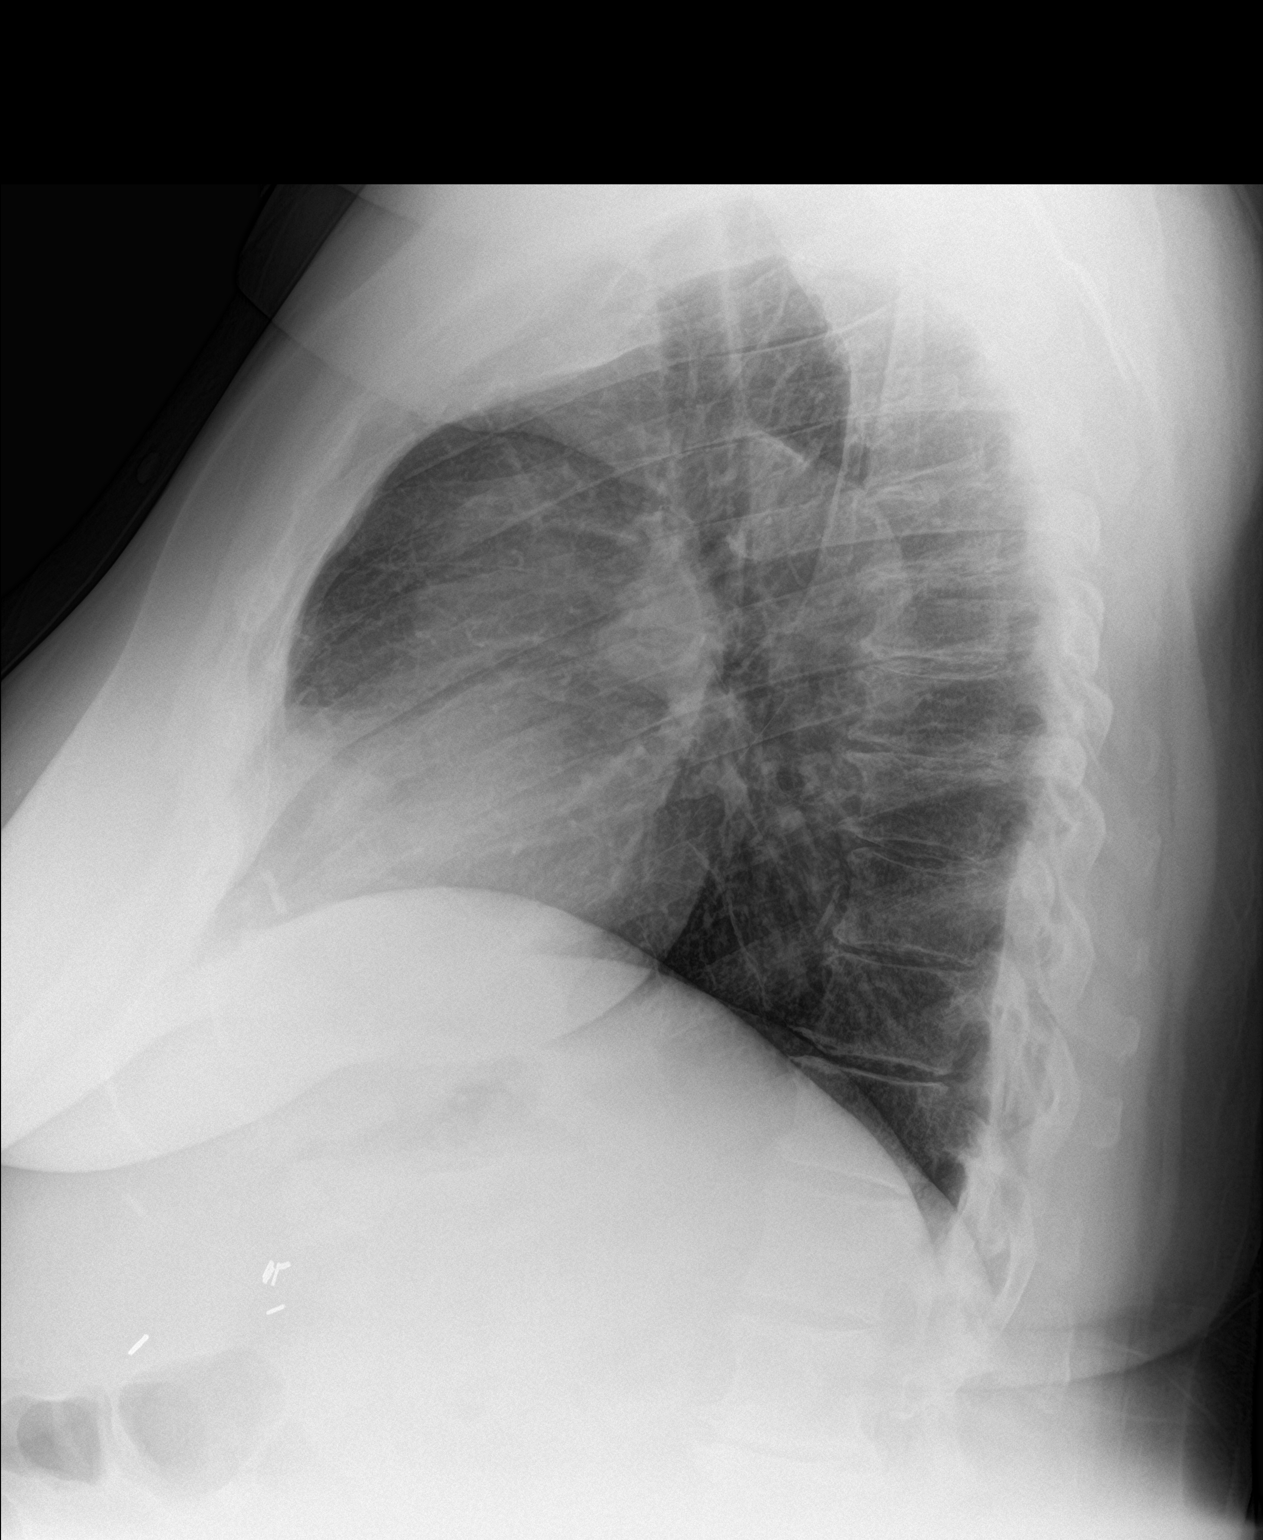

[2 of 2 positions shown; findings below may reference images not displayed]

FINDINGS: Mediastinum and hilar structures normal. Heart size stable. Low lung
volumes with mild bibasilar subsegmental atelectasis/scarring. Mild
basilar infiltrate anteriorly on lateral view may be present. No
pleural effusion or pneumothorax. Degenerative change
cervicothoracic spine. Degenerative changes both shoulders.
IMPRESSION: Low lung volumes with mild bibasilar subsegmental
atelectasis/scarring again noted. Mild basilar infiltrate anteriorly
on lateral view may be present. Followup PA and lateral chest X-ray
is recommended in 3-4 weeks following trial of antibiotic therapy to
ensure resolution and exclude underlying malignancy.

## 2018-03-04 MED ORDER — MECLIZINE HCL 25 MG PO TABS: 25.0000 mg | ORAL_TABLET | Freq: Three times a day (TID) | ORAL | 1 refills | Status: DC | PRN

## 2018-03-04 MED ORDER — SOLIFENACIN SUCCINATE 5 MG PO TABS: 5.0000 mg | ORAL_TABLET | Freq: Every day | ORAL | 3 refills | Status: DC

## 2018-03-04 MED ORDER — ONDANSETRON HCL 4 MG PO TABS: 4.0000 mg | ORAL_TABLET | Freq: Three times a day (TID) | ORAL | 1 refills | Status: DC | PRN

## 2018-03-04 NOTE — Patient Instructions (Addendum)
Your EKG was OK today  Please take all new medication as prescribed- the vesicare for bladder  Please continue all other medications as before, and refills have been done if requested.  Please have the pharmacy call with any other refills you may need.  Please continue your efforts at being more active, low cholesterol diet, and weight control.  You are otherwise up to date with prevention measures today.  Please keep your appointments with your specialists as you may have planned  Please go to the XRAY Department in the Basement (go straight as you get off the elevator) for the x-ray testing  Please go to the LAB in the Basement (turn left off the elevator) for the tests to be done today  You will be contacted by phone if any changes need to be made immediately.  Otherwise, you will receive a letter about your results with an explanation, but please check with MyChart first.  Please remember to sign up for MyChart if you have not done so, as this will be important to you in the future with finding out test results, communicating by private email, and scheduling acute appointments online when needed.  Please return in 6 months, or sooner if needed, with Lab testing done 3-5 days before

## 2018-03-04 NOTE — Progress Notes (Signed)
Subjective:    Patient ID: Alexandra Wolfe, female    DOB: 05/30/1946, 72 y.o.   MRN: [id number]  HPI  Here for wellness and f/u;  Overall doing ok.  Pt denies neurological change such as new headache, facial or extremity weakness.  Pt states overall good compliance with treatment and medications, good tolerability, and has been trying to follow appropriate diet.  Pt denies worsening depressive symptoms, suicidal ideation or panic. No fever, night sweats, wt loss, loss of appetite, or other constitutional symptoms.  Pt states good ability with ADL's, has low fall risk, home safety reviewed and adequate, no other significant changes in hearing or vision, and only occasionally active with exercise. Pt denies chest pain, wheezing, orthopnea, PND, increased LE swelling, palpitations, dizziness or syncope, but does have mild to mod possibly worsening sob/doe in the last month, without fever, cough.  No recent cxr. Gained 9 lbs despite [employer] 3 time per wk per pt with scales here: limited overall by bilat knee pain, seeing Dr Veverly Fells Manson Passey  Wt Readings from Last 3 Encounters:  03/04/18 249 lb (112.9 kg)  09/03/17 249 lb (112.9 kg)  04/03/17 243 lb 3.2 oz (110.3 kg)  Has new eyeglasees after eye exam, has cataracts , putting off surgury for now.  Pt denies polydipsia, polyuria, or low sugar symptoms such as weakness or confusion improved with po intake.  Pt states overall good compliance with meds, trying to follow lower cholesterol, diabetic diet, wt overall stable but little exercise however.  Also, Denies urinary symptoms such as dysuria, frequency, urgency, flank pain, hematuria or n/v, fever, chills, but does have recurring urinary incontenence assoc with random urges and pressure, cant get to BR fast enough Past Medical History:  Diagnosis Date  . ALLERGIC RHINITIS 04/02/2009   no per pt  . Anxiety   . BACK PAIN 09/27/2008  . BUNIONS, BILATERAL 12/23/2007  . CHEST DISCOMFORT, ATYPICAL  11/07/2009  . Chronic LBP   . COLONIC POLYPS, HX OF 09/13/2007  . CONSTIPATION 09/27/2008  . DEPRESSION 09/13/2007  . Diabetes mellitus    diet controlled  . DYSPNEA ON EXERTION 02/06/2010  . Eustachian tube dysfunction 05/20/2011  . FOOT PAIN, RIGHT 09/27/2008  . HYPERLIPIDEMIA 03/11/2008  . LBP (low back pain) 05/20/2011  . Leukocytosis 11/20/2011  . OSTEOARTHROSIS NOS, LOWER LEG 09/13/2007  . SHOULDER PAIN, LEFT 02/14/2009  . SVT (supraventricular tachycardia) ([place])   . SYMPTOM, PALPITATIONS 09/13/2007  . URINARY INCONTINENCE 08/23/2009  . Vertigo 05/20/2011   Past Surgical History:  Procedure Laterality Date  . BUNIONECTOMY     right  . ccx    . CHOLECYSTECTOMY    . LUMBAR LAMINECTOMY    . LUMBAR LAMINECTOMY    . SHOULDER ARTHROSCOPY  12/13/2011   Procedure: ARTHROSCOPY SHOULDER;  Surgeon: Augustin Schooling;  Location: [facility];  Service: Orthopedics;  Laterality: Left;  Left Shoulder ArthroscopyDebridement Limited Tenodesis Open Rotator Cuff Repair Spur Removal Right Shoulder Injection     reports that she has quit smoking. She has a 10.00 pack-year smoking history. She has never used smokeless tobacco. She reports that she does not drink alcohol or use drugs. family history includes Colon cancer in her mother; Diabetes in her other; Diabetes type II in her father; Heart disease in her brother and father. Allergies  Allergen Reactions  . Aripiprazole     REACTION: agitation  . Simvastatin     REACTION: myalgia   Current Outpatient Medications on File Prior to Visit  Medication Sig Dispense Refill  . aspirin 81 MG tablet Take 81 mg by mouth daily.      [name] atorvastatin (LIPITOR) 40 MG tablet TAKE 1 TABLET(40 MG) BY MOUTH DAILY 90 tablet 3  . budesonide-formoterol (SYMBICORT) 160-4.5 MCG/ACT inhaler Inhale 2 puffs into the lungs 2 (two) times daily. 1 Inhaler 12  . cetirizine (ZYRTEC) 10 MG tablet Take 1 tablet (10 mg total) by mouth daily. 30 tablet 11  . eszopiclone (LUNESTA) 2 MG TABS  tablet Take 1 tablet (2 mg total) by mouth at bedtime as needed for sleep. Take immediately before bedtime 30 tablet 2  . fluticasone (FLONASE) 50 MCG/ACT nasal spray Place 2 sprays into both nostrils daily. For allergies 16 g 11  . hydrochlorothiazide (HYDRODIURIL) 25 MG tablet TAKE 1/2 TABLET(12.5 MG) BY MOUTH DAILY AS NEEDED 45 tablet 3  . levothyroxine (SYNTHROID, LEVOTHROID) 50 MCG tablet TAKE 1 TABLET(50 MCG) BY MOUTH DAILY 90 tablet 1  . meloxicam (MOBIC) 7.5 MG tablet Take 1 tablet (7.5 mg total) by mouth daily as needed for pain. 30 tablet 5  . metFORMIN (GLUCOPHAGE-XR) 500 MG 24 hr tablet 4 tabs by mouth every AM 360 tablet 3  . metoprolol succinate (TOPROL-XL) 25 MG 24 hr tablet TAKE 1 TABLET(25 MG) BY MOUTH DAILY 90 tablet 3  . ondansetron (ZOFRAN ODT) 8 MG disintegrating tablet Take 1 tablet (8 mg total) by mouth every 8 (eight) hours as needed for nausea or vomiting. 40 tablet 0  . traMADol (ULTRAM) 50 MG tablet Take 1 tablet (50 mg total) by mouth every 6 (six) hours as needed. 120 tablet 0   No current facility-administered medications on file prior to visit.    Review of Systems Constitutional: Negative for other unusual diaphoresis, sweats, appetite or weight changes HENT: Negative for other worsening hearing loss, ear pain, facial swelling, mouth sores or neck stiffness.   Eyes: Negative for other worsening pain, redness or other visual disturbance.  Respiratory: Negative for other stridor or swelling Cardiovascular: Negative for other palpitations or other chest pain  Gastrointestinal: Negative for worsening diarrhea or loose stools, blood in stool, distention or other pain Genitourinary: Negative for hematuria, flank pain or other change in urine volume.  Musculoskeletal: Negative for myalgias or other joint swelling.  Skin: Negative for other color change, or other wound or worsening drainage.  Neurological: Negative for other syncope or numbness. Hematological: Negative  for other adenopathy or swelling Psychiatric/Behavioral: Negative for hallucinations, other worsening agitation, SI, self-injury, or new decreased concentration All other system neg per pt    Objective:   Physical Exam BP 124/68 (BP Location: Left Arm, Patient Position: Sitting, Cuff Size: Normal)   Pulse 80   Temp 98.2 F (36.8 C) (Oral)   Ht 5' 6.5" (1.689 m)   Wt 249 lb (112.9 kg)   SpO2 97%   BMI 39.59 kg/m  VS noted,  Constitutional: Pt is oriented to person, place, and time. Appears well-developed and well-nourished, in no significant distress and comfortable Head: Normocephalic and atraumatic  Eyes: Conjunctivae and EOM are normal. Pupils are equal, round, and reactive to light Right Ear: External ear normal without discharge Left Ear: External ear normal without discharge Nose: Nose without discharge or deformity Mouth/Throat: Oropharynx is without other ulcerations and moist  Neck: Normal range of motion. Neck supple. No JVD present. No tracheal deviation present or significant neck LA or mass Cardiovascular: Normal rate, regular rhythm, normal heart sounds and intact distal pulses.   Pulmonary/Chest: WOB normal  and breath sounds without rales or wheezing  Abdominal: Soft. Bowel sounds are normal. NT. No HSM  Musculoskeletal: Normal range of motion. Exhibits no edema Lymphadenopathy: Has no other cervical adenopathy.  Neurological: Pt is alert and oriented to person, place, and time. Pt has normal reflexes. No cranial nerve deficit. Motor grossly intact, Gait intact Skin: Skin is warm and dry. No rash noted or new ulcerations Psychiatric:  Has normal mood and affect. Behavior is normal without agitation No other exam findings  ECG today I have personally interpreted NSR - no ischemic changes Lab Results  Component Value Date   WBC 12.5 (H) 03/04/2018   HGB 14.6 03/04/2018   HCT 43.2 03/04/2018   PLT 387.0 03/04/2018   GLUCOSE 126 (H) 03/04/2018   CHOL 144  03/04/2018   TRIG 200.0 (H) 03/04/2018   HDL 38.10 (L) 03/04/2018   LDLDIRECT 74.0 09/01/2017   LDLCALC 66 03/04/2018   ALT 27 03/04/2018   AST 23 03/04/2018   NA 137 03/04/2018   K 4.3 03/04/2018   CL 102 03/04/2018   CREATININE 0.71 03/04/2018   BUN 17 03/04/2018   CO2 24 03/04/2018   TSH 3.60 03/04/2018   INR 1.0 ratio 11/13/2009   HGBA1C 7.4 (H) 03/04/2018   MICROALBUR <0.7 03/04/2018   PFT-04/08/2016-moderate reduction of diffusion capacity at 59%, normal spirometry flows, insignificant response to bronchodilator, TLC 95%     Assessment & Plan:

## 2018-03-05 ENCOUNTER — Other Ambulatory Visit: Payer: Self-pay | Admitting: Internal Medicine

## 2018-03-05 ENCOUNTER — Encounter: Payer: Self-pay | Admitting: Internal Medicine

## 2018-03-05 ENCOUNTER — Telehealth: Payer: Self-pay

## 2018-03-05 ENCOUNTER — Telehealth: Payer: Self-pay | Admitting: Internal Medicine

## 2018-03-05 DIAGNOSIS — J189 Pneumonia, unspecified organism: Secondary | ICD-10-CM

## 2018-03-05 MED ORDER — LEVOFLOXACIN 250 MG PO TABS: 250.0000 mg | ORAL_TABLET | Freq: Every day | ORAL | 0 refills | Status: AC

## 2018-03-05 NOTE — Telephone Encounter (Signed)
Copied from [place] [phone] [id number]. Topic: General - Other >> Mar 05, 2018  8:59 AM [name], CMA wrote: Reason for CRM: to discuss XRAY results, ok for [facility] to inform  >> Mar 05, 2018  9:05 AM [name] wrote: Pt calling back for xray results

## 2018-03-05 NOTE — Telephone Encounter (Signed)
-----   Message from Biagio Borg, MD sent at 03/05/2018  8:45 AM EDT ----- Left message on MyChart, pt to cont same tx except  The test results show that your current treatment is OK,except the chest xray does suggest the likelihood of an area of pneumonia.  We should treat with an antibiotic, and ask you to check another chest xray in 4 weeks   You can just come to the Xray dept again in 4 weeks (give a take a day or two) to have this done.  Please call if you become worse before that, however, such as worsening cough, fever, shortness of breath or pain.Alexandra Wolfe to please inform pt, I will do rx

## 2018-03-05 NOTE — Telephone Encounter (Signed)
Patient notified of her results and the Rx at pharmacy.

## 2018-03-05 NOTE — Telephone Encounter (Signed)
Called pt, LVM.   CRM created.  

## 2018-03-07 ENCOUNTER — Encounter: Payer: Self-pay | Admitting: Internal Medicine

## 2018-03-07 NOTE — Assessment & Plan Note (Signed)
stable overall by history and exam, recent data reviewed with pt, and pt to continue medical treatment as before,  to f/u any worsening symptoms or concerns Lab Results  Component Value Date   HGBA1C 7.4 (H) 03/04/2018

## 2018-03-07 NOTE — Assessment & Plan Note (Signed)
stable overall by history and exam, recent data reviewed with pt, and pt to continue medical treatment as before,  to f/u any worsening symptoms or concerns Lab Results  Component Value Date   LDLCALC 66 03/04/2018

## 2018-03-07 NOTE — Assessment & Plan Note (Signed)
Etiology unclear, for cxr,  to f/u any worsening symptoms or concerns 

## 2018-03-07 NOTE — Assessment & Plan Note (Signed)
Possible OAB like symptoms - for vesicare 5 qd

## 2018-03-07 NOTE — Assessment & Plan Note (Signed)

## 2018-03-30 ENCOUNTER — Other Ambulatory Visit: Payer: Self-pay | Admitting: Internal Medicine

## 2018-04-01 ENCOUNTER — Encounter: Payer: Self-pay | Admitting: Internal Medicine

## 2018-04-06 ENCOUNTER — Ambulatory Visit: Payer: Medicare Other | Admitting: Internal Medicine

## 2018-04-06 ENCOUNTER — Ambulatory Visit (INDEPENDENT_AMBULATORY_CARE_PROVIDER_SITE_OTHER)
Admission: RE | Admit: 2018-04-06 | Discharge: 2018-04-06 | Disposition: A | Discharge status: Home / Self Care | Payer: Medicare Other | Source: Ambulatory Visit | Attending: Internal Medicine | Admitting: Internal Medicine

## 2018-04-06 ENCOUNTER — Encounter: Payer: Self-pay | Admitting: Internal Medicine

## 2018-04-06 VITALS — BP 120/70 | HR 77 | Ht 66.5 in | Wt 249.0 lb

## 2018-04-06 DIAGNOSIS — R0609 Other forms of dyspnea: Secondary | ICD-10-CM

## 2018-04-06 DIAGNOSIS — R0600 Dyspnea, unspecified: Secondary | ICD-10-CM

## 2018-04-06 DIAGNOSIS — J189 Pneumonia, unspecified organism: Secondary | ICD-10-CM

## 2018-04-06 DIAGNOSIS — R05 Cough: Secondary | ICD-10-CM | POA: Diagnosis not present

## 2018-04-06 DIAGNOSIS — R0602 Shortness of breath: Secondary | ICD-10-CM | POA: Diagnosis not present

## 2018-04-06 DIAGNOSIS — G4733 Obstructive sleep apnea (adult) (pediatric): Secondary | ICD-10-CM | POA: Diagnosis not present

## 2018-04-06 IMAGING — DX DG CHEST 2V
2 series · 2 of 2 positions shown · non-contrast
Comparison: [DATE]

CLINICAL DATA: Follow-up pneumonia.  Cough, shortness of breath

EXAM:
CHEST - 2 VIEW

[chest pa]
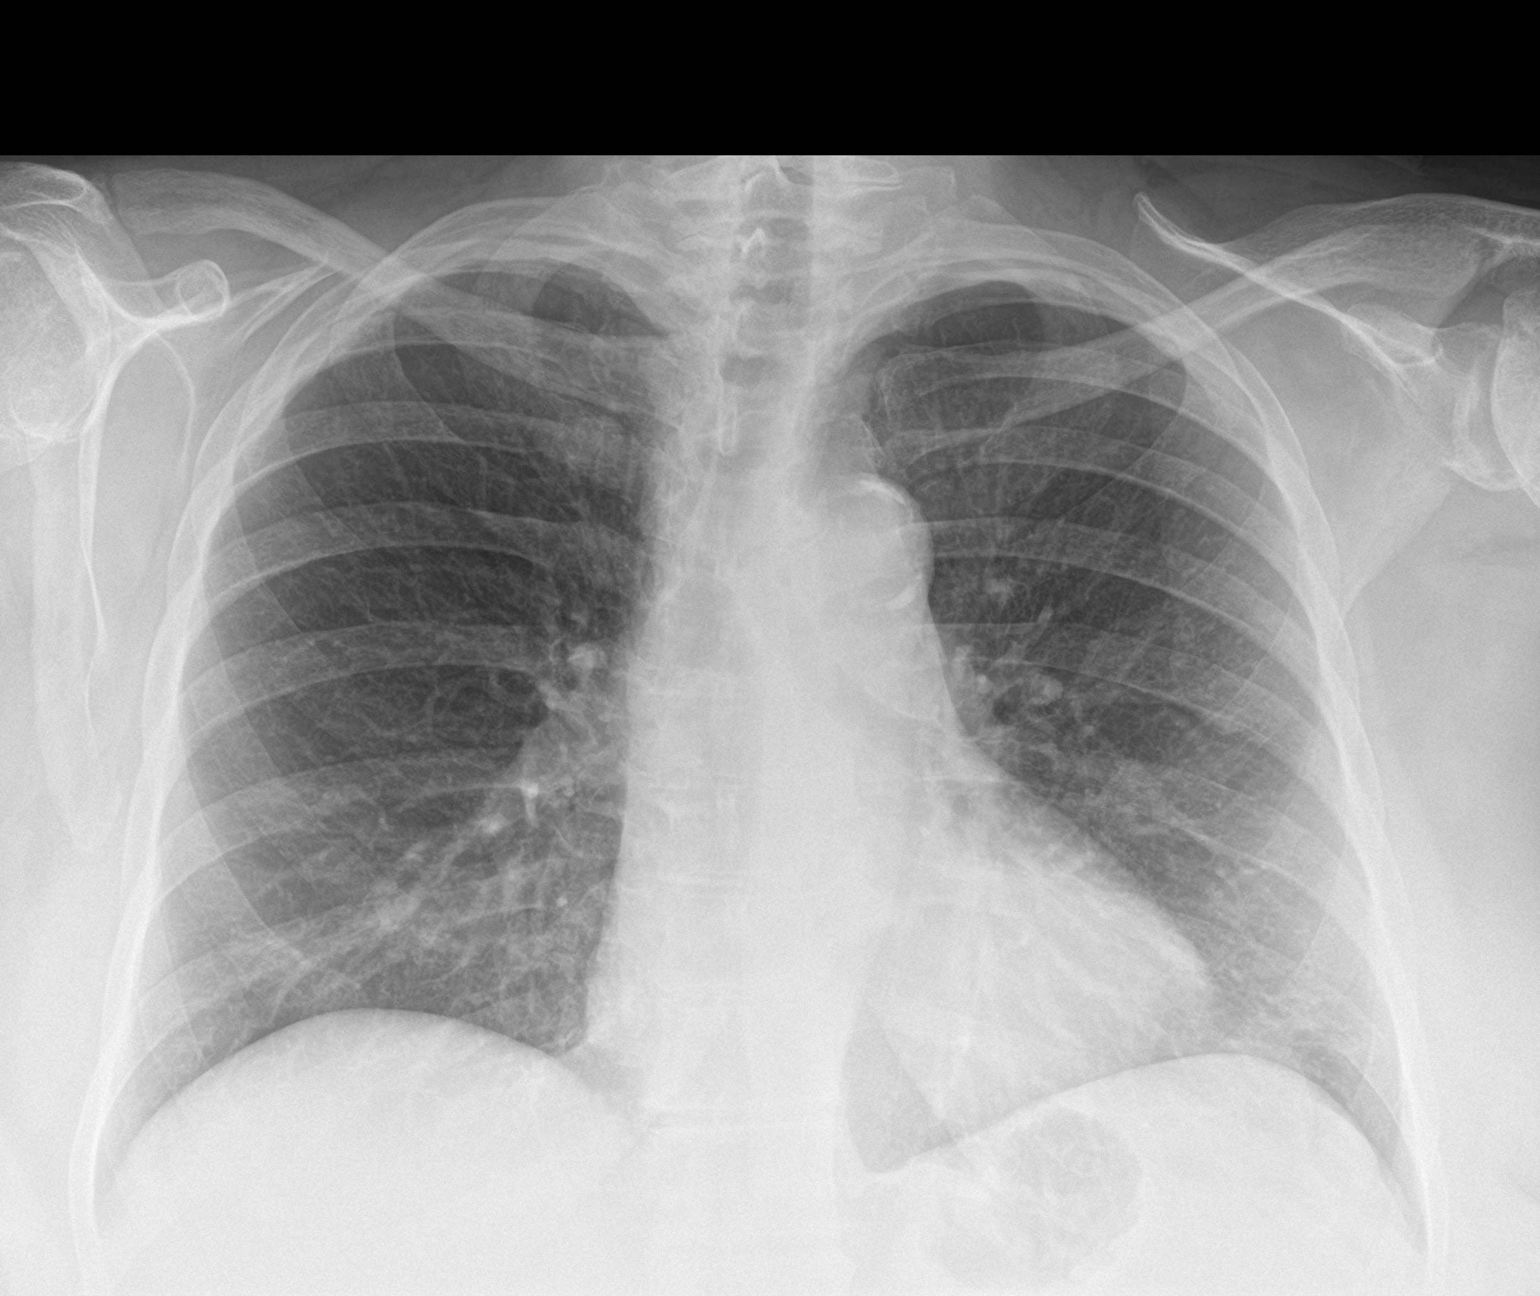

[chest lat]
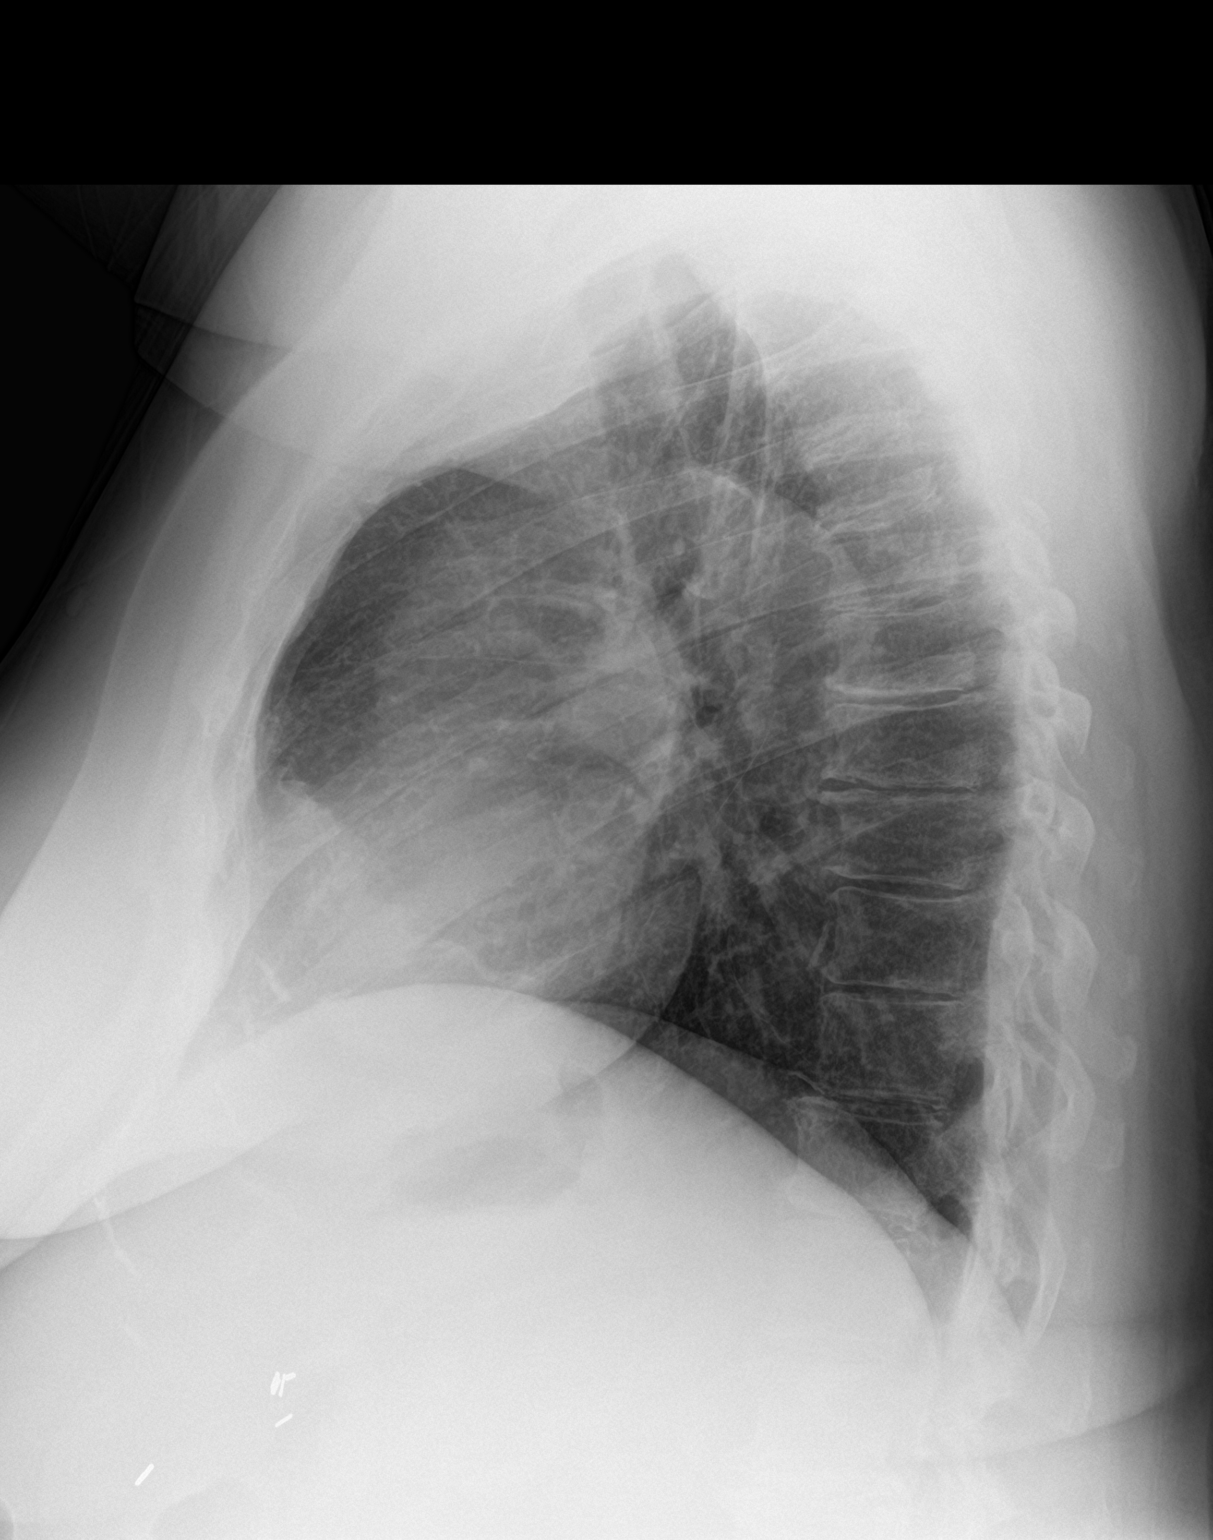

[2 of 2 positions shown; findings below may reference images not displayed]

FINDINGS: Heart is normal size. Persistent left basilar, likely lingular
opacity noted, unchanged. I favor this represents scarring. No
effusions or acute bony abnormality.
IMPRESSION: Persistent lingular opacity, favor scarring.

## 2018-04-06 NOTE — Patient Instructions (Addendum)
We can continue CPAP auto 5-20, mask of choice, humidifier, supplies, AirView  Order- schedule CPAP mask fitting with daytime sleep center tech   Dx OSA  Schedule PFT   Dx dyspnea on exertion

## 2018-04-06 NOTE — Progress Notes (Signed)
HPI female former smoker followed for OSA, complicated by allergic rhinitis, asthma, hyperthyroid, DM 2 Unattended Home Sleep Test- 05/07/2016-moderate obstructive sleep apnea-AHI 18.4/hour, desaturation to 79%, body weight 239.5 pounds  PFT-04/08/2016-moderate reduction of diffusion capacity at 59%, normal spirometry flows, insignificant response to bronchodilator, TLC 95% Leukocytosis- sustained leukocytosis with WBC climbing from 12,500 on 12/22/2015 to 15,500 on 04/04/2016  --------------------------------------------------------------------------------------------------------------  04/03/17-72 year old female former smoker followed for OSA, complicated by allergic rhinitis, asthma, hyperthyroid, DM 2 FOLLOWS FOR DME [facility] DL ATTACHED SUPPLIES NEEDED PATIENT IS NOT WEARING CPAP EVERY NIGHT Due TO THE MACHINE BEENING BOTHERSOME She is finding her fullface mask annoying. Aware that she is a mouth breather much of the time but can breathe through her nose with intention. Compliance 43% 4 hour, AHI 0.8/hour. Steroid injections for arthritis associated with some restlessness. Mild intermittent asthma. Asks refill Symbicort. Very occasional use of rescue inhaler.  04/06/2018- 72 year old female former smoker followed for OSA, complicated by allergic rhinitis, asthma, hyperthyroid, DM 2 CPAP auto 5-20/ [facility] ----87yr f/u for OSA. Per patient, she currently has PNA.  Download 47% compliance, AHI 0.3/hour-  Reflecting the fact that she wears her CPAP every other night "because I cannot stand".  She does not like having anything on her face.  The main problem may be full- face mask and she is willing to look at alternatives before giving up.  We talked again about availability of oral appliances.  Every night recorded on the download stops that exactly 4 hours. We reviewed recent CXR showing infiltrate on lateral view.  She is going this afternoon for follow-up CXR, managed by Dr. Jenny Reichmann. We talked about  her long-standing complaint of dyspnea on exertion "at times", which goes back many years with negative work-ups.  No acute event.  Has some dry cough. CXR 03/05/2018- IMPRESSION: Low lung volumes with mild bibasilar subsegmental atelectasis/scarring again noted. Mild basilar infiltrate anteriorly on lateral view may be present. Followup PA and lateral chest X-ray is recommended in 3-4 weeks following trial of antibiotic therapy to ensure resolution and exclude underlying malignancy.  ROS-see HPI   + = positive Constitutional:    weight loss, night sweats, fevers, chills,+ fatigue, lassitude. HEENT:    headaches, difficulty swallowing, tooth/dental problems, sore throat,       sneezing, itching, ear ache, nasal congestion, post nasal drip, snoring CV:    chest pain, orthopnea, PND, + swelling in lower extremities, anasarca,                                                     dizziness, palpitations Resp:   + shortness of breath with exertion or at rest.                productive cough,   non-productive cough, coughing up of blood.              change in color of mucus.  + wheezing.   Skin:    rash or lesions. GI:  No-   heartburn, indigestion, abdominal pain, nausea, vomiting, diarrhea,                 change in bowel habits, loss of appetite GU: dysuria, change in color of urine, no urgency or frequency.   flank pain. MS:   joint pain, stiffness, decreased range of motion, back pain. Neuro-  nothing unusual Psych:  change in mood or affect.  depression or anxiety.   memory loss.  OBJ- Physical Exam General- Alert, Oriented, Affect-appropriate, Distress- none acute, + obese Skin- rash-none, lesions- none, excoriation- none Lymphadenopathy- none Head- atraumatic            Eyes- Gross vision intact, PERRLA, conjunctivae and secretions clear            Ears- Hearing, canals-normal            Nose- Clear, no-Septal dev, mucus, polyps, erosion, perforation             Throat-  Mallampati III-IV , mucosa clear , drainage- none, tonsils- atrophic Neck- flexible , trachea midline, no stridor , thyroid nl, carotid no bruit Chest - symmetrical excursion , unlabored           Heart/CV- RRR , no murmur , no gallop  , no rub, nl s1 s2                           - JVD- none , edema- none, stasis changes- none, varices- none           Lung-  wheeze-none, cough- none , dullness-none, rub- none,            Chest wall-  Abd-  Br/ Gen/ Rectal- Not done, not indicated Extrem- cyanosis- none, clubbing, none, atrophy- none, strength- nl.  Neuro- grossly intact to observation

## 2018-04-07 ENCOUNTER — Encounter: Payer: Self-pay | Admitting: Internal Medicine

## 2018-04-07 NOTE — Assessment & Plan Note (Signed)
Very unusual download pattern recording CPAP use every other night for exactly 4 hours-almost looks artificial.  We discussed goals and alternatives.  She indicates that main problem is mask on her face.  She does agree to have mask fitting by her DME before giving up completely. Plan-continue CPAP auto 5-20.  Schedule mask fitting.

## 2018-04-07 NOTE — Assessment & Plan Note (Addendum)
Reduced diffusion capacity is likely significant but nonspecific.  No history of PE.  The most useful intervention she could seek would be weight loss. I cannot exclude a subtle component of emphysema. Plan-update PFT

## 2018-04-13 ENCOUNTER — Other Ambulatory Visit (HOSPITAL_BASED_OUTPATIENT_CLINIC_OR_DEPARTMENT_OTHER): Payer: Medicare Other

## 2018-04-14 DIAGNOSIS — M25562 Pain in left knee: Secondary | ICD-10-CM | POA: Diagnosis not present

## 2018-04-14 DIAGNOSIS — M25561 Pain in right knee: Secondary | ICD-10-CM | POA: Diagnosis not present

## 2018-05-18 ENCOUNTER — Other Ambulatory Visit: Payer: Self-pay | Admitting: Internal Medicine

## 2018-06-18 ENCOUNTER — Telehealth: Payer: Self-pay | Admitting: Internal Medicine

## 2018-06-18 MED ORDER — HYDROCHLOROTHIAZIDE 25 MG PO TABS: 12.5000 mg | ORAL_TABLET | Freq: Every day | ORAL | 3 refills | Status: DC

## 2018-06-18 NOTE — Telephone Encounter (Signed)
Notified pt rx has been sent to [facility],,,/[facility]

## 2018-06-18 NOTE — Telephone Encounter (Signed)
Done erx 

## 2018-06-18 NOTE — Telephone Encounter (Signed)
Medication not on med list pls advise on refill.Marland KitchenJohny Wolfe

## 2018-06-18 NOTE — Telephone Encounter (Signed)
Copied from [place] [phone]. Topic: Quick Communication - Rx Refill/Question >> Jun 18, 2018 10:58 AM Gardiner Ramus wrote: Medication:hydrochlorothiazide (HYDRODIURIL) 25 MG tablet   Has the patient contacted their pharmacy? No  [facility] - [place], [address] [phone] (Phone) [phone] (Fax)  Agent: Please be advised that RX refills may take up to 3 business days. We ask that you follow-up with your pharmacy.

## 2018-07-05 ENCOUNTER — Encounter: Payer: Self-pay | Admitting: Internal Medicine

## 2018-07-06 ENCOUNTER — Ambulatory Visit: Payer: Medicare Other | Admitting: Internal Medicine

## 2018-07-06 ENCOUNTER — Encounter: Payer: Self-pay | Admitting: Internal Medicine

## 2018-07-06 VITALS — BP 140/78 | HR 93 | Ht 65.0 in | Wt 248.0 lb

## 2018-07-06 DIAGNOSIS — G4733 Obstructive sleep apnea (adult) (pediatric): Secondary | ICD-10-CM | POA: Diagnosis not present

## 2018-07-06 DIAGNOSIS — R0609 Other forms of dyspnea: Secondary | ICD-10-CM

## 2018-07-06 DIAGNOSIS — F329 Major depressive disorder, single episode, unspecified: Secondary | ICD-10-CM

## 2018-07-06 DIAGNOSIS — R0600 Dyspnea, unspecified: Secondary | ICD-10-CM

## 2018-07-06 DIAGNOSIS — F32A Depression, unspecified: Secondary | ICD-10-CM

## 2018-07-06 LAB — PULMONARY FUNCTION TEST
DL/VA % PRED: 88 %
DL/VA: 4.33 ml/min/mmHg/L
DLCO unc % pred: 82 %
DLCO unc: 21.18 ml/min/mmHg
FEF 25-75 POST: 2.87 L/s
FEF 25-75 Pre: 2.13 L/sec
FEF2575-%Change-Post: 34 %
FEF2575-%PRED-PRE: 116 %
FEF2575-%Pred-Post: 156 %
FEV1-%CHANGE-POST: 9 %
FEV1-%Pred-Post: 111 %
FEV1-%Pred-Pre: 102 %
FEV1-PRE: 2.33 L
FEV1-Post: 2.54 L
FEV1FVC-%Change-Post: 5 %
FEV1FVC-%PRED-PRE: 104 %
FEV6-%Change-Post: 3 %
FEV6-%PRED-PRE: 102 %
FEV6-%Pred-Post: 106 %
FEV6-POST: 3.07 L
FEV6-Pre: 2.96 L
FEV6FVC-%Change-Post: 0 %
FEV6FVC-%Pred-Post: 105 %
FEV6FVC-%Pred-Pre: 104 %
FVC-%CHANGE-POST: 3 %
FVC-%PRED-PRE: 98 %
FVC-%Pred-Post: 101 %
FVC-POST: 3.07 L
FVC-PRE: 2.97 L
POST FEV6/FVC RATIO: 100 %
PRE FEV1/FVC RATIO: 78 %
PRE FEV6/FVC RATIO: 100 %
Post FEV1/FVC ratio: 83 %
RV % pred: 70 %
RV: 1.61 L
TLC % pred: 89 %
TLC: 4.65 L

## 2018-07-06 MED ORDER — TRAZODONE HCL 50 MG PO TABS: 50.0000 mg | ORAL_TABLET | Freq: Every day | ORAL | 5 refills | Status: DC

## 2018-07-06 NOTE — Assessment & Plan Note (Signed)
She does not have obvious cardiopulmonary limitations.  Dyspnea primarily reflects obesity and deconditioning.  We discussed this and I encouraged regular walking in her neighborhood to build some stamina.

## 2018-07-06 NOTE — Progress Notes (Signed)
PFT done today. 

## 2018-07-06 NOTE — Patient Instructions (Signed)
Do the best you can with your CPAP  Script sent for trazodone to help relax some at bedtime if needed  [facility] to see how your breathing feels if you stay off Symbicort. You can restart it if you feel it helps.

## 2018-07-06 NOTE — Assessment & Plan Note (Signed)
CPAP is not uncomfortable for her.  She admits she just gets anxious with the thought of putting it on.  She is not willing to face another provider to consider an oral appliance.  She does benefit when she wears it. Plan-she agrees to try trazodone for sleep which may calm her enough to help with CPAP tolerance and improve sleep quality.  Medication discussed.

## 2018-07-06 NOTE — Progress Notes (Signed)
HPI female former smoker followed for OSA, complicated by allergic rhinitis, asthma, hyperthyroid, DM 2 Unattended Home Sleep Test- 05/07/2016-moderate obstructive sleep apnea-AHI 18.4/hour, desaturation to 79%, body weight 239.5 pounds  PFT-04/08/2016-moderate reduction of diffusion capacity at 59%, normal spirometry flows, insignificant response to bronchodilator, TLC 95% Leukocytosis- sustained leukocytosis with WBC climbing from 12,500 on 12/22/2015 to 15,500 on 04/04/2016 PFT 07/06/2018-WNL-FVC 3.07/101%, FEV1 2.54/111%, ratio 1.83, TLC 89%, DLCO 82% --------------------------------------------------------------------------------------------------------------  04/06/2018- 72 year old female former smoker followed for OSA, complicated by allergic rhinitis, asthma, hyperthyroid, DM 2 CPAP auto 5-20/ [facility] ----74yr f/u for OSA. Per patient, she currently has PNA.  Download 47% compliance, AHI 0.3/hour-  Reflecting the fact that she wears her CPAP every other night "because I cannot stand".  She does not like having anything on her face.  The main problem may be full- face mask and she is willing to look at alternatives before giving up.  We talked again about availability of oral appliances.  Every night recorded on the download stops that exactly 4 hours. We reviewed recent CXR showing infiltrate on lateral view.  She is going this afternoon for follow-up CXR, managed by Dr. Jenny Reichmann. We talked about her long-standing complaint of dyspnea on exertion "at times", which goes back many years with negative work-ups.  No acute event.  Has some dry cough. CXR 03/05/2018- IMPRESSION: Low lung volumes with mild bibasilar subsegmental atelectasis/scarring again noted. Mild basilar infiltrate anteriorly on lateral view may be present. Followup PA and lateral chest X-ray is recommended in 3-4 weeks following trial of antibiotic therapy to ensure resolution and exclude underlying malignancy.  07/06/2018-  72 year old female former smoker followed for OSA, complicated by allergic rhinitis, asthma, hyperthyroid, DM 2 CPAP auto 5-20/ [facility] -----OSA and DOE: Pt had PFT today-review with patient. DME: [facility] Pt states she is unable to wear CPAP every night-hard to adjust to. DL attached. Download 43% compliance AHI 0.8/hour.  She makes her self use CPAP every other night.  She describes markedly fragmented sleep, up and down all night.  Significant anxiety.  Not interested in referral for an oral appliance because it makes her anxious to go to unfamiliar offices and she spends most of her time at home except for a couple of familiar errands.  Took herself off of anxiolytics in the past. Dreads" unfamiliar places.  Symbicort 160,  Flonase She can tell that Symbicort does anything.  Not using a rescue inhaler. PFT 07/06/2018-WNL-FVC 3.07/101%, FEV1 2.54/111%, ratio 1.83, TLC 89%, DLCO 82%  ROS-see HPI   + = positive Constitutional:    weight loss, night sweats, fevers, chills,+ fatigue, lassitude. HEENT:    headaches, difficulty swallowing, tooth/dental problems, sore throat,       sneezing, itching, ear ache, nasal congestion, post nasal drip, snoring CV:    chest pain, orthopnea, PND, + swelling in lower extremities, anasarca,                                                     dizziness, palpitations Resp:   + shortness of breath with exertion or at rest.                productive cough,   non-productive cough, coughing up of blood.              change in color of mucus.  +  wheezing.   Skin:    rash or lesions. GI:  No-   heartburn, indigestion, abdominal pain, nausea, vomiting, diarrhea,                 change in bowel habits, loss of appetite GU: dysuria, change in color of urine, no urgency or frequency.   flank pain. MS:   joint pain, stiffness, decreased range of motion, back pain. Neuro-     nothing unusual Psych:  change in mood or affect.  depression or anxiety.   memory loss.  OBJ-  Physical Exam General- Alert, Oriented, Affect-appropriate, Distress- none acute, + obese Skin- rash-none, lesions- none, excoriation- none Lymphadenopathy- none Head- atraumatic            Eyes- Gross vision intact, PERRLA, conjunctivae and secretions clear            Ears- Hearing, canals-normal            Nose- Clear, no-Septal dev, mucus, polyps, erosion, perforation             Throat- Mallampati III-IV , mucosa clear , drainage- none, tonsils- atrophic Neck- flexible , trachea midline, no stridor , thyroid nl, carotid no bruit Chest - symmetrical excursion , unlabored           Heart/CV- RRR , no murmur , no gallop  , no rub, nl s1 s2                           - JVD- none , edema- none, stasis changes- none, varices- none           Lung-  wheeze-none, cough- none , dullness-none, rub- none,            Chest wall-  Abd-  Br/ Gen/ Rectal- Not done, not indicated Extrem- cyanosis- none, clubbing, none, atrophy- none, strength- nl.  Neuro- grossly intact to observation

## 2018-07-06 NOTE — Assessment & Plan Note (Signed)
Anxiety and depression.  She was treated for this in the past and took herself off the medications.  She does except trazodone to help with sleep but I have asked her to discuss this with her primary physician.

## 2018-07-24 ENCOUNTER — Other Ambulatory Visit: Payer: Self-pay | Admitting: Internal Medicine

## 2018-07-29 ENCOUNTER — Other Ambulatory Visit: Payer: Self-pay | Admitting: Internal Medicine

## 2018-07-29 NOTE — Telephone Encounter (Signed)
[facility] called and spoke to [name], [facility] about the prescription Vesicare that was sent on 07/24/18, he says the insurance is requiring a prior authorization for this medication now, since it can be ordered brand name and generic.

## 2018-07-29 NOTE — Telephone Encounter (Signed)
Copied from [place] [id number]. Topic: Quick Communication - Rx Refill/Question >> Jul 29, 2018  4:37 PM Sheran Luz wrote: Medication: Solifenacin Succinate 5 MG    Has the patient contacted their pharmacy? Yes, pharmacy states they have not received the request  Preferred Pharmacy (with phone number or street name): [id number] - [place], [place] [phone] (Phone) [phone] (Fax)

## 2018-08-03 NOTE — Telephone Encounter (Signed)
Patient called to check the status of this medication.  It appears that PA is still needed on this medication.  Please advise with the Pharmacy on [facility] 336 [phone]

## 2018-08-06 NOTE — Telephone Encounter (Signed)
Patient is calling to check on the status on her Vesicare.  That was sent on 07/24/18.  She wants to know if there is something that she needs to do on her part. Please advise.

## 2018-08-07 ENCOUNTER — Telehealth: Payer: Self-pay | Admitting: Internal Medicine

## 2018-08-07 DIAGNOSIS — R350 Frequency of micturition: Secondary | ICD-10-CM

## 2018-08-07 DIAGNOSIS — H5213 Myopia, bilateral: Secondary | ICD-10-CM | POA: Diagnosis not present

## 2018-08-07 LAB — HM DIABETES EYE EXAM

## 2018-08-07 MED ORDER — OXYBUTYNIN CHLORIDE ER 10 MG PO TB24: 10.0000 mg | ORAL_TABLET | Freq: Every day | ORAL | 3 refills | Status: DC

## 2018-08-07 NOTE — Telephone Encounter (Signed)
Patient called and states that the provider prescribed her the brand name . She states it doesn't matter which she gets as long as she gets it .Please advise

## 2018-08-07 NOTE — Telephone Encounter (Signed)
Done erx 

## 2018-08-07 NOTE — Telephone Encounter (Signed)
Called pt, LVM  Need to know why she needs brand over generic so I can properly fill out PA.

## 2018-08-07 NOTE — Telephone Encounter (Signed)
I dont think a generic is available for the vesicare; do you think she would be willing for something generic like oxybutinin?

## 2018-08-07 NOTE — Telephone Encounter (Signed)
That would be fine 

## 2018-08-11 DIAGNOSIS — M25562 Pain in left knee: Secondary | ICD-10-CM | POA: Diagnosis not present

## 2018-08-11 DIAGNOSIS — M25561 Pain in right knee: Secondary | ICD-10-CM | POA: Diagnosis not present

## 2018-08-12 NOTE — Addendum Note (Signed)
Addended by: Biagio Borg on: 08/12/2018 02:35 PM   Modules accepted: Orders

## 2018-08-12 NOTE — Telephone Encounter (Signed)
Pt called in and stated the insurance company wouldn't cover med she wants to know if she can be sent to kidney/bladder specialist.

## 2018-08-12 NOTE — Telephone Encounter (Signed)
Ok, this is done 

## 2018-08-15 ENCOUNTER — Other Ambulatory Visit: Payer: Self-pay | Admitting: Internal Medicine

## 2018-09-04 ENCOUNTER — Encounter: Payer: Self-pay | Admitting: Internal Medicine

## 2018-09-04 ENCOUNTER — Other Ambulatory Visit (INDEPENDENT_AMBULATORY_CARE_PROVIDER_SITE_OTHER): Payer: Medicare Other

## 2018-09-04 ENCOUNTER — Ambulatory Visit (INDEPENDENT_AMBULATORY_CARE_PROVIDER_SITE_OTHER): Payer: Medicare Other | Admitting: Internal Medicine

## 2018-09-04 VITALS — BP 130/68 | HR 97 | Temp 98.6°F | Resp 22 | Ht 65.0 in | Wt 246.2 lb

## 2018-09-04 DIAGNOSIS — E119 Type 2 diabetes mellitus without complications: Secondary | ICD-10-CM

## 2018-09-04 DIAGNOSIS — R42 Dizziness and giddiness: Secondary | ICD-10-CM | POA: Diagnosis not present

## 2018-09-04 DIAGNOSIS — E785 Hyperlipidemia, unspecified: Secondary | ICD-10-CM

## 2018-09-04 DIAGNOSIS — Z23 Encounter for immunization: Secondary | ICD-10-CM | POA: Diagnosis not present

## 2018-09-04 LAB — BASIC METABOLIC PANEL
BUN: 15 mg/dL (ref 6–23)
CHLORIDE: 101 meq/L (ref 96–112)
CO2: 24 mEq/L (ref 19–32)
Calcium: 10.2 mg/dL (ref 8.4–10.5)
Creatinine, Ser: 0.82 mg/dL (ref 0.40–1.20)
GFR: 72.65 mL/min (ref >60.00)
Glucose, Bld: 161 mg/dL — ABNORMAL HIGH (ref 70–99)
POTASSIUM: 4.3 meq/L (ref 3.5–5.1)
SODIUM: 138 meq/L (ref 135–145)

## 2018-09-04 LAB — LIPID PANEL
CHOL/HDL RATIO: 4
Cholesterol: 151 mg/dL (ref 0–200)
HDL: 40.6 mg/dL (ref >39.00)
NonHDL: 110.32
Triglycerides: 255 mg/dL — ABNORMAL HIGH (ref 0.0–149.0)
VLDL: 51 mg/dL — AB (ref 0.0–40.0)

## 2018-09-04 LAB — HEPATIC FUNCTION PANEL
ALBUMIN: 4.5 g/dL (ref 3.5–5.2)
ALT: 22 U/L (ref 0–35)
AST: 24 U/L (ref 0–37)
Alkaline Phosphatase: 71 U/L (ref 39–117)
BILIRUBIN DIRECT: 0.1 mg/dL (ref 0.0–0.3)
Total Bilirubin: 0.5 mg/dL (ref 0.2–1.2)
Total Protein: 7.2 g/dL (ref 6.0–8.3)

## 2018-09-04 LAB — HEMOGLOBIN A1C: Hgb A1c MFr Bld: 8.5 % — ABNORMAL HIGH (ref 4.6–6.5)

## 2018-09-04 LAB — LDL CHOLESTEROL, DIRECT: LDL DIRECT: 85 mg/dL

## 2018-09-04 NOTE — Progress Notes (Signed)
Subjective:    Patient ID: Alexandra Wolfe, female    DOB: 1946-04-15, 72 y.o.   MRN: [id number]  HPI  Here to f/u; overall doing ok,  Pt denies chest pain, increasing sob or doe, wheezing, orthopnea, PND, increased LE swelling, palpitations, dizziness or syncope.  Pt denies new neurological symptoms such as new headache, or facial or extremity weakness or numbness.  Pt denies polydipsia, polyuria, or low sugar episode.  Pt states overall good compliance with meds, mostly trying to follow appropriate diet, with wt overall stable,  but little exercise however.  Lost several lbs with better diet per pt Wt Readings from Last 3 Encounters:  09/04/18 246 lb 3.2 oz (111.7 kg)  07/06/18 248 lb (112.5 kg)  04/06/18 249 lb (112.9 kg)  Had episode vertigo last Friday but better after 2 days./  Due for cataract surgury next wed.  Admits to almost every night CPAP use.   Past Medical History:  Diagnosis Date  . ALLERGIC RHINITIS 04/02/2009   no per pt  . Anxiety   . BACK PAIN 09/27/2008  . BUNIONS, BILATERAL 12/23/2007  . CHEST DISCOMFORT, ATYPICAL 11/07/2009  . Chronic LBP   . COLONIC POLYPS, HX OF 09/13/2007  . CONSTIPATION 09/27/2008  . DEPRESSION 09/13/2007  . Diabetes mellitus    diet controlled  . DYSPNEA ON EXERTION 02/06/2010  . Eustachian tube dysfunction 05/20/2011  . FOOT PAIN, RIGHT 09/27/2008  . HYPERLIPIDEMIA 03/11/2008  . LBP (low back pain) 05/20/2011  . Leukocytosis 11/20/2011  . OSTEOARTHROSIS NOS, LOWER LEG 09/13/2007  . SHOULDER PAIN, LEFT 02/14/2009  . SVT (supraventricular tachycardia) ([place])   . SYMPTOM, PALPITATIONS 09/13/2007  . URINARY INCONTINENCE 08/23/2009  . Vertigo 05/20/2011   Past Surgical History:  Procedure Laterality Date  . BUNIONECTOMY     right  . ccx    . CHOLECYSTECTOMY    . LUMBAR LAMINECTOMY    . LUMBAR LAMINECTOMY    . SHOULDER ARTHROSCOPY  12/13/2011   Procedure: ARTHROSCOPY SHOULDER;  Surgeon: Augustin Schooling;  Location: [facility];  Service: Orthopedics;   Laterality: Left;  Left Shoulder ArthroscopyDebridement Limited Tenodesis Open Rotator Cuff Repair Spur Removal Right Shoulder Injection     reports that she has quit smoking. She has a 10.00 pack-year smoking history. She has never used smokeless tobacco. She reports that she does not drink alcohol or use drugs. family history includes Colon cancer in her mother; Diabetes in her other; Diabetes type II in her father; Heart disease in her brother and father. Allergies  Allergen Reactions  . Aripiprazole     REACTION: agitation  . Simvastatin     REACTION: myalgia   Current Outpatient Medications on File Prior to Visit  Medication Sig Dispense Refill  . aspirin 81 MG tablet Take 81 mg by mouth daily.      [name] atorvastatin (LIPITOR) 40 MG tablet TAKE 1 TABLET(40 MG) BY MOUTH DAILY 90 tablet 3  . atorvastatin (LIPITOR) 40 MG tablet TAKE 1 TABLET(40 MG) BY MOUTH DAILY 90 tablet 0  . budesonide-formoterol (SYMBICORT) 160-4.5 MCG/ACT inhaler Inhale 2 puffs into the lungs 2 (two) times daily. 1 Inhaler 12  . diclofenac (VOLTAREN) 75 MG EC tablet Take 75 mg by mouth 2 (two) times daily.    . fluticasone (FLONASE) 50 MCG/ACT nasal spray SHAKE LIQUID AND USE 2 SPRAYS IN EACH NOSTRIL DAILY FOR ALLERGIES 16 g 5  . hydrochlorothiazide (HYDRODIURIL) 25 MG tablet Take 0.5 tablets (12.5 mg total) by mouth daily. [place]  tablet 3  . levothyroxine (SYNTHROID, LEVOTHROID) 50 MCG tablet TAKE 1 TABLET(50 MCG) BY MOUTH DAILY 90 tablet 0  . meclizine (ANTIVERT) 25 MG tablet Take 1 tablet (25 mg total) by mouth 3 (three) times daily as needed for dizziness. 50 tablet 1  . metFORMIN (GLUCOPHAGE-XR) 500 MG 24 hr tablet TAKE 4 TABLETS BY MOUTH EVERY MORNING 360 tablet 3  . metoprolol succinate (TOPROL-XL) 25 MG 24 hr tablet TAKE 1 TABLET(25 MG) BY MOUTH DAILY 90 tablet 3  . ondansetron (ZOFRAN ODT) 8 MG disintegrating tablet Take 1 tablet (8 mg total) by mouth every 8 (eight) hours as needed for nausea or vomiting. 40 tablet  0  . oxybutynin (DITROPAN XL) 10 MG 24 hr tablet Take 1 tablet (10 mg total) by mouth at bedtime. 90 tablet 3  . traZODone (DESYREL) 50 MG tablet Take 1 tablet (50 mg total) by mouth at bedtime. 30 tablet 5   No current facility-administered medications on file prior to visit.    Review of Systems  Constitutional: Negative for other unusual diaphoresis or sweats HENT: Negative for ear discharge or swelling Eyes: Negative for other worsening visual disturbances Respiratory: Negative for stridor or other swelling  Gastrointestinal: Negative for worsening distension or other blood Genitourinary: Negative for retention or other urinary change Musculoskeletal: Negative for other MSK pain or swelling Skin: Negative for color change or other new lesions Neurological: Negative for worsening tremors and other numbness  Psychiatric/Behavioral: Negative for worsening agitation or other fatigue All other system neg pr pt    Objective:   Physical Exam BP 130/68   Pulse 97   Temp 98.6 F (37 C) (Oral)   Resp (!) 22   Ht 5\' 5"  (1.651 m)   Wt 246 lb 3.2 oz (111.7 kg)   SpO2 96%   BMI 40.97 kg/m  VS noted,  Constitutional: Pt appears in NAD HENT: Head: NCAT.  Right Ear: External ear normal.  Left Ear: External ear normal.  Eyes: . Pupils are equal, round, and reactive to light. Conjunctivae and EOM are normal Nose: without d/c or deformity Neck: Neck supple. Gross normal ROM Cardiovascular: Normal rate and regular rhythm.   Pulmonary/Chest: Effort normal and breath sounds without rales or wheezing.  Abd:  Soft, NT, ND, + BS, no organomegaly Neurological: Pt is alert. At baseline orientation, motor grossly intact Skin: Skin is warm. No rashes, other new lesions, no LE edema Psychiatric: Pt behavior is normal without agitation  All other exam findings neg Lab Results  Component Value Date   WBC 12.5 (H) 03/04/2018   HGB 14.6 03/04/2018   HCT 43.2 03/04/2018   PLT 387.0 03/04/2018    GLUCOSE 126 (H) 03/04/2018   CHOL 144 03/04/2018   TRIG 200.0 (H) 03/04/2018   HDL 38.10 (L) 03/04/2018   LDLDIRECT 74.0 09/01/2017   LDLCALC 66 03/04/2018   ALT 27 03/04/2018   AST 23 03/04/2018   NA 137 03/04/2018   K 4.3 03/04/2018   CL 102 03/04/2018   CREATININE 0.71 03/04/2018   BUN 17 03/04/2018   CO2 24 03/04/2018   TSH 3.60 03/04/2018   INR 1.0 ratio 11/13/2009   HGBA1C 7.4 (H) 03/04/2018   MICROALBUR <0.7 03/04/2018          Assessment & Plan:

## 2018-09-04 NOTE — Patient Instructions (Addendum)

## 2018-09-04 NOTE — Assessment & Plan Note (Signed)
stable overall by history and exam, recent data reviewed with pt, and pt to continue medical treatment as before,  to f/u any worsening symptoms or concerns. For .labs today

## 2018-09-04 NOTE — Assessment & Plan Note (Signed)
Intermittent now resolved, cont to follow

## 2018-09-04 NOTE — Assessment & Plan Note (Signed)
stable overall by history and exam, recent data reviewed with pt, and pt to continue medical treatment as before,  to f/u any worsening symptoms or concerns  

## 2018-09-04 NOTE — Addendum Note (Signed)
Addended by: Delice Bison E on: 09/04/2018 02:01 PM   Modules accepted: Orders

## 2018-09-07 ENCOUNTER — Encounter: Payer: Self-pay | Admitting: Internal Medicine

## 2018-09-07 ENCOUNTER — Other Ambulatory Visit: Payer: Self-pay | Admitting: Internal Medicine

## 2018-09-07 ENCOUNTER — Telehealth: Payer: Self-pay

## 2018-09-07 MED ORDER — EMPAGLIFLOZIN 25 MG PO TABS: 25.0000 mg | ORAL_TABLET | Freq: Every day | ORAL | 3 refills | Status: DC

## 2018-09-07 NOTE — Telephone Encounter (Signed)
-----   Message from Biagio Borg, MD sent at 09/07/2018  8:09 AM EDT ----- Left message on MyChart, pt to cont same tx except  The test results show that your current treatment is O, except the A1c is quite a bit higher.  Since the goal is to be less than 7.0, We should add a second medication called Jardiance 25 mg per day for better control.  I will send the prescription, and you should hear from the office about this as well.  Please have the pharmacy call with the name of a similar medication to Jardiance if this is not covered well by your insurance.     Sh[place] to please inform pt, I will do rx

## 2018-09-07 NOTE — Telephone Encounter (Signed)
Called pt, LVM.   CRM created.  

## 2018-09-09 DIAGNOSIS — H2512 Age-related nuclear cataract, left eye: Secondary | ICD-10-CM | POA: Diagnosis not present

## 2018-09-09 DIAGNOSIS — H25812 Combined forms of age-related cataract, left eye: Secondary | ICD-10-CM | POA: Diagnosis not present

## 2018-09-30 DIAGNOSIS — G4733 Obstructive sleep apnea (adult) (pediatric): Secondary | ICD-10-CM | POA: Diagnosis not present

## 2018-11-13 DIAGNOSIS — R350 Frequency of micturition: Secondary | ICD-10-CM | POA: Diagnosis not present

## 2018-11-13 DIAGNOSIS — R351 Nocturia: Secondary | ICD-10-CM | POA: Diagnosis not present

## 2018-11-13 DIAGNOSIS — N3946 Mixed incontinence: Secondary | ICD-10-CM | POA: Diagnosis not present

## 2018-11-13 DIAGNOSIS — N3944 Nocturnal enuresis: Secondary | ICD-10-CM | POA: Diagnosis not present

## 2018-11-20 ENCOUNTER — Other Ambulatory Visit: Payer: Self-pay | Admitting: Internal Medicine

## 2018-11-27 DIAGNOSIS — H25011 Cortical age-related cataract, right eye: Secondary | ICD-10-CM | POA: Diagnosis not present

## 2018-12-16 DIAGNOSIS — R350 Frequency of micturition: Secondary | ICD-10-CM | POA: Diagnosis not present

## 2018-12-16 DIAGNOSIS — N3946 Mixed incontinence: Secondary | ICD-10-CM | POA: Diagnosis not present

## 2018-12-21 ENCOUNTER — Other Ambulatory Visit: Payer: Self-pay | Admitting: Internal Medicine

## 2018-12-21 MED ORDER — TRAZODONE HCL 50 MG PO TABS: ORAL_TABLET | ORAL | 5 refills | Status: DC

## 2018-12-21 NOTE — Telephone Encounter (Signed)
Ok to refill total 6 months 

## 2018-12-21 NOTE — Telephone Encounter (Signed)
CY Please advise on refill. Thanks.  

## 2018-12-21 NOTE — Telephone Encounter (Signed)
CY Please sign Rx to send Electronically Thanks

## 2018-12-22 DIAGNOSIS — N3946 Mixed incontinence: Secondary | ICD-10-CM | POA: Diagnosis not present

## 2018-12-22 DIAGNOSIS — R351 Nocturia: Secondary | ICD-10-CM | POA: Diagnosis not present

## 2019-01-05 DIAGNOSIS — M25562 Pain in left knee: Secondary | ICD-10-CM | POA: Insufficient documentation

## 2019-01-05 DIAGNOSIS — M25561 Pain in right knee: Secondary | ICD-10-CM | POA: Insufficient documentation

## 2019-01-06 DIAGNOSIS — H2511 Age-related nuclear cataract, right eye: Secondary | ICD-10-CM | POA: Diagnosis not present

## 2019-01-06 DIAGNOSIS — H25011 Cortical age-related cataract, right eye: Secondary | ICD-10-CM | POA: Diagnosis not present

## 2019-01-06 DIAGNOSIS — H25811 Combined forms of age-related cataract, right eye: Secondary | ICD-10-CM | POA: Diagnosis not present

## 2019-01-07 ENCOUNTER — Ambulatory Visit: Payer: Medicare Other | Admitting: Internal Medicine

## 2019-01-14 DIAGNOSIS — M25562 Pain in left knee: Secondary | ICD-10-CM | POA: Diagnosis not present

## 2019-01-14 DIAGNOSIS — M25561 Pain in right knee: Secondary | ICD-10-CM | POA: Diagnosis not present

## 2019-01-28 DIAGNOSIS — R351 Nocturia: Secondary | ICD-10-CM | POA: Diagnosis not present

## 2019-01-28 DIAGNOSIS — N3946 Mixed incontinence: Secondary | ICD-10-CM | POA: Diagnosis not present

## 2019-02-19 IMAGING — CR DG KNEE 1-2V*L*
2 series · 2 of 2 positions shown · non-contrast
Comparison: None.

CLINICAL DATA: Fall

EXAM:
LEFT KNEE - 1-2 VIEW

[knee ap]
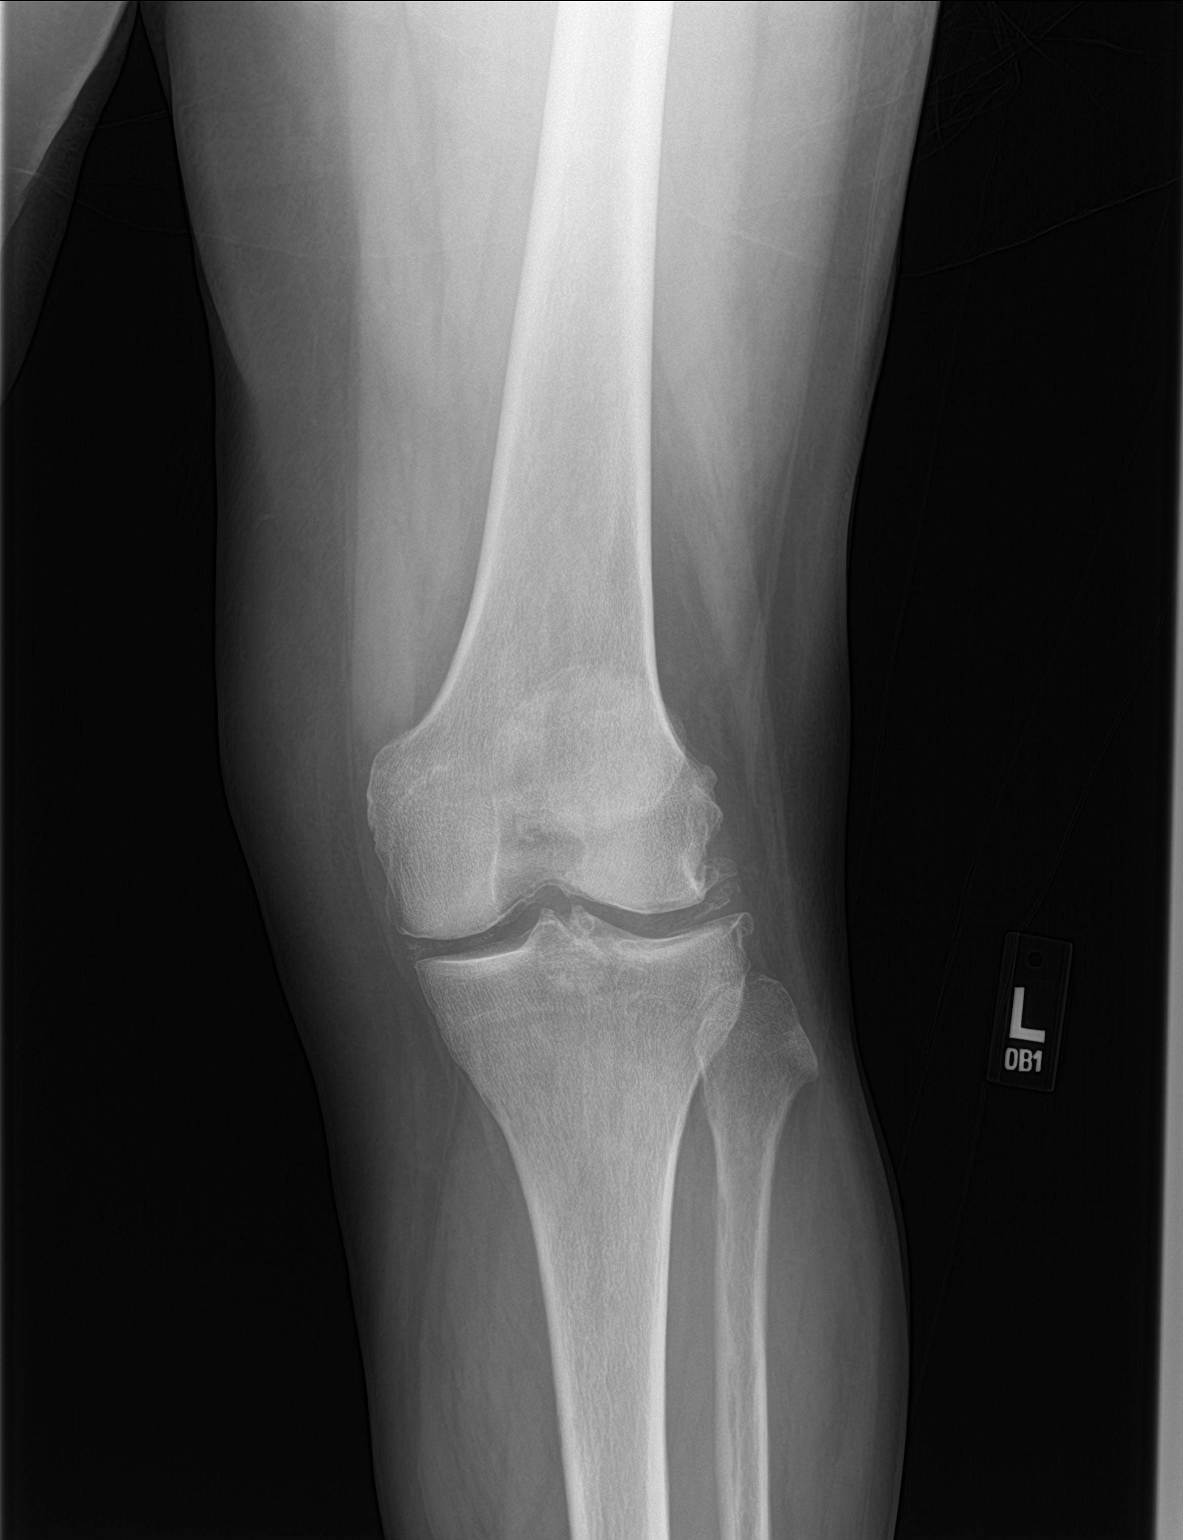

[knee lat]
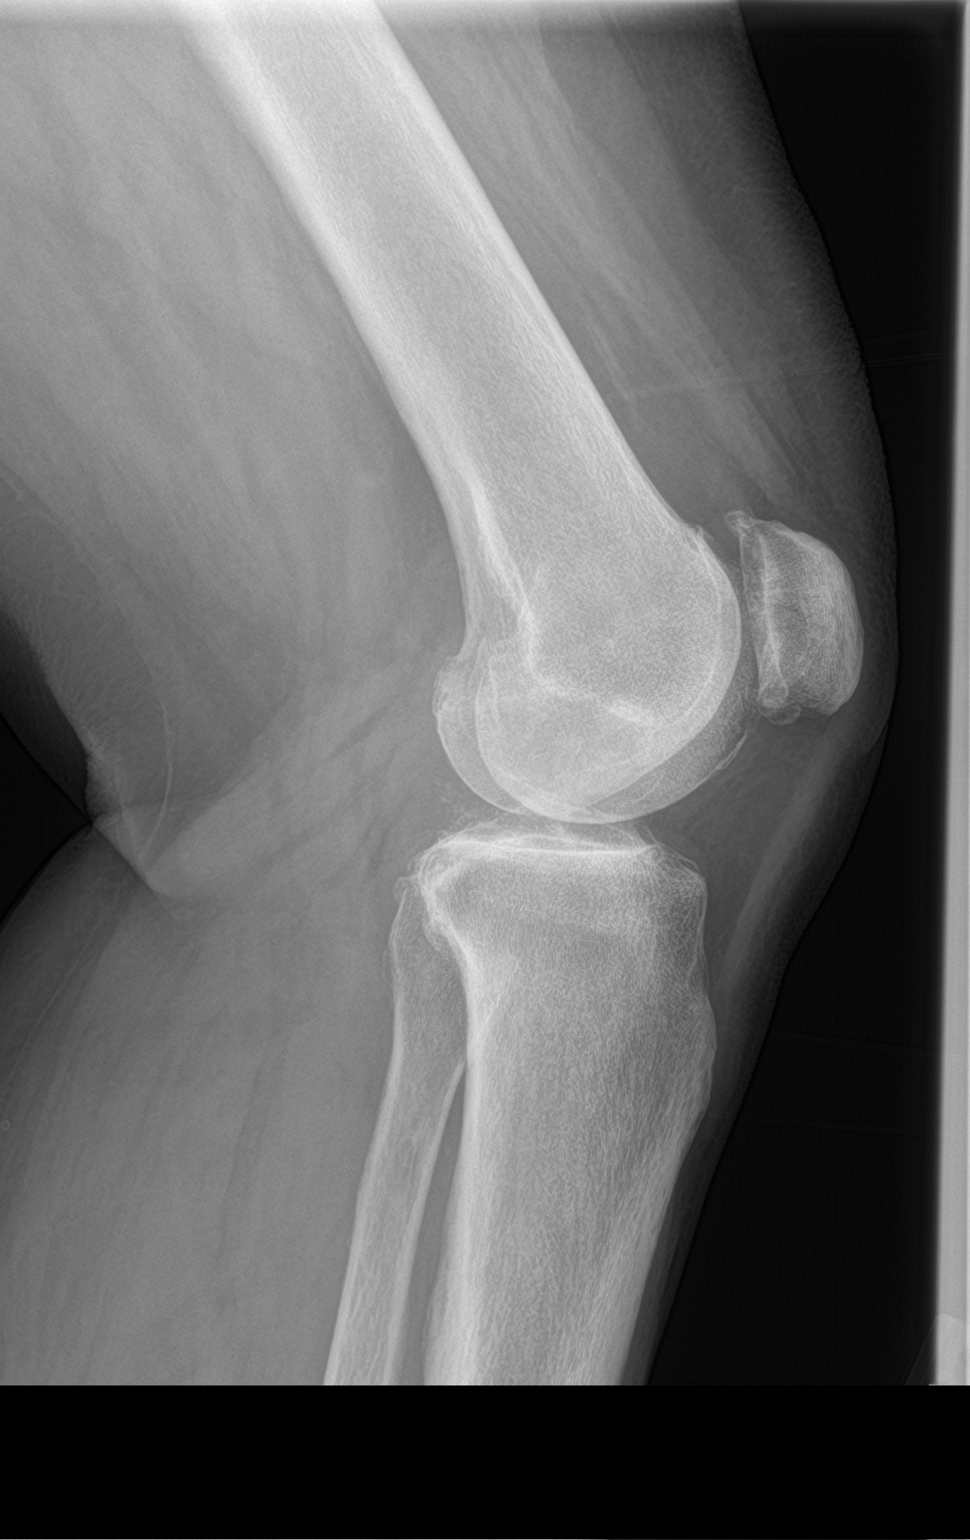

[2 of 2 positions shown; findings below may reference images not displayed]

FINDINGS: Chondrocalcinosis with tricompartment degenerative changes. No joint
effusion. No acute bony abnormality. Specifically, no fracture,
subluxation, or dislocation.
IMPRESSION: Degenerative changes.  No acute bony abnormality.

## 2019-02-21 ENCOUNTER — Other Ambulatory Visit: Payer: Self-pay | Admitting: Internal Medicine

## 2019-03-08 ENCOUNTER — Ambulatory Visit: Payer: Medicare Other | Admitting: Internal Medicine

## 2019-03-09 ENCOUNTER — Other Ambulatory Visit: Payer: Self-pay | Admitting: Internal Medicine

## 2019-03-10 ENCOUNTER — Other Ambulatory Visit: Payer: Self-pay | Admitting: Internal Medicine

## 2019-03-21 ENCOUNTER — Other Ambulatory Visit: Payer: Self-pay | Admitting: Internal Medicine

## 2019-05-18 ENCOUNTER — Other Ambulatory Visit: Payer: Self-pay

## 2019-05-18 MED ORDER — METOPROLOL SUCCINATE ER 25 MG PO TB24: ORAL_TABLET | ORAL | 1 refills | Status: DC

## 2019-05-24 ENCOUNTER — Other Ambulatory Visit: Payer: Self-pay | Admitting: *Deleted

## 2019-05-24 MED ORDER — ATORVASTATIN CALCIUM 40 MG PO TABS: ORAL_TABLET | ORAL | 0 refills | Status: DC

## 2019-06-07 ENCOUNTER — Other Ambulatory Visit: Payer: Self-pay

## 2019-06-07 MED ORDER — LEVOTHYROXINE SODIUM 50 MCG PO TABS: ORAL_TABLET | ORAL | 0 refills | Status: DC

## 2019-06-16 ENCOUNTER — Other Ambulatory Visit: Payer: Self-pay | Admitting: Internal Medicine

## 2019-07-01 ENCOUNTER — Encounter: Payer: Self-pay | Admitting: Internal Medicine

## 2019-07-01 ENCOUNTER — Other Ambulatory Visit (INDEPENDENT_AMBULATORY_CARE_PROVIDER_SITE_OTHER): Payer: Medicare Other

## 2019-07-01 ENCOUNTER — Other Ambulatory Visit: Payer: Self-pay

## 2019-07-01 ENCOUNTER — Ambulatory Visit (INDEPENDENT_AMBULATORY_CARE_PROVIDER_SITE_OTHER): Payer: Medicare Other | Admitting: Internal Medicine

## 2019-07-01 VITALS — BP 116/74 | HR 99 | Temp 98.0°F | Ht 65.0 in | Wt 242.0 lb

## 2019-07-01 DIAGNOSIS — E611 Iron deficiency: Secondary | ICD-10-CM | POA: Diagnosis not present

## 2019-07-01 DIAGNOSIS — F32A Depression, unspecified: Secondary | ICD-10-CM

## 2019-07-01 DIAGNOSIS — E119 Type 2 diabetes mellitus without complications: Secondary | ICD-10-CM

## 2019-07-01 DIAGNOSIS — E538 Deficiency of other specified B group vitamins: Secondary | ICD-10-CM

## 2019-07-01 DIAGNOSIS — E559 Vitamin D deficiency, unspecified: Secondary | ICD-10-CM | POA: Diagnosis not present

## 2019-07-01 DIAGNOSIS — F329 Major depressive disorder, single episode, unspecified: Secondary | ICD-10-CM

## 2019-07-01 DIAGNOSIS — Z0001 Encounter for general adult medical examination with abnormal findings: Secondary | ICD-10-CM

## 2019-07-01 DIAGNOSIS — M79671 Pain in right foot: Secondary | ICD-10-CM | POA: Diagnosis not present

## 2019-07-01 DIAGNOSIS — E785 Hyperlipidemia, unspecified: Secondary | ICD-10-CM

## 2019-07-01 DIAGNOSIS — M79672 Pain in left foot: Secondary | ICD-10-CM

## 2019-07-01 LAB — URINALYSIS, ROUTINE W REFLEX MICROSCOPIC
Bilirubin Urine: NEGATIVE
Hgb urine dipstick: NEGATIVE
Ketones, ur: NEGATIVE
Leukocytes,Ua: NEGATIVE
Nitrite: NEGATIVE
RBC / HPF: NONE SEEN (ref 0–?)
Specific Gravity, Urine: 1.02 (ref 1.000–1.030)
Total Protein, Urine: NEGATIVE
Urine Glucose: >=1000 — AB
Urobilinogen, UA: 0.2 (ref 0.0–1.0)
pH: 5.5 (ref 5.0–8.0)

## 2019-07-01 LAB — CBC WITH DIFFERENTIAL/PLATELET
Basophils Absolute: 0.1 10*3/uL (ref 0.0–0.1)
Basophils Relative: 0.8 % (ref 0.0–3.0)
Eosinophils Absolute: 0.4 10*3/uL (ref 0.0–0.7)
Eosinophils Relative: 2.7 % (ref 0.0–5.0)
HCT: 46.6 % — ABNORMAL HIGH (ref 36.0–46.0)
Hemoglobin: 15.5 g/dL — ABNORMAL HIGH (ref 12.0–15.0)
Lymphocytes Relative: 34.3 % (ref 12.0–46.0)
Lymphs Abs: 5 10*3/uL — ABNORMAL HIGH (ref 0.7–4.0)
MCHC: 33.2 g/dL (ref 30.0–36.0)
MCV: 87.6 fl (ref 78.0–100.0)
Monocytes Absolute: 1 10*3/uL (ref 0.1–1.0)
Monocytes Relative: 6.7 % (ref 3.0–12.0)
Neutro Abs: 8.1 10*3/uL — ABNORMAL HIGH (ref 1.4–7.7)
Neutrophils Relative %: 55.5 % (ref 43.0–77.0)
Platelets: 404 10*3/uL — ABNORMAL HIGH (ref 150.0–400.0)
RBC: 5.32 Mil/uL — ABNORMAL HIGH (ref 3.87–5.11)
RDW: 13.8 % (ref 11.5–15.5)
WBC: 14.5 10*3/uL — ABNORMAL HIGH (ref 4.0–10.5)

## 2019-07-01 LAB — VITAMIN D 25 HYDROXY (VIT D DEFICIENCY, FRACTURES): VITD: 26.18 ng/mL — ABNORMAL LOW (ref 30.00–100.00)

## 2019-07-01 LAB — HEPATIC FUNCTION PANEL
ALT: 25 U/L (ref 0–35)
AST: 23 U/L (ref 0–37)
Albumin: 4.9 g/dL (ref 3.5–5.2)
Alkaline Phosphatase: 89 U/L (ref 39–117)
Bilirubin, Direct: 0 mg/dL (ref 0.0–0.3)
Total Bilirubin: 0.4 mg/dL (ref 0.2–1.2)
Total Protein: 7.7 g/dL (ref 6.0–8.3)

## 2019-07-01 LAB — BASIC METABOLIC PANEL
BUN: 21 mg/dL (ref 6–23)
CO2: 23 mEq/L (ref 19–32)
Calcium: 10.5 mg/dL (ref 8.4–10.5)
Chloride: 100 mEq/L (ref 96–112)
Creatinine, Ser: 0.84 mg/dL (ref 0.40–1.20)
GFR: 66.33 mL/min (ref >60.00)
Glucose, Bld: 128 mg/dL — ABNORMAL HIGH (ref 70–99)
Potassium: 4.1 mEq/L (ref 3.5–5.1)
Sodium: 138 mEq/L (ref 135–145)

## 2019-07-01 LAB — LDL CHOLESTEROL, DIRECT: Direct LDL: 96 mg/dL

## 2019-07-01 LAB — LIPID PANEL
Cholesterol: 169 mg/dL (ref 0–200)
HDL: 39 mg/dL — ABNORMAL LOW (ref >39.00)
NonHDL: 129.58
Total CHOL/HDL Ratio: 4
Triglycerides: 354 mg/dL — ABNORMAL HIGH (ref 0.0–149.0)
VLDL: 70.8 mg/dL — ABNORMAL HIGH (ref 0.0–40.0)

## 2019-07-01 LAB — MICROALBUMIN / CREATININE URINE RATIO
Creatinine,U: 59.8 mg/dL
Microalb Creat Ratio: 1.2 mg/g (ref 0.0–30.0)
Microalb, Ur: <0.7 mg/dL (ref 0.0–1.9)

## 2019-07-01 LAB — TSH: TSH: 3.4 u[IU]/mL (ref 0.35–4.50)

## 2019-07-01 LAB — HEMOGLOBIN A1C: Hgb A1c MFr Bld: 7.7 % — ABNORMAL HIGH (ref 4.6–6.5)

## 2019-07-01 LAB — IBC PANEL
Iron: 87 ug/dL (ref 42–145)
Saturation Ratios: 18.3 % — ABNORMAL LOW (ref 20.0–50.0)
Transferrin: 340 mg/dL (ref 212.0–360.0)

## 2019-07-01 LAB — VITAMIN B12: Vitamin B-12: 557 pg/mL (ref 211–911)

## 2019-07-01 MED ORDER — ONETOUCH VERIO W/DEVICE KIT: PACK | 0 refills | Status: DC

## 2019-07-01 MED ORDER — LANCETS MISC: 3 refills | Status: DC

## 2019-07-01 MED ORDER — ONETOUCH VERIO VI STRP: ORAL_STRIP | 12 refills | Status: DC

## 2019-07-01 NOTE — Progress Notes (Signed)
Subjective:    Patient ID: Alexandra Wolfe, female    DOB: 1946-10-15, 73 y.o.   MRN: [id number]  HPI  Here for wellness and f/u;  Overall doing ok;  Pt denies Chest pain, worsening SOB, DOE, wheezing, orthopnea, PND, worsening LE edema, palpitations, dizziness or syncope, though has persistent sob doe obesity related..  Pt denies neurological change such as new headache, facial or extremity weakness.  Pt denies polydipsia, polyuria, or low sugar symptoms. Pt states overall good compliance with treatment and medications, good tolerability, and has been trying to follow appropriate diet.  Pt denies worsening depressive symptoms, suicidal ideation or panic. No fever, night sweats, wt loss, loss of appetite, or other constitutional symptoms.  Pt states good ability with ADL's, has low fall risk, home safety reviewed and adequate, no other significant changes in hearing or vision, and only occasionally active with exercise.  Lost 4 lbs with better diet.  Wt Readings from Last 3 Encounters:  07/01/19 242 lb (109.8 kg)  09/04/18 246 lb 3.2 oz (111.7 kg)  07/06/18 248 lb (112.5 kg)  Also c/o bilat foot pain, plantar bilat, mod to occasionally severe, intermittent, occasionally with pain at rest, sharp, without radiation, nothing else makes better or worse. Past Medical History:  Diagnosis Date   ALLERGIC RHINITIS 04/02/2009   no per pt   Anxiety    BACK PAIN 09/27/2008   BUNIONS, BILATERAL 12/23/2007   CHEST DISCOMFORT, ATYPICAL 11/07/2009   Chronic LBP    COLONIC POLYPS, HX OF 09/13/2007   CONSTIPATION 09/27/2008   DEPRESSION 09/13/2007   Diabetes mellitus    diet controlled   DYSPNEA ON EXERTION 02/06/2010   Eustachian tube dysfunction 05/20/2011   FOOT PAIN, RIGHT 09/27/2008   HYPERLIPIDEMIA 03/11/2008   LBP (low back pain) 05/20/2011   Leukocytosis 11/20/2011   OSTEOARTHROSIS NOS, LOWER LEG 09/13/2007   SHOULDER PAIN, LEFT 02/14/2009   SVT (supraventricular tachycardia) ([place])      SYMPTOM, PALPITATIONS 09/13/2007   URINARY INCONTINENCE 08/23/2009   Vertigo 05/20/2011   Past Surgical History:  Procedure Laterality Date   BUNIONECTOMY     right   ccx     CHOLECYSTECTOMY     LUMBAR LAMINECTOMY     LUMBAR LAMINECTOMY     SHOULDER ARTHROSCOPY  12/13/2011   Procedure: ARTHROSCOPY SHOULDER;  Surgeon: Augustin Schooling;  Location: [facility];  Service: Orthopedics;  Laterality: Left;  Left Shoulder ArthroscopyDebridement Limited Tenodesis Open Rotator Cuff Repair Spur Removal Right Shoulder Injection     reports that she has quit smoking. She has a 10.00 pack-year smoking history. She has never used smokeless tobacco. She reports that she does not drink alcohol or use drugs. family history includes Colon cancer in her mother; Diabetes in an other family member; Diabetes type II in her father; Heart disease in her brother and father. Allergies  Allergen Reactions   Aripiprazole     REACTION: agitation   Simvastatin     REACTION: myalgia   Current Outpatient Medications on File Prior to Visit  Medication Sig Dispense Refill   aspirin 81 MG tablet Take 81 mg by mouth daily.       atorvastatin (LIPITOR) 40 MG tablet TAKE 1 TABLET(40 MG) BY MOUTH DAILY 90 tablet 0   budesonide-formoterol (SYMBICORT) 160-4.5 MCG/ACT inhaler Inhale 2 puffs into the lungs 2 (two) times daily. 1 Inhaler 12   diclofenac (VOLTAREN) 75 MG EC tablet Take 75 mg by mouth 2 (two) times daily.  empagliflozin (JARDIANCE) 25 MG TABS tablet Take 25 mg by mouth daily. 90 tablet 3   fluticasone (FLONASE) 50 MCG/ACT nasal spray SHAKE LIQUID AND USE 2 SPRAYS IN EACH NOSTRIL DAILY FOR ALLERGIES 16 g 5   hydrochlorothiazide (HYDRODIURIL) 25 MG tablet TAKE 1/2 TABLET(12.5 MG) BY MOUTH DAILY 45 tablet 2   levothyroxine (SYNTHROID) 50 MCG tablet TAKE 1 TABLET(50 MCG) BY MOUTH DAILY 90 tablet 0   meclizine (ANTIVERT) 25 MG tablet Take 1 tablet (25 mg total) by mouth 3 (three) times daily as needed  for dizziness. 50 tablet 1   metFORMIN (GLUCOPHAGE-XR) 500 MG 24 hr tablet TAKE 4 TABLETS BY MOUTH EVERY MORNING 360 tablet 1   metoprolol succinate (TOPROL-XL) 25 MG 24 hr tablet TAKE 1 TABLET(25 MG) BY MOUTH DAILY 90 tablet 1   ondansetron (ZOFRAN ODT) 8 MG disintegrating tablet Take 1 tablet (8 mg total) by mouth every 8 (eight) hours as needed for nausea or vomiting. 40 tablet 0   oxybutynin (DITROPAN XL) 10 MG 24 hr tablet Take 1 tablet (10 mg total) by mouth at bedtime. 90 tablet 3   traZODone (DESYREL) 50 MG tablet TAKE 1 TABLET(50 MG) BY MOUTH AT BEDTIME 30 tablet 5   No current facility-administered medications on file prior to visit.    Review of Systems Constitutional: Negative for other unusual diaphoresis, sweats, appetite or weight changes HENT: Negative for other worsening hearing loss, ear pain, facial swelling, mouth sores or neck stiffness.   Eyes: Negative for other worsening pain, redness or other visual disturbance.  Respiratory: Negative for other stridor or swelling Cardiovascular: Negative for other palpitations or other chest pain  Gastrointestinal: Negative for worsening diarrhea or loose stools, blood in stool, distention or other pain Genitourinary: Negative for hematuria, flank pain or other change in urine volume.  Musculoskeletal: Negative for myalgias or other joint swelling.  Skin: Negative for other color change, or other wound or worsening drainage.  Neurological: Negative for other syncope or numbness. Hematological: Negative for other adenopathy or swelling Psychiatric/Behavioral: Negative for hallucinations, other worsening agitation, SI, self-injury, or new decreased concentration All other system neg per pt    Objective:   Physical Exam BP 116/74    Pulse 99    Temp 98 F (36.7 C) (Oral)    Ht 5\' 5"  (1.651 m)    Wt 242 lb (109.8 kg)    SpO2 97%    BMI 40.27 kg/m  VS noted,  Constitutional: Pt is oriented to person, place, and time. Appears  well-developed and well-nourished, in no significant distress and comfortable Head: Normocephalic and atraumatic  Eyes: Conjunctivae and EOM are normal. Pupils are equal, round, and reactive to light Right Ear: External ear normal without discharge Left Ear: External ear normal without discharge Nose: Nose without discharge or deformity Mouth/Throat: Oropharynx is without other ulcerations and moist  Neck: Normal range of motion. Neck supple. No JVD present. No tracheal deviation present or significant neck LA or mass Cardiovascular: Normal rate, regular rhythm, normal heart sounds and intact distal pulses.   Pulmonary/Chest: WOB normal and breath sounds without rales or wheezing  Abdominal: Soft. Bowel sounds are normal. NT. No HSM  Musculoskeletal: Normal range of motion. Exhibits no edema Lymphadenopathy: Has no other cervical adenopathy.  Neurological: Pt is alert and oriented to person, place, and time. Pt has normal reflexes. No cranial nerve deficit. Motor grossly intact, Gait intact Skin: Skin is warm and dry. No rash noted or new ulcerations Psychiatric:  Has normal  mood and affect. Behavior is normal without agitation No other exam findings  Lab Results  Component Value Date   WBC 12.5 (H) 03/04/2018   HGB 14.6 03/04/2018   HCT 43.2 03/04/2018   PLT 387.0 03/04/2018   GLUCOSE 161 (H) 09/04/2018   CHOL 151 09/04/2018   TRIG 255.0 (H) 09/04/2018   HDL 40.60 09/04/2018   LDLDIRECT 85.0 09/04/2018   LDLCALC 66 03/04/2018   ALT 22 09/04/2018   AST 24 09/04/2018   NA 138 09/04/2018   K 4.3 09/04/2018   CL 101 09/04/2018   CREATININE 0.82 09/04/2018   BUN 15 09/04/2018   CO2 24 09/04/2018   TSH 3.60 03/04/2018   INR 1.0 ratio 11/13/2009   HGBA1C 8.5 (H) 09/04/2018   MICROALBUR <0.7 03/04/2018       Assessment & Plan:

## 2019-07-01 NOTE — Assessment & Plan Note (Addendum)
Etiology unclear, for referral Dr Smith/[facility]  In addition to the time spent performing CPE, I spent an additional 25 minutes face to face,in which greater than 50% of this time was spent in counseling and coordination of care for patient's acute illness as documented, including the differential dx, treatment, further evaluation and other management of bialt foot pain, HLD, DM, depression

## 2019-07-01 NOTE — Assessment & Plan Note (Signed)
stable overall by history and exam, recent data reviewed with pt, and pt to continue medical treatment as before,  to f/u any worsening symptoms or concerns  

## 2019-07-01 NOTE — Assessment & Plan Note (Signed)

## 2019-07-01 NOTE — Patient Instructions (Signed)
Your new glucometer and supplies were sent to the pharmacy, to check you sugars at least twice per day  Please continue all other medications as before, and refills have been done if requested.  Please have the pharmacy call with any other refills you may need.  Please continue your efforts at being more active, low cholesterol diet, and weight control.  You are otherwise up to date with prevention measures today.  Please keep your appointments with your specialists as you may have planned  You will be contacted regarding the referral for: Dr [facility] in this office for the feet pain  Please go to the LAB in the Basement (turn left off the elevator) for the tests to be done today  You will be contacted by phone if any changes need to be made immediately.  Otherwise, you will receive a letter about your results with an explanation, but please check with MyChart first.  Please remember to sign up for MyChart if you have not done so, as this will be important to you in the future with finding out test results, communicating by private email, and scheduling acute appointments online when needed.  Please return in 6 months, or sooner if needed, with Lab testing done 3-5 days before

## 2019-07-05 ENCOUNTER — Other Ambulatory Visit: Payer: Self-pay | Admitting: Internal Medicine

## 2019-07-05 ENCOUNTER — Encounter: Payer: Self-pay | Admitting: Internal Medicine

## 2019-07-05 MED ORDER — VITAMIN D (ERGOCALCIFEROL) 1.25 MG (50000 UNIT) PO CAPS: 50000.0000 [IU] | ORAL_CAPSULE | ORAL | 0 refills | Status: DC

## 2019-07-05 MED ORDER — ATORVASTATIN CALCIUM 80 MG PO TABS: 80.0000 mg | ORAL_TABLET | Freq: Every day | ORAL | 3 refills | Status: DC

## 2019-07-05 MED ORDER — GLIPIZIDE ER 2.5 MG PO TB24: 2.5000 mg | ORAL_TABLET | Freq: Every day | ORAL | 3 refills | Status: DC

## 2019-07-07 ENCOUNTER — Telehealth: Payer: Self-pay | Admitting: Internal Medicine

## 2019-07-07 NOTE — Telephone Encounter (Signed)
Pt has been informed and expressed understanding.  

## 2019-07-07 NOTE — Telephone Encounter (Signed)
Relation to pt: self  Call back [phone]    Reason for call:  Patient in need of clarity regarding why the following medication was prescribed glipiZIDE (GLUCOTROL XL) 2.5 MG 24 hr tablet and Vitamin D, Ergocalciferol, (DRISDOL) 1.25 MG (50000 UT) CAPS capsule , please advise

## 2019-07-08 DIAGNOSIS — Z1231 Encounter for screening mammogram for malignant neoplasm of breast: Secondary | ICD-10-CM | POA: Diagnosis not present

## 2019-07-08 LAB — HM MAMMOGRAPHY

## 2019-07-22 ENCOUNTER — Ambulatory Visit (INDEPENDENT_AMBULATORY_CARE_PROVIDER_SITE_OTHER): Payer: Medicare Other | Admitting: Internal Medicine

## 2019-07-22 DIAGNOSIS — E119 Type 2 diabetes mellitus without complications: Secondary | ICD-10-CM

## 2019-07-22 DIAGNOSIS — H6091 Unspecified otitis externa, right ear: Secondary | ICD-10-CM

## 2019-07-22 MED ORDER — LEVOFLOXACIN 500 MG PO TABS: 500.0000 mg | ORAL_TABLET | Freq: Every day | ORAL | 0 refills | Status: AC

## 2019-07-22 NOTE — Progress Notes (Signed)
Patient ID: Alexandra Wolfe, female   DOB: 07-02-1946, 73 y.o.   MRN: [id number]  Phone visit  Cumulative time during 7-day interval 14 min, there was not an associated office visit for this concern within a 7 day period . Verbal consent for services obtained from patient prior to services given.  Names of all persons present for services: Cathlean Cower, MD, patient  Chief complaint: right ear pai  History, background, results pertinent:  Here with acute onset 3 days right ear pain, tender to touch and seems swollen somehow, with slight d/c, HA, feverish, but without sinus pain or pressure, heating pad helps somewhat, but nothing else makes better or worse.  Pt denies chest pain, increased sob or doe, wheezing, orthopnea, PND, increased LE swelling, palpitations, dizziness or syncope.   Past Medical History:  Diagnosis Date  . ALLERGIC RHINITIS 04/02/2009   no per pt  . Anxiety   . BACK PAIN 09/27/2008  . BUNIONS, BILATERAL 12/23/2007  . CHEST DISCOMFORT, ATYPICAL 11/07/2009  . Chronic LBP   . COLONIC POLYPS, HX OF 09/13/2007  . CONSTIPATION 09/27/2008  . DEPRESSION 09/13/2007  . Diabetes mellitus    diet controlled  . DYSPNEA ON EXERTION 02/06/2010  . Eustachian tube dysfunction 05/20/2011  . FOOT PAIN, RIGHT 09/27/2008  . HYPERLIPIDEMIA 03/11/2008  . LBP (low back pain) 05/20/2011  . Leukocytosis 11/20/2011  . OSTEOARTHROSIS NOS, LOWER LEG 09/13/2007  . SHOULDER PAIN, LEFT 02/14/2009  . SVT (supraventricular tachycardia) ([place])   . SYMPTOM, PALPITATIONS 09/13/2007  . URINARY INCONTINENCE 08/23/2009  . Vertigo 05/20/2011   No results found for this or any previous visit (from the past 48 hour(s)).  Current Outpatient Medications on File Prior to Visit  Medication Sig Dispense Refill  . aspirin 81 MG tablet Take 81 mg by mouth daily.      [name] atorvastatin (LIPITOR) 80 MG tablet Take 1 tablet (80 mg total) by mouth daily. 90 tablet 3  . Blood Glucose Monitoring Suppl (ONETOUCH VERIO) w/Device  KIT Use as directed twice daily E11.9 1 kit 0  . budesonide-formoterol (SYMBICORT) 160-4.5 MCG/ACT inhaler Inhale 2 puffs into the lungs 2 (two) times daily. 1 Inhaler 12  . diclofenac (VOLTAREN) 75 MG EC tablet Take 75 mg by mouth 2 (two) times daily.    . empagliflozin (JARDIANCE) 25 MG TABS tablet Take 25 mg by mouth daily. 90 tablet 3  . fluticasone (FLONASE) 50 MCG/ACT nasal spray SHAKE LIQUID AND USE 2 SPRAYS IN EACH NOSTRIL DAILY FOR ALLERGIES 16 g 5  . glipiZIDE (GLUCOTROL XL) 2.5 MG 24 hr tablet Take 1 tablet (2.5 mg total) by mouth daily with breakfast. 90 tablet 3  . glucose blood (ONETOUCH VERIO) test strip Use as instructed twice per day E11.9 200 each 12  . hydrochlorothiazide (HYDRODIURIL) 25 MG tablet TAKE 1/2 TABLET(12.5 MG) BY MOUTH DAILY 45 tablet 2  . Lancets MISC Use as directed twice per day E11.9 200 each 3  . levothyroxine (SYNTHROID) 50 MCG tablet TAKE 1 TABLET(50 MCG) BY MOUTH DAILY 90 tablet 0  . meclizine (ANTIVERT) 25 MG tablet Take 1 tablet (25 mg total) by mouth 3 (three) times daily as needed for dizziness. 50 tablet 1  . metFORMIN (GLUCOPHAGE-XR) 500 MG 24 hr tablet TAKE 4 TABLETS BY MOUTH EVERY MORNING 360 tablet 1  . metoprolol succinate (TOPROL-XL) 25 MG 24 hr tablet TAKE 1 TABLET(25 MG) BY MOUTH DAILY 90 tablet 1  . ondansetron (ZOFRAN ODT) 8 MG disintegrating tablet Take 1 tablet (  8 mg total) by mouth every 8 (eight) hours as needed for nausea or vomiting. 40 tablet 0  . oxybutynin (DITROPAN XL) 10 MG 24 hr tablet Take 1 tablet (10 mg total) by mouth at bedtime. 90 tablet 3  . traZODone (DESYREL) 50 MG tablet TAKE 1 TABLET(50 MG) BY MOUTH AT BEDTIME 30 tablet 5  . Vitamin D, Ergocalciferol, (DRISDOL) 1.25 MG (50000 UT) CAPS capsule Take 1 capsule (50,000 Units total) by mouth every 7 (seven) days. 12 capsule 0   No current facility-administered medications on file prior to visit.    Lab Results  Component Value Date   WBC 14.5 (H) 07/01/2019   HGB 15.5 (H)  07/01/2019   HCT 46.6 (H) 07/01/2019   PLT 404.0 (H) 07/01/2019   GLUCOSE 128 (H) 07/01/2019   CHOL 169 07/01/2019   TRIG 354.0 (H) 07/01/2019   HDL 39.00 (L) 07/01/2019   LDLDIRECT 96.0 07/01/2019   LDLCALC 66 03/04/2018   ALT 25 07/01/2019   AST 23 07/01/2019   NA 138 07/01/2019   K 4.1 07/01/2019   CL 100 07/01/2019   CREATININE 0.84 07/01/2019   BUN 21 07/01/2019   CO2 23 07/01/2019   TSH 3.40 07/01/2019   INR 1.0 ratio 11/13/2009   HGBA1C 7.7 (H) 07/01/2019   MICROALBUR <0.7 07/01/2019   A/P/next steps:   Right external otitis - Mild to mod, for antibx course,  to f/u any worsening symptoms or concerns  DM - stable overall by history and exam, recent data reviewed with pt, and pt to continue medical treatment as before,  to f/u any worsening symptoms or concerns  Cathlean Cower MD

## 2019-07-25 ENCOUNTER — Encounter: Payer: Self-pay | Admitting: Internal Medicine

## 2019-07-25 DIAGNOSIS — H6091 Unspecified otitis externa, right ear: Secondary | ICD-10-CM | POA: Insufficient documentation

## 2019-07-25 NOTE — Patient Instructions (Signed)
Please take all new medication as prescribed - the antibiotic  Please continue all other medications as before, and refills have been done if requested.  Please have the pharmacy call with any other refills you may need.  Please keep your appointments with your specialists as you may have planned   

## 2019-07-25 NOTE — Assessment & Plan Note (Signed)
stable overall by history and exam, recent data reviewed with pt, and pt to continue medical treatment as before,  to f/u any worsening symptoms or concerns  

## 2019-07-25 NOTE — Assessment & Plan Note (Signed)
Mild to mod, for antibx course,  to f/u any worsening symptoms or concerns 

## 2019-07-29 ENCOUNTER — Ambulatory Visit: Payer: Medicare Other | Admitting: Family Medicine

## 2019-09-03 ENCOUNTER — Other Ambulatory Visit: Payer: Self-pay | Admitting: *Deleted

## 2019-09-03 MED ORDER — JARDIANCE 25 MG PO TABS: 25.0000 mg | ORAL_TABLET | Freq: Every day | ORAL | 3 refills | Status: DC

## 2019-09-08 ENCOUNTER — Other Ambulatory Visit: Payer: Self-pay | Admitting: Internal Medicine

## 2019-09-08 DIAGNOSIS — N3946 Mixed incontinence: Secondary | ICD-10-CM | POA: Diagnosis not present

## 2019-09-08 DIAGNOSIS — R350 Frequency of micturition: Secondary | ICD-10-CM | POA: Diagnosis not present

## 2019-09-08 MED ORDER — LEVOTHYROXINE SODIUM 50 MCG PO TABS: ORAL_TABLET | ORAL | 1 refills | Status: DC

## 2019-09-08 MED ORDER — METFORMIN HCL ER 500 MG PO TB24: ORAL_TABLET | ORAL | 1 refills | Status: DC

## 2019-09-08 NOTE — Telephone Encounter (Signed)
Medication Refill - Medication: levothyroxine (SYNTHROID) 50 MCG tablet metFORMIN (GLUCOPHAGE-XR) 500 MG 24 hr tablet   Has the patient contacted their pharmacy? Yes.   (Agent: If no, request that the patient contact the pharmacy for the refill.) (Agent: If yes, when and what did the pharmacy advise?)  Preferred Pharmacy (with phone number or street name):  [facility] [address], [address]  [place] [address]  Phone: [phone] Fax: [phone]     Agent: Please be advised that RX refills may take up to 3 business days. We ask that you follow-up with your pharmacy.

## 2019-09-30 DIAGNOSIS — M25561 Pain in right knee: Secondary | ICD-10-CM | POA: Diagnosis not present

## 2019-09-30 DIAGNOSIS — M25562 Pain in left knee: Secondary | ICD-10-CM | POA: Diagnosis not present

## 2019-09-30 DIAGNOSIS — M170 Bilateral primary osteoarthritis of knee: Secondary | ICD-10-CM | POA: Diagnosis not present

## 2019-10-01 DIAGNOSIS — Z961 Presence of intraocular lens: Secondary | ICD-10-CM | POA: Diagnosis not present

## 2019-10-01 DIAGNOSIS — E119 Type 2 diabetes mellitus without complications: Secondary | ICD-10-CM | POA: Diagnosis not present

## 2019-11-03 DIAGNOSIS — N3946 Mixed incontinence: Secondary | ICD-10-CM | POA: Diagnosis not present

## 2019-11-03 DIAGNOSIS — N3944 Nocturnal enuresis: Secondary | ICD-10-CM | POA: Diagnosis not present

## 2019-11-09 ENCOUNTER — Other Ambulatory Visit: Payer: Self-pay | Admitting: *Deleted

## 2019-11-09 MED ORDER — METOPROLOL SUCCINATE ER 25 MG PO TB24: ORAL_TABLET | ORAL | 1 refills | Status: DC

## 2019-12-02 ENCOUNTER — Other Ambulatory Visit: Payer: Self-pay | Admitting: Internal Medicine

## 2019-12-02 MED ORDER — TRAZODONE HCL 50 MG PO TABS: ORAL_TABLET | ORAL | 5 refills | Status: DC

## 2020-01-04 ENCOUNTER — Encounter: Payer: Self-pay | Admitting: Internal Medicine

## 2020-01-04 ENCOUNTER — Other Ambulatory Visit: Payer: Self-pay

## 2020-01-04 ENCOUNTER — Ambulatory Visit (INDEPENDENT_AMBULATORY_CARE_PROVIDER_SITE_OTHER): Payer: Medicare Other

## 2020-01-04 ENCOUNTER — Ambulatory Visit (INDEPENDENT_AMBULATORY_CARE_PROVIDER_SITE_OTHER): Payer: Medicare Other | Admitting: Internal Medicine

## 2020-01-04 VITALS — BP 132/76 | HR 77 | Temp 98.5°F | Ht 65.0 in

## 2020-01-04 DIAGNOSIS — R0609 Other forms of dyspnea: Secondary | ICD-10-CM | POA: Insufficient documentation

## 2020-01-04 DIAGNOSIS — J309 Allergic rhinitis, unspecified: Secondary | ICD-10-CM | POA: Diagnosis not present

## 2020-01-04 DIAGNOSIS — E785 Hyperlipidemia, unspecified: Secondary | ICD-10-CM

## 2020-01-04 DIAGNOSIS — R0602 Shortness of breath: Secondary | ICD-10-CM | POA: Diagnosis not present

## 2020-01-04 DIAGNOSIS — E119 Type 2 diabetes mellitus without complications: Secondary | ICD-10-CM | POA: Diagnosis not present

## 2020-01-04 LAB — BASIC METABOLIC PANEL
BUN: 21 mg/dL (ref 6–23)
CO2: 23 mEq/L (ref 19–32)
Calcium: 10.1 mg/dL (ref 8.4–10.5)
Chloride: 101 mEq/L (ref 96–112)
Creatinine, Ser: 0.8 mg/dL (ref 0.40–1.20)
GFR: 70.08 mL/min (ref >60.00)
Glucose, Bld: 90 mg/dL (ref 70–99)
Potassium: 4 mEq/L (ref 3.5–5.1)
Sodium: 135 mEq/L (ref 135–145)

## 2020-01-04 LAB — LDL CHOLESTEROL, DIRECT: Direct LDL: 74 mg/dL

## 2020-01-04 LAB — LIPID PANEL
Cholesterol: 137 mg/dL (ref 0–200)
HDL: 37.9 mg/dL — ABNORMAL LOW (ref >39.00)
NonHDL: 99.02
Total CHOL/HDL Ratio: 4
Triglycerides: 214 mg/dL — ABNORMAL HIGH (ref 0.0–149.0)
VLDL: 42.8 mg/dL — ABNORMAL HIGH (ref 0.0–40.0)

## 2020-01-04 LAB — HEPATIC FUNCTION PANEL
ALT: 19 U/L (ref 0–35)
AST: 21 U/L (ref 0–37)
Albumin: 4.6 g/dL (ref 3.5–5.2)
Alkaline Phosphatase: 67 U/L (ref 39–117)
Bilirubin, Direct: 0.1 mg/dL (ref 0.0–0.3)
Total Bilirubin: 0.4 mg/dL (ref 0.2–1.2)
Total Protein: 7.1 g/dL (ref 6.0–8.3)

## 2020-01-04 LAB — HEMOGLOBIN A1C: Hgb A1c MFr Bld: 6.3 % (ref 4.6–6.5)

## 2020-01-04 IMAGING — DX DG CHEST 2V
2 series · 2 of 2 positions shown · non-contrast
Comparison: PA and lateral chest [DATE].  CT chest [DATE].

CLINICAL DATA: Worsening chronic cough and shortness of breath.

EXAM:
CHEST - 2 VIEW

[chest pa]
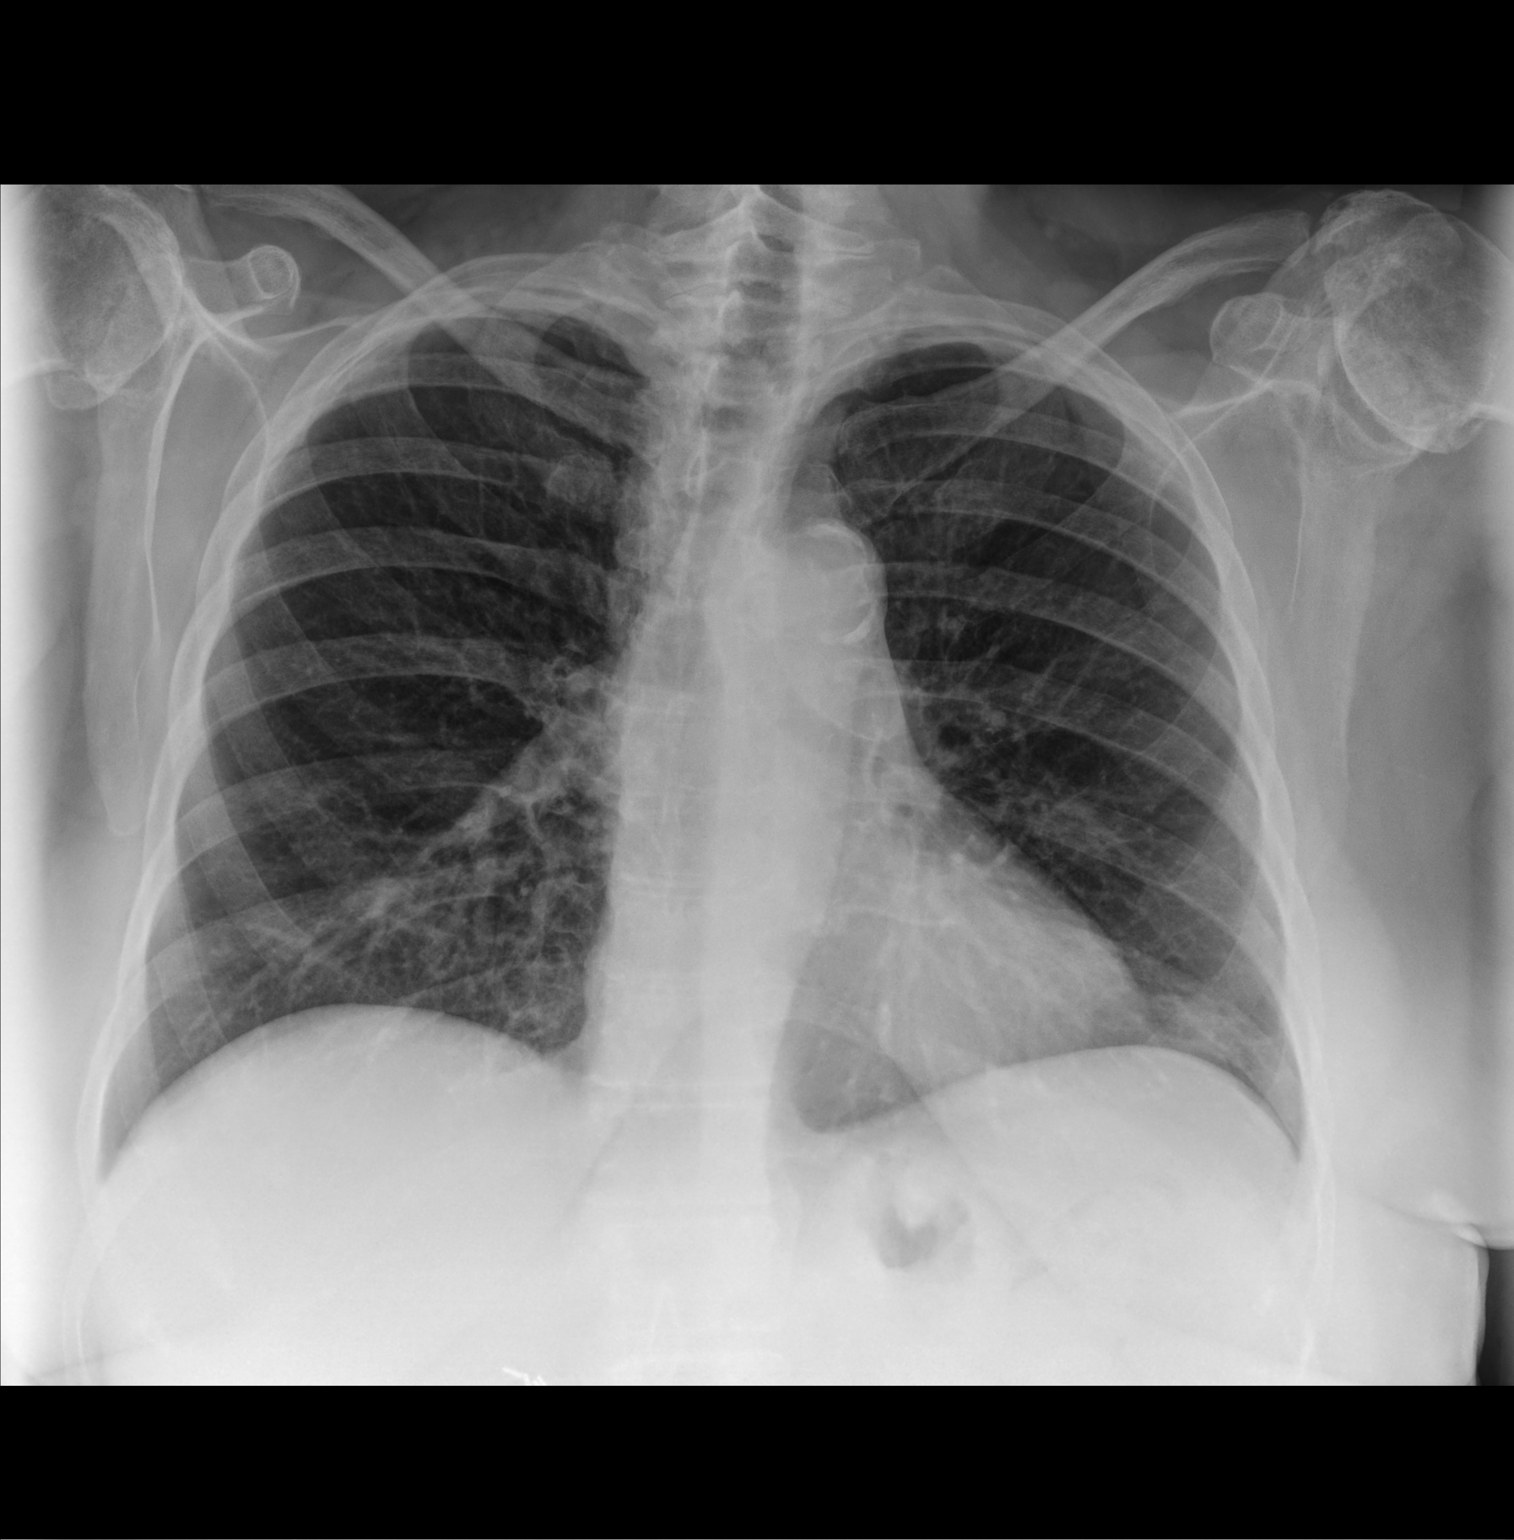

[chest lat]
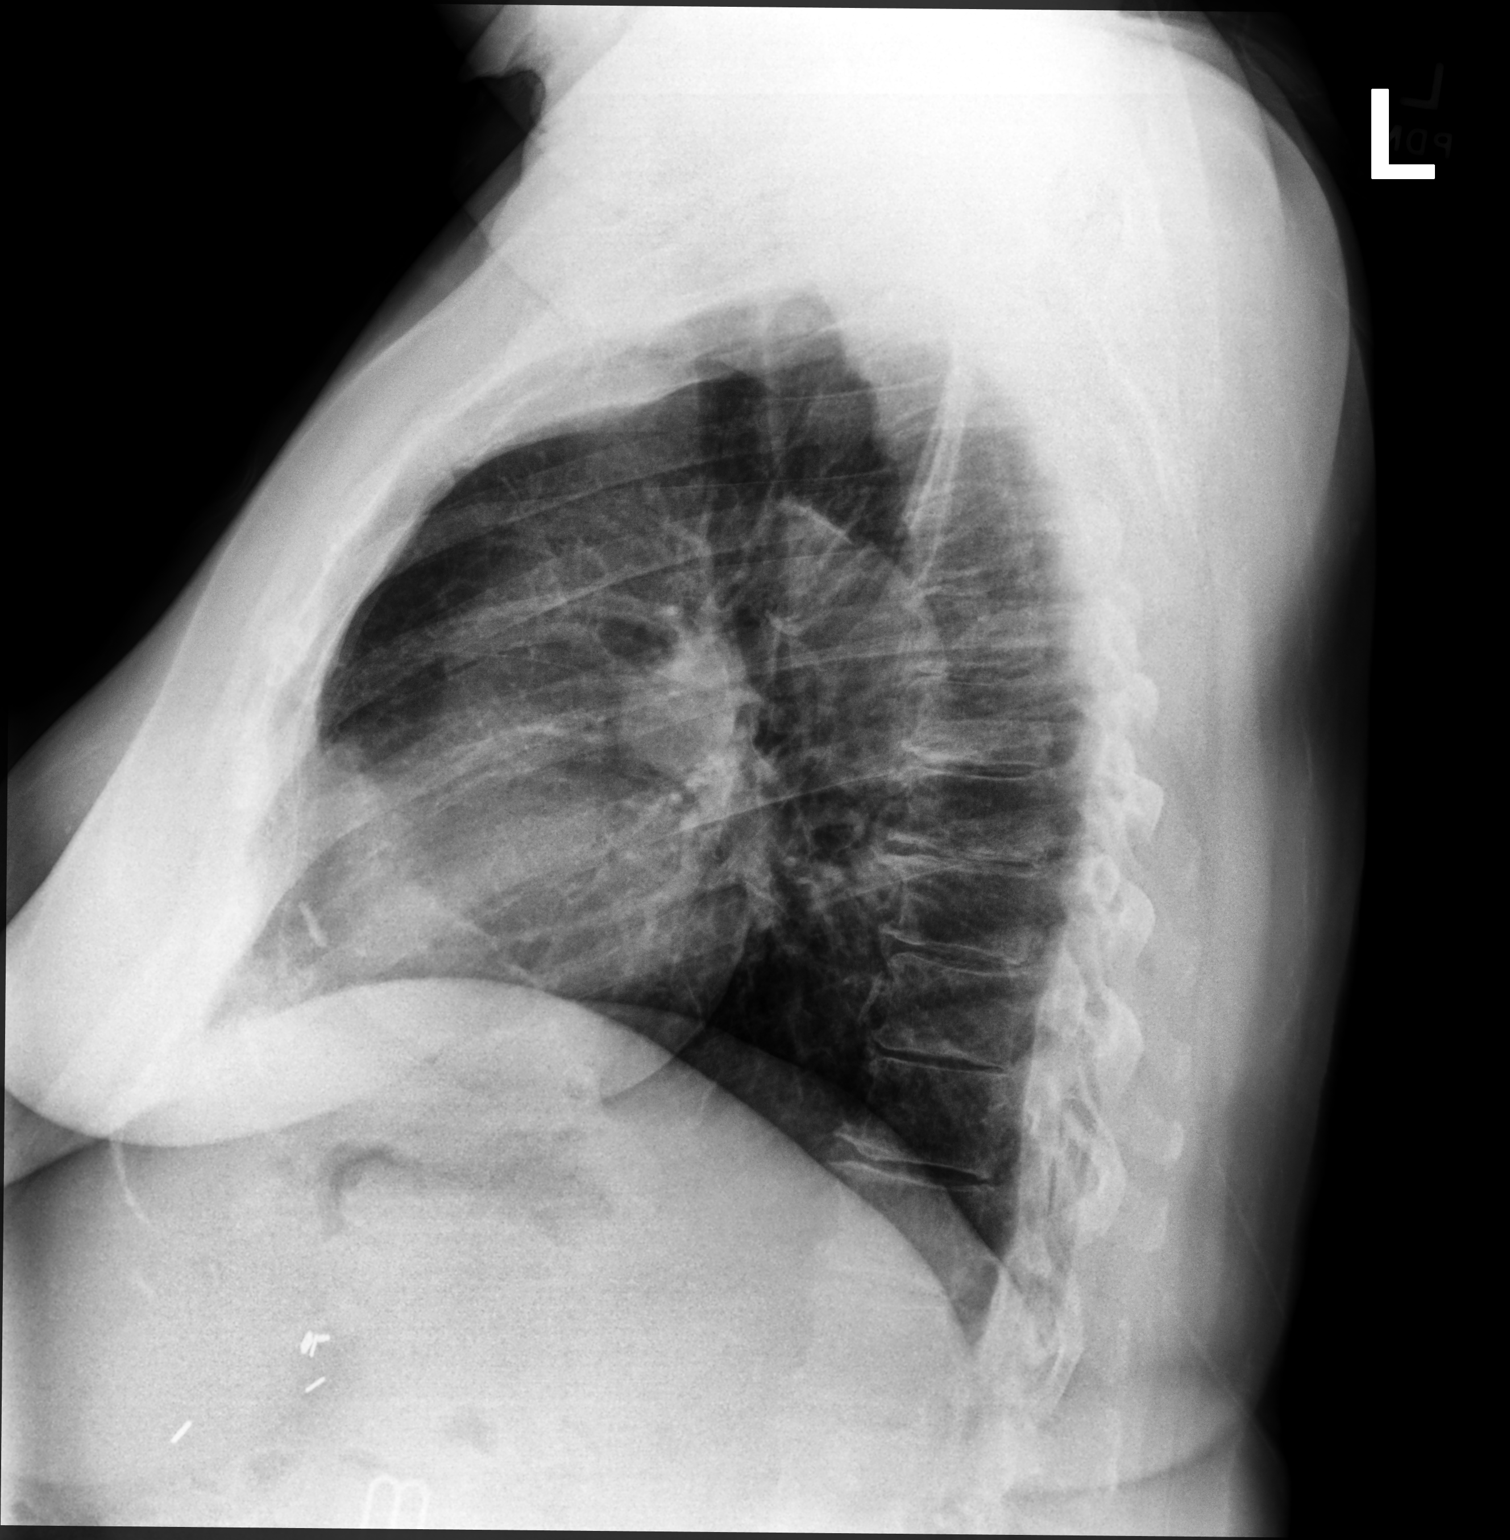

[2 of 2 positions shown; findings below may reference images not displayed]

FINDINGS: The lungs are clear. Heart size is normal. No pneumothorax or
pleural effusion. Atherosclerosis is noted. No acute or focal bony
abnormality.
IMPRESSION: No acute disease.

Atherosclerosis.

## 2020-01-04 MED ORDER — CETIRIZINE HCL 10 MG PO TABS: 10.0000 mg | ORAL_TABLET | Freq: Every day | ORAL | 11 refills | Status: DC

## 2020-01-04 MED ORDER — ALBUTEROL SULFATE HFA 108 (90 BASE) MCG/ACT IN AERS: 2.0000 | INHALATION_SPRAY | Freq: Four times a day (QID) | RESPIRATORY_TRACT | 5 refills | Status: DC | PRN

## 2020-01-04 NOTE — Assessment & Plan Note (Signed)
[place] for albuterol hfa prn, also for cxr recheck

## 2020-01-04 NOTE — Assessment & Plan Note (Addendum)
stable overall by history and exam, recent data reviewed with pt, and pt to continue medical treatment as before,  to f/u any worsening symptoms or concerns  I spent 32 minutes preparing to see the patient by review of recent labs, imaging and procedures, obtaining and reviewing separately obtained history, communicating with the patient and family or caregiver, ordering medications, tests or procedures, and documenting clinical information in the EHR including the differential Dx, treatment, and any further evaluation and other management of chronic dypsnea, allergies, DM, HLD

## 2020-01-04 NOTE — Assessment & Plan Note (Signed)
stable overall by history and exam, recent data reviewed with pt, and pt to continue medical treatment as before,  to f/u any worsening symptoms or concerns  

## 2020-01-04 NOTE — Patient Instructions (Addendum)
Please take all new medication as prescribed - the albuterol inhaler as needed, and zyrtec as needed  Please continue all other medications as before, and refills have been done if requested.  Please have the pharmacy call with any other refills you may need.  Please continue your efforts at being more active, low cholesterol diet, and weight control.  Please keep your appointments with your specialists as you may have planned  Please go to the XRAY Department in the first floor for the x-ray testing  Please go to the LAB at the blood drawing area for the tests to be done  You will be contacted by phone if any changes need to be made immediately.  Otherwise, you will receive a letter about your results with an explanation, but please check with MyChart first.  Please remember to sign up for MyChart if you have not done so, as this will be important to you in the future with finding out test results, communicating by private email, and scheduling acute appointments online when needed.  Please make an Appointment to return in 6 months, or sooner if needed

## 2020-01-04 NOTE — Progress Notes (Signed)
Subjective:    Patient ID: Alexandra Wolfe, female    DOB: March 13, 1946, 74 y.o.   MRN: [id number]  HPI  Here to f/u; overall doing ok,  Pt denies chest pain, increasing sob or doe, wheezing, orthopnea, PND, increased LE swelling, palpitations, dizziness or syncope.  Pt denies new neurological symptoms such as new headache, or facial or extremity weakness or numbness.  Pt denies polydipsia, polyuria, or low sugar episode.  Pt states overall good compliance with meds, mostly trying to follow appropriate diet, with wt overall stable,  but little exercise however.  Non compliant with CPAP as it "bothers me".  Has chronic DOE ? Due to overweight.  Does treadmill 30 min per day (but only 7 min at a time at 2 mph)   Does have several wks ongoing nasal allergy symptoms with clearish congestion, itch and sneezing, without fever, pain, ST, cough, swelling or wheezing. Past Medical History:  Diagnosis Date  . ALLERGIC RHINITIS 04/02/2009   no per pt  . Anxiety   . BACK PAIN 09/27/2008  . BUNIONS, BILATERAL 12/23/2007  . CHEST DISCOMFORT, ATYPICAL 11/07/2009  . Chronic LBP   . COLONIC POLYPS, HX OF 09/13/2007  . CONSTIPATION 09/27/2008  . DEPRESSION 09/13/2007  . Diabetes mellitus    diet controlled  . DYSPNEA ON EXERTION 02/06/2010  . Eustachian tube dysfunction 05/20/2011  . FOOT PAIN, RIGHT 09/27/2008  . HYPERLIPIDEMIA 03/11/2008  . LBP (low back pain) 05/20/2011  . Leukocytosis 11/20/2011  . OSTEOARTHROSIS NOS, LOWER LEG 09/13/2007  . SHOULDER PAIN, LEFT 02/14/2009  . SVT (supraventricular tachycardia) ([place])   . SYMPTOM, PALPITATIONS 09/13/2007  . URINARY INCONTINENCE 08/23/2009  . Vertigo 05/20/2011   Past Surgical History:  Procedure Laterality Date  . BUNIONECTOMY     right  . ccx    . CHOLECYSTECTOMY    . LUMBAR LAMINECTOMY    . LUMBAR LAMINECTOMY    . SHOULDER ARTHROSCOPY  12/13/2011   Procedure: ARTHROSCOPY SHOULDER;  Surgeon: Augustin Schooling;  Location: [facility];  Service: Orthopedics;   Laterality: Left;  Left Shoulder ArthroscopyDebridement Limited Tenodesis Open Rotator Cuff Repair Spur Removal Right Shoulder Injection     reports that she has quit smoking. She has a 10.00 pack-year smoking history. She has never used smokeless tobacco. She reports that she does not drink alcohol or use drugs. family history includes Colon cancer in her mother; Diabetes in an other family member; Diabetes type II in her father; Heart disease in her brother and father. Allergies  Allergen Reactions  . Aripiprazole     REACTION: agitation  . Simvastatin     REACTION: myalgia   Current Outpatient Medications on File Prior to Visit  Medication Sig Dispense Refill  . aspirin 81 MG tablet Take 81 mg by mouth daily.      [name] atorvastatin (LIPITOR) 80 MG tablet Take 1 tablet (80 mg total) by mouth daily. 90 tablet 3  . budesonide-formoterol (SYMBICORT) 160-4.5 MCG/ACT inhaler Inhale 2 puffs into the lungs 2 (two) times daily. 1 Inhaler 12  . diclofenac (VOLTAREN) 75 MG EC tablet Take 75 mg by mouth 2 (two) times daily.    . empagliflozin (JARDIANCE) 25 MG TABS tablet Take 25 mg by mouth daily. 90 tablet 3  . fluticasone (FLONASE) 50 MCG/ACT nasal spray SHAKE LIQUID AND USE 2 SPRAYS IN EACH NOSTRIL DAILY FOR ALLERGIES 16 g 5  . glipiZIDE (GLUCOTROL XL) 2.5 MG 24 hr tablet Take 1 tablet (2.5 mg total) by mouth  daily with breakfast. 90 tablet 3  . hydrochlorothiazide (HYDRODIURIL) 25 MG tablet TAKE 1/2 TABLET(12.5 MG) BY MOUTH DAILY 45 tablet 2  . levothyroxine (SYNTHROID) 50 MCG tablet TAKE 1 TABLET(50 MCG) BY MOUTH DAILY 90 tablet 1  . meclizine (ANTIVERT) 25 MG tablet Take 1 tablet (25 mg total) by mouth 3 (three) times daily as needed for dizziness. 50 tablet 1  . metFORMIN (GLUCOPHAGE-XR) 500 MG 24 hr tablet TAKE 4 TABLETS BY MOUTH EVERY MORNING 360 tablet 1  . metoprolol succinate (TOPROL-XL) 25 MG 24 hr tablet TAKE 1 TABLET(25 MG) BY MOUTH DAILY 90 tablet 1  . ondansetron (ZOFRAN ODT) 8 MG  disintegrating tablet Take 1 tablet (8 mg total) by mouth every 8 (eight) hours as needed for nausea or vomiting. 40 tablet 0  . traZODone (DESYREL) 50 MG tablet TAKE 1 TABLET(50 MG) BY MOUTH AT BEDTIME 30 tablet 5  . Vitamin D, Ergocalciferol, (DRISDOL) 1.25 MG (50000 UT) CAPS capsule Take 1 capsule (50,000 Units total) by mouth every 7 (seven) days. 12 capsule 0  . Blood Glucose Monitoring Suppl (ONETOUCH VERIO) w/Device KIT Use as directed twice daily E11.9 (Patient not taking: Reported on 01/04/2020) 1 kit 0  . glucose blood (ONETOUCH VERIO) test strip Use as instructed twice per day E11.9 (Patient not taking: Reported on 01/04/2020) 200 each 12  . Lancets MISC Use as directed twice per day E11.9 (Patient not taking: Reported on 01/04/2020) 200 each 3  . levofloxacin (LEVAQUIN) 500 MG tablet levofloxacin 500 mg tablet    . MYRBETRIQ 50 MG TB24 tablet Take 50 mg by mouth daily.    [name] oxybutynin (DITROPAN XL) 10 MG 24 hr tablet Take 1 tablet (10 mg total) by mouth at bedtime. (Patient not taking: Reported on 01/04/2020) 90 tablet 3   No current facility-administered medications on file prior to visit.   Review of Systems All otherwise neg per pt     Objective:   Physical Exam BP 132/76 (BP Location: Left Arm, Patient Position: Sitting, Cuff Size: Normal)   Pulse 77   Temp 98.5 F (36.9 C) (Oral)   Ht 5' 5"  (1.651 m)   SpO2 97%   BMI 40.27 kg/m  VS noted, dyspneic with getting up on exam table, obese Constitutional: Pt appears in NAD HENT: Head: NCAT.  Right Ear: External ear normal.  Left Ear: External ear normal.  Eyes: . Pupils are equal, round, and reactive to light. Conjunctivae and EOM are normal Nose: without d/c or deformity Neck: Neck supple. Gross normal ROM Cardiovascular: Normal rate and regular rhythm.   Pulmonary/Chest: Effort normal and breath sounds without rales or wheezing.  Abd:  Soft, NT, ND, + BS, no organomegaly Neurological: Pt is alert. At baseline  orientation, motor grossly intact Skin: Skin is warm. No rashes, other new lesions, no LE edema Psychiatric: Pt behavior is normal without agitation  All otherwise neg per pt Lab Results  Component Value Date   WBC 14.5 (H) 07/01/2019   HGB 15.5 (H) 07/01/2019   HCT 46.6 (H) 07/01/2019   PLT 404.0 (H) 07/01/2019   GLUCOSE 128 (H) 07/01/2019   CHOL 169 07/01/2019   TRIG 354.0 (H) 07/01/2019   HDL 39.00 (L) 07/01/2019   LDLDIRECT 96.0 07/01/2019   LDLCALC 66 03/04/2018   ALT 25 07/01/2019   AST 23 07/01/2019   NA 138 07/01/2019   K 4.1 07/01/2019   CL 100 07/01/2019   CREATININE 0.84 07/01/2019   BUN 21 07/01/2019   CO2  23 07/01/2019   TSH 3.40 07/01/2019   INR 1.0 ratio 11/13/2009   HGBA1C 7.7 (H) 07/01/2019   MICROALBUR <0.7 07/01/2019      Assessment & Plan:

## 2020-01-05 ENCOUNTER — Encounter: Payer: Self-pay | Admitting: Internal Medicine

## 2020-01-07 ENCOUNTER — Ambulatory Visit (INDEPENDENT_AMBULATORY_CARE_PROVIDER_SITE_OTHER): Payer: Medicare Other | Admitting: *Deleted

## 2020-01-07 ENCOUNTER — Other Ambulatory Visit: Payer: Self-pay

## 2020-01-07 DIAGNOSIS — Z23 Encounter for immunization: Secondary | ICD-10-CM | POA: Diagnosis not present

## 2020-01-31 DIAGNOSIS — E782 Mixed hyperlipidemia: Secondary | ICD-10-CM | POA: Diagnosis not present

## 2020-01-31 DIAGNOSIS — N951 Menopausal and female climacteric states: Secondary | ICD-10-CM | POA: Diagnosis not present

## 2020-01-31 DIAGNOSIS — E119 Type 2 diabetes mellitus without complications: Secondary | ICD-10-CM | POA: Diagnosis not present

## 2020-01-31 DIAGNOSIS — R635 Abnormal weight gain: Secondary | ICD-10-CM | POA: Diagnosis not present

## 2020-02-02 ENCOUNTER — Telehealth: Payer: Self-pay | Admitting: Internal Medicine

## 2020-02-02 MED ORDER — NEOMYCIN-POLYMYXIN-HC 1 % OT SOLN: 3.0000 [drp] | Freq: Four times a day (QID) | OTIC | 0 refills | Status: AC

## 2020-02-02 NOTE — Telephone Encounter (Signed)
New Message:  Pt is calling and states that Dr. Jenny Reichmann prescribed her some ear medication about 6 months ago and she can not remember the name of the medication but she is now having L ear aches again and would like to know if he can prescribe her some more. Please advise.

## 2020-02-02 NOTE — Telephone Encounter (Signed)
Called and informed patient of below message and prescription has been sent to pharmacy

## 2020-02-02 NOTE — Telephone Encounter (Signed)
Pt had 10 days courses levaquin in aug 2020 and jan 2021, so should not need further oral antibx  Ok for topical antibx - done erx

## 2020-02-08 DIAGNOSIS — R5382 Chronic fatigue, unspecified: Secondary | ICD-10-CM | POA: Diagnosis not present

## 2020-02-08 DIAGNOSIS — N951 Menopausal and female climacteric states: Secondary | ICD-10-CM | POA: Diagnosis not present

## 2020-02-08 DIAGNOSIS — Z1339 Encounter for screening examination for other mental health and behavioral disorders: Secondary | ICD-10-CM | POA: Diagnosis not present

## 2020-02-08 DIAGNOSIS — Z1331 Encounter for screening for depression: Secondary | ICD-10-CM | POA: Diagnosis not present

## 2020-02-17 DIAGNOSIS — E119 Type 2 diabetes mellitus without complications: Secondary | ICD-10-CM | POA: Diagnosis not present

## 2020-02-17 DIAGNOSIS — Z6841 Body Mass Index (BMI) 40.0 and over, adult: Secondary | ICD-10-CM | POA: Diagnosis not present

## 2020-02-24 DIAGNOSIS — Z6839 Body mass index (BMI) 39.0-39.9, adult: Secondary | ICD-10-CM | POA: Diagnosis not present

## 2020-02-24 DIAGNOSIS — E039 Hypothyroidism, unspecified: Secondary | ICD-10-CM | POA: Diagnosis not present

## 2020-02-28 ENCOUNTER — Other Ambulatory Visit: Payer: Self-pay | Admitting: Internal Medicine

## 2020-02-29 ENCOUNTER — Observation Stay (HOSPITAL_COMMUNITY)
Admission: EM | Admit: 2020-02-29 | Discharge: 2020-03-02 | Disposition: A | Discharge status: Home / Self Care | Payer: Medicare Other | Attending: Internal Medicine | Admitting: Internal Medicine

## 2020-02-29 ENCOUNTER — Encounter (HOSPITAL_COMMUNITY): Payer: Self-pay | Admitting: Emergency Medicine

## 2020-02-29 ENCOUNTER — Emergency Department (HOSPITAL_COMMUNITY): Payer: Medicare Other

## 2020-02-29 ENCOUNTER — Other Ambulatory Visit: Payer: Self-pay

## 2020-02-29 ENCOUNTER — Observation Stay (HOSPITAL_COMMUNITY): Payer: Medicare Other

## 2020-02-29 DIAGNOSIS — E669 Obesity, unspecified: Secondary | ICD-10-CM | POA: Insufficient documentation

## 2020-02-29 DIAGNOSIS — N289 Disorder of kidney and ureter, unspecified: Secondary | ICD-10-CM

## 2020-02-29 DIAGNOSIS — J45909 Unspecified asthma, uncomplicated: Secondary | ICD-10-CM | POA: Diagnosis present

## 2020-02-29 DIAGNOSIS — M19011 Primary osteoarthritis, right shoulder: Secondary | ICD-10-CM | POA: Diagnosis not present

## 2020-02-29 DIAGNOSIS — W182XXA Fall in (into) shower or empty bathtub, initial encounter: Secondary | ICD-10-CM | POA: Diagnosis not present

## 2020-02-29 DIAGNOSIS — R9431 Abnormal electrocardiogram [ECG] [EKG]: Secondary | ICD-10-CM | POA: Diagnosis not present

## 2020-02-29 DIAGNOSIS — R1111 Vomiting without nausea: Secondary | ICD-10-CM | POA: Diagnosis not present

## 2020-02-29 DIAGNOSIS — W19XXXA Unspecified fall, initial encounter: Secondary | ICD-10-CM | POA: Diagnosis not present

## 2020-02-29 DIAGNOSIS — Z87891 Personal history of nicotine dependence: Secondary | ICD-10-CM | POA: Insufficient documentation

## 2020-02-29 DIAGNOSIS — I10 Essential (primary) hypertension: Secondary | ICD-10-CM | POA: Diagnosis present

## 2020-02-29 DIAGNOSIS — Z7984 Long term (current) use of oral hypoglycemic drugs: Secondary | ICD-10-CM | POA: Insufficient documentation

## 2020-02-29 DIAGNOSIS — Z7951 Long term (current) use of inhaled steroids: Secondary | ICD-10-CM | POA: Diagnosis not present

## 2020-02-29 DIAGNOSIS — Z791 Long term (current) use of non-steroidal anti-inflammatories (NSAID): Secondary | ICD-10-CM | POA: Diagnosis not present

## 2020-02-29 DIAGNOSIS — Z79899 Other long term (current) drug therapy: Secondary | ICD-10-CM | POA: Diagnosis not present

## 2020-02-29 DIAGNOSIS — Z7982 Long term (current) use of aspirin: Secondary | ICD-10-CM | POA: Diagnosis not present

## 2020-02-29 DIAGNOSIS — Z8249 Family history of ischemic heart disease and other diseases of the circulatory system: Secondary | ICD-10-CM | POA: Insufficient documentation

## 2020-02-29 DIAGNOSIS — E119 Type 2 diabetes mellitus without complications: Secondary | ICD-10-CM

## 2020-02-29 DIAGNOSIS — S0101XA Laceration without foreign body of scalp, initial encounter: Secondary | ICD-10-CM | POA: Insufficient documentation

## 2020-02-29 DIAGNOSIS — Z20822 Contact with and (suspected) exposure to covid-19: Secondary | ICD-10-CM | POA: Insufficient documentation

## 2020-02-29 DIAGNOSIS — R519 Headache, unspecified: Secondary | ICD-10-CM | POA: Diagnosis not present

## 2020-02-29 DIAGNOSIS — Z833 Family history of diabetes mellitus: Secondary | ICD-10-CM | POA: Insufficient documentation

## 2020-02-29 DIAGNOSIS — S065X0A Traumatic subdural hemorrhage without loss of consciousness, initial encounter: Secondary | ICD-10-CM | POA: Diagnosis not present

## 2020-02-29 DIAGNOSIS — R55 Syncope and collapse: Secondary | ICD-10-CM | POA: Diagnosis present

## 2020-02-29 DIAGNOSIS — E039 Hypothyroidism, unspecified: Secondary | ICD-10-CM | POA: Diagnosis not present

## 2020-02-29 DIAGNOSIS — S065X9A Traumatic subdural hemorrhage with loss of consciousness of unspecified duration, initial encounter: Secondary | ICD-10-CM | POA: Diagnosis not present

## 2020-02-29 DIAGNOSIS — S065XAA Traumatic subdural hemorrhage with loss of consciousness status unknown, initial encounter: Secondary | ICD-10-CM | POA: Diagnosis present

## 2020-02-29 DIAGNOSIS — R52 Pain, unspecified: Secondary | ICD-10-CM | POA: Diagnosis not present

## 2020-02-29 DIAGNOSIS — S4991XA Unspecified injury of right shoulder and upper arm, initial encounter: Secondary | ICD-10-CM | POA: Diagnosis not present

## 2020-02-29 DIAGNOSIS — G4733 Obstructive sleep apnea (adult) (pediatric): Secondary | ICD-10-CM | POA: Diagnosis not present

## 2020-02-29 DIAGNOSIS — R58 Hemorrhage, not elsewhere classified: Secondary | ICD-10-CM | POA: Diagnosis not present

## 2020-02-29 DIAGNOSIS — F0781 Postconcussional syndrome: Secondary | ICD-10-CM | POA: Diagnosis not present

## 2020-02-29 DIAGNOSIS — M25511 Pain in right shoulder: Secondary | ICD-10-CM | POA: Diagnosis not present

## 2020-02-29 DIAGNOSIS — Z6839 Body mass index (BMI) 39.0-39.9, adult: Secondary | ICD-10-CM | POA: Diagnosis not present

## 2020-02-29 DIAGNOSIS — F329 Major depressive disorder, single episode, unspecified: Secondary | ICD-10-CM | POA: Diagnosis not present

## 2020-02-29 DIAGNOSIS — E785 Hyperlipidemia, unspecified: Secondary | ICD-10-CM | POA: Insufficient documentation

## 2020-02-29 DIAGNOSIS — D72829 Elevated white blood cell count, unspecified: Secondary | ICD-10-CM | POA: Diagnosis not present

## 2020-02-29 DIAGNOSIS — R296 Repeated falls: Secondary | ICD-10-CM

## 2020-02-29 DIAGNOSIS — R42 Dizziness and giddiness: Secondary | ICD-10-CM | POA: Insufficient documentation

## 2020-02-29 LAB — PROTIME-INR
INR: 1 (ref 0.8–1.2)
Prothrombin Time: 13.5 seconds (ref 11.4–15.2)

## 2020-02-29 LAB — CBC
HCT: 45.1 % (ref 36.0–46.0)
Hemoglobin: 14.7 g/dL (ref 12.0–15.0)
MCH: 29.6 pg (ref 26.0–34.0)
MCHC: 32.6 g/dL (ref 30.0–36.0)
MCV: 90.7 fL (ref 80.0–100.0)
Platelets: 368 10*3/uL (ref 150–400)
RBC: 4.97 MIL/uL (ref 3.87–5.11)
RDW: 13.5 % (ref 11.5–15.5)
WBC: 13.4 10*3/uL — ABNORMAL HIGH (ref 4.0–10.5)
nRBC: 0 % (ref 0.0–0.2)

## 2020-02-29 LAB — BASIC METABOLIC PANEL
Anion gap: 18 — ABNORMAL HIGH (ref 5–15)
BUN: 29 mg/dL — ABNORMAL HIGH (ref 8–23)
CO2: 19 mmol/L — ABNORMAL LOW (ref 22–32)
Calcium: 9.6 mg/dL (ref 8.9–10.3)
Chloride: 103 mmol/L (ref 98–111)
Creatinine, Ser: 1.05 mg/dL — ABNORMAL HIGH (ref 0.44–1.00)
GFR calc Af Amer: >60 mL/min (ref >60)
GFR calc non Af Amer: 52 mL/min — ABNORMAL LOW (ref >60)
Glucose, Bld: 140 mg/dL — ABNORMAL HIGH (ref 70–99)
Potassium: 3.7 mmol/L (ref 3.5–5.1)
Sodium: 140 mmol/L (ref 135–145)

## 2020-02-29 LAB — TROPONIN I (HIGH SENSITIVITY)
Troponin I (High Sensitivity): 6 ng/L (ref <18)
Troponin I (High Sensitivity): 6 ng/L (ref <18)

## 2020-02-29 LAB — APTT: aPTT: 27 seconds (ref 24–36)

## 2020-02-29 IMAGING — CT CT HEAD W/O CM
4 series · 17 of 47 positions shown, 19 images · non-contrast
Comparison: None.

CLINICAL DATA: Syncopal episode, hit head

EXAM:
CT HEAD WITHOUT CONTRAST
TECHNIQUE: Contiguous axial images were obtained from the base of the skull
through the vertex without intravenous contrast.

[Series 3: head wo · axial · 0.44mm/px · z∈[+1100,+1240]mm · 7 of 38 slices shown, 9 images]
[im 5/38  brain]
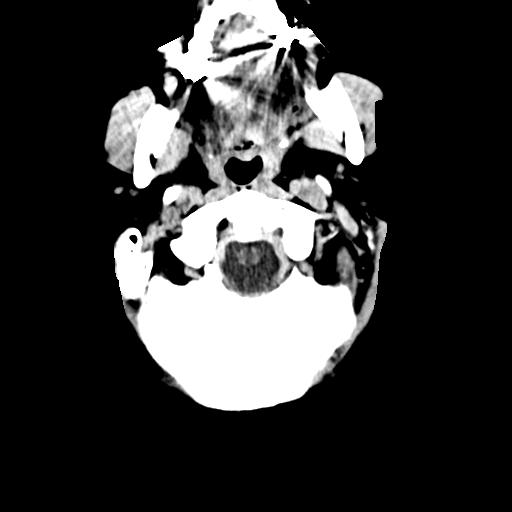
[im 5/38  bone]
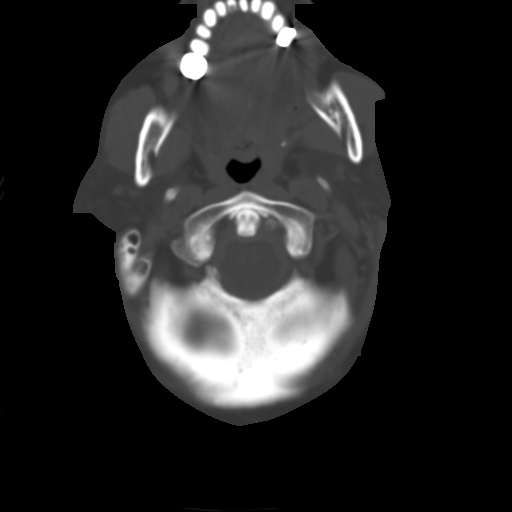
[im 10/38  brain]
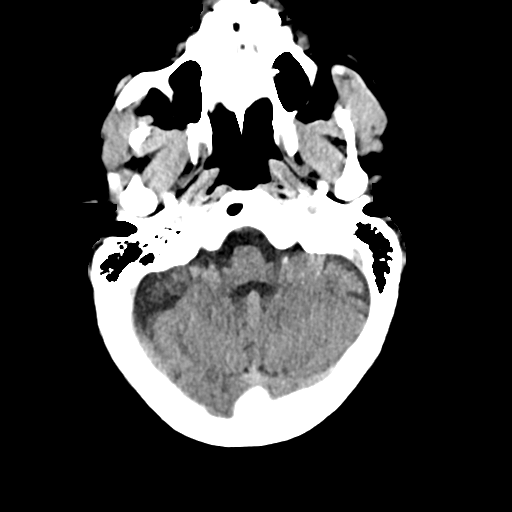
[im 14/38  brain]
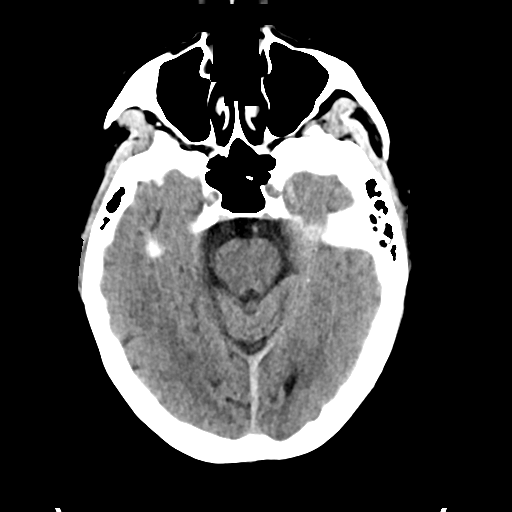
[im 19/38  brain]
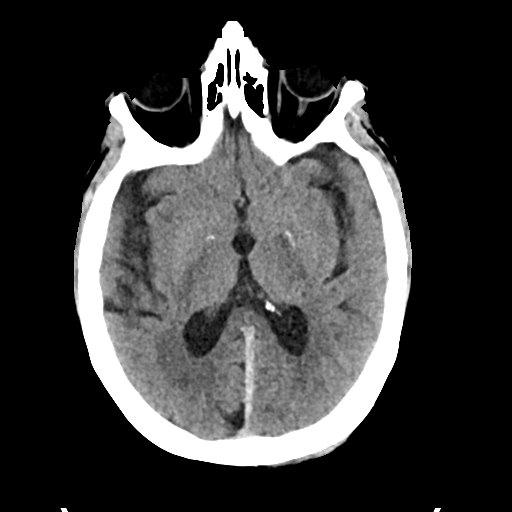
[im 24/38  brain]
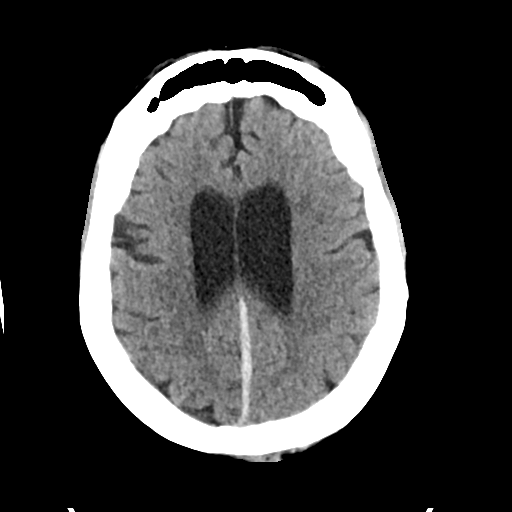
[im 24/38  bone]
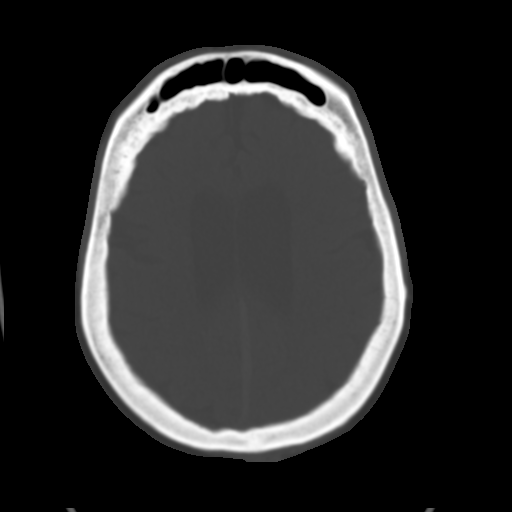
[im 28/38  brain]
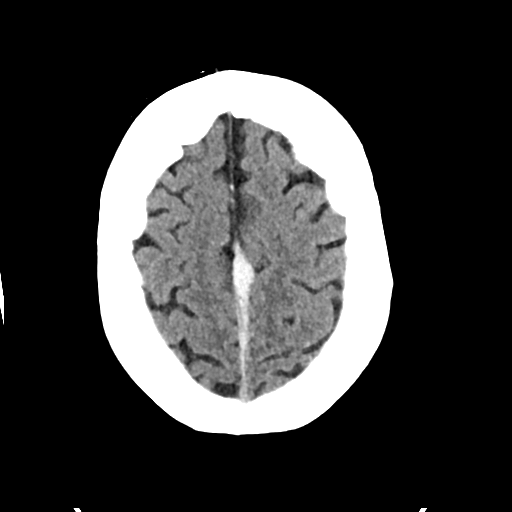
[im 33/38  brain]
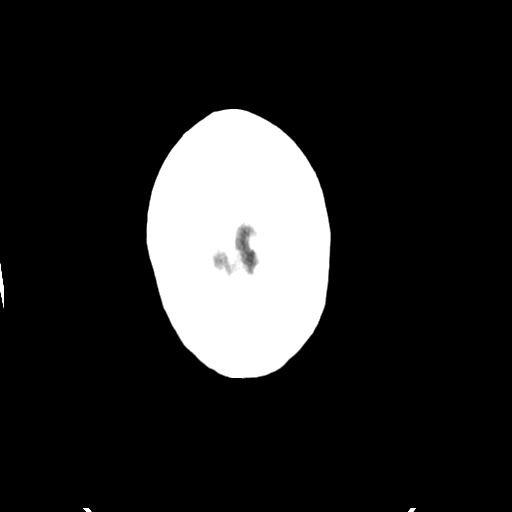

[Series 4: head bone · axial · 0.44mm/px · z∈[+1098,+1164]mm · 4 of 95 slices shown]
[im 10/95  bone]
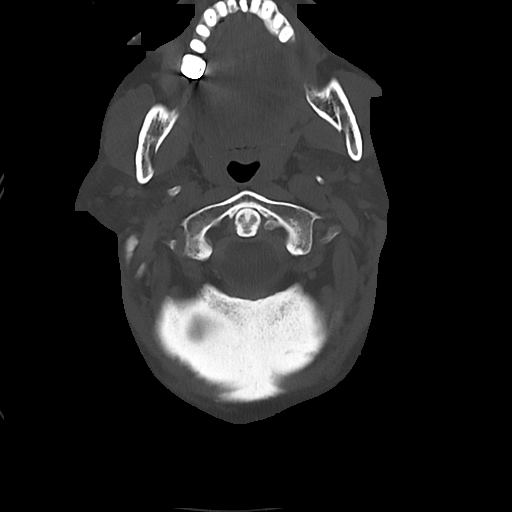
[im 19/95  bone]
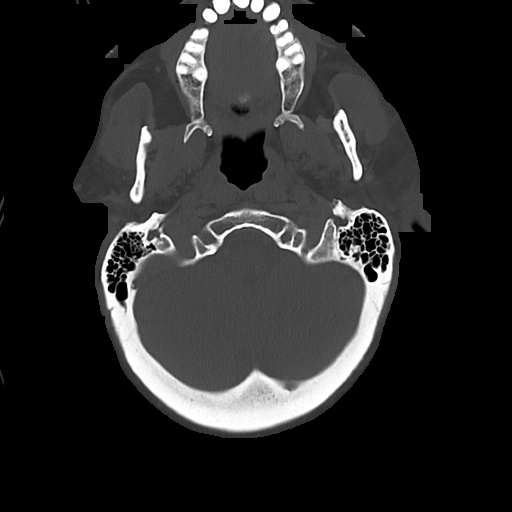
[im 29/95  bone]
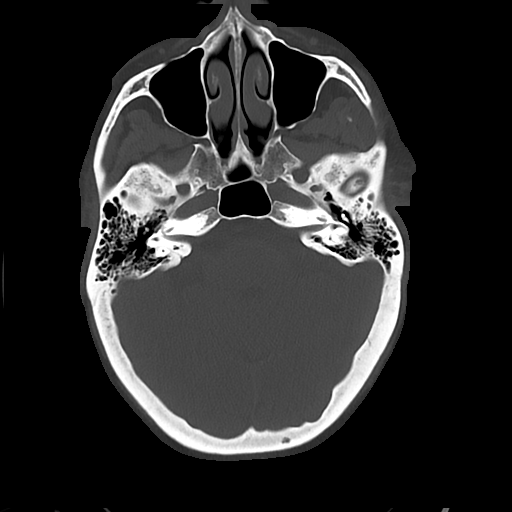
[im 43/95  bone]
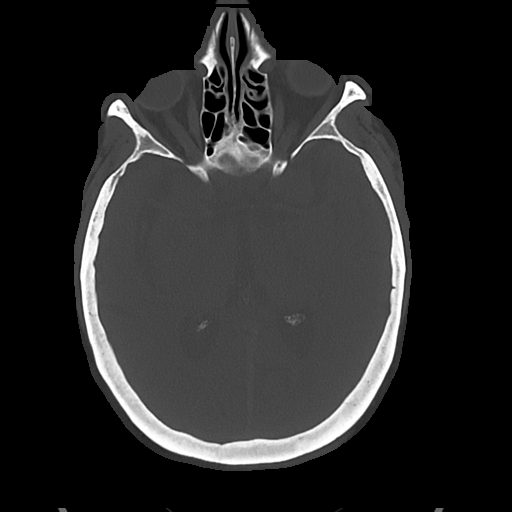

[Series 5: cor soft · coronal · 0.36mm/px · 3 of 74 slices shown]
[im 26/74  brain]
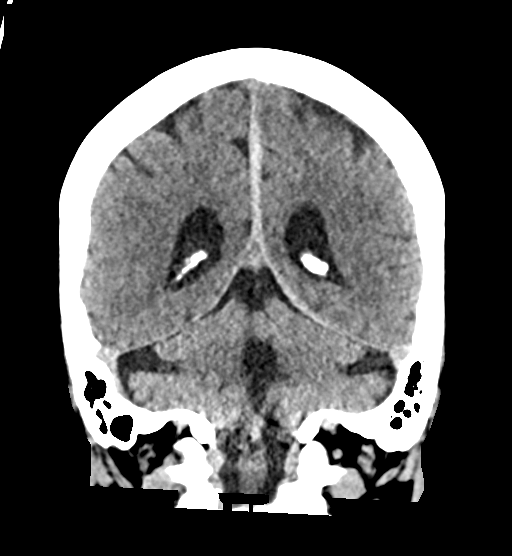
[im 33/74  brain]
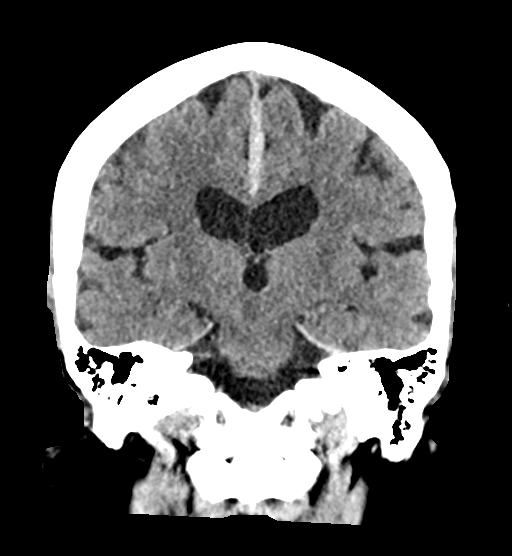
[im 41/74  brain]
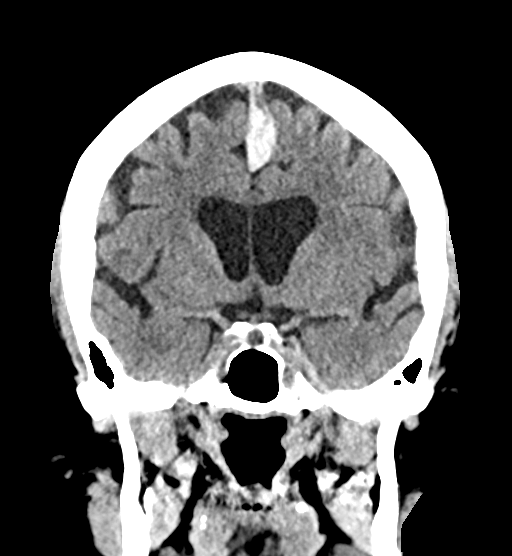

[Series 6: sag soft · sagittal · 0.39mm/px · 3 of 62 slices shown]
[im 21/62  brain]
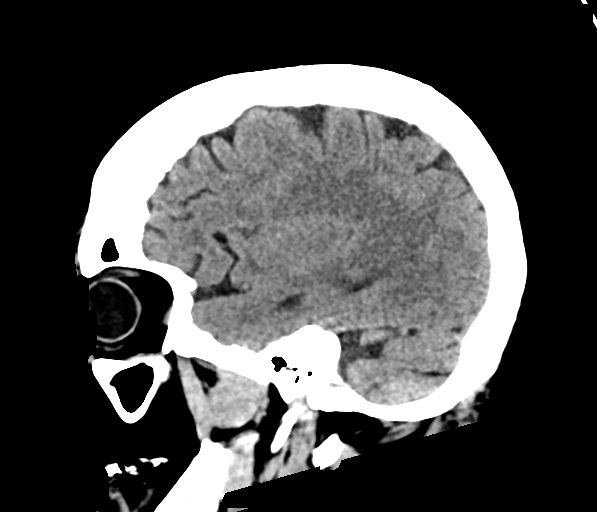
[im 31/62  brain]
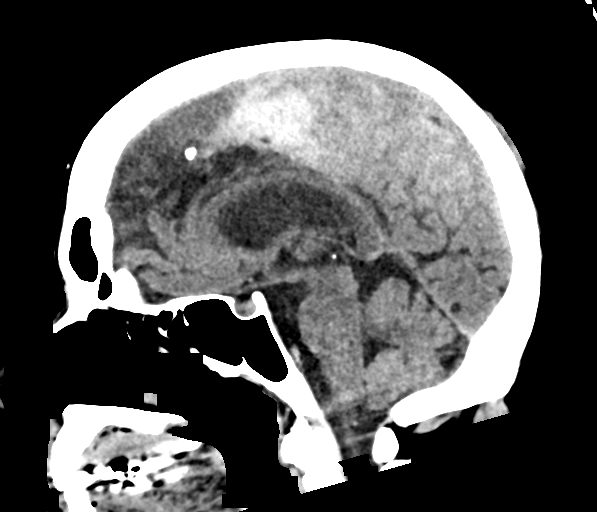
[im 41/62  brain]
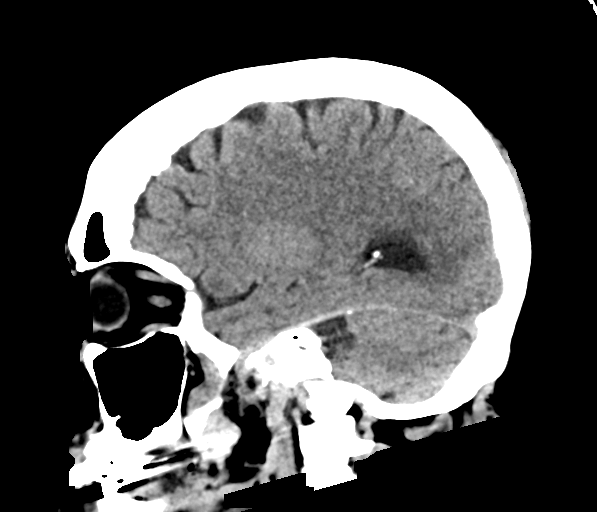

[17 of 47 positions shown; findings below may reference images not displayed]

FINDINGS: Brain: Acute subdural hematoma is present along the left aspect of
the falx measuring up to 10 mm in thickness. There is no significant
associated mass effect.

Prominence of the ventricles and sulci reflects minor generalized
parenchymal volume loss. Gray-white differentiation is preserved.

Vascular: No hyperdense vessel or unexpected calcification.

Skull: Calvarium is unremarkable.

Sinuses/Orbits: No acute finding.

Other: Posterior scalp soft tissue swelling.
IMPRESSION: Acute left falcine subdural hematoma.  No significant mass effect.

These results were called by telephone at the time of interpretation
on [DATE] at [DATE] to provider MINTYS , who verbally
acknowledged these results.

## 2020-02-29 IMAGING — CT CT CERVICAL SPINE W/O CM
3 of 4 series · 12 of 33 positions shown, 14 images · non-contrast
Comparison: None.

CLINICAL DATA: Syncope

EXAM:
CT CERVICAL SPINE WITHOUT CONTRAST
TECHNIQUE: Multidetector CT imaging of the cervical spine was performed without
intravenous contrast. Multiplanar CT image reconstructions were also
generated.

[Series 8: sag bone · sagittal · 0.25mm/px · 5 of 74 slices shown, 6 images]
[im 25/74  bone]
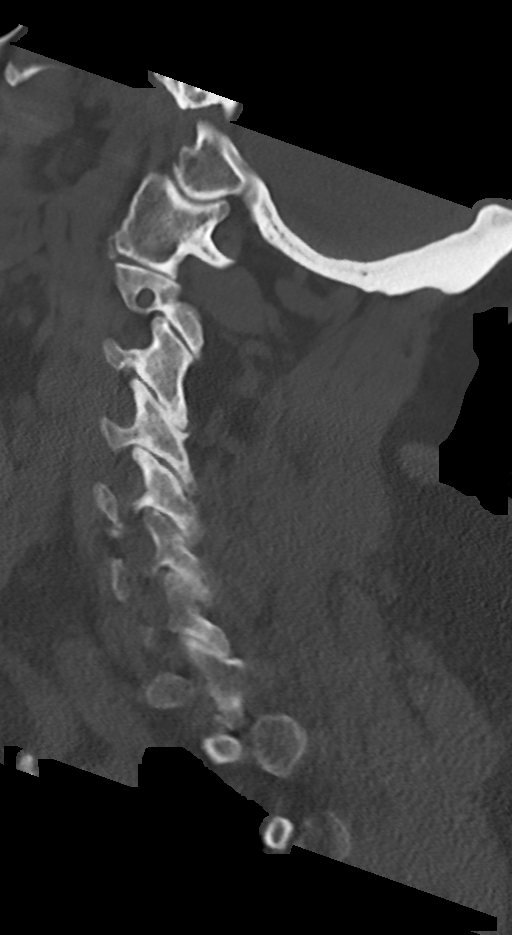
[im 31/74  bone]
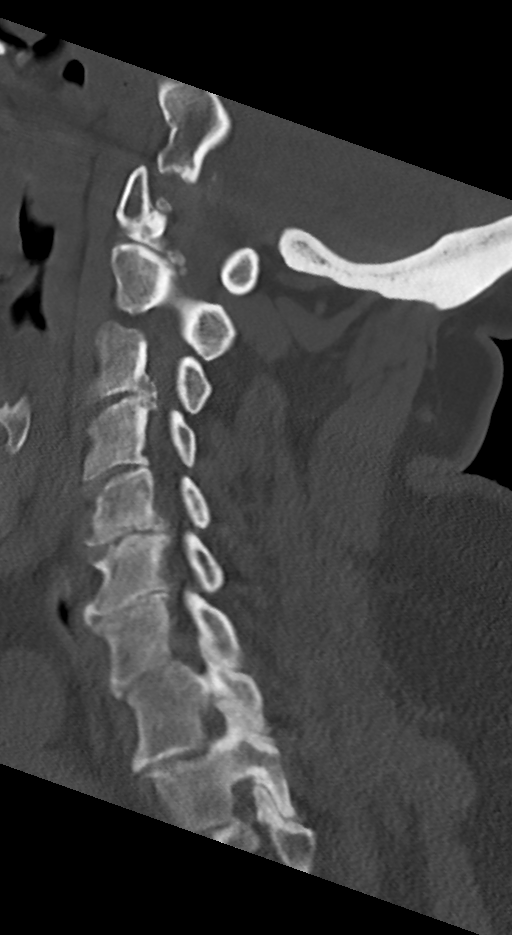
[im 37/74  soft-tissue]
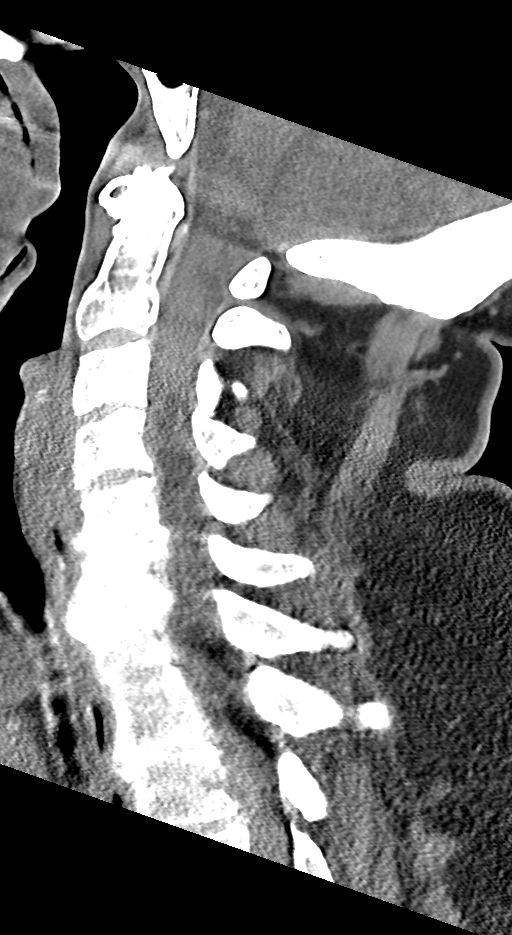
[im 37/74  bone]
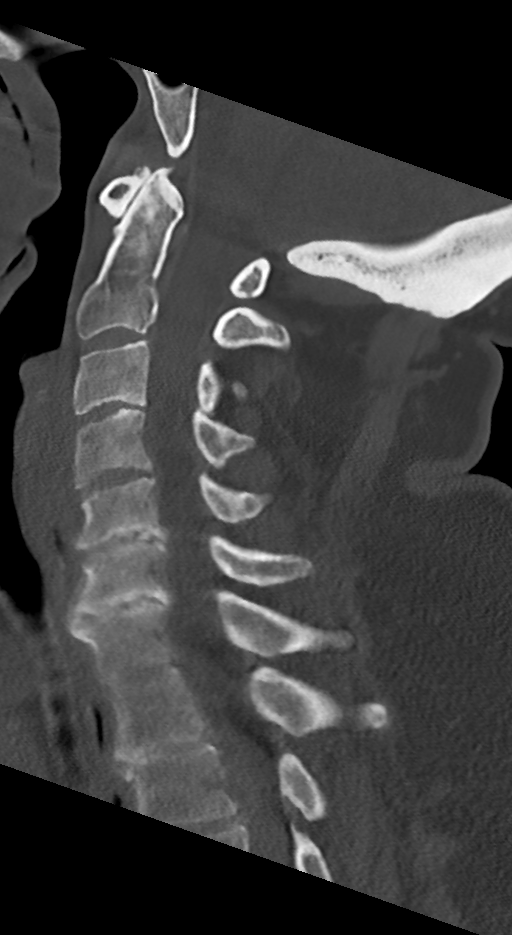
[im 43/74  bone]
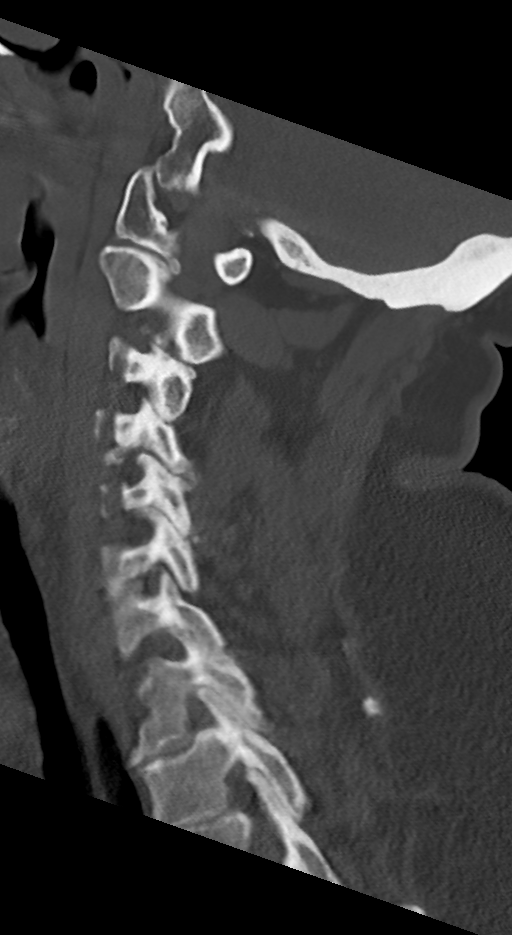
[im 49/74  bone]
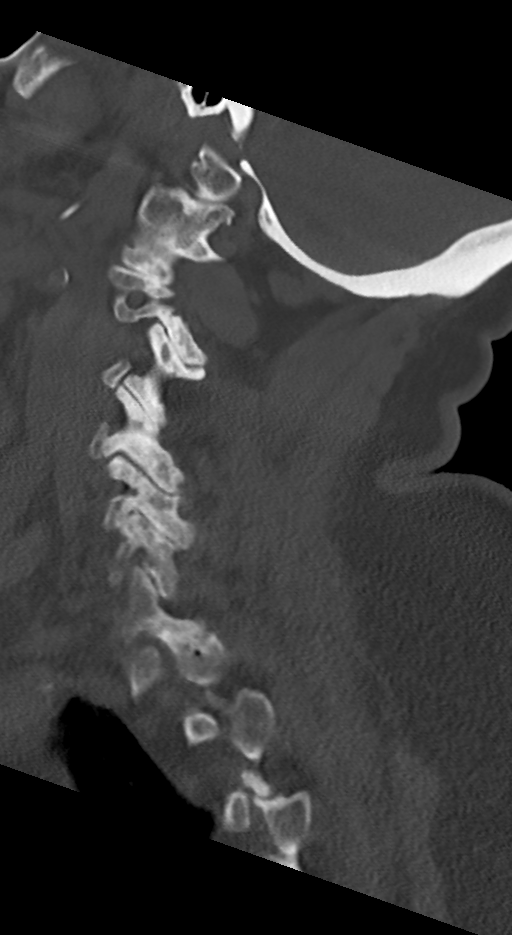

[Series 9: cor bone · coronal · 0.30mm/px · 3 of 70 slices shown]
[im 14/70  bone]
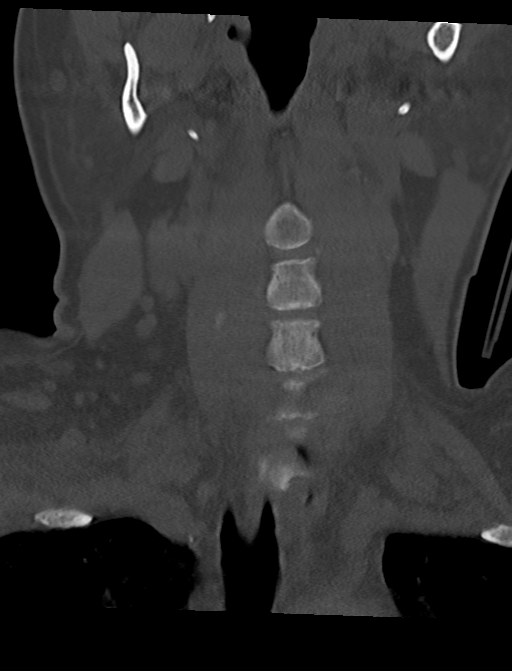
[im 28/70  bone]
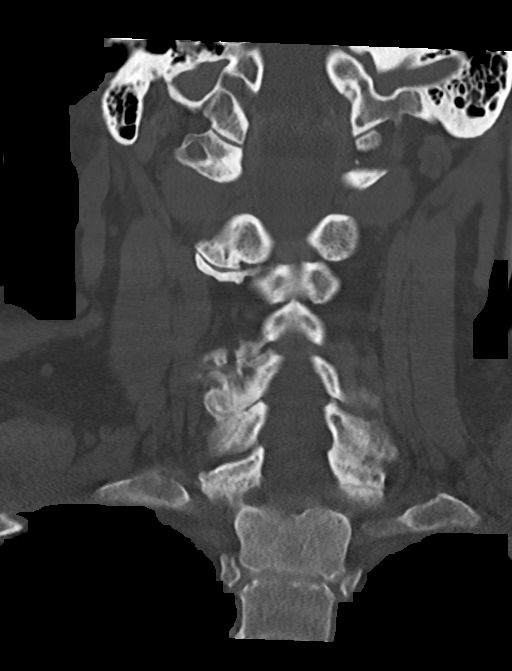
[im 42/70  bone]
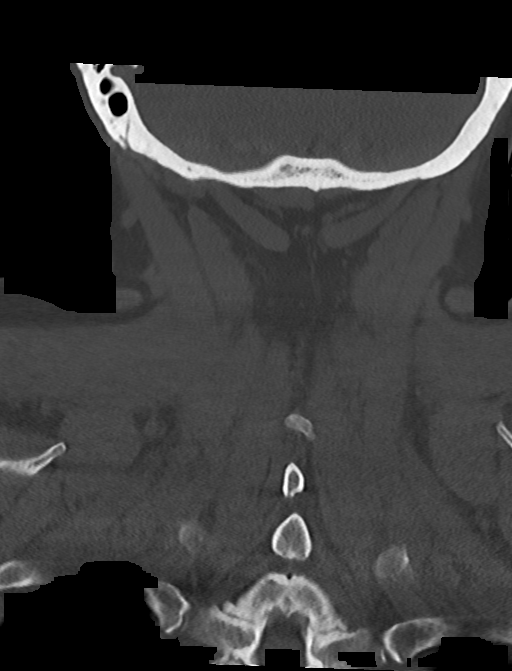

[Series 10: orthogonal axials · axial · 0.21mm/px · z∈[+967,+1075]mm · 4 of 90 slices shown, 5 images]
[im 15/90  soft-tissue]
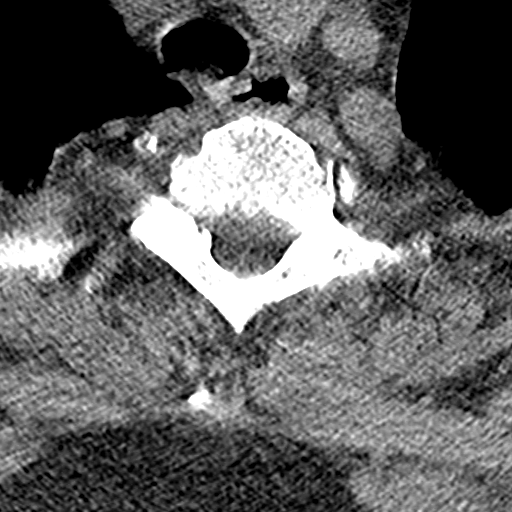
[im 15/90  bone]
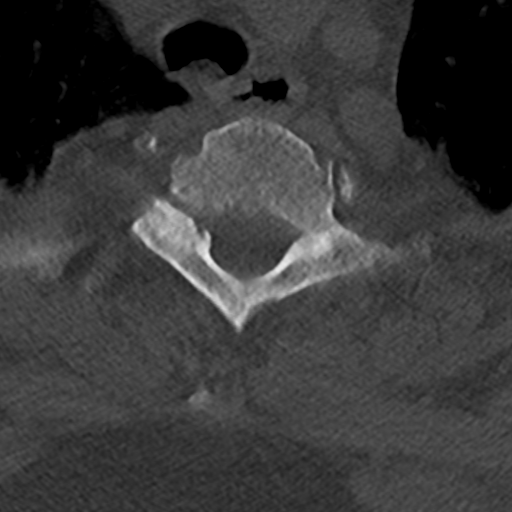
[im 30/90  bone]
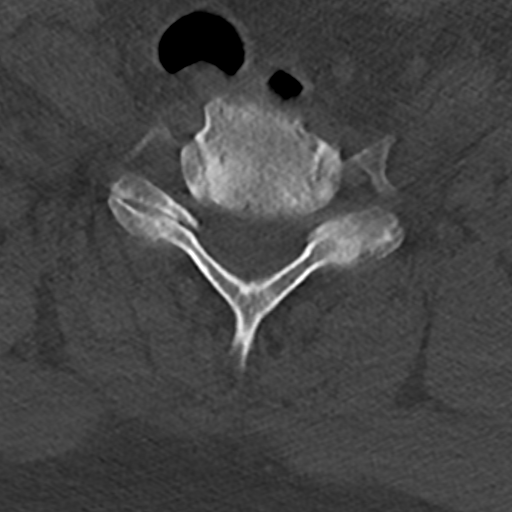
[im 60/90  bone]
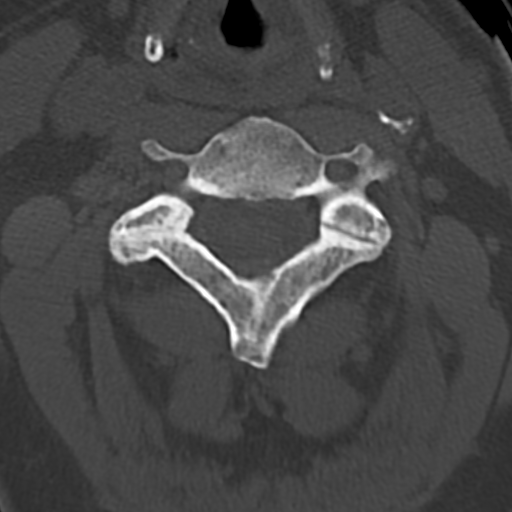
[im 75/90  bone]
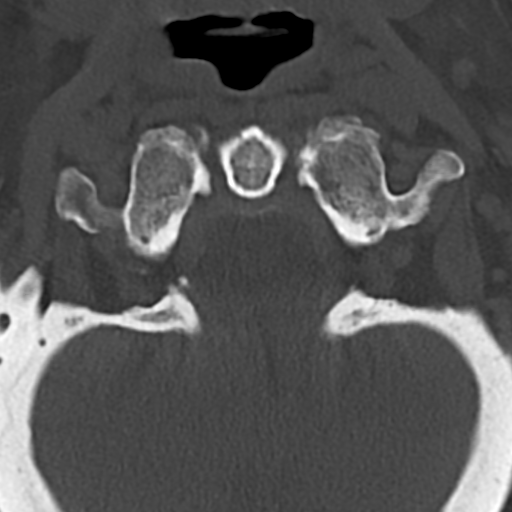

[12 of 33 positions shown; findings below may reference images not displayed]

FINDINGS: Alignment: No significant listhesis.

Skull base and vertebrae: No acute cervical spine fracture.
Vertebral body heights are maintained apart from degenerative
endplate irregularity.

Soft tissues and spinal canal: No prevertebral fluid or swelling. No
visible canal hematoma.

Disc levels: Multilevel degenerative changes are present including
disc space narrowing, endplate osteophytes, and facet and
uncovertebral hypertrophy.

Upper chest: Negative.

Other: Mild calcified plaque at the left ICA origin.
IMPRESSION: No acute cervical spine fracture.

## 2020-02-29 IMAGING — DX DG CHEST 1V PORT
1 series · 1 of 1 positions shown · non-contrast
Comparison: [DATE]

CLINICAL DATA: Syncope.

EXAM:
PORTABLE CHEST 1 VIEW

[chest ap]
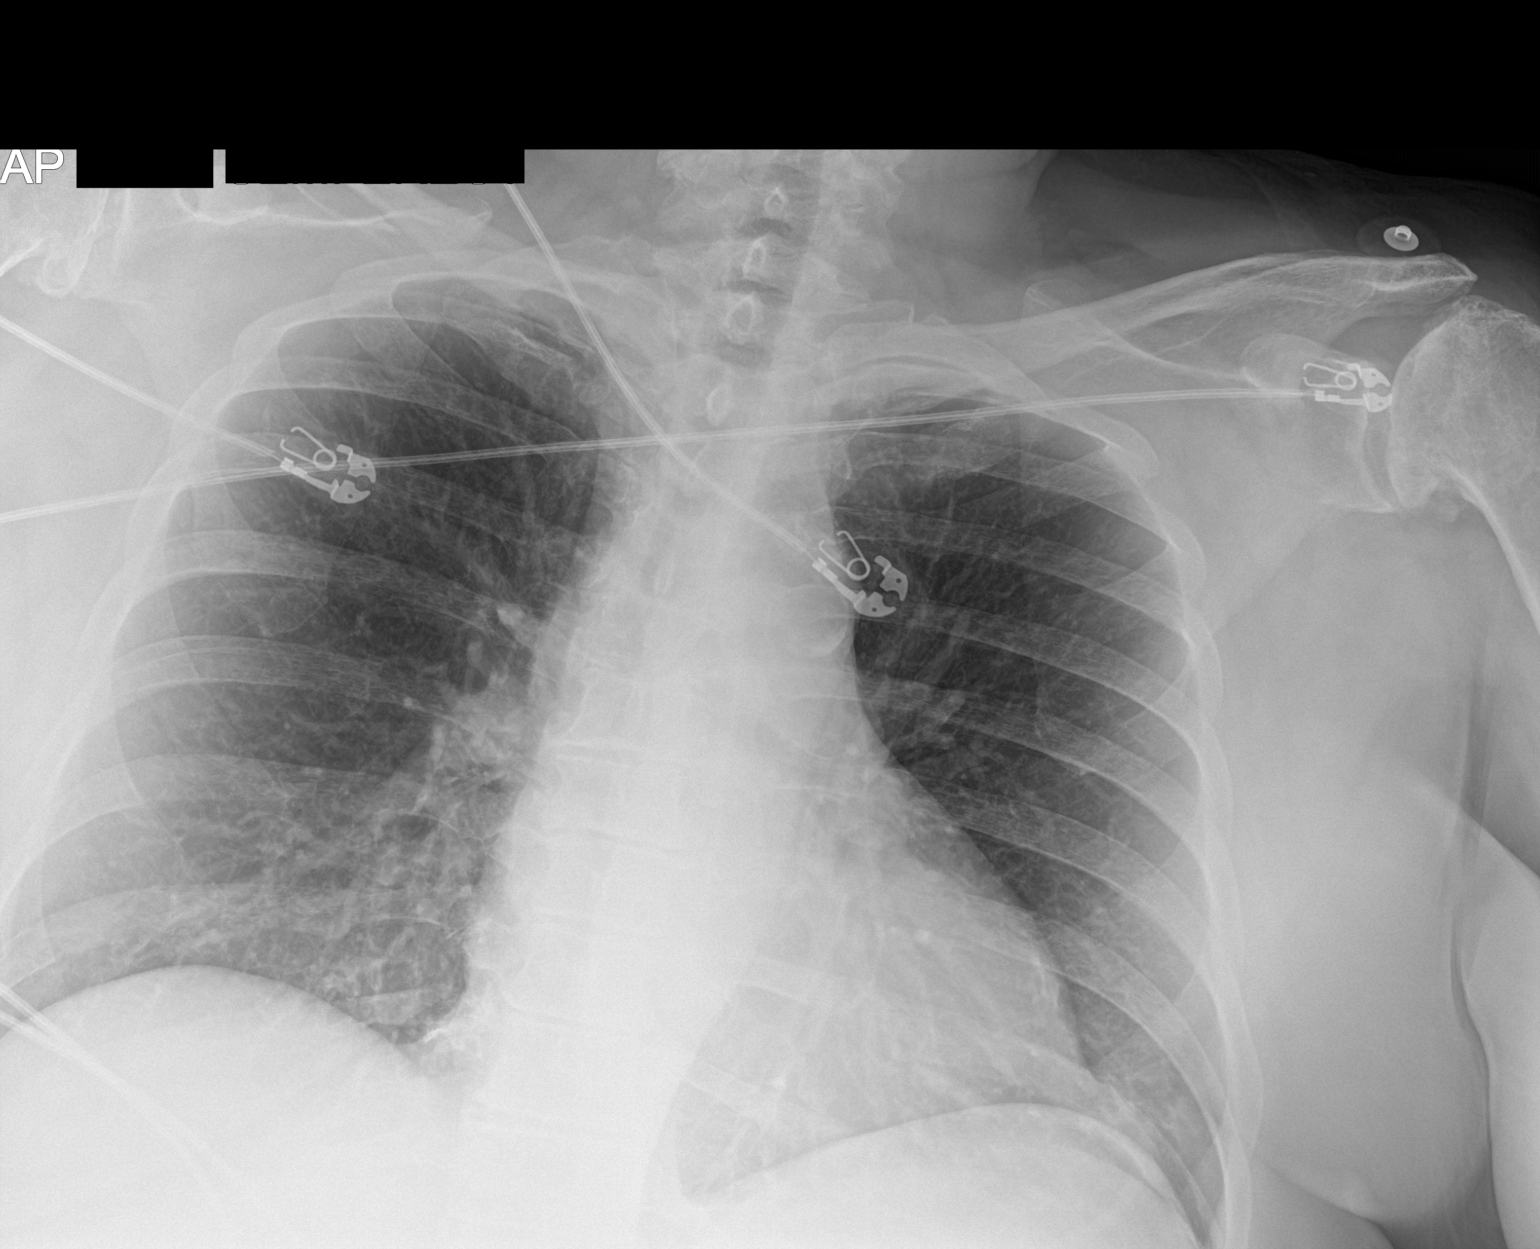

[1 of 1 positions shown; findings below may reference images not displayed]

FINDINGS: The heart size and mediastinal contours are within normal limits.
There is moderate severity calcification of the aortic arch. Both
lungs are clear. Degenerative changes are seen involving both
shoulders. The visualized skeletal structures are otherwise
unremarkable.
IMPRESSION: No active disease.

## 2020-02-29 IMAGING — DX DG SHOULDER 2+V PORT*R*
3 series · 3 of 3 positions shown · non-contrast
Comparison: None.

CLINICAL DATA: Fall in shower. Right shoulder pain.

EXAM:
PORTABLE RIGHT SHOULDER

[shoulder ap (1 of 2)]
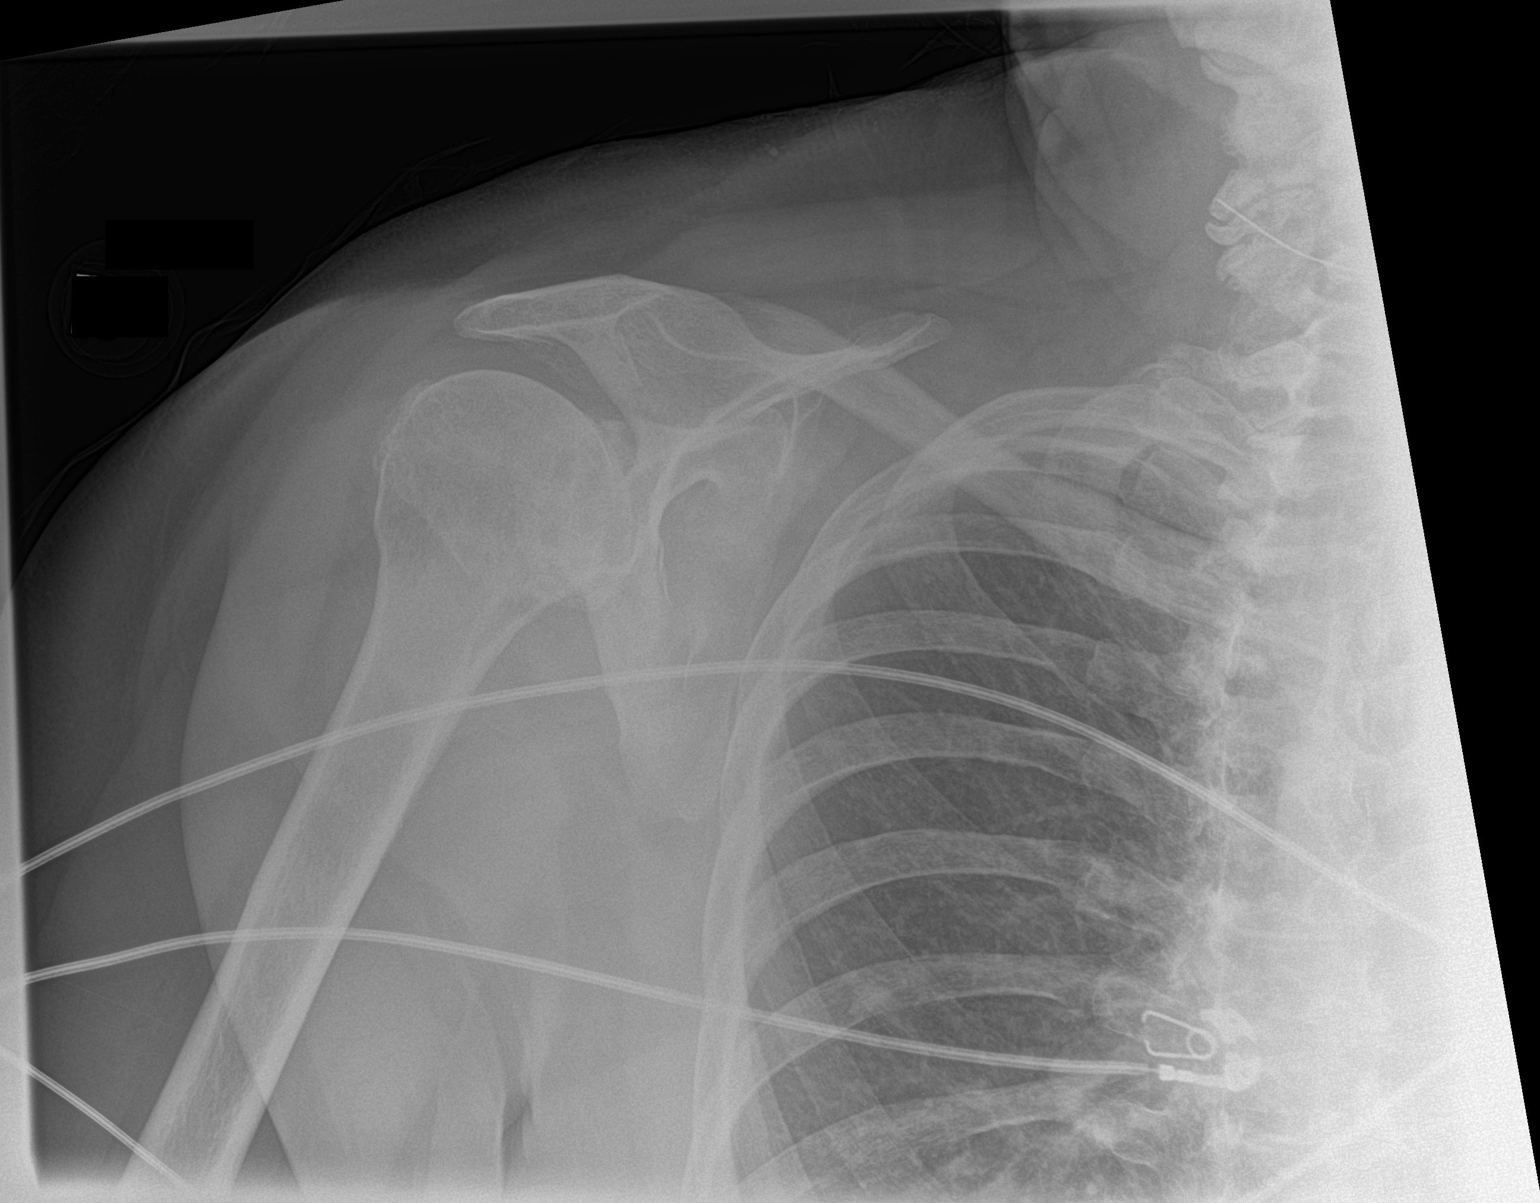

[shoulder ap (2 of 2)]
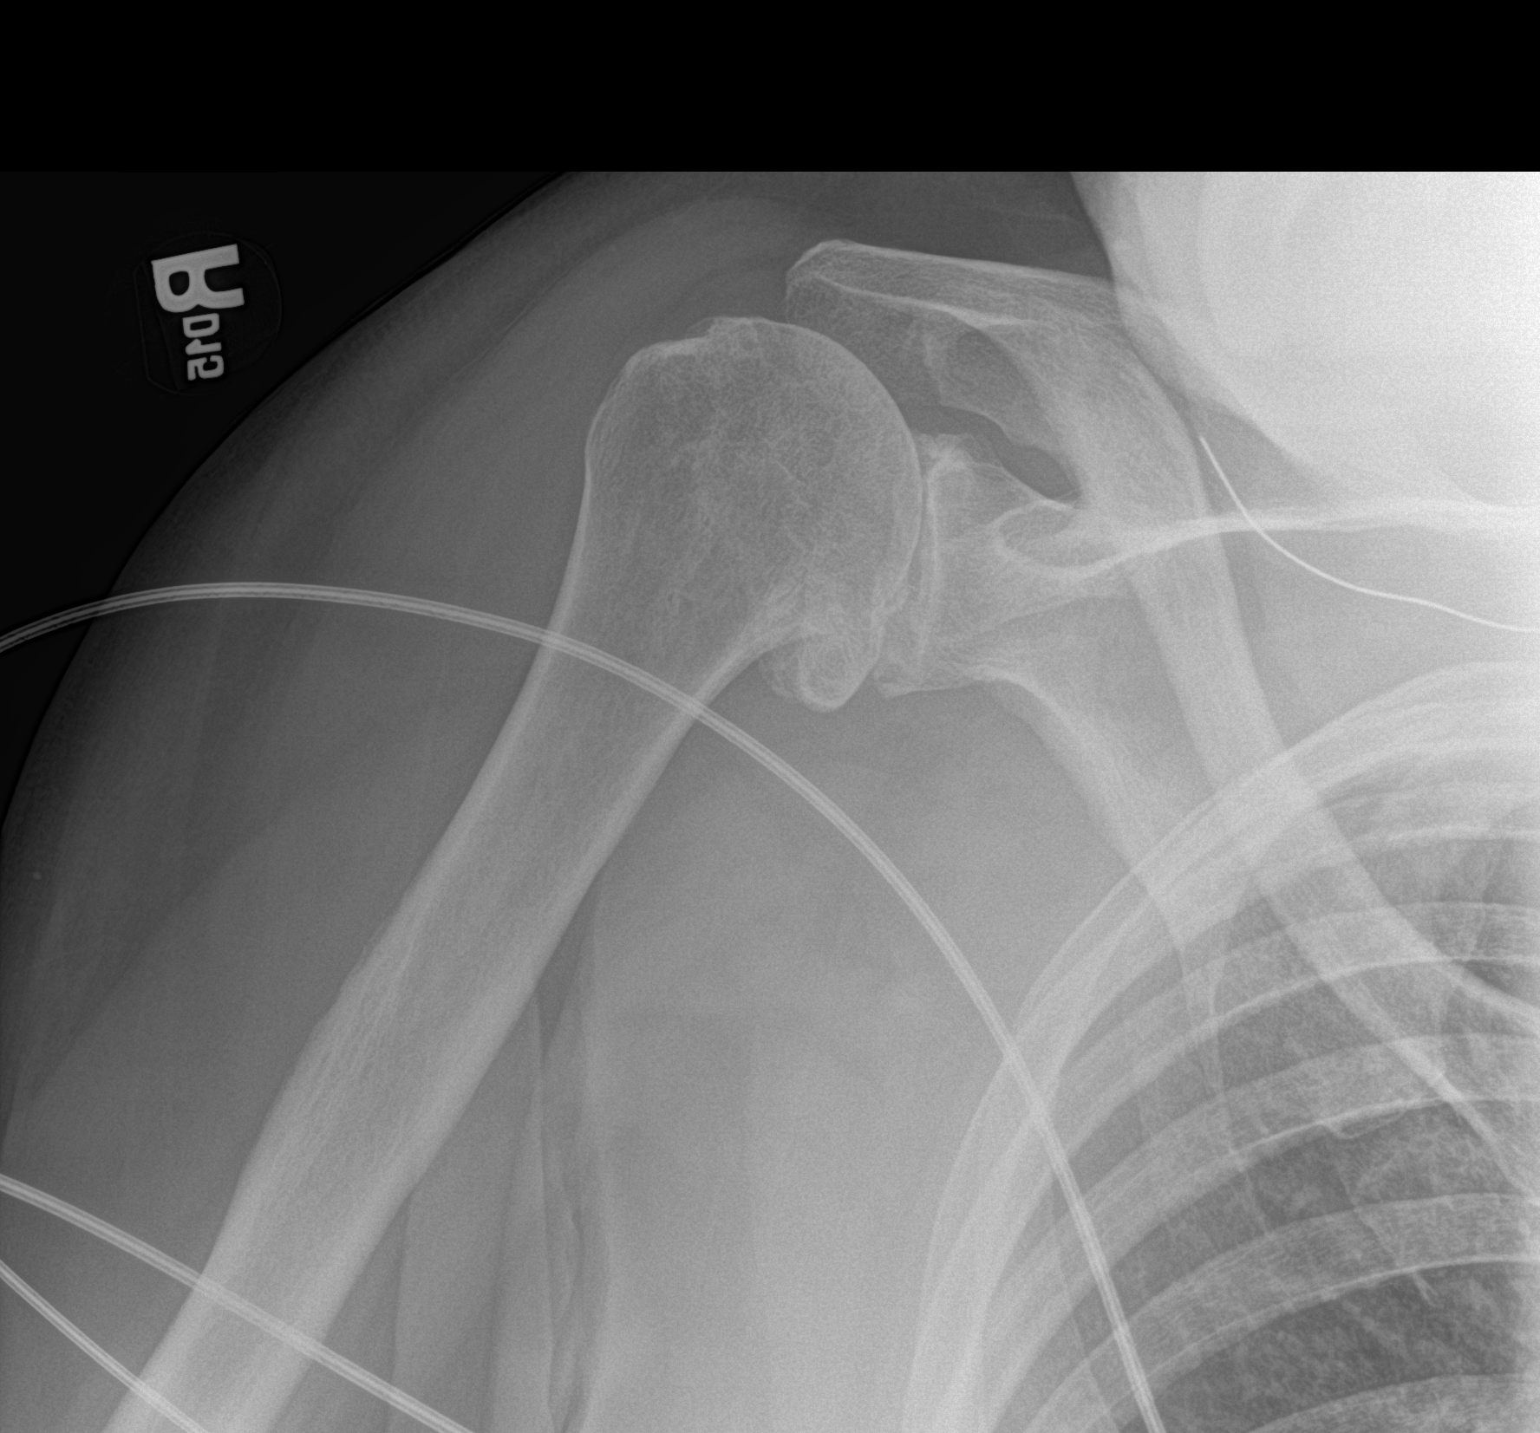

[shoulder swimmer]
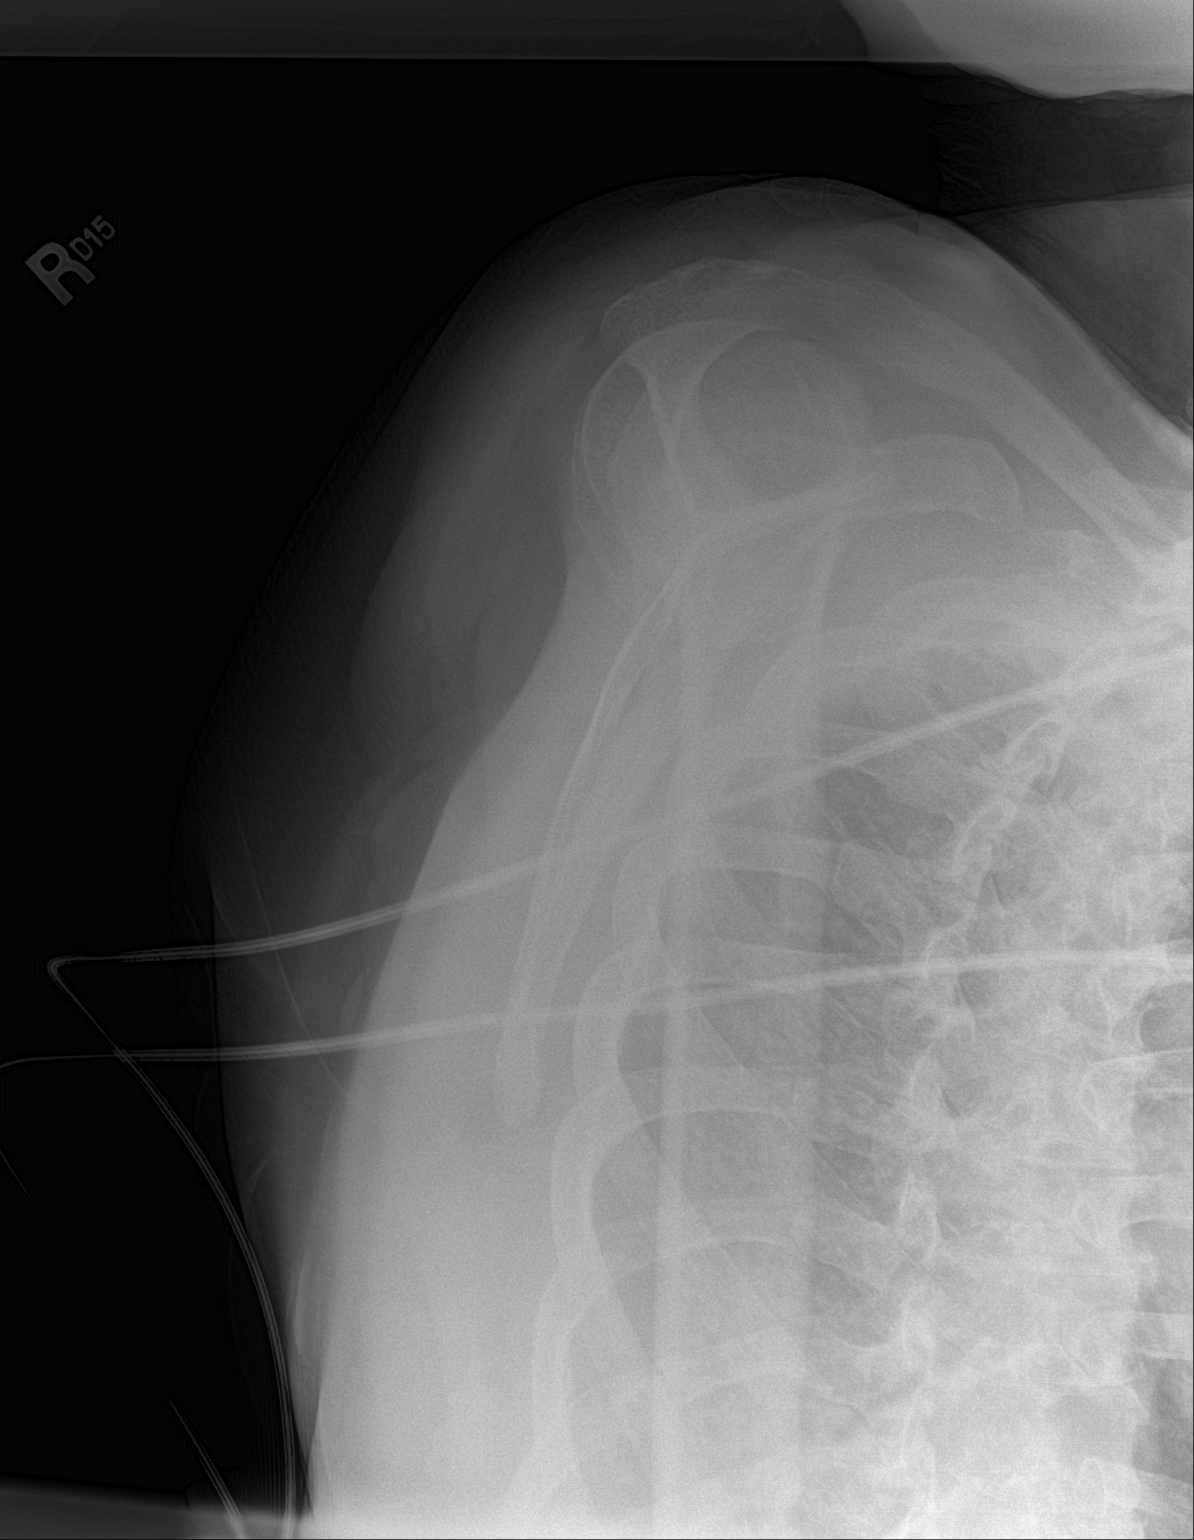

[3 of 3 positions shown; findings below may reference images not displayed]

FINDINGS: Prominent spurring of the humeral head and glenoid with prominent
loss of articular space in the glenohumeral joint compatible with
severe osteoarthritis.

No fracture or malalignment is identified.

Subacromial morphology is type 2 (curved).
IMPRESSION: Severe glenohumeral osteoarthritis. No acute findings.

## 2020-02-29 MED ORDER — LABETALOL HCL 5 MG/ML IV SOLN: 10.0000 mg | INTRAVENOUS | Status: DC | PRN

## 2020-02-29 MED ORDER — INSULIN ASPART 100 UNIT/ML ~~LOC~~ SOLN
0.0000 [IU] | SUBCUTANEOUS | Status: DC
  Administered 2020-03-01 – 2020-03-02 (×3): 1 [IU] via SUBCUTANEOUS

## 2020-02-29 MED ORDER — SODIUM CHLORIDE 0.9 % IV SOLN: INTRAVENOUS | Status: DC

## 2020-02-29 MED ORDER — LEVETIRACETAM IN NACL 500 MG/100ML IV SOLN
500.0000 mg | Freq: Once | INTRAVENOUS | Status: AC
  Administered 2020-02-29: 500 mg via INTRAVENOUS
  Filled 2020-02-29: qty 100

## 2020-02-29 MED ORDER — SODIUM CHLORIDE 0.9% FLUSH
3.0000 mL | Freq: Two times a day (BID) | INTRAVENOUS | Status: DC
  Administered 2020-03-01 – 2020-03-02 (×4): 3 mL via INTRAVENOUS

## 2020-02-29 MED ORDER — MORPHINE SULFATE (PF) 2 MG/ML IV SOLN
1.0000 mg | INTRAVENOUS | Status: DC | PRN
  Administered 2020-02-29: 3 mg via INTRAVENOUS
  Filled 2020-02-29 (×2): qty 2

## 2020-02-29 MED ORDER — HYDROMORPHONE HCL 1 MG/ML IJ SOLN
0.5000 mg | Freq: Once | INTRAMUSCULAR | Status: AC
  Administered 2020-02-29: 0.5 mg via INTRAVENOUS
  Filled 2020-02-29: qty 1

## 2020-02-29 MED ORDER — LEVETIRACETAM IN NACL 500 MG/100ML IV SOLN
500.0000 mg | Freq: Two times a day (BID) | INTRAVENOUS | Status: DC
  Administered 2020-03-01 – 2020-03-02 (×3): 500 mg via INTRAVENOUS
  Filled 2020-02-29 (×4): qty 100

## 2020-02-29 MED ORDER — MOMETASONE FURO-FORMOTEROL FUM 100-5 MCG/ACT IN AERO
2.0000 | INHALATION_SPRAY | Freq: Two times a day (BID) | RESPIRATORY_TRACT | Status: DC
  Administered 2020-02-29 – 2020-03-02 (×4): 2 via RESPIRATORY_TRACT
  Filled 2020-02-29: qty 8.8

## 2020-02-29 MED ORDER — ONDANSETRON HCL 4 MG PO TABS: 4.0000 mg | ORAL_TABLET | Freq: Four times a day (QID) | ORAL | Status: DC | PRN

## 2020-02-29 MED ORDER — SODIUM CHLORIDE 0.9% FLUSH
3.0000 mL | Freq: Once | INTRAVENOUS | Status: AC
  Administered 2020-03-01: 3 mL via INTRAVENOUS

## 2020-02-29 MED ORDER — SENNOSIDES-DOCUSATE SODIUM 8.6-50 MG PO TABS: 1.0000 | ORAL_TABLET | Freq: Every evening | ORAL | Status: DC | PRN

## 2020-02-29 MED ORDER — ALBUTEROL SULFATE HFA 108 (90 BASE) MCG/ACT IN AERS
2.0000 | INHALATION_SPRAY | Freq: Four times a day (QID) | RESPIRATORY_TRACT | Status: DC | PRN
  Filled 2020-02-29: qty 6.7

## 2020-02-29 MED ORDER — ONDANSETRON HCL 4 MG/2ML IJ SOLN
4.0000 mg | Freq: Four times a day (QID) | INTRAMUSCULAR | Status: DC | PRN
  Administered 2020-02-29 – 2020-03-01 (×3): 4 mg via INTRAVENOUS
  Filled 2020-02-29 (×3): qty 2

## 2020-02-29 NOTE — H&P (Signed)
History and Physical    Alexandra Wolfe YOV:[id number] DOB: 1946/12/08 DOA: 02/29/2020  PCP: Alexandra Borg, MD   Patient coming from: Home   Chief Complaint: Syncope, head injury   HPI: Alexandra Wolfe is a 74 y.o. female with medical history significant for type 2 diabetes mellitus, hypertension, asthma, hypothyroidism, and OSA with CPAP intolerance, now presenting to the emergency department after a syncopal episode with head injury.  Patient was taken to shower this afternoon when she suffered a syncopal episode.  She reports that she has been feeling a little bit weak past couple weeks, but has not had lightheadedness, chest pain, palpitations.  She was started 3 weeks ago limiting her carbohydrate and calories, and loss of mother's the first week.  She reports exertional dyspnea that has been going on for months, but no acute change and no fevers, chills, sore throat, loss of taste or smell.  Thyroid medications were recently adjusted.  ED Course: Upon arrival to the ED, patient is found to be afebrile, saturating well on room air, blood pressure 160/80.  EKG features sinus rhythm with nonspecific T wave abnormality.  Radiographs of the right shoulder are notable for severe osteoarthritic changes but no acute findings.  Noncontrast head CT reveals acute left subfalcine subdural hematoma without significant mass-effect.  No acute fracture is seen on cervical spine CT.  Chemistry panel with creatinine of 1.05, up from 0.8 in January.  CBC with leukocytosis that appears chronic.  INR and high-sensitivity troponin are normal.  Neurosurgery was consulted by ED physician and medical admission.  Patient was treated with Keppra and Dilaudid in the ED.  Review of Systems:  All other systems reviewed and apart from HPI, are negative.  Past Medical History:  Diagnosis Date  . ALLERGIC RHINITIS 04/02/2009   no per pt  . Anxiety   . BACK PAIN 09/27/2008  . BUNIONS, BILATERAL 12/23/2007  . CHEST  DISCOMFORT, ATYPICAL 11/07/2009  . Chronic LBP   . COLONIC POLYPS, HX OF 09/13/2007  . CONSTIPATION 09/27/2008  . DEPRESSION 09/13/2007  . Diabetes mellitus    diet controlled  . DYSPNEA ON EXERTION 02/06/2010  . Eustachian tube dysfunction 05/20/2011  . FOOT PAIN, RIGHT 09/27/2008  . HYPERLIPIDEMIA 03/11/2008  . LBP (low back pain) 05/20/2011  . Leukocytosis 11/20/2011  . OSTEOARTHROSIS NOS, LOWER LEG 09/13/2007  . SHOULDER PAIN, LEFT 02/14/2009  . SVT (supraventricular tachycardia) ([place])   . SYMPTOM, PALPITATIONS 09/13/2007  . URINARY INCONTINENCE 08/23/2009  . Vertigo 05/20/2011    Past Surgical History:  Procedure Laterality Date  . BUNIONECTOMY     right  . ccx    . CHOLECYSTECTOMY    . LUMBAR LAMINECTOMY    . LUMBAR LAMINECTOMY    . SHOULDER ARTHROSCOPY  12/13/2011   Procedure: ARTHROSCOPY SHOULDER;  Surgeon: Augustin Schooling;  Location: [place];  Service: Orthopedics;  Laterality: Left;  Left Shoulder ArthroscopyDebridement Limited Tenodesis Open Rotator Cuff Repair Spur Removal Right Shoulder Injection      reports that she has quit smoking. She has a 10.00 pack-year smoking history. She has never used smokeless tobacco. She reports that she does not drink alcohol or use drugs.  Allergies  Allergen Reactions  . Aripiprazole     REACTION: agitation  . Simvastatin     REACTION: myalgia    Family History  Problem Relation Age of Onset  . Colon cancer Mother   . Heart disease Father   . Diabetes type II Father   .  Heart disease Brother   . Diabetes Other        father  . Esophageal cancer Neg Hx   . Stomach cancer Neg Hx   . Rectal cancer Neg Hx      Prior to Admission medications   Medication Sig Start Date End Date Taking? Authorizing Provider  levothyroxine (SYNTHROID) 50 MCG tablet TAKE 1 TABLET(50 MCG) BY MOUTH DAILY 02/28/20   Alexandra Borg, MD  metFORMIN (GLUCOPHAGE-XR) 500 MG 24 hr tablet TAKE 4 TABLETS BY MOUTH EVERY MORNING 02/28/20   Alexandra Borg, MD  albuterol  (VENTOLIN HFA) 108 (90 Base) MCG/ACT inhaler Inhale 2 puffs into the lungs every 6 (six) hours as needed for wheezing or shortness of breath. 01/04/20   Alexandra Borg, MD  aspirin 81 MG tablet Take 81 mg by mouth daily.      [provider]  atorvastatin (LIPITOR) 80 MG tablet Take 1 tablet (80 mg total) by mouth daily. 07/05/19   Alexandra Borg, MD  Blood Glucose Monitoring Suppl [facility] VERIO) w/Device KIT Use as directed twice daily E11.9 Patient not taking: Reported on 01/04/2020 07/01/19   Alexandra Borg, MD  budesonide-formoterol [facility]) 160-4.5 MCG/ACT inhaler Inhale 2 puffs into the lungs 2 (two) times daily. 04/03/17   Deneise Lever, MD  cetirizine (ZYRTEC) 10 MG tablet Take 1 tablet (10 mg total) by mouth daily. 01/04/20 01/03/21  Alexandra Borg, MD  diclofenac (VOLTAREN) 75 MG EC tablet Take 75 mg by mouth 2 (two) times daily.    [provider]  empagliflozin (JARDIANCE) 25 MG TABS tablet Take 25 mg by mouth daily. 09/03/19   Alexandra Borg, MD  fluticasone [facility]) 50 MCG/ACT nasal spray SHAKE LIQUID AND USE 2 SPRAYS IN EACH NOSTRIL DAILY FOR ALLERGIES 05/18/18   Alexandra Borg, MD  glipiZIDE (GLUCOTROL XL) 2.5 MG 24 hr tablet Take 1 tablet (2.5 mg total) by mouth daily with breakfast. 07/05/19   Alexandra Borg, MD  glucose blood [facility] VERIO) test strip Use as instructed twice per day E11.9 Patient not taking: Reported on 01/04/2020 07/01/19   Alexandra Borg, MD  hydrochlorothiazide (HYDRODIURIL) 25 MG tablet TAKE 1/2 TABLET(12.5 MG) BY MOUTH DAILY 06/16/19   Alexandra Borg, MD  Lancets MISC Use as directed twice per day E11.9 Patient not taking: Reported on 01/04/2020 07/01/19   Alexandra Borg, MD  meclizine (ANTIVERT) 25 MG tablet Take 1 tablet (25 mg total) by mouth 3 (three) times daily as needed for dizziness. 03/04/18   Alexandra Borg, MD  metoprolol succinate (TOPROL-XL) 25 MG 24 hr tablet TAKE 1 TABLET(25 MG) BY MOUTH DAILY 11/09/19   Alexandra Borg, MD  MYRBETRIQ 50 MG TB24  tablet Take 50 mg by mouth daily. 12/30/19   [provider]  ondansetron (ZOFRAN ODT) 8 MG disintegrating tablet Take 1 tablet (8 mg total) by mouth every 8 (eight) hours as needed for nausea or vomiting. 09/23/17   Alexandra Borg, MD  oxybutynin (DITROPAN XL) 10 MG 24 hr tablet Take 1 tablet (10 mg total) by mouth at bedtime. Patient not taking: Reported on 01/04/2020 08/07/18   Alexandra Borg, MD  traZODone (DESYREL) 50 MG tablet TAKE 1 TABLET(50 MG) BY MOUTH AT BEDTIME 12/02/19   Young, Tarri Fuller D, MD  Vitamin D, Ergocalciferol, (DRISDOL) 1.25 MG (50000 UT) CAPS capsule Take 1 capsule (50,000 Units total) by mouth every 7 (seven) days. 07/05/19   Alexandra Borg, MD  Physical Exam: Vitals:   02/29/20 1553 02/29/20 1740 02/29/20 1745 02/29/20 1822  BP: (!) 144/82 (!) 157/62 (!) 155/61 (!) 162/79  Pulse: 72 72 71 72  Resp: 20 (!) 25 (!) 23 (!) 26  Temp: (!) 97.2 F (36.2 C)     TempSrc: Oral     SpO2: 100% 100% 100% 100%  Weight:      Height:        Constitutional: NAD, calm  Eyes: PERTLA, lids and conjunctivae normal ENMT: Mucous membranes are moist. Posterior pharynx clear of any exudate or lesions.   Neck: normal, supple, no masses, no thyromegaly Respiratory: clear to auscultation bilaterally, no wheezing, no crackles.    Cardiovascular: S1 & S2 heard, regular rate and rhythm. No extremity edema.   Abdomen: No distension, no tenderness, soft. Bowel sounds active.  Musculoskeletal: no clubbing / cyanosis. No joint deformity upper and lower extremities.   Skin: no significant rashes, lesions, ulcers. Warm, dry, well-perfused. Neurologic: CN 2-12 grossly intact. Sensation intact. Moving all extremities.  Psychiatric: Alert and oriented x 3. Pleasant and cooperative.    Labs and Imaging on Admission: I have personally reviewed following labs and imaging studies  CBC: Recent Labs  Lab 02/29/20 1603  WBC 13.4*  HGB 14.7  HCT 45.1  MCV 90.7  PLT 510   Basic Metabolic  Panel: Recent Labs  Lab 02/29/20 1603  NA 140  K 3.7  CL 103  CO2 19*  GLUCOSE 140*  BUN 29*  CREATININE 1.05*  CALCIUM 9.6   GFR: Estimated Creatinine Clearance: 59.1 mL/min (A) (by C-G formula based on SCr of 1.05 mg/dL (H)). Liver Function Tests: No results for input(s): AST, ALT, ALKPHOS, BILITOT, PROT, ALBUMIN in the last 168 hours. No results for input(s): LIPASE, AMYLASE in the last 168 hours. No results for input(s): AMMONIA in the last 168 hours. Coagulation Profile: Recent Labs  Lab 02/29/20 1632  INR 1.0   Cardiac Enzymes: No results for input(s): CKTOTAL, CKMB, CKMBINDEX, TROPONINI in the last 168 hours. BNP (last 3 results) No results for input(s): PROBNP in the last 8760 hours. HbA1C: No results for input(s): HGBA1C in the last 72 hours. CBG: No results for input(s): GLUCAP in the last 168 hours. Lipid Profile: No results for input(s): CHOL, HDL, LDLCALC, TRIG, CHOLHDL, LDLDIRECT in the last 72 hours. Thyroid Function Tests: No results for input(s): TSH, T4TOTAL, FREET4, T3FREE, THYROIDAB in the last 72 hours. Anemia Panel: No results for input(s): VITAMINB12, FOLATE, FERRITIN, TIBC, IRON, RETICCTPCT in the last 72 hours. Urine analysis:    Component Value Date/Time   COLORURINE YELLOW 07/01/2019 1429   APPEARANCEUR CLEAR 07/01/2019 1429   LABSPEC 1.020 07/01/2019 1429   PHURINE 5.5 07/01/2019 1429   GLUCOSEU >=1000 (A) 07/01/2019 1429   HGBUR NEGATIVE 07/01/2019 1429   BILIRUBINUR NEGATIVE 07/01/2019 1429   KETONESUR NEGATIVE 07/01/2019 1429   PROTEINUR NEGATIVE 08/20/2010 1012   UROBILINOGEN 0.2 07/01/2019 1429   NITRITE NEGATIVE 07/01/2019 1429   LEUKOCYTESUR NEGATIVE 07/01/2019 1429   Sepsis Labs: @LABRCNTIP (procalcitonin:4,lacticidven:4) )No results found for this or any previous visit (from the past 240 hour(s)).   Radiological Exams on Admission: CT HEAD WO CONTRAST  Result Date: 02/29/2020 CLINICAL DATA:  Syncopal episode, hit head  EXAM: CT HEAD WITHOUT CONTRAST TECHNIQUE: Contiguous axial images were obtained from the base of the skull through the vertex without intravenous contrast. COMPARISON:  None. FINDINGS: Brain: Acute subdural hematoma is present along the left aspect of the falx measuring up to  10 mm in thickness. There is no significant associated mass effect. Prominence of the ventricles and sulci reflects minor generalized parenchymal volume loss. Gray-white differentiation is preserved. Vascular: No hyperdense vessel or unexpected calcification. Skull: Calvarium is unremarkable. Sinuses/Orbits: No acute finding. Other: Posterior scalp soft tissue swelling. IMPRESSION: Acute left falcine subdural hematoma.  No significant mass effect. These results were called by telephone at the time of interpretation on 02/29/2020 at 5:01 pm to provider Veryl Speak , who verbally acknowledged these results. Electronically Signed   By: Macy Mis M.D.   On: 02/29/2020 17:04   CT Cervical Spine Wo Contrast  Result Date: 02/29/2020 CLINICAL DATA:  Syncope EXAM: CT CERVICAL SPINE WITHOUT CONTRAST TECHNIQUE: Multidetector CT imaging of the cervical spine was performed without intravenous contrast. Multiplanar CT image reconstructions were also generated. COMPARISON:  None. FINDINGS: Alignment: No significant listhesis. Skull base and vertebrae: No acute cervical spine fracture. Vertebral body heights are maintained apart from degenerative endplate irregularity. Soft tissues and spinal canal: No prevertebral fluid or swelling. No visible canal hematoma. Disc levels: Multilevel degenerative changes are present including disc space narrowing, endplate osteophytes, and facet and uncovertebral hypertrophy. Upper chest: Negative. Other: Mild calcified plaque at the left ICA origin. IMPRESSION: No acute cervical spine fracture. Electronically Signed   By: Macy Mis M.D.   On: 02/29/2020 17:07   DG Shoulder Right Portable  Result Date:  02/29/2020 CLINICAL DATA:  Fall in shower. Right shoulder pain. EXAM: PORTABLE RIGHT SHOULDER COMPARISON:  None. FINDINGS: Prominent spurring of the humeral head and glenoid with prominent loss of articular space in the glenohumeral joint compatible with severe osteoarthritis. No fracture or malalignment is identified. Subacromial morphology is type 2 (curved). IMPRESSION: Severe glenohumeral osteoarthritis. No acute findings. Electronically Signed   By: Van Clines M.D.   On: 02/29/2020 18:22    EKG: Independently reviewed. Sinus rhythm, non-specific T-wave abnormality.   Assessment/Plan   1. Acute subdural hematoma  - Presents after syncope in shower with scalp laceration and acute left subfalcine SDH on CT  - Neurosurgery consulting and much appreciated, recommending MAP 70-90, Keppra 500 mg BID x7 days, repeat non-contrast head CT in am, hold ASA 2 wks, and MRI lumbar spine if leg weakness/numbness persists    2. Syncope  - Presents after a syncopal episode in the shower without prodrome  - She intentionally lost 5 lbs recently, has been feeling a little weak in general, and this may have been related to orthostasis  - No associated chest pain or palpitations, EKG with SR and T-wave flattening  - Check orthostatic vitals, continue cardiac monitoring, check echocardiogram    3. Mild renal insufficiency  - SCr is 1.05 in ED, up from 0.80 in January 2021  - She recently started a new diet, has lost 5 lbs, and this may be acute prerenal azotemia  - Hydrate with IVF overnight and repeat chem panel in am    4. Asthma  - Patient reports months of DOE with occasional non-productive cough but no recent wheezing  - Continue ICS/LABA and as-needed albuterol    5. Type II DM   - A1c was 6.3% in January 2021  - Check CBGs and use a low-intensity SSI with Novolog as needed for now    6. Hypertension  - Keep MAP 70-90 per NSG recommendations, use labetalol IVP as needed    7.  Leukocytosis  - Appears chronic, no infectious s/s, culture if febrile     DVT prophylaxis: SCDs  Code Status: Full  Family Communication: Discussed with patient  Disposition Plan: Likely back home in 24-48 hours pending stable head CT, improved lightheadedness, neurosurgery clearance Consults called: Neurosurgery  Admission status: Observation     Vianne Bulls, MD [facility] Pager: See [email or web address]  If 7AM-7PM, please contact the daytime attending [email or web address]  02/29/2020, 8:18 PM

## 2020-02-29 NOTE — ED Provider Notes (Signed)
[place] EMERGENCY DEPARTMENT Provider Note   CSN: [id number] Arrival date & time: 02/29/20  1510     History Chief Complaint  Patient presents with  . Loss of Consciousness    Alexandra Wolfe is a 74 y.o. female.  Patient states she was taking a shower felt dizzy and fell out of the shower and was unconscious.  When she awoke she crawled to the phone and called for help.  Patient complains of some pain in the back of her head and right shoulder pain  The history is provided by the patient. No language interpreter was used.  Loss of Consciousness Episode history:  Single Most recent episode:  Today Timing:  Rare Progression:  Waxing and waning Chronicity:  New Context: not blood draw   Witnessed: no   Relieved by:  Nothing Worsened by:  Nothing Ineffective treatments:  None tried Associated symptoms: headaches   Associated symptoms: no anxiety, no chest pain and no seizures        Past Medical History:  Diagnosis Date  . ALLERGIC RHINITIS 04/02/2009   no per pt  . Anxiety   . BACK PAIN 09/27/2008  . BUNIONS, BILATERAL 12/23/2007  . CHEST DISCOMFORT, ATYPICAL 11/07/2009  . Chronic LBP   . COLONIC POLYPS, HX OF 09/13/2007  . CONSTIPATION 09/27/2008  . DEPRESSION 09/13/2007  . Diabetes mellitus    diet controlled  . DYSPNEA ON EXERTION 02/06/2010  . Eustachian tube dysfunction 05/20/2011  . FOOT PAIN, RIGHT 09/27/2008  . HYPERLIPIDEMIA 03/11/2008  . LBP (low back pain) 05/20/2011  . Leukocytosis 11/20/2011  . OSTEOARTHROSIS NOS, LOWER LEG 09/13/2007  . SHOULDER PAIN, LEFT 02/14/2009  . SVT (supraventricular tachycardia) ([place])   . SYMPTOM, PALPITATIONS 09/13/2007  . URINARY INCONTINENCE 08/23/2009  . Vertigo 05/20/2011    Patient Active Problem List   Diagnosis Date Noted  . Acute subdural hematoma ([place]) 02/29/2020  . Syncope 02/29/2020  . Mild renal insufficiency 02/29/2020  . Essential hypertension 02/29/2020  . Asthma 02/29/2020  . Chronic  dyspnea 01/04/2020  . Allergic rhinitis 01/04/2020  . External otitis of right ear 07/25/2019  . Bilateral foot pain 07/01/2019  . Diabetic neuropathy ([place]) 03/04/2018  . Obstructive sleep apnea 04/04/2016  . Peripheral edema 12/23/2015  . Hypersomnolence 12/22/2015  . Baker's cyst of knee 11/24/2013  . Left knee pain 11/24/2013  . Dyspnea 11/24/2013  . Rash 05/26/2012  . Bilateral shoulder pain 12/13/2011  . Diabetes mellitus type II, non insulin dependent ([place]) 11/20/2011  . Leukocytosis 11/20/2011  . Preoperative clearance 11/20/2011  . Eustachian tube dysfunction 05/20/2011  . LBP (low back pain) 05/20/2011  . Encounter for well adult exam with abnormal findings 05/20/2011  . Vertigo 05/20/2011  . Hypothyroidism 02/15/2011  . Other dysphagia 02/15/2011  . URINARY INCONTINENCE 08/23/2009  . Seasonal and perennial allergic rhinitis 04/02/2009  . CONSTIPATION 09/27/2008  . Hyperlipidemia 03/11/2008  . BUNIONS, BILATERAL 12/23/2007  . Depression 09/13/2007  . OSTEOARTHROSIS NOS, LOWER LEG 09/13/2007  . COLONIC POLYPS, HX OF 09/13/2007    Past Surgical History:  Procedure Laterality Date  . BUNIONECTOMY     right  . ccx    . CHOLECYSTECTOMY    . LUMBAR LAMINECTOMY    . LUMBAR LAMINECTOMY    . SHOULDER ARTHROSCOPY  12/13/2011   Procedure: ARTHROSCOPY SHOULDER;  Surgeon: Augustin Schooling;  Location: [place];  Service: Orthopedics;  Laterality: Left;  Left Shoulder ArthroscopyDebridement Limited Tenodesis Open Rotator Cuff Repair Spur Removal Right Shoulder  Injection      OB History    Gravida  2   Para  2   Term      Preterm      AB      Living  2     SAB      TAB      Ectopic      Multiple      Live Births              Family History  Problem Relation Age of Onset  . Colon cancer Mother   . Heart disease Father   . Diabetes type II Father   . Heart disease Brother   . Diabetes Other        father  . Esophageal cancer Neg Hx   . Stomach cancer  Neg Hx   . Rectal cancer Neg Hx     Social History   Tobacco Use  . Smoking status: Former Smoker    Packs/day: 1.00    Years: 10.00    Pack years: 10.00  . Smokeless tobacco: Never Used  . Tobacco comment: Smoked as a teenager. Quit in 1970  Substance Use Topics  . Alcohol use: No    Alcohol/week: 0.0 standard drinks  . Drug use: No    Home Medications Prior to Admission medications   Medication Sig Start Date End Date Taking? Authorizing Provider  levothyroxine (SYNTHROID) 50 MCG tablet TAKE 1 TABLET(50 MCG) BY MOUTH DAILY 02/28/20   Biagio Borg, MD  metFORMIN (GLUCOPHAGE-XR) 500 MG 24 hr tablet TAKE 4 TABLETS BY MOUTH EVERY MORNING 02/28/20   Biagio Borg, MD  albuterol (VENTOLIN HFA) 108 (90 Base) MCG/ACT inhaler Inhale 2 puffs into the lungs every 6 (six) hours as needed for wheezing or shortness of breath. 01/04/20   Biagio Borg, MD  aspirin 81 MG tablet Take 81 mg by mouth daily.      [provider]  atorvastatin (LIPITOR) 80 MG tablet Take 1 tablet (80 mg total) by mouth daily. 07/05/19   Biagio Borg, MD  Blood Glucose Monitoring Suppl [facility] VERIO) w/Device KIT Use as directed twice daily E11.9 Patient not taking: Reported on 01/04/2020 07/01/19   Biagio Borg, MD  budesonide-formoterol [facility]) 160-4.5 MCG/ACT inhaler Inhale 2 puffs into the lungs 2 (two) times daily. 04/03/17   Deneise Lever, MD  cetirizine (ZYRTEC) 10 MG tablet Take 1 tablet (10 mg total) by mouth daily. 01/04/20 01/03/21  Biagio Borg, MD  diclofenac (VOLTAREN) 75 MG EC tablet Take 75 mg by mouth 2 (two) times daily.    [provider]  empagliflozin (JARDIANCE) 25 MG TABS tablet Take 25 mg by mouth daily. 09/03/19   Biagio Borg, MD  fluticasone [facility]) 50 MCG/ACT nasal spray SHAKE LIQUID AND USE 2 SPRAYS IN EACH NOSTRIL DAILY FOR ALLERGIES 05/18/18   Biagio Borg, MD  glipiZIDE (GLUCOTROL XL) 2.5 MG 24 hr tablet Take 1 tablet (2.5 mg total) by mouth daily with breakfast.  07/05/19   Biagio Borg, MD  glucose blood [facility] VERIO) test strip Use as instructed twice per day E11.9 Patient not taking: Reported on 01/04/2020 07/01/19   Biagio Borg, MD  hydrochlorothiazide (HYDRODIURIL) 25 MG tablet TAKE 1/2 TABLET(12.5 MG) BY MOUTH DAILY 06/16/19   Biagio Borg, MD  Lancets MISC Use as directed twice per day E11.9 Patient not taking: Reported on 01/04/2020 07/01/19   Biagio Borg, MD  meclizine [facility])  25 MG tablet Take 1 tablet (25 mg total) by mouth 3 (three) times daily as needed for dizziness. 03/04/18   Biagio Borg, MD  metoprolol succinate (TOPROL-XL) 25 MG 24 hr tablet TAKE 1 TABLET(25 MG) BY MOUTH DAILY 11/09/19   Biagio Borg, MD  MYRBETRIQ 50 MG TB24 tablet Take 50 mg by mouth daily. 12/30/19   [provider]  ondansetron (ZOFRAN ODT) 8 MG disintegrating tablet Take 1 tablet (8 mg total) by mouth every 8 (eight) hours as needed for nausea or vomiting. 09/23/17   Biagio Borg, MD  oxybutynin (DITROPAN XL) 10 MG 24 hr tablet Take 1 tablet (10 mg total) by mouth at bedtime. Patient not taking: Reported on 01/04/2020 08/07/18   Biagio Borg, MD  traZODone (DESYREL) 50 MG tablet TAKE 1 TABLET(50 MG) BY MOUTH AT BEDTIME 12/02/19   Young, Tarri Fuller D, MD  Vitamin D, Ergocalciferol, (DRISDOL) 1.25 MG (50000 UT) CAPS capsule Take 1 capsule (50,000 Units total) by mouth every 7 (seven) days. 07/05/19   Biagio Borg, MD    Allergies    Aripiprazole and Simvastatin  Review of Systems   Review of Systems  Constitutional: Negative for appetite change and fatigue.  HENT: Negative for congestion, ear discharge and sinus pressure.   Eyes: Negative for discharge.  Respiratory: Negative for cough.   Cardiovascular: Positive for syncope. Negative for chest pain.  Gastrointestinal: Negative for abdominal pain and diarrhea.  Genitourinary: Negative for frequency and hematuria.  Musculoskeletal: Negative for back pain.       Right shoulder pain  Skin: Negative  for rash.  Neurological: Positive for headaches. Negative for seizures.  Psychiatric/Behavioral: Negative for hallucinations.    Physical Exam Updated Vital Signs BP (!) 162/79 (BP Location: Right Arm)   Pulse 72   Temp (!) 97.2 F (36.2 C) (Oral)   Resp (!) 26   Ht 5' 6"  (1.676 m)   Wt 110.2 kg   SpO2 100%   BMI 39.22 kg/m   Physical Exam Vitals and nursing note reviewed.  Constitutional:      Appearance: She is well-developed.  HENT:     Head: Normocephalic.     Comments: Abrasion to the back of the head    Nose: Nose normal.  Eyes:     General: No scleral icterus.    Conjunctiva/sclera: Conjunctivae normal.  Neck:     Thyroid: No thyromegaly.  Cardiovascular:     Rate and Rhythm: Normal rate and regular rhythm.     Heart sounds: No murmur. No friction rub. No gallop.   Pulmonary:     Breath sounds: No stridor. No wheezing or rales.  Chest:     Chest wall: No tenderness.  Abdominal:     General: There is no distension.     Tenderness: There is no abdominal tenderness. There is no rebound.  Musculoskeletal:        General: Normal range of motion.     Cervical back: Neck supple.     Comments: Tender right shoulder  Lymphadenopathy:     Cervical: No cervical adenopathy.  Skin:    Findings: No erythema or rash.  Neurological:     Mental Status: She is oriented to person, place, and time.     Motor: No abnormal muscle tone.     Coordination: Coordination normal.  Psychiatric:        Behavior: Behavior normal.     ED Results / Procedures / Treatments   Labs (all  labs ordered are listed, but only abnormal results are displayed) Labs Reviewed  BASIC METABOLIC PANEL - Abnormal; Notable for the following components:      Result Value   CO2 19 (*)    Glucose, Bld 140 (*)    BUN 29 (*)    Creatinine, Ser 1.05 (*)    GFR calc non Af Amer 52 (*)    Anion gap 18 (*)    All other components within normal limits  CBC - Abnormal; Notable for the following  components:   WBC 13.4 (*)    All other components within normal limits  SARS CORONAVIRUS 2 (TAT 6-24 HRS)  PROTIME-INR  APTT  URINALYSIS, ROUTINE W REFLEX MICROSCOPIC  CBG MONITORING, ED  TROPONIN I (HIGH SENSITIVITY)  TROPONIN I (HIGH SENSITIVITY)    EKG EKG Interpretation  Date/Time:  Tuesday February 29 2020 15:49:38 EDT Ventricular Rate:  73 PR Interval:  132 QRS Duration: 84 QT Interval:  422 QTC Calculation: 464 R Axis:   19 Text Interpretation: Normal sinus rhythm Nonspecific T wave abnormality Abnormal ECG Confirmed by Milton Ferguson [phone] on 02/29/2020 5:45:19 PM Also confirmed by Milton Ferguson [phone])  on 02/29/2020 7:30:28 PM   Radiology CT HEAD WO CONTRAST  Result Date: 02/29/2020 CLINICAL DATA:  Syncopal episode, hit head EXAM: CT HEAD WITHOUT CONTRAST TECHNIQUE: Contiguous axial images were obtained from the base of the skull through the vertex without intravenous contrast. COMPARISON:  None. FINDINGS: Brain: Acute subdural hematoma is present along the left aspect of the falx measuring up to 10 mm in thickness. There is no significant associated mass effect. Prominence of the ventricles and sulci reflects minor generalized parenchymal volume loss. Gray-white differentiation is preserved. Vascular: No hyperdense vessel or unexpected calcification. Skull: Calvarium is unremarkable. Sinuses/Orbits: No acute finding. Other: Posterior scalp soft tissue swelling. IMPRESSION: Acute left falcine subdural hematoma.  No significant mass effect. These results were called by telephone at the time of interpretation on 02/29/2020 at 5:01 pm to provider Veryl Speak , who verbally acknowledged these results. Electronically Signed   By: Macy Mis M.D.   On: 02/29/2020 17:04   CT Cervical Spine Wo Contrast  Result Date: 02/29/2020 CLINICAL DATA:  Syncope EXAM: CT CERVICAL SPINE WITHOUT CONTRAST TECHNIQUE: Multidetector CT imaging of the cervical spine was performed without  intravenous contrast. Multiplanar CT image reconstructions were also generated. COMPARISON:  None. FINDINGS: Alignment: No significant listhesis. Skull base and vertebrae: No acute cervical spine fracture. Vertebral body heights are maintained apart from degenerative endplate irregularity. Soft tissues and spinal canal: No prevertebral fluid or swelling. No visible canal hematoma. Disc levels: Multilevel degenerative changes are present including disc space narrowing, endplate osteophytes, and facet and uncovertebral hypertrophy. Upper chest: Negative. Other: Mild calcified plaque at the left ICA origin. IMPRESSION: No acute cervical spine fracture. Electronically Signed   By: Macy Mis M.D.   On: 02/29/2020 17:07   DG Shoulder Right Portable  Result Date: 02/29/2020 CLINICAL DATA:  Fall in shower. Right shoulder pain. EXAM: PORTABLE RIGHT SHOULDER COMPARISON:  None. FINDINGS: Prominent spurring of the humeral head and glenoid with prominent loss of articular space in the glenohumeral joint compatible with severe osteoarthritis. No fracture or malalignment is identified. Subacromial morphology is type 2 (curved). IMPRESSION: Severe glenohumeral osteoarthritis. No acute findings. Electronically Signed   By: Van Clines M.D.   On: 02/29/2020 18:22    Procedures Procedures (including critical care time)  Medications Ordered in ED Medications  sodium chloride flush (NS)  0.9 % injection 3 mL (has no administration in time range)  levETIRAcetam (KEPPRA) IVPB 500 mg/100 mL premix (has no administration in time range)  levETIRAcetam (KEPPRA) IVPB 500 mg/100 mL premix (has no administration in time range)  labetalol (NORMODYNE) injection 10 mg (has no administration in time range)  ondansetron (ZOFRAN) tablet 4 mg (has no administration in time range)    Or  ondansetron (ZOFRAN) injection 4 mg (has no administration in time range)  HYDROmorphone (DILAUDID) injection 0.5 mg (0.5 mg Intravenous  Given 02/29/20 1820)    ED Course  I have reviewed the triage vital signs and the nursing notes.  Pertinent labs & imaging results that were available during my care of the patient were reviewed by me and considered in my medical decision making (see chart for details). CRITICAL CARE Performed by: Milton Ferguson Total critical care time:35 minutes Critical care time was exclusive of separately billable procedures and treating other patients. Critical care was necessary to treat or prevent imminent or life-threatening deterioration. Critical care was time spent personally by me on the following activities: development of treatment plan with patient and/or surrogate as well as nursing, discussions with consultants, evaluation of patient's response to treatment, examination of patient, obtaining history from patient or surrogate, ordering and performing treatments and interventions, ordering and review of laboratory studies, ordering and review of radiographic studies, pulse oximetry and re-evaluation of patient's condition.    MDM Rules/Calculators/A&P                     Patient with syncope and a small subdural.  I spoke with neurosurgery and they reviewed the CT scan and recommended putting the patient on Keppra 5 mg twice a day for a week and repeating the CT scan tomorrow.  Neurosurgery also stated to try to keep the MAP 70-90 and they will see the patient tomorrow  Final Clinical Impression(s) / ED Diagnoses Final diagnoses:  Syncope and collapse    Rx / DC Orders ED Discharge Orders    None       Milton Ferguson, MD 02/29/20 2019

## 2020-02-29 NOTE — ED Triage Notes (Addendum)
Pt arrives to ED for syncopal episode while she was in the shower. Denies feeling CP or SOB prior to the event. Pt hit the back of her head- has lac with controlled bleeding. Pt has C collar in place from EMS. (EMS did not give this RN report.) Pt states after falling she now feels dizzy, breathing heavy/SOB. Pt also complains of right shoulder pain. Pt states she vomited after hitting her head.

## 2020-02-29 NOTE — ED Notes (Signed)
Purewick placed on patient.

## 2020-02-29 NOTE — Consult Note (Signed)
Chief Complaint   Chief Complaint  Patient presents with  . Loss of Consciousness    History of Present Illness  Alexandra Wolfe is a 74 y.o. female with DM, chronic back pain s/p laminectomies who suffered a fall in the bathroom, hitting her head and shoulder.  She complains of some headache, right shoulder pain and tenderness, and left leg numbness.  She is on a baby aspirin at home.  Past Medical History   Past Medical History:  Diagnosis Date  . ALLERGIC RHINITIS 04/02/2009   no per pt  . Anxiety   . BACK PAIN 09/27/2008  . BUNIONS, BILATERAL 12/23/2007  . CHEST DISCOMFORT, ATYPICAL 11/07/2009  . Chronic LBP   . COLONIC POLYPS, HX OF 09/13/2007  . CONSTIPATION 09/27/2008  . DEPRESSION 09/13/2007  . Diabetes mellitus    diet controlled  . DYSPNEA ON EXERTION 02/06/2010  . Eustachian tube dysfunction 05/20/2011  . FOOT PAIN, RIGHT 09/27/2008  . HYPERLIPIDEMIA 03/11/2008  . LBP (low back pain) 05/20/2011  . Leukocytosis 11/20/2011  . OSTEOARTHROSIS NOS, LOWER LEG 09/13/2007  . SHOULDER PAIN, LEFT 02/14/2009  . SVT (supraventricular tachycardia) ([place])   . SYMPTOM, PALPITATIONS 09/13/2007  . URINARY INCONTINENCE 08/23/2009  . Vertigo 05/20/2011    Past Surgical History   Past Surgical History:  Procedure Laterality Date  . BUNIONECTOMY     right  . ccx    . CHOLECYSTECTOMY    . LUMBAR LAMINECTOMY    . LUMBAR LAMINECTOMY    . SHOULDER ARTHROSCOPY  12/13/2011   Procedure: ARTHROSCOPY SHOULDER;  Surgeon: Augustin Schooling;  Location: [place];  Service: Orthopedics;  Laterality: Left;  Left Shoulder ArthroscopyDebridement Limited Tenodesis Open Rotator Cuff Repair Spur Removal Right Shoulder Injection     Social History   Social History   Tobacco Use  . Smoking status: Former Smoker    Packs/day: 1.00    Years: 10.00    Pack years: 10.00  . Smokeless tobacco: Never Used  . Tobacco comment: Smoked as a teenager. Quit in 1970  Substance Use Topics  . Alcohol use: No     Alcohol/week: 0.0 standard drinks  . Drug use: No    Medications   Prior to Admission medications   Medication Sig Start Date End Date Taking? Authorizing Provider  levothyroxine (SYNTHROID) 50 MCG tablet TAKE 1 TABLET(50 MCG) BY MOUTH DAILY 02/28/20   Biagio Borg, MD  metFORMIN (GLUCOPHAGE-XR) 500 MG 24 hr tablet TAKE 4 TABLETS BY MOUTH EVERY MORNING 02/28/20   Biagio Borg, MD  albuterol (VENTOLIN HFA) 108 (90 Base) MCG/ACT inhaler Inhale 2 puffs into the lungs every 6 (six) hours as needed for wheezing or shortness of breath. 01/04/20   Biagio Borg, MD  aspirin 81 MG tablet Take 81 mg by mouth daily.      [provider]  atorvastatin (LIPITOR) 80 MG tablet Take 1 tablet (80 mg total) by mouth daily. 07/05/19   Biagio Borg, MD  Blood Glucose Monitoring Suppl [facility] VERIO) w/Device KIT Use as directed twice daily E11.9 Patient not taking: Reported on 01/04/2020 07/01/19   Biagio Borg, MD  budesonide-formoterol [facility]) 160-4.5 MCG/ACT inhaler Inhale 2 puffs into the lungs 2 (two) times daily. 04/03/17   Deneise Lever, MD  cetirizine (ZYRTEC) 10 MG tablet Take 1 tablet (10 mg total) by mouth daily. 01/04/20 01/03/21  Biagio Borg, MD  diclofenac (VOLTAREN) 75 MG EC tablet Take 75 mg by mouth 2 (two) times daily.  [provider]  empagliflozin (JARDIANCE) 25 MG TABS tablet Take 25 mg by mouth daily. 09/03/19   Biagio Borg, MD  fluticasone [facility]) 50 MCG/ACT nasal spray SHAKE LIQUID AND USE 2 SPRAYS IN EACH NOSTRIL DAILY FOR ALLERGIES 05/18/18   Biagio Borg, MD  glipiZIDE (GLUCOTROL XL) 2.5 MG 24 hr tablet Take 1 tablet (2.5 mg total) by mouth daily with breakfast. 07/05/19   Biagio Borg, MD  glucose blood [facility] VERIO) test strip Use as instructed twice per day E11.9 Patient not taking: Reported on 01/04/2020 07/01/19   Biagio Borg, MD  hydrochlorothiazide (HYDRODIURIL) 25 MG tablet TAKE 1/2 TABLET(12.5 MG) BY MOUTH DAILY 06/16/19   Biagio Borg, MD   Lancets MISC Use as directed twice per day E11.9 Patient not taking: Reported on 01/04/2020 07/01/19   Biagio Borg, MD  meclizine (ANTIVERT) 25 MG tablet Take 1 tablet (25 mg total) by mouth 3 (three) times daily as needed for dizziness. 03/04/18   Biagio Borg, MD  metoprolol succinate (TOPROL-XL) 25 MG 24 hr tablet TAKE 1 TABLET(25 MG) BY MOUTH DAILY 11/09/19   Biagio Borg, MD  MYRBETRIQ 50 MG TB24 tablet Take 50 mg by mouth daily. 12/30/19   [provider]  ondansetron (ZOFRAN ODT) 8 MG disintegrating tablet Take 1 tablet (8 mg total) by mouth every 8 (eight) hours as needed for nausea or vomiting. 09/23/17   Biagio Borg, MD  oxybutynin (DITROPAN XL) 10 MG 24 hr tablet Take 1 tablet (10 mg total) by mouth at bedtime. Patient not taking: Reported on 01/04/2020 08/07/18   Biagio Borg, MD  traZODone (DESYREL) 50 MG tablet TAKE 1 TABLET(50 MG) BY MOUTH AT BEDTIME 12/02/19   Young, Tarri Fuller D, MD  Vitamin D, Ergocalciferol, (DRISDOL) 1.25 MG (50000 UT) CAPS capsule Take 1 capsule (50,000 Units total) by mouth every 7 (seven) days. 07/05/19   Biagio Borg, MD    Allergies   Allergies  Allergen Reactions  . Aripiprazole     REACTION: agitation  . Simvastatin     REACTION: myalgia    Review of Systems  ROS  Neurologic Exam  Awake, alert, oriented to person, hospital, month. Memory and concentration grossly intact Speech fluent, appropriate CN grossly intact Motor exam: Proximal RUE limited by pain Full strength LUE and distal RUE. Numbness in left lower leg and foot, lateral>medial 3/5 strength KE,  DF, EHL, 4/5 PF on left, full on right  Imaging  CT head shows a small falcine SDH  Impression  - 74 y.o. female s/p fall with falcotentorial SDH  Plan  - recommend keeping MAPs 70-90 - Keppra 500 mg BID x 7 days - repeat CT head without contrast in am - hold aspirin for 2 weeks - if leg numbness/weakness persists, may need lumbar spine MRI

## 2020-02-29 NOTE — ED Notes (Signed)
RN walked into the room, pt stated "hey look".  She was able to move both legs in the air, it appears symptoms have resolved.

## 2020-03-01 ENCOUNTER — Observation Stay (HOSPITAL_BASED_OUTPATIENT_CLINIC_OR_DEPARTMENT_OTHER): Payer: Medicare Other

## 2020-03-01 ENCOUNTER — Observation Stay (HOSPITAL_COMMUNITY): Payer: Medicare Other

## 2020-03-01 DIAGNOSIS — E119 Type 2 diabetes mellitus without complications: Secondary | ICD-10-CM | POA: Diagnosis not present

## 2020-03-01 DIAGNOSIS — N289 Disorder of kidney and ureter, unspecified: Secondary | ICD-10-CM | POA: Diagnosis not present

## 2020-03-01 DIAGNOSIS — S065X9A Traumatic subdural hemorrhage with loss of consciousness of unspecified duration, initial encounter: Secondary | ICD-10-CM | POA: Diagnosis not present

## 2020-03-01 DIAGNOSIS — R55 Syncope and collapse: Secondary | ICD-10-CM

## 2020-03-01 DIAGNOSIS — I6201 Nontraumatic acute subdural hemorrhage: Secondary | ICD-10-CM | POA: Diagnosis not present

## 2020-03-01 DIAGNOSIS — R42 Dizziness and giddiness: Secondary | ICD-10-CM | POA: Diagnosis not present

## 2020-03-01 DIAGNOSIS — S065X0A Traumatic subdural hemorrhage without loss of consciousness, initial encounter: Secondary | ICD-10-CM | POA: Diagnosis not present

## 2020-03-01 LAB — COMPREHENSIVE METABOLIC PANEL
ALT: 42 U/L (ref 0–44)
AST: 35 U/L (ref 15–41)
Albumin: 4 g/dL (ref 3.5–5.0)
Alkaline Phosphatase: 70 U/L (ref 38–126)
Anion gap: 14 (ref 5–15)
BUN: 22 mg/dL (ref 8–23)
CO2: 21 mmol/L — ABNORMAL LOW (ref 22–32)
Calcium: 9.4 mg/dL (ref 8.9–10.3)
Chloride: 107 mmol/L (ref 98–111)
Creatinine, Ser: 0.93 mg/dL (ref 0.44–1.00)
GFR calc Af Amer: >60 mL/min (ref >60)
GFR calc non Af Amer: >60 mL/min (ref >60)
Glucose, Bld: 122 mg/dL — ABNORMAL HIGH (ref 70–99)
Potassium: 3.5 mmol/L (ref 3.5–5.1)
Sodium: 142 mmol/L (ref 135–145)
Total Bilirubin: 0.8 mg/dL (ref 0.3–1.2)
Total Protein: 6.5 g/dL (ref 6.5–8.1)

## 2020-03-01 LAB — GLUCOSE, CAPILLARY
Glucose-Capillary: 114 mg/dL — ABNORMAL HIGH (ref 70–99)
Glucose-Capillary: 128 mg/dL — ABNORMAL HIGH (ref 70–99)
Glucose-Capillary: 129 mg/dL — ABNORMAL HIGH (ref 70–99)
Glucose-Capillary: 87 mg/dL (ref 70–99)
Glucose-Capillary: 94 mg/dL (ref 70–99)

## 2020-03-01 LAB — CBG MONITORING, ED: Glucose-Capillary: 95 mg/dL (ref 70–99)

## 2020-03-01 LAB — CBC WITH DIFFERENTIAL/PLATELET
Abs Immature Granulocytes: 0.05 10*3/uL (ref 0.00–0.07)
Basophils Absolute: 0.1 10*3/uL (ref 0.0–0.1)
Basophils Relative: 0 %
Eosinophils Absolute: 0.2 10*3/uL (ref 0.0–0.5)
Eosinophils Relative: 1 %
HCT: 41.9 % (ref 36.0–46.0)
Hemoglobin: 14.2 g/dL (ref 12.0–15.0)
Immature Granulocytes: 0 %
Lymphocytes Relative: 36 %
Lymphs Abs: 5.2 10*3/uL — ABNORMAL HIGH (ref 0.7–4.0)
MCH: 29.8 pg (ref 26.0–34.0)
MCHC: 33.9 g/dL (ref 30.0–36.0)
MCV: 88 fL (ref 80.0–100.0)
Monocytes Absolute: 0.9 10*3/uL (ref 0.1–1.0)
Monocytes Relative: 6 %
Neutro Abs: 8.1 10*3/uL — ABNORMAL HIGH (ref 1.7–7.7)
Neutrophils Relative %: 57 %
Platelets: 341 10*3/uL (ref 150–400)
RBC: 4.76 MIL/uL (ref 3.87–5.11)
RDW: 13.5 % (ref 11.5–15.5)
WBC: 14.4 10*3/uL — ABNORMAL HIGH (ref 4.0–10.5)
nRBC: 0 % (ref 0.0–0.2)

## 2020-03-01 LAB — ECHOCARDIOGRAM COMPLETE
Height: 66 in
Weight: 3888 oz

## 2020-03-01 LAB — SARS CORONAVIRUS 2 (TAT 6-24 HRS): SARS Coronavirus 2: NEGATIVE

## 2020-03-01 IMAGING — CT CT HEAD W/O CM
3 series · 14 of 47 positions shown, 16 images · non-contrast
Comparison: Head CT [DATE]

CLINICAL DATA: Head trauma, intracranial venous injury suspected.
Additional history provided: Patient admitted yesterday status post
fall, CT showed small falcine subdural hematoma, patient very dizzy
today, reports occipital headache

EXAM:
CT HEAD WITHOUT CONTRAST
TECHNIQUE: Contiguous axial images were obtained from the base of the skull
through the vertex without intravenous contrast.

[Series 3: head 5.0 h30s · axial · 0.46mm/px · z∈[-102,+33]mm · 8 of 33 slices shown, 10 images]
[im 3/33  brain]
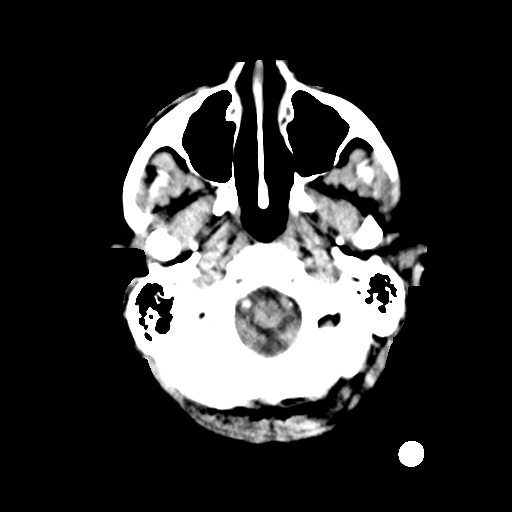
[im 3/33  bone]
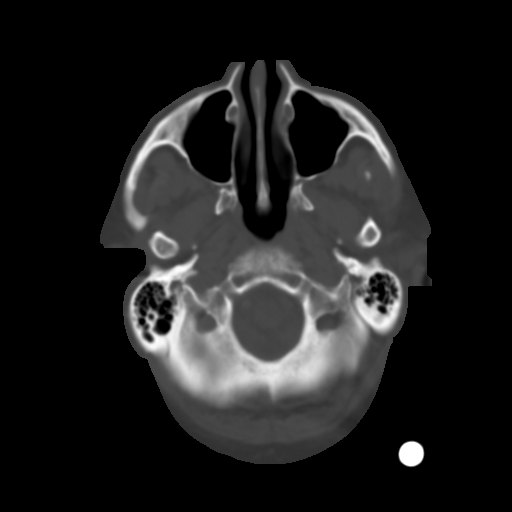
[im 7/33  brain]
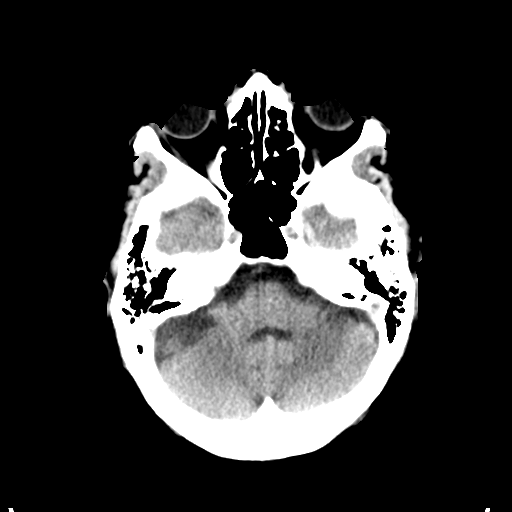
[im 10/33  brain]
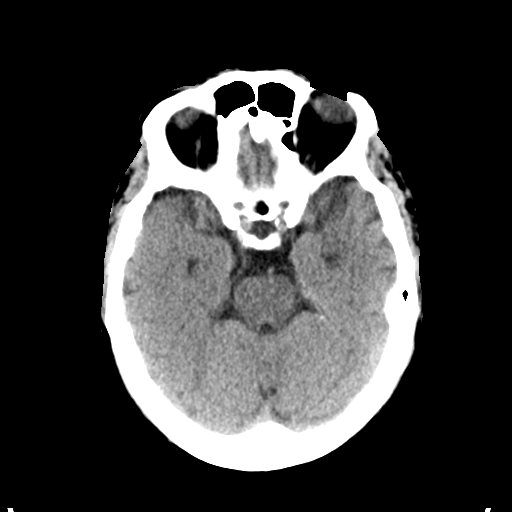
[im 15/33  brain]
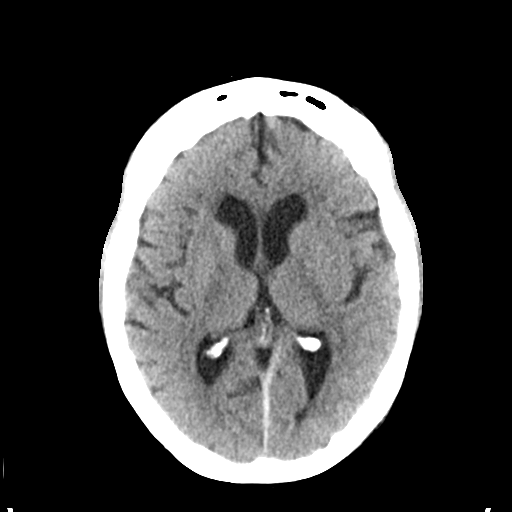
[im 18/33  brain]
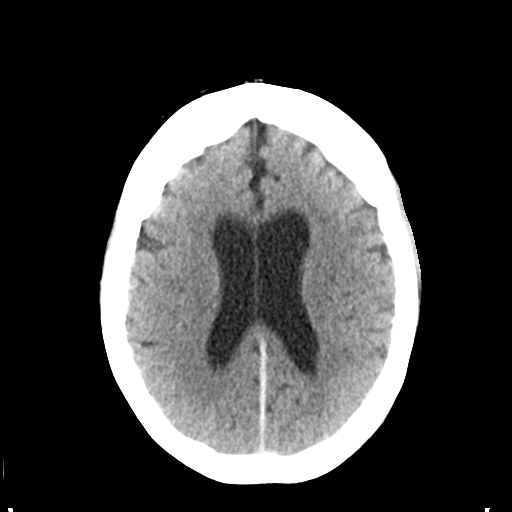
[im 18/33  bone]
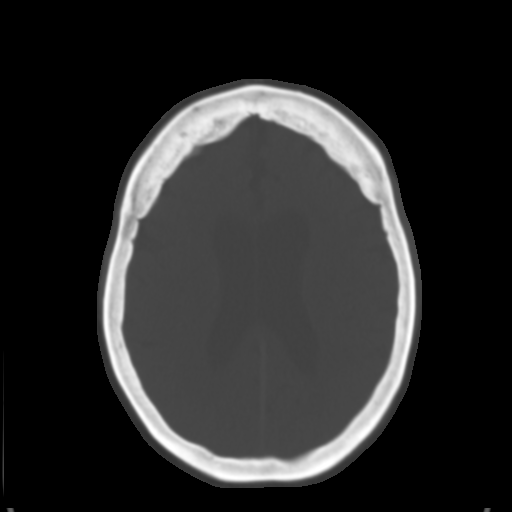
[im 23/33  brain]
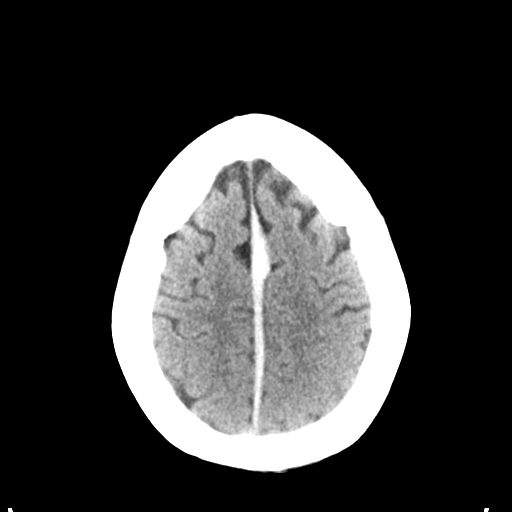
[im 26/33  brain]
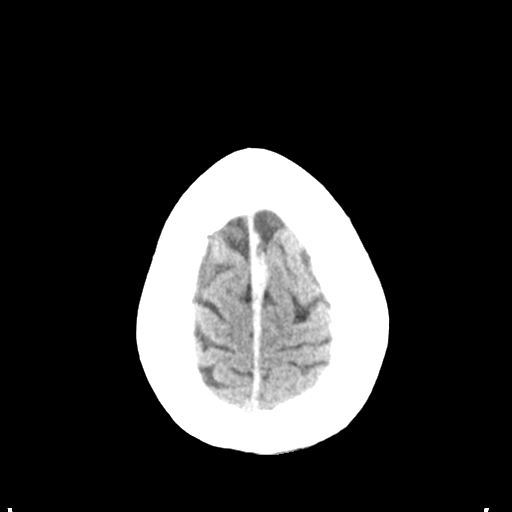
[im 30/33  brain]
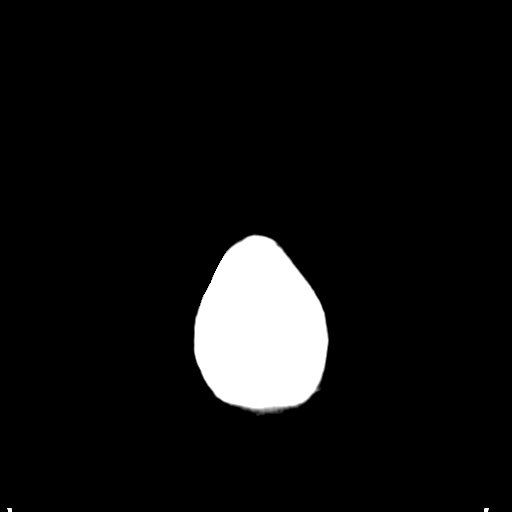

[Series 5: head 3.0 mpr cor · coronal · 0.31mm/px · 3 of 79 slices shown]
[im 27/79  brain]
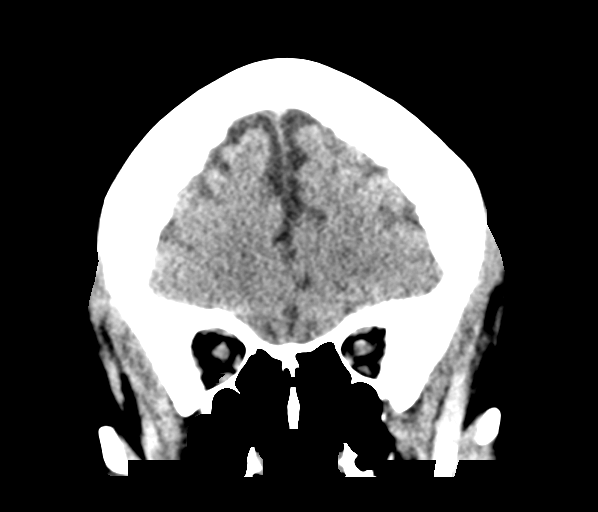
[im 35/79  brain]
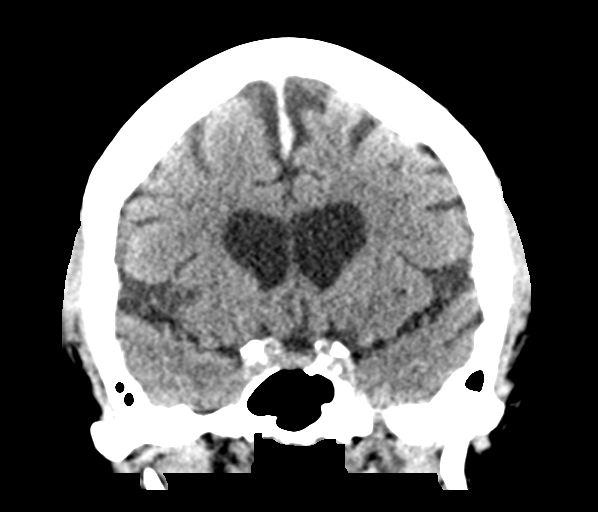
[im 44/79  brain]
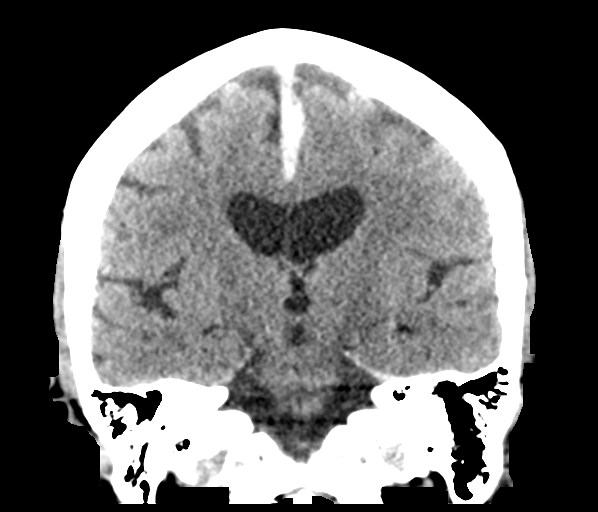

[Series 6: head 3.0 mpr sag · sagittal · 0.36mm/px · 3 of 67 slices shown]
[im 23/67  brain]
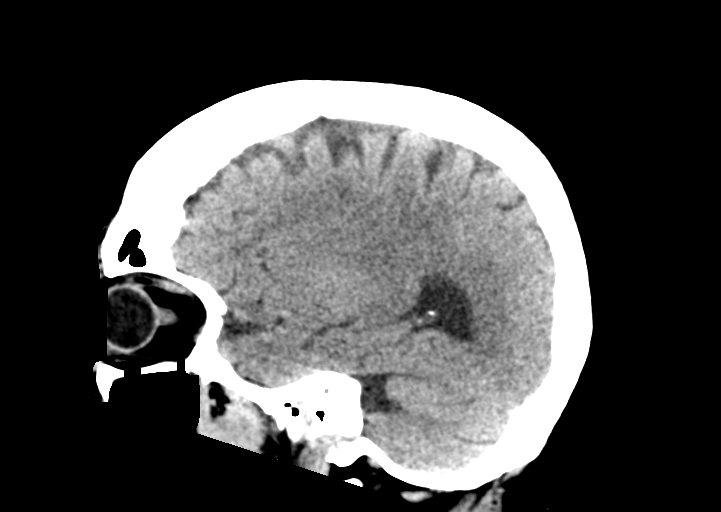
[im 34/67  brain]
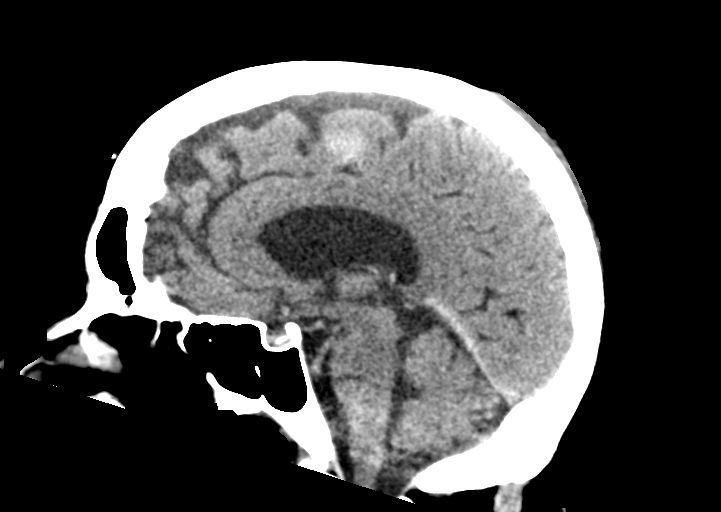
[im 45/67  brain]
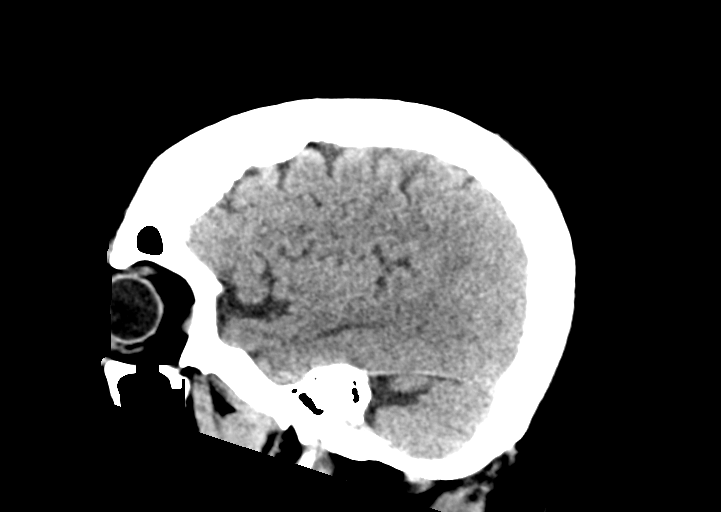

[14 of 47 positions shown; findings below may reference images not displayed]

FINDINGS: Brain: An acute subdural hemorrhage layering along the left aspect
of the falx and left tentorium is unchanged as compared to head CT
[DATE]. The hemorrhage again measures up to 10 mm in greatest
thickness (series 3, image 24). No interval hemorrhage is
demonstrated. No evidence of intracranial mass. No midline shift. No
hydrocephalus.

Vascular: No hyperdense vessel.  Atherosclerotic calcifications.

Skull: Normal. Negative for fracture or focal lesion.

Sinuses/Orbits: Visualized orbits demonstrate no acute abnormality.
Trace ethmoid sinus mucosal thickening. No significant mastoid
effusion.

Other: Redemonstrated left parietal scalp hematoma. There is also a
small amount of subcutaneous gas at this site suggestive of
laceration.
IMPRESSION: Stable acute subdural hemorrhage layering along the left aspect of
the falx and left tentorium, again measuring up to 10 mm in
thickness. No significant mass effect.

Redemonstrated left posterior scalp hematoma/laceration.

## 2020-03-01 MED ORDER — CANAGLIFLOZIN 100 MG PO TABS
100.0000 mg | ORAL_TABLET | Freq: Every day | ORAL | Status: DC
  Administered 2020-03-02: 100 mg via ORAL
  Filled 2020-03-01: qty 1

## 2020-03-01 MED ORDER — ALBUTEROL SULFATE (2.5 MG/3ML) 0.083% IN NEBU: 3.0000 mL | INHALATION_SOLUTION | Freq: Four times a day (QID) | RESPIRATORY_TRACT | Status: DC | PRN

## 2020-03-01 MED ORDER — PROMETHAZINE HCL 25 MG PO TABS
25.0000 mg | ORAL_TABLET | Freq: Once | ORAL | Status: AC
  Administered 2020-03-01: 25 mg via ORAL
  Filled 2020-03-01: qty 1

## 2020-03-01 MED ORDER — METOPROLOL SUCCINATE ER 25 MG PO TB24
25.0000 mg | ORAL_TABLET | Freq: Every day | ORAL | Status: DC
  Administered 2020-03-01 – 2020-03-02 (×2): 25 mg via ORAL
  Filled 2020-03-01 (×2): qty 1

## 2020-03-01 MED ORDER — ACETAMINOPHEN 325 MG PO TABS
650.0000 mg | ORAL_TABLET | Freq: Four times a day (QID) | ORAL | Status: DC | PRN
  Administered 2020-03-01: 650 mg via ORAL
  Filled 2020-03-01: qty 2

## 2020-03-01 MED ORDER — HYDRALAZINE HCL 10 MG PO TABS: 10.0000 mg | ORAL_TABLET | Freq: Four times a day (QID) | ORAL | Status: DC | PRN

## 2020-03-01 MED ORDER — MECLIZINE HCL 25 MG PO TABS
25.0000 mg | ORAL_TABLET | Freq: Three times a day (TID) | ORAL | Status: DC | PRN
  Administered 2020-03-01 – 2020-03-02 (×3): 25 mg via ORAL
  Filled 2020-03-01 (×4): qty 1

## 2020-03-01 NOTE — Progress Notes (Addendum)
Physical Therapy Treatment/Vestibular Assessment Patient Details Name: Alexandra Wolfe MRN: [id number] DOB: 09/14/1946 Today's Date: 03/01/2020    History of Present Illness Pt is a 74 y/o female admitted secondary to syncopal episode with fall from shower. Found to have subdural hematoma in L falx and L tentorium. Pt also with dizziness. PMH includes DM, HTN, asthma, and OSA with CPAP intolerance.     PT Comments     Pt's vestibular assessment is consistent with central dizziness which matches her SDH.  She does not appear to have BPPV from the hit to the head.  She will benefit from compensation, habituation, and medications to help her symptoms.  She is also reporting double vision.  I have asked for OT consult to help with glasses to help her compensate for the double vision.  Of note, she wears reading glasses and when she closes one or the other eye the double vision goes away.  She reports she can go home with her son to supervise her until she feels better.  PT will continue to follow acutely for safe mobility progression.  Planning to review x 1 seated vertical and horizontal head shaking exercises for habituation next session.     03/01/20 1355  Vital Signs  Patient Position (if appropriate) Orthostatic Vitals  Orthostatic Lying   BP- Lying 147/66  Pulse- Lying 88  Orthostatic Sitting  BP- Sitting 148/58  Pulse- Sitting 77  Orthostatic Standing at 0 minutes  BP- Standing at 0 minutes 157/74  Pulse- Standing at 0 minutes 100      Follow Up Recommendations  Home health PT;Supervision for mobility/OOB     Equipment Recommendations  None recommended by PT    Recommendations for Other Services OT consult     Precautions / Restrictions Precautions Precautions: Fall Precaution Comments: dizzy, unsteady Restrictions Weight Bearing Restrictions: No    Mobility  Bed Mobility Overal bed mobility: Needs Assistance Bed Mobility: Rolling;Sidelying to Sit;Sit to  Sidelying Rolling: Supervision Sidelying to sit: Supervision Supine to sit: Mod assist Sit to supine: Min guard Sit to sidelying: Supervision General bed mobility comments: Pt using the railing, moving very slowly.   Transfers Overall transfer level: Needs assistance Equipment used: None;Rolling walker (2 wheeled) Transfers: Sit to/from Stand Sit to Stand: Min assist;Mod assist         General transfer comment: Mod assist without AD, min assist with RW.  Cues for safe hand placement, cues for targeting, however, pt reporting double vision, so difficult to target right now.   Ambulation/Gait Ambulation/Gait assistance: Min guard Gait Distance (Feet): 20 Feet Assistive device: Rolling walker (2 wheeled) Gait Pattern/deviations: Step-through pattern;Staggering left;Staggering right Gait velocity: decreased   General Gait Details: Pt with slow staggering gait pattern, moves better with RW for support, educated re: segmental turning, needs reinforcement for technique.           Balance Overall balance assessment: Needs assistance Sitting-balance support: Feet supported;Bilateral upper extremity supported Sitting balance-Leahy Scale: Poor Sitting balance - Comments: bil UE support (+) sway in sitting, pt feels better with eyes closed.  Postural control: Right lateral lean Standing balance support: Bilateral upper extremity supported Standing balance-Leahy Scale: Poor Standing balance comment: needs external support in standing.            03/01/20 1458  Vestibular Assessment  General Observation eyes closed, room dark  Symptom Behavior  Subjective history of current problem pt had syncopal episode with (+) LOC and hit her head.  (+) SDH.  Orthostatics  taken and were (-), previous episode of vertigo last year, treated with meclazine went away in 1 week.   Type of Dizziness  Imbalance;Spinning;Comment (double vision, nausea)  Frequency of Dizziness constant  Duration of  Dizziness long  Symptom Nature Constant  Aggravating Factors Turning head quickly;Activity in general  Relieving Factors Lying supine;Dark room;Closing eyes;Slow movements  Progression of Symptoms No change since onset  History of similar episodes h/o prior vertigo, treated with meclazine  Oculomotor Exam  Oculomotor Alignment Normal  Spontaneous Direction changing nystagmus  Gaze-induced  Direction changing nystagmus  Smooth Pursuits Saccades (likely nystagmus intrusions vs true saccades)  Saccades Undershoots (3 saccadic corrections)  Vestibulo-Ocular Reflex  VOR 1 Head Only (x 1 viewing) vertical and horizontal are slow and symptomatic, reporting double vision  Orthostatics  Orthostatics Comment see vitals flow sheet, orthostatics negative.                        Cognition Arousal/Alertness: Awake/alert Behavior During Therapy: WFL for tasks assessed/performed Overall Cognitive Status: Impaired/Different from baseline Area of Impairment: Problem solving                             Problem Solving: Slow processing General Comments: Pt a bit slow to process         General Comments General comments (skin integrity, edema, etc.): Pt reports she wears reading glasses, double vision that goes away with one eye (doesn't matter which eye) closed.        Pertinent Vitals/Pain Pain Assessment: Faces Faces Pain Scale: Hurts even more Pain Location: headache Pain Descriptors / Indicators: Headache(neck and shoulder soreness) Pain Intervention(s): Limited activity within patient's tolerance;Monitored during session;Repositioned    Home Living Family/patient expects to be discharged to:: Private residence Living Arrangements: Alone Available Help at Discharge: Family;Available PRN/intermittently Type of Home: House Home Access: Stairs to enter Entrance Stairs-Rails: None Home Layout: One level Home Equipment: Environmental consultant - 2 wheels;Cane - single  point Additional Comments: Plans to go stay with her son at d/c.     Prior Function Level of Independence: Independent with assistive device(s)      Comments: Had been using cane, but only able to tolerate short distances secondary to shortness of breath.    PT Goals (current goals can now be found in the care plan section) Acute Rehab PT Goals Patient Stated Goal: to decrease her dizziness PT Goal Formulation: With patient Time For Goal Achievement: 03/15/20 Potential to Achieve Goals: Good Progress towards PT goals: Progressing toward goals    Frequency    Min 4X/week(vestibular)      PT Plan Current plan remains appropriate       AM-PAC PT "6 Clicks" Mobility   Outcome Measure  Help needed turning from your back to your side while in a flat bed without using bedrails?: A Little Help needed moving from lying on your back to sitting on the side of a flat bed without using bedrails?: A Little Help needed moving to and from a bed to a chair (including a wheelchair)?: A Lot Help needed standing up from a chair using your arms (e.g., wheelchair or bedside chair)?: A Lot Help needed to walk in hospital room?: A Little Help needed climbing 3-5 steps with a railing? : A Lot 6 Click Score: 15    End of Session   Activity Tolerance: Patient limited by pain;Patient limited by fatigue;Other (comment)(limited by dizziness and nausea)  Patient left: in bed;with call bell/phone within reach Nurse Communication: Mobility status;Patient requests pain meds;Other (comment)(requested dizziness meds) PT Visit Diagnosis: Dizziness and giddiness (R42);Other symptoms and signs involving the nervous system (R29.898);Difficulty in walking, not elsewhere classified (R26.2)     Time: [id number] PT Time Calculation (min) (ACUTE ONLY): 43 min  Charges:  $Gait Training: 8-22 mins $Therapeutic Activity: 8-22 mins $Self Care/Home Management: [place], PT, DPT  Acute  Rehabilitation [phone] pager #[phone] office     03/01/2020, 2:53 PM

## 2020-03-01 NOTE — TOC Initial Note (Signed)
Transition of Care [facility]) - Initial/Assessment Note    Patient Details  Name: Alexandra Wolfe MRN: [id number] Date of Birth: 07/20/1946  Transition of [facility] [place]) CM/SW Contact:    Marilu Favre, RN Phone Number: 03/01/2020, 3:32 PM  Clinical Narrative:                 Spoke to patient at bedside. Patient from home alone, however at discharge plans to stay with her son. Son's address is [address], [place], [place]. Provided Medicare.gov list of home health agencies. Patient wanted [facility] , referral accepted by [facility].   Expected Discharge Plan: [place] Barriers to Discharge: Continued Medical Work up   Patient Goals and CMS Choice Patient states their goals for this hospitalization and ongoing recovery are:: to go home CMS Medicare.gov Compare Post Acute Care list provided to:: Patient Choice offered to / list presented to : Patient  Expected Discharge Plan and Services Expected Discharge Plan: [place] Choice: [place] arrangements for the past 2 months: Single Family Home                 DME Arranged: N/A         HH Arranged: PT, OT HH Agency: [place] Date [facility] Agency Contacted: 03/01/20 Time [place]: 1532 Representative spoke with at [place]: Tommi Rumps  Prior Living Arrangements/Services Living arrangements for the past 2 months: [place] with:: Self Patient language and need for interpreter reviewed:: Yes Do you feel safe going back to the place where you live?: Yes      Need for Family Participation in Patient Care: Yes (Comment) Care giver support system in place?: Yes (comment)   Criminal Activity/Legal Involvement Pertinent to Current Situation/Hospitalization: No - Comment as needed  Activities of Daily Living Home Assistive Devices/Equipment: Cane (specify quad or straight), Walker (specify type) ADL Screening (condition at time of  admission) Patient's cognitive ability adequate to safely complete daily activities?: Yes Is the patient deaf or have difficulty hearing?: No Does the patient have difficulty seeing, even when wearing glasses/contacts?: No Does the patient have difficulty concentrating, remembering, or making decisions?: No Patient able to express need for assistance with ADLs?: Yes Does the patient have difficulty dressing or bathing?: No Independently performs ADLs?: Yes (appropriate for developmental age) Does the patient have difficulty walking or climbing stairs?: Yes Weakness of Legs: Both Weakness of Arms/Hands: Right  Permission Sought/Granted   Permission granted to share information with : No              Emotional Assessment Appearance:: Appears stated age Attitude/Demeanor/Rapport: Engaged Affect (typically observed): Accepting Orientation: : Oriented to Self, Oriented to Place, Oriented to  Time, Oriented to Situation Alcohol / Substance Use: Not Applicable Psych Involvement: No (comment)  Admission diagnosis:  Syncope and collapse [R55] Syncope [R55] Acute subdural hematoma ([place]) [S06.5X9A] Patient Active Problem List   Diagnosis Date Noted  . Acute subdural hematoma ([place]) 02/29/2020  . Syncope 02/29/2020  . Mild renal insufficiency 02/29/2020  . Essential hypertension 02/29/2020  . Asthma 02/29/2020  . Chronic dyspnea 01/04/2020  . Allergic rhinitis 01/04/2020  . External otitis of right ear 07/25/2019  . Bilateral foot pain 07/01/2019  . Diabetic neuropathy ([place]) 03/04/2018  . Obstructive sleep apnea 04/04/2016  . Peripheral edema 12/23/2015  . Hypersomnolence 12/22/2015  . Baker's cyst of knee 11/24/2013  . Left knee  pain 11/24/2013  . Dyspnea 11/24/2013  . Rash 05/26/2012  . Bilateral shoulder pain 12/13/2011  . Diabetes mellitus type II, non insulin dependent ([place]) 11/20/2011  . Leukocytosis 11/20/2011  . Preoperative clearance 11/20/2011  . Eustachian tube  dysfunction 05/20/2011  . LBP (low back pain) 05/20/2011  . Encounter for well adult exam with abnormal findings 05/20/2011  . Vertigo 05/20/2011  . Hypothyroidism 02/15/2011  . Other dysphagia 02/15/2011  . URINARY INCONTINENCE 08/23/2009  . Seasonal and perennial allergic rhinitis 04/02/2009  . CONSTIPATION 09/27/2008  . Hyperlipidemia 03/11/2008  . BUNIONS, BILATERAL 12/23/2007  . Depression 09/13/2007  . OSTEOARTHROSIS NOS, LOWER LEG 09/13/2007  . COLONIC POLYPS, HX OF 09/13/2007   PCP:  Biagio Borg, MD Pharmacy:   [facility] [place], [address] [place] [address] Phone: [phone] Fax: [phone]  [id number] - [place], [address] [place] [address] Phone: [phone] Fax: [phone]     Social Determinants of Health (SDOH) Interventions    Readmission Risk Interventions No flowsheet data found.

## 2020-03-01 NOTE — Evaluation (Signed)
Physical Therapy Evaluation Patient Details Name: Alexandra Wolfe MRN: [id number] DOB: [birth year] Today's Date: 03/01/2020   History of Present Illness  Pt is a 74 y/o female admitted secondary to syncopal episode with fall from shower. Found to have subdural hematoma in L falx and L tentorium. Pt also with dizziness. PMH includes DM, HTN, asthma, and OSA with CPAP intolerance.   Clinical Impression  Pt admitted secondary to problem above with deficits below. Pt requiring mod A to sit at EOB this session. Did note nystagmus in both eyes upon sitting. Unable to tolerate further mobility secondary to dizziness. Orthostatics negative when transitioning from supine to sitting. Feel pt will require further vestibular evaluation prior to d/c. Will continue to follow acutely to maximize functional mobility independence and safety.     Follow Up Recommendations Other (comment)(TBD pending vestibular evaluation )    Equipment Recommendations  Other (comment)(TBD)    Recommendations for Other Services       Precautions / Restrictions Precautions Precautions: Fall Restrictions Weight Bearing Restrictions: No      Mobility  Bed Mobility Overal bed mobility: Needs Assistance Bed Mobility: Supine to Sit;Sit to Supine     Supine to sit: Mod assist Sit to supine: Min guard   General bed mobility comments: Mod A for trunk assist to come to sitting. Pt with increased dizziness in sitting with nystagmus noted. Orthostatics negative. Min guard for safety to return to sitting.   Transfers                    Ambulation/Gait                Stairs            Wheelchair Mobility    Modified Rankin (Stroke Patients Only)       Balance Overall balance assessment: Needs assistance Sitting-balance support: Bilateral upper extremity supported;Feet supported Sitting balance-Leahy Scale: Poor Sitting balance - Comments: reliant on BUE support secondary to dizziness.                                       Pertinent Vitals/Pain Pain Assessment: Faces Faces Pain Scale: Hurts little more Pain Location: headache Pain Descriptors / Indicators: Headache Pain Intervention(s): Monitored during session;Limited activity within patient's tolerance;Repositioned    Home Living Family/patient expects to be discharged to:: Private residence Living Arrangements: Alone Available Help at Discharge: Family;Available PRN/intermittently Type of Home: House Home Access: Stairs to enter Entrance Stairs-Rails: None Entrance Stairs-Number of Steps: 2 Home Layout: One level Home Equipment: Walker - 2 wheels;Cane - single point Additional Comments: Plans to go stay with her son at d/c.     Prior Function Level of Independence: Independent with assistive device(s)         Comments: Had been using cane, but only able to tolerate short distances secondary to shortness of breath.      Hand Dominance        Extremity/Trunk Assessment   Upper Extremity Assessment Upper Extremity Assessment: Defer to OT evaluation    Lower Extremity Assessment Lower Extremity Assessment: Generalized weakness    Cervical / Trunk Assessment Cervical / Trunk Assessment: Normal  Communication   Communication: No difficulties  Cognition Arousal/Alertness: Awake/alert Behavior During Therapy: WFL for tasks assessed/performed Overall Cognitive Status: Within Functional Limits for tasks assessed  General Comments General comments (skin integrity, edema, etc.): Educated about nystagmus and recommendations for vestibular PT to come see pt; notified MD as well. Pt's son and pt also voiced concerns about pt being very short of breath during short distances as well.     Exercises     Assessment/Plan    PT Assessment Patient needs continued PT services  PT Problem List Decreased strength;Decreased balance;Decreased  activity tolerance;Decreased mobility       PT Treatment Interventions Stair training;Gait training;DME instruction;Functional mobility training;Therapeutic activities;Therapeutic exercise;Balance training;Neuromuscular re-education;Patient/family education    PT Goals (Current goals can be found in the Care Plan section)  Acute Rehab PT Goals Patient Stated Goal: To feel better PT Goal Formulation: With patient Time For Goal Achievement: 03/15/20 Potential to Achieve Goals: Good    Frequency Min 3X/week   Barriers to discharge        Co-evaluation               AM-PAC PT "6 Clicks" Mobility  Outcome Measure Help needed turning from your back to your side while in a flat bed without using bedrails?: A Lot Help needed moving from lying on your back to sitting on the side of a flat bed without using bedrails?: A Lot Help needed moving to and from a bed to a chair (including a wheelchair)?: Total Help needed standing up from a chair using your arms (e.g., wheelchair or bedside chair)?: Total Help needed to walk in hospital room?: Total Help needed climbing 3-5 steps with a railing? : Total 6 Click Score: 8    End of Session   Activity Tolerance: Treatment limited secondary to medical complications (Comment)(dizziness) Patient left: in bed;with call bell/phone within reach;with family/visitor present Nurse Communication: Mobility status PT Visit Diagnosis: Other abnormalities of gait and mobility (R26.89);Difficulty in walking, not elsewhere classified (R26.2);Dizziness and giddiness (R42)    Time: [id number] PT Time Calculation (min) (ACUTE ONLY): 19 min   Charges:   PT Evaluation $PT Eval Moderate Complexity: 1 Mod          Reuel [place], PT, DPT  Acute Rehabilitation Services  Pager: [phone] Office: [phone]   Rudean Hitt 03/01/2020, 1:32 PM

## 2020-03-01 NOTE — Care Management Obs Status (Signed)
[place] NOTIFICATION   Patient Details  Name: Alexandra Wolfe MRN: [id number] Date of Birth: 13-Sep-1946   Medicare Observation Status Notification Given:  Yes    Marilu Favre, RN 03/01/2020, 3:57 PM

## 2020-03-01 NOTE — Progress Notes (Signed)
Progress Note    Alexandra Wolfe  [id number] DOB: May 13, 1946  DOA: 02/29/2020 PCP: Biagio Borg, MD    Brief Narrative:     Medical records reviewed and are as summarized below:  ESTEPHANIE Wolfe is an 74 y.o. female with medical history significant for type 2 diabetes mellitus, hypertension, asthma, hypothyroidism, and OSA with CPAP intolerance, now presenting to the emergency department after a syncopal episode with head injury.  Patient was taken to shower this afternoon when she suffered a syncopal episode.  She reports that she has been feeling a little bit weak past couple weeks, but has not had lightheadedness, chest pain, palpitations.  Assessment/Plan:   Principal Problem:   Acute subdural hematoma (HCC) Active Problems:   Hypothyroidism   Diabetes mellitus type II, non insulin dependent (HCC)   Leukocytosis   Syncope   Mild renal insufficiency   Essential hypertension   Asthma    Acute subdural hematoma  - Presents after syncope in shower with scalp laceration and acute left subfalcine SDH on CT  - Neurosurgery consulting and much appreciated, recommending MAP 70-90, Keppra 500 mg BID x7 days, repeat non-contrast head CT in am, hold ASA 2 wks -Repeat CT on 3/24 was stable  Syncope  - Presents after a syncopal episode in the shower  - She intentionally lost 5 lbs recently, has been feeling a little weak in general, and this may have been related to orthostasis  - Orthostatic vital signs pending -Continue telemetry -Echo: Shows low normal EF, outpatient cardiology follow-up if needed: Troponins negative  Vertigo/BPPV -Patient has had in the past -Patient states she did not do exercises but instead was treated with meclizine with improvement -We will get PT eval and start meclizine   Mild renal insufficiency  - SCr is 1.05 in ED, up from 0.80 in January 2021  - She recently started a new diet, has lost 5 lbs, and this may be acute prerenal azotemia    Asthma  - Patient reports months of DOE with occasional non-productive cough but no recent wheezing  - Continue ICS/LABA and as-needed albuterol    Type II DM   - A1c was 6.3% in January 2021  - Check CBGs and use a low-intensity SSI with Novolog as needed for now    Hypertension  - Keep MAP 70-90 per NSG recommendations -PRNs ordered   Leukocytosis  - Appears chronic, no infectious s/s, culture if febrile    obesity Body mass index is 39.22 kg/m.   Family Communication/Anticipated D/C date and plan/Code Status   DVT prophylaxis: SCDs Code Status: Full Code.  Family Communication: Family at bedside Disposition Plan: Patient not safe for discharge.  Patient has vertiginous symptoms that make it impossible for her to walk.  She lives alone and is a high fall risk   Medical Consultants:    Neurosurgery     Subjective:   Complaining of nausea and dizziness.  She states the dizziness started after she fell  Objective:    Vitals:   02/29/20 2202 02/29/20 2346 03/01/20 0000 03/01/20 0209  BP:  (!) 158/59 (!) 119/44 (!) 153/83  Pulse:  72 83 87  Resp:  18 15 16   Temp:    97.8 F (36.6 C)  TempSrc:    Oral  SpO2: 97% 94% 95% 100%  Weight:      Height:        Intake/Output Summary (Last 24 hours) at 03/01/2020 1219 Last data filed at  03/01/2020 0600 Gross per 24 hour  Intake 287.2 ml  Output --  Net 287.2 ml   Filed Weights   02/29/20 1550  Weight: 110.2 kg    Exam: In bed, laying still with eyes closed Regular rate and rhythm No increased work of breathing Alert  Data Reviewed:   I have personally reviewed following labs and imaging studies:  Labs: Labs show the following:   Basic Metabolic Panel: Recent Labs  Lab 02/29/20 1603 03/01/20 0259  NA 140 142  K 3.7 3.5  CL 103 107  CO2 19* 21*  GLUCOSE 140* 122*  BUN 29* 22  CREATININE 1.05* 0.93  CALCIUM 9.6 9.4   GFR Estimated Creatinine Clearance: 66.8 mL/min (by C-G formula  based on SCr of 0.93 mg/dL). Liver Function Tests: Recent Labs  Lab 03/01/20 0259  AST 35  ALT 42  ALKPHOS 70  BILITOT 0.8  PROT 6.5  ALBUMIN 4.0   No results for input(s): LIPASE, AMYLASE in the last 168 hours. No results for input(s): AMMONIA in the last 168 hours. Coagulation profile Recent Labs  Lab 02/29/20 1632  INR 1.0    CBC: Recent Labs  Lab 02/29/20 1603 03/01/20 0259  WBC 13.4* 14.4*  NEUTROABS  --  8.1*  HGB 14.7 14.2  HCT 45.1 41.9  MCV 90.7 88.0  PLT 368 341   Cardiac Enzymes: No results for input(s): CKTOTAL, CKMB, CKMBINDEX, TROPONINI in the last 168 hours. BNP (last 3 results) No results for input(s): PROBNP in the last 8760 hours. CBG: Recent Labs  Lab 03/01/20 0045 03/01/20 0415 03/01/20 0744 03/01/20 1109  GLUCAP 95 114* 128* 129*   D-Dimer: No results for input(s): DDIMER in the last 72 hours. Hgb A1c: No results for input(s): HGBA1C in the last 72 hours. Lipid Profile: No results for input(s): CHOL, HDL, LDLCALC, TRIG, CHOLHDL, LDLDIRECT in the last 72 hours. Thyroid function studies: No results for input(s): TSH, T4TOTAL, T3FREE, THYROIDAB in the last 72 hours.  Invalid input(s): FREET3 Anemia work up: No results for input(s): VITAMINB12, FOLATE, FERRITIN, TIBC, IRON, RETICCTPCT in the last 72 hours. Sepsis Labs: Recent Labs  Lab 02/29/20 1603 03/01/20 0259  WBC 13.4* 14.4*    Microbiology Recent Results (from the past 240 hour(s))  SARS CORONAVIRUS 2 (TAT 6-24 HRS) Nasopharyngeal Nasopharyngeal Swab     Status: None   Collection Time: 02/29/20  9:07 PM   Specimen: Nasopharyngeal Swab  Result Value Ref Range Status   SARS Coronavirus 2 NEGATIVE NEGATIVE Final    Comment: (NOTE) SARS-CoV-2 target nucleic acids are NOT DETECTED. The SARS-CoV-2 RNA is generally detectable in upper and lower respiratory specimens during the acute phase of infection. Negative results do not preclude SARS-CoV-2 infection, do not rule  out co-infections with other pathogens, and should not be used as the sole basis for treatment or other patient management decisions. Negative results must be combined with clinical observations, patient history, and epidemiological information. The expected result is Negative. Fact Sheet for Patients: SugarRoll.be Fact Sheet for Healthcare Providers: https://www.woods-mathews.com/ This test is not yet approved or cleared by the [place] FDA and  has been authorized for detection and/or diagnosis of SARS-CoV-2 by FDA under an Emergency Use Authorization (EUA). This EUA will remain  in effect (meaning this test can be used) for the duration of the COVID-19 declaration under Section 56 4(b)(1) of the Act, 21 U.S.C. section 360bbb-3(b)(1), unless the authorization is terminated or revoked sooner. Performed at [facility], [place]  [address], [place], [place] [address]     Procedures and diagnostic studies:  CT HEAD WO CONTRAST  Result Date: 03/01/2020 CLINICAL DATA:  Head trauma, intracranial venous injury suspected. Additional history provided: Patient admitted yesterday status post fall, CT showed small falcine subdural hematoma, patient very dizzy today, reports occipital headache EXAM: CT HEAD WITHOUT CONTRAST TECHNIQUE: Contiguous axial images were obtained from the base of the skull through the vertex without intravenous contrast. COMPARISON:  Head CT 02/29/2020 FINDINGS: Brain: An acute subdural hemorrhage layering along the left aspect of the falx and left tentorium is unchanged as compared to head CT 02/29/2020. The hemorrhage again measures up to 10 mm in greatest thickness (series 3, image 24). No interval hemorrhage is demonstrated. No evidence of intracranial mass. No midline shift. No hydrocephalus. Vascular: No hyperdense vessel.  Atherosclerotic calcifications. Skull: Normal. Negative for fracture or focal lesion. Sinuses/Orbits:  Visualized orbits demonstrate no acute abnormality. Trace ethmoid sinus mucosal thickening. No significant mastoid effusion. Other: Redemonstrated left parietal scalp hematoma. There is also a small amount of subcutaneous gas at this site suggestive of laceration. IMPRESSION: Stable acute subdural hemorrhage layering along the left aspect of the falx and left tentorium, again measuring up to 10 mm in thickness. No significant mass effect. Redemonstrated left posterior scalp hematoma/laceration. Electronically Signed   By: Kellie Simmering DO   On: 03/01/2020 08:59   CT HEAD WO CONTRAST  Result Date: 02/29/2020 CLINICAL DATA:  Syncopal episode, hit head EXAM: CT HEAD WITHOUT CONTRAST TECHNIQUE: Contiguous axial images were obtained from the base of the skull through the vertex without intravenous contrast. COMPARISON:  None. FINDINGS: Brain: Acute subdural hematoma is present along the left aspect of the falx measuring up to 10 mm in thickness. There is no significant associated mass effect. Prominence of the ventricles and sulci reflects minor generalized parenchymal volume loss. Gray-white differentiation is preserved. Vascular: No hyperdense vessel or unexpected calcification. Skull: Calvarium is unremarkable. Sinuses/Orbits: No acute finding. Other: Posterior scalp soft tissue swelling. IMPRESSION: Acute left falcine subdural hematoma.  No significant mass effect. These results were called by telephone at the time of interpretation on 02/29/2020 at 5:01 pm to provider Veryl Speak , who verbally acknowledged these results. Electronically Signed   By: Macy Mis M.D.   On: 02/29/2020 17:04   CT Cervical Spine Wo Contrast  Result Date: 02/29/2020 CLINICAL DATA:  Syncope EXAM: CT CERVICAL SPINE WITHOUT CONTRAST TECHNIQUE: Multidetector CT imaging of the cervical spine was performed without intravenous contrast. Multiplanar CT image reconstructions were also generated. COMPARISON:  None. FINDINGS: Alignment:  No significant listhesis. Skull base and vertebrae: No acute cervical spine fracture. Vertebral body heights are maintained apart from degenerative endplate irregularity. Soft tissues and spinal canal: No prevertebral fluid or swelling. No visible canal hematoma. Disc levels: Multilevel degenerative changes are present including disc space narrowing, endplate osteophytes, and facet and uncovertebral hypertrophy. Upper chest: Negative. Other: Mild calcified plaque at the left ICA origin. IMPRESSION: No acute cervical spine fracture. Electronically Signed   By: Macy Mis M.D.   On: 02/29/2020 17:07   Portable Chest 1 View  Result Date: 02/29/2020 CLINICAL DATA:  Syncope. EXAM: PORTABLE CHEST 1 VIEW COMPARISON:  January 04, 2020 FINDINGS: The heart size and mediastinal contours are within normal limits. There is moderate severity calcification of the aortic arch. Both lungs are clear. Degenerative changes are seen involving both shoulders. The visualized skeletal structures are otherwise unremarkable. IMPRESSION: No active disease. Electronically Signed   By: Hoover Browns  Houston M.D.   On: 02/29/2020 20:52   DG Shoulder Right Portable  Result Date: 02/29/2020 CLINICAL DATA:  Fall in shower. Right shoulder pain. EXAM: PORTABLE RIGHT SHOULDER COMPARISON:  None. FINDINGS: Prominent spurring of the humeral head and glenoid with prominent loss of articular space in the glenohumeral joint compatible with severe osteoarthritis. No fracture or malalignment is identified. Subacromial morphology is type 2 (curved). IMPRESSION: Severe glenohumeral osteoarthritis. No acute findings. Electronically Signed   By: Van Clines M.D.   On: 02/29/2020 18:22   ECHOCARDIOGRAM COMPLETE  Result Date: 03/01/2020    ECHOCARDIOGRAM REPORT   Patient Name:   CHANDI ZUFALL Date of Exam: 03/01/2020 Medical Rec #:  [id number]        Height:       66.0 in Accession #:    [id number]       Weight:       243.0 lb Date of Birth:   09-15-1946        BSA:          2.172 m Patient Age:    71 years         BP:           153/83 mmHg Patient Gender: F                HR:           81 bpm. Exam Location:  Inpatient Procedure: 2D Echo Indications:    Syncope R55  History:        Patient has prior history of Echocardiogram examinations, most                 recent 12/16/2013. Risk Factors:Dyslipidemia, Diabetes and Former                 Smoker.  Sonographer:    Mikki Santee RDCS (AE) Referring Phys: [id number] [place]  1. Mid and basal posterior lateral wall hypokinesis . Left ventricular ejection fraction, by estimation, is 50 to 55%. The left ventricle has low normal function. The left ventricle demonstrates regional wall motion abnormalities (see scoring diagram/findings for description). Left ventricular diastolic parameters were normal.  2. Right ventricular systolic function is normal. The right ventricular size is normal.  3. The mitral valve is normal in structure. Trivial mitral valve regurgitation. No evidence of mitral stenosis.  4. The aortic valve was not well visualized. Aortic valve regurgitation is not visualized. No aortic stenosis is present.  5. The inferior vena cava is normal in size with greater than 50% respiratory variability, suggesting right atrial pressure of 3 mmHg. FINDINGS  Left Ventricle: Mid and basal posterior lateral wall hypokinesis. Left ventricular ejection fraction, by estimation, is 50 to 55%. The left ventricle has low normal function. The left ventricle demonstrates regional wall motion abnormalities. The left ventricular internal cavity size was normal in size. There is no left ventricular hypertrophy. Left ventricular diastolic parameters were normal. Right Ventricle: The right ventricular size is normal. No increase in right ventricular wall thickness. Right ventricular systolic function is normal. Left Atrium: Left atrial size was normal in size. Right Atrium: Right atrial size was normal  in size. Pericardium: There is no evidence of pericardial effusion. Mitral Valve: The mitral valve is normal in structure. There is mild thickening of the mitral valve leaflet(s). Normal mobility of the mitral valve leaflets. Trivial mitral valve regurgitation. No evidence of mitral valve stenosis. Tricuspid Valve: The tricuspid valve is normal in structure. Tricuspid valve  regurgitation is trivial. No evidence of tricuspid stenosis. Aortic Valve: The aortic valve was not well visualized. Aortic valve regurgitation is not visualized. No aortic stenosis is present. Pulmonic Valve: The pulmonic valve was normal in structure. Pulmonic valve regurgitation is not visualized. No evidence of pulmonic stenosis. Aorta: The aortic root is normal in size and structure. Venous: The inferior vena cava is normal in size with greater than 50% respiratory variability, suggesting right atrial pressure of 3 mmHg. IAS/Shunts: No atrial level shunt detected by color flow Doppler.  LEFT VENTRICLE PLAX 2D LVIDd:         4.30 cm  Diastology LVIDs:         2.50 cm  LV e' lateral:   7.51 cm/s LV PW:         0.90 cm  LV E/e' lateral: 8.0 LV IVS:        0.90 cm  LV e' medial:    6.53 cm/s LVOT diam:     2.20 cm  LV E/e' medial:  9.2 LV SV:         81 LV SV Index:   37 LVOT Area:     3.80 cm  RIGHT VENTRICLE RV S prime:     13.40 cm/s TAPSE (M-mode): 2.0 cm LEFT ATRIUM             Index       RIGHT ATRIUM           Index LA diam:        3.60 cm 1.66 cm/m  RA Area:     11.20 cm LA Vol (A2C):   30.3 ml 13.95 ml/m RA Volume:   24.20 ml  11.14 ml/m LA Vol (A4C):   45.0 ml 20.71 ml/m LA Biplane Vol: 41.0 ml 18.87 ml/m  AORTIC VALVE LVOT Vmax:   94.20 cm/s LVOT Vmean:  66.800 cm/s LVOT VTI:    0.213 m  AORTA Ao Root diam: 3.70 cm MITRAL VALVE MV Area (PHT): 3.60 cm    SHUNTS MV Decel Time: 211 msec    Systemic VTI:  0.21 m MV E velocity: 60.00 cm/s  Systemic Diam: 2.20 cm MV A velocity: 85.30 cm/s MV E/A ratio:  0.70 Jenkins Rouge MD  Electronically signed by Jenkins Rouge MD Signature Date/Time: 03/01/2020/11:55:17 AM    Final     Medications:    insulin aspart  0-9 Units Subcutaneous Q4H   mometasone-formoterol  2 puff Inhalation BID   sodium chloride flush  3 mL Intravenous Q12H   Continuous Infusions:  levETIRAcetam 500 mg (03/01/20 0531)     LOS: 0 days   Geradine Girt  [facility]   How to contact the [facility] Attending or Consulting provider [place] or covering provider during after hours [place], for this patient?  1. Check the care team in [facility] and look for a) attending/consulting [facility] provider listed and b) the [facility] team listed 2. Log into [email or web address] and use [place] universal password to access. If you do not have the password, please contact the hospital operator. 3. Locate the [facility] provider you are looking for under [facility] and page to a number that you can be directly reached. 4. If you still have difficulty reaching the provider, please page the [facility] (Director on Call) for the Hospitalists listed on amion for assistance.  03/01/2020, 12:19 PM

## 2020-03-01 NOTE — Progress Notes (Signed)
Subjective: Patient reports leg numbness has improved.  C/o nausea and dizziness  Objective: Vital signs in last 24 hours: Temp:  [97.2 F (36.2 C)-97.8 F (36.6 C)] 97.6 F (36.4 C) (03/24 1255) Pulse Rate:  [65-87] 74 (03/24 1255) Resp:  [15-26] 18 (03/24 1255) BP: (117-167)/(44-87) 117/53 (03/24 1255) SpO2:  [94 %-100 %] 99 % (03/24 1255) Weight:  [110.2 kg] 110.2 kg (03/23 1550)  Intake/Output from previous day: 03/23 0701 - 03/24 0700 In: 287.2 [I.V.:187.2; IV Piggyback:100] Out: -  Intake/Output this shift: No intake/output data recorded.  Awake but drowsy, O x 3 PERRL FC x 4 5/5 strength in LEs  Lab Results: Recent Labs    02/29/20 1603 03/01/20 0259  WBC 13.4* 14.4*  HGB 14.7 14.2  HCT 45.1 41.9  PLT 368 341   BMET Recent Labs    02/29/20 1603 03/01/20 0259  NA 140 142  K 3.7 3.5  CL 103 107  CO2 19* 21*  GLUCOSE 140* 122*  BUN 29* 22  CREATININE 1.05* 0.93  CALCIUM 9.6 9.4    Studies/Results: CT HEAD WO CONTRAST  Result Date: 03/01/2020 CLINICAL DATA:  Head trauma, intracranial venous injury suspected. Additional history provided: Patient admitted yesterday status post fall, CT showed small falcine subdural hematoma, patient very dizzy today, reports occipital headache EXAM: CT HEAD WITHOUT CONTRAST TECHNIQUE: Contiguous axial images were obtained from the base of the skull through the vertex without intravenous contrast. COMPARISON:  Head CT 02/29/2020 FINDINGS: Brain: An acute subdural hemorrhage layering along the left aspect of the falx and left tentorium is unchanged as compared to head CT 02/29/2020. The hemorrhage again measures up to 10 mm in greatest thickness (series 3, image 24). No interval hemorrhage is demonstrated. No evidence of intracranial mass. No midline shift. No hydrocephalus. Vascular: No hyperdense vessel.  Atherosclerotic calcifications. Skull: Normal. Negative for fracture or focal lesion. Sinuses/Orbits: Visualized orbits  demonstrate no acute abnormality. Trace ethmoid sinus mucosal thickening. No significant mastoid effusion. Other: Redemonstrated left parietal scalp hematoma. There is also a small amount of subcutaneous gas at this site suggestive of laceration. IMPRESSION: Stable acute subdural hemorrhage layering along the left aspect of the falx and left tentorium, again measuring up to 10 mm in thickness. No significant mass effect. Redemonstrated left posterior scalp hematoma/laceration. Electronically Signed   By: Kellie Simmering DO   On: 03/01/2020 08:59   CT HEAD WO CONTRAST  Result Date: 02/29/2020 CLINICAL DATA:  Syncopal episode, hit head EXAM: CT HEAD WITHOUT CONTRAST TECHNIQUE: Contiguous axial images were obtained from the base of the skull through the vertex without intravenous contrast. COMPARISON:  None. FINDINGS: Brain: Acute subdural hematoma is present along the left aspect of the falx measuring up to 10 mm in thickness. There is no significant associated mass effect. Prominence of the ventricles and sulci reflects minor generalized parenchymal volume loss. Gray-white differentiation is preserved. Vascular: No hyperdense vessel or unexpected calcification. Skull: Calvarium is unremarkable. Sinuses/Orbits: No acute finding. Other: Posterior scalp soft tissue swelling. IMPRESSION: Acute left falcine subdural hematoma.  No significant mass effect. These results were called by telephone at the time of interpretation on 02/29/2020 at 5:01 pm to provider Veryl Speak , who verbally acknowledged these results. Electronically Signed   By: Macy Mis M.D.   On: 02/29/2020 17:04   CT Cervical Spine Wo Contrast  Result Date: 02/29/2020 CLINICAL DATA:  Syncope EXAM: CT CERVICAL SPINE WITHOUT CONTRAST TECHNIQUE: Multidetector CT imaging of the cervical spine was performed without intravenous  contrast. Multiplanar CT image reconstructions were also generated. COMPARISON:  None. FINDINGS: Alignment: No significant  listhesis. Skull base and vertebrae: No acute cervical spine fracture. Vertebral body heights are maintained apart from degenerative endplate irregularity. Soft tissues and spinal canal: No prevertebral fluid or swelling. No visible canal hematoma. Disc levels: Multilevel degenerative changes are present including disc space narrowing, endplate osteophytes, and facet and uncovertebral hypertrophy. Upper chest: Negative. Other: Mild calcified plaque at the left ICA origin. IMPRESSION: No acute cervical spine fracture. Electronically Signed   By: Macy Mis M.D.   On: 02/29/2020 17:07   Portable Chest 1 View  Result Date: 02/29/2020 CLINICAL DATA:  Syncope. EXAM: PORTABLE CHEST 1 VIEW COMPARISON:  January 04, 2020 FINDINGS: The heart size and mediastinal contours are within normal limits. There is moderate severity calcification of the aortic arch. Both lungs are clear. Degenerative changes are seen involving both shoulders. The visualized skeletal structures are otherwise unremarkable. IMPRESSION: No active disease. Electronically Signed   By: Virgina Norfolk M.D.   On: 02/29/2020 20:52   DG Shoulder Right Portable  Result Date: 02/29/2020 CLINICAL DATA:  Fall in shower. Right shoulder pain. EXAM: PORTABLE RIGHT SHOULDER COMPARISON:  None. FINDINGS: Prominent spurring of the humeral head and glenoid with prominent loss of articular space in the glenohumeral joint compatible with severe osteoarthritis. No fracture or malalignment is identified. Subacromial morphology is type 2 (curved). IMPRESSION: Severe glenohumeral osteoarthritis. No acute findings. Electronically Signed   By: Van Clines M.D.   On: 02/29/2020 18:22   ECHOCARDIOGRAM COMPLETE  Result Date: 03/01/2020    ECHOCARDIOGRAM REPORT   Patient Name:   Alexandra Wolfe Date of Exam: 03/01/2020 Medical Rec #:  [id number]        Height:       66.0 in Accession #:    [id number]       Weight:       243.0 lb Date of Birth:  Aug 01, 1946         BSA:          2.172 m Patient Age:    74 years         BP:           153/83 mmHg Patient Gender: F                HR:           81 bpm. Exam Location:  Inpatient Procedure: 2D Echo Indications:    Syncope R55  History:        Patient has prior history of Echocardiogram examinations, most                 recent 12/16/2013. Risk Factors:Dyslipidemia, Diabetes and Former                 Smoker.  Sonographer:    Mikki Santee RDCS (AE) Referring Phys: [id number] [place]  1. Mid and basal posterior lateral wall hypokinesis . Left ventricular ejection fraction, by estimation, is 50 to 55%. The left ventricle has low normal function. The left ventricle demonstrates regional wall motion abnormalities (see scoring diagram/findings for description). Left ventricular diastolic parameters were normal.  2. Right ventricular systolic function is normal. The right ventricular size is normal.  3. The mitral valve is normal in structure. Trivial mitral valve regurgitation. No evidence of mitral stenosis.  4. The aortic valve was not well visualized. Aortic valve regurgitation is not visualized. No aortic stenosis is present.  5.  The inferior vena cava is normal in size with greater than 50% respiratory variability, suggesting right atrial pressure of 3 mmHg. FINDINGS  Left Ventricle: Mid and basal posterior lateral wall hypokinesis. Left ventricular ejection fraction, by estimation, is 50 to 55%. The left ventricle has low normal function. The left ventricle demonstrates regional wall motion abnormalities. The left ventricular internal cavity size was normal in size. There is no left ventricular hypertrophy. Left ventricular diastolic parameters were normal. Right Ventricle: The right ventricular size is normal. No increase in right ventricular wall thickness. Right ventricular systolic function is normal. Left Atrium: Left atrial size was normal in size. Right Atrium: Right atrial size was normal in size.  Pericardium: There is no evidence of pericardial effusion. Mitral Valve: The mitral valve is normal in structure. There is mild thickening of the mitral valve leaflet(s). Normal mobility of the mitral valve leaflets. Trivial mitral valve regurgitation. No evidence of mitral valve stenosis. Tricuspid Valve: The tricuspid valve is normal in structure. Tricuspid valve regurgitation is trivial. No evidence of tricuspid stenosis. Aortic Valve: The aortic valve was not well visualized. Aortic valve regurgitation is not visualized. No aortic stenosis is present. Pulmonic Valve: The pulmonic valve was normal in structure. Pulmonic valve regurgitation is not visualized. No evidence of pulmonic stenosis. Aorta: The aortic root is normal in size and structure. Venous: The inferior vena cava is normal in size with greater than 50% respiratory variability, suggesting right atrial pressure of 3 mmHg. IAS/Shunts: No atrial level shunt detected by color flow Doppler.  LEFT VENTRICLE PLAX 2D LVIDd:         4.30 cm  Diastology LVIDs:         2.50 cm  LV e' lateral:   7.51 cm/s LV PW:         0.90 cm  LV E/e' lateral: 8.0 LV IVS:        0.90 cm  LV e' medial:    6.53 cm/s LVOT diam:     2.20 cm  LV E/e' medial:  9.2 LV SV:         81 LV SV Index:   37 LVOT Area:     3.80 cm  RIGHT VENTRICLE RV S prime:     13.40 cm/s TAPSE (M-mode): 2.0 cm LEFT ATRIUM             Index       RIGHT ATRIUM           Index LA diam:        3.60 cm 1.66 cm/m  RA Area:     11.20 cm LA Vol (A2C):   30.3 ml 13.95 ml/m RA Volume:   24.20 ml  11.14 ml/m LA Vol (A4C):   45.0 ml 20.71 ml/m LA Biplane Vol: 41.0 ml 18.87 ml/m  AORTIC VALVE LVOT Vmax:   94.20 cm/s LVOT Vmean:  66.800 cm/s LVOT VTI:    0.213 m  AORTA Ao Root diam: 3.70 cm MITRAL VALVE MV Area (PHT): 3.60 cm    SHUNTS MV Decel Time: 211 msec    Systemic VTI:  0.21 m MV E velocity: 60.00 cm/s  Systemic Diam: 2.20 cm MV A velocity: 85.30 cm/s MV E/A ratio:  0.70 Jenkins Rouge MD Electronically  signed by Jenkins Rouge MD Signature Date/Time: 03/01/2020/11:55:17 AM    Final     Assessment/Plan: S/p fall with falcotentorial SDH, stable on repeat imaging - recommend 7 days total Keppra - Can liberalize MAPs to 70-100 if needed -  PT/OT/Speech for post-concussive symptoms - can restart aspirin in 1 week, f/u in neurosurgery clinic in 2 weeks   Vallarie Mare 03/01/2020, 2:12 PM

## 2020-03-01 NOTE — Progress Notes (Signed)
  Echocardiogram 2D Echocardiogram has been performed.  Jennette Dubin 03/01/2020, 11:40 AM

## 2020-03-02 ENCOUNTER — Observation Stay (HOSPITAL_COMMUNITY): Payer: Medicare Other

## 2020-03-02 DIAGNOSIS — S79912A Unspecified injury of left hip, initial encounter: Secondary | ICD-10-CM | POA: Diagnosis not present

## 2020-03-02 DIAGNOSIS — N289 Disorder of kidney and ureter, unspecified: Secondary | ICD-10-CM | POA: Diagnosis not present

## 2020-03-02 DIAGNOSIS — S065X0A Traumatic subdural hemorrhage without loss of consciousness, initial encounter: Secondary | ICD-10-CM | POA: Diagnosis not present

## 2020-03-02 DIAGNOSIS — E119 Type 2 diabetes mellitus without complications: Secondary | ICD-10-CM | POA: Diagnosis not present

## 2020-03-02 DIAGNOSIS — S065X9A Traumatic subdural hemorrhage with loss of consciousness of unspecified duration, initial encounter: Secondary | ICD-10-CM | POA: Diagnosis not present

## 2020-03-02 DIAGNOSIS — R42 Dizziness and giddiness: Secondary | ICD-10-CM | POA: Diagnosis not present

## 2020-03-02 DIAGNOSIS — G4733 Obstructive sleep apnea (adult) (pediatric): Secondary | ICD-10-CM | POA: Diagnosis not present

## 2020-03-02 DIAGNOSIS — S8991XA Unspecified injury of right lower leg, initial encounter: Secondary | ICD-10-CM | POA: Diagnosis not present

## 2020-03-02 DIAGNOSIS — S8992XA Unspecified injury of left lower leg, initial encounter: Secondary | ICD-10-CM | POA: Diagnosis not present

## 2020-03-02 LAB — URINALYSIS, ROUTINE W REFLEX MICROSCOPIC
Bilirubin Urine: NEGATIVE
Glucose, UA: >=500 mg/dL — AB
Hgb urine dipstick: NEGATIVE
Ketones, ur: NEGATIVE mg/dL
Leukocytes,Ua: NEGATIVE
Nitrite: NEGATIVE
Protein, ur: NEGATIVE mg/dL
Specific Gravity, Urine: 1.016 (ref 1.005–1.030)
pH: 5 (ref 5.0–8.0)

## 2020-03-02 LAB — GLUCOSE, CAPILLARY
Glucose-Capillary: 103 mg/dL — ABNORMAL HIGH (ref 70–99)
Glucose-Capillary: 107 mg/dL — ABNORMAL HIGH (ref 70–99)
Glucose-Capillary: 138 mg/dL — ABNORMAL HIGH (ref 70–99)
Glucose-Capillary: 90 mg/dL (ref 70–99)

## 2020-03-02 IMAGING — CR DG HIP (WITH OR WITHOUT PELVIS) 2-3V*L*
3 series · 3 of 3 positions shown · non-contrast
Comparison: None.

CLINICAL DATA: Fall

EXAM:
DG HIP (WITH OR WITHOUT PELVIS) 2-3V LEFT

[pelvis ap]
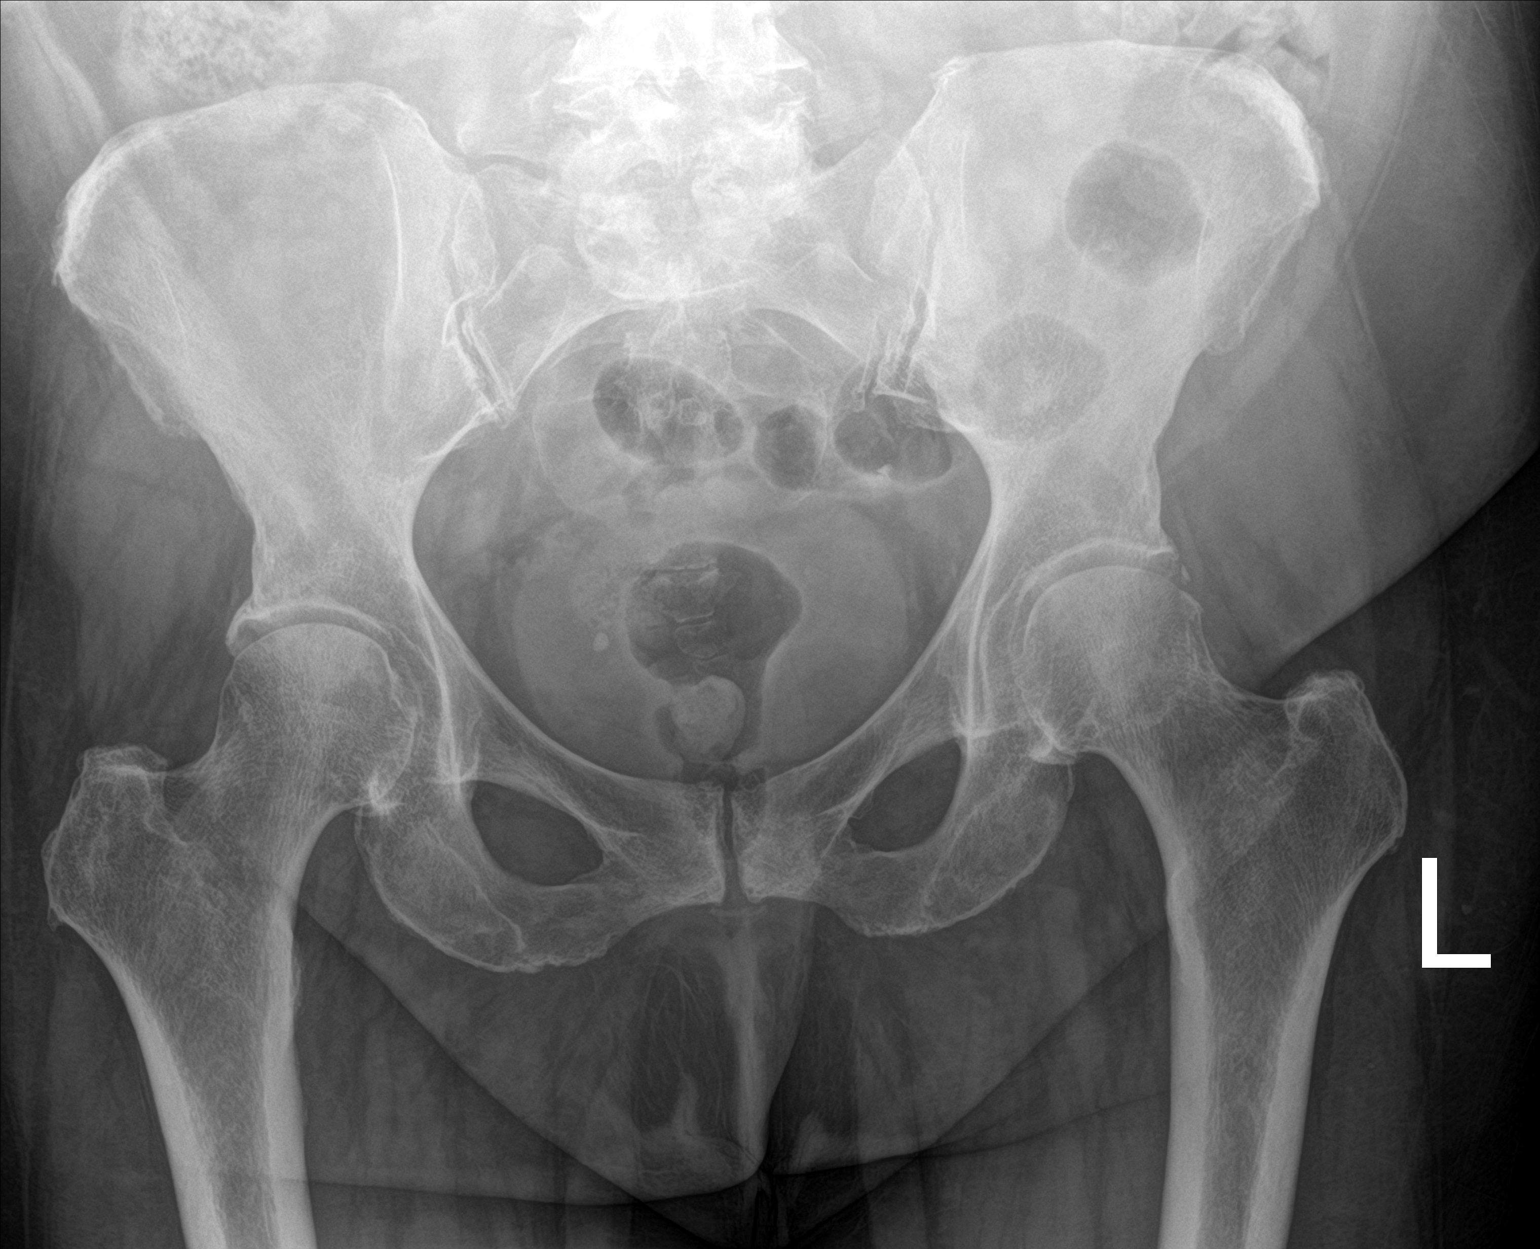

[hip ap]
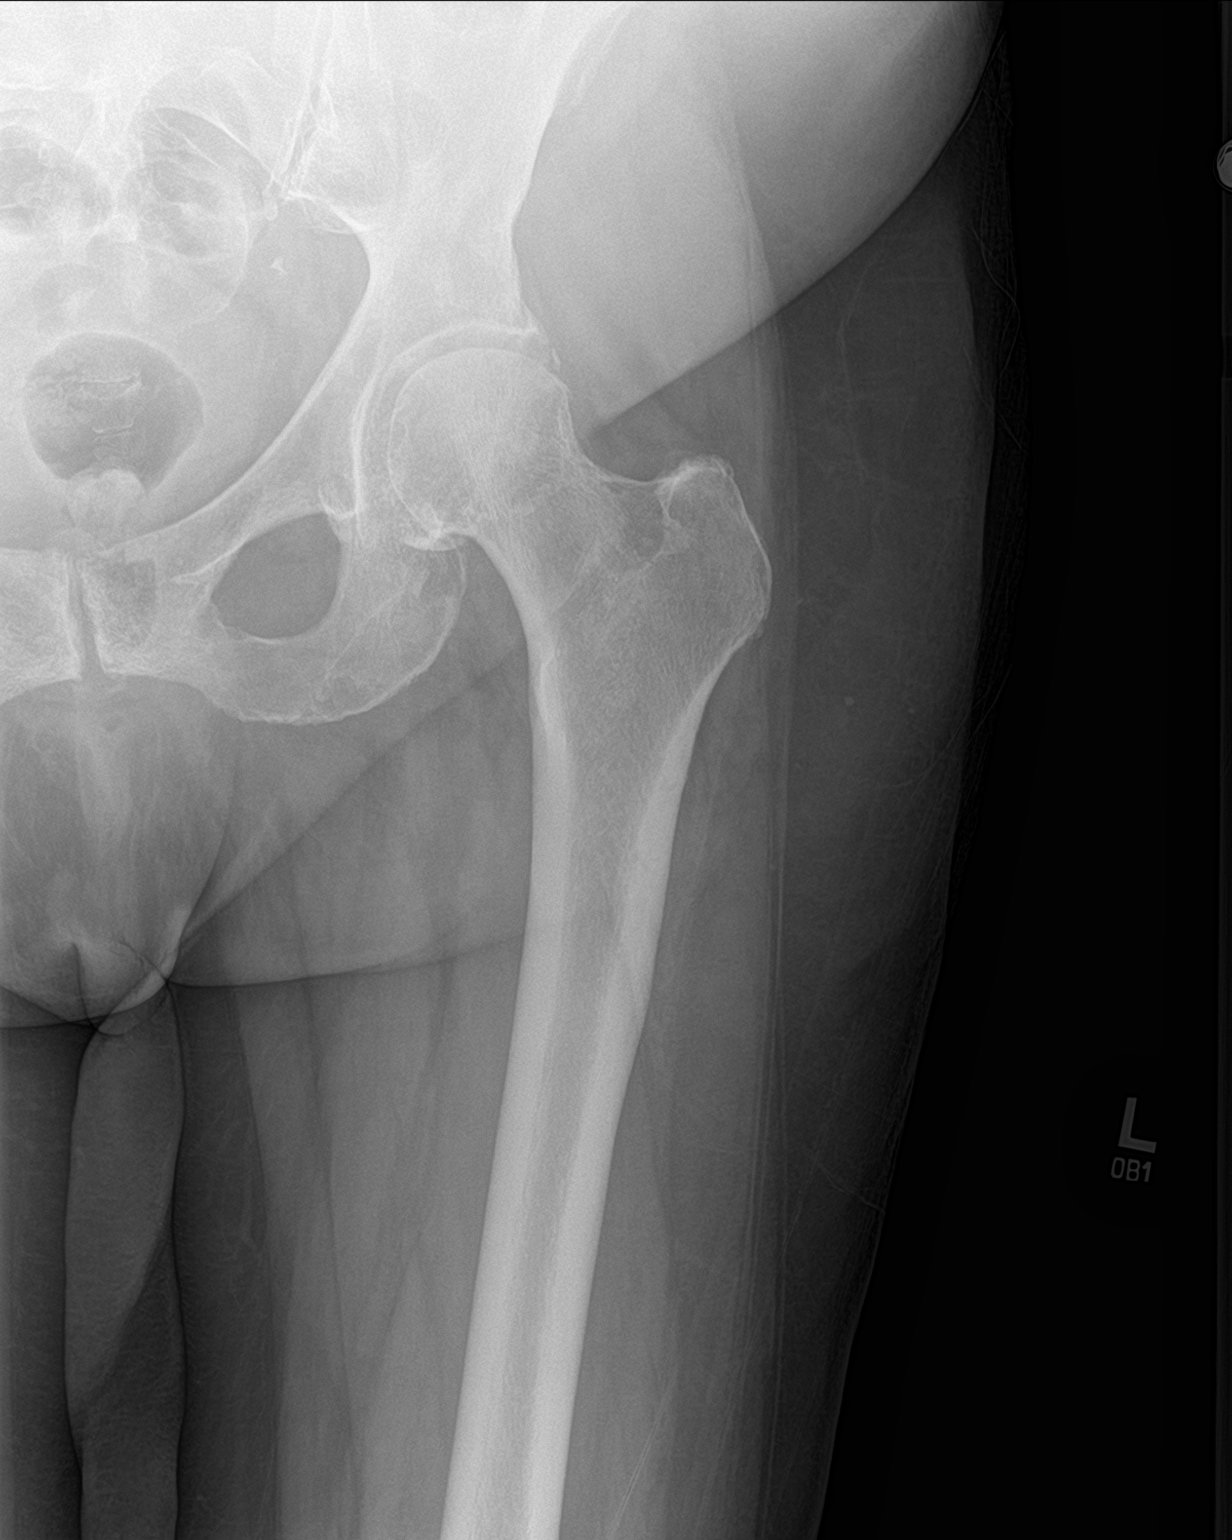

[hip lat]
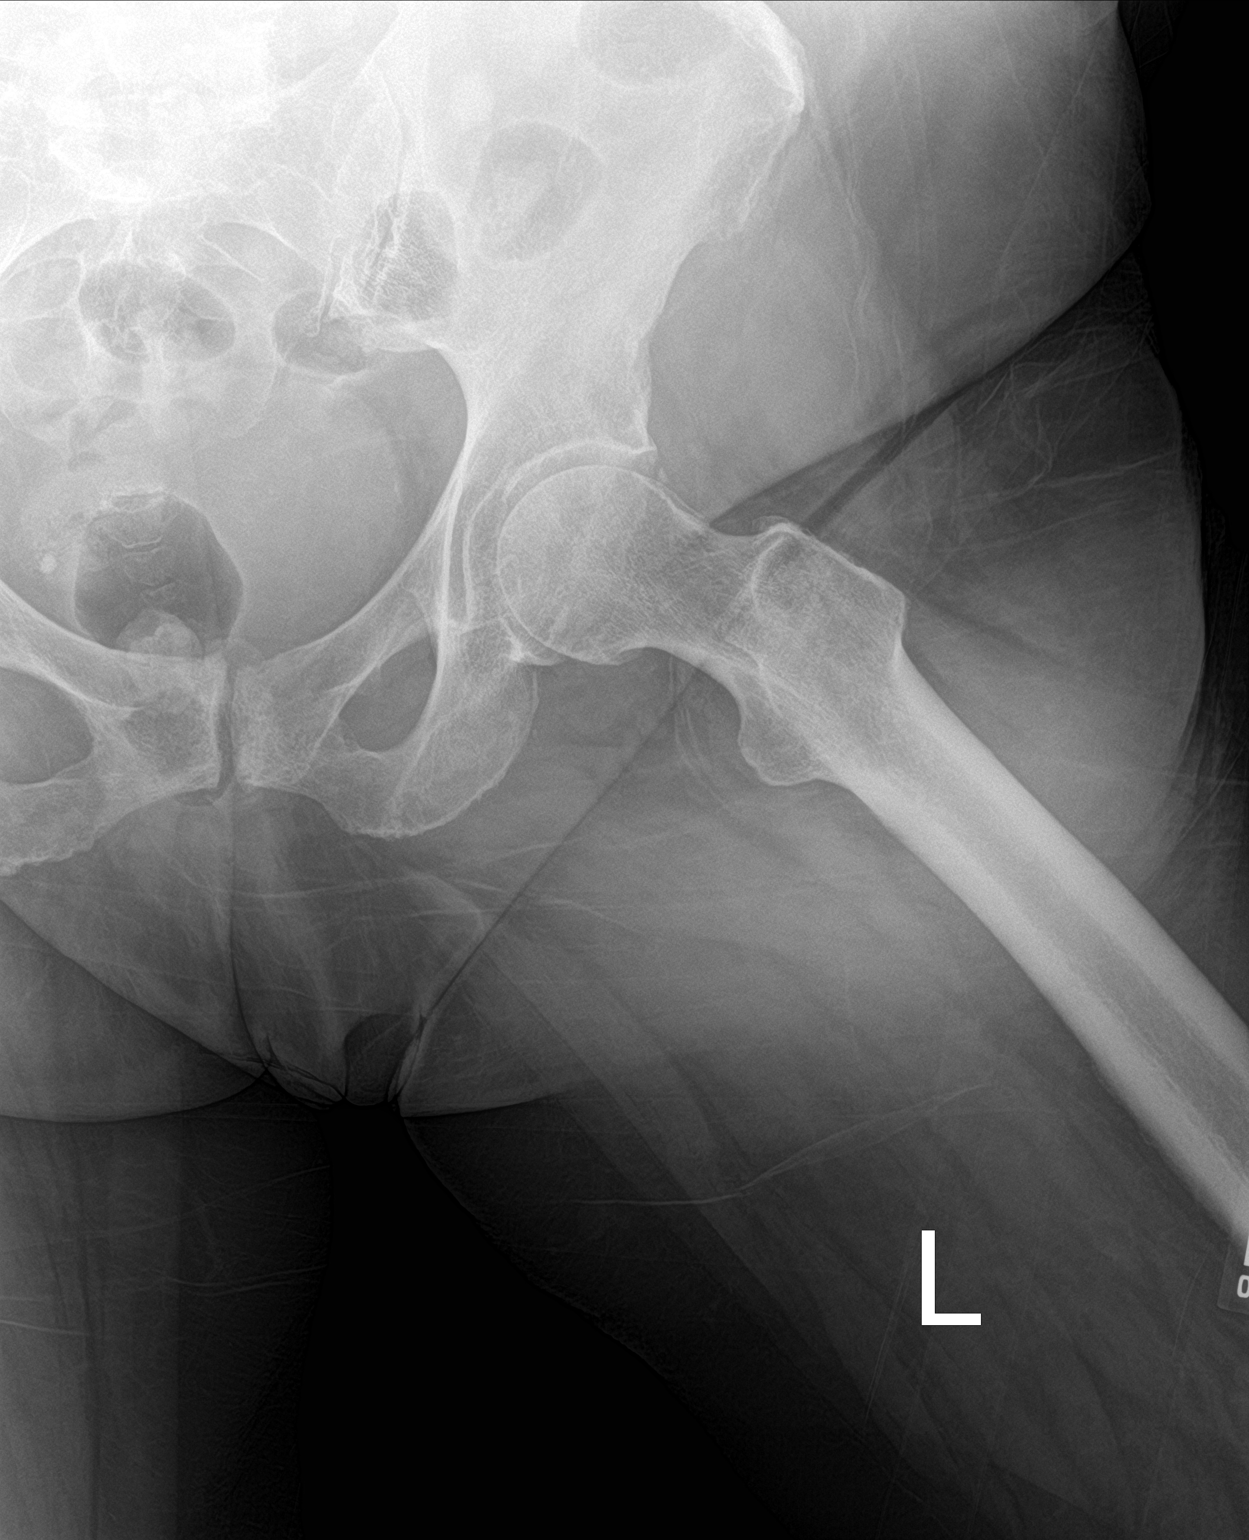

[3 of 3 positions shown; findings below may reference images not displayed]

FINDINGS: Hip joints and SI joints are symmetric and unremarkable. No acute
bony abnormality. Specifically, no fracture, subluxation, or
dislocation.
IMPRESSION: Negative.

## 2020-03-02 IMAGING — CR DG KNEE 1-2V*R*
2 series · 2 of 2 positions shown · non-contrast
Comparison: None.

CLINICAL DATA: Fall

EXAM:
RIGHT KNEE - 1-2 VIEW

[knee ap]
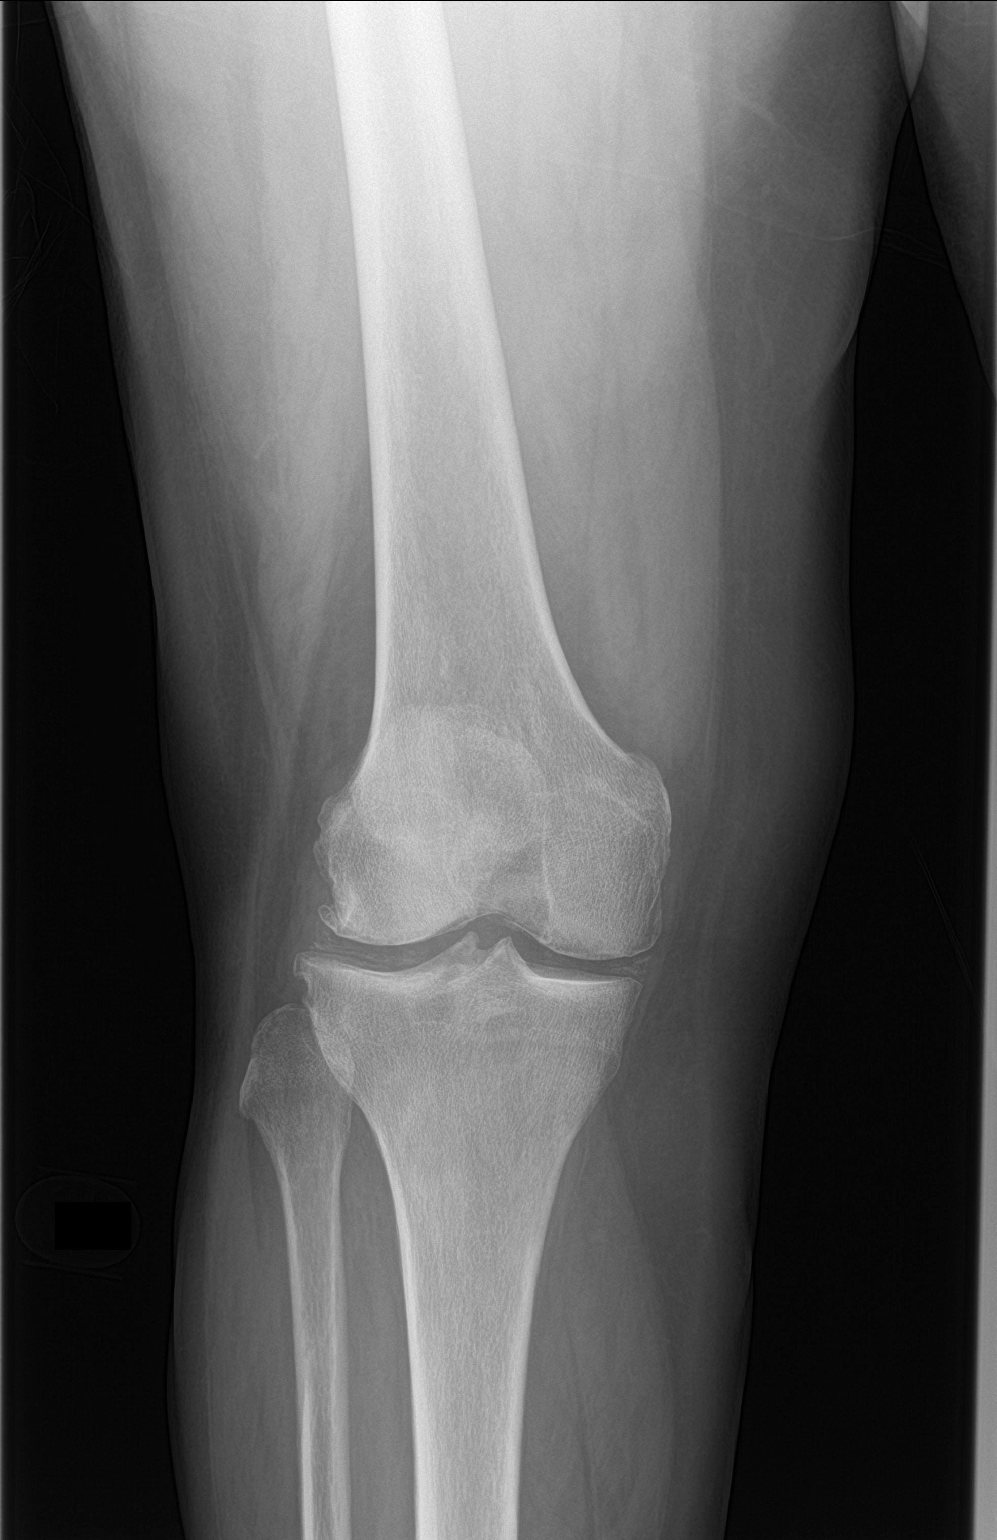

[knee lat]
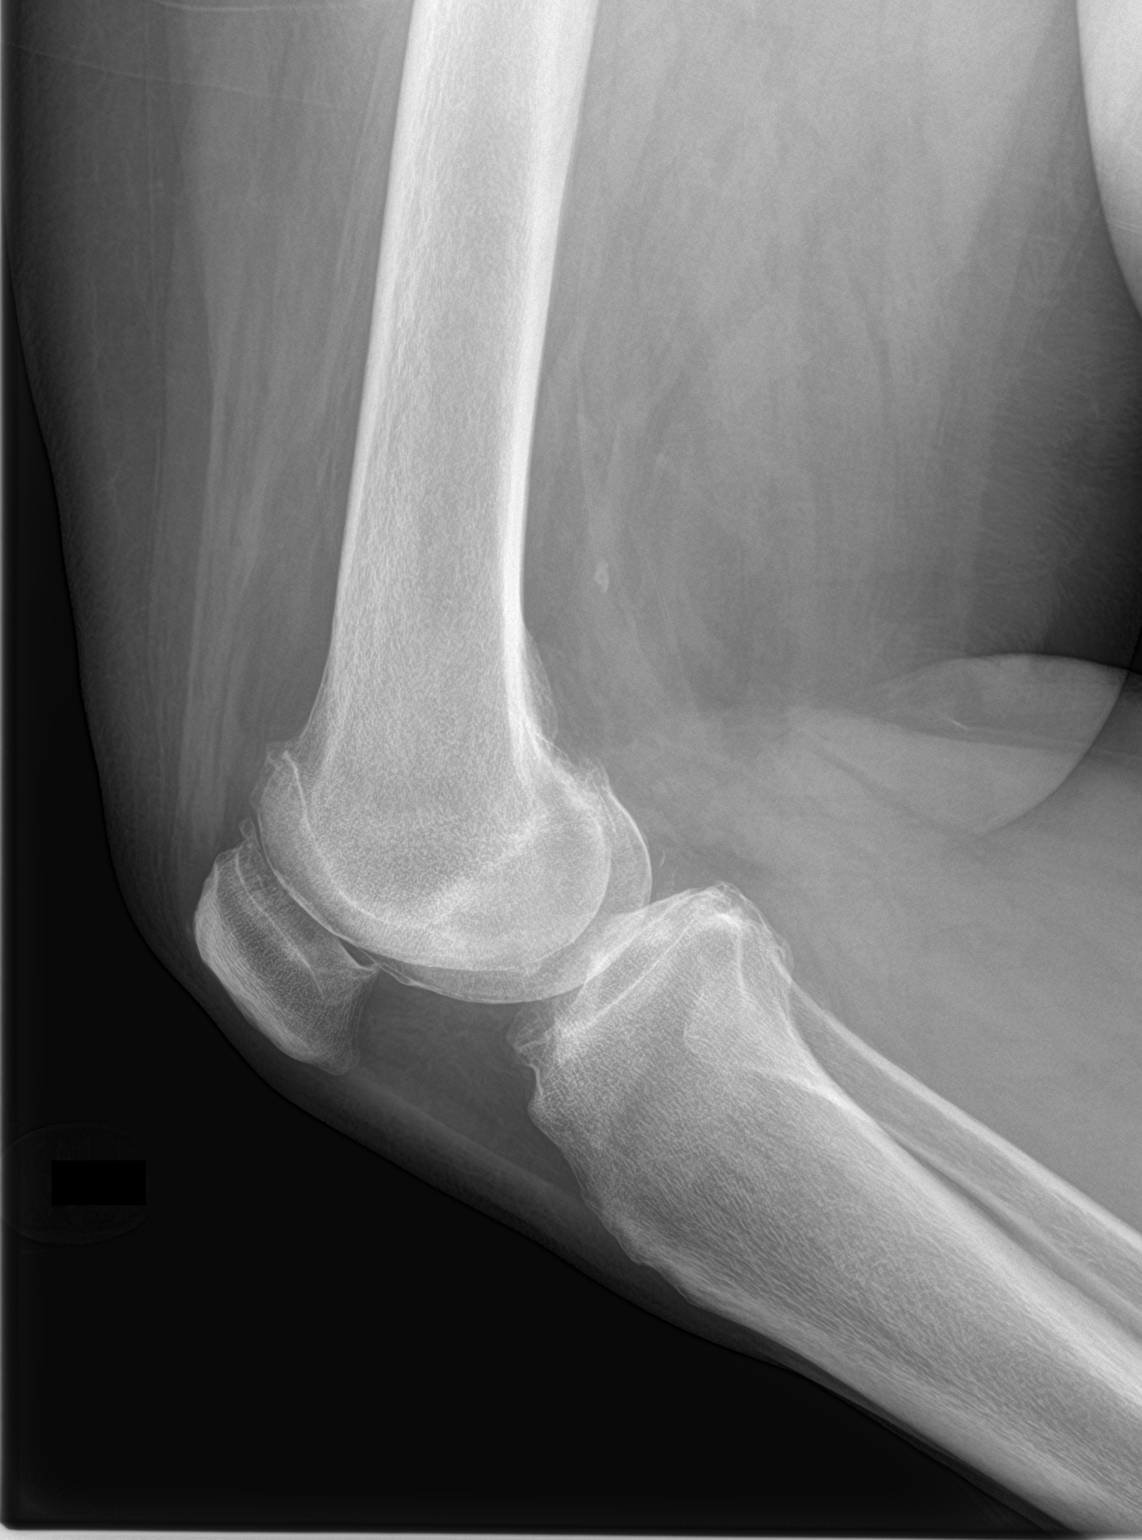

[2 of 2 positions shown; findings below may reference images not displayed]

FINDINGS: Chondrocalcinosis with joint space narrowing and spurring in all 3
compartments. No acute bony abnormality. Specifically, no fracture,
subluxation, or dislocation. No joint effusion.
IMPRESSION: Mild degenerative changes.  No acute bony abnormality.

## 2020-03-02 MED ORDER — ASPIRIN 81 MG PO TABS: 81.0000 mg | ORAL_TABLET | Freq: Every day | ORAL | Status: DC

## 2020-03-02 MED ORDER — ACETAMINOPHEN 325 MG PO TABS: 650.0000 mg | ORAL_TABLET | Freq: Four times a day (QID) | ORAL | Status: AC | PRN

## 2020-03-02 MED ORDER — LIVING WELL WITH DIABETES BOOK
Freq: Once | Status: DC
  Filled 2020-03-02: qty 1

## 2020-03-02 MED ORDER — ADULT MULTIVITAMIN W/MINERALS CH: 1.0000 | ORAL_TABLET | Freq: Every day | ORAL | Status: AC

## 2020-03-02 MED ORDER — LEVETIRACETAM 500 MG PO TABS: 500.0000 mg | ORAL_TABLET | Freq: Two times a day (BID) | ORAL | 0 refills | Status: DC

## 2020-03-02 MED ORDER — ADULT MULTIVITAMIN W/MINERALS CH: 1.0000 | ORAL_TABLET | Freq: Every day | ORAL | Status: DC

## 2020-03-02 NOTE — Progress Notes (Signed)
Brief Nutrition Education Note   RD consulted for nutrition education regarding diabetes.   Lab Results  Component Value Date   HGBA1C 6.3 01/04/2020  PTA DM medications are 2.5 mg glipizide daily and 25 mg empagliflozin daily.   Per ADA's Standards of Medical Care for Diabetes, glycemic targets for elderly patients who are otherwise healthy (few medical impairments) and cognitively intact should be less stringent (Hgb A1c <7.5).   Labs reviewed: CBGS: 90-138  (inpatient orders for glycemic control are 0-9 units insulin aspart every 4 hours).   Spoke with pt son via phone, who reports that pt is unable to talk right now, as she is being evaluated by physical therapy. DM coordinator consult also pending.   RD provided "Carbohydrate Counting for People with Diabetes" handout from the Academy of Nutrition and Dietetics; attached to AVS/ discharge instructions.  Current diet order is carb modified, patient is consuming approximately n/a% of meals at this time. Labs and medications reviewed. No further nutrition interventions warranted at this time. RD contact information provided. If additional nutrition issues arise, please re-consult RD.  Loistine Chance, RD, LDN, [facility] Registered Dietitian II Certified Diabetes Care and Education Specialist Please refer to [facility] for RD and/or RD on-call/weekend/after hours pager

## 2020-03-02 NOTE — Discharge Instructions (Signed)

## 2020-03-02 NOTE — Progress Notes (Signed)
Pt lying in bed. Reports she went to bathroom unassisted. Reports she went down on left hip and bilat knees d/t dizziness.  No injury, bruises, laceration noted. Denies hitting head. Dr Sharlet Salina notified. New order for x ray of left hip and bilat knees. Bed alarm on, fall mats placed beside bed.  VS stable, will cont to monitor.

## 2020-03-02 NOTE — Discharge Summary (Signed)
Physician Discharge Summary  Alexandra Wolfe GGY:[id number] DOB: [birth year] DOA: 02/29/2020  PCP: Biagio Borg, MD  Admit date: 02/29/2020 Discharge date: 03/02/2020  Admitted From: Home Discharge disposition: Home with son and 24/7 assistance   Recommendations for Outpatient Follow-Up:   1. Aspirin held for 1 week 2. Patient to follow-up with neurosurgery outpatient 3. Home health 4. 24/7 care by family 5. Patient was offered to go to a skilled nursing facility but ultimately she and her son decided to go home with home health   Discharge Diagnosis:   Principal Problem:   Acute subdural hematoma ([place]) Active Problems:   Hypothyroidism   Diabetes mellitus type II, non insulin dependent ([place])   Leukocytosis   Syncope   Mild renal insufficiency   Essential hypertension   Asthma    Discharge Condition: Improved.  Diet recommendation: Low sodium, heart healthy.  Carbohydrate-modified  Wound care: None.  Code status: Full.   History of Present Illness:   Alexandra Wolfe is a 74 y.o. female with medical history significant for type 2 diabetes mellitus, hypertension, asthma, hypothyroidism, and OSA with CPAP intolerance, now presenting to the emergency department after a syncopal episode with head injury.  Patient was taken to shower this afternoon when she suffered a syncopal episode.  She reports that she has been feeling a little bit weak past couple weeks, but has not had lightheadedness, chest pain, palpitations.  She was started 3 weeks ago limiting her carbohydrate and calories, and loss of mother's the first week.  She reports exertional dyspnea that has been going on for months, but no acute change and no fevers, chills, sore throat, loss of taste or smell.  Thyroid medications were recently adjusted.   Hospital Course by Problem:   Acute subdural hematoma -Presents after syncope in shower with scalp laceration and acute left subfalcine SDH on  CT -Neurosurgery consulting and much appreciated, recommending MAP 70-90, Keppra 500 mg BID x7 days, repeat non-contrast head CT in am, hold ASA 2 wks -Repeat CT on 3/24 was stable  Syncope -Presents after a syncopal episode in the shower  -She intentionally lost 5 lbs recently, has been feeling a little weak in general, and this may have been related to orthostasis -Continue telemetry -Echo: Shows low normal EF, outpatient cardiology follow-up if needed: Troponins negative  Vertigo/BPPV -Patient has had in the past -Patient states she did not do exercises but instead was treated with meclizine with improvement -PT recommends home health PT -Patient improved with meclizine and exercises  Mild renal insufficiency -SCr is 1.05 in ED, up from 0.80 in January 2021 -She recently started a new diet: Very low calorie, has lost 5 lbs, and this may be acute prerenal azotemia  Asthma -Patient reports months of DOE with occasional non-productive cough but no recent wheezing -Continue ICS/LABA and as-needed albuterol  Type II DM -A1c was 6.3% in January 2021 -Resume home meds  Leukocytosis -Appears chronic, no infectious s/s  obesity Body mass index is 39.22 kg/m. -Encouraged healthy weight loss including the [facility]andra Wolfe medical weight loss or Alexandra Wolfe medical weight loss instead of blue sky    Medical Consultants:   Neurosurgery   Discharge Exam:   Vitals:   03/02/20 0917 03/02/20 1005  BP: (!) 136/57 126/66  Pulse: 69 72  Resp:  18  Temp:  98.6 F (37 C)  SpO2:  97%   Vitals:   03/02/20 0418 03/02/20 0807 03/02/20 0917 03/02/20 1005  BP: 130/60  (!) 136/57 126/66  Pulse: 73  69 72  Resp: 18   18  Temp: 98.2 F (36.8 C)   98.6 F (37 C)  TempSrc: Oral   Oral  SpO2: 97% 96%  97%  Weight: 110 kg     Height:        General exam: Appears calm and comfortable.   The results of significant diagnostics from this hospitalization  (including imaging, microbiology, ancillary and laboratory) are listed below for reference.     Procedures and Diagnostic Studies:   CT HEAD WO CONTRAST  Result Date: 03/01/2020 CLINICAL DATA:  Head trauma, intracranial venous injury suspected. Additional history provided: Patient admitted yesterday status post fall, CT showed small falcine subdural hematoma, patient very dizzy today, reports occipital headache EXAM: CT HEAD WITHOUT CONTRAST TECHNIQUE: Contiguous axial images were obtained from the base of the skull through the vertex without intravenous contrast. COMPARISON:  Head CT 02/29/2020 FINDINGS: Brain: An acute subdural hemorrhage layering along the left aspect of the falx and left tentorium is unchanged as compared to head CT 02/29/2020. The hemorrhage again measures up to 10 mm in greatest thickness (series 3, image 24). No interval hemorrhage is demonstrated. No evidence of intracranial mass. No midline shift. No hydrocephalus. Vascular: No hyperdense vessel.  Atherosclerotic calcifications. Skull: Normal. Negative for fracture or focal lesion. Sinuses/Orbits: Visualized orbits demonstrate no acute abnormality. Trace ethmoid sinus mucosal thickening. No significant mastoid effusion. Other: Redemonstrated left parietal scalp hematoma. There is also a small amount of subcutaneous gas at this site suggestive of laceration. IMPRESSION: Stable acute subdural hemorrhage layering along the left aspect of the falx and left tentorium, again measuring up to 10 mm in thickness. No significant mass effect. Redemonstrated left posterior scalp hematoma/laceration. Electronically Signed   By: Kellie Simmering DO   On: 03/01/2020 08:59   CT HEAD WO CONTRAST  Result Date: 02/29/2020 CLINICAL DATA:  Syncopal episode, hit head EXAM: CT HEAD WITHOUT CONTRAST TECHNIQUE: Contiguous axial images were obtained from the base of the skull through the vertex without intravenous contrast. COMPARISON:  None. FINDINGS: Brain:  Acute subdural hematoma is present along the left aspect of the falx measuring up to 10 mm in thickness. There is no significant associated mass effect. Prominence of the ventricles and sulci reflects minor generalized parenchymal volume loss. Gray-white differentiation is preserved. Vascular: No hyperdense vessel or unexpected calcification. Skull: Calvarium is unremarkable. Sinuses/Orbits: No acute finding. Other: Posterior scalp soft tissue swelling. IMPRESSION: Acute left falcine subdural hematoma.  No significant mass effect. These results were called by telephone at the time of interpretation on 02/29/2020 at 5:01 pm to provider Veryl Speak , who verbally acknowledged these results. Electronically Signed   By: Macy Mis M.D.   On: 02/29/2020 17:04   CT Cervical Spine Wo Contrast  Result Date: 02/29/2020 CLINICAL DATA:  Syncope EXAM: CT CERVICAL SPINE WITHOUT CONTRAST TECHNIQUE: Multidetector CT imaging of the cervical spine was performed without intravenous contrast. Multiplanar CT image reconstructions were also generated. COMPARISON:  None. FINDINGS: Alignment: No significant listhesis. Skull base and vertebrae: No acute cervical spine fracture. Vertebral body heights are maintained apart from degenerative endplate irregularity. Soft tissues and spinal canal: No prevertebral fluid or swelling. No visible canal hematoma. Disc levels: Multilevel degenerative changes are present including disc space narrowing, endplate osteophytes, and facet and uncovertebral hypertrophy. Upper chest: Negative. Other: Mild calcified plaque at the left ICA origin. IMPRESSION: No acute cervical spine fracture. Electronically Signed   By:  Macy Mis M.D.   On: 02/29/2020 17:07   Portable Chest 1 View  Result Date: 02/29/2020 CLINICAL DATA:  Syncope. EXAM: PORTABLE CHEST 1 VIEW COMPARISON:  January 04, 2020 FINDINGS: The heart size and mediastinal contours are within normal limits. There is moderate severity  calcification of the aortic arch. Both lungs are clear. Degenerative changes are seen involving both shoulders. The visualized skeletal structures are otherwise unremarkable. IMPRESSION: No active disease. Electronically Signed   By: Virgina Norfolk M.D.   On: 02/29/2020 20:52   DG Shoulder Right Portable  Result Date: 02/29/2020 CLINICAL DATA:  Fall in shower. Right shoulder pain. EXAM: PORTABLE RIGHT SHOULDER COMPARISON:  None. FINDINGS: Prominent spurring of the humeral head and glenoid with prominent loss of articular space in the glenohumeral joint compatible with severe osteoarthritis. No fracture or malalignment is identified. Subacromial morphology is type 2 (curved). IMPRESSION: Severe glenohumeral osteoarthritis. No acute findings. Electronically Signed   By: Van Clines M.D.   On: 02/29/2020 18:22   ECHOCARDIOGRAM COMPLETE  Result Date: 03/01/2020    ECHOCARDIOGRAM REPORT   Patient Name:   Jerzey FERNER Date of Exam: 03/01/2020 Medical Rec #:  [id number]        Height:       66.0 in Accession #:    [id number]       Weight:       243.0 lb Date of Birth:  1946/11/11        BSA:          2.172 m Patient Age:    35 years         BP:           153/83 mmHg Patient Gender: F                HR:           81 bpm. Exam Location:  Inpatient Procedure: 2D Echo Indications:    Syncope R55  History:        Patient has prior history of Echocardiogram examinations, most                 recent 12/16/2013. Risk Factors:Dyslipidemia, Diabetes and Former                 Smoker.  Sonographer:    Mikki Santee RDCS (AE) Referring Phys: [id number] [place]  1. Mid and basal posterior lateral wall hypokinesis . Left ventricular ejection fraction, by estimation, is 50 to 55%. The left ventricle has low normal function. The left ventricle demonstrates regional wall motion abnormalities (see scoring diagram/findings for description). Left ventricular diastolic parameters were normal.  2. Right  ventricular systolic function is normal. The right ventricular size is normal.  3. The mitral valve is normal in structure. Trivial mitral valve regurgitation. No evidence of mitral stenosis.  4. The aortic valve was not well visualized. Aortic valve regurgitation is not visualized. No aortic stenosis is present.  5. The inferior vena cava is normal in size with greater than 50% respiratory variability, suggesting right atrial pressure of 3 mmHg. FINDINGS  Left Ventricle: Mid and basal posterior lateral wall hypokinesis. Left ventricular ejection fraction, by estimation, is 50 to 55%. The left ventricle has low normal function. The left ventricle demonstrates regional wall motion abnormalities. The left ventricular internal cavity size was normal in size. There is no left ventricular hypertrophy. Left ventricular diastolic parameters were normal. Right Ventricle: The right ventricular size is normal. No increase in  right ventricular wall thickness. Right ventricular systolic function is normal. Left Atrium: Left atrial size was normal in size. Right Atrium: Right atrial size was normal in size. Pericardium: There is no evidence of pericardial effusion. Mitral Valve: The mitral valve is normal in structure. There is mild thickening of the mitral valve leaflet(s). Normal mobility of the mitral valve leaflets. Trivial mitral valve regurgitation. No evidence of mitral valve stenosis. Tricuspid Valve: The tricuspid valve is normal in structure. Tricuspid valve regurgitation is trivial. No evidence of tricuspid stenosis. Aortic Valve: The aortic valve was not well visualized. Aortic valve regurgitation is not visualized. No aortic stenosis is present. Pulmonic Valve: The pulmonic valve was normal in structure. Pulmonic valve regurgitation is not visualized. No evidence of pulmonic stenosis. Aorta: The aortic root is normal in size and structure. Venous: The inferior vena cava is normal in size with greater than 50%  respiratory variability, suggesting right atrial pressure of 3 mmHg. IAS/Shunts: No atrial level shunt detected by color flow Doppler.  LEFT VENTRICLE PLAX 2D LVIDd:         4.30 cm  Diastology LVIDs:         2.50 cm  LV e' lateral:   7.51 cm/s LV PW:         0.90 cm  LV E/e' lateral: 8.0 LV IVS:        0.90 cm  LV e' medial:    6.53 cm/s LVOT diam:     2.20 cm  LV E/e' medial:  9.2 LV SV:         81 LV SV Index:   37 LVOT Area:     3.80 cm  RIGHT VENTRICLE RV S prime:     13.40 cm/s TAPSE (M-mode): 2.0 cm LEFT ATRIUM             Index       RIGHT ATRIUM           Index LA diam:        3.60 cm 1.66 cm/m  RA Area:     11.20 cm LA Vol (A2C):   30.3 ml 13.95 ml/m RA Volume:   24.20 ml  11.14 ml/m LA Vol (A4C):   45.0 ml 20.71 ml/m LA Biplane Vol: 41.0 ml 18.87 ml/m  AORTIC VALVE LVOT Vmax:   94.20 cm/s LVOT Vmean:  66.800 cm/s LVOT VTI:    0.213 m  AORTA Ao Root diam: 3.70 cm MITRAL VALVE MV Area (PHT): 3.60 cm    SHUNTS MV Decel Time: 211 msec    Systemic VTI:  0.21 m MV E velocity: 60.00 cm/s  Systemic Diam: 2.20 cm MV A velocity: 85.30 cm/s MV E/A ratio:  0.70 Jenkins Rouge MD Electronically signed by Jenkins Rouge MD Signature Date/Time: 03/01/2020/11:55:17 AM    Final      Labs:   Basic Metabolic Panel: Recent Labs  Lab 02/29/20 1603 03/01/20 0259  NA 140 142  K 3.7 3.5  CL 103 107  CO2 19* 21*  GLUCOSE 140* 122*  BUN 29* 22  CREATININE 1.05* 0.93  CALCIUM 9.6 9.4   GFR Estimated Creatinine Clearance: 66.7 mL/min (by C-G formula based on SCr of 0.93 mg/dL). Liver Function Tests: Recent Labs  Lab 03/01/20 0259  AST 35  ALT 42  ALKPHOS 70  BILITOT 0.8  PROT 6.5  ALBUMIN 4.0   No results for input(s): LIPASE, AMYLASE in the last 168 hours. No results for input(s): AMMONIA in the last 168 hours. Coagulation  profile Recent Labs  Lab 02/29/20 1632  INR 1.0    CBC: Recent Labs  Lab 02/29/20 1603 03/01/20 0259  WBC 13.4* 14.4*  NEUTROABS  --  8.1*  HGB 14.7 14.2  HCT  45.1 41.9  MCV 90.7 88.0  PLT 368 341   Cardiac Enzymes: No results for input(s): CKTOTAL, CKMB, CKMBINDEX, TROPONINI in the last 168 hours. BNP: Invalid input(s): POCBNP CBG: Recent Labs  Lab 03/01/20 2007 03/02/20 0009 03/02/20 0429 03/02/20 0755 03/02/20 1111  GLUCAP 94 90 103* 138* 107*   D-Dimer No results for input(s): DDIMER in the last 72 hours. Hgb A1c No results for input(s): HGBA1C in the last 72 hours. Lipid Profile No results for input(s): CHOL, HDL, LDLCALC, TRIG, CHOLHDL, LDLDIRECT in the last 72 hours. Thyroid function studies No results for input(s): TSH, T4TOTAL, T3FREE, THYROIDAB in the last 72 hours.  Invalid input(s): FREET3 Anemia work up No results for input(s): VITAMINB12, FOLATE, FERRITIN, TIBC, IRON, RETICCTPCT in the last 72 hours. Microbiology Recent Results (from the past 240 hour(s))  SARS CORONAVIRUS 2 (TAT 6-24 HRS) Nasopharyngeal Nasopharyngeal Swab     Status: None   Collection Time: 02/29/20  9:07 PM   Specimen: Nasopharyngeal Swab  Result Value Ref Range Status   SARS Coronavirus 2 NEGATIVE NEGATIVE Final    Comment: (NOTE) SARS-CoV-2 target nucleic acids are NOT DETECTED. The SARS-CoV-2 RNA is generally detectable in upper and lower respiratory specimens during the acute phase of infection. Negative results do not preclude SARS-CoV-2 infection, do not rule out co-infections with other pathogens, and should not be used as the sole basis for treatment or other patient management decisions. Negative results must be combined with clinical observations, patient history, and epidemiological information. The expected result is Negative. Fact Sheet for Patients: SugarRoll.be Fact Sheet for Healthcare Providers: https://www.woods-mathews.com/ This test is not yet approved or cleared by the [place] FDA and  has been authorized for detection and/or diagnosis of SARS-CoV-2 by FDA under an  Emergency Use Authorization (EUA). This EUA will remain  in effect (meaning this test can be used) for the duration of the COVID-19 declaration under Section 56 4(b)(1) of the Act, 21 U.S.C. section 360bbb-3(b)(1), unless the authorization is terminated or revoked sooner. Performed at [facility], [place] [address] [place], [place] [address]      Discharge Instructions:   Discharge Instructions    Diet - low sodium heart healthy   Complete by: As directed    Diet Carb Modified   Complete by: As directed    Discharge instructions   Complete by: As directed    Home health and 24 hour care can restart aspirin in 1 week, f/u in neurosurgery clinic in 2 weeks total of 7 days of Keppra   Increase activity slowly   Complete by: As directed      Allergies as of 03/02/2020      Reactions   Aripiprazole    REACTION: agitation   Simvastatin    REACTION: myalgia      Medication List    STOP taking these medications   Lancets Misc   levothyroxine 50 MCG tablet Commonly known as: SYNTHROID   ondansetron 8 MG disintegrating tablet Commonly known as: Zofran ODT   OneTouch Verio test strip Generic drug: glucose blood   oxybutynin 10 MG 24 hr tablet Commonly known as: Ditropan XL     TAKE these medications   acetaminophen 325 MG tablet Commonly known as: TYLENOL Take 2 tablets (  650 mg total) by mouth every 6 (six) hours as needed for mild pain or headache.   albuterol 108 (90 Base) MCG/ACT inhaler Commonly known as: VENTOLIN HFA Inhale 2 puffs into the lungs every 6 (six) hours as needed for wheezing or shortness of breath.   aspirin 81 MG tablet Take 1 tablet (81 mg total) by mouth daily. Start taking on: March 08, 2020 What changed: These instructions start on March 08, 2020. If you are unsure what to do until then, ask your doctor or other care provider.   atorvastatin 80 MG tablet Commonly known as: LIPITOR Take 1 tablet (80 mg total) by mouth daily.     budesonide-formoterol 160-4.5 MCG/ACT inhaler Commonly known as: Symbicort Inhale 2 puffs into the lungs 2 (two) times daily.   CALCIUM 500 PO Take 2,000 mg by mouth daily.   cetirizine 10 MG tablet Commonly known as: ZYRTEC Take 1 tablet (10 mg total) by mouth daily.   diclofenac 75 MG EC tablet Commonly known as: VOLTAREN Take 75 mg by mouth 2 (two) times daily.   fluticasone 50 MCG/ACT nasal spray Commonly known as: FLONASE SHAKE LIQUID AND USE 2 SPRAYS IN EACH NOSTRIL DAILY FOR ALLERGIES What changed: See the new instructions.   glipiZIDE 2.5 MG 24 hr tablet Commonly known as: GLUCOTROL XL Take 1 tablet (2.5 mg total) by mouth daily with breakfast. What changed: when to take this   hydrochlorothiazide 25 MG tablet Commonly known as: HYDRODIURIL TAKE 1/2 TABLET(12.5 MG) BY MOUTH DAILY What changed: See the new instructions.   Jardiance 25 MG Tabs tablet Generic drug: empagliflozin Take 25 mg by mouth daily.   levETIRAcetam 500 MG tablet Commonly known as: Keppra Take 1 tablet (500 mg total) by mouth 2 (two) times daily.   meclizine 25 MG tablet Commonly known as: Antivert Take 1 tablet (25 mg total) by mouth 3 (three) times daily as needed for dizziness.   metFORMIN 500 MG 24 hr tablet Commonly known as: GLUCOPHAGE-XR TAKE 4 TABLETS BY MOUTH EVERY MORNING What changed: See the new instructions.   metoprolol succinate 25 MG 24 hr tablet Commonly known as: TOPROL-XL TAKE 1 TABLET(25 MG) BY MOUTH DAILY What changed:   how much to take  how to take this  when to take this  additional instructions   multivitamin with minerals Tabs tablet Take 1 tablet by mouth daily.   Myrbetriq 50 MG Tb24 tablet Generic drug: mirabegron ER Take 50 mg by mouth at bedtime.   NP Thyroid 30 MG tablet Generic drug: thyroid Take 30 mg by mouth daily.   ondansetron 4 MG tablet Commonly known as: ZOFRAN Take 4 mg by mouth every 8 (eight) hours as needed for nausea or  vomiting.   OneTouch Verio w/Device Kit Use as directed twice daily E11.9   traZODone 50 MG tablet Commonly known as: DESYREL TAKE 1 TABLET(50 MG) BY MOUTH AT BEDTIME What changed:   how much to take  how to take this  when to take this  additional instructions   Vitamin D (Ergocalciferol) 1.25 MG (50000 UNIT) Caps capsule Commonly known as: DRISDOL Take 1 capsule (50,000 Units total) by mouth every 7 (seven) days.            Durable Medical Equipment  (From admission, onward)         Start     Ordered   03/02/20 1142  For home use only DME Tub bench  Once     03/02/20 1142  03/02/20 1142  For home use only DME 3 n 1  Once     03/02/20 1142         Follow-up Information    [facility] Follow up.   Specialty: Home Health Services Contact information: [address] [address] [place] [address] [phone]        Biagio Borg, MD Follow up in 1 week(s).   Specialties: Internal Medicine, Radiology Contact information: [place] [address] [phone]        Vallarie Mare, MD Follow up in 2 week(s).   Specialty: Neurosurgery Contact information: [address] [place]  [address] [phone]            Time coordinating discharge:25 min  Signed:  Geradine Girt DO  [facility] 03/02/2020, 2:23 PM

## 2020-03-02 NOTE — Progress Notes (Signed)
Subjective: Patient has a "light" headache but overall feels better.  However, she has persistent dizziness making it difficulty for her to ambulate.  Objective: Vital signs in last 24 hours: Temp:  [97.6 F (36.4 C)-98.6 F (37 C)] 98.6 F (37 C) (03/25 1005) Pulse Rate:  [69-82] 72 (03/25 1005) Resp:  [16-18] 18 (03/25 1005) BP: (117-136)/(52-66) 126/66 (03/25 1005) SpO2:  [92 %-99 %] 97 % (03/25 1005) Weight:  [110 kg] 110 kg (03/25 0418)  Intake/Output from previous day: 03/24 0701 - 03/25 0700 In: 340 [P.O.:240; IV Piggyback:100] Out: 400 [Urine:400] Intake/Output this shift: No intake/output data recorded.  Awake, alert, Ox3 FC x 4, no drift  Lab Results: Recent Labs    02/29/20 1603 03/01/20 0259  WBC 13.4* 14.4*  HGB 14.7 14.2  HCT 45.1 41.9  PLT 368 341   BMET Recent Labs    02/29/20 1603 03/01/20 0259  NA 140 142  K 3.7 3.5  CL 103 107  CO2 19* 21*  GLUCOSE 140* 122*  BUN 29* 22  CREATININE 1.05* 0.93  CALCIUM 9.6 9.4    Studies/Results: DG Knee 1-2 Views Left  Result Date: 03/02/2020 CLINICAL DATA:  Fall EXAM: LEFT KNEE - 1-2 VIEW COMPARISON:  None. FINDINGS: Chondrocalcinosis with tricompartment degenerative changes. No joint effusion. No acute bony abnormality. Specifically, no fracture, subluxation, or dislocation. IMPRESSION: Degenerative changes.  No acute bony abnormality. Electronically Signed   By: Rolm Baptise M.D.   On: 03/02/2020 02:55   DG Knee 1-2 Views Right  Result Date: 03/02/2020 CLINICAL DATA:  Fall EXAM: RIGHT KNEE - 1-2 VIEW COMPARISON:  None. FINDINGS: Chondrocalcinosis with joint space narrowing and spurring in all 3 compartments. No acute bony abnormality. Specifically, no fracture, subluxation, or dislocation. No joint effusion. IMPRESSION: Mild degenerative changes.  No acute bony abnormality. Electronically Signed   By: Rolm Baptise M.D.   On: 03/02/2020 02:55   CT HEAD WO CONTRAST  Result Date: 03/01/2020 CLINICAL  DATA:  Head trauma, intracranial venous injury suspected. Additional history provided: Patient admitted yesterday status post fall, CT showed small falcine subdural hematoma, patient very dizzy today, reports occipital headache EXAM: CT HEAD WITHOUT CONTRAST TECHNIQUE: Contiguous axial images were obtained from the base of the skull through the vertex without intravenous contrast. COMPARISON:  Head CT 02/29/2020 FINDINGS: Brain: An acute subdural hemorrhage layering along the left aspect of the falx and left tentorium is unchanged as compared to head CT 02/29/2020. The hemorrhage again measures up to 10 mm in greatest thickness (series 3, image 24). No interval hemorrhage is demonstrated. No evidence of intracranial mass. No midline shift. No hydrocephalus. Vascular: No hyperdense vessel.  Atherosclerotic calcifications. Skull: Normal. Negative for fracture or focal lesion. Sinuses/Orbits: Visualized orbits demonstrate no acute abnormality. Trace ethmoid sinus mucosal thickening. No significant mastoid effusion. Other: Redemonstrated left parietal scalp hematoma. There is also a small amount of subcutaneous gas at this site suggestive of laceration. IMPRESSION: Stable acute subdural hemorrhage layering along the left aspect of the falx and left tentorium, again measuring up to 10 mm in thickness. No significant mass effect. Redemonstrated left posterior scalp hematoma/laceration. Electronically Signed   By: Kellie Simmering DO   On: 03/01/2020 08:59   CT HEAD WO CONTRAST  Result Date: 02/29/2020 CLINICAL DATA:  Syncopal episode, hit head EXAM: CT HEAD WITHOUT CONTRAST TECHNIQUE: Contiguous axial images were obtained from the base of the skull through the vertex without intravenous contrast. COMPARISON:  None. FINDINGS: Brain: Acute subdural hematoma is present  along the left aspect of the falx measuring up to 10 mm in thickness. There is no significant associated mass effect. Prominence of the ventricles and sulci  reflects minor generalized parenchymal volume loss. Gray-white differentiation is preserved. Vascular: No hyperdense vessel or unexpected calcification. Skull: Calvarium is unremarkable. Sinuses/Orbits: No acute finding. Other: Posterior scalp soft tissue swelling. IMPRESSION: Acute left falcine subdural hematoma.  No significant mass effect. These results were called by telephone at the time of interpretation on 02/29/2020 at 5:01 pm to provider Veryl Speak , who verbally acknowledged these results. Electronically Signed   By: Macy Mis M.D.   On: 02/29/2020 17:04   CT Cervical Spine Wo Contrast  Result Date: 02/29/2020 CLINICAL DATA:  Syncope EXAM: CT CERVICAL SPINE WITHOUT CONTRAST TECHNIQUE: Multidetector CT imaging of the cervical spine was performed without intravenous contrast. Multiplanar CT image reconstructions were also generated. COMPARISON:  None. FINDINGS: Alignment: No significant listhesis. Skull base and vertebrae: No acute cervical spine fracture. Vertebral body heights are maintained apart from degenerative endplate irregularity. Soft tissues and spinal canal: No prevertebral fluid or swelling. No visible canal hematoma. Disc levels: Multilevel degenerative changes are present including disc space narrowing, endplate osteophytes, and facet and uncovertebral hypertrophy. Upper chest: Negative. Other: Mild calcified plaque at the left ICA origin. IMPRESSION: No acute cervical spine fracture. Electronically Signed   By: Macy Mis M.D.   On: 02/29/2020 17:07   Portable Chest 1 View  Result Date: 02/29/2020 CLINICAL DATA:  Syncope. EXAM: PORTABLE CHEST 1 VIEW COMPARISON:  January 04, 2020 FINDINGS: The heart size and mediastinal contours are within normal limits. There is moderate severity calcification of the aortic arch. Both lungs are clear. Degenerative changes are seen involving both shoulders. The visualized skeletal structures are otherwise unremarkable. IMPRESSION: No active  disease. Electronically Signed   By: Virgina Norfolk M.D.   On: 02/29/2020 20:52   DG Shoulder Right Portable  Result Date: 02/29/2020 CLINICAL DATA:  Fall in shower. Right shoulder pain. EXAM: PORTABLE RIGHT SHOULDER COMPARISON:  None. FINDINGS: Prominent spurring of the humeral head and glenoid with prominent loss of articular space in the glenohumeral joint compatible with severe osteoarthritis. No fracture or malalignment is identified. Subacromial morphology is type 2 (curved). IMPRESSION: Severe glenohumeral osteoarthritis. No acute findings. Electronically Signed   By: Van Clines M.D.   On: 02/29/2020 18:22   ECHOCARDIOGRAM COMPLETE  Result Date: 03/01/2020    ECHOCARDIOGRAM REPORT   Patient Name:   Alexandra Wolfe Date of Exam: 03/01/2020 Medical Rec #:  [id number]        Height:       66.0 in Accession #:    [id number]       Weight:       243.0 lb Date of Birth:  Nov 13, 1946        BSA:          2.172 m Patient Age:    74 years         BP:           153/83 mmHg Patient Gender: F                HR:           81 bpm. Exam Location:  Inpatient Procedure: 2D Echo Indications:    Syncope R55  History:        Patient has prior history of Echocardiogram examinations, most  recent 12/16/2013. Risk Factors:Dyslipidemia, Diabetes and Former                 Smoker.  Sonographer:    Mikki Santee RDCS (AE) Referring Phys: [id number] [place]  1. Mid and basal posterior lateral wall hypokinesis . Left ventricular ejection fraction, by estimation, is 50 to 55%. The left ventricle has low normal function. The left ventricle demonstrates regional wall motion abnormalities (see scoring diagram/findings for description). Left ventricular diastolic parameters were normal.  2. Right ventricular systolic function is normal. The right ventricular size is normal.  3. The mitral valve is normal in structure. Trivial mitral valve regurgitation. No evidence of mitral stenosis.  4. The  aortic valve was not well visualized. Aortic valve regurgitation is not visualized. No aortic stenosis is present.  5. The inferior vena cava is normal in size with greater than 50% respiratory variability, suggesting right atrial pressure of 3 mmHg. FINDINGS  Left Ventricle: Mid and basal posterior lateral wall hypokinesis. Left ventricular ejection fraction, by estimation, is 50 to 55%. The left ventricle has low normal function. The left ventricle demonstrates regional wall motion abnormalities. The left ventricular internal cavity size was normal in size. There is no left ventricular hypertrophy. Left ventricular diastolic parameters were normal. Right Ventricle: The right ventricular size is normal. No increase in right ventricular wall thickness. Right ventricular systolic function is normal. Left Atrium: Left atrial size was normal in size. Right Atrium: Right atrial size was normal in size. Pericardium: There is no evidence of pericardial effusion. Mitral Valve: The mitral valve is normal in structure. There is mild thickening of the mitral valve leaflet(s). Normal mobility of the mitral valve leaflets. Trivial mitral valve regurgitation. No evidence of mitral valve stenosis. Tricuspid Valve: The tricuspid valve is normal in structure. Tricuspid valve regurgitation is trivial. No evidence of tricuspid stenosis. Aortic Valve: The aortic valve was not well visualized. Aortic valve regurgitation is not visualized. No aortic stenosis is present. Pulmonic Valve: The pulmonic valve was normal in structure. Pulmonic valve regurgitation is not visualized. No evidence of pulmonic stenosis. Aorta: The aortic root is normal in size and structure. Venous: The inferior vena cava is normal in size with greater than 50% respiratory variability, suggesting right atrial pressure of 3 mmHg. IAS/Shunts: No atrial level shunt detected by color flow Doppler.  LEFT VENTRICLE PLAX 2D LVIDd:         4.30 cm  Diastology LVIDs:          2.50 cm  LV e' lateral:   7.51 cm/s LV PW:         0.90 cm  LV E/e' lateral: 8.0 LV IVS:        0.90 cm  LV e' medial:    6.53 cm/s LVOT diam:     2.20 cm  LV E/e' medial:  9.2 LV SV:         81 LV SV Index:   37 LVOT Area:     3.80 cm  RIGHT VENTRICLE RV S prime:     13.40 cm/s TAPSE (M-mode): 2.0 cm LEFT ATRIUM             Index       RIGHT ATRIUM           Index LA diam:        3.60 cm 1.66 cm/m  RA Area:     11.20 cm LA Vol (A2C):   30.3 ml 13.95 ml/m RA Volume:  24.20 ml  11.14 ml/m LA Vol (A4C):   45.0 ml 20.71 ml/m LA Biplane Vol: 41.0 ml 18.87 ml/m  AORTIC VALVE LVOT Vmax:   94.20 cm/s LVOT Vmean:  66.800 cm/s LVOT VTI:    0.213 m  AORTA Ao Root diam: 3.70 cm MITRAL VALVE MV Area (PHT): 3.60 cm    SHUNTS MV Decel Time: 211 msec    Systemic VTI:  0.21 m MV E velocity: 60.00 cm/s  Systemic Diam: 2.20 cm MV A velocity: 85.30 cm/s MV E/A ratio:  0.70 Jenkins Rouge MD Electronically signed by Jenkins Rouge MD Signature Date/Time: 03/01/2020/11:55:17 AM    Final    DG HIP UNILAT WITH PELVIS 2-3 VIEWS LEFT  Result Date: 03/02/2020 CLINICAL DATA:  Fall EXAM: DG HIP (WITH OR WITHOUT PELVIS) 2-3V LEFT COMPARISON:  None. FINDINGS: Hip joints and SI joints are symmetric and unremarkable. No acute bony abnormality. Specifically, no fracture, subluxation, or dislocation. IMPRESSION: Negative. Electronically Signed   By: Rolm Baptise M.D.   On: 03/02/2020 02:54    Assessment/Plan: 74 yo F s/p fall with small falcotentorial SDH, persistent dizziness symptoms - will need continued therapy - can restart aspirin in 1 week, f/u in neurosurgery clinic in 2 weeks - total of 7 days of Reader 03/02/2020, 10:27 AM

## 2020-03-02 NOTE — TOC Progression Note (Signed)
Transition of Care [facility]) - Progression Note    Patient Details  Name: ANAELLE VATER MRN: [id number] Date of Birth: 1946-04-11  Transition of Care [facility]) CM/SW Contact  Ziasia Lenoir, Edson Snowball, RN Phone Number: 03/02/2020, 11:46 AM  Clinical Narrative:     See prior note. Ordered tub bench and 3 in 1 with Zack with [employer].  Expected Discharge Plan: [place] Barriers to Discharge: No Barriers Identified  Expected Discharge Plan and Services Expected Discharge Plan: [place] Choice: Home Health, Durable Medical Equipment Living arrangements for the past 2 months: Single Family Home                 DME Arranged: 3-N-1, [place] rolling DME Agency: [employer] Date DME Agency Contacted: 03/02/20 Time DME Agency Contacted: [phone] Representative spoke with at DME Agency: Zack HH Arranged: PT, OT [place] Agency: [employer] Date [place]: 03/02/20 Time [place]: [id number] Representative spoke with at [place]: [place]) Interventions    Readmission Risk Interventions No flowsheet data found.

## 2020-03-02 NOTE — Progress Notes (Addendum)
Physical Therapy Treatment Patient Details Name: Alexandra Wolfe MRN: [id number] DOB: 1946-03-02 Today's Date: 03/02/2020    History of Present Illness Pt is a 74 y/o female admitted secondary to syncopal episode with fall from shower. Found to have subdural hematoma in L falx and L tentorium. Pt also with dizziness. PMH includes DM, HTN, asthma, and OSA with CPAP intolerance.     PT Comments    Despite overnight fall, pt is progressing, a bit more jovial today with decreased HA and double vision symptoms (she now has occluded glasses thanks to OT to help).  She was able to walk around the room with RW and min guard assist, cues for targeting and segmental turning.   Son present for education and HEP review.  Pt is reporting reluctance to call out at night for the son because she doesn't want to "bother him" and son is showing reluctance in helping with any toileting needs because it is his mother. She is not safe to return home with her current impulsivity and lack of awareness of her deficits.  She would benefit from closer supervision and monitoring at post acute rehab.  Pt is at very high risk for falling.  PT will continue to follow acutely for safe mobility progression  Follow Up Recommendations  Supervision for mobility/OOB;SNF     Equipment Recommendations  3in1 (PT);Other (comment)(tub transfer bench (PER OT Eval))    Recommendations for Other Services   NA     Precautions / Restrictions Precautions Precautions: Fall Precaution Comments: dizzy, unsteady, fell overnight, so make sure alarms are set.     Mobility  Bed Mobility Overal bed mobility: Modified Independent             General bed mobility comments: Moves slowly, relies on railing.   Transfers Overall transfer level: Needs assistance Equipment used: Rolling walker (2 wheeled) Transfers: Sit to/from Stand Sit to Stand: Min guard         General transfer comment: needed multiple attempts, cues for safe  hand placement and to stay standing a bit longer to focus her gaze and let dizziness settle before walking forward away from the bed.   Ambulation/Gait Ambulation/Gait assistance: Min guard Gait Distance (Feet): 30 Feet Assistive device: Rolling walker (2 wheeled) Gait Pattern/deviations: Step-through pattern;Staggering left;Staggering right Gait velocity: decreased   General Gait Details: Practiced visual targeting and segmental turning.  Son in room observing techniques.  Pt needs cues for compliance.  Min guard assist for safety and balance, no overt LOB during mobility. Double vision glasses that OT made donned.           Balance Overall balance assessment: Needs assistance Sitting-balance support: Feet supported;No upper extremity supported Sitting balance-Leahy Scale: Fair Sitting balance - Comments: close supervision for static sitting, symptoms with mobility.    Standing balance support: Bilateral upper extremity supported Standing balance-Leahy Scale: Poor Standing balance comment: needs external support/assist                            Cognition Arousal/Alertness: Awake/alert Behavior During Therapy: WFL for tasks assessed/performed Overall Cognitive Status: Impaired/Different from baseline Area of Impairment: Safety/judgement;Awareness;Memory                         Safety/Judgement: Decreased awareness of safety Awareness: Emergent Problem Solving: Slow processing General Comments: Pt doesn't want to "bother" anyone, so she did not call when she fell last night,  she also is reluctant to call her son when she gets home (son in room and aware he needs to be with her even in the middle of the night when she is up on her feet).        Exercises Other Exercises Other Exercises: x1 seated vertical and horizontal exercises given, reviewed, and handouts issued.  Pt is able to do ~ 5 reps up and down and more side to side (horizontal is less  symptomatic).  Cues for technique, <5/10 dizziness/nausea, and increasing speed as it gets easier.  Son present for this education.         Pertinent Vitals/Pain Pain Assessment: Faces Faces Pain Scale: Hurts little more Pain Location: back, head, knees, and hips Pain Descriptors / Indicators: Sore Pain Intervention(s): Limited activity within patient's tolerance;Monitored during session;Repositioned    Home Living                      Prior Function            PT Goals (current goals can now be found in the care plan section) Acute Rehab PT Goals Patient Stated Goal: to decrease her dizziness Progress towards PT goals: Progressing toward goals    Frequency    Min 3X/week(vestibular)      PT Plan Discharge plan needs to be updated       AM-PAC PT "6 Clicks" Mobility   Outcome Measure  Help needed turning from your back to your side while in a flat bed without using bedrails?: A Little Help needed moving from lying on your back to sitting on the side of a flat bed without using bedrails?: A Little Help needed moving to and from a bed to a chair (including a wheelchair)?: A Little Help needed standing up from a chair using your arms (e.g., wheelchair or bedside chair)?: A Little Help needed to walk in hospital room?: A Little Help needed climbing 3-5 steps with a railing? : A Little 6 Click Score: 18    End of Session   Activity Tolerance: Patient limited by fatigue Patient left: in bed;with family/visitor present;Other (comment);with bed alarm set(seated EOB) Nurse Communication: Mobility status PT Visit Diagnosis: Dizziness and giddiness (R42);Other symptoms and signs involving the nervous system (R29.898);Difficulty in walking, not elsewhere classified (R26.2)     Time: [id number] PT Time Calculation (min) (ACUTE ONLY): 43 min  Charges:  $Gait Training: 8-22 mins $Therapeutic Exercise: 8-22 mins $Self Care/Home Management: 8-22          Verdene Lennert,  PT, DPT  Acute Rehabilitation [phone] pager #[phone] office              03/02/2020, 2:02 PM

## 2020-03-02 NOTE — Progress Notes (Signed)
RN reports pt found in bed.  Patient stated she fell in her room, falling on both knees and left hip.  No visible trauma noted.  X-rays due to unwitnessed fall in elderly patient.

## 2020-03-02 NOTE — Plan of Care (Signed)
Diabetes education reviewed with pt, DM book and handouts given, pt had expressed need for education on diabetic diet and diabetes in general.

## 2020-03-02 NOTE — Progress Notes (Addendum)
Discharge instructions reviewed with pt, son aware of instructions to be reviewed, ok with pt getting copy of instructions and he will look at.  Copy of instructions given to pt, script for keppra sent to pt's pharmacy by MD and pt informed of this.  BSC/3 IN 1  was delivered to pt's room and was carried out by son.  Pt d/c'd via wheelchair with belongings, son gone to get car.    Escorted by unit NT.

## 2020-03-02 NOTE — Evaluation (Addendum)
Occupational Therapy Evaluation Patient Details Name: Alexandra Wolfe MRN: [id number] DOB: 1946-06-04 Today's Date: 03/02/2020    History of Present Illness Pt is a 74 y/o female admitted secondary to syncopal episode with fall from shower. Found to have subdural hematoma in L falx and L tentorium. Pt also with dizziness. PMH includes DM, HTN, asthma, and OSA with CPAP intolerance.    Clinical Impression   Pt reports having falls, leading a sedentary lifestyle fatiguing easily with attempts to perform housekeeping or grocery shopping. Pt presents with impaired balance and dizziness with high fall risk. Reinforced vision stabilization strategies and fabricated vision occlusion glasses for intermittent diplopia. Educated pt in benefits of 3 in 1 and tub transfer bench as well as fall prevention. Pt plans to discharge home with her son and his girlfriend. Will follow acutely.    Follow Up Recommendations  SNF;Supervision/Assistance - 24 hour (maximize home health services if she goes home including [place] aide)   Equipment Recommendations  3 in 1 bedside commode;Tub/shower bench    Recommendations for Other Services       Precautions / Restrictions Precautions Precautions: Fall Precaution Comments: dizzy, unsteady Restrictions Weight Bearing Restrictions: No      Mobility Bed Mobility Overal bed mobility: Modified Independent             General bed mobility comments: cues to move slowly and use visual targets  Transfers Overall transfer level: Needs assistance Equipment used: Rolling walker (2 wheeled) Transfers: Sit to/from Stand Sit to Stand: Min guard         General transfer comment: cues for hand placement    Balance Overall balance assessment: Needs assistance   Sitting balance-Leahy Scale: Fair       Standing balance-Leahy Scale: Poor                             ADL either performed or assessed with clinical judgement   ADL Overall ADL's :  Needs assistance/impaired Eating/Feeding: Independent   Grooming: Wash/dry hands;Standing;Min guard   Upper Body Bathing: Set up;Sitting;Supervision/ safety   Lower Body Bathing: Min guard;Sit to/from stand Lower Body Bathing Details (indicate cue type and reason): recommended seated showering Upper Body Dressing : Set up;Sitting   Lower Body Dressing: Min guard;Sit to/from stand Lower Body Dressing Details (indicate cue type and reason): able to perform figure four to reach her feet Toilet Transfer: Minimal assistance;Ambulation;RW Toilet Transfer Details (indicate cue type and reason): recommended BSC for night use at home as she does not like to trouble people to assist her  Toileting- Clothing Manipulation and Hygiene: Supervision/safety;Sitting/lateral lean       Functional mobility during ADLs: Minimal assistance;Min guard;Rolling walker General ADL Comments: educated pt in avoiding inverting her head and use of visual stabilization targets with mobility     Vision   Additional Comments: pt did not report diplopia this visit, but had with PT, made vision occlusion glasses and instructed in use during mobility      Perception     Praxis      Pertinent Vitals/Pain Pain Assessment: Faces Faces Pain Scale: Hurts little more Pain Location: back Pain Descriptors / Indicators: Aching Pain Intervention(s): Repositioned     Hand Dominance Right   Extremity/Trunk Assessment Upper Extremity Assessment Upper Extremity Assessment: Overall WFL for tasks assessed;LUE deficits/detail LUE Deficits / Details: hx of rotator cuff repair       Cervical / Trunk Assessment Cervical / Trunk  Assessment: Normal(obesity)   Communication Communication Communication: No difficulties   Cognition Arousal/Alertness: Awake/alert Behavior During Therapy: WFL for tasks assessed/performed Overall Cognitive Status: Impaired/Different from baseline Area of Impairment: Safety/judgement                          Safety/Judgement: Decreased awareness of safety     General Comments: pt with decreased knowledge in how to manage her chronic medical conditions, use her glucometer   General Comments       Exercises     Shoulder Instructions      Home Living Family/patient expects to be discharged to:: Private residence Living Arrangements: Alone Available Help at Discharge: Family;Available PRN/intermittently Type of Home: House Home Access: Stairs to enter [employer] of Steps: 2 Entrance Stairs-Rails: None Home Layout: One level     Bathroom Shower/Tub: Teacher, early years/pre: Standard     Home Equipment: Environmental consultant - 2 wheels;Cane - single point   Additional Comments: Plans to go stay with her son at d/c.       Prior Functioning/Environment Level of Independence: Independent with assistive device(s)        Comments: Had been using cane, but only able to tolerate short distances secondary to shortness of breath.         OT Problem List: Decreased activity tolerance;Impaired balance (sitting and/or standing);Decreased knowledge of use of DME or AE;Obesity;Pain;Cardiopulmonary status limiting activity      OT Treatment/Interventions: Self-care/ADL training;DME and/or AE instruction;Patient/family education;Balance training;Therapeutic activities;Energy conservation    OT Goals(Current goals can be found in the care plan section) Acute Rehab OT Goals Patient Stated Goal: to decrease her dizziness OT Goal Formulation: With patient Time For Goal Achievement: 03/16/20 Potential to Achieve Goals: Good ADL Goals Pt Will Perform Grooming: with supervision;standing Pt Will Transfer to Toilet: with supervision;ambulating;bedside commode Pt Will Perform Tub/Shower Transfer: Tub transfer;with supervision;tub bench;rolling walker Additional ADL Goal #1: Pt will use compensatory strategies for dizziness/diplopia  independently. Additional ADL Goal #2: Pt will state at least 3 energy conservation strategies as instructed.  OT Frequency: Min 2X/week   Barriers to D/C:            Co-evaluation              AM-PAC OT "6 Clicks" Daily Activity     Outcome Measure Help from another person eating meals?: None Help from another person taking care of personal grooming?: A Little Help from another person toileting, which includes using toliet, bedpan, or urinal?: A Little Help from another person bathing (including washing, rinsing, drying)?: A Little Help from another person to put on and taking off regular upper body clothing?: None   6 Click Score: 17   End of Session Equipment Utilized During Treatment: Gait belt;Rolling walker  Activity Tolerance: Patient tolerated treatment well Patient left: in bed;with call bell/phone within reach;with bed alarm set  OT Visit Diagnosis: Other abnormalities of gait and mobility (R26.89);Unsteadiness on feet (R26.81);Pain                Time: [id number] OT Time Calculation (min): 39 min Charges:  OT General Charges $OT Visit: 1 Visit OT Evaluation $OT Eval Moderate Complexity: 1 Mod OT Treatments $Self Care/Home Management : 23-37 mins  Nestor Lewandowsky, OTR/L Acute Rehabilitation Services Pager: [phone] Office: [phone]  Malka So 03/02/2020, 10:19 AM

## 2020-03-08 ENCOUNTER — Telehealth: Payer: Self-pay | Admitting: Internal Medicine

## 2020-03-08 DIAGNOSIS — H8110 Benign paroxysmal vertigo, unspecified ear: Secondary | ICD-10-CM | POA: Diagnosis not present

## 2020-03-08 DIAGNOSIS — G4733 Obstructive sleep apnea (adult) (pediatric): Secondary | ICD-10-CM | POA: Diagnosis not present

## 2020-03-08 DIAGNOSIS — I081 Rheumatic disorders of both mitral and tricuspid valves: Secondary | ICD-10-CM | POA: Diagnosis not present

## 2020-03-08 DIAGNOSIS — I10 Essential (primary) hypertension: Secondary | ICD-10-CM | POA: Diagnosis not present

## 2020-03-08 DIAGNOSIS — G8929 Other chronic pain: Secondary | ICD-10-CM | POA: Diagnosis not present

## 2020-03-08 DIAGNOSIS — M2578 Osteophyte, vertebrae: Secondary | ICD-10-CM | POA: Diagnosis not present

## 2020-03-08 DIAGNOSIS — M4802 Spinal stenosis, cervical region: Secondary | ICD-10-CM | POA: Diagnosis not present

## 2020-03-08 DIAGNOSIS — J45909 Unspecified asthma, uncomplicated: Secondary | ICD-10-CM | POA: Diagnosis not present

## 2020-03-08 DIAGNOSIS — M170 Bilateral primary osteoarthritis of knee: Secondary | ICD-10-CM | POA: Diagnosis not present

## 2020-03-08 DIAGNOSIS — M47812 Spondylosis without myelopathy or radiculopathy, cervical region: Secondary | ICD-10-CM | POA: Diagnosis not present

## 2020-03-08 DIAGNOSIS — S065X0D Traumatic subdural hemorrhage without loss of consciousness, subsequent encounter: Secondary | ICD-10-CM | POA: Diagnosis not present

## 2020-03-08 DIAGNOSIS — S0101XD Laceration without foreign body of scalp, subsequent encounter: Secondary | ICD-10-CM | POA: Diagnosis not present

## 2020-03-08 DIAGNOSIS — E039 Hypothyroidism, unspecified: Secondary | ICD-10-CM | POA: Diagnosis not present

## 2020-03-08 DIAGNOSIS — M19011 Primary osteoarthritis, right shoulder: Secondary | ICD-10-CM | POA: Diagnosis not present

## 2020-03-08 DIAGNOSIS — E119 Type 2 diabetes mellitus without complications: Secondary | ICD-10-CM | POA: Diagnosis not present

## 2020-03-08 DIAGNOSIS — I700 Atherosclerosis of aorta: Secondary | ICD-10-CM | POA: Diagnosis not present

## 2020-03-08 NOTE — Telephone Encounter (Signed)
Alexandra Wolfe from [facility] [phone]    Order: Physical therapy one time a week for nine weeks for fall risk reduction   Recommended evaluation by home health nurse due to medication changes and poor diabetes management  Speech therapy for swallowing patient has intermittent chocking    Home health aide for a bathing two times a week for three weeks     Patient reports severe pain, mostly on her knees pain is 9/10   She is not checking her blood sugar, does not know how to use the glucometer    Clarification on medications,  What thyroid medicine  do you want the patient to take?  Keppra new medicine she has pills for tomorrow but will run out after that. Was wanting to know if she should continue taking   Fall with head trauma, having daily headaches with moderate intensity, no change from when she was in the hospital  Due to chronic memory issues the patient forgets to take her medication.

## 2020-03-09 NOTE — Telephone Encounter (Signed)
Called and left message today for Clair Gulling with info provided by Dr. Jenny Reichmann.

## 2020-03-09 NOTE — Telephone Encounter (Signed)
[place] for [employer] for thyroid medication as per d/c summary - there should be no question about this  The Keppra was meant for only a short time post d/c I believe per the d/c summary, so no further refills needed

## 2020-03-13 ENCOUNTER — Observation Stay (HOSPITAL_COMMUNITY): Payer: Medicare Other

## 2020-03-13 ENCOUNTER — Other Ambulatory Visit: Payer: Self-pay

## 2020-03-13 ENCOUNTER — Telehealth: Payer: Self-pay | Admitting: Internal Medicine

## 2020-03-13 ENCOUNTER — Emergency Department (HOSPITAL_COMMUNITY): Payer: Medicare Other

## 2020-03-13 ENCOUNTER — Observation Stay (HOSPITAL_COMMUNITY)
Admission: EM | Admit: 2020-03-13 | Discharge: 2020-03-16 | Disposition: A | Discharge status: Home / Self Care | Payer: Medicare Other | Attending: Internal Medicine | Admitting: Internal Medicine

## 2020-03-13 ENCOUNTER — Encounter (HOSPITAL_COMMUNITY): Payer: Self-pay | Admitting: Emergency Medicine

## 2020-03-13 DIAGNOSIS — G9341 Metabolic encephalopathy: Secondary | ICD-10-CM | POA: Diagnosis not present

## 2020-03-13 DIAGNOSIS — E119 Type 2 diabetes mellitus without complications: Secondary | ICD-10-CM | POA: Diagnosis not present

## 2020-03-13 DIAGNOSIS — S065X9A Traumatic subdural hemorrhage with loss of consciousness of unspecified duration, initial encounter: Secondary | ICD-10-CM

## 2020-03-13 DIAGNOSIS — Z79899 Other long term (current) drug therapy: Secondary | ICD-10-CM | POA: Insufficient documentation

## 2020-03-13 DIAGNOSIS — E785 Hyperlipidemia, unspecified: Secondary | ICD-10-CM | POA: Diagnosis present

## 2020-03-13 DIAGNOSIS — G9340 Encephalopathy, unspecified: Secondary | ICD-10-CM | POA: Diagnosis present

## 2020-03-13 DIAGNOSIS — S065X0A Traumatic subdural hemorrhage without loss of consciousness, initial encounter: Secondary | ICD-10-CM | POA: Diagnosis not present

## 2020-03-13 DIAGNOSIS — W19XXXD Unspecified fall, subsequent encounter: Secondary | ICD-10-CM | POA: Diagnosis not present

## 2020-03-13 DIAGNOSIS — R404 Transient alteration of awareness: Secondary | ICD-10-CM | POA: Diagnosis not present

## 2020-03-13 DIAGNOSIS — R4781 Slurred speech: Secondary | ICD-10-CM | POA: Diagnosis not present

## 2020-03-13 DIAGNOSIS — E876 Hypokalemia: Secondary | ICD-10-CM | POA: Insufficient documentation

## 2020-03-13 DIAGNOSIS — Z87891 Personal history of nicotine dependence: Secondary | ICD-10-CM | POA: Insufficient documentation

## 2020-03-13 DIAGNOSIS — S199XXA Unspecified injury of neck, initial encounter: Secondary | ICD-10-CM | POA: Diagnosis not present

## 2020-03-13 DIAGNOSIS — E039 Hypothyroidism, unspecified: Secondary | ICD-10-CM | POA: Diagnosis present

## 2020-03-13 DIAGNOSIS — R0689 Other abnormalities of breathing: Secondary | ICD-10-CM | POA: Diagnosis not present

## 2020-03-13 DIAGNOSIS — R42 Dizziness and giddiness: Secondary | ICD-10-CM

## 2020-03-13 DIAGNOSIS — S065XAA Traumatic subdural hemorrhage with loss of consciousness status unknown, initial encounter: Secondary | ICD-10-CM | POA: Diagnosis present

## 2020-03-13 DIAGNOSIS — Z20822 Contact with and (suspected) exposure to covid-19: Secondary | ICD-10-CM | POA: Diagnosis not present

## 2020-03-13 DIAGNOSIS — I700 Atherosclerosis of aorta: Secondary | ICD-10-CM | POA: Diagnosis not present

## 2020-03-13 DIAGNOSIS — Z7982 Long term (current) use of aspirin: Secondary | ICD-10-CM | POA: Diagnosis not present

## 2020-03-13 DIAGNOSIS — R29810 Facial weakness: Secondary | ICD-10-CM | POA: Diagnosis not present

## 2020-03-13 DIAGNOSIS — Z7984 Long term (current) use of oral hypoglycemic drugs: Secondary | ICD-10-CM | POA: Insufficient documentation

## 2020-03-13 DIAGNOSIS — J45909 Unspecified asthma, uncomplicated: Secondary | ICD-10-CM | POA: Diagnosis not present

## 2020-03-13 DIAGNOSIS — S065X9D Traumatic subdural hemorrhage with loss of consciousness of unspecified duration, subsequent encounter: Secondary | ICD-10-CM | POA: Diagnosis not present

## 2020-03-13 DIAGNOSIS — F32A Depression, unspecified: Secondary | ICD-10-CM | POA: Diagnosis present

## 2020-03-13 DIAGNOSIS — I10 Essential (primary) hypertension: Secondary | ICD-10-CM | POA: Diagnosis not present

## 2020-03-13 DIAGNOSIS — R4182 Altered mental status, unspecified: Secondary | ICD-10-CM

## 2020-03-13 DIAGNOSIS — R1310 Dysphagia, unspecified: Secondary | ICD-10-CM | POA: Diagnosis not present

## 2020-03-13 DIAGNOSIS — Z03818 Encounter for observation for suspected exposure to other biological agents ruled out: Secondary | ICD-10-CM | POA: Diagnosis not present

## 2020-03-13 DIAGNOSIS — F329 Major depressive disorder, single episode, unspecified: Secondary | ICD-10-CM | POA: Diagnosis not present

## 2020-03-13 LAB — COMPREHENSIVE METABOLIC PANEL
ALT: 28 U/L (ref 0–44)
AST: 33 U/L (ref 15–41)
Albumin: 4.3 g/dL (ref 3.5–5.0)
Alkaline Phosphatase: 66 U/L (ref 38–126)
Anion gap: 18 — ABNORMAL HIGH (ref 5–15)
BUN: 24 mg/dL — ABNORMAL HIGH (ref 8–23)
CO2: 16 mmol/L — ABNORMAL LOW (ref 22–32)
Calcium: 9.9 mg/dL (ref 8.9–10.3)
Chloride: 104 mmol/L (ref 98–111)
Creatinine, Ser: 1.07 mg/dL — ABNORMAL HIGH (ref 0.44–1.00)
GFR calc Af Amer: 59 mL/min — ABNORMAL LOW (ref >60)
GFR calc non Af Amer: 51 mL/min — ABNORMAL LOW (ref >60)
Glucose, Bld: 98 mg/dL (ref 70–99)
Potassium: 4.1 mmol/L (ref 3.5–5.1)
Sodium: 138 mmol/L (ref 135–145)
Total Bilirubin: 0.7 mg/dL (ref 0.3–1.2)
Total Protein: 7.1 g/dL (ref 6.5–8.1)

## 2020-03-13 LAB — DIFFERENTIAL
Abs Immature Granulocytes: 0.03 10*3/uL (ref 0.00–0.07)
Basophils Absolute: 0.1 10*3/uL (ref 0.0–0.1)
Basophils Relative: 1 %
Eosinophils Absolute: 0.2 10*3/uL (ref 0.0–0.5)
Eosinophils Relative: 2 %
Immature Granulocytes: 0 %
Lymphocytes Relative: 33 %
Lymphs Abs: 3 10*3/uL (ref 0.7–4.0)
Monocytes Absolute: 1.1 10*3/uL — ABNORMAL HIGH (ref 0.1–1.0)
Monocytes Relative: 12 %
Neutro Abs: 4.6 10*3/uL (ref 1.7–7.7)
Neutrophils Relative %: 52 %

## 2020-03-13 LAB — I-STAT CHEM 8, ED
BUN: 24 mg/dL — ABNORMAL HIGH (ref 8–23)
Calcium, Ion: 1.2 mmol/L (ref 1.15–1.40)
Chloride: 105 mmol/L (ref 98–111)
Creatinine, Ser: 0.9 mg/dL (ref 0.44–1.00)
Glucose, Bld: 96 mg/dL (ref 70–99)
HCT: 43 % (ref 36.0–46.0)
Hemoglobin: 14.6 g/dL (ref 12.0–15.0)
Potassium: 3.8 mmol/L (ref 3.5–5.1)
Sodium: 137 mmol/L (ref 135–145)
TCO2: 20 mmol/L — ABNORMAL LOW (ref 22–32)

## 2020-03-13 LAB — CBC
HCT: 44.8 % (ref 36.0–46.0)
Hemoglobin: 14.8 g/dL (ref 12.0–15.0)
MCH: 29.6 pg (ref 26.0–34.0)
MCHC: 33 g/dL (ref 30.0–36.0)
MCV: 89.6 fL (ref 80.0–100.0)
Platelets: 381 10*3/uL (ref 150–400)
RBC: 5 MIL/uL (ref 3.87–5.11)
RDW: 13.5 % (ref 11.5–15.5)
WBC: 8.9 10*3/uL (ref 4.0–10.5)
nRBC: 0 % (ref 0.0–0.2)

## 2020-03-13 LAB — GLUCOSE, CAPILLARY: Glucose-Capillary: 89 mg/dL (ref 70–99)

## 2020-03-13 LAB — PROTIME-INR
INR: 1 (ref 0.8–1.2)
Prothrombin Time: 13.2 seconds (ref 11.4–15.2)

## 2020-03-13 LAB — APTT: aPTT: 27 seconds (ref 24–36)

## 2020-03-13 LAB — TSH: TSH: 4.791 u[IU]/mL — ABNORMAL HIGH (ref 0.350–4.500)

## 2020-03-13 LAB — SARS CORONAVIRUS 2 (TAT 6-24 HRS): SARS Coronavirus 2: NEGATIVE

## 2020-03-13 LAB — HEMOGLOBIN A1C
Hgb A1c MFr Bld: 5.9 % — ABNORMAL HIGH (ref 4.8–5.6)
Mean Plasma Glucose: 122.63 mg/dL

## 2020-03-13 LAB — T4, FREE: Free T4: 0.78 ng/dL (ref 0.61–1.12)

## 2020-03-13 IMAGING — CT CT HEAD W/O CM
4 series · 16 of 47 positions shown, 18 images · non-contrast
Comparison: Head CT [DATE] and cervical spine CT [DATE]

CLINICAL DATA: Possible stroke. Aphasia with increased respiratory
rate. Fall yesterday.

EXAM:
CT HEAD WITHOUT CONTRAST
CT CERVICAL SPINE WITHOUT CONTRAST
TECHNIQUE: Multidetector CT imaging of the head and cervical spine was
performed following the standard protocol without intravenous
contrast. Multiplanar CT image reconstructions of the cervical spine
were also generated.

[Series 3: head bone · axial · 0.45mm/px · z∈[-164,-132]mm · 3 of 82 slices shown]
[im 9/82  bone]
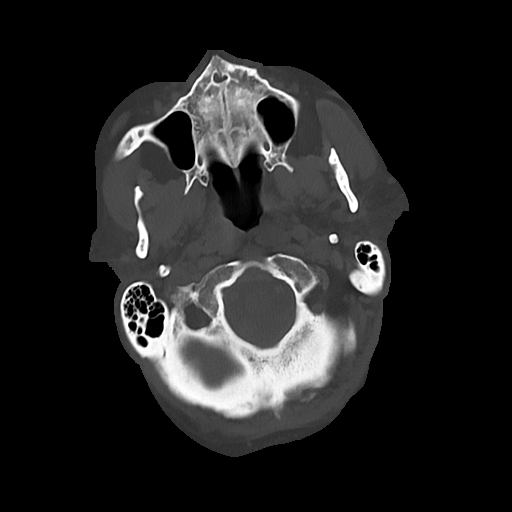
[im 17/82  bone]
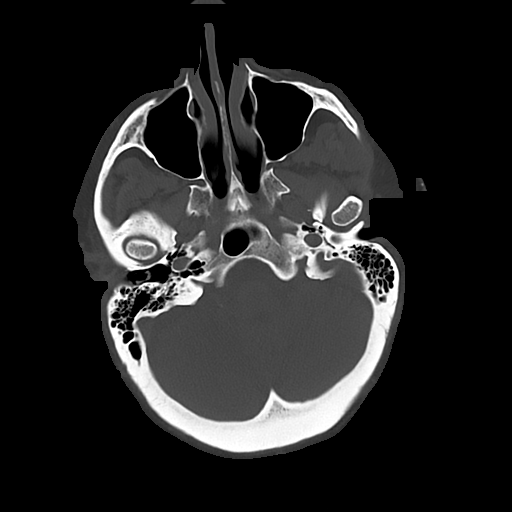
[im 25/82  bone]
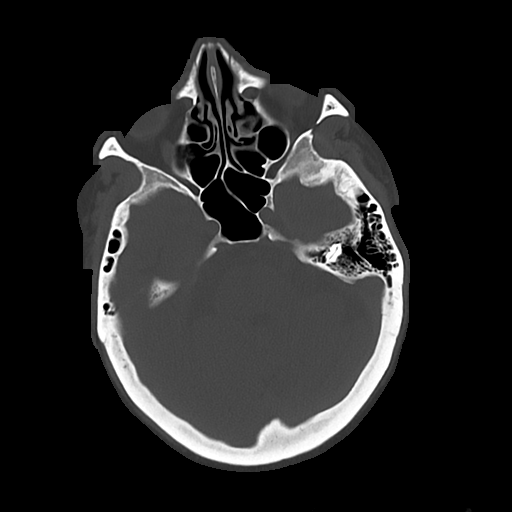

[Series 6: head wo · axial · 0.44mm/px · z∈[-160,-40]mm · 7 of 33 slices shown, 9 images]
[im 5/33  brain]
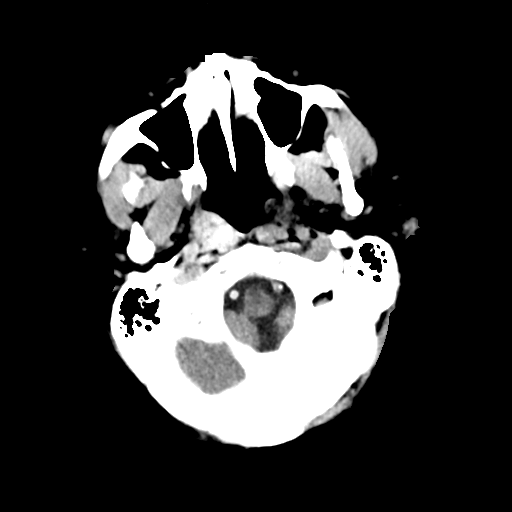
[im 5/33  bone]
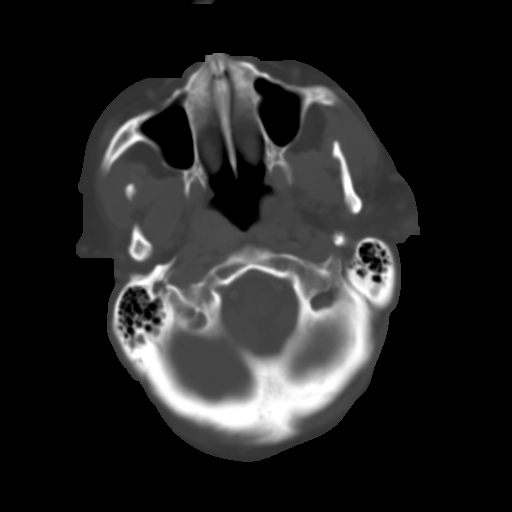
[im 9/33  brain]
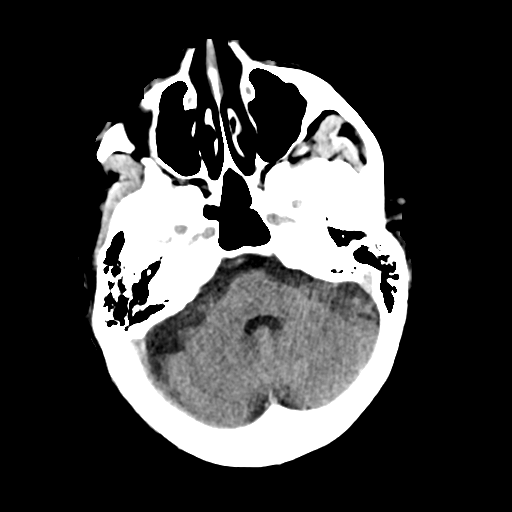
[im 13/33  brain]
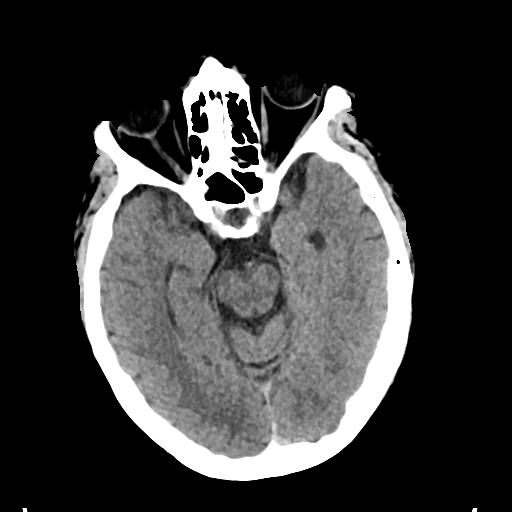
[im 17/33  brain]
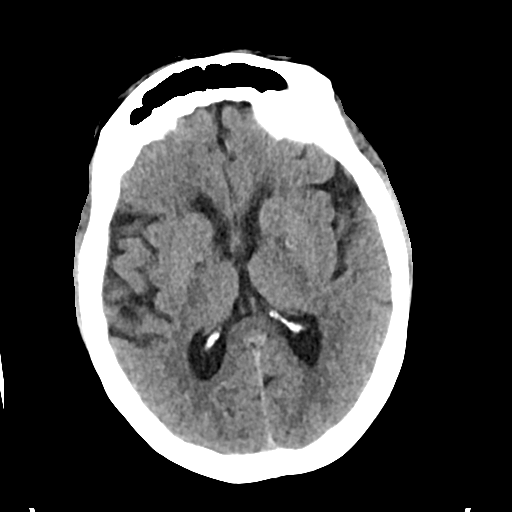
[im 21/33  brain]
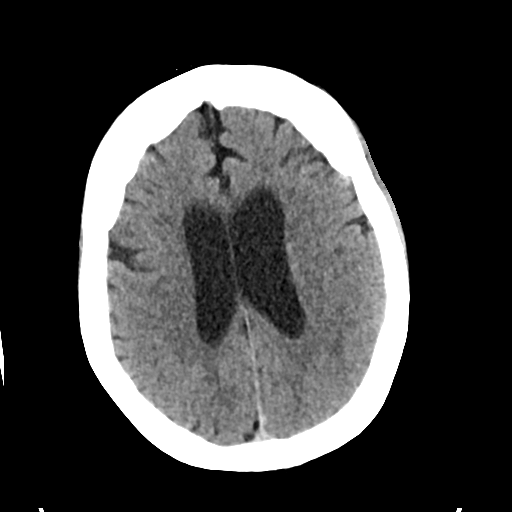
[im 21/33  bone]
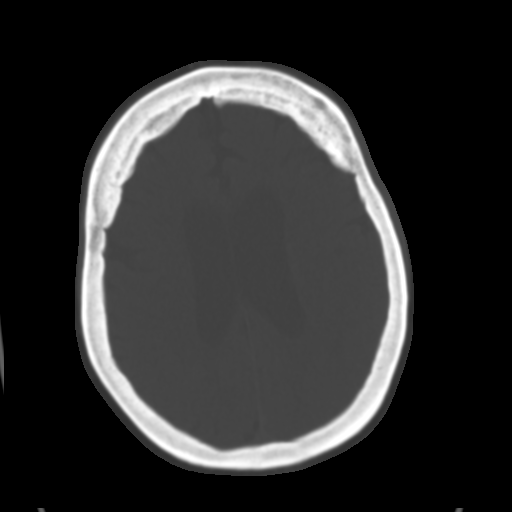
[im 25/33  brain]
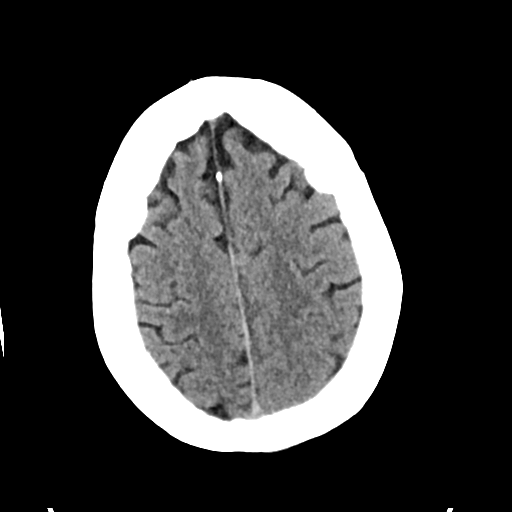
[im 29/33  brain]
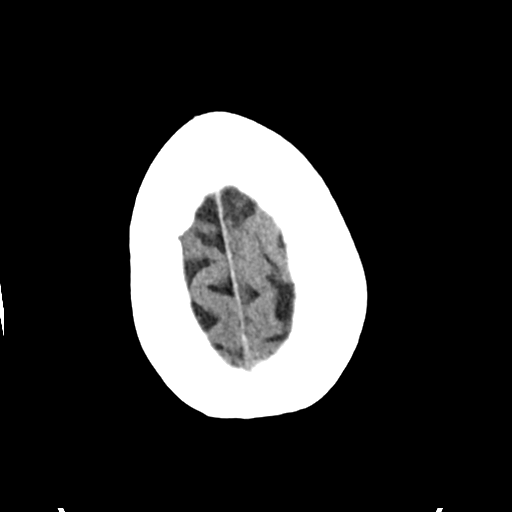

[Series 7: cor soft · coronal · 0.35mm/px · 3 of 71 slices shown]
[im 24/71  brain]
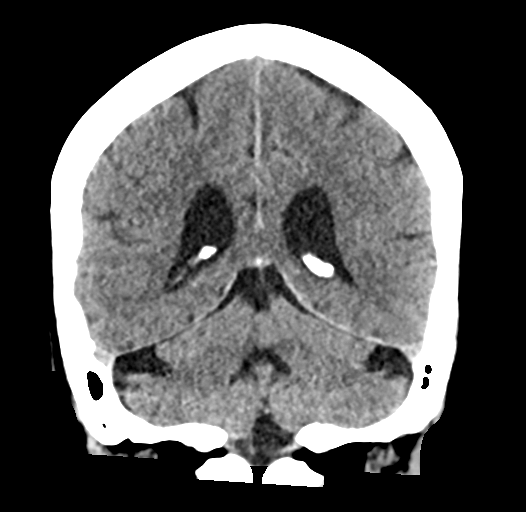
[im 32/71  brain]
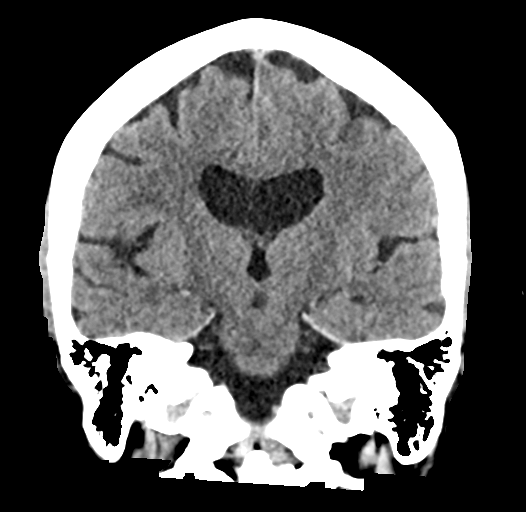
[im 39/71  brain]
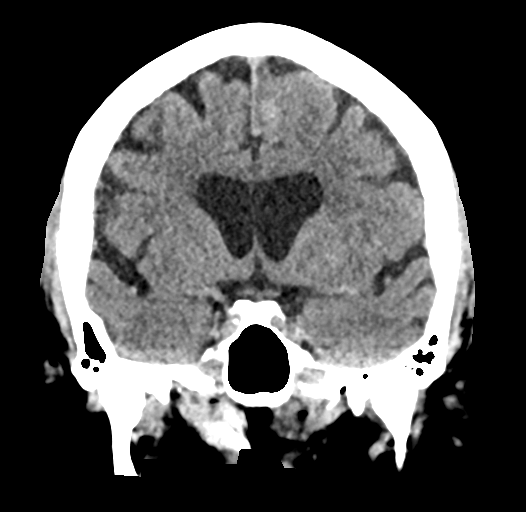

[Series 8: sag soft · sagittal · 0.35mm/px · 3 of 61 slices shown]
[im 21/61  brain]
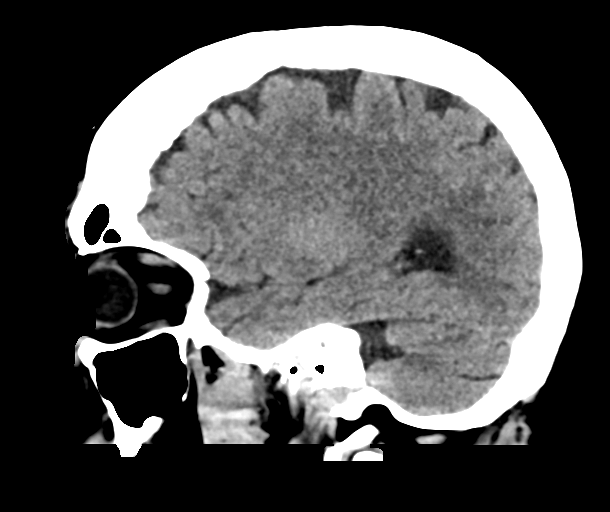
[im 31/61  brain]
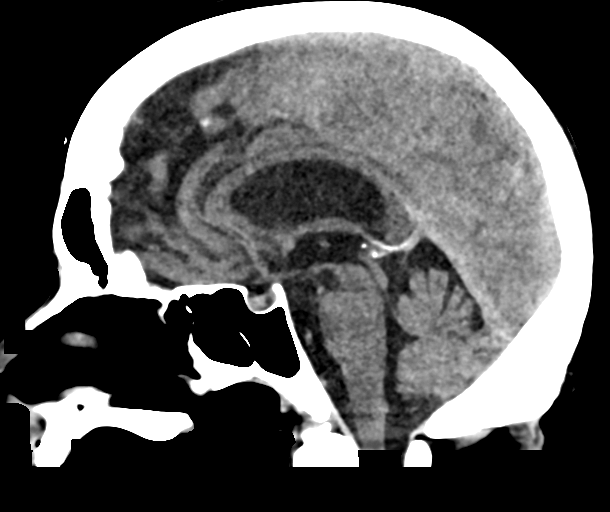
[im 41/61  brain]
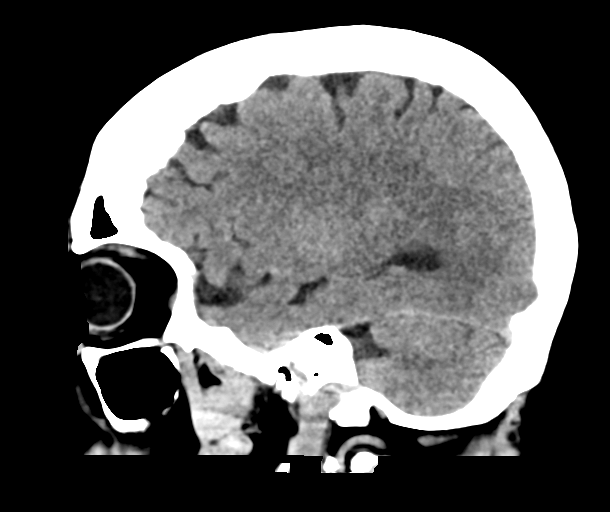

[16 of 47 positions shown; findings below may reference images not displayed]

FINDINGS: CT HEAD FINDINGS

Brain: There is a small residual subdural hematoma along the left
aspect of the falx which has decreased in size and density compared
to the prior study and now measures up to 9 mm in thickness
(previously 10 mm). There is minimal residual subdural blood
layering along the left tentorium. No new intracranial hemorrhage,
acute infarct, mass, or midline shift is identified. Mild cerebral
atrophy is within normal limits for age. Hypodensities in the
cerebral white matter bilaterally are nonspecific but compatible
with mild chronic small vessel ischemic disease.

Vascular: Calcified atherosclerosis at the skull base. No hyperdense
vessel.

Skull: No fracture or suspicious osseous lesion.

Sinuses/Orbits: Paranasal sinuses and mastoid air cells are clear.
Bilateral cataract extraction.

Other: None.

CT CERVICAL SPINE FINDINGS

Alignment: Cervical spine straightening. Trace anterolisthesis of C7
on T1, likely degenerative and facet mediated.

Skull base and vertebrae: No acute fracture or suspicious osseous
lesion.

Soft tissues and spinal canal: No prevertebral fluid or swelling. No
visible canal hematoma.

Disc levels: Unchanged disc degeneration including advanced disc
space narrowing, degenerative endplate irregularity, and sclerosis
at C5-6 and C6-7. Severe right-sided facet arthrosis at C2-3 and
C4-5. Moderate to severe neural foraminal stenosis on the left at
C3-4 and C5-6 due to uncovertebral spurring.

Upper chest: Clear lung apices.

Other: Calcified plaque at the left carotid bifurcation.
IMPRESSION: 1. No evidence of acute intracranial abnormality.
2. Decreased size of left parafalcine subdural hematoma.
3. No acute cervical spine fracture.

## 2020-03-13 IMAGING — CT CT CERVICAL SPINE W/O CM
4 of 5 series · 13 of 33 positions shown, 15 images · non-contrast
Comparison: Head CT [DATE] and cervical spine CT [DATE]

CLINICAL DATA: Possible stroke. Aphasia with increased respiratory
rate. Fall yesterday.

EXAM:
CT HEAD WITHOUT CONTRAST
CT CERVICAL SPINE WITHOUT CONTRAST
TECHNIQUE: Multidetector CT imaging of the head and cervical spine was
performed following the standard protocol without intravenous
contrast. Multiplanar CT image reconstructions of the cervical spine
were also generated.

[Series 5: c spine soft · axial · 0.37mm/px · z∈[-253,-151]mm · 3 of 103 slices shown]
[im 26/103  soft-tissue]
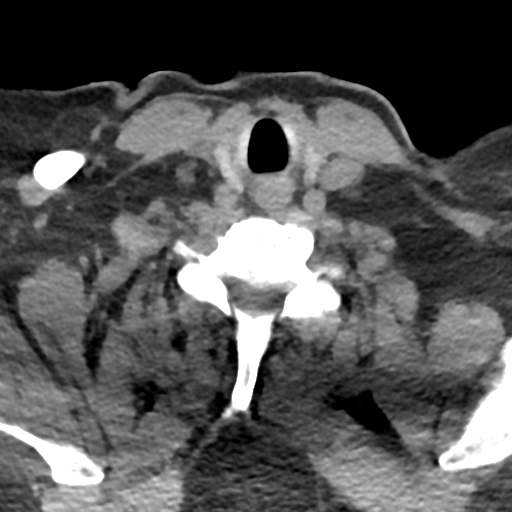
[im 52/103  soft-tissue]
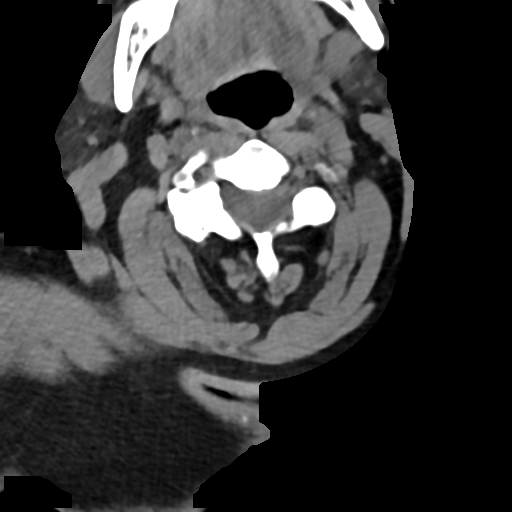
[im 77/103  soft-tissue]
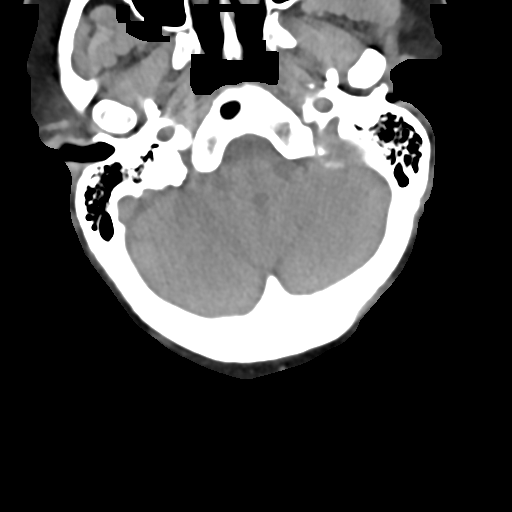

[Series 13: sag bone · sagittal · 0.40mm/px · 5 of 98 slices shown, 6 images]
[im 33/98  bone]
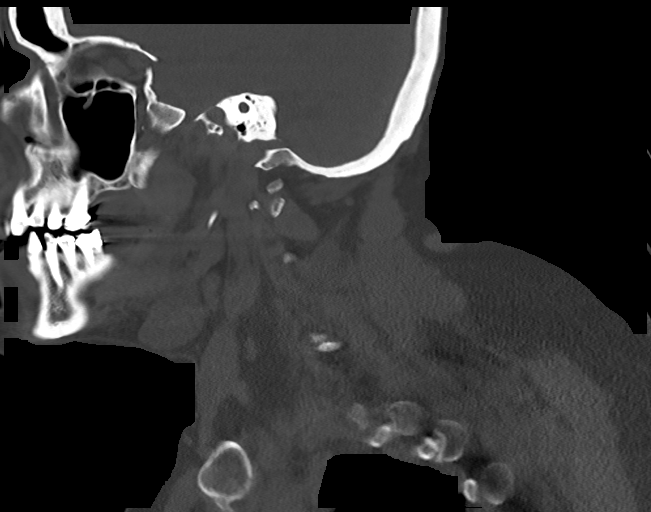
[im 41/98  bone]
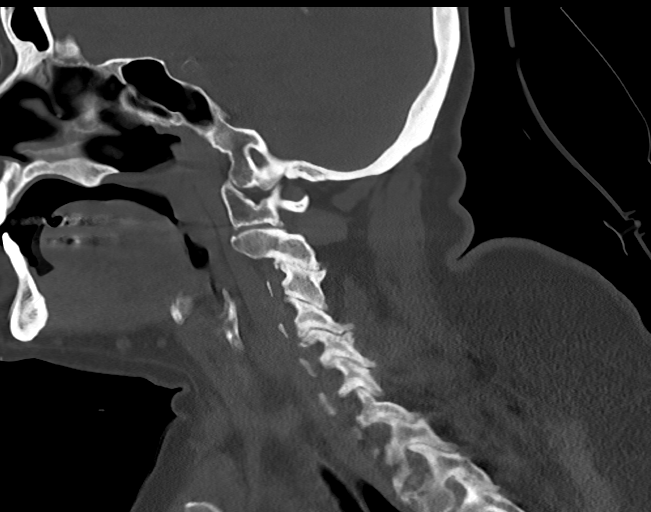
[im 49/98  soft-tissue]
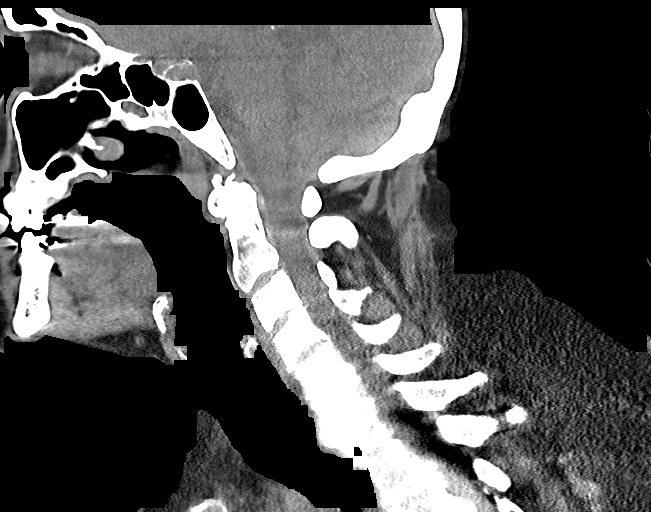
[im 49/98  bone]
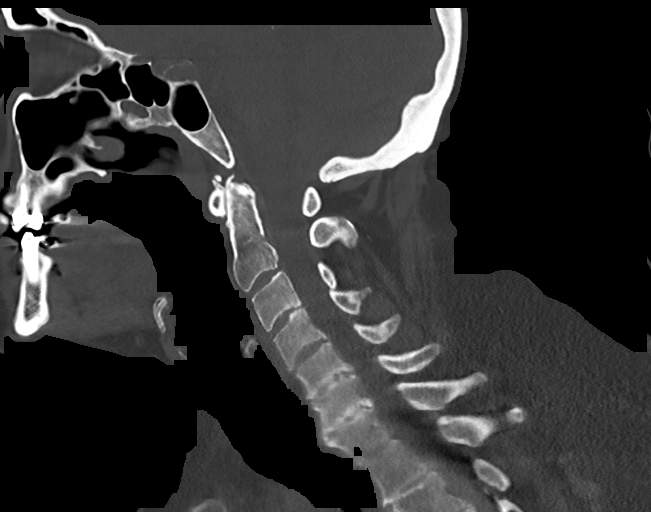
[im 57/98  bone]
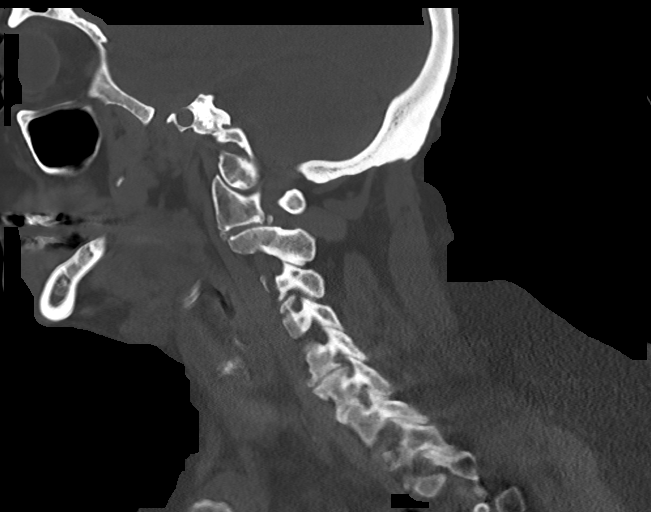
[im 65/98  bone]
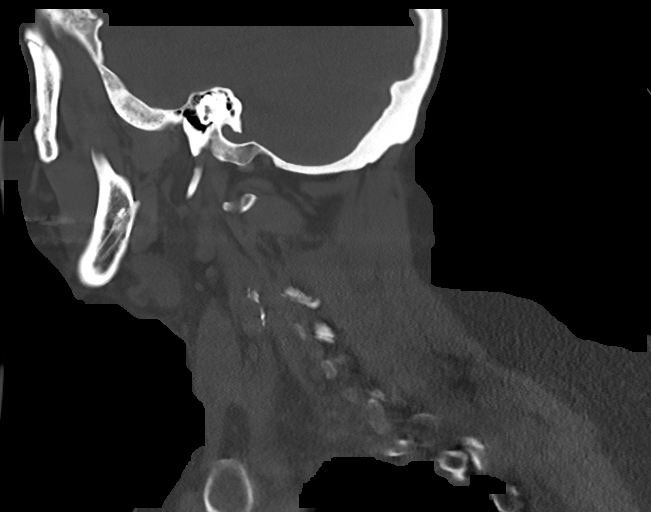

[Series 14: cor bone · coronal · 0.35mm/px · 3 of 90 slices shown]
[im 24/90  bone]
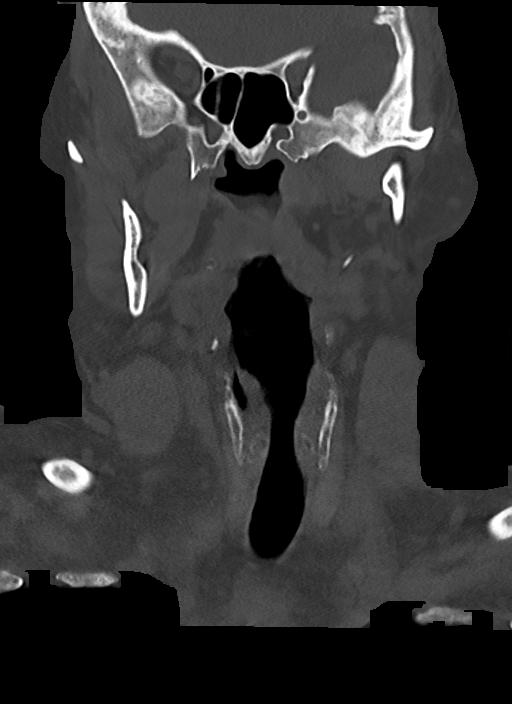
[im 38/90  bone]
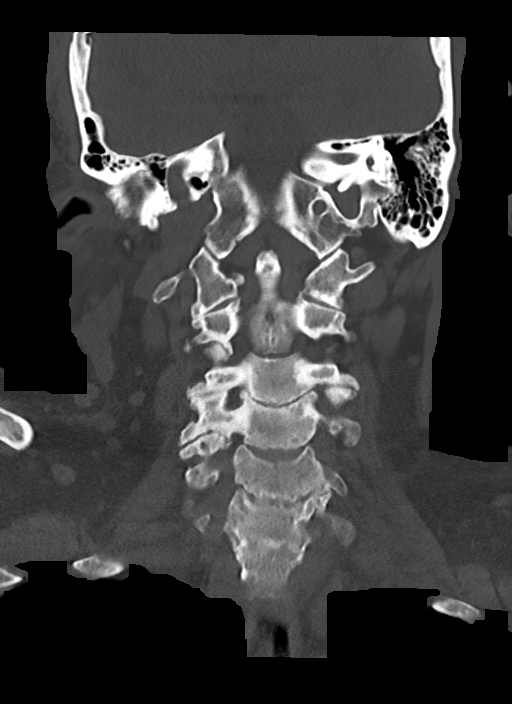
[im 52/90  bone]
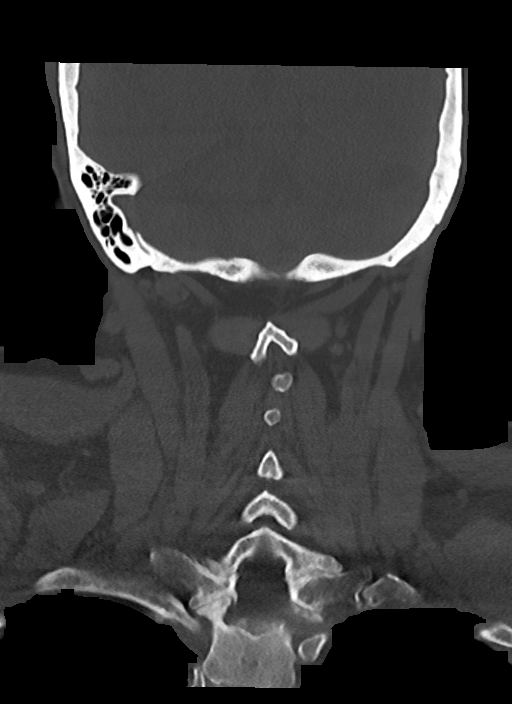

[Series 15: orthogonal axials · axial · 0.21mm/px · z∈[-264,-225]mm · 2 of 81 slices shown, 3 images]
[im 27/81  soft-tissue]
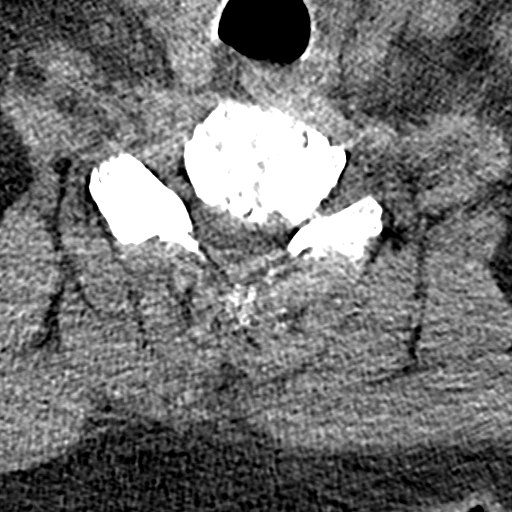
[im 27/81  bone]
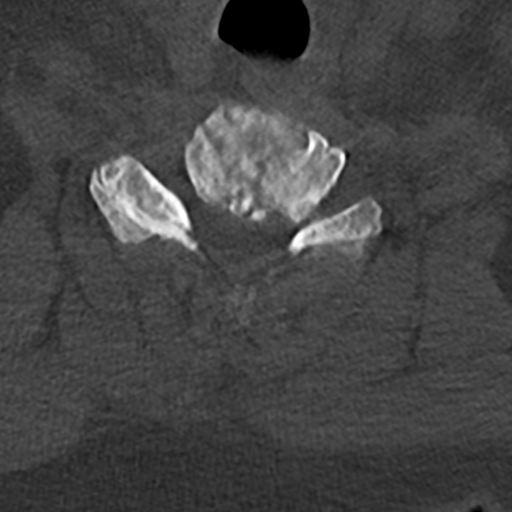
[im 54/81  bone]
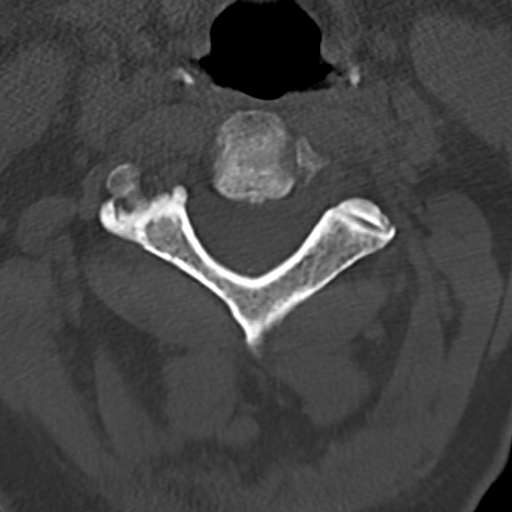

[13 of 33 positions shown; findings below may reference images not displayed]

FINDINGS: CT HEAD FINDINGS

Brain: There is a small residual subdural hematoma along the left
aspect of the falx which has decreased in size and density compared
to the prior study and now measures up to 9 mm in thickness
(previously 10 mm). There is minimal residual subdural blood
layering along the left tentorium. No new intracranial hemorrhage,
acute infarct, mass, or midline shift is identified. Mild cerebral
atrophy is within normal limits for age. Hypodensities in the
cerebral white matter bilaterally are nonspecific but compatible
with mild chronic small vessel ischemic disease.

Vascular: Calcified atherosclerosis at the skull base. No hyperdense
vessel.

Skull: No fracture or suspicious osseous lesion.

Sinuses/Orbits: Paranasal sinuses and mastoid air cells are clear.
Bilateral cataract extraction.

Other: None.

CT CERVICAL SPINE FINDINGS

Alignment: Cervical spine straightening. Trace anterolisthesis of C7
on T1, likely degenerative and facet mediated.

Skull base and vertebrae: No acute fracture or suspicious osseous
lesion.

Soft tissues and spinal canal: No prevertebral fluid or swelling. No
visible canal hematoma.

Disc levels: Unchanged disc degeneration including advanced disc
space narrowing, degenerative endplate irregularity, and sclerosis
at C5-6 and C6-7. Severe right-sided facet arthrosis at C2-3 and
C4-5. Moderate to severe neural foraminal stenosis on the left at
C3-4 and C5-6 due to uncovertebral spurring.

Upper chest: Clear lung apices.

Other: Calcified plaque at the left carotid bifurcation.
IMPRESSION: 1. No evidence of acute intracranial abnormality.
2. Decreased size of left parafalcine subdural hematoma.
3. No acute cervical spine fracture.

## 2020-03-13 IMAGING — DX DG CHEST 1V PORT
1 series · 1 of 1 positions shown · non-contrast
Comparison: [DATE]

CLINICAL DATA: Altered mental status.  Diabetes.  Hyperlipidemia.

EXAM:
PORTABLE CHEST 1 VIEW

[chest ap]
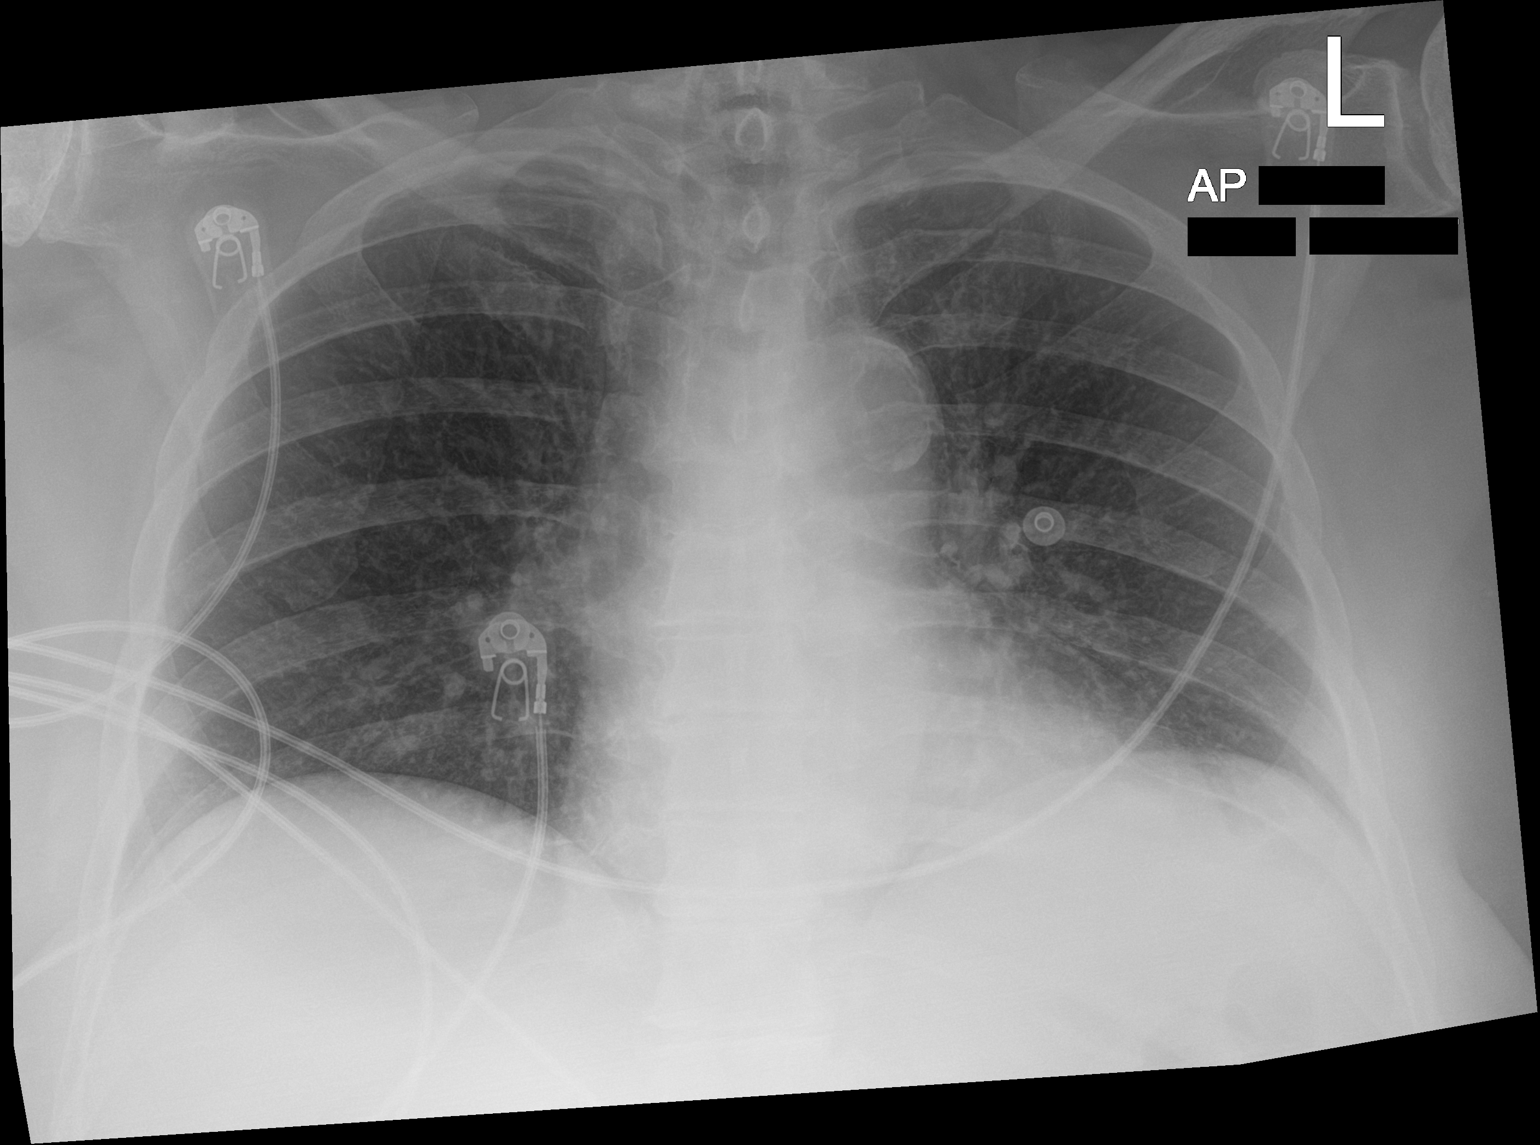

[1 of 1 positions shown; findings below may reference images not displayed]

FINDINGS: Midline trachea. Borderline cardiomegaly. Atherosclerosis in the
transverse aorta. No pleural effusion or pneumothorax. No congestive
failure. Low lung volumes with resultant pulmonary interstitial
prominence. No lobar consolidation.
IMPRESSION: Low lung volumes, without acute disease.

Aortic Atherosclerosis ([75]-[75]).

## 2020-03-13 MED ORDER — STROKE: EARLY STAGES OF RECOVERY BOOK
Freq: Once | Status: AC
  Filled 2020-03-13: qty 1

## 2020-03-13 MED ORDER — LEVETIRACETAM 500 MG PO TABS
500.0000 mg | ORAL_TABLET | Freq: Two times a day (BID) | ORAL | Status: DC
  Administered 2020-03-14 – 2020-03-16 (×5): 500 mg via ORAL
  Filled 2020-03-13 (×5): qty 1

## 2020-03-13 MED ORDER — FLUTICASONE PROPIONATE 50 MCG/ACT NA SUSP
2.0000 | Freq: Every day | NASAL | Status: DC
  Administered 2020-03-14 – 2020-03-16 (×3): 2 via NASAL
  Filled 2020-03-13: qty 16

## 2020-03-13 MED ORDER — HYDROXYZINE HCL 25 MG PO TABS: 25.0000 mg | ORAL_TABLET | Freq: Three times a day (TID) | ORAL | Status: DC | PRN

## 2020-03-13 MED ORDER — INSULIN ASPART 100 UNIT/ML ~~LOC~~ SOLN
0.0000 [IU] | SUBCUTANEOUS | Status: DC
  Administered 2020-03-14 – 2020-03-16 (×4): 1 [IU] via SUBCUTANEOUS

## 2020-03-13 MED ORDER — MOMETASONE FURO-FORMOTEROL FUM 200-5 MCG/ACT IN AERO
2.0000 | INHALATION_SPRAY | Freq: Two times a day (BID) | RESPIRATORY_TRACT | Status: DC
  Administered 2020-03-14 – 2020-03-16 (×4): 2 via RESPIRATORY_TRACT
  Filled 2020-03-13: qty 8.8

## 2020-03-13 MED ORDER — ACETAMINOPHEN 325 MG PO TABS
650.0000 mg | ORAL_TABLET | ORAL | Status: DC | PRN
  Administered 2020-03-14 – 2020-03-16 (×2): 650 mg via ORAL
  Filled 2020-03-13 (×2): qty 2

## 2020-03-13 MED ORDER — ALBUTEROL SULFATE (2.5 MG/3ML) 0.083% IN NEBU: 3.0000 mL | INHALATION_SOLUTION | Freq: Four times a day (QID) | RESPIRATORY_TRACT | Status: DC | PRN

## 2020-03-13 MED ORDER — MIRABEGRON ER 25 MG PO TB24
50.0000 mg | ORAL_TABLET | Freq: Every day | ORAL | Status: DC
  Administered 2020-03-14 – 2020-03-15 (×2): 50 mg via ORAL
  Filled 2020-03-13 (×2): qty 2

## 2020-03-13 MED ORDER — SENNOSIDES-DOCUSATE SODIUM 8.6-50 MG PO TABS: 1.0000 | ORAL_TABLET | Freq: Every evening | ORAL | Status: DC | PRN

## 2020-03-13 MED ORDER — ACETAMINOPHEN 160 MG/5ML PO SOLN: 650.0000 mg | ORAL | Status: DC | PRN

## 2020-03-13 MED ORDER — SODIUM CHLORIDE 0.9 % IV SOLN: INTRAVENOUS | Status: DC

## 2020-03-13 MED ORDER — ATORVASTATIN CALCIUM 80 MG PO TABS
80.0000 mg | ORAL_TABLET | Freq: Every day | ORAL | Status: DC
  Administered 2020-03-14 – 2020-03-16 (×3): 80 mg via ORAL
  Filled 2020-03-13 (×3): qty 1

## 2020-03-13 MED ORDER — LORATADINE 10 MG PO TABS
10.0000 mg | ORAL_TABLET | Freq: Every day | ORAL | Status: DC
  Administered 2020-03-14 – 2020-03-16 (×3): 10 mg via ORAL
  Filled 2020-03-13 (×3): qty 1

## 2020-03-13 MED ORDER — THYROID 30 MG PO TABS
30.0000 mg | ORAL_TABLET | Freq: Every day | ORAL | Status: DC
  Administered 2020-03-14 – 2020-03-16 (×3): 30 mg via ORAL
  Filled 2020-03-13 (×3): qty 1

## 2020-03-13 MED ORDER — SODIUM CHLORIDE 0.9% FLUSH
3.0000 mL | Freq: Once | INTRAVENOUS | Status: AC
  Administered 2020-03-13: 3 mL via INTRAVENOUS

## 2020-03-13 MED ORDER — ADULT MULTIVITAMIN W/MINERALS CH
1.0000 | ORAL_TABLET | Freq: Every day | ORAL | Status: DC
  Administered 2020-03-14 – 2020-03-16 (×3): 1 via ORAL
  Filled 2020-03-13 (×3): qty 1

## 2020-03-13 MED ORDER — ACETAMINOPHEN 650 MG RE SUPP: 650.0000 mg | RECTAL | Status: DC | PRN

## 2020-03-13 NOTE — Plan of Care (Signed)
Pt unable to be educated on stroke care due to cognitive state at this time.

## 2020-03-13 NOTE — ED Triage Notes (Signed)
Arrived via EMS from home. Patient on phone with health care service. Sent a representative out who arrived at same time as son. Patient altered with asphasia with respiratory rate 36. Patient also fell yesterday hitting her head. Son told EMS patient breaths fast on exertion not just by sitting. LKW yesterday 1500 LVO negative.

## 2020-03-13 NOTE — Telephone Encounter (Signed)
Alexandra Wolfe from [facility] and said that the patient has gone to [facility] by ambulance and is having limited verbal responses and communication. She is not responding to language, seems more confused than usual.    [phone]

## 2020-03-13 NOTE — Consult Note (Signed)
Neurology Consultation  Reason for Consult: Altered mental status with abnormal speech Referring Physician: Cristal Ford, DO CC: Altered mental status with abnormal speech  History is obtained from: Chart and son at bedside  HPI: Alexandra Wolfe is a 74 y.o. female with history of left parafalcine subdural hematoma, SVT, hyperlipidemia, depression, anxiety, diabetes.  Patient was last known normal yesterday at 1500 hrs.  EMS was called secondary to patient's healthcare service representative and son expressing the fact that the patient was having more slurred speech and stuttering.  She was also seen to be breathing harder than usual.  Patient was recently seen for a parafalcine subdural hematoma and was placed on Keppra with repeat CT today showing resolution of the subdural hematoma.  On consultation patient is able to follow some commands however is breathing very hard and babbling.  All history is obtained from son.  There is question if patient might have fallen again however C-spine showed no abnormalities in c-collar was removed.     ED course  CT head shows-no evidence of acute intracranial abnormality.  Decreased size of left parafalcine subdural hematoma  Chart review-on 02/29/2020 patient had a fall which resulted in a subdural hematoma, patient was placed on Keppra and aspirin was held for a week.  LKW: 1500 hrs. on 03/13/2020  Past Medical History:  Diagnosis Date  . ALLERGIC RHINITIS 04/02/2009   no per pt  . Anxiety   . BACK PAIN 09/27/2008  . BUNIONS, BILATERAL 12/23/2007  . CHEST DISCOMFORT, ATYPICAL 11/07/2009  . Chronic LBP   . COLONIC POLYPS, HX OF 09/13/2007  . CONSTIPATION 09/27/2008  . DEPRESSION 09/13/2007  . Diabetes mellitus    diet controlled  . DYSPNEA ON EXERTION 02/06/2010  . Eustachian tube dysfunction 05/20/2011  . FOOT PAIN, RIGHT 09/27/2008  . HYPERLIPIDEMIA 03/11/2008  . LBP (low back pain) 05/20/2011  . Leukocytosis 11/20/2011  . OSTEOARTHROSIS NOS,  LOWER LEG 09/13/2007  . SHOULDER PAIN, LEFT 02/14/2009  . SVT (supraventricular tachycardia) ([place])   . SYMPTOM, PALPITATIONS 09/13/2007  . URINARY INCONTINENCE 08/23/2009  . Vertigo 05/20/2011    Family History  Problem Relation Age of Onset  . Colon cancer Mother   . Heart disease Father   . Diabetes type II Father   . Heart disease Brother   . Diabetes Other        father  . Esophageal cancer Neg Hx   . Stomach cancer Neg Hx   . Rectal cancer Neg Hx    Social History:   reports that she has quit smoking. She has a 10.00 pack-year smoking history. She has never used smokeless tobacco. She reports that she does not drink alcohol or use drugs.  Medications  Current Facility-Administered Medications:  .  sodium chloride flush (NS) 0.9 % injection 3 mL, 3 mL, Intravenous, Once, Lajean Saver, MD  Current Outpatient Medications:  .  acetaminophen (TYLENOL) 325 MG tablet, Take 2 tablets (650 mg total) by mouth every 6 (six) hours as needed for mild pain or headache., Disp:  , Rfl:  .  albuterol (VENTOLIN HFA) 108 (90 Base) MCG/ACT inhaler, Inhale 2 puffs into the lungs every 6 (six) hours as needed for wheezing or shortness of breath. (Patient not taking: Reported on 02/29/2020), Disp: 18 g, Rfl: 5 .  aspirin 81 MG tablet, Take 1 tablet (81 mg total) by mouth daily., Disp: 30 tablet, Rfl:  .  atorvastatin (LIPITOR) 80 MG tablet, Take 1 tablet (80 mg total) by mouth daily.,  Disp: 90 tablet, Rfl: 3 .  Blood Glucose Monitoring Suppl (ONETOUCH VERIO) w/Device KIT, Use as directed twice daily E11.9 (Patient not taking: Reported on 01/04/2020), Disp: 1 kit, Rfl: 0 .  budesonide-formoterol (SYMBICORT) 160-4.5 MCG/ACT inhaler, Inhale 2 puffs into the lungs 2 (two) times daily., Disp: 1 Inhaler, Rfl: 12 .  Calcium Carbonate (CALCIUM 500 PO), Take 2,000 mg by mouth daily., Disp: , Rfl:  .  cetirizine (ZYRTEC) 10 MG tablet, Take 1 tablet (10 mg total) by mouth daily., Disp: 30 tablet, Rfl: 11 .   diclofenac (VOLTAREN) 75 MG EC tablet, Take 75 mg by mouth 2 (two) times daily., Disp: , Rfl:  .  empagliflozin (JARDIANCE) 25 MG TABS tablet, Take 25 mg by mouth daily., Disp: 90 tablet, Rfl: 3 .  fluticasone (FLONASE) 50 MCG/ACT nasal spray, SHAKE LIQUID AND USE 2 SPRAYS IN EACH NOSTRIL DAILY FOR ALLERGIES (Patient taking differently: Place 2 sprays into both nostrils daily. ), Disp: 16 g, Rfl: 5 .  glipiZIDE (GLUCOTROL XL) 2.5 MG 24 hr tablet, Take 1 tablet (2.5 mg total) by mouth daily with breakfast. (Patient taking differently: Take 2.5 mg by mouth at bedtime. ), Disp: 90 tablet, Rfl: 3 .  hydrochlorothiazide (HYDRODIURIL) 25 MG tablet, TAKE 1/2 TABLET(12.5 MG) BY MOUTH DAILY (Patient taking differently: Take 12.5 mg by mouth daily. ), Disp: 45 tablet, Rfl: 2 .  levETIRAcetam (KEPPRA) 500 MG tablet, Take 1 tablet (500 mg total) by mouth 2 (two) times daily., Disp: 10 tablet, Rfl: 0 .  meclizine (ANTIVERT) 25 MG tablet, Take 1 tablet (25 mg total) by mouth 3 (three) times daily as needed for dizziness., Disp: 50 tablet, Rfl: 1 .  metFORMIN (GLUCOPHAGE-XR) 500 MG 24 hr tablet, TAKE 4 TABLETS BY MOUTH EVERY MORNING (Patient taking differently: Take 2,000 mg by mouth every morning. ), Disp: 360 tablet, Rfl: 2 .  metoprolol succinate (TOPROL-XL) 25 MG 24 hr tablet, TAKE 1 TABLET(25 MG) BY MOUTH DAILY (Patient taking differently: Take 25 mg by mouth daily. ), Disp: 90 tablet, Rfl: 1 .  Multiple Vitamin (MULTIVITAMIN WITH MINERALS) TABS tablet, Take 1 tablet by mouth daily., Disp:  , Rfl:  .  MYRBETRIQ 50 MG TB24 tablet, Take 50 mg by mouth at bedtime. , Disp: , Rfl:  .  NP THYROID 30 MG tablet, Take 30 mg by mouth daily., Disp: , Rfl:  .  ondansetron (ZOFRAN) 4 MG tablet, Take 4 mg by mouth every 8 (eight) hours as needed for nausea or vomiting., Disp: , Rfl:  .  traZODone (DESYREL) 50 MG tablet, TAKE 1 TABLET(50 MG) BY MOUTH AT BEDTIME (Patient taking differently: Take 50 mg by mouth at bedtime. ),  Disp: 30 tablet, Rfl: 5 .  Vitamin D, Ergocalciferol, (DRISDOL) 1.25 MG (50000 UT) CAPS capsule, Take 1 capsule (50,000 Units total) by mouth every 7 (seven) days. (Patient not taking: Reported on 02/29/2020), Disp: 12 capsule, Rfl: 0  ROS: Unable to obtain due to altered mental status.    Exam: Current vital signs: BP (!) 152/110   Pulse 100   Temp 97.7 F (36.5 C) (Oral)   Resp (!) 38   Ht 5' 8"  (1.727 m)   Wt 110 kg   SpO2 100%   BMI 36.87 kg/m  Vital signs in last 24 hours: Temp:  [97.7 F (36.5 C)] 97.7 F (36.5 C) (04/05 1431) Pulse Rate:  [100] 100 (04/05 1431) Resp:  [38] 38 (04/05 1431) BP: (152)/(110) 152/110 (04/05 1431) SpO2:  [100 %] 100 % (  04/05 1431) Weight:  [110 kg] 110 kg (04/05 1433)   Constitutional: Appears well-developed and well-nourished.  Eyes: No scleral injection HENT: No OP obstrucion Head: Normocephalic.  Cardiovascular: Normal rate and regular rhythm.  Respiratory: Effort normal, non-labored breathing GI: Soft.  No distension. There is no tenderness.  Skin: WDI  Neuro: Mental Status:Patient is awake, alert, speech is noncomprehensible.  She is able to follow visual commands however she will babble and stutter when asked questions. Cranial Nerves: II: Visual Fields are full.  III,IV, VI: EOMI without ptosis or diploplia. Pupils equal, round and reactive to light V: Facial sensation is symmetric to temperature VII: Facial movement is symmetric.  VIII: hearing is intact to voice X: Palat elevates symmetrically XI: Shoulder shrug is symmetric. XII: tongue is midline without atrophy or fasciculations.  Motor: Withdraws from pain in all 4 extremities with good strength Sensory: Draws from pain in all extremities to noxious stimuli Deep Tendon Reflexes: 2+ bilaterally upper extremities no knee jerk or ankle jerk Plantars: Toes are downgoing bilaterally.  Cerebellar: No dysmetria noted when moving fingers from nose to my hand  Labs I  have reviewed labs in epic and the results pertinent to this consultation are:   CBC    Component Value Date/Time   WBC 8.9 03/13/2020 1436   RBC 5.00 03/13/2020 1436   HGB 14.6 03/13/2020 1511   HGB 15.0 12/11/2012 1336   HCT 43.0 03/13/2020 1511   HCT 44.5 12/11/2012 1336   PLT 381 03/13/2020 1436   PLT 346 12/11/2012 1336   MCV 89.6 03/13/2020 1436   MCV 91.5 12/11/2012 1336   MCH 29.6 03/13/2020 1436   MCHC 33.0 03/13/2020 1436   RDW 13.5 03/13/2020 1436   RDW 13.2 12/11/2012 1336   LYMPHSABS 3.0 03/13/2020 1436   LYMPHSABS 3.5 (H) 12/11/2012 1336   MONOABS 1.1 (H) 03/13/2020 1436   MONOABS 1.0 (H) 12/11/2012 1336   EOSABS 0.2 03/13/2020 1436   EOSABS 0.1 12/11/2012 1336   BASOSABS 0.1 03/13/2020 1436   BASOSABS 0.1 12/11/2012 1336    CMP     Component Value Date/Time   NA 137 03/13/2020 1511   NA 147 (H) 12/11/2012 1336   K 3.8 03/13/2020 1511   K 4.9 12/11/2012 1336   CL 105 03/13/2020 1511   CL 104 12/11/2012 1336   CO2 16 (L) 03/13/2020 1436   CO2 25 12/11/2012 1336   GLUCOSE 96 03/13/2020 1511   GLUCOSE 74 12/11/2012 1336   BUN 24 (H) 03/13/2020 1511   BUN 16.0 12/11/2012 1336   CREATININE 0.90 03/13/2020 1511   CREATININE 0.9 12/11/2012 1336   CALCIUM 9.9 03/13/2020 1436   CALCIUM 10.1 12/11/2012 1336   PROT 7.1 03/13/2020 1436   PROT 7.4 12/11/2012 1336   ALBUMIN 4.3 03/13/2020 1436   ALBUMIN 4.0 12/11/2012 1336   AST 33 03/13/2020 1436   AST 17 12/11/2012 1336   ALT 28 03/13/2020 1436   ALT 13 12/11/2012 1336   ALKPHOS 66 03/13/2020 1436   ALKPHOS 97 12/11/2012 1336   BILITOT 0.7 03/13/2020 1436   BILITOT 0.45 12/11/2012 1336   GFRNONAA 51 (L) 03/13/2020 1436   GFRAA 59 (L) 03/13/2020 1436    Lipid Panel     Component Value Date/Time   CHOL 137 01/04/2020 1429   TRIG 214.0 (H) 01/04/2020 1429   HDL 37.90 (L) 01/04/2020 1429   CHOLHDL 4 01/04/2020 1429   VLDL 42.8 (H) 01/04/2020 1429   LDLCALC 66 03/04/2018 1418  LDLDIRECT 74.0  01/04/2020 1429     Imaging I have reviewed the images obtained:  CT head shows-no evidence of acute intracranial abnormality.  Decreased size of left parafalcine subdural hematoma   Etta Quill PA-C [facility] [phone]  M-F  (9:00 am- 5:00 PM)  03/13/2020, 5:45 PM     Assessment:  74 year old female with recent subdural hematoma affecting left parafalcine region.  Patient now presenting with new onset of abnormal speech and confusion.  Exam is notable for stuttering speech but the ability to follow some visual commands.  CT head shows no acute intracranial abnormality.  Given the patient has had a recent subdural hematoma cannot rule out possible seizure however I do believe that there is a functional component within this exam.  I do also believe there is an anxiety component to her exam.  Also given her stroke risk factors would obtain MRI to rule out stroke.  Impression: -Confusion -Stuttering speech -Anxiety Recommendations: -MRI brain without contrast to completely rule out stroke. The rest of the stroke work-up is recently been done last month. -Vistaril  NEUROHOSPITALIST ADDENDUM Performed a face to face diagnostic evaluation.   I have reviewed the contents of history and physical exam as documented by PA/ARNP/Resident and agree with above documentation.  I have discussed and formulated the above plan as documented. Edits to the note have been made as needed.  74 year old female with past medical history of left parafalcine subdural hematoma, anxiety-presents to the emergency department after home health PT noticed patient was babbling her speech since 1 PM this afternoon.  She also appears to be hyperventilating at times, something patient has been doing for years with no explanation according to the son.  On my examination, she intermittently follows commands.  Has no other focal neurological deficits.  When asked to read-she can babble to first few  letters of a word.  Suspect this is anxiety related.     Karena Addison Keiron Iodice MD [facility] [phone]   If 7pm to 7am, please call on call as listed on AMION.

## 2020-03-13 NOTE — Progress Notes (Signed)
Received call from MRI stating that patient was SOB and heart rate in the 30s. When patient arrived to room I reassessed. No SOB 100% on room air. HR 76 NSR. Normal unlabored breathing. No s/sx of distress and no needs verbalized.

## 2020-03-13 NOTE — H&P (Addendum)
[facility] History and Physical  SKYLEY GRANDMAISON YIF:[id number] DOB: Jan 30, 1946 DOA: 03/13/2020  PCP: Biagio Borg, MD  Patient coming from: Home  Chief Complaint: Altered mental status  HPI: Alexandra Wolfe is a 74 y.o. female with a medical history of tension, diabetes mellitus type 2, hyperlipidemia, recent fall with subdural hematoma, who presented to the emergency department with complaints of altered mental status.  Information detailed in this H&P obtained from EDP and son via phone.  It seems that patient fell yesterday and was noted to have worsening aphasia that started today. Son states she was speaking normal yesterday.  She was recently admitted on 02/29/2020 through 03/02/2020 for subdural hematoma and was found to have vertigo with BPPV. Patient was discharged home with home health services.  Son states patient has not had any complaints of any type of chest pain or shortness of breath, abdominal pain, nausea or vomiting, diarrhea constipation, headaches, changes in urinary frequency as of yesterday.  ED Course: Patient with altered mental status and aphasia.  CT head obtained and unremarkable for acute intracranial normalities.  TRH called for admission.  Review of Systems:  Patient currently altered, review of systems obtained from son as stated above.  Past Medical History:  Diagnosis Date  . ALLERGIC RHINITIS 04/02/2009   no per pt  . Anxiety   . BACK PAIN 09/27/2008  . BUNIONS, BILATERAL 12/23/2007  . CHEST DISCOMFORT, ATYPICAL 11/07/2009  . Chronic LBP   . COLONIC POLYPS, HX OF 09/13/2007  . CONSTIPATION 09/27/2008  . DEPRESSION 09/13/2007  . Diabetes mellitus    diet controlled  . DYSPNEA ON EXERTION 02/06/2010  . Eustachian tube dysfunction 05/20/2011  . FOOT PAIN, RIGHT 09/27/2008  . HYPERLIPIDEMIA 03/11/2008  . LBP (low back pain) 05/20/2011  . Leukocytosis 11/20/2011  . OSTEOARTHROSIS NOS, LOWER LEG 09/13/2007  . SHOULDER PAIN, LEFT 02/14/2009  . SVT  (supraventricular tachycardia) ([place])   . SYMPTOM, PALPITATIONS 09/13/2007  . URINARY INCONTINENCE 08/23/2009  . Vertigo 05/20/2011    Past Surgical History:  Procedure Laterality Date  . BUNIONECTOMY     right  . ccx    . CHOLECYSTECTOMY    . LUMBAR LAMINECTOMY    . LUMBAR LAMINECTOMY    . SHOULDER ARTHROSCOPY  12/13/2011   Procedure: ARTHROSCOPY SHOULDER;  Surgeon: Augustin Schooling;  Location: [place];  Service: Orthopedics;  Laterality: Left;  Left Shoulder ArthroscopyDebridement Limited [place] Open Rotator Cuff Repair Spur Removal Right Shoulder Injection     Social History:  reports that she has quit smoking. She has a 10.00 pack-year smoking history. She has never used smokeless tobacco. She reports that she does not drink alcohol or use drugs.  Allergies  Allergen Reactions  . Aripiprazole     REACTION: agitation  . Simvastatin     REACTION: myalgia    Family History  Problem Relation Age of Onset  . Colon cancer Mother   . Heart disease Father   . Diabetes type II Father   . Heart disease Brother   . Diabetes Other        father  . Esophageal cancer Neg Hx   . Stomach cancer Neg Hx   . Rectal cancer Neg Hx      Prior to Admission medications   Medication Sig Start Date End Date Taking? Authorizing Provider  acetaminophen (TYLENOL) 325 MG tablet Take 2 tablets (650 mg total) by mouth every 6 (six) hours as needed for mild pain or headache. 03/02/20  Geradine Girt, DO  albuterol (VENTOLIN HFA) 108 (90 Base) MCG/ACT inhaler Inhale 2 puffs into the lungs every 6 (six) hours as needed for wheezing or shortness of breath. Patient not taking: Reported on 02/29/2020 01/04/20   Biagio Borg, MD  aspirin 81 MG tablet Take 1 tablet (81 mg total) by mouth daily. 03/08/20   Geradine Girt, DO  atorvastatin (LIPITOR) 80 MG tablet Take 1 tablet (80 mg total) by mouth daily. 07/05/19   Biagio Borg, MD  Blood Glucose Monitoring Suppl [facility] VERIO) w/Device KIT Use as directed  twice daily E11.9 Patient not taking: Reported on 01/04/2020 07/01/19   Biagio Borg, MD  budesonide-formoterol [facility]) 160-4.5 MCG/ACT inhaler Inhale 2 puffs into the lungs 2 (two) times daily. 04/03/17   Baird Lyons D, MD  Calcium Carbonate (CALCIUM 500 PO) Take 2,000 mg by mouth daily.    [provider]  cetirizine (ZYRTEC) 10 MG tablet Take 1 tablet (10 mg total) by mouth daily. 01/04/20 01/03/21  Biagio Borg, MD  diclofenac (VOLTAREN) 75 MG EC tablet Take 75 mg by mouth 2 (two) times daily.    [provider]  empagliflozin (JARDIANCE) 25 MG TABS tablet Take 25 mg by mouth daily. 09/03/19   Biagio Borg, MD  fluticasone (FLONASE) 50 MCG/ACT nasal spray SHAKE LIQUID AND USE 2 SPRAYS IN EACH NOSTRIL DAILY FOR ALLERGIES Patient taking differently: Place 2 sprays into both nostrils daily.  05/18/18   Biagio Borg, MD  glipiZIDE (GLUCOTROL XL) 2.5 MG 24 hr tablet Take 1 tablet (2.5 mg total) by mouth daily with breakfast. Patient taking differently: Take 2.5 mg by mouth at bedtime.  07/05/19   Biagio Borg, MD  hydrochlorothiazide (HYDRODIURIL) 25 MG tablet TAKE 1/2 TABLET(12.5 MG) BY MOUTH DAILY Patient taking differently: Take 12.5 mg by mouth daily.  06/16/19   Biagio Borg, MD  levETIRAcetam (KEPPRA) 500 MG tablet Take 1 tablet (500 mg total) by mouth 2 (two) times daily. 03/02/20   Geradine Girt, DO  meclizine (ANTIVERT) 25 MG tablet Take 1 tablet (25 mg total) by mouth 3 (three) times daily as needed for dizziness. 03/04/18   Biagio Borg, MD  metFORMIN (GLUCOPHAGE-XR) 500 MG 24 hr tablet TAKE 4 TABLETS BY MOUTH EVERY MORNING Patient taking differently: Take 2,000 mg by mouth every morning.  02/28/20   Biagio Borg, MD  metoprolol succinate (TOPROL-XL) 25 MG 24 hr tablet TAKE 1 TABLET(25 MG) BY MOUTH DAILY Patient taking differently: Take 25 mg by mouth daily.  11/09/19   Biagio Borg, MD  Multiple Vitamin (MULTIVITAMIN WITH MINERALS) TABS tablet Take 1 tablet by  mouth daily. 03/02/20   Geradine Girt, DO  MYRBETRIQ 50 MG TB24 tablet Take 50 mg by mouth at bedtime.  12/30/19   [provider]  NP THYROID 30 MG tablet Take 30 mg by mouth daily. 02/08/20   [provider]  ondansetron (ZOFRAN) 4 MG tablet Take 4 mg by mouth every 8 (eight) hours as needed for nausea or vomiting.    [provider]  traZODone (DESYREL) 50 MG tablet TAKE 1 TABLET(50 MG) BY MOUTH AT BEDTIME Patient taking differently: Take 50 mg by mouth at bedtime.  12/02/19   Deneise Lever, MD  Vitamin D, Ergocalciferol, (DRISDOL) 1.25 MG (50000 UT) CAPS capsule Take 1 capsule (50,000 Units total) by mouth every 7 (seven) days. Patient not taking: Reported on 02/29/2020 07/05/19   Biagio Borg,  MD    Physical Exam: Vitals:   03/13/20 1431  BP: (!) 152/110  Pulse: 100  Resp: (!) 38  Temp: 97.7 F (36.5 C)  SpO2: 100%     General: Well developed, well nourished, NAD, appears stated age  HEENT: NCAT, PERRLA, EOMI, Anicteic Sclera, mucous membranes moist.   Neck: In brace  Cardiovascular: S1 S2 auscultated, no rubs, murmurs or gallops. Regular rate and rhythm.  Respiratory: Clear to auscultation bilaterally with equal chest rise  Abdomen: Soft, obese, nontender, nondistended, + bowel sounds  Extremities: warm dry without cyanosis clubbing or edema  Neuro: Awake and alert, cannot assess orientation.  Speech appears to be garbled.  Patient not following any commands, cannot assess strength or sensation at this time.  Skin: Without rashes exudates or nodules  Labs on Admission: I have personally reviewed following labs and imaging studies CBC: Recent Labs  Lab 03/13/20 1436 03/13/20 1511  WBC 8.9  --   NEUTROABS 4.6  --   HGB 14.8 14.6  HCT 44.8 43.0  MCV 89.6  --   PLT 381  --    Basic Metabolic Panel: Recent Labs  Lab 03/13/20 1436 03/13/20 1511  NA 138 137  K 4.1 3.8  CL 104 105  CO2 16*  --   GLUCOSE 98 96  BUN 24* 24*    CREATININE 1.07* 0.90  CALCIUM 9.9  --    GFR: Estimated Creatinine Clearance: 71.3 mL/min (by C-G formula based on SCr of 0.9 mg/dL). Liver Function Tests: Recent Labs  Lab 03/13/20 1436  AST 33  ALT 28  ALKPHOS 66  BILITOT 0.7  PROT 7.1  ALBUMIN 4.3   No results for input(s): LIPASE, AMYLASE in the last 168 hours. No results for input(s): AMMONIA in the last 168 hours. Coagulation Profile: Recent Labs  Lab 03/13/20 1436  INR 1.0   Cardiac Enzymes: No results for input(s): CKTOTAL, CKMB, CKMBINDEX, TROPONINI in the last 168 hours. BNP (last 3 results) No results for input(s): PROBNP in the last 8760 hours. HbA1C: No results for input(s): HGBA1C in the last 72 hours. CBG: No results for input(s): GLUCAP in the last 168 hours. Lipid Profile: No results for input(s): CHOL, HDL, LDLCALC, TRIG, CHOLHDL, LDLDIRECT in the last 72 hours. Thyroid Function Tests: No results for input(s): TSH, T4TOTAL, FREET4, T3FREE, THYROIDAB in the last 72 hours. Anemia Panel: No results for input(s): VITAMINB12, FOLATE, FERRITIN, TIBC, IRON, RETICCTPCT in the last 72 hours. Urine analysis:    Component Value Date/Time   COLORURINE YELLOW 03/02/2020 1038   APPEARANCEUR CLEAR 03/02/2020 1038   LABSPEC 1.016 03/02/2020 1038   PHURINE 5.0 03/02/2020 1038   GLUCOSEU >=500 (A) 03/02/2020 1038   GLUCOSEU >=1000 (A) 07/01/2019 1429   HGBUR NEGATIVE 03/02/2020 1038   BILIRUBINUR NEGATIVE 03/02/2020 1038   KETONESUR NEGATIVE 03/02/2020 1038   PROTEINUR NEGATIVE 03/02/2020 1038   UROBILINOGEN 0.2 07/01/2019 1429   NITRITE NEGATIVE 03/02/2020 1038   LEUKOCYTESUR NEGATIVE 03/02/2020 1038   Sepsis Labs: @LABRCNTIP (procalcitonin:4,lacticidven:4) )No results found for this or any previous visit (from the past 240 hour(s)).   Radiological Exams on Admission: CT HEAD WO CONTRAST  Result Date: 03/13/2020 CLINICAL DATA:  Possible stroke. Aphasia with increased respiratory rate. Fall yesterday.  EXAM: CT HEAD WITHOUT CONTRAST CT CERVICAL SPINE WITHOUT CONTRAST TECHNIQUE: Multidetector CT imaging of the head and cervical spine was performed following the standard protocol without intravenous contrast. Multiplanar CT image reconstructions of the cervical spine were also generated. COMPARISON:  Head  CT 03/01/2020 and cervical spine CT 02/29/2020 FINDINGS: CT HEAD FINDINGS Brain: There is a small residual subdural hematoma along the left aspect of the falx which has decreased in size and density compared to the prior study and now measures up to 9 mm in thickness (previously 10 mm). There is minimal residual subdural blood layering along the left tentorium. No new intracranial hemorrhage, acute infarct, mass, or midline shift is identified. Mild cerebral atrophy is within normal limits for age. Hypodensities in the cerebral white matter bilaterally are nonspecific but compatible with mild chronic small vessel ischemic disease. Vascular: Calcified atherosclerosis at the skull base. No hyperdense vessel. Skull: No fracture or suspicious osseous lesion. Sinuses/Orbits: Paranasal sinuses and mastoid air cells are clear. Bilateral cataract extraction. Other: None. CT CERVICAL SPINE FINDINGS Alignment: Cervical spine straightening. Trace anterolisthesis of C7 on T1, likely degenerative and facet mediated. Skull base and vertebrae: No acute fracture or suspicious osseous lesion. Soft tissues and spinal canal: No prevertebral fluid or swelling. No visible canal hematoma. Disc levels: Unchanged disc degeneration including advanced disc space narrowing, degenerative endplate irregularity, and sclerosis at C5-6 and C6-7. Severe right-sided facet arthrosis at C2-3 and C4-5. Moderate to severe neural foraminal stenosis on the left at C3-4 and C5-6 due to uncovertebral spurring. Upper chest: Clear lung apices. Other: Calcified plaque at the left carotid bifurcation. IMPRESSION: 1. No evidence of acute intracranial  abnormality. 2. Decreased size of left parafalcine subdural hematoma. 3. No acute cervical spine fracture. Electronically Signed   By: Logan Bores M.D.   On: 03/13/2020 16:39   CT Cervical Spine Wo Contrast  Result Date: 03/13/2020 CLINICAL DATA:  Possible stroke. Aphasia with increased respiratory rate. Fall yesterday. EXAM: CT HEAD WITHOUT CONTRAST CT CERVICAL SPINE WITHOUT CONTRAST TECHNIQUE: Multidetector CT imaging of the head and cervical spine was performed following the standard protocol without intravenous contrast. Multiplanar CT image reconstructions of the cervical spine were also generated. COMPARISON:  Head CT 03/01/2020 and cervical spine CT 02/29/2020 FINDINGS: CT HEAD FINDINGS Brain: There is a small residual subdural hematoma along the left aspect of the falx which has decreased in size and density compared to the prior study and now measures up to 9 mm in thickness (previously 10 mm). There is minimal residual subdural blood layering along the left tentorium. No new intracranial hemorrhage, acute infarct, mass, or midline shift is identified. Mild cerebral atrophy is within normal limits for age. Hypodensities in the cerebral white matter bilaterally are nonspecific but compatible with mild chronic small vessel ischemic disease. Vascular: Calcified atherosclerosis at the skull base. No hyperdense vessel. Skull: No fracture or suspicious osseous lesion. Sinuses/Orbits: Paranasal sinuses and mastoid air cells are clear. Bilateral cataract extraction. Other: None. CT CERVICAL SPINE FINDINGS Alignment: Cervical spine straightening. Trace anterolisthesis of C7 on T1, likely degenerative and facet mediated. Skull base and vertebrae: No acute fracture or suspicious osseous lesion. Soft tissues and spinal canal: No prevertebral fluid or swelling. No visible canal hematoma. Disc levels: Unchanged disc degeneration including advanced disc space narrowing, degenerative endplate irregularity, and  sclerosis at C5-6 and C6-7. Severe right-sided facet arthrosis at C2-3 and C4-5. Moderate to severe neural foraminal stenosis on the left at C3-4 and C5-6 due to uncovertebral spurring. Upper chest: Clear lung apices. Other: Calcified plaque at the left carotid bifurcation. IMPRESSION: 1. No evidence of acute intracranial abnormality. 2. Decreased size of left parafalcine subdural hematoma. 3. No acute cervical spine fracture. Electronically Signed   By: Seymour Bars.D.  On: 03/13/2020 16:39    EKG: Independently reviewed.  Normal sinus rhythm rate 88  Assessment/Plan Acute encephalopathy, possibly metabolic -Patient presented with aphasia.  Not following any commands. -CT head showed no evidence of acute intercranial normality.  Decreased size of left parafalcine subdural hematoma. -Was admitted on March 23 after falling and noted to have subdural hematoma at that time. -Was discharged on 03/02/2020-during that admission it appears that patient was neurologically intact and was alert and oriented -Patient was started on [place] for seizures -Neurology consulted and appreciated -Have ordered UA as well as chest x-ray to rule out infectious etiology.  However patient currently afebrile with no leukocytosis. -Will order MRI as well as EEG -If MRI is positive, will continue stroke work-up -Of note patient did have echocardiogram on 03/01/2020 which showed an EF of 50 to 55%, mid and basal posterior lateral wall hypokinesis.  LV diastolic parameters normal.  Essential hypertension -Given possibility of CVA, will allow for permissive hypertension at this time -Will hold metoprolol, HCTZ  Diabetes mellitus, type II -Will hold metformin, glipizide, Jardiance -Placed on insulin sliding scale with CBG monitoring  Hyperlipidemia -Continue statin  Hypothyroidism -Continue [place]  Asthma -Currently no wheezing on exam -Continue home medications  Obesity -BMI of 36.87 -We will need to  discuss lifestyle modifications with PCP upon discharge  Recent diagnosis of vertigo/BPPV -Will hold meclizine at this time  DVT prophylaxis: SCDs  Code Status: Full  Family Communication: None at bedside.  Son via phone.  Disposition Plan: Home   Consults called: neurology     Admission status: observation    Time spent: [place] D.O. [facility]  Between 7am to 7pm - Please see pager noted on [email or web address]  After 7pm go to [email or web address] 03/13/2020, 5:37 PM

## 2020-03-13 NOTE — ED Provider Notes (Signed)
[facility] EMERGENCY DEPARTMENT Provider Note   CSN: [id number] Arrival date & time: 03/13/20  1423     History Chief Complaint  Patient presents with  . Altered Mental Status  . Hyperventilating    Alexandra Wolfe is a 74 y.o. female.  The history is provided by the EMS personnel. The history is limited by the condition of the patient.     73 year old female with a past medical history of type 2 diabetes, hypertension, asthma, hypothyroidism, obstructive sleep apnea with CPAP intolerance, recent fall complicated by a subdural hemorrhage presenting to the emergency department with concern for increased altered mental status and tachypnea.  Patient's healthcare service representative and son expressed that the patient is more slurred and aphasic compared to previous.  They also note that she has been breathing rapidly.  They state that she fell yesterday hitting her head.  The patient's last known normal was at 1500 yesterday.  The patient's initial presentation on 02/29/2020 was following a syncopal fall in the shower after the patient had been feeling weak for a couple weeks.  During her inpatient stay her echo showed low normal EF with negative troponins.  Patient has had vertigo in the past.  Patient's vertiginous symptoms improved with meclizine.  Patient was found to have an acute subdural hematoma following a syncopal fall with subsequent antiseizure prophylaxis with Keppra for 7 days and no worsening of her hemorrhage on repeat CT.  Past Medical History:  Diagnosis Date  . ALLERGIC RHINITIS 04/02/2009   no per pt  . Anxiety   . BACK PAIN 09/27/2008  . BUNIONS, BILATERAL 12/23/2007  . CHEST DISCOMFORT, ATYPICAL 11/07/2009  . Chronic LBP   . COLONIC POLYPS, HX OF 09/13/2007  . CONSTIPATION 09/27/2008  . DEPRESSION 09/13/2007  . Diabetes mellitus    diet controlled  . DYSPNEA ON EXERTION 02/06/2010  . Eustachian tube dysfunction 05/20/2011  . FOOT PAIN, RIGHT  09/27/2008  . HYPERLIPIDEMIA 03/11/2008  . LBP (low back pain) 05/20/2011  . Leukocytosis 11/20/2011  . OSTEOARTHROSIS NOS, LOWER LEG 09/13/2007  . SHOULDER PAIN, LEFT 02/14/2009  . SVT (supraventricular tachycardia) ([place])   . SYMPTOM, PALPITATIONS 09/13/2007  . URINARY INCONTINENCE 08/23/2009  . Vertigo 05/20/2011    Patient Active Problem List   Diagnosis Date Noted  . Acute encephalopathy 03/13/2020  . Acute subdural hematoma ([place]) 02/29/2020  . Syncope 02/29/2020  . Mild renal insufficiency 02/29/2020  . Essential hypertension 02/29/2020  . Asthma 02/29/2020  . Chronic dyspnea 01/04/2020  . Allergic rhinitis 01/04/2020  . External otitis of right ear 07/25/2019  . Bilateral foot pain 07/01/2019  . Diabetic neuropathy ([place]) 03/04/2018  . Obstructive sleep apnea 04/04/2016  . Peripheral edema 12/23/2015  . Hypersomnolence 12/22/2015  . Baker's cyst of knee 11/24/2013  . Left knee pain 11/24/2013  . Dyspnea 11/24/2013  . Rash 05/26/2012  . Bilateral shoulder pain 12/13/2011  . Diabetes mellitus type II, non insulin dependent ([place]) 11/20/2011  . Leukocytosis 11/20/2011  . Preoperative clearance 11/20/2011  . Eustachian tube dysfunction 05/20/2011  . LBP (low back pain) 05/20/2011  . Encounter for well adult exam with abnormal findings 05/20/2011  . Vertigo 05/20/2011  . Hypothyroidism 02/15/2011  . Other dysphagia 02/15/2011  . URINARY INCONTINENCE 08/23/2009  . Seasonal and perennial allergic rhinitis 04/02/2009  . CONSTIPATION 09/27/2008  . Hyperlipidemia 03/11/2008  . BUNIONS, BILATERAL 12/23/2007  . Depression 09/13/2007  . OSTEOARTHROSIS NOS, LOWER LEG 09/13/2007  . COLONIC POLYPS, HX OF 09/13/2007  Past Surgical History:  Procedure Laterality Date  . BUNIONECTOMY     right  . ccx    . CHOLECYSTECTOMY    . LUMBAR LAMINECTOMY    . LUMBAR LAMINECTOMY    . SHOULDER ARTHROSCOPY  12/13/2011   Procedure: ARTHROSCOPY SHOULDER;  Surgeon: Augustin Schooling;  Location: [facility];  Service: Orthopedics;  Laterality: Left;  Left Shoulder ArthroscopyDebridement Limited Tenodesis Open Rotator Cuff Repair Spur Removal Right Shoulder Injection      OB History    Gravida  2   Para  2   Term      Preterm      AB      Living  2     SAB      TAB      Ectopic      Multiple      Live Births              Family History  Problem Relation Age of Onset  . Colon cancer Mother   . Heart disease Father   . Diabetes type II Father   . Heart disease Brother   . Diabetes Other        father  . Esophageal cancer Neg Hx   . Stomach cancer Neg Hx   . Rectal cancer Neg Hx     Social History   Tobacco Use  . Smoking status: Former Smoker    Packs/day: 1.00    Years: 10.00    Pack years: 10.00  . Smokeless tobacco: Never Used  . Tobacco comment: Smoked as a teenager. Quit in 1970  Substance Use Topics  . Alcohol use: No    Alcohol/week: 0.0 standard drinks  . Drug use: No    Home Medications Prior to Admission medications   Medication Sig Start Date End Date Taking? Authorizing Provider  acetaminophen (TYLENOL) 325 MG tablet Take 2 tablets (650 mg total) by mouth every 6 (six) hours as needed for mild pain or headache. 03/02/20  Yes Eliseo Squires, Jessica U, DO  albuterol (VENTOLIN HFA) 108 (90 Base) MCG/ACT inhaler Inhale 2 puffs into the lungs every 6 (six) hours as needed for wheezing or shortness of breath. 01/04/20  Yes Biagio Borg, MD  aspirin 81 MG tablet Take 1 tablet (81 mg total) by mouth daily. 03/08/20  Yes Vann, Jessica U, DO  atorvastatin (LIPITOR) 80 MG tablet Take 1 tablet (80 mg total) by mouth daily. 07/05/19  Yes Biagio Borg, MD  Blood Glucose Monitoring Suppl [facility] VERIO) w/Device KIT Use as directed twice daily E11.9 07/01/19  Yes Biagio Borg, MD  budesonide-formoterol [facility]) 160-4.5 MCG/ACT inhaler Inhale 2 puffs into the lungs 2 (two) times daily. 04/03/17  Yes Young, Tarri Fuller D, MD  Calcium Carbonate (CALCIUM 500 PO) Take  2,000 mg by mouth daily.   Yes [provider]  cetirizine (ZYRTEC) 10 MG tablet Take 1 tablet (10 mg total) by mouth daily. 01/04/20 01/03/21 Yes Biagio Borg, MD  diclofenac (VOLTAREN) 75 MG EC tablet Take 75 mg by mouth 2 (two) times daily.   Yes [provider]  empagliflozin (JARDIANCE) 25 MG TABS tablet Take 25 mg by mouth daily. 09/03/19  Yes Biagio Borg, MD  fluticasone (FLONASE) 50 MCG/ACT nasal spray SHAKE LIQUID AND USE 2 SPRAYS IN EACH NOSTRIL DAILY FOR ALLERGIES Patient taking differently: Place 2 sprays into both nostrils daily.  05/18/18  Yes Biagio Borg, MD  glipiZIDE (GLUCOTROL XL) 2.5 MG 24 hr  tablet Take 1 tablet (2.5 mg total) by mouth daily with breakfast. Patient taking differently: Take 2.5 mg by mouth at bedtime.  07/05/19  Yes Biagio Borg, MD  hydrochlorothiazide (HYDRODIURIL) 25 MG tablet TAKE 1/2 TABLET(12.5 MG) BY MOUTH DAILY Patient taking differently: Take 12.5 mg by mouth daily.  06/16/19  Yes Biagio Borg, MD  levETIRAcetam (KEPPRA) 500 MG tablet Take 1 tablet (500 mg total) by mouth 2 (two) times daily. 03/02/20  Yes Geradine Girt, DO  meclizine (ANTIVERT) 25 MG tablet Take 1 tablet (25 mg total) by mouth 3 (three) times daily as needed for dizziness. 03/04/18  Yes Biagio Borg, MD  metFORMIN (GLUCOPHAGE-XR) 500 MG 24 hr tablet TAKE 4 TABLETS BY MOUTH EVERY MORNING Patient taking differently: Take 2,000 mg by mouth every morning.  02/28/20  Yes Biagio Borg, MD  metoprolol succinate (TOPROL-XL) 25 MG 24 hr tablet TAKE 1 TABLET(25 MG) BY MOUTH DAILY Patient taking differently: Take 25 mg by mouth daily.  11/09/19  Yes Biagio Borg, MD  Multiple Vitamin (MULTIVITAMIN WITH MINERALS) TABS tablet Take 1 tablet by mouth daily. 03/02/20  Yes Vann, Jessica U, DO  MYRBETRIQ 50 MG TB24 tablet Take 50 mg by mouth at bedtime.  12/30/19  Yes [provider]  NP THYROID 30 MG tablet Take 30 mg by mouth daily. 02/08/20  Yes [provider]    ondansetron (ZOFRAN) 4 MG tablet Take 4 mg by mouth every 8 (eight) hours as needed for nausea or vomiting.   Yes [provider]  traZODone (DESYREL) 50 MG tablet TAKE 1 TABLET(50 MG) BY MOUTH AT BEDTIME Patient taking differently: Take 50 mg by mouth at bedtime.  12/02/19  Yes Young, Tarri Fuller D, MD  Vitamin D, Ergocalciferol, (DRISDOL) 1.25 MG (50000 UT) CAPS capsule Take 1 capsule (50,000 Units total) by mouth every 7 (seven) days. Patient not taking: Reported on 02/29/2020 07/05/19   Biagio Borg, MD    Allergies    Aripiprazole and Simvastatin  Review of Systems   Review of Systems  Unable to perform ROS: Mental status change    Physical Exam Updated Vital Signs BP (!) 133/53 (BP Location: Right Arm)   Pulse 76   Temp 98.4 F (36.9 C) (Oral)   Resp 20   Ht _0  (1.727 m)   Wt 110 kg   SpO2 100%   BMI 36.87 kg/m   Physical Exam Vitals and nursing note reviewed.  Constitutional:      General: She is not in acute distress.    Appearance: Normal appearance. She is obese. She is not ill-appearing.  HENT:     Head: Normocephalic.     Right Ear: External ear normal.     Left Ear: External ear normal.     Nose: Nose normal.     Mouth/Throat:     Mouth: Mucous membranes are moist.     Pharynx: Oropharynx is clear.  Eyes:     Extraocular Movements: Extraocular movements intact.  Cardiovascular:     Rate and Rhythm: Normal rate and regular rhythm.     Pulses: Normal pulses.     Heart sounds: Normal heart sounds.  Pulmonary:     Effort: Pulmonary effort is normal. No respiratory distress.     Breath sounds: Normal breath sounds. No wheezing or rhonchi.  Abdominal:     General: Bowel sounds are normal.     Palpations: Abdomen is soft.     Tenderness: There is  no abdominal tenderness. There is no guarding.  Musculoskeletal:     Cervical back: Normal range of motion.     Right lower leg: No edema.     Left lower leg: No edema.  Skin:    General: Skin is warm  and dry.     Capillary Refill: Capillary refill takes less than 2 seconds.  Neurological:     General: No focal deficit present.     Mental Status: She is alert. She is confused.     GCS: GCS eye subscore is 4. GCS verbal subscore is 2. GCS motor subscore is 5.     Cranial Nerves: Dysarthria present.     Motor: No weakness.  Psychiatric:        Speech: Speech is slurred.     ED Results / Procedures / Treatments   Labs (all labs ordered are listed, but only abnormal results are displayed) Labs Reviewed  DIFFERENTIAL - Abnormal; Notable for the following components:      Result Value   Monocytes Absolute 1.1 (*)    All other components within normal limits  COMPREHENSIVE METABOLIC PANEL - Abnormal; Notable for the following components:   CO2 16 (*)    BUN 24 (*)    Creatinine, Ser 1.07 (*)    GFR calc non Af Amer 51 (*)    GFR calc Af Amer 59 (*)    Anion gap 18 (*)    All other components within normal limits  HEMOGLOBIN A1C - Abnormal; Notable for the following components:   Hgb A1c MFr Bld 5.9 (*)    All other components within normal limits  I-STAT CHEM 8, ED - Abnormal; Notable for the following components:   BUN 24 (*)    TCO2 20 (*)    All other components within normal limits  SARS CORONAVIRUS 2 (TAT 6-24 HRS)  PROTIME-INR  APTT  CBC  GLUCOSE, CAPILLARY  TSH  T4, FREE  HEMOGLOBIN A1C  LIPID PANEL  URINALYSIS, COMPLETE (UACMP) WITH MICROSCOPIC  CBG MONITORING, ED    EKG EKG Interpretation  Date/Time:  Monday March 13 2020 14:40:07 EDT Ventricular Rate:  88 PR Interval:  124 QRS Duration: 84 QT Interval:  394 QTC Calculation: 476 R Axis:   40 Text Interpretation: Normal sinus rhythm Normal ECG Confirmed by Lajean Saver [id number]) on 03/13/2020 4:04:46 PM   Radiology CT HEAD WO CONTRAST  Result Date: 03/13/2020 CLINICAL DATA:  Possible stroke. Aphasia with increased respiratory rate. Fall yesterday. EXAM: CT HEAD WITHOUT CONTRAST CT CERVICAL SPINE  WITHOUT CONTRAST TECHNIQUE: Multidetector CT imaging of the head and cervical spine was performed following the standard protocol without intravenous contrast. Multiplanar CT image reconstructions of the cervical spine were also generated. COMPARISON:  Head CT 03/01/2020 and cervical spine CT 02/29/2020 FINDINGS: CT HEAD FINDINGS Brain: There is a small residual subdural hematoma along the left aspect of the falx which has decreased in size and density compared to the prior study and now measures up to 9 mm in thickness (previously 10 mm). There is minimal residual subdural blood layering along the left tentorium. No new intracranial hemorrhage, acute infarct, mass, or midline shift is identified. Mild cerebral atrophy is within normal limits for age. Hypodensities in the cerebral white matter bilaterally are nonspecific but compatible with mild chronic small vessel ischemic disease. Vascular: Calcified atherosclerosis at the skull base. No hyperdense vessel. Skull: No fracture or suspicious osseous lesion. Sinuses/Orbits: Paranasal sinuses and mastoid air cells are clear. Bilateral cataract extraction.  Other: None. CT CERVICAL SPINE FINDINGS Alignment: Cervical spine straightening. Trace anterolisthesis of C7 on T1, likely degenerative and facet mediated. Skull base and vertebrae: No acute fracture or suspicious osseous lesion. Soft tissues and spinal canal: No prevertebral fluid or swelling. No visible canal hematoma. Disc levels: Unchanged disc degeneration including advanced disc space narrowing, degenerative endplate irregularity, and sclerosis at C5-6 and C6-7. Severe right-sided facet arthrosis at C2-3 and C4-5. Moderate to severe neural foraminal stenosis on the left at C3-4 and C5-6 due to uncovertebral spurring. Upper chest: Clear lung apices. Other: Calcified plaque at the left carotid bifurcation. IMPRESSION: 1. No evidence of acute intracranial abnormality. 2. Decreased size of left parafalcine subdural  hematoma. 3. No acute cervical spine fracture. Electronically Signed   By: Logan Bores M.D.   On: 03/13/2020 16:39   CT Cervical Spine Wo Contrast  Result Date: 03/13/2020 CLINICAL DATA:  Possible stroke. Aphasia with increased respiratory rate. Fall yesterday. EXAM: CT HEAD WITHOUT CONTRAST CT CERVICAL SPINE WITHOUT CONTRAST TECHNIQUE: Multidetector CT imaging of the head and cervical spine was performed following the standard protocol without intravenous contrast. Multiplanar CT image reconstructions of the cervical spine were also generated. COMPARISON:  Head CT 03/01/2020 and cervical spine CT 02/29/2020 FINDINGS: CT HEAD FINDINGS Brain: There is a small residual subdural hematoma along the left aspect of the falx which has decreased in size and density compared to the prior study and now measures up to 9 mm in thickness (previously 10 mm). There is minimal residual subdural blood layering along the left tentorium. No new intracranial hemorrhage, acute infarct, mass, or midline shift is identified. Mild cerebral atrophy is within normal limits for age. Hypodensities in the cerebral white matter bilaterally are nonspecific but compatible with mild chronic small vessel ischemic disease. Vascular: Calcified atherosclerosis at the skull base. No hyperdense vessel. Skull: No fracture or suspicious osseous lesion. Sinuses/Orbits: Paranasal sinuses and mastoid air cells are clear. Bilateral cataract extraction. Other: None. CT CERVICAL SPINE FINDINGS Alignment: Cervical spine straightening. Trace anterolisthesis of C7 on T1, likely degenerative and facet mediated. Skull base and vertebrae: No acute fracture or suspicious osseous lesion. Soft tissues and spinal canal: No prevertebral fluid or swelling. No visible canal hematoma. Disc levels: Unchanged disc degeneration including advanced disc space narrowing, degenerative endplate irregularity, and sclerosis at C5-6 and C6-7. Severe right-sided facet arthrosis at  C2-3 and C4-5. Moderate to severe neural foraminal stenosis on the left at C3-4 and C5-6 due to uncovertebral spurring. Upper chest: Clear lung apices. Other: Calcified plaque at the left carotid bifurcation. IMPRESSION: 1. No evidence of acute intracranial abnormality. 2. Decreased size of left parafalcine subdural hematoma. 3. No acute cervical spine fracture. Electronically Signed   By: Logan Bores M.D.   On: 03/13/2020 16:39   DG Chest Portable 1 View  Result Date: 03/13/2020 CLINICAL DATA:  Altered mental status.  Diabetes.  Hyperlipidemia. EXAM: PORTABLE CHEST 1 VIEW COMPARISON:  02/29/2020 FINDINGS: Midline trachea. Borderline cardiomegaly. Atherosclerosis in the transverse aorta. No pleural effusion or pneumothorax. No congestive failure. Low lung volumes with resultant pulmonary interstitial prominence. No lobar consolidation. IMPRESSION: Low lung volumes, without acute disease. Aortic Atherosclerosis (ICD10-I70.0). Electronically Signed   By: Abigail Miyamoto M.D.   On: 03/13/2020 18:25    Procedures Procedures (including critical care time)  Medications Ordered in ED Medications  atorvastatin (LIPITOR) tablet 80 mg (has no administration in time range)  thyroid (ARMOUR) tablet 30 mg (has no administration in time range)  mirabegron ER (MYRBETRIQ)  tablet 50 mg (50 mg Oral Not Given 03/13/20 2301)  levETIRAcetam (KEPPRA) tablet 500 mg (500 mg Oral Not Given 03/13/20 2301)  multivitamin with minerals tablet 1 tablet (has no administration in time range)  albuterol (PROVENTIL) (2.5 MG/3ML) 0.083% nebulizer solution 3 mL (has no administration in time range)  mometasone-formoterol (DULERA) 200-5 MCG/ACT inhaler 2 puff (2 puffs Inhalation Not Given 03/13/20 1941)  loratadine (CLARITIN) tablet 10 mg (has no administration in time range)  fluticasone (FLONASE) 50 MCG/ACT nasal spray 2 spray (has no administration in time range)  0.9 %  sodium chloride infusion ( Intravenous New Bag/Given 03/13/20 1856)    acetaminophen (TYLENOL) tablet 650 mg (has no administration in time range)    Or  acetaminophen (TYLENOL) 160 MG/5ML solution 650 mg (has no administration in time range)    Or  acetaminophen (TYLENOL) suppository 650 mg (has no administration in time range)  senna-docusate (Senokot-S) tablet 1 tablet (has no administration in time range)  insulin aspart (novoLOG) injection 0-9 Units (0 Units Subcutaneous Not Given 03/13/20 2114)  hydrOXYzine (ATARAX/VISTARIL) tablet 25 mg (has no administration in time range)  sodium chloride flush (NS) 0.9 % injection 3 mL (3 mLs Intravenous Given 03/13/20 1859)   stroke: mapping our early stages of recovery book ( Does not apply Given 03/13/20 1902)    ED Course  I have reviewed the triage vital signs and the nursing notes.  Pertinent labs & imaging results that were available during my care of the patient were reviewed by me and considered in my medical decision making (see chart for details).    MDM Rules/Calculators/A&P                      74 year old female with a past medical history of type 2 diabetes, hypertension, asthma, hypothyroidism, obstructive sleep apnea with CPAP intolerance, recent fall complicated by a subdural hemorrhage presenting to the emergency department with concern for increased altered mental status and tachypnea.   Differential diagnoses considered include acute traumatic intracranial hemorrhage, calvarial fracture, cervical trauma, less likely CVA given nonfocal neuro exam, decompensated dementia versus delirium in the setting of possible toxic metabolic derangement  ECG interpreted by me demonstrated  NSR without ST or T wave changes suggestive of acute ischemia, similar compared to previous on 03/03/2020  CXR interpreted by me demonstrated low lung volumes otherwise no acute cardiopulmonary processes  Labs demonstrated diminished bicarb to 16, BUN elevated to 24 with a creatinine of 1.07, AG 18, no significant arrangements  on CBC, normal coags, unremarkable urinalysis  CT head without evidence of acute intracranial abnormality and decreased size of subdural hematoma.  CT C-spine negative for any acute fractures or malalignments of the cervical spine.  Patient admitted for acute encephalopathy of unclear origin and further evaluation for possible remote stroke  Care of this patient was discussed with Dr. Ashok Cordia, who directly supervised the care of this patient.  Final Clinical Impression(s) / ED Diagnoses Final diagnoses:  AMS (altered mental status)    Rx / DC Orders ED Discharge Orders    None       Filbert Berthold, MD 03/13/20 2325    Lajean Saver, MD 03/14/20 1345

## 2020-03-13 NOTE — Progress Notes (Signed)
Patient came down to MRI and was shaking all over and breathing very heavy and acting like she could not catch her breath. Put her on some Oxygen but that did not seem like it helped. She continued to shake and breathe very heavy. RN stated she was not on oxygen upstairs and was stable before coming down. Did not feel comfortable scanning patient in the state she was in without any nurses to watch her. Rn made aware.

## 2020-03-14 ENCOUNTER — Observation Stay (HOSPITAL_COMMUNITY): Payer: Medicare Other

## 2020-03-14 DIAGNOSIS — S065X9A Traumatic subdural hemorrhage with loss of consciousness of unspecified duration, initial encounter: Secondary | ICD-10-CM | POA: Diagnosis not present

## 2020-03-14 DIAGNOSIS — R42 Dizziness and giddiness: Secondary | ICD-10-CM | POA: Diagnosis not present

## 2020-03-14 DIAGNOSIS — E039 Hypothyroidism, unspecified: Secondary | ICD-10-CM | POA: Diagnosis not present

## 2020-03-14 DIAGNOSIS — I6200 Nontraumatic subdural hemorrhage, unspecified: Secondary | ICD-10-CM | POA: Diagnosis not present

## 2020-03-14 DIAGNOSIS — G9340 Encephalopathy, unspecified: Secondary | ICD-10-CM | POA: Diagnosis not present

## 2020-03-14 DIAGNOSIS — E119 Type 2 diabetes mellitus without complications: Secondary | ICD-10-CM | POA: Diagnosis not present

## 2020-03-14 LAB — GLUCOSE, CAPILLARY
Glucose-Capillary: 105 mg/dL — ABNORMAL HIGH (ref 70–99)
Glucose-Capillary: 105 mg/dL — ABNORMAL HIGH (ref 70–99)
Glucose-Capillary: 107 mg/dL — ABNORMAL HIGH (ref 70–99)
Glucose-Capillary: 115 mg/dL — ABNORMAL HIGH (ref 70–99)
Glucose-Capillary: 133 mg/dL — ABNORMAL HIGH (ref 70–99)
Glucose-Capillary: 149 mg/dL — ABNORMAL HIGH (ref 70–99)
Glucose-Capillary: 92 mg/dL (ref 70–99)

## 2020-03-14 LAB — URINALYSIS, ROUTINE W REFLEX MICROSCOPIC
Bilirubin Urine: NEGATIVE
Glucose, UA: >=500 mg/dL — AB
Hgb urine dipstick: NEGATIVE
Ketones, ur: NEGATIVE mg/dL
Nitrite: NEGATIVE
Protein, ur: NEGATIVE mg/dL
Specific Gravity, Urine: 1.024 (ref 1.005–1.030)
pH: 5 (ref 5.0–8.0)

## 2020-03-14 LAB — LIPID PANEL
Cholesterol: 129 mg/dL (ref 0–200)
HDL: 31 mg/dL — ABNORMAL LOW (ref >40)
LDL Cholesterol: 61 mg/dL (ref 0–99)
Total CHOL/HDL Ratio: 4.2 RATIO
Triglycerides: 185 mg/dL — ABNORMAL HIGH (ref <150)
VLDL: 37 mg/dL (ref 0–40)

## 2020-03-14 LAB — HEMOGLOBIN A1C
Hgb A1c MFr Bld: 5.9 % — ABNORMAL HIGH (ref 4.8–5.6)
Mean Plasma Glucose: 122.63 mg/dL

## 2020-03-14 LAB — RAPID URINE DRUG SCREEN, HOSP PERFORMED
Amphetamines: NOT DETECTED
Barbiturates: NOT DETECTED
Benzodiazepines: NOT DETECTED
Cocaine: NOT DETECTED
Opiates: NOT DETECTED
Tetrahydrocannabinol: NOT DETECTED

## 2020-03-14 IMAGING — MR MR HEAD W/O CM
10 of 11 series · 42 of 48 positions shown · non-contrast
Comparison: CT head without contrast [DATE], [DATE], and
[DATE].

CLINICAL DATA: Subdural hematoma 2 weeks ago with recent change in
mental status.

EXAM:
MRI HEAD WITHOUT CONTRAST
TECHNIQUE: Multiplanar, multiecho pulse sequences of the brain and surrounding
structures were obtained without intravenous contrast.

[Series 5: DWI · axial · 3.0mm · 0.88mm/px · z∈[-27,+128]mm · 6 of 53 slices shown (1 of 4)]
[im 1/53]
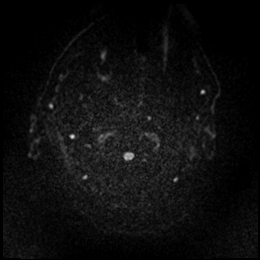
[im 11/53]
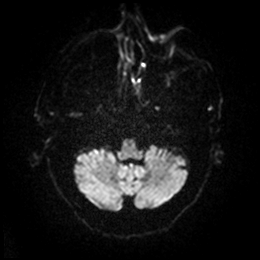
[im 21/53]
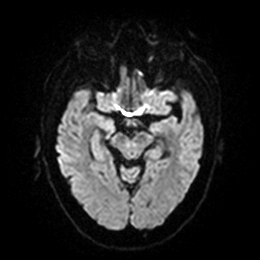
[im 32/53]
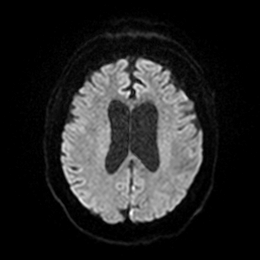
[im 42/53]
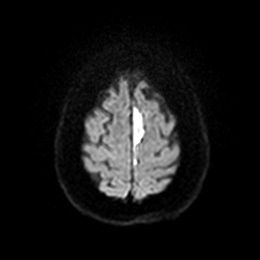
[im 53/53]
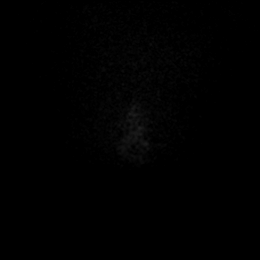

[Series 6: DWI · axial · 3.0mm · 0.88mm/px · z∈[-27,+128]mm · 6 of 53 slices shown (2 of 4)]
[im 1/53]
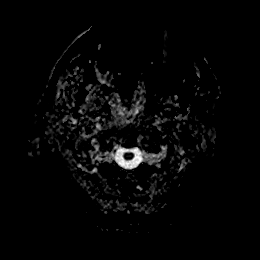
[im 11/53]
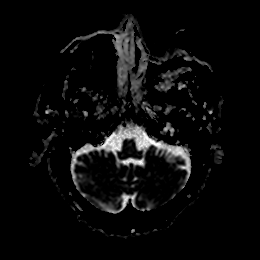
[im 21/53]
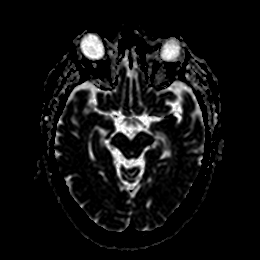
[im 32/53]
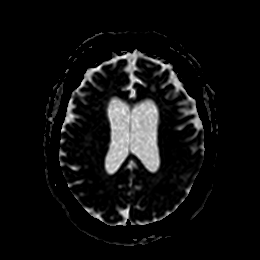
[im 42/53]
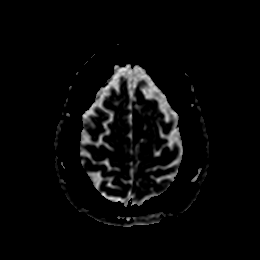
[im 53/53]
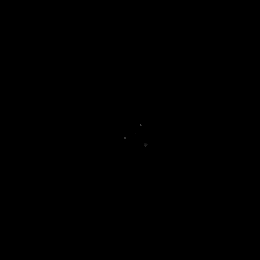

[Series 7: DWI · coronal · 4.0mm · 0.88mm/px · 4 of 39 slices shown (3 of 4)]
[im 1/39]
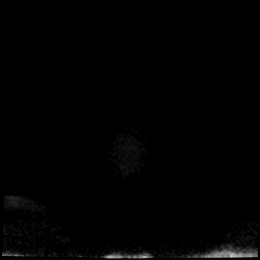
[im 13/39]
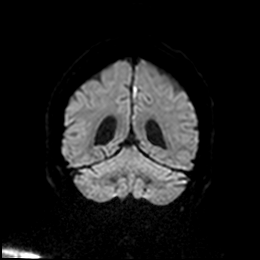
[im 26/39]
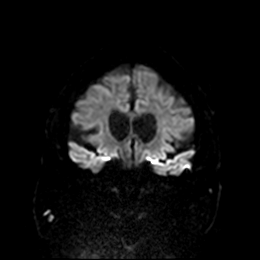
[im 39/39]
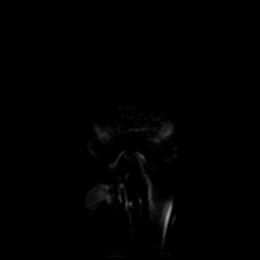

[Series 8: DWI · coronal · 4.0mm · 0.88mm/px · 4 of 39 slices shown (4 of 4)]
[im 1/39]
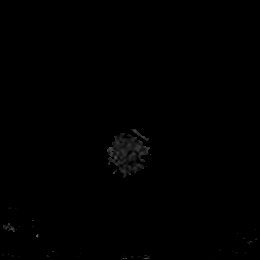
[im 13/39]
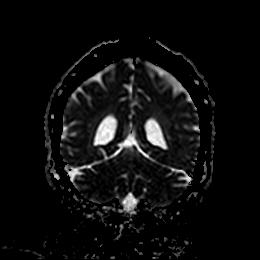
[im 26/39]
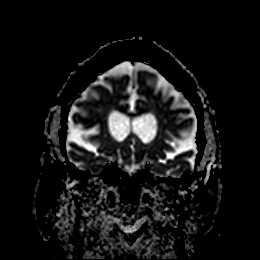
[im 39/39]
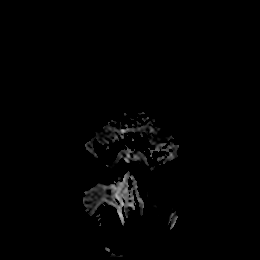

[Series 9: T1 · sagittal · 5.0mm · 0.75mm/px · 3 of 25 slices shown]
[im 1/25]
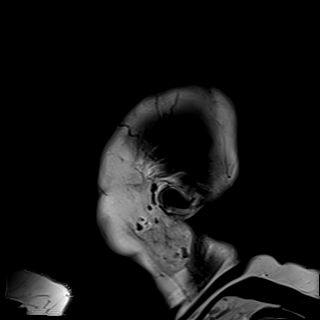
[im 13/25]
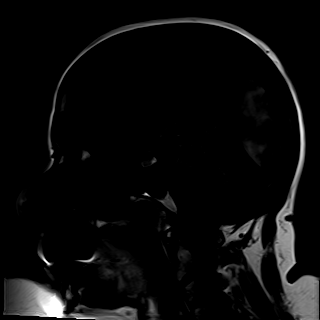
[im 25/25]
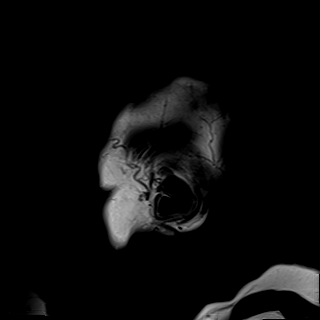

[Series 10: T2 · axial · 5.0mm · 0.72mm/px · z∈[-20,+123]mm · 3 of 25 slices shown (1 of 2)]
[im 1/25]
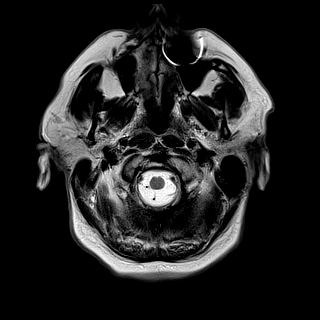
[im 13/25]
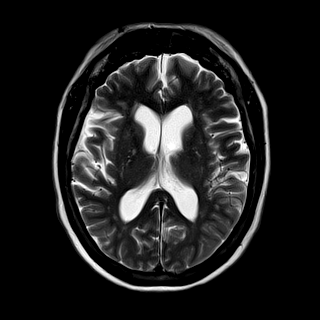
[im 25/25]
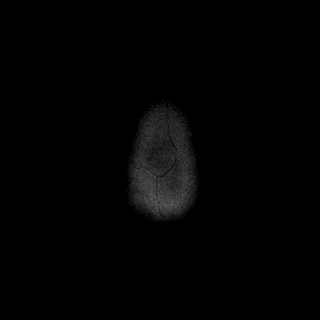

[Series 11: FLAIR · axial · 5.0mm · 0.45mm/px · z∈[-19,+125]mm · 3 of 25 slices shown]
[im 1/25]
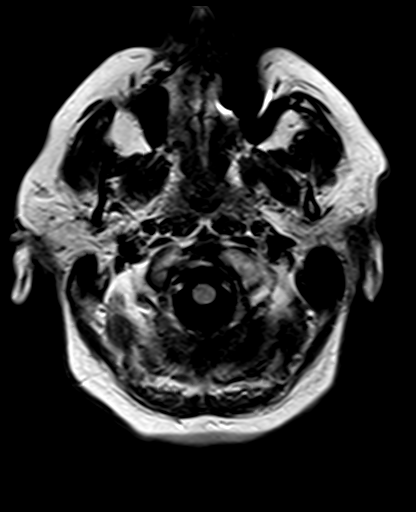
[im 13/25]
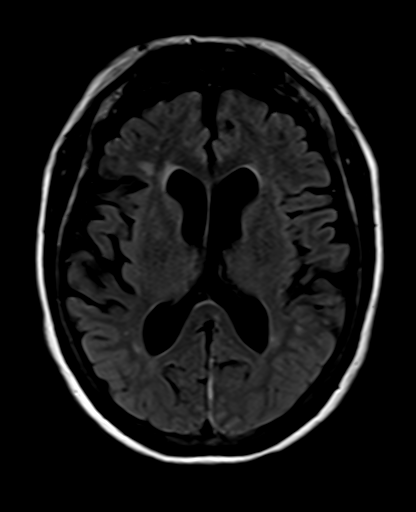
[im 25/25]
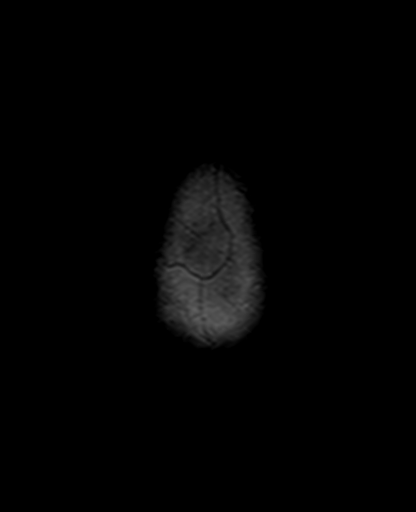

[Series 17: T2 · coronal · 5.0mm · 0.34mm/px · 3 of 31 slices shown (2 of 2)]
[im 1/31]
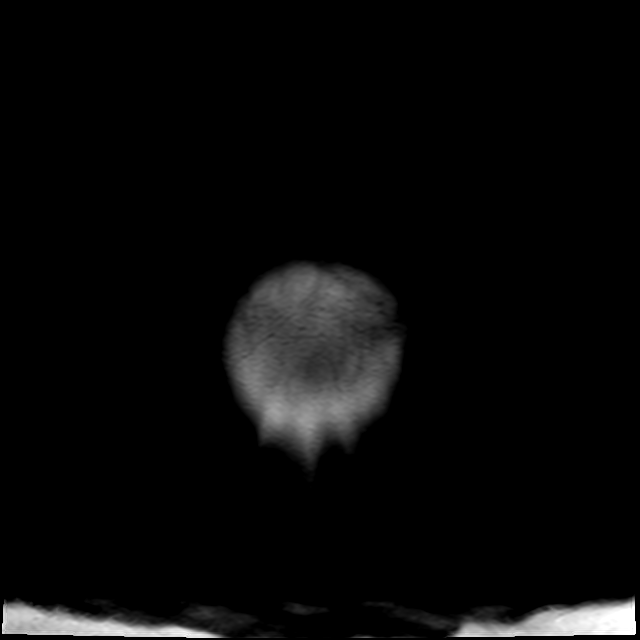
[im 16/31]
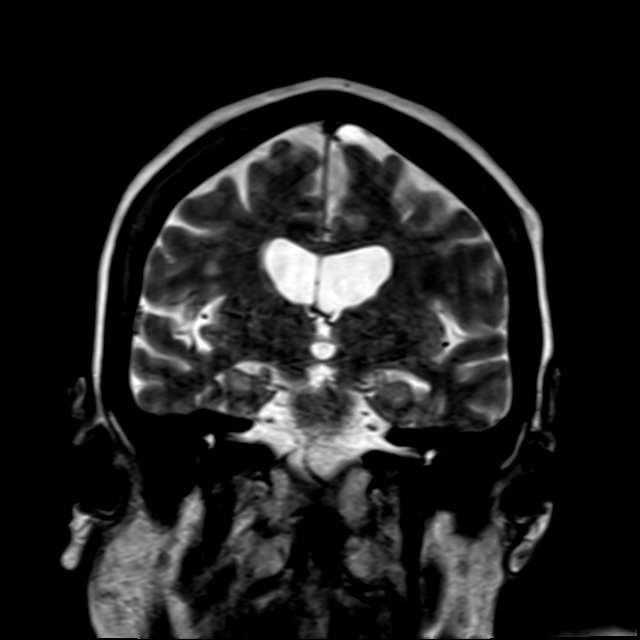
[im 31/31]
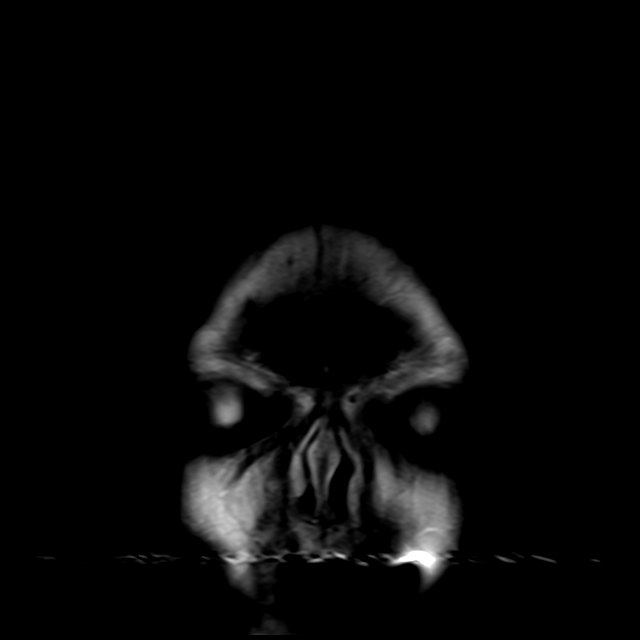

[Series 19: pha_images · axial · 4.0mm · 0.90mm/px · z∈[-26,+129]mm · 5 of 40 slices shown]
[im 1/40]
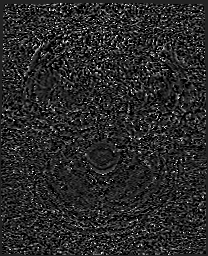
[im 10/40]
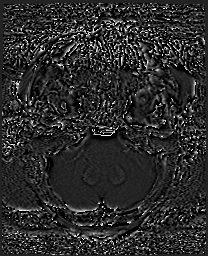
[im 20/40]
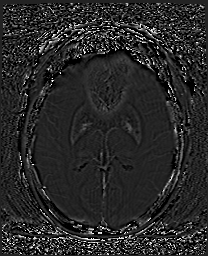
[im 30/40]
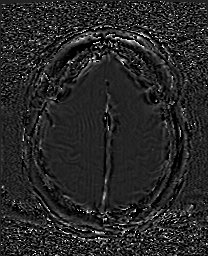
[im 40/40]
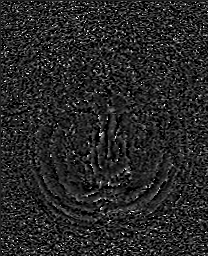

[Series 20: swi_images · axial · 4.0mm · 0.90mm/px · z∈[-26,+129]mm · 5 of 40 slices shown]
[im 1/40]
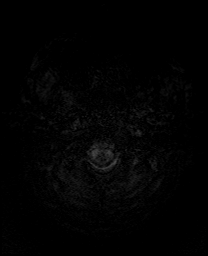
[im 10/40]
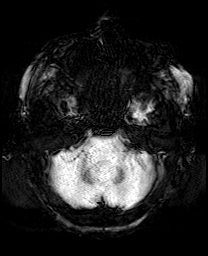
[im 20/40]
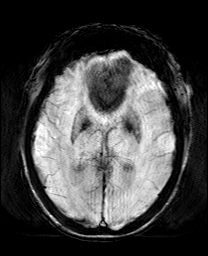
[im 30/40]
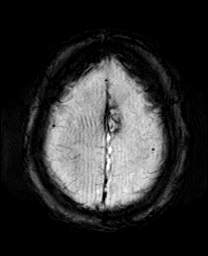
[im 40/40]
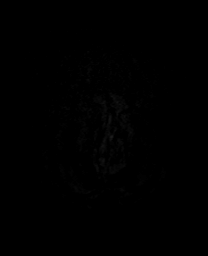

[42 of 48 positions shown; findings below may reference images not displayed]

FINDINGS: Brain: The left para falcine subacute subdural hematoma is again
noted. Remote lacunar infarcts are present within the internal
capsule bilaterally. No acute infarct is present. Scattered
subcortical T2 hyperintensities bilaterally are mildly advanced for
age. The ventricles are of normal size. No significant extraaxial
fluid collection is present.

The internal auditory canals are within normal limits. The brainstem
and cerebellum are within normal limits.

Vascular: Flow is present in the major intracranial arteries.

Skull and upper cervical spine: The craniocervical junction is
normal. Upper cervical spine is within normal limits. Marrow signal
is unremarkable.

Sinuses/Orbits: The paranasal sinuses and mastoid air cells are
clear. Bilateral lens replacements are noted. Globes and orbits are
otherwise unremarkable.
IMPRESSION: 1. Stable left para falcine subacute subdural hematoma.
2. No acute intracranial abnormality.
3. Scattered subcortical T2 hyperintensities bilaterally are mildly
advanced for age. The finding is nonspecific but can be seen in the
setting of chronic microvascular ischemia, a demyelinating process
such as multiple sclerosis, vasculitis, complicated migraine
headaches, or as the sequelae of a prior infectious or inflammatory
process.

## 2020-03-14 NOTE — Evaluation (Signed)
Physical Therapy Evaluation Patient Details Name: Alexandra Wolfe MRN: [id number] DOB: 1946-09-09 Today's Date: 03/14/2020   History of Present Illness  74 yo female admitted to ED on 4/4 with AMS, speech changes, fall on 4/3. CT head negative for acute findings, C-spine cleared, and MRI with no acute findings. Pt with recent admission 3/23-3/25 for fall, SDH which is now smaller per CT. PMH includes vertigo, LBP, HLD, DOE, depression, anxiety.  Clinical Impression   Pt presents with impaired cognition, impulsivity with mobility, difficulty performing mobility tasks, unsteadiness in standing with history of recent falls, and decreased activity tolerance. Pt to benefit from acute PT to address deficits. Pt ambulated room distance x2, and overall required min guard to mod assist for mobility on evaluation. Pt reports she does not have 24/7 assist at home, and daily living has been difficult due to decreased support. PT recommending SNF level of care post-acutely to address mobility deficits and maximize pt independence. PT to progress mobility as tolerated, and will continue to follow acutely.      Follow Up Recommendations SNF;Supervision for mobility/OOB    Equipment Recommendations  Other (comment)(defer to next venue)    Recommendations for Other Services       Precautions / Restrictions Precautions Precautions: Fall Restrictions Weight Bearing Restrictions: No      Mobility  Bed Mobility Overal bed mobility: Needs Assistance Bed Mobility: Rolling;Sidelying to Sit Rolling: Min guard Sidelying to sit: Mod assist       General bed mobility comments: mod assist for supine to sit for trunk elevation, scooting to EOB with use of bed pads.  Transfers Overall transfer level: Needs assistance Equipment used: Rolling walker (2 wheeled) Transfers: Sit to/from Stand Sit to Stand: Min assist;From elevated surface         General transfer comment: Min assist for power up,  steadying pt and RW, increased time to rise to full standing. Verbal cuing for hand placement when rising, sit to stand x3 from EOB, toilet, and recliner.  Ambulation/Gait Ambulation/Gait assistance: Min guard Gait Distance (Feet): 40 Feet(2x20 in room) Assistive device: Rolling walker (2 wheeled) Gait Pattern/deviations: Step-through pattern;Decreased stride length;Trunk flexed;Drifts right/left Gait velocity: decr   General Gait Details: min guard for safety, verbal cuing for placement in RW, upright posture, standing rest breaks as needed to recover DOE 3/4. Pt recovered dyspnea within seconds with rest.  Stairs            Wheelchair Mobility    Modified Rankin (Stroke Patients Only)       Balance Overall balance assessment: Needs assistance Sitting-balance support: Feet supported;No upper extremity supported Sitting balance-Leahy Scale: Fair     Standing balance support: Bilateral upper extremity supported Standing balance-Leahy Scale: Poor Standing balance comment: needs external support/assist                             Pertinent Vitals/Pain Pain Assessment: Faces Faces Pain Scale: Hurts little more Pain Location: R knee, during mobility Pain Descriptors / Indicators: Sore;Discomfort Pain Intervention(s): Limited activity within patient's tolerance;Monitored during session;Repositioned    Home Living Family/patient expects to be discharged to:: Private residence Living Arrangements: Children(staying with son) Available Help at Discharge: Family;Available PRN/intermittently Type of Home: House Home Access: Stairs to enter   Entrance Stairs-Number of Steps: 2 Home Layout: One level Home Equipment: Walker - 2 wheels;Bedside commode;Shower seat      Prior Function Level of Independence: Independent with assistive device(s);Needs assistance  Gait / Transfers Assistance Needed: RW to walk around home at all times  ADL's / Homemaking Assistance  Needed: receiving assist for dressing self  Comments: per pt report and chart review; unsure of reliability of pt's subjective information and no family present     Hand Dominance   Dominant Hand: Right    Extremity/Trunk Assessment   Upper Extremity Assessment Upper Extremity Assessment: Defer to OT evaluation    Lower Extremity Assessment Lower Extremity Assessment: Generalized weakness;RLE deficits/detail RLE Deficits / Details: scab on R kneecap, empty end feel knee flexion due to R knee pain RLE: Unable to fully assess due to pain    Cervical / Trunk Assessment Cervical / Trunk Assessment: Normal  Communication   Communication: Expressive difficulties(stuttering speech, slowed)  Cognition Arousal/Alertness: Awake/alert Behavior During Therapy: WFL for tasks assessed/performed Overall Cognitive Status: Impaired/Different from baseline Area of Impairment: Orientation;Attention;Memory;Following commands;Safety/judgement;Awareness;Problem solving                 Orientation Level: Disoriented to;Place;Situation Current Attention Level: Sustained Memory: Decreased short-term memory Following Commands: Follows one step commands inconsistently Safety/Judgement: Decreased awareness of deficits;Decreased awareness of safety Awareness: Intellectual Problem Solving: Slow processing;Decreased initiation;Difficulty sequencing;Requires verbal cues;Requires tactile cues General Comments: Pt states it is 1921, she is at [facility], and the month is January. Pt reports she lives with her mother, and when asked how old her mother is pt states her own age. Pt then answers "yes" to "do you live with your son?". Pt with impulsivity present this session, requires frequent cues to wait for PT assist pror to mobilizing.      General Comments General comments (skin integrity, edema, etc.): no reports of dizziness with change in positions, during mobility    Exercises     Assessment/Plan     PT Assessment Patient needs continued PT services  PT Problem List Decreased strength;Decreased mobility;Decreased safety awareness;Decreased range of motion;Decreased activity tolerance;Decreased balance;Decreased knowledge of use of DME;Pain;Decreased cognition;Obesity       PT Treatment Interventions Gait training;DME instruction;Functional mobility training;Therapeutic activities;Therapeutic exercise;Balance training;Neuromuscular re-education;Patient/family education    PT Goals (Current goals can be found in the Care Plan section)  Acute Rehab PT Goals Patient Stated Goal: none stated PT Goal Formulation: With patient Time For Goal Achievement: 03/28/20 Potential to Achieve Goals: Good    Frequency Min 2X/week   Barriers to discharge        Co-evaluation               AM-PAC PT "6 Clicks" Mobility  Outcome Measure Help needed turning from your back to your side while in a flat bed without using bedrails?: A Little Help needed moving from lying on your back to sitting on the side of a flat bed without using bedrails?: A Lot Help needed moving to and from a bed to a chair (including a wheelchair)?: A Lot Help needed standing up from a chair using your arms (e.g., wheelchair or bedside chair)?: A Little Help needed to walk in hospital room?: A Little Help needed climbing 3-5 steps with a railing? : A Lot 6 Click Score: 15    End of Session Equipment Utilized During Treatment: Gait belt Activity Tolerance: Patient limited by fatigue Patient left: with family/visitor present;in chair;with chair alarm set;with call bell/phone within reach Nurse Communication: Mobility status PT Visit Diagnosis: Difficulty in walking, not elsewhere classified (R26.2);Unsteadiness on feet (R26.81);History of falling (Z91.81)    Time: 1430-1455 PT Time Calculation (min) (ACUTE ONLY): 25 min  Charges:   PT Evaluation $PT Eval Low Complexity: 1 Low PT Treatments $Gait Training: 8-22  mins        Ily Denno E, PT Acute Rehabilitation Services Pager [phone]  Office [phone]  Roxine Caddy D Elonda Husky 03/14/2020, 4:34 PM

## 2020-03-14 NOTE — Progress Notes (Signed)
EEG complete - results pending 

## 2020-03-14 NOTE — Progress Notes (Addendum)
  Speech Language Pathology Treatment: Dysphagia  Patient Details Name: Alexandra Wolfe MRN: [id number] DOB: 1946-06-19 Today's Date: 03/14/2020 Time: 1001-1020 SLP Time Calculation (min) (ACUTE ONLY): 19 min  Assessment / Plan / Recommendation Clinical Impression  Family arrived at initiation of SLP session - and inquired re: pt's status.  Reviewed with pt's son and his significant other *RN at [employer]* regarding pt's swallow evaluation finding, pt's report of premorbid choking with liquids daily and purpose of current recommendations.  Advised family and pt that 3 ounce Yale was not passed with SLP due pt ceasing intake and recommendation for chin tuck posture in hopes to prevent "choking" with liquids.    Pt consumed her breakfast including 1/2 bagel, 1/2 scrambled eggs and portion of coffee, water.  She is observed to consume po at rapid rate and chases solids with liquids.  Uncertain if rapid rate of po due to hunger or behavioral?  Suspect this habit has resulted in pt's coughing with thin prior to admission.    Advised she order applesauce, mashed potatoes, etc for oral transiting of masticated solids due to increased difficulty manipulating solid and liquid simultaneously.  Family and pt reported understanding.   No indication of aspiration with po observed but pt is at increased risk due to her impulsivity. SLP changed pt to full supervision with po to maximize airway protection. Placed order for full supervision with po.    Using teach back, educated pt to increased pna risk if she is aspirating.  Of note, family reports pt's appearance of dyspnea is chronic for "years".  Son questions if it could be anxiety.    Son also reports pt's speech was fully fluent prior to admission and she has not memory deficits.  She was receiving speech services via [facility].  Son advises concern for pt as he is unable to provide 24/7 assist due to his work schedule. Advised him pt will also see PT/OT to help in dc  planning.  Will follow up.      HPI HPI: pt is a 74 yo female adm to [facility] ([facility]) with slurred speech, aphasia and fall day prior to admit .  PMH + for SDH, anxiety, DM, prior smoker.  Pt CXR negative x low lung volumes.  Pt imaging study showed decrease in size of SDH.  She did not tolerate an MRI last night due to bradycardia and c/o increased work of breathing.       SLP Plan  Continue with current plan of care       Recommendations  Diet recommendations: Regular;Thin liquid Liquids provided via: Straw Medication Administration: Whole meds with puree Supervision: Full supervision/cueing for compensatory strategies Compensations: Slow rate;Small sips/bites;Chin tuck;Use straw to facilitate chin tuck Postural Changes and/or Swallow Maneuvers: Seated upright 90 degrees;Upright 30-60 min after meal;Chin tuck(chin tuck with liquids)                Oral Care Recommendations: Oral care BID Follow up Recommendations: Skilled Nursing facility SLP Visit Diagnosis: Dysphagia, unspecified (R13.10) Plan: Continue with current plan of care       [place], Nazli Penn Ann 03/14/2020, 11:10 AM  Kathleen Lime, MS [place] Office [phone]

## 2020-03-14 NOTE — Progress Notes (Signed)
NEUROLOGY PROGRESS NOTE Subjective: Patient states that she is feeling better at this point time.  Exam: Vitals:   03/14/20 1146 03/14/20 1309  BP: (!) 139/52 (!) 111/54  Pulse: 90 78  Resp: 20 20  Temp: 98.1 F (36.7 C) 97.7 F (36.5 C)  SpO2: 97% 95%   Physical Exam  Constitutional: Appears well-developed and well-nourished.  Eyes: No scleral injection HENT: No OP obstrucion Head: Normocephalic.  Cardiovascular: Normal rate and regular rhythm.  Respiratory: Effort normal, non-labored breathing GI: Soft.  No distension. There is no tenderness.  Skin: WDI   Neuro:  Mental Status: Alert, believes she is at [facility].  Michela Pitcher the year was 2020 and the month was March.  Was able to follow all instructions.  Speech is fluent without evidence of aphasia. Cranial Nerves: II:  Visual fields grossly normal,  III,IV, VI: ptosis not present, extra-ocular motions intact bilaterally pupils equal, round, reactive to light and accommodation V,VII: smile symmetric, facial light touch sensation normal bilaterally VIII: hearing normal bilaterally IX,X: Palate rises midline XI: bilateral shoulder shrug XII: midline tongue extension Motor: Right : Upper extremity   5/5    Left:     Upper extremity   5/5  Gives no effort to lift legs.  Positive Hoover's Sensory: Withdraws from pain in all 4 extremities Deep Tendon Reflexes: 2+ and symmetric throughout Plantars: Right: downgoing   Left: downgoing Cerebellar: normal finger-to-nose, normal rapid alternating movements and normal heel-to-shin test Gait: normal gait and station    Medications:  Scheduled: . atorvastatin  80 mg Oral Daily  . fluticasone  2 spray Each Nare Daily  . insulin aspart  0-9 Units Subcutaneous Q4H  . levETIRAcetam  500 mg Oral BID  . loratadine  10 mg Oral Daily  . mirabegron ER  50 mg Oral QHS  . mometasone-formoterol  2 puff Inhalation BID  . multivitamin with minerals  1 tablet Oral Daily  . thyroid  30 mg  Oral Daily   Continuous: . sodium chloride 50 mL/hr at 03/13/20 1856    Pertinent Labs/Diagnostics: EEG IMPRESSION: This study is suggestive of cortical dysfunction in left frontal region as well as mild diffuse encephalopathy, nonspecific to etiology.  No seizures or epileptiform discharges were seen throughout the recording.   MR BRAIN WO CONTRAST  Result Date: 03/14/2020 . IMPRESSION: 1. Stable left para falcine subacute subdural hematoma. 2. No acute intracranial abnormality. 3. Scattered subcortical T2 hyperintensities bilaterally are mildly advanced for age. The finding is nonspecific but can be seen in the setting of chronic microvascular ischemia, a demyelinating process such as multiple sclerosis, vasculitis, complicated migraine headaches, or as the sequelae of a prior infectious or inflammatory process. Electronically Signed   By: San Morelle M.D.   On: 03/14/2020 11:42     Assessment:  67-year female with past medical history of left parafalcine subdural hematoma, anxiety-presenting to the emergency department yesterday after home health PT noticed patient was babbling her speech since 1 PM.  At that time she was hyperventilating in the room.  MRI and EEG are negative.  At this point from a neurological standpoint I feel this most likely is a panic attack/anxiety/possible conversion.  Neurology will sign off at this time please call with any questions  Etta Quill PA-C [facility] [phone] 03/14/2020, 3:12 PM

## 2020-03-14 NOTE — Progress Notes (Signed)
PT Cancellation Note  Patient Details Name: Alexandra Wolfe MRN: [id number] DOB: 19-Jul-1946   Cancelled Treatment:    Reason Eval/Treat Not Completed: Patient at procedure or test/unavailable - pt at MRI, will check back as schedule allows.  [place] Pager [phone]  Office [phone]    Roxine Caddy D Elonda Husky 03/14/2020, 10:56 AM

## 2020-03-14 NOTE — Evaluation (Addendum)
Speech Language Pathology Evaluation Patient Details Name: Alexandra Wolfe MRN: [id number] DOB: 1946-07-12 Today's Date: 03/14/2020 Time: 1025-1040 SLP Time Calculation (min) (ACUTE ONLY): 15 min  Problem List:  Patient Active Problem List   Diagnosis Date Noted  . Acute encephalopathy 03/13/2020  . Acute subdural hematoma ([place]) 02/29/2020  . Syncope 02/29/2020  . Mild renal insufficiency 02/29/2020  . Essential hypertension 02/29/2020  . Asthma 02/29/2020  . Chronic dyspnea 01/04/2020  . Allergic rhinitis 01/04/2020  . External otitis of right ear 07/25/2019  . Bilateral foot pain 07/01/2019  . Diabetic neuropathy ([place]) 03/04/2018  . Obstructive sleep apnea 04/04/2016  . Peripheral edema 12/23/2015  . Hypersomnolence 12/22/2015  . Baker's cyst of knee 11/24/2013  . Left knee pain 11/24/2013  . Dyspnea 11/24/2013  . Rash 05/26/2012  . Bilateral shoulder pain 12/13/2011  . Diabetes mellitus type II, non insulin dependent ([place]) 11/20/2011  . Leukocytosis 11/20/2011  . Preoperative clearance 11/20/2011  . Eustachian tube dysfunction 05/20/2011  . LBP (low back pain) 05/20/2011  . Encounter for well adult exam with abnormal findings 05/20/2011  . Vertigo 05/20/2011  . Hypothyroidism 02/15/2011  . Other dysphagia 02/15/2011  . URINARY INCONTINENCE 08/23/2009  . Seasonal and perennial allergic rhinitis 04/02/2009  . CONSTIPATION 09/27/2008  . Hyperlipidemia 03/11/2008  . BUNIONS, BILATERAL 12/23/2007  . Depression 09/13/2007  . OSTEOARTHROSIS NOS, LOWER LEG 09/13/2007  . COLONIC POLYPS, HX OF 09/13/2007   Past Medical History:  Past Medical History:  Diagnosis Date  . ALLERGIC RHINITIS 04/02/2009   no per pt  . Anxiety   . BACK PAIN 09/27/2008  . BUNIONS, BILATERAL 12/23/2007  . CHEST DISCOMFORT, ATYPICAL 11/07/2009  . Chronic LBP   . COLONIC POLYPS, HX OF 09/13/2007  . CONSTIPATION 09/27/2008  . DEPRESSION 09/13/2007  . Diabetes mellitus    diet controlled  .  DYSPNEA ON EXERTION 02/06/2010  . Eustachian tube dysfunction 05/20/2011  . FOOT PAIN, RIGHT 09/27/2008  . HYPERLIPIDEMIA 03/11/2008  . LBP (low back pain) 05/20/2011  . Leukocytosis 11/20/2011  . OSTEOARTHROSIS NOS, LOWER LEG 09/13/2007  . SHOULDER PAIN, LEFT 02/14/2009  . SVT (supraventricular tachycardia) ([place])   . SYMPTOM, PALPITATIONS 09/13/2007  . URINARY INCONTINENCE 08/23/2009  . Vertigo 05/20/2011   Past Surgical History:  Past Surgical History:  Procedure Laterality Date  . BUNIONECTOMY     right  . ccx    . CHOLECYSTECTOMY    . LUMBAR LAMINECTOMY    . LUMBAR LAMINECTOMY    . SHOULDER ARTHROSCOPY  12/13/2011   Procedure: ARTHROSCOPY SHOULDER;  Surgeon: Augustin Schooling;  Location: [facility];  Service: Orthopedics;  Laterality: Left;  Left Shoulder ArthroscopyDebridement Limited Tenodesis Open Rotator Cuff Repair Spur Removal Right Shoulder Injection    HPI:  pt is a 74 yo female adm to [facility] with slurred speech, aphasia and fall day prior to admit.  PMH + for SDH, anxiety, DM, prior smoker.  Pt CXR negative x low lung volumes.  Pt imaging study showed decrease in size of SDH.   Assessment / Plan / Recommendation Clinical Impression  Limited language assessment completed as RN provided pt medications during session and then MRI staff arrived to transport pt.  SLP initiated the Western Aphasia Battery - noting pt with decreased ability to correctly answer complex yes/no questions- 1/4 correctly - 25% accuracy.  Basic yes/no questions answered 5/6 correctly - with 83% accuracy.  She was able to correctly state her current address with excellent fluency. Noted dysfluencies when naming her  dog for which her son Konrad Dolores provided her a phonemic cue resulting in pt effectively articulating.   A few episodes of first sound repetition noted during session- motor planning deficits appear present for which can be compensated by providing sound cues.    Pt presenting with cognitive linguistic deficits  resulting in dysfluent expression and disorientation, decreased comprehension for complex info at this time.  Will follow up for further Western Aphasia Battery testing.  Pt and family agreeable to plan.    SLP Assessment  SLP Recommendation/Assessment: Patient needs continued Speech Lanaguage Pathology Services SLP Visit Diagnosis: Cognitive communication deficit (R41.841)    Follow Up Recommendations  Skilled Nursing facility    Frequency and Duration min 1 x/week  1 week      SLP Evaluation Cognition  Arousal/Alertness: Awake/alert Orientation Level: Oriented to person;Disoriented to time;Disoriented to situation(pt stated "[place]" and did not recall why in hospital) Attention: Focused;Sustained Focused Attention: Appears intact Sustained Attention: Impaired Sustained Attention Impairment: Verbal basic;Functional basic Memory: Impaired Memory Impairment: Storage deficit;Retrieval deficit;Decreased recall of new information(pt did not recall having an EEG done a few minutes prior to SLP session) Executive Function: (pt is not at this level of functioning) Safety/Judgment: Impaired Comments: Pt asked her son "Where did you come from?"  also asked several times "You didn't have to work today?"       Comprehension  Auditory Comprehension Overall Auditory Comprehension: Impaired Yes/No Questions: Impaired Basic Biographical Questions: 76-100% accurate Basic Immediate Environment Questions: 75-100% accurate Complex Questions: 0-24% accurate(3/4 answered incorrectly - complex thus 25%) Interfering Components: Attention Visual Recognition/Discrimination Discrimination: Not tested Reading Comprehension Reading Status: (pt needed cues to read the swallow precaution sign  - most notably to attend to the left side of the page)    Expression Expression Primary Mode of Expression: Verbal Verbal Expression Overall Verbal Expression: Impaired Initiation: Impaired Level of  Generative/Spontaneous Verbalization: Phrase;Sentence Naming: (2/2 basic items) Pragmatics: No impairment Interfering Components: Attention Non-Verbal Means of Communication: Gestures Other Verbal Expression Comments: pt gestured for shortness of breath Written Expression Dominant Hand: Right Written Expression: Not tested   Oral / Motor  Oral Motor/Sensory Function Overall Oral Motor/Sensory Function: Mild impairment Facial ROM: Suspected CN VII (facial) dysfunction Facial Symmetry: Abnormal symmetry left Facial Strength: Suspected CN VII (facial) dysfunction Facial Sensation: Within Functional Limits Velum: Within Functional Limits Mandible: Within Functional Limits Motor Speech Overall Motor Speech: Impaired Respiration: Within functional limits Phonation: Normal Resonance: Within functional limits Articulation: Within functional limitis Intelligibility: Intelligible Motor Planning: Impaired Level of Impairment: Word Motor Speech Errors: Groping for words;Inconsistent Interfering Components: Premorbid status Effective Techniques: Slow rate   GO                    Macario Golds 03/14/2020, 11:29 AM   Kathleen Lime, MS [place] Office [phone]

## 2020-03-14 NOTE — Care Management Obs Status (Signed)
[place] NOTIFICATION   Patient Details  Name: NAKERA TUGMAN MRN: [id number] Date of Birth: 23-Mar-1946   Medicare Observation Status Notification Given:  Yes    Geralynn Ochs, LCSW 03/14/2020, 3:18 PM

## 2020-03-14 NOTE — Progress Notes (Signed)
PROGRESS NOTE    Alexandra Wolfe  [id number] DOB: 1946/09/08 DOA: 03/13/2020 PCP: Biagio Borg, MD   Brief Narrative:  Alexandra Wolfe is a 74 y.o. female with a medical history of hypertension, diabetes mellitus type 2, hyperlipidemia, recent fall with subdural hematoma, who presented to the emergency department with complaints of altered mental status.  Patient apparently fell the day before presentation and was noted to have worsening aphasia that started on the day of presentation. She was recently admitted on 02/29/2020 through 03/02/2020 for subdural hematoma and was found to have vertigo with BPPV. Patient was discharged home with home health services.  Per the son patient has not had any complaints of any type of chest pain or shortness of breath, abdominal pain, nausea or vomiting, diarrhea constipation, headaches, changes in urinary frequency as of yesterday.  ED Course: Patient with altered mental status and aphasia.  CT head obtained and unremarkable for acute intracranial normalities.   Assessment & Plan:   Active Problems:   Hyperlipidemia   Depression   Hypothyroidism   Vertigo   Diabetes mellitus type II, non insulin dependent (HCC)   Acute subdural hematoma (HCC)   Essential hypertension   Acute encephalopathy  Acute encephalopathy, possibly metabolic -Patient presented with aphasia.  Not following commands. -CT head showed no evidence of acute intercranial normality.  Decreased size of left parafalcine subdural hematoma. -Was admitted on March 23 after falling and noted to have subdural hematoma at that time. -Was discharged on 03/02/2020-during that admission it appears that patient was neurologically intact and was alert and oriented - She was started on Keppra for seizures -Neurology consulted.  -Ordered UA.  However, no fevers or leukocytosis noted at this time. -MRI brain and EEG ordered. -Of note patient did have echocardiogram on 03/01/2020 which showed an  EF of 50 to 55%, mid and basal posterior lateral wall hypokinesis.  LV diastolic parameters normal.  Essential hypertension -Given possibility of CVA, will allow for permissive hypertension at this time - hold metoprolol, HCTZ  Diabetes mellitus, type II -hold metformin, glipizide, Jardiance -Placed on insulin sliding scale with CBG monitoring  Hyperlipidemia -Continue statin  Hypothyroidism -Continue Armour -TSH 4.791.  Free T4 0.78.  Asthma -Currently no wheezing on exam -Continue home medications  Obesity -BMI of 36.87 -We will need to discuss lifestyle modifications with PCP upon discharge  Recent diagnosis of vertigo/BPPV -hold meclizine at this time - PT/OT consult  DVT prophylaxis: SCDs  Code Status: Full  Family Communication: No family at bedside. Attempted to reach family.  Disposition Plan: Home   Consults called: neurology      Antimicrobials:   None   Subjective: Patient getting EEG.  Confused, not following commands.  Objective: Vitals:   03/14/20 0856 03/14/20 1146 03/14/20 1309 03/14/20 1608  BP: (!) 156/58 (!) 139/52 (!) 111/54 115/71  Pulse: 92 90 78 97  Resp: 18 20 20 20   Temp: 98.1 F (36.7 C) 98.1 F (36.7 C) 97.7 F (36.5 C) 98 F (36.7 C)  TempSrc: Oral Oral Oral Oral  SpO2: 97% 97% 95% 100%  Weight:      Height:        Intake/Output Summary (Last 24 hours) at 03/14/2020 1834 Last data filed at 03/14/2020 1532 Gross per 24 hour  Intake 938.71 ml  Output 375 ml  Net 563.71 ml   Filed Weights   03/13/20 1433  Weight: 110 kg    Examination: General exam: Appears stated age, not in any  acute distress at this time Respiratory system: Clear to auscultation. Respiratory effort normal. Cardiovascular system: S1 & S2 heard, RRR. No JVD, murmurs, rubs, gallops or clicks. No pedal edema. Gastrointestinal system: Abdomen is nondistended, soft and nontender. No organomegaly or masses felt. Normal bowel sounds  heard. Central nervous system: She is awake, difficult to understand speech, not following commands.  Unable to do a neurologic exam at this time Extremities: No edema Skin: No rashes Psychiatry: Unable to assess at this time.    Data Reviewed: I have personally reviewed following labs and imaging studies  CBC: Recent Labs  Lab 03/13/20 1436 03/13/20 1511  WBC 8.9  --   NEUTROABS 4.6  --   HGB 14.8 14.6  HCT 44.8 43.0  MCV 89.6  --   PLT 381  --    Basic Metabolic Panel: Recent Labs  Lab 03/13/20 1436 03/13/20 1511  NA 138 137  K 4.1 3.8  CL 104 105  CO2 16*  --   GLUCOSE 98 96  BUN 24* 24*  CREATININE 1.07* 0.90  CALCIUM 9.9  --    GFR: Estimated Creatinine Clearance: 71.3 mL/min (by C-G formula based on SCr of 0.9 mg/dL). Liver Function Tests: Recent Labs  Lab 03/13/20 1436  AST 33  ALT 28  ALKPHOS 66  BILITOT 0.7  PROT 7.1  ALBUMIN 4.3   No results for input(s): LIPASE, AMYLASE in the last 168 hours. No results for input(s): AMMONIA in the last 168 hours. Coagulation Profile: Recent Labs  Lab 03/13/20 1436  INR 1.0   Cardiac Enzymes: No results for input(s): CKTOTAL, CKMB, CKMBINDEX, TROPONINI in the last 168 hours. BNP (last 3 results) No results for input(s): PROBNP in the last 8760 hours. HbA1C: Recent Labs    03/13/20 1913 03/14/20 0333  HGBA1C 5.9* 5.9*   CBG: Recent Labs  Lab 03/14/20 0019 03/14/20 0357 03/14/20 0808 03/14/20 1159 03/14/20 1616  GLUCAP 105* 115* 133* 149* 105*   Lipid Profile: Recent Labs    03/14/20 0333  CHOL 129  HDL 31*  LDLCALC 61  TRIG 185*  CHOLHDL 4.2   Thyroid Function Tests: Recent Labs    03/13/20 2246  TSH 4.791*  FREET4 0.78   Anemia Panel: No results for input(s): VITAMINB12, FOLATE, FERRITIN, TIBC, IRON, RETICCTPCT in the last 72 hours. Sepsis Labs: No results for input(s): PROCALCITON, LATICACIDVEN in the last 168 hours.  Recent Results (from the past 240 hour(s))  SARS  CORONAVIRUS 2 (TAT 6-24 HRS) Nasopharyngeal Nasopharyngeal Swab     Status: None   Collection Time: 03/13/20  5:25 PM   Specimen: Nasopharyngeal Swab  Result Value Ref Range Status   SARS Coronavirus 2 NEGATIVE NEGATIVE Final    Comment: (NOTE) SARS-CoV-2 target nucleic acids are NOT DETECTED. The SARS-CoV-2 RNA is generally detectable in upper and lower respiratory specimens during the acute phase of infection. Negative results do not preclude SARS-CoV-2 infection, do not rule out co-infections with other pathogens, and should not be used as the sole basis for treatment or other patient management decisions. Negative results must be combined with clinical observations, patient history, and epidemiological information. The expected result is Negative. Fact Sheet for Patients: SugarRoll.be Fact Sheet for Healthcare Providers: https://www.woods-mathews.com/ This test is not yet approved or cleared by the [place] FDA and  has been authorized for detection and/or diagnosis of SARS-CoV-2 by FDA under an Emergency Use Authorization (EUA). This EUA will remain  in effect (meaning this test can be used) for  the duration of the COVID-19 declaration under Section 56 4(b)(1) of the Act, 21 U.S.C. section 360bbb-3(b)(1), unless the authorization is terminated or revoked sooner. Performed at [facility], [place] [address] [place], [place] [address]          Radiology Studies: EEG  Result Date: 03/14/2020 Lora Havens, MD     03/14/2020  2:22 PM Patient Name: DEANNIE PARREIRA MRN: [id number] Epilepsy Attending: Lora Havens Referring Physician/Provider: Dr Cristal Ford Date: 03/14/2020 Duration: 26.15 minutes Patient history: 74 year old female with medical history of left parafalcine subdural hematoma presented for slurred speech and confusion.  EEG to evaluate for seizures. Level of alertness: Awake AEDs during EEG study: Keppra  Technical aspects: This EEG study was done with scalp electrodes positioned according to the 10-20 International system of electrode placement. Electrical activity was acquired at a sampling rate of 500Hz  and reviewed with a high frequency filter of 70Hz  and a low frequency filter of 1Hz . EEG data were recorded continuously and digitally stored. Description: The posterior dominant rhythm consists of 9 Hz activity of moderate voltage (25-35 uV) seen predominantly in posterior head regions, symmetric and reactive to eye opening and eye closing.  EEG showed intermittent generalized and maximal left frontotemporal region 2 to 3 Hz delta slowing. Hyperventilation and photic stimulation were not performed. Abnormality -Intermittent slow, generalized and maximal left frontal region IMPRESSION: This study is suggestive of cortical dysfunction in left frontal region as well as mild diffuse encephalopathy, nonspecific to etiology.  No seizures or epileptiform discharges were seen throughout the recording. Lora Havens   CT HEAD WO CONTRAST  Result Date: 03/13/2020 CLINICAL DATA:  Possible stroke. Aphasia with increased respiratory rate. Fall yesterday. EXAM: CT HEAD WITHOUT CONTRAST CT CERVICAL SPINE WITHOUT CONTRAST TECHNIQUE: Multidetector CT imaging of the head and cervical spine was performed following the standard protocol without intravenous contrast. Multiplanar CT image reconstructions of the cervical spine were also generated. COMPARISON:  Head CT 03/01/2020 and cervical spine CT 02/29/2020 FINDINGS: CT HEAD FINDINGS Brain: There is a small residual subdural hematoma along the left aspect of the falx which has decreased in size and density compared to the prior study and now measures up to 9 mm in thickness (previously 10 mm). There is minimal residual subdural blood layering along the left tentorium. No new intracranial hemorrhage, acute infarct, mass, or midline shift is identified. Mild cerebral atrophy is  within normal limits for age. Hypodensities in the cerebral white matter bilaterally are nonspecific but compatible with mild chronic small vessel ischemic disease. Vascular: Calcified atherosclerosis at the skull base. No hyperdense vessel. Skull: No fracture or suspicious osseous lesion. Sinuses/Orbits: Paranasal sinuses and mastoid air cells are clear. Bilateral cataract extraction. Other: None. CT CERVICAL SPINE FINDINGS Alignment: Cervical spine straightening. Trace anterolisthesis of C7 on T1, likely degenerative and facet mediated. Skull base and vertebrae: No acute fracture or suspicious osseous lesion. Soft tissues and spinal canal: No prevertebral fluid or swelling. No visible canal hematoma. Disc levels: Unchanged disc degeneration including advanced disc space narrowing, degenerative endplate irregularity, and sclerosis at C5-6 and C6-7. Severe right-sided facet arthrosis at C2-3 and C4-5. Moderate to severe neural foraminal stenosis on the left at C3-4 and C5-6 due to uncovertebral spurring. Upper chest: Clear lung apices. Other: Calcified plaque at the left carotid bifurcation. IMPRESSION: 1. No evidence of acute intracranial abnormality. 2. Decreased size of left parafalcine subdural hematoma. 3. No acute cervical spine fracture. Electronically Signed   By: Zenia Resides  Jeralyn Ruths M.D.   On: 03/13/2020 16:39   CT Cervical Spine Wo Contrast  Result Date: 03/13/2020 CLINICAL DATA:  Possible stroke. Aphasia with increased respiratory rate. Fall yesterday. EXAM: CT HEAD WITHOUT CONTRAST CT CERVICAL SPINE WITHOUT CONTRAST TECHNIQUE: Multidetector CT imaging of the head and cervical spine was performed following the standard protocol without intravenous contrast. Multiplanar CT image reconstructions of the cervical spine were also generated. COMPARISON:  Head CT 03/01/2020 and cervical spine CT 02/29/2020 FINDINGS: CT HEAD FINDINGS Brain: There is a small residual subdural hematoma along the left aspect of the falx  which has decreased in size and density compared to the prior study and now measures up to 9 mm in thickness (previously 10 mm). There is minimal residual subdural blood layering along the left tentorium. No new intracranial hemorrhage, acute infarct, mass, or midline shift is identified. Mild cerebral atrophy is within normal limits for age. Hypodensities in the cerebral white matter bilaterally are nonspecific but compatible with mild chronic small vessel ischemic disease. Vascular: Calcified atherosclerosis at the skull base. No hyperdense vessel. Skull: No fracture or suspicious osseous lesion. Sinuses/Orbits: Paranasal sinuses and mastoid air cells are clear. Bilateral cataract extraction. Other: None. CT CERVICAL SPINE FINDINGS Alignment: Cervical spine straightening. Trace anterolisthesis of C7 on T1, likely degenerative and facet mediated. Skull base and vertebrae: No acute fracture or suspicious osseous lesion. Soft tissues and spinal canal: No prevertebral fluid or swelling. No visible canal hematoma. Disc levels: Unchanged disc degeneration including advanced disc space narrowing, degenerative endplate irregularity, and sclerosis at C5-6 and C6-7. Severe right-sided facet arthrosis at C2-3 and C4-5. Moderate to severe neural foraminal stenosis on the left at C3-4 and C5-6 due to uncovertebral spurring. Upper chest: Clear lung apices. Other: Calcified plaque at the left carotid bifurcation. IMPRESSION: 1. No evidence of acute intracranial abnormality. 2. Decreased size of left parafalcine subdural hematoma. 3. No acute cervical spine fracture. Electronically Signed   By: Logan Bores M.D.   On: 03/13/2020 16:39   MR BRAIN WO CONTRAST  Result Date: 03/14/2020 CLINICAL DATA:  Subdural hematoma 2 weeks ago with recent change in mental status. EXAM: MRI HEAD WITHOUT CONTRAST TECHNIQUE: Multiplanar, multiecho pulse sequences of the brain and surrounding structures were obtained without intravenous contrast.  COMPARISON:  CT head without contrast 03/13/2020, 03/01/2020, and 02/29/2020. FINDINGS: Brain: The left para falcine subacute subdural hematoma is again noted. Remote lacunar infarcts are present within the internal capsule bilaterally. No acute infarct is present. Scattered subcortical T2 hyperintensities bilaterally are mildly advanced for age. The ventricles are of normal size. No significant extraaxial fluid collection is present. The internal auditory canals are within normal limits. The brainstem and cerebellum are within normal limits. Vascular: Flow is present in the major intracranial arteries. Skull and upper cervical spine: The craniocervical junction is normal. Upper cervical spine is within normal limits. Marrow signal is unremarkable. Sinuses/Orbits: The paranasal sinuses and mastoid air cells are clear. Bilateral lens replacements are noted. Globes and orbits are otherwise unremarkable. IMPRESSION: 1. Stable left para falcine subacute subdural hematoma. 2. No acute intracranial abnormality. 3. Scattered subcortical T2 hyperintensities bilaterally are mildly advanced for age. The finding is nonspecific but can be seen in the setting of chronic microvascular ischemia, a demyelinating process such as multiple sclerosis, vasculitis, complicated migraine headaches, or as the sequelae of a prior infectious or inflammatory process. Electronically Signed   By: San Morelle M.D.   On: 03/14/2020 11:42   DG Chest Portable 1 View  Result  Date: 03/13/2020 CLINICAL DATA:  Altered mental status.  Diabetes.  Hyperlipidemia. EXAM: PORTABLE CHEST 1 VIEW COMPARISON:  02/29/2020 FINDINGS: Midline trachea. Borderline cardiomegaly. Atherosclerosis in the transverse aorta. No pleural effusion or pneumothorax. No congestive failure. Low lung volumes with resultant pulmonary interstitial prominence. No lobar consolidation. IMPRESSION: Low lung volumes, without acute disease. Aortic Atherosclerosis (ICD10-I70.0).  Electronically Signed   By: Abigail Miyamoto M.D.   On: 03/13/2020 18:25        Scheduled Meds: . atorvastatin  80 mg Oral Daily  . fluticasone  2 spray Each Nare Daily  . insulin aspart  0-9 Units Subcutaneous Q4H  . levETIRAcetam  500 mg Oral BID  . loratadine  10 mg Oral Daily  . mirabegron ER  50 mg Oral QHS  . mometasone-formoterol  2 puff Inhalation BID  . multivitamin with minerals  1 tablet Oral Daily  . thyroid  30 mg Oral Daily   Continuous Infusions: . sodium chloride 50 mL/hr at 03/14/20 1828     LOS: 0 days    Yaakov Guthrie, MD [facility]   To contact the attending provider between 7A-7P or the covering provider during after hours 7P-7A, please log into the web site [email or web address] and access using universal [place] password for that web site. If you do not have the password, please call the hospital operator.  03/14/2020, 6:34 PM

## 2020-03-14 NOTE — Evaluation (Signed)
Clinical/Bedside Swallow Evaluation Patient Details  Name: Alexandra Wolfe MRN: [id number] Date of Birth: 26-Aug-1946  Today's Date: 03/14/2020 Time: SLP Start Time (ACUTE ONLY): 0745 SLP Stop Time (ACUTE ONLY): 0820 SLP Time Calculation (min) (ACUTE ONLY): 35 min  Past Medical History:  Past Medical History:  Diagnosis Date  . ALLERGIC RHINITIS 04/02/2009   no per pt  . Anxiety   . BACK PAIN 09/27/2008  . BUNIONS, BILATERAL 12/23/2007  . CHEST DISCOMFORT, ATYPICAL 11/07/2009  . Chronic LBP   . COLONIC POLYPS, HX OF 09/13/2007  . CONSTIPATION 09/27/2008  . DEPRESSION 09/13/2007  . Diabetes mellitus    diet controlled  . DYSPNEA ON EXERTION 02/06/2010  . Eustachian tube dysfunction 05/20/2011  . FOOT PAIN, RIGHT 09/27/2008  . HYPERLIPIDEMIA 03/11/2008  . LBP (low back pain) 05/20/2011  . Leukocytosis 11/20/2011  . OSTEOARTHROSIS NOS, LOWER LEG 09/13/2007  . SHOULDER PAIN, LEFT 02/14/2009  . SVT (supraventricular tachycardia) ([place])   . SYMPTOM, PALPITATIONS 09/13/2007  . URINARY INCONTINENCE 08/23/2009  . Vertigo 05/20/2011   Past Surgical History:  Past Surgical History:  Procedure Laterality Date  . BUNIONECTOMY     right  . ccx    . CHOLECYSTECTOMY    . LUMBAR LAMINECTOMY    . LUMBAR LAMINECTOMY    . SHOULDER ARTHROSCOPY  12/13/2011   Procedure: ARTHROSCOPY SHOULDER;  Surgeon: Augustin Schooling;  Location: [facility];  Service: Orthopedics;  Laterality: Left;  Left Shoulder ArthroscopyDebridement Limited Tenodesis Open Rotator Cuff Repair Spur Removal Right Shoulder Injection    HPI:  pt is a 74 yo female adm to [facility] with slurred speech and aphasia an .  PMH + for SDH, anxiety, DM, prior smoker.  Pt CXR negative x low lung volumes.  Pt imaging study showed decrease in size of SDH.   Assessment / Plan / Recommendation Clinical Impression  Pt presents with a single indication of concerns for aspiration with first bolus of evaluation - c/b immediate coughing post-=swallow.  She admits to  premorbid coughing with liquids approx 1-2 times daily.  Facial nerve deficit noted resulting in asymmetry of left face and decreased labial seal.  Pt able to self feed cracker, pudding, water via cup/straw.  NO further indications of airway compromise with po intake. Advised pt to trial chin tuck posture with liquids to see if prevents coughing as suspect possible premature spillage into pharynx/larynx before swallowing as contributing factor.  Recommend regular/thin utilizing chin tuck and medications with puree whole. Pt denies GERD.  Will follow up x1 re: swallowing to assure pt is tolerating given premorbid issues and to determine if chin tuck posture effectively prevents choking with liquids.  . Pt educated to recommendations using teach back and providing writen instructions. SLP Visit Diagnosis: Dysphagia, unspecified (R13.10)    Aspiration Risk  Mild aspiration risk    Diet Recommendation Regular;Thin liquid   Liquid Administration via: Straw;Cup Medication Administration: Whole meds with puree Supervision: Patient able to self feed Compensations: Slow rate;Small sips/bites;Chin tuck;Use straw to facilitate chin tuck Postural Changes: Seated upright at 90 degrees;Remain upright for at least 30 minutes after po intake    Other  Recommendations Oral Care Recommendations: Oral care BID   Follow up Recommendations   tbd     Frequency and Duration min 1 x/week  1 week       Prognosis Prognosis for Safe Diet Advancement: Guarded      Swallow Study   General Date of Onset: 03/14/20 HPI: pt is a 74  yo female adm to [facility] with slurred speech and aphasia an .  PMH + for SDH, anxiety, DM, prior smoker.  Pt CXR negative x low lung volumes.  Pt imaging study showed decrease in size of SDH. Type of Study: Bedside Swallow Evaluation Diet Prior to this Study: NPO Temperature Spikes Noted: No Respiratory Status: Room air History of Recent Intubation: No Behavior/Cognition:  Alert;Cooperative Oral Cavity Assessment: Within Functional Limits Oral Care Completed by SLP: No Oral Cavity - Dentition: Adequate natural dentition Vision: Functional for self-feeding Self-Feeding Abilities: Able to feed self Patient Positioning: Upright in bed Baseline Vocal Quality: Low vocal intensity Volitional Cough: Weak Volitional Swallow: Able to elicit    Oral/Motor/Sensory Function Overall Oral Motor/Sensory Function: Mild impairment Facial ROM: Suspected CN VII (facial) dysfunction Facial Symmetry: Abnormal symmetry left Facial Strength: Suspected CN VII (facial) dysfunction Facial Sensation: Within Functional Limits Velum: Within Functional Limits Mandible: Within Functional Limits   Ice Chips Ice chips: Impaired Pharyngeal Phase Impairments: Cough - Immediate   Thin Liquid Thin Liquid: Within functional limits Presentation: Cup;Straw;Self Fed Other Comments: pt passed yale 3 ounce water test but functionally admits to choking on liquids at least twice a day    Nectar Thick Nectar Thick Liquid: Not tested   Honey Thick Honey Thick Liquid: Not tested   Puree Puree: Within functional limits Presentation: Self Fed;Spoon   Solid     Solid: Within functional limits Presentation: Self Fredirick Lathe 03/14/2020,8:23 AM  Kathleen Lime, MS [place] Office [phone]

## 2020-03-14 NOTE — Progress Notes (Signed)
Occupational Therapy Evaluation. PTA, pt living at home with son. Pt with recent admission (3/23-3/25 for fall) and discharged home with son. Pt readmitted due to AMS and falls. Pt with significant cognitive impairments in addition to deficits listed below. Required Min A with mobility and Mod A with ADL tasks.  Pt will benefit from rehab at SNF to maximize functional level fo independence. Will follow acutely.    03/14/20 1700  OT Visit Information  Last OT Received On 03/14/20  Assistance Needed +1  History of Present Illness 74 yo female admitted to ED on 4/4 with AMS, speech changes, fall on 4/3. CT head negative for acute findings, C-spine cleared, and MRI with no acute findings. Pt with recent admission 3/23-3/25 for fall, SDH which is now smaller per CT. PMH includes vertigo, LBP, HLD, DOE, depression, anxiety.  Precautions  Precautions Fall  Precaution Comments dizzy, unsteady, fell overnight, so make sure alarms are set.   Home Living  Family/patient expects to be discharged to: Private residence  Living Arrangements Children  Available Help at Discharge Family;Available PRN/intermittently  Type of Home House  Home Access Stairs to enter  Entrance Stairs-Number of Steps 2  Entrance Stairs-Rails None  Home Layout One level  Bathroom Shower/Tub Tub/shower unit  Tax adviser - 2 wheels;BSC;Shower seat  Prior Function  Level of Independence Independent with assistive device(s);Needs assistance  Gait / Transfers Assistance Needed RW to walk around home at all times  ADL's / [place] receiving assist for dressing self  Communication  Communication Expressive difficulties  Pain Assessment  Pain Assessment Faces  Faces Pain Scale 4  Pain Location general discomfort  Pain Descriptors / Indicators Moaning;Grimacing;Discomfort  Pain Intervention(s) Limited activity within patient's tolerance  Cognition  Arousal/Alertness  Awake/alert  Behavior During Therapy Impulsive;Flat affect  Overall Cognitive Status Impaired/Different from baseline  Area of Impairment Orientation;Attention;Memory;Following commands;Safety/judgement;Awareness;Problem solving  Orientation Level Disoriented to;Time (March; 1922)  Current Attention Level Sustained  Memory Decreased short-term memory  Following Commands Follows one step commands inconsistently  Safety/Judgement Decreased awareness of safety;Decreased awareness of deficits  Awareness Intellectual  Problem Solving Slow processing;Difficulty sequencing;Requires verbal cues;Requires tactile cues  General Comments Pt giving different answers form prior PT session. Staes she lives with her son who is a cop. Knows she is al [facility], fell last night and the night before.   Upper Extremity Assessment  Upper Extremity Assessment Generalized weakness  LUE Deficits / Details hx of rotator cuff repair  Lower Extremity Assessment  Lower Extremity Assessment Defer to PT evaluation  Cervical / Trunk Assessment  Cervical / Trunk Assessment Normal  ADL  Overall ADL's  Needs assistance/impaired  Eating/Feeding Supervision/ safety  Grooming Set up;Supervision/safety  Grooming Details (indicate cue type and reason) dissheveled appearance  Upper Body Bathing Supervision/ safety;Set up;Sitting  Lower Body Bathing Moderate assistance;Sit to/from stand  Upper Body Dressing  Minimal assistance;Sitting  Lower Body Dressing Moderate assistance;Sit to/from Retail buyer Minimal assistance;Stand-pivot;BSC  Toileting- Forensic psychologist Min guard;Sit to/from stand;Sitting/lateral lean  Toileting - Clothing Manipulation Details (indicate cue type and reason) vc for task completion  Functional mobility during ADLs Minimal assistance  Vision- Assessment  Additional Comments will further assess; pt unable to state this session. Conjugate gaze  Perception  Comments will further  assess  Bed Mobility  General bed mobility comments OOB in chair  Transfers  Overall transfer level Needs assistance  Equipment used 1 person hand held assist  Transfers  Sit to/from [facility]  Sit to Stand Min assist  Stand pivot transfers Min assist  General transfer comment moaning during transfer. When asked why she was moaning she stated seh was tired  Balance  Overall balance assessment Needs assistance;History of Falls  Sitting balance-Leahy Scale Fair  Standing balance-Leahy Scale Poor  OT - End of Session  Activity Tolerance Patient tolerated treatment well  Patient left in chair;with call bell/phone within reach;with chair alarm set  Nurse Communication Mobility status  OT Assessment  OT Recommendation/Assessment Patient needs continued OT Services  OT Visit Diagnosis Unsteadiness on feet (R26.81);Other abnormalities of gait and mobility (R26.89);History of falling (Z91.81);Other symptoms and signs involving cognitive function;Pain  Pain - part of body  (generalized)  OT Problem List Decreased strength;Decreased activity tolerance;Impaired balance (sitting and/or standing);Decreased cognition;Decreased safety awareness;Decreased knowledge of use of DME or AE;Obesity;Pain  OT Plan  OT Frequency (ACUTE ONLY) Min 2X/week  OT Treatment/Interventions (ACUTE ONLY) Self-care/ADL training;Therapeutic exercise;Neuromuscular education;Energy conservation;DME and/or AE instruction;Therapeutic activities;Cognitive remediation/compensation;Patient/family education;Balance training  AM-PAC OT "6 Clicks" Daily Activity Outcome Measure (Version 2)  Help from another person eating meals? 3  Help from another person taking care of personal grooming? 3  Help from another person toileting, which includes using toliet, bedpan, or urinal? 2  Help from another person bathing (including washing, rinsing, drying)? 2  Help from another person to put on and taking off regular upper  body clothing? 3  Help from another person to put on and taking off regular lower body clothing? 2  6 Click Score 15  OT Recommendation  Follow Up Recommendations SNF;Supervision/Assistance - 24 hour  OT Equipment None recommended by OT  Individuals Consulted  Consulted and Agree with Results and Recommendations Patient unable/family or caregiver not available  Acute Rehab OT Goals  Patient Stated Goal to watch TV  OT Goal Formulation With patient  Time For Goal Achievement 03/28/20  Potential to Achieve Goals Good  OT Time Calculation  OT Start Time (ACUTE ONLY) 1700  OT Stop Time (ACUTE ONLY) 1720  OT Time Calculation (min) 20 min  OT General Charges  $OT Visit 1 Visit  OT Evaluation  $OT Eval Moderate Complexity 1 Mod  Written Expression  Dominant Hand Right  Maurie Boettcher, OT/L   Acute OT Clinical Specialist Acute Rehabilitation Services Pager [phone] Office [phone]

## 2020-03-14 NOTE — Procedures (Signed)
Patient Name: CHIAMAKA FLITCRAFT  MRN: [id number]  Epilepsy Attending: Lora Havens  Referring Physician/Provider: Dr Cristal Ford Date: 03/14/2020 Duration: 26.15 minutes  Patient history: 74 year old female with medical history of left parafalcine subdural hematoma presented for slurred speech and confusion.  EEG to evaluate for seizures.  Level of alertness: Awake  AEDs during EEG study: Keppra  Technical aspects: This EEG study was done with scalp electrodes positioned according to the 10-20 International system of electrode placement. Electrical activity was acquired at a sampling rate of 500Hz  and reviewed with a high frequency filter of 70Hz  and a low frequency filter of 1Hz . EEG data were recorded continuously and digitally stored.   Description: The posterior dominant rhythm consists of 9 Hz activity of moderate voltage (25-35 uV) seen predominantly in posterior head regions, symmetric and reactive to eye opening and eye closing.  EEG showed intermittent generalized and maximal left frontotemporal region 2 to 3 Hz delta slowing. Hyperventilation and photic stimulation were not performed.  Abnormality -Intermittent slow, generalized and maximal left frontal region  IMPRESSION: This study is suggestive of cortical dysfunction in left frontal region as well as mild diffuse encephalopathy, nonspecific to etiology.  No seizures or epileptiform discharges were seen throughout the recording.  Junetta Hearn Barbra Sarks

## 2020-03-15 ENCOUNTER — Inpatient Hospital Stay: Payer: Medicare Other | Admitting: Internal Medicine

## 2020-03-15 DIAGNOSIS — E785 Hyperlipidemia, unspecified: Secondary | ICD-10-CM | POA: Diagnosis not present

## 2020-03-15 DIAGNOSIS — I10 Essential (primary) hypertension: Secondary | ICD-10-CM | POA: Diagnosis not present

## 2020-03-15 DIAGNOSIS — E119 Type 2 diabetes mellitus without complications: Secondary | ICD-10-CM | POA: Diagnosis not present

## 2020-03-15 DIAGNOSIS — S065X9A Traumatic subdural hemorrhage with loss of consciousness of unspecified duration, initial encounter: Secondary | ICD-10-CM | POA: Diagnosis not present

## 2020-03-15 LAB — BASIC METABOLIC PANEL
Anion gap: 11 (ref 5–15)
BUN: 11 mg/dL (ref 8–23)
CO2: 20 mmol/L — ABNORMAL LOW (ref 22–32)
Calcium: 8.7 mg/dL — ABNORMAL LOW (ref 8.9–10.3)
Chloride: 110 mmol/L (ref 98–111)
Creatinine, Ser: 0.76 mg/dL (ref 0.44–1.00)
GFR calc Af Amer: >60 mL/min (ref >60)
GFR calc non Af Amer: >60 mL/min (ref >60)
Glucose, Bld: 111 mg/dL — ABNORMAL HIGH (ref 70–99)
Potassium: 3.4 mmol/L — ABNORMAL LOW (ref 3.5–5.1)
Sodium: 141 mmol/L (ref 135–145)

## 2020-03-15 LAB — CBC
HCT: 40.4 % (ref 36.0–46.0)
Hemoglobin: 13.1 g/dL (ref 12.0–15.0)
MCH: 29.4 pg (ref 26.0–34.0)
MCHC: 32.4 g/dL (ref 30.0–36.0)
MCV: 90.8 fL (ref 80.0–100.0)
Platelets: 253 10*3/uL (ref 150–400)
RBC: 4.45 MIL/uL (ref 3.87–5.11)
RDW: 14.2 % (ref 11.5–15.5)
WBC: 6.2 10*3/uL (ref 4.0–10.5)
nRBC: 0 % (ref 0.0–0.2)

## 2020-03-15 LAB — GLUCOSE, CAPILLARY
Glucose-Capillary: 112 mg/dL — ABNORMAL HIGH (ref 70–99)
Glucose-Capillary: 109 mg/dL — ABNORMAL HIGH (ref 70–99)
Glucose-Capillary: 111 mg/dL — ABNORMAL HIGH (ref 70–99)
Glucose-Capillary: 105 mg/dL — ABNORMAL HIGH (ref 70–99)
Glucose-Capillary: 100 mg/dL — ABNORMAL HIGH (ref 70–99)

## 2020-03-15 MED ORDER — POTASSIUM CHLORIDE CRYS ER 20 MEQ PO TBCR
40.0000 meq | EXTENDED_RELEASE_TABLET | Freq: Once | ORAL | Status: AC
  Administered 2020-03-15: 40 meq via ORAL
  Filled 2020-03-15: qty 2

## 2020-03-15 NOTE — Progress Notes (Signed)
  Speech Language Pathology Treatment: Dysphagia  Patient Details Name: Alexandra Wolfe MRN: [id number] DOB: 05/16/1946 Today's Date: 03/15/2020 Time: [id number] SLP Time Calculation (min) (ACUTE ONLY): 17 min  Assessment / Plan / Recommendation Clinical Impression  Pt observed with her entire breakfast *peaches, yogurt, few bites of potatoe/eggs, coffee, water and OJ.  She tolerated breakfast meal without indications of aspiration nor oral retention, she is also clearing her mouth better with before consuming liquids.  In addition pt was able to pass the [place] 3 ounce water test today.  Chin tuck posture is not remembered nor at this time does SLP think it's needed.    Recommend pt continue regular/thin diet - and no follow up regarding swallowing needed.    Pt likely intermittently aspirates due to her oral control issues but she is more alert/interactive today than yesterday.   Using teach back, educated pt to water being safest liquid to consume/mildly aspirate and clinical reasoning for taking medicine with puree - to which she taught back recommendations and stated understanding.   SLP left note for son Alexandra Wolfe regarding recommendations for pt's care plan.  Alexandra Wolfe and his significant other were present yesterday during the session.   No follow up for swallowing indicated at this time but will continue to see pt for cognitive linguistic treatment to decrease caregiver burden.    HPI HPI: pt is a 74 yo female adm to [facility] with slurred speech, aphasia and fall day prior to admit.  PMH + for SDH, anxiety, DM, prior smoker.  Pt CXR negative x low lung volumes.  Pt imaging study showed decrease in size of SDH.      SLP Plan  Other (Comment)(no follow up for swallowing)       Recommendations  Diet recommendations: Regular;Thin liquid Liquids provided via: Straw Medication Administration: Whole meds with puree Supervision: Intermittent supervision to cue for compensatory  strategies Compensations: Slow rate;Small sips/bites Postural Changes and/or Swallow Maneuvers: Seated upright 90 degrees;Upright 30-60 min after meal;Chin tuck                Oral Care Recommendations: Oral care BID Follow up Recommendations: Skilled Nursing facility SLP Visit Diagnosis: Dysphagia, oral phase (R13.11) Plan: Other (Comment)(no follow up for swallowing)       GO               Kathleen Lime, MS [facility] SLP Acute Rehab Services Office [phone]  Macario Golds 03/15/2020, 10:08 AM

## 2020-03-15 NOTE — Progress Notes (Signed)
PROGRESS NOTE    Alexandra Wolfe  FIE:[id number] DOB: 1946/06/05 DOA: 03/13/2020 PCP: Biagio Borg, MD   Brief Narrative:  74 year old lady prior history of hypertension, type 2 diabetes, hyperlipidemia recent fall with subdural hematoma presents to ED with complaint of altered mental status.  Patient was recently admitted on February 29 2020 and was discharged on March 02 2020 for subdural hematoma and was also found to have vertigo with BPPV.  She was subsequently discharged home with home health services.  Repeat CT of the head is unremarkable and is negative for acute intracranial abnormalities.  Assessment & Plan:   Active Problems:   Hyperlipidemia   Depression   Hypothyroidism   Vertigo   Diabetes mellitus type II, non insulin dependent (HCC)   Acute subdural hematoma (HCC)   Essential hypertension   Acute encephalopathy  \  Acute metabolic encephalopathy Resolved Patient is currently alert and oriented and answering questions appropriately. EEG does not show any seizures or epileptiform discharges. MRI of the brain is negative for any acute stroke.  Neurology consulted and have signed off.  No new recommendations at this time.  Hypokalemia:  Replaced.   Essential hypertension Blood pressure parameters are better controlled today.  Resume metoprolol on discharge.   Type II diabetes mellitus Continue with sliding scale insulin. Hold glipizide and Jardiance and resume Metformin on discharge.  Hemoglobin A1c is 5.9.   Hyperlipidemia Continue with statin.   Hypothyroidism Continue with [place]   Asthma Continue with home medications.    Obesity Body mass index is 36.87 kg/m. -Outpatient follow-up PCP for lifestyle modification and weight loss.   History of BPPV and vertigo PT/OT evaluation center.  TOC on board for SNF placement.   DVT prophylaxis: scd's Code Status: full code.  Family Communication: none at bedside.  Disposition Plan:  . Patient  came from: home           . Anticipated d/c place:SNF  . Barriers to d/c OR conditions which need to be met to effect a safe d/c: PENDING SNF BED.    Consultants:  Neurology.   Procedures: (MRI brain.  EEG.    Antimicrobials:none.    Subjective: No new complaints.   Objective: Vitals:   03/15/20 0400 03/15/20 0853 03/15/20 0855 03/15/20 1151  BP: (!) 145/65 (!) 136/52  118/61  Pulse: 82 76  76  Resp: 20 20  20   Temp: 98.1 F (36.7 C) 97.8 F (36.6 C)  97.8 F (36.6 C)  TempSrc: Oral Oral  Oral  SpO2: 97% 98% 97% 100%  Weight:      Height:        Intake/Output Summary (Last 24 hours) at 03/15/2020 1536 Last data filed at 03/15/2020 0300 Gross per 24 hour  Intake 400 ml  Output --  Net 400 ml   Filed Weights   03/13/20 1433  Weight: 110 kg    Examination:  General exam: Appears calm and comfortable  Respiratory system: Clear to auscultation. Respiratory effort normal. Cardiovascular system: S1 & S2 heard, RRR. No JVD, murmurs, rubs, gallops or clicks. No pedal edema. Gastrointestinal system: Abdomen is nondistended, soft and nontender. No organomegaly or masses felt. Normal bowel sounds heard. Central nervous system: Alert and oriented. No focal neurological deficits. Extremities: Symmetric 5 x 5 power. Skin: No rashes, lesions or ulcers Psychiatry: Judgement and insight appear normal. Mood & affect appropriate.     Data Reviewed: I have personally reviewed following labs and imaging studies  CBC: Recent Labs  Lab 03/13/20 1436 03/13/20 1511 03/15/20 0355  WBC 8.9  --  6.2  NEUTROABS 4.6  --   --   HGB 14.8 14.6 13.1  HCT 44.8 43.0 40.4  MCV 89.6  --  90.8  PLT 381  --  993   Basic Metabolic Panel: Recent Labs  Lab 03/13/20 1436 03/13/20 1511 03/15/20 0355  NA 138 137 141  K 4.1 3.8 3.4*  CL 104 105 110  CO2 16*  --  20*  GLUCOSE 98 96 111*  BUN 24* 24* 11  CREATININE 1.07* 0.90 0.76  CALCIUM 9.9  --  8.7*   GFR: Estimated Creatinine  Clearance: 80.2 mL/min (by C-G formula based on SCr of 0.76 mg/dL). Liver Function Tests: Recent Labs  Lab 03/13/20 1436  AST 33  ALT 28  ALKPHOS 66  BILITOT 0.7  PROT 7.1  ALBUMIN 4.3   No results for input(s): LIPASE, AMYLASE in the last 168 hours. No results for input(s): AMMONIA in the last 168 hours. Coagulation Profile: Recent Labs  Lab 03/13/20 1436  INR 1.0   Cardiac Enzymes: No results for input(s): CKTOTAL, CKMB, CKMBINDEX, TROPONINI in the last 168 hours. BNP (last 3 results) No results for input(s): PROBNP in the last 8760 hours. HbA1C: Recent Labs    03/13/20 1913 03/14/20 0333  HGBA1C 5.9* 5.9*   CBG: Recent Labs  Lab 03/14/20 1940 03/14/20 2338 03/15/20 0423 03/15/20 0826 03/15/20 1150  GLUCAP 107* 92 112* 109* 111*   Lipid Profile: Recent Labs    03/14/20 0333  CHOL 129  HDL 31*  LDLCALC 61  TRIG 185*  CHOLHDL 4.2   Thyroid Function Tests: Recent Labs    03/13/20 2246  TSH 4.791*  FREET4 0.78   Anemia Panel: No results for input(s): VITAMINB12, FOLATE, FERRITIN, TIBC, IRON, RETICCTPCT in the last 72 hours. Sepsis Labs: No results for input(s): PROCALCITON, LATICACIDVEN in the last 168 hours.  Recent Results (from the past 240 hour(s))  SARS CORONAVIRUS 2 (TAT 6-24 HRS) Nasopharyngeal Nasopharyngeal Swab     Status: None   Collection Time: 03/13/20  5:25 PM   Specimen: Nasopharyngeal Swab  Result Value Ref Range Status   SARS Coronavirus 2 NEGATIVE NEGATIVE Final    Comment: (NOTE) SARS-CoV-2 target nucleic acids are NOT DETECTED. The SARS-CoV-2 RNA is generally detectable in upper and lower respiratory specimens during the acute phase of infection. Negative results do not preclude SARS-CoV-2 infection, do not rule out co-infections with other pathogens, and should not be used as the sole basis for treatment or other patient management decisions. Negative results must be combined with clinical observations, patient history,  and epidemiological information. The expected result is Negative. Fact Sheet for Patients: SugarRoll.be Fact Sheet for Healthcare Providers: https://www.woods-mathews.com/ This test is not yet approved or cleared by the [place] FDA and  has been authorized for detection and/or diagnosis of SARS-CoV-2 by FDA under an Emergency Use Authorization (EUA). This EUA will remain  in effect (meaning this test can be used) for the duration of the COVID-19 declaration under Section 56 4(b)(1) of the Act, 21 U.S.C. section 360bbb-3(b)(1), unless the authorization is terminated or revoked sooner. Performed at [facility], [place] [address] [place], [place] [address]          Radiology Studies: EEG  Result Date: 03/14/2020 Lora Havens, MD     03/14/2020  2:22 PM Patient Name: PARNEET GLANTZ MRN: [id number] Epilepsy Attending: Lora Havens Referring Physician/Provider: Dr Velta Addison  Mikhail Date: 03/14/2020 Duration: 26.15 minutes Patient history: 74 year old female with medical history of left parafalcine subdural hematoma presented for slurred speech and confusion.  EEG to evaluate for seizures. Level of alertness: Awake AEDs during EEG study: [place] Technical aspects: This EEG study was done with scalp electrodes positioned according to the 10-20 International system of electrode placement. Electrical activity was acquired at a sampling rate of 500Hz  and reviewed with a high frequency filter of 70Hz  and a low frequency filter of 1Hz . EEG data were recorded continuously and digitally stored. Description: The posterior dominant rhythm consists of 9 Hz activity of moderate voltage (25-35 uV) seen predominantly in posterior head regions, symmetric and reactive to eye opening and eye closing.  EEG showed intermittent generalized and maximal left frontotemporal region 2 to 3 Hz delta slowing. Hyperventilation and photic stimulation were not performed.  Abnormality -Intermittent slow, generalized and maximal left frontal region IMPRESSION: This study is suggestive of cortical dysfunction in left frontal region as well as mild diffuse encephalopathy, nonspecific to etiology.  No seizures or epileptiform discharges were seen throughout the recording. Lora Havens   CT HEAD WO CONTRAST  Result Date: 03/13/2020 CLINICAL DATA:  Possible stroke. Aphasia with increased respiratory rate. Fall yesterday. EXAM: CT HEAD WITHOUT CONTRAST CT CERVICAL SPINE WITHOUT CONTRAST TECHNIQUE: Multidetector CT imaging of the head and cervical spine was performed following the standard protocol without intravenous contrast. Multiplanar CT image reconstructions of the cervical spine were also generated. COMPARISON:  Head CT 03/01/2020 and cervical spine CT 02/29/2020 FINDINGS: CT HEAD FINDINGS Brain: There is a small residual subdural hematoma along the left aspect of the falx which has decreased in size and density compared to the prior study and now measures up to 9 mm in thickness (previously 10 mm). There is minimal residual subdural blood layering along the left tentorium. No new intracranial hemorrhage, acute infarct, mass, or midline shift is identified. Mild cerebral atrophy is within normal limits for age. Hypodensities in the cerebral white matter bilaterally are nonspecific but compatible with mild chronic small vessel ischemic disease. Vascular: Calcified atherosclerosis at the skull base. No hyperdense vessel. Skull: No fracture or suspicious osseous lesion. Sinuses/Orbits: Paranasal sinuses and mastoid air cells are clear. Bilateral cataract extraction. Other: None. CT CERVICAL SPINE FINDINGS Alignment: Cervical spine straightening. Trace anterolisthesis of C7 on T1, likely degenerative and facet mediated. Skull base and vertebrae: No acute fracture or suspicious osseous lesion. Soft tissues and spinal canal: No prevertebral fluid or swelling. No visible canal  hematoma. Disc levels: Unchanged disc degeneration including advanced disc space narrowing, degenerative endplate irregularity, and sclerosis at C5-6 and C6-7. Severe right-sided facet arthrosis at C2-3 and C4-5. Moderate to severe neural foraminal stenosis on the left at C3-4 and C5-6 due to uncovertebral spurring. Upper chest: Clear lung apices. Other: Calcified plaque at the left carotid bifurcation. IMPRESSION: 1. No evidence of acute intracranial abnormality. 2. Decreased size of left parafalcine subdural hematoma. 3. No acute cervical spine fracture. Electronically Signed   By: Logan Bores M.D.   On: 03/13/2020 16:39   CT Cervical Spine Wo Contrast  Result Date: 03/13/2020 CLINICAL DATA:  Possible stroke. Aphasia with increased respiratory rate. Fall yesterday. EXAM: CT HEAD WITHOUT CONTRAST CT CERVICAL SPINE WITHOUT CONTRAST TECHNIQUE: Multidetector CT imaging of the head and cervical spine was performed following the standard protocol without intravenous contrast. Multiplanar CT image reconstructions of the cervical spine were also generated. COMPARISON:  Head CT 03/01/2020 and cervical spine CT 02/29/2020 FINDINGS: CT  HEAD FINDINGS Brain: There is a small residual subdural hematoma along the left aspect of the falx which has decreased in size and density compared to the prior study and now measures up to 9 mm in thickness (previously 10 mm). There is minimal residual subdural blood layering along the left tentorium. No new intracranial hemorrhage, acute infarct, mass, or midline shift is identified. Mild cerebral atrophy is within normal limits for age. Hypodensities in the cerebral white matter bilaterally are nonspecific but compatible with mild chronic small vessel ischemic disease. Vascular: Calcified atherosclerosis at the skull base. No hyperdense vessel. Skull: No fracture or suspicious osseous lesion. Sinuses/Orbits: Paranasal sinuses and mastoid air cells are clear. Bilateral cataract  extraction. Other: None. CT CERVICAL SPINE FINDINGS Alignment: Cervical spine straightening. Trace anterolisthesis of C7 on T1, likely degenerative and facet mediated. Skull base and vertebrae: No acute fracture or suspicious osseous lesion. Soft tissues and spinal canal: No prevertebral fluid or swelling. No visible canal hematoma. Disc levels: Unchanged disc degeneration including advanced disc space narrowing, degenerative endplate irregularity, and sclerosis at C5-6 and C6-7. Severe right-sided facet arthrosis at C2-3 and C4-5. Moderate to severe neural foraminal stenosis on the left at C3-4 and C5-6 due to uncovertebral spurring. Upper chest: Clear lung apices. Other: Calcified plaque at the left carotid bifurcation. IMPRESSION: 1. No evidence of acute intracranial abnormality. 2. Decreased size of left parafalcine subdural hematoma. 3. No acute cervical spine fracture. Electronically Signed   By: Logan Bores M.D.   On: 03/13/2020 16:39   MR BRAIN WO CONTRAST  Result Date: 03/14/2020 CLINICAL DATA:  Subdural hematoma 2 weeks ago with recent change in mental status. EXAM: MRI HEAD WITHOUT CONTRAST TECHNIQUE: Multiplanar, multiecho pulse sequences of the brain and surrounding structures were obtained without intravenous contrast. COMPARISON:  CT head without contrast 03/13/2020, 03/01/2020, and 02/29/2020. FINDINGS: Brain: The left para falcine subacute subdural hematoma is again noted. Remote lacunar infarcts are present within the internal capsule bilaterally. No acute infarct is present. Scattered subcortical T2 hyperintensities bilaterally are mildly advanced for age. The ventricles are of normal size. No significant extraaxial fluid collection is present. The internal auditory canals are within normal limits. The brainstem and cerebellum are within normal limits. Vascular: Flow is present in the major intracranial arteries. Skull and upper cervical spine: The craniocervical junction is normal. Upper  cervical spine is within normal limits. Marrow signal is unremarkable. Sinuses/Orbits: The paranasal sinuses and mastoid air cells are clear. Bilateral lens replacements are noted. Globes and orbits are otherwise unremarkable. IMPRESSION: 1. Stable left para falcine subacute subdural hematoma. 2. No acute intracranial abnormality. 3. Scattered subcortical T2 hyperintensities bilaterally are mildly advanced for age. The finding is nonspecific but can be seen in the setting of chronic microvascular ischemia, a demyelinating process such as multiple sclerosis, vasculitis, complicated migraine headaches, or as the sequelae of a prior infectious or inflammatory process. Electronically Signed   By: San Morelle M.D.   On: 03/14/2020 11:42   DG Chest Portable 1 View  Result Date: 03/13/2020 CLINICAL DATA:  Altered mental status.  Diabetes.  Hyperlipidemia. EXAM: PORTABLE CHEST 1 VIEW COMPARISON:  02/29/2020 FINDINGS: Midline trachea. Borderline cardiomegaly. Atherosclerosis in the transverse aorta. No pleural effusion or pneumothorax. No congestive failure. Low lung volumes with resultant pulmonary interstitial prominence. No lobar consolidation. IMPRESSION: Low lung volumes, without acute disease. Aortic Atherosclerosis (ICD10-I70.0). Electronically Signed   By: Abigail Miyamoto M.D.   On: 03/13/2020 18:25        Scheduled Meds: .  atorvastatin  80 mg Oral Daily  . fluticasone  2 spray Each Nare Daily  . insulin aspart  0-9 Units Subcutaneous Q4H  . levETIRAcetam  500 mg Oral BID  . loratadine  10 mg Oral Daily  . mirabegron ER  50 mg Oral QHS  . mometasone-formoterol  2 puff Inhalation BID  . multivitamin with minerals  1 tablet Oral Daily  . thyroid  30 mg Oral Daily   Continuous Infusions: . sodium chloride 50 mL/hr at 03/15/20 1438     LOS: 0 days        Hosie Poisson, MD [facility]   To contact the attending provider between 7A-7P or the covering provider during after  hours 7P-7A, please log into the web site [email or web address] and access using universal [place] password for that web site. If you do not have the password, please call the hospital operator.  03/15/2020, 3:36 PM

## 2020-03-15 NOTE — TOC Initial Note (Signed)
Transition of Care [facility]) - Initial/Assessment Note    Patient Details  Name: Alexandra Wolfe MRN: [id number] Date of Birth: Nov 08, 1946  Transition of Care [facility]) CM/SW Contact:    Geralynn Ochs, LCSW Phone Number:  03/15/2020, 3:18 PM  Clinical Narrative:     CSW spoke with patient's son, Konrad Dolores, about recommendation for SNF placement. [name] in agreement, preference for [facility]. CSW reached out to [facility], and they can accept patient. CSW initiated insurance authorization request. CSW to follow.             Expected Discharge Plan: Skilled Nursing Facility Barriers to Discharge: Continued Medical Work up, Ship broker   Patient Goals and CMS Choice Patient states their goals for this hospitalization and ongoing recovery are:: patient unable to participate in goal setting due to disorientation CMS Medicare.gov Compare Post Acute Care list provided to:: Patient Represenative (must comment) Choice offered to / list presented to : Adult Children  Expected Discharge Plan and Services Expected Discharge Plan: [place] Acute Care Choice: [place] arrangements for the past 2 months: Single Family Home                                      Prior Living Arrangements/Services Living arrangements for the past 2 months: Single Family Home Lives with:: Adult Children Patient language and need for interpreter reviewed:: No Do you feel safe going back to the place where you live?: Yes      Need for Family Participation in Patient Care: Yes (Comment) Care giver support system in place?: No (comment)   Criminal Activity/Legal Involvement Pertinent to Current Situation/Hospitalization: No - Comment as needed  Activities of Daily Living Home Assistive Devices/Equipment: Cane (specify quad or straight) ADL Screening (condition at time of admission) Patient's cognitive ability adequate to safely complete daily  activities?: Yes Is the patient deaf or have difficulty hearing?: No Does the patient have difficulty seeing, even when wearing glasses/contacts?: No Does the patient have difficulty concentrating, remembering, or making decisions?: No Patient able to express need for assistance with ADLs?: Yes Does the patient have difficulty dressing or bathing?: No Independently performs ADLs?: Yes (appropriate for developmental age) Does the patient have difficulty walking or climbing stairs?: Yes Weakness of Legs: Left Weakness of Arms/Hands: Both  Permission Sought/Granted Permission sought to share information with : Facility Sport and exercise psychologist, Family Supports Permission granted to share information with : Yes, Verbal Permission Granted  Share Information with NAME: Konrad Dolores  Permission granted to share info w AGENCY: [facility]  Permission granted to share info w Relationship: Son     Emotional Assessment   Attitude/Demeanor/Rapport: Unable to Assess Affect (typically observed): Unable to Assess Orientation: : Oriented to Self, Oriented to Place Alcohol / Substance Use: Not Applicable Psych Involvement: No (comment)  Admission diagnosis:  Acute encephalopathy [G93.40] Patient Active Problem List   Diagnosis Date Noted   Acute encephalopathy 03/13/2020   Acute subdural hematoma (HCC) 02/29/2020   Syncope 02/29/2020   Mild renal insufficiency 02/29/2020   Essential hypertension 02/29/2020   Asthma 02/29/2020   Chronic dyspnea 01/04/2020   Allergic rhinitis 01/04/2020   External otitis of right ear 07/25/2019   Bilateral foot pain 07/01/2019   Diabetic neuropathy ([place]) 03/04/2018   Obstructive sleep apnea 04/04/2016   Peripheral edema 12/23/2015   Hypersomnolence 12/22/2015   Baker's cyst of knee 11/24/2013  Left knee pain 11/24/2013   Dyspnea 11/24/2013   Rash 05/26/2012   Bilateral shoulder pain 12/13/2011   Diabetes mellitus type II, non insulin dependent ([place]) 11/20/2011    Leukocytosis 11/20/2011   Preoperative clearance 11/20/2011   Eustachian tube dysfunction 05/20/2011   LBP (low back pain) 05/20/2011   Encounter for well adult exam with abnormal findings 05/20/2011   Vertigo 05/20/2011   Hypothyroidism 02/15/2011   Other dysphagia 02/15/2011   URINARY INCONTINENCE 08/23/2009   Seasonal and perennial allergic rhinitis 04/02/2009   CONSTIPATION 09/27/2008   Hyperlipidemia 03/11/2008   BUNIONS, BILATERAL 12/23/2007   Depression 09/13/2007   OSTEOARTHROSIS NOS, LOWER LEG 09/13/2007   COLONIC POLYPS, HX OF 09/13/2007   PCP:  Biagio Borg, MD Pharmacy:   [facility] [place], [address] [place] [address] Phone: [phone] Fax: [phone]  [id number] - [place], [place] [address] [place] [address] Phone: [phone] Fax: [phone]     Social Determinants of Health ([place]) Interventions    Readmission Risk Interventions No flowsheet data found.

## 2020-03-15 NOTE — Progress Notes (Signed)
  Speech Language Pathology Treatment: Cognitive-Linquistic  Patient Details Name: Alexandra Wolfe MRN: [id number] DOB: Feb 23, 1946 Today's Date: 03/15/2020 Time: [id number] SLP Time Calculation (min) (ACUTE ONLY): 19 min  Assessment / Plan / Recommendation Clinical Impression  Western Aphasia Battery completed today with pt scoring 9/10 on fluency, 8/10 on auditory comprehension and 10/10 on repetition indicating symptoms consistent with anomic aphasia.  She is demonstrating imrpoved fluency today compared to yesterday with improved orientation.  Pt benefits from cues to examine details in pictures and to expand on information provided.    She appears with a flat affect, but uncertain to her baseline prosody/pragmatics.  Decreased attention and memory noted during session thus cognitive goals will be added to her care plan to maximize her participation in her health care and decrease caregiver burden.  Pt agreeable to follow up SLP.    HPI HPI: pt is a 74 yo female adm to [facility] with slurred speech, aphasia and fall day prior to admit.  PMH + for SDH, anxiety, DM, prior smoker.  Pt CXR negative x low lung volumes.  Pt imaging study showed decrease in size of SDH.      SLP Plan  Continue with current plan of care       Recommendations      SNF             Follow up Recommendations: Skilled Nursing facility SLP Visit Diagnosis: Cognitive communication deficit [id number] Plan: Continue with current plan of care       GO               Alexandra Lime, MS [facility] SLP Acute Rehab Services Office [phone]  Macario Golds 03/15/2020, 9:57 AM

## 2020-03-15 NOTE — NC FL2 (Signed)
[place] LEVEL OF CARE SCREENING TOOL     IDENTIFICATION  Patient Name: Alexandra Wolfe Birthdate: 01-Apr-1946 Sex: female Admission Date (Current Location): 03/13/2020  [facility] and [place] Number:  Herbalist and Address:  [facility] [place], [place] [address] [place], [place] [address]      Provider Number: [id number]  Attending Physician Name and Address:  Hosie Poisson, MD  Relative Name and Phone Number:       Current Level of Care: Hospital Recommended Level of Care: [place] Prior Approval Number:    Date Approved/Denied:   PASRR Number: Pending  Discharge Plan: SNF    Current Diagnoses: Patient Active Problem List   Diagnosis Date Noted  . Acute encephalopathy 03/13/2020  . Acute subdural hematoma ([place]) 02/29/2020  . Syncope 02/29/2020  . Mild renal insufficiency 02/29/2020  . Essential hypertension 02/29/2020  . Asthma 02/29/2020  . Chronic dyspnea 01/04/2020  . Allergic rhinitis 01/04/2020  . External otitis of right ear 07/25/2019  . Bilateral foot pain 07/01/2019  . Diabetic neuropathy ([place]) 03/04/2018  . Obstructive sleep apnea 04/04/2016  . Peripheral edema 12/23/2015  . Hypersomnolence 12/22/2015  . Baker's cyst of knee 11/24/2013  . Left knee pain 11/24/2013  . Dyspnea 11/24/2013  . Rash 05/26/2012  . Bilateral shoulder pain 12/13/2011  . Diabetes mellitus type II, non insulin dependent ([place]) 11/20/2011  . Leukocytosis 11/20/2011  . Preoperative clearance 11/20/2011  . Eustachian tube dysfunction 05/20/2011  . LBP (low back pain) 05/20/2011  . Encounter for well adult exam with abnormal findings 05/20/2011  . Vertigo 05/20/2011  . Hypothyroidism 02/15/2011  . Other dysphagia 02/15/2011  . URINARY INCONTINENCE 08/23/2009  . Seasonal and perennial allergic rhinitis 04/02/2009  . CONSTIPATION 09/27/2008  . Hyperlipidemia 03/11/2008  . BUNIONS, BILATERAL 12/23/2007  . Depression  09/13/2007  . OSTEOARTHROSIS NOS, LOWER LEG 09/13/2007  . COLONIC POLYPS, HX OF 09/13/2007    Orientation RESPIRATION BLADDER Height & Weight     Self, Place  Normal Incontinent Weight: 242 lb 8.1 oz (110 kg) Height:  5\' 8"  (172.7 cm)  BEHAVIORAL SYMPTOMS/MOOD NEUROLOGICAL BOWEL NUTRITION STATUS      Continent Diet(see DC summary)  AMBULATORY STATUS COMMUNICATION OF NEEDS Skin   Limited Assist Verbally Normal                       Personal Care Assistance Level of Assistance  Bathing, Feeding, Dressing Bathing Assistance: Limited assistance Feeding assistance: Independent Dressing Assistance: Limited assistance     Functional Limitations Info  Speech     Speech Info: Impaired(expressive aphasia)    SPECIAL CARE FACTORS FREQUENCY  PT (By licensed PT), OT (By licensed OT), Speech therapy     PT Frequency: 5x/wk OT Frequency: 5x/wk     Speech Therapy Frequency: 5x/wk      Contractures Contractures Info: Not present    Additional Factors Info  Code Status, Allergies, Insulin Sliding Scale Code Status Info: Full Allergies Info: Aripiprazole, Simvastatin   Insulin Sliding Scale Info: 0-9 units every 4 hours       Current Medications (03/15/2020):  This is the current hospital active medication list Current Facility-Administered Medications  Medication Dose Route Frequency Provider Last Rate Last Admin  . 0.9 %  sodium chloride infusion   Intravenous Continuous Cristal Ford, DO 50 mL/hr at 03/14/20 1828 New Bag at 03/14/20 1828  . acetaminophen (TYLENOL) tablet 650 mg  650 mg Oral Q4H PRN  Cristal Ford, DO   650 mg at 03/14/20 1046   Or  . acetaminophen (TYLENOL) 160 MG/5ML solution 650 mg  650 mg Per Tube Q4H PRN Cristal Ford, DO       Or  . acetaminophen (TYLENOL) suppository 650 mg  650 mg Rectal Q4H PRN Mikhail, Velta Addison, DO      . albuterol (PROVENTIL) (2.5 MG/3ML) 0.083% nebulizer solution 3 mL  3 mL Inhalation Q6H PRN Cristal Ford, DO       . atorvastatin (LIPITOR) tablet 80 mg  80 mg Oral Daily Cristal Ford, DO   80 mg at 03/15/20 1048  . fluticasone (FLONASE) 50 MCG/ACT nasal spray 2 spray  2 spray Each Nare Daily Cristal Ford, DO   2 spray at 03/15/20 1047  . hydrOXYzine (ATARAX/VISTARIL) tablet 25 mg  25 mg Oral TID PRN Marliss Coots, PA-C      . insulin aspart (novoLOG) injection 0-9 Units  0-9 Units Subcutaneous Q4H Cristal Ford, DO   1 Units at 03/14/20 1313  . levETIRAcetam (KEPPRA) tablet 500 mg  500 mg Oral BID Cristal Ford, DO   500 mg at 03/15/20 1048  . loratadine (CLARITIN) tablet 10 mg  10 mg Oral Daily Cristal Ford, DO   10 mg at 03/15/20 1048  . mirabegron ER (MYRBETRIQ) tablet 50 mg  50 mg Oral QHS Mikhail, Velta Addison, DO   50 mg at 03/14/20 2212  . mometasone-formoterol (DULERA) 200-5 MCG/ACT inhaler 2 puff  2 puff Inhalation BID Cristal Ford, DO   2 puff at 03/15/20 0854  . multivitamin with minerals tablet 1 tablet  1 tablet Oral Daily Cristal Ford, DO   1 tablet at 03/15/20 1048  . senna-docusate (Senokot-S) tablet 1 tablet  1 tablet Oral QHS PRN Cristal Ford, DO      . thyroid (ARMOUR) tablet 30 mg  30 mg Oral Daily Cristal Ford, DO   30 mg at 03/15/20 1048     Discharge Medications: Please see discharge summary for a list of discharge medications.  Relevant Imaging Results:  Relevant Lab Results:   Additional Information SS# [id number]  Alexandra Ochs, LCSW

## 2020-03-16 DIAGNOSIS — E669 Obesity, unspecified: Secondary | ICD-10-CM | POA: Diagnosis not present

## 2020-03-16 DIAGNOSIS — S0101XD Laceration without foreign body of scalp, subsequent encounter: Secondary | ICD-10-CM | POA: Diagnosis not present

## 2020-03-16 DIAGNOSIS — R1310 Dysphagia, unspecified: Secondary | ICD-10-CM | POA: Diagnosis not present

## 2020-03-16 DIAGNOSIS — R41841 Cognitive communication deficit: Secondary | ICD-10-CM | POA: Diagnosis not present

## 2020-03-16 DIAGNOSIS — Z20822 Contact with and (suspected) exposure to covid-19: Secondary | ICD-10-CM | POA: Diagnosis not present

## 2020-03-16 DIAGNOSIS — E785 Hyperlipidemia, unspecified: Secondary | ICD-10-CM | POA: Diagnosis not present

## 2020-03-16 DIAGNOSIS — S065X9A Traumatic subdural hemorrhage with loss of consciousness of unspecified duration, initial encounter: Secondary | ICD-10-CM | POA: Diagnosis not present

## 2020-03-16 DIAGNOSIS — M19011 Primary osteoarthritis, right shoulder: Secondary | ICD-10-CM | POA: Diagnosis not present

## 2020-03-16 DIAGNOSIS — S065X9D Traumatic subdural hemorrhage with loss of consciousness of unspecified duration, subsequent encounter: Secondary | ICD-10-CM | POA: Diagnosis not present

## 2020-03-16 DIAGNOSIS — Z7984 Long term (current) use of oral hypoglycemic drugs: Secondary | ICD-10-CM | POA: Diagnosis not present

## 2020-03-16 DIAGNOSIS — Z7982 Long term (current) use of aspirin: Secondary | ICD-10-CM | POA: Diagnosis not present

## 2020-03-16 DIAGNOSIS — G9340 Encephalopathy, unspecified: Secondary | ICD-10-CM | POA: Diagnosis not present

## 2020-03-16 DIAGNOSIS — R5381 Other malaise: Secondary | ICD-10-CM | POA: Diagnosis not present

## 2020-03-16 DIAGNOSIS — M170 Bilateral primary osteoarthritis of knee: Secondary | ICD-10-CM | POA: Diagnosis not present

## 2020-03-16 DIAGNOSIS — Z79899 Other long term (current) drug therapy: Secondary | ICD-10-CM | POA: Diagnosis not present

## 2020-03-16 DIAGNOSIS — I10 Essential (primary) hypertension: Secondary | ICD-10-CM | POA: Diagnosis not present

## 2020-03-16 DIAGNOSIS — M6281 Muscle weakness (generalized): Secondary | ICD-10-CM | POA: Diagnosis not present

## 2020-03-16 DIAGNOSIS — G4733 Obstructive sleep apnea (adult) (pediatric): Secondary | ICD-10-CM | POA: Diagnosis not present

## 2020-03-16 DIAGNOSIS — R42 Dizziness and giddiness: Secondary | ICD-10-CM | POA: Diagnosis not present

## 2020-03-16 DIAGNOSIS — Z9181 History of falling: Secondary | ICD-10-CM | POA: Diagnosis not present

## 2020-03-16 DIAGNOSIS — J45909 Unspecified asthma, uncomplicated: Secondary | ICD-10-CM | POA: Diagnosis not present

## 2020-03-16 DIAGNOSIS — J309 Allergic rhinitis, unspecified: Secondary | ICD-10-CM | POA: Diagnosis not present

## 2020-03-16 DIAGNOSIS — E039 Hypothyroidism, unspecified: Secondary | ICD-10-CM | POA: Diagnosis not present

## 2020-03-16 DIAGNOSIS — D649 Anemia, unspecified: Secondary | ICD-10-CM | POA: Diagnosis not present

## 2020-03-16 DIAGNOSIS — E119 Type 2 diabetes mellitus without complications: Secondary | ICD-10-CM | POA: Diagnosis not present

## 2020-03-16 DIAGNOSIS — E876 Hypokalemia: Secondary | ICD-10-CM | POA: Diagnosis not present

## 2020-03-16 DIAGNOSIS — N3281 Overactive bladder: Secondary | ICD-10-CM | POA: Diagnosis not present

## 2020-03-16 DIAGNOSIS — M2550 Pain in unspecified joint: Secondary | ICD-10-CM | POA: Diagnosis not present

## 2020-03-16 DIAGNOSIS — R4701 Aphasia: Secondary | ICD-10-CM | POA: Diagnosis not present

## 2020-03-16 DIAGNOSIS — S065X0D Traumatic subdural hemorrhage without loss of consciousness, subsequent encounter: Secondary | ICD-10-CM | POA: Diagnosis not present

## 2020-03-16 DIAGNOSIS — E1140 Type 2 diabetes mellitus with diabetic neuropathy, unspecified: Secondary | ICD-10-CM | POA: Diagnosis not present

## 2020-03-16 DIAGNOSIS — Z87891 Personal history of nicotine dependence: Secondary | ICD-10-CM | POA: Diagnosis not present

## 2020-03-16 DIAGNOSIS — E1169 Type 2 diabetes mellitus with other specified complication: Secondary | ICD-10-CM | POA: Diagnosis not present

## 2020-03-16 DIAGNOSIS — F329 Major depressive disorder, single episode, unspecified: Secondary | ICD-10-CM | POA: Diagnosis not present

## 2020-03-16 DIAGNOSIS — E034 Atrophy of thyroid (acquired): Secondary | ICD-10-CM | POA: Diagnosis not present

## 2020-03-16 DIAGNOSIS — R410 Disorientation, unspecified: Secondary | ICD-10-CM | POA: Diagnosis not present

## 2020-03-16 DIAGNOSIS — R55 Syncope and collapse: Secondary | ICD-10-CM | POA: Diagnosis not present

## 2020-03-16 DIAGNOSIS — H8110 Benign paroxysmal vertigo, unspecified ear: Secondary | ICD-10-CM | POA: Diagnosis not present

## 2020-03-16 DIAGNOSIS — G9341 Metabolic encephalopathy: Secondary | ICD-10-CM | POA: Diagnosis not present

## 2020-03-16 DIAGNOSIS — Z7401 Bed confinement status: Secondary | ICD-10-CM | POA: Diagnosis not present

## 2020-03-16 LAB — BASIC METABOLIC PANEL
Anion gap: 12 (ref 5–15)
BUN: 9 mg/dL (ref 8–23)
CO2: 19 mmol/L — ABNORMAL LOW (ref 22–32)
Calcium: 8.9 mg/dL (ref 8.9–10.3)
Chloride: 108 mmol/L (ref 98–111)
Creatinine, Ser: 0.78 mg/dL (ref 0.44–1.00)
GFR calc Af Amer: >60 mL/min (ref >60)
GFR calc non Af Amer: >60 mL/min (ref >60)
Glucose, Bld: 138 mg/dL — ABNORMAL HIGH (ref 70–99)
Potassium: 3.9 mmol/L (ref 3.5–5.1)
Sodium: 139 mmol/L (ref 135–145)

## 2020-03-16 LAB — GLUCOSE, CAPILLARY
Glucose-Capillary: 112 mg/dL — ABNORMAL HIGH (ref 70–99)
Glucose-Capillary: 114 mg/dL — ABNORMAL HIGH (ref 70–99)
Glucose-Capillary: 125 mg/dL — ABNORMAL HIGH (ref 70–99)
Glucose-Capillary: 126 mg/dL — ABNORMAL HIGH (ref 70–99)

## 2020-03-16 MED ORDER — SENNOSIDES-DOCUSATE SODIUM 8.6-50 MG PO TABS: 1.0000 | ORAL_TABLET | Freq: Every evening | ORAL | Status: DC | PRN

## 2020-03-16 NOTE — Discharge Summary (Signed)
Physician Discharge Summary  Alexandra Wolfe TLX:[id number] DOB: 01/04/1946 DOA: 03/13/2020  PCP: Biagio Borg, MD  Admit date: 03/13/2020 Discharge date: 03/16/2020  Admitted From: Home  Disposition:  Home.   Recommendations for Outpatient Follow-up:  1. Follow up with PCP in 1-2 weeks 2. Please obtain BMP/CBC in one week Please follow up with neurology as recommended.    Discharge Condition: stable.  CODE STATUS: FULL CODE.  Diet recommendation: Heart Healthy  Brief/Interim Summary: 74 year old lady prior history of hypertension, type 2 diabetes, hyperlipidemia recent fall with subdural hematoma presents to ED with complaint of altered mental status.  Patient was recently admitted on February 29 2020 and was discharged on March 02 2020 for subdural hematoma and was also found to have vertigo with BPPV.  She was subsequently discharged home with home health services.  Repeat CT of the head is unremarkable and is negative for acute intracranial abnormalities.  Discharge Diagnoses:  Active Problems:   Hyperlipidemia   Depression   Hypothyroidism   Vertigo   Diabetes mellitus type II, non insulin dependent (HCC)   Acute subdural hematoma (HCC)   Essential hypertension   Acute encephalopathy  Acute metabolic encephalopathy Resolved Patient is currently alert and oriented and answering questions appropriately. EEG does not show any seizures or epileptiform discharges. MRI of the brain is negative for any acute stroke.  Neurology consulted and have signed off.  No new recommendations at this time.  Hypokalemia:  Replaced.   Essential hypertension Blood pressure parameters are better controlled today.  Resume metoprolol on discharge.   Type II diabetes mellitus Hold glipizide and Jardiance and resume Metformin on discharge.  Hemoglobin A1c is 5.9.   Hyperlipidemia Continue with statin.   Hypothyroidism Continue with [place]   Asthma Continue with home  medications.    Obesity Body mass index is 36.87 kg/m. -Outpatient follow-up PCP for lifestyle modification and weight loss.   History of BPPV and vertigo PT/OT evaluation center.  TOC on board for SNF placement.   Discharge Instructions  Discharge Instructions    Diet - low sodium heart healthy   Complete by: As directed    Discharge instructions   Complete by: As directed    Please follow up with PCP in one week.  Please follow up with Neurology as recommended.   Increase activity slowly   Complete by: As directed      Allergies as of 03/16/2020      Reactions   Aripiprazole    REACTION: agitation   Simvastatin    REACTION: myalgia      Medication List    STOP taking these medications   aspirin 81 MG tablet   diclofenac 75 MG EC tablet Commonly known as: VOLTAREN   glipiZIDE 2.5 MG 24 hr tablet Commonly known as: GLUCOTROL XL   hydrochlorothiazide 25 MG tablet Commonly known as: HYDRODIURIL   Jardiance 25 MG Tabs tablet Generic drug: empagliflozin   OneTouch Verio w/Device Kit   Vitamin D (Ergocalciferol) 1.25 MG (50000 UNIT) Caps capsule Commonly known as: DRISDOL     TAKE these medications   acetaminophen 325 MG tablet Commonly known as: TYLENOL Take 2 tablets (650 mg total) by mouth every 6 (six) hours as needed for mild pain or headache.   albuterol 108 (90 Base) MCG/ACT inhaler Commonly known as: VENTOLIN HFA Inhale 2 puffs into the lungs every 6 (six) hours as needed for wheezing or shortness of breath.   atorvastatin 80 MG tablet Commonly known as: LIPITOR  Take 1 tablet (80 mg total) by mouth daily.   budesonide-formoterol 160-4.5 MCG/ACT inhaler Commonly known as: Symbicort Inhale 2 puffs into the lungs 2 (two) times daily.   CALCIUM 500 PO Take 2,000 mg by mouth daily.   cetirizine 10 MG tablet Commonly known as: ZYRTEC Take 1 tablet (10 mg total) by mouth daily.   fluticasone 50 MCG/ACT nasal spray Commonly known as:  FLONASE SHAKE LIQUID AND USE 2 SPRAYS IN EACH NOSTRIL DAILY FOR ALLERGIES What changed: See the new instructions.   levETIRAcetam 500 MG tablet Commonly known as: Keppra Take 1 tablet (500 mg total) by mouth 2 (two) times daily.   meclizine 25 MG tablet Commonly known as: Antivert Take 1 tablet (25 mg total) by mouth 3 (three) times daily as needed for dizziness.   metFORMIN 500 MG 24 hr tablet Commonly known as: GLUCOPHAGE-XR TAKE 4 TABLETS BY MOUTH EVERY MORNING What changed: See the new instructions.   metoprolol succinate 25 MG 24 hr tablet Commonly known as: TOPROL-XL TAKE 1 TABLET(25 MG) BY MOUTH DAILY What changed:   how much to take  how to take this  when to take this  additional instructions   multivitamin with minerals Tabs tablet Take 1 tablet by mouth daily.   Myrbetriq 50 MG Tb24 tablet Generic drug: mirabegron ER Take 50 mg by mouth at bedtime.   NP Thyroid 30 MG tablet Generic drug: thyroid Take 30 mg by mouth daily.   ondansetron 4 MG tablet Commonly known as: ZOFRAN Take 4 mg by mouth every 8 (eight) hours as needed for nausea or vomiting.   senna-docusate 8.6-50 MG tablet Commonly known as: Senokot-S Take 1 tablet by mouth at bedtime as needed for mild constipation.   traZODone 50 MG tablet Commonly known as: DESYREL TAKE 1 TABLET(50 MG) BY MOUTH AT BEDTIME What changed:   how much to take  how to take this  when to take this  additional instructions       Allergies  Allergen Reactions  . Aripiprazole     REACTION: agitation  . Simvastatin     REACTION: myalgia    Consultations:  Neurology.    Procedures/Studies: EEG  Result Date: 03/14/2020 Lora Havens, MD     03/14/2020  2:22 PM Patient Name: Alexandra Wolfe MRN: [id number] Epilepsy Attending: Lora Havens Referring Physician/Provider: Dr Cristal Ford Date: 03/14/2020 Duration: 26.15 minutes Patient history: 74 year old female with medical history of left  parafalcine subdural hematoma presented for slurred speech and confusion.  EEG to evaluate for seizures. Level of alertness: Awake AEDs during EEG study: Keppra Technical aspects: This EEG study was done with scalp electrodes positioned according to the 10-20 International system of electrode placement. Electrical activity was acquired at a sampling rate of 500Hz and reviewed with a high frequency filter of 70Hz and a low frequency filter of 1Hz. EEG data were recorded continuously and digitally stored. Description: The posterior dominant rhythm consists of 9 Hz activity of moderate voltage (25-35 uV) seen predominantly in posterior head regions, symmetric and reactive to eye opening and eye closing.  EEG showed intermittent generalized and maximal left frontotemporal region 2 to 3 Hz delta slowing. Hyperventilation and photic stimulation were not performed. Abnormality -Intermittent slow, generalized and maximal left frontal region IMPRESSION: This study is suggestive of cortical dysfunction in left frontal region as well as mild diffuse encephalopathy, nonspecific to etiology.  No seizures or epileptiform discharges were seen throughout the recording. Priyanka Barbra Sarks  DG Knee 1-2 Views Left  Result Date: 03/02/2020 CLINICAL DATA:  Fall EXAM: LEFT KNEE - 1-2 VIEW COMPARISON:  None. FINDINGS: Chondrocalcinosis with tricompartment degenerative changes. No joint effusion. No acute bony abnormality. Specifically, no fracture, subluxation, or dislocation. IMPRESSION: Degenerative changes.  No acute bony abnormality. Electronically Signed   By: Rolm Baptise M.D.   On: 03/02/2020 02:55   DG Knee 1-2 Views Right  Result Date: 03/02/2020 CLINICAL DATA:  Fall EXAM: RIGHT KNEE - 1-2 VIEW COMPARISON:  None. FINDINGS: Chondrocalcinosis with joint space narrowing and spurring in all 3 compartments. No acute bony abnormality. Specifically, no fracture, subluxation, or dislocation. No joint effusion. IMPRESSION: Mild  degenerative changes.  No acute bony abnormality. Electronically Signed   By: Rolm Baptise M.D.   On: 03/02/2020 02:55   CT HEAD WO CONTRAST  Result Date: 03/13/2020 CLINICAL DATA:  Possible stroke. Aphasia with increased respiratory rate. Fall yesterday. EXAM: CT HEAD WITHOUT CONTRAST CT CERVICAL SPINE WITHOUT CONTRAST TECHNIQUE: Multidetector CT imaging of the head and cervical spine was performed following the standard protocol without intravenous contrast. Multiplanar CT image reconstructions of the cervical spine were also generated. COMPARISON:  Head CT 03/01/2020 and cervical spine CT 02/29/2020 FINDINGS: CT HEAD FINDINGS Brain: There is a small residual subdural hematoma along the left aspect of the falx which has decreased in size and density compared to the prior study and now measures up to 9 mm in thickness (previously 10 mm). There is minimal residual subdural blood layering along the left tentorium. No new intracranial hemorrhage, acute infarct, mass, or midline shift is identified. Mild cerebral atrophy is within normal limits for age. Hypodensities in the cerebral white matter bilaterally are nonspecific but compatible with mild chronic small vessel ischemic disease. Vascular: Calcified atherosclerosis at the skull base. No hyperdense vessel. Skull: No fracture or suspicious osseous lesion. Sinuses/Orbits: Paranasal sinuses and mastoid air cells are clear. Bilateral cataract extraction. Other: None. CT CERVICAL SPINE FINDINGS Alignment: Cervical spine straightening. Trace anterolisthesis of C7 on T1, likely degenerative and facet mediated. Skull base and vertebrae: No acute fracture or suspicious osseous lesion. Soft tissues and spinal canal: No prevertebral fluid or swelling. No visible canal hematoma. Disc levels: Unchanged disc degeneration including advanced disc space narrowing, degenerative endplate irregularity, and sclerosis at C5-6 and C6-7. Severe right-sided facet arthrosis at C2-3 and  C4-5. Moderate to severe neural foraminal stenosis on the left at C3-4 and C5-6 due to uncovertebral spurring. Upper chest: Clear lung apices. Other: Calcified plaque at the left carotid bifurcation. IMPRESSION: 1. No evidence of acute intracranial abnormality. 2. Decreased size of left parafalcine subdural hematoma. 3. No acute cervical spine fracture. Electronically Signed   By: Logan Bores M.D.   On: 03/13/2020 16:39   CT HEAD WO CONTRAST  Result Date: 03/01/2020 CLINICAL DATA:  Head trauma, intracranial venous injury suspected. Additional history provided: Patient admitted yesterday status post fall, CT showed small falcine subdural hematoma, patient very dizzy today, reports occipital headache EXAM: CT HEAD WITHOUT CONTRAST TECHNIQUE: Contiguous axial images were obtained from the base of the skull through the vertex without intravenous contrast. COMPARISON:  Head CT 02/29/2020 FINDINGS: Brain: An acute subdural hemorrhage layering along the left aspect of the falx and left tentorium is unchanged as compared to head CT 02/29/2020. The hemorrhage again measures up to 10 mm in greatest thickness (series 3, image 24). No interval hemorrhage is demonstrated. No evidence of intracranial mass. No midline shift. No hydrocephalus. Vascular: No hyperdense vessel.  Atherosclerotic  calcifications. Skull: Normal. Negative for fracture or focal lesion. Sinuses/Orbits: Visualized orbits demonstrate no acute abnormality. Trace ethmoid sinus mucosal thickening. No significant mastoid effusion. Other: Redemonstrated left parietal scalp hematoma. There is also a small amount of subcutaneous gas at this site suggestive of laceration. IMPRESSION: Stable acute subdural hemorrhage layering along the left aspect of the falx and left tentorium, again measuring up to 10 mm in thickness. No significant mass effect. Redemonstrated left posterior scalp hematoma/laceration. Electronically Signed   By: Kellie Simmering DO   On: 03/01/2020  08:59   CT HEAD WO CONTRAST  Result Date: 02/29/2020 CLINICAL DATA:  Syncopal episode, hit head EXAM: CT HEAD WITHOUT CONTRAST TECHNIQUE: Contiguous axial images were obtained from the base of the skull through the vertex without intravenous contrast. COMPARISON:  None. FINDINGS: Brain: Acute subdural hematoma is present along the left aspect of the falx measuring up to 10 mm in thickness. There is no significant associated mass effect. Prominence of the ventricles and sulci reflects minor generalized parenchymal volume loss. Gray-white differentiation is preserved. Vascular: No hyperdense vessel or unexpected calcification. Skull: Calvarium is unremarkable. Sinuses/Orbits: No acute finding. Other: Posterior scalp soft tissue swelling. IMPRESSION: Acute left falcine subdural hematoma.  No significant mass effect. These results were called by telephone at the time of interpretation on 02/29/2020 at 5:01 pm to provider Veryl Speak , who verbally acknowledged these results. Electronically Signed   By: Macy Mis M.D.   On: 02/29/2020 17:04   CT Cervical Spine Wo Contrast  Result Date: 03/13/2020 CLINICAL DATA:  Possible stroke. Aphasia with increased respiratory rate. Fall yesterday. EXAM: CT HEAD WITHOUT CONTRAST CT CERVICAL SPINE WITHOUT CONTRAST TECHNIQUE: Multidetector CT imaging of the head and cervical spine was performed following the standard protocol without intravenous contrast. Multiplanar CT image reconstructions of the cervical spine were also generated. COMPARISON:  Head CT 03/01/2020 and cervical spine CT 02/29/2020 FINDINGS: CT HEAD FINDINGS Brain: There is a small residual subdural hematoma along the left aspect of the falx which has decreased in size and density compared to the prior study and now measures up to 9 mm in thickness (previously 10 mm). There is minimal residual subdural blood layering along the left tentorium. No new intracranial hemorrhage, acute infarct, mass, or midline  shift is identified. Mild cerebral atrophy is within normal limits for age. Hypodensities in the cerebral white matter bilaterally are nonspecific but compatible with mild chronic small vessel ischemic disease. Vascular: Calcified atherosclerosis at the skull base. No hyperdense vessel. Skull: No fracture or suspicious osseous lesion. Sinuses/Orbits: Paranasal sinuses and mastoid air cells are clear. Bilateral cataract extraction. Other: None. CT CERVICAL SPINE FINDINGS Alignment: Cervical spine straightening. Trace anterolisthesis of C7 on T1, likely degenerative and facet mediated. Skull base and vertebrae: No acute fracture or suspicious osseous lesion. Soft tissues and spinal canal: No prevertebral fluid or swelling. No visible canal hematoma. Disc levels: Unchanged disc degeneration including advanced disc space narrowing, degenerative endplate irregularity, and sclerosis at C5-6 and C6-7. Severe right-sided facet arthrosis at C2-3 and C4-5. Moderate to severe neural foraminal stenosis on the left at C3-4 and C5-6 due to uncovertebral spurring. Upper chest: Clear lung apices. Other: Calcified plaque at the left carotid bifurcation. IMPRESSION: 1. No evidence of acute intracranial abnormality. 2. Decreased size of left parafalcine subdural hematoma. 3. No acute cervical spine fracture. Electronically Signed   By: Logan Bores M.D.   On: 03/13/2020 16:39   CT Cervical Spine Wo Contrast  Result Date: 02/29/2020 CLINICAL  DATA:  Syncope EXAM: CT CERVICAL SPINE WITHOUT CONTRAST TECHNIQUE: Multidetector CT imaging of the cervical spine was performed without intravenous contrast. Multiplanar CT image reconstructions were also generated. COMPARISON:  None. FINDINGS: Alignment: No significant listhesis. Skull base and vertebrae: No acute cervical spine fracture. Vertebral body heights are maintained apart from degenerative endplate irregularity. Soft tissues and spinal canal: No prevertebral fluid or swelling. No  visible canal hematoma. Disc levels: Multilevel degenerative changes are present including disc space narrowing, endplate osteophytes, and facet and uncovertebral hypertrophy. Upper chest: Negative. Other: Mild calcified plaque at the left ICA origin. IMPRESSION: No acute cervical spine fracture. Electronically Signed   By: Macy Mis M.D.   On: 02/29/2020 17:07   MR BRAIN WO CONTRAST  Result Date: 03/14/2020 CLINICAL DATA:  Subdural hematoma 2 weeks ago with recent change in mental status. EXAM: MRI HEAD WITHOUT CONTRAST TECHNIQUE: Multiplanar, multiecho pulse sequences of the brain and surrounding structures were obtained without intravenous contrast. COMPARISON:  CT head without contrast 03/13/2020, 03/01/2020, and 02/29/2020. FINDINGS: Brain: The left para falcine subacute subdural hematoma is again noted. Remote lacunar infarcts are present within the internal capsule bilaterally. No acute infarct is present. Scattered subcortical T2 hyperintensities bilaterally are mildly advanced for age. The ventricles are of normal size. No significant extraaxial fluid collection is present. The internal auditory canals are within normal limits. The brainstem and cerebellum are within normal limits. Vascular: Flow is present in the major intracranial arteries. Skull and upper cervical spine: The craniocervical junction is normal. Upper cervical spine is within normal limits. Marrow signal is unremarkable. Sinuses/Orbits: The paranasal sinuses and mastoid air cells are clear. Bilateral lens replacements are noted. Globes and orbits are otherwise unremarkable. IMPRESSION: 1. Stable left para falcine subacute subdural hematoma. 2. No acute intracranial abnormality. 3. Scattered subcortical T2 hyperintensities bilaterally are mildly advanced for age. The finding is nonspecific but can be seen in the setting of chronic microvascular ischemia, a demyelinating process such as multiple sclerosis, vasculitis, complicated  migraine headaches, or as the sequelae of a prior infectious or inflammatory process. Electronically Signed   By: San Morelle M.D.   On: 03/14/2020 11:42   DG Chest Portable 1 View  Result Date: 03/13/2020 CLINICAL DATA:  Altered mental status.  Diabetes.  Hyperlipidemia. EXAM: PORTABLE CHEST 1 VIEW COMPARISON:  02/29/2020 FINDINGS: Midline trachea. Borderline cardiomegaly. Atherosclerosis in the transverse aorta. No pleural effusion or pneumothorax. No congestive failure. Low lung volumes with resultant pulmonary interstitial prominence. No lobar consolidation. IMPRESSION: Low lung volumes, without acute disease. Aortic Atherosclerosis (ICD10-I70.0). Electronically Signed   By: Abigail Miyamoto M.D.   On: 03/13/2020 18:25   Portable Chest 1 View  Result Date: 02/29/2020 CLINICAL DATA:  Syncope. EXAM: PORTABLE CHEST 1 VIEW COMPARISON:  January 04, 2020 FINDINGS: The heart size and mediastinal contours are within normal limits. There is moderate severity calcification of the aortic arch. Both lungs are clear. Degenerative changes are seen involving both shoulders. The visualized skeletal structures are otherwise unremarkable. IMPRESSION: No active disease. Electronically Signed   By: Virgina Norfolk M.D.   On: 02/29/2020 20:52   DG Shoulder Right Portable  Result Date: 02/29/2020 CLINICAL DATA:  Fall in shower. Right shoulder pain. EXAM: PORTABLE RIGHT SHOULDER COMPARISON:  None. FINDINGS: Prominent spurring of the humeral head and glenoid with prominent loss of articular space in the glenohumeral joint compatible with severe osteoarthritis. No fracture or malalignment is identified. Subacromial morphology is type 2 (curved). IMPRESSION: Severe glenohumeral osteoarthritis. No acute findings.  Electronically Signed   By: Van Clines M.D.   On: 02/29/2020 18:22   ECHOCARDIOGRAM COMPLETE  Result Date: 03/01/2020    ECHOCARDIOGRAM REPORT   Patient Name:   BEANCA KIESTER Date of Exam:  03/01/2020 Medical Rec #:  [id number]        Height:       66.0 in Accession #:    [id number]       Weight:       243.0 lb Date of Birth:  10/06/1946        BSA:          2.172 m Patient Age:    56 years         BP:           153/83 mmHg Patient Gender: F                HR:           81 bpm. Exam Location:  Inpatient Procedure: 2D Echo Indications:    Syncope R55  History:        Patient has prior history of Echocardiogram examinations, most                 recent 12/16/2013. Risk Factors:Dyslipidemia, Diabetes and Former                 Smoker.  Sonographer:    Mikki Santee RDCS (AE) Referring Phys: [id number] [place]  1. Mid and basal posterior lateral wall hypokinesis . Left ventricular ejection fraction, by estimation, is 50 to 55%. The left ventricle has low normal function. The left ventricle demonstrates regional wall motion abnormalities (see scoring diagram/findings for description). Left ventricular diastolic parameters were normal.  2. Right ventricular systolic function is normal. The right ventricular size is normal.  3. The mitral valve is normal in structure. Trivial mitral valve regurgitation. No evidence of mitral stenosis.  4. The aortic valve was not well visualized. Aortic valve regurgitation is not visualized. No aortic stenosis is present.  5. The inferior vena cava is normal in size with greater than 50% respiratory variability, suggesting right atrial pressure of 3 mmHg. FINDINGS  Left Ventricle: Mid and basal posterior lateral wall hypokinesis. Left ventricular ejection fraction, by estimation, is 50 to 55%. The left ventricle has low normal function. The left ventricle demonstrates regional wall motion abnormalities. The left ventricular internal cavity size was normal in size. There is no left ventricular hypertrophy. Left ventricular diastolic parameters were normal. Right Ventricle: The right ventricular size is normal. No increase in right ventricular wall thickness.  Right ventricular systolic function is normal. Left Atrium: Left atrial size was normal in size. Right Atrium: Right atrial size was normal in size. Pericardium: There is no evidence of pericardial effusion. Mitral Valve: The mitral valve is normal in structure. There is mild thickening of the mitral valve leaflet(s). Normal mobility of the mitral valve leaflets. Trivial mitral valve regurgitation. No evidence of mitral valve stenosis. Tricuspid Valve: The tricuspid valve is normal in structure. Tricuspid valve regurgitation is trivial. No evidence of tricuspid stenosis. Aortic Valve: The aortic valve was not well visualized. Aortic valve regurgitation is not visualized. No aortic stenosis is present. Pulmonic Valve: The pulmonic valve was normal in structure. Pulmonic valve regurgitation is not visualized. No evidence of pulmonic stenosis. Aorta: The aortic root is normal in size and structure. Venous: The inferior vena cava is normal in size with greater than 50% respiratory variability, suggesting right  atrial pressure of 3 mmHg. IAS/Shunts: No atrial level shunt detected by color flow Doppler.  LEFT VENTRICLE PLAX 2D LVIDd:         4.30 cm  Diastology LVIDs:         2.50 cm  LV e' lateral:   7.51 cm/s LV PW:         0.90 cm  LV E/e' lateral: 8.0 LV IVS:        0.90 cm  LV e' medial:    6.53 cm/s LVOT diam:     2.20 cm  LV E/e' medial:  9.2 LV SV:         81 LV SV Index:   37 LVOT Area:     3.80 cm  RIGHT VENTRICLE RV S prime:     13.40 cm/s TAPSE (M-mode): 2.0 cm LEFT ATRIUM             Index       RIGHT ATRIUM           Index LA diam:        3.60 cm 1.66 cm/m  RA Area:     11.20 cm LA Vol (A2C):   30.3 ml 13.95 ml/m RA Volume:   24.20 ml  11.14 ml/m LA Vol (A4C):   45.0 ml 20.71 ml/m LA Biplane Vol: 41.0 ml 18.87 ml/m  AORTIC VALVE LVOT Vmax:   94.20 cm/s LVOT Vmean:  66.800 cm/s LVOT VTI:    0.213 m  AORTA Ao Root diam: 3.70 cm MITRAL VALVE MV Area (PHT): 3.60 cm    SHUNTS MV Decel Time: 211 msec     Systemic VTI:  0.21 m MV E velocity: 60.00 cm/s  Systemic Diam: 2.20 cm MV A velocity: 85.30 cm/s MV E/A ratio:  0.70 Jenkins Rouge MD Electronically signed by Jenkins Rouge MD Signature Date/Time: 03/01/2020/11:55:17 AM    Final    DG HIP UNILAT WITH PELVIS 2-3 VIEWS LEFT  Result Date: 03/02/2020 CLINICAL DATA:  Fall EXAM: DG HIP (WITH OR WITHOUT PELVIS) 2-3V LEFT COMPARISON:  None. FINDINGS: Hip joints and SI joints are symmetric and unremarkable. No acute bony abnormality. Specifically, no fracture, subluxation, or dislocation. IMPRESSION: Negative. Electronically Signed   By: Rolm Baptise M.D.   On: 03/02/2020 02:54       Subjective: No new complaints.  Discharge Exam: Vitals:   03/16/20 0818 03/16/20 0840  BP: 127/64   Pulse: 78   Resp: 20   Temp: 98 F (36.7 C)   SpO2: 98% 99%   Vitals:   03/16/20 0029 03/16/20 0405 03/16/20 0818 03/16/20 0840  BP: (!) 130/53 (!) 148/66 127/64   Pulse: 84 87 78   Resp: _0 Temp: 98.8 F (37.1 C) 98.7 F (37.1 C) 98 F (36.7 C)   TempSrc: Oral Oral Oral   SpO2: 100% 99% 98% 99%  Weight:      Height:        General: Pt is alert, awake, not in acute distress Cardiovascular: RRR, S1/S2 +, no rubs, no gallops Respiratory: CTA bilaterally, no wheezing, no rhonchi Abdominal: Soft, NT, ND, bowel sounds + Extremities: no edema, no cyanosis    The results of significant diagnostics from this hospitalization (including imaging, microbiology, ancillary and laboratory) are listed below for reference.     Microbiology: Recent Results (from the past 240 hour(s))  SARS CORONAVIRUS 2 (TAT 6-24 HRS) Nasopharyngeal Nasopharyngeal Swab     Status: None   Collection Time: 03/13/20  5:25 PM   Specimen: Nasopharyngeal Swab  Result Value Ref Range Status   SARS Coronavirus 2 NEGATIVE NEGATIVE Final    Comment: (NOTE) SARS-CoV-2 target nucleic acids are NOT DETECTED. The SARS-CoV-2 RNA is generally detectable in upper and lower respiratory  specimens during the acute phase of infection. Negative results do not preclude SARS-CoV-2 infection, do not rule out co-infections with other pathogens, and should not be used as the sole basis for treatment or other patient management decisions. Negative results must be combined with clinical observations, patient history, and epidemiological information. The expected result is Negative. Fact Sheet for Patients: SugarRoll.be Fact Sheet for Healthcare Providers: https://www.woods-mathews.com/ This test is not yet approved or cleared by the [place] FDA and  has been authorized for detection and/or diagnosis of SARS-CoV-2 by FDA under an Emergency Use Authorization (EUA). This EUA will remain  in effect (meaning this test can be used) for the duration of the COVID-19 declaration under Section 56 4(b)(1) of the Act, 21 U.S.C. section 360bbb-3(b)(1), unless the authorization is terminated or revoked sooner. Performed at [facility], [place] [address], [place], [place] [address]   Culture, Urine     Status: Abnormal (Preliminary result)   Collection Time: 03/15/20 11:19 AM   Specimen: Urine, Random  Result Value Ref Range Status   Specimen Description URINE, RANDOM  Final   Special Requests   Final    NONE Performed at [facility], [place] [address] [place], [place] [address]    Culture 30,000 COLONIES/mL GRAM NEGATIVE RODS (A)  Final   Report Status PENDING  Incomplete     Labs: BNP (last 3 results) No results for input(s): BNP in the last 8760 hours. Basic Metabolic Panel: Recent Labs  Lab 03/13/20 1436 03/13/20 1511 03/15/20 0355  NA 138 137 141  K 4.1 3.8 3.4*  CL 104 105 110  CO2 16*  --  20*  GLUCOSE 98 96 111*  BUN 24* 24* 11  CREATININE 1.07* 0.90 0.76  CALCIUM 9.9  --  8.7*   Liver Function Tests: Recent Labs  Lab 03/13/20 1436  AST 33  ALT 28  ALKPHOS 66  BILITOT 0.7  PROT 7.1  ALBUMIN 4.3   No  results for input(s): LIPASE, AMYLASE in the last 168 hours. No results for input(s): AMMONIA in the last 168 hours. CBC: Recent Labs  Lab 03/13/20 1436 03/13/20 1511 03/15/20 0355  WBC 8.9  --  6.2  NEUTROABS 4.6  --   --   HGB 14.8 14.6 13.1  HCT 44.8 43.0 40.4  MCV 89.6  --  90.8  PLT 381  --  253   Cardiac Enzymes: No results for input(s): CKTOTAL, CKMB, CKMBINDEX, TROPONINI in the last 168 hours. BNP: Invalid input(s): POCBNP CBG: Recent Labs  Lab 03/15/20 1555 03/15/20 2025 03/16/20 0030 03/16/20 0407 03/16/20 0830  GLUCAP 105* 100* 114* 125* 112*   D-Dimer No results for input(s): DDIMER in the last 72 hours. Hgb A1c Recent Labs    03/13/20 1913 03/14/20 0333  HGBA1C 5.9* 5.9*   Lipid Profile Recent Labs    03/14/20 0333  CHOL 129  HDL 31*  LDLCALC 61  TRIG 185*  CHOLHDL 4.2   Thyroid function studies Recent Labs    03/13/20 2246  TSH 4.791*   Anemia work up No results for input(s): VITAMINB12, FOLATE, FERRITIN, TIBC, IRON, RETICCTPCT in the last 72 hours. Urinalysis    Component Value Date/Time   COLORURINE YELLOW  03/14/2020 2000   APPEARANCEUR CLOUDY (A) 03/14/2020 2000   LABSPEC 1.024 03/14/2020 2000   PHURINE 5.0 03/14/2020 2000   GLUCOSEU >=500 (A) 03/14/2020 2000   GLUCOSEU >=1000 (A) 07/01/2019 1429   HGBUR NEGATIVE 03/14/2020 2000   BILIRUBINUR NEGATIVE 03/14/2020 2000   KETONESUR NEGATIVE 03/14/2020 2000   PROTEINUR NEGATIVE 03/14/2020 2000   UROBILINOGEN 0.2 07/01/2019 1429   NITRITE NEGATIVE 03/14/2020 2000   LEUKOCYTESUR TRACE (A) 03/14/2020 2000   Sepsis Labs Invalid input(s): PROCALCITONIN,  WBC,  LACTICIDVEN Microbiology Recent Results (from the past 240 hour(s))  SARS CORONAVIRUS 2 (TAT 6-24 HRS) Nasopharyngeal Nasopharyngeal Swab     Status: None   Collection Time: 03/13/20  5:25 PM   Specimen: Nasopharyngeal Swab  Result Value Ref Range Status   SARS Coronavirus 2 NEGATIVE NEGATIVE Final    Comment:  (NOTE) SARS-CoV-2 target nucleic acids are NOT DETECTED. The SARS-CoV-2 RNA is generally detectable in upper and lower respiratory specimens during the acute phase of infection. Negative results do not preclude SARS-CoV-2 infection, do not rule out co-infections with other pathogens, and should not be used as the sole basis for treatment or other patient management decisions. Negative results must be combined with clinical observations, patient history, and epidemiological information. The expected result is Negative. Fact Sheet for Patients: SugarRoll.be Fact Sheet for Healthcare Providers: https://www.woods-mathews.com/ This test is not yet approved or cleared by the [place] FDA and  has been authorized for detection and/or diagnosis of SARS-CoV-2 by FDA under an Emergency Use Authorization (EUA). This EUA will remain  in effect (meaning this test can be used) for the duration of the COVID-19 declaration under Section 56 4(b)(1) of the Act, 21 U.S.C. section 360bbb-3(b)(1), unless the authorization is terminated or revoked sooner. Performed at [facility], [place] [address] [place], [place] [address]   Culture, Urine     Status: Abnormal (Preliminary result)   Collection Time: 03/15/20 11:19 AM   Specimen: Urine, Random  Result Value Ref Range Status   Specimen Description URINE, RANDOM  Final   Special Requests   Final    NONE Performed at [facility], [place] [address], [place], [place] [address]    Culture 30,000 COLONIES/mL GRAM NEGATIVE RODS (A)  Final   Report Status PENDING  Incomplete     Time coordinating discharge: 34 minutes  SIGNED:   Hosie Poisson, MD  [facility] 03/16/2020, 11:33 AM

## 2020-03-16 NOTE — Progress Notes (Signed)
Report called to nurse at [facility], [place]. AVS documentation thoroughly went over and all questions answered. IV removed with no bleeding noted. All belongings sent with patient and pt family. Pt transported via stretcher by [facility].

## 2020-03-16 NOTE — TOC Transition Note (Signed)
Transition of Care [facility]) - CM/SW Discharge Note   Patient Details  Name: Alexandra Wolfe MRN: [id number] Date of Birth: [birth year]  [facility] CM/SW Contact:  Geralynn Ochs, LCSW Phone Number: 03/16/2020, 1:24 PM   Clinical Narrative:   Nurse to call report to [phone], Room 320    Final next level of care: Skilled Nursing Facility Barriers to Discharge: Barriers Resolved   Patient Goals and CMS Choice Patient states their goals for this hospitalization and ongoing recovery are:: patient unable to participate in goal setting due to disorientation CMS Medicare.gov Compare Post Acute Care list provided to:: Patient Represenative (must comment) Choice offered to / list presented to : Adult Children  Discharge Placement              Patient chooses bed at: [facility] Patient to be transferred to facility by: [place] Name of family member notified: [name] Patient and family notified of of transfer: 03/16/20  Discharge Plan and Services     Post Acute Care Choice: [place]                               Social Determinants of Health (SDOH) Interventions     Readmission Risk Interventions No flowsheet data found.

## 2020-03-16 NOTE — Progress Notes (Signed)
Physical Therapy Treatment Patient Details Name: Alexandra Wolfe MRN: [id number] DOB: 06-Jan-1946 Today's Date: 03/16/2020    History of Present Illness 74 yo female admitted to ED on 4/4 with AMS, speech changes, fall on 4/3. CT head negative for acute findings, C-spine cleared, and MRI with no acute findings. Pt with recent admission 3/23-3/25 for fall, SDH which is now smaller per CT. PMH includes vertigo, LBP, HLD, DOE, depression, anxiety.    PT Comments    Pt with improved tolerance for OOB mobility this session. Pt still slightly confused, but stuttering and aphasia appears improved vs eval. Pt ambulated hallway distance with use of RW and requires multiple safety cues. SNF remains appropriate as d/c plan, will continue to follow acutely.    Follow Up Recommendations  SNF;Supervision for mobility/OOB     Equipment Recommendations  Other (comment)(defer to next venue)    Recommendations for Other Services       Precautions / Restrictions Precautions Precautions: Fall Precaution Comments: dizzy, unsteady, h/o multiple falls so make sure alarm are set Restrictions Weight Bearing Restrictions: No    Mobility  Bed Mobility Overal bed mobility: Needs Assistance             General bed mobility comments: up in recliner, requests stay in recliner  Transfers Overall transfer level: Needs assistance Equipment used: Rolling walker (2 wheeled) Transfers: Sit to/from Stand Sit to Stand: Min assist Stand pivot transfers: Min assist       General transfer comment: min assist to steady, pt able to power up and rise with increased time. Verbal cuing for hand placement.  Ambulation/Gait Ambulation/Gait assistance: Min guard Gait Distance (Feet): 70 Feet(30+30+10) Assistive device: Rolling walker (2 wheeled) Gait Pattern/deviations: Step-through pattern;Decreased stride length;Trunk flexed;Drifts right/left Gait velocity: decr   General Gait Details: min guard for  safety, verbal cuing for upright posture, placement in RW, cuing for standing rest break x1 minute to recover DOE 3/4.   Stairs             Wheelchair Mobility    Modified Rankin (Stroke Patients Only)       Balance Overall balance assessment: Needs assistance Sitting-balance support: Feet supported;No upper extremity supported Sitting balance-Leahy Scale: Fair     Standing balance support: Bilateral upper extremity supported Standing balance-Leahy Scale: Poor Standing balance comment: needs external support/assist                            Cognition Arousal/Alertness: Awake/alert Behavior During Therapy: Impulsive;Flat affect Overall Cognitive Status: Impaired/Different from baseline Area of Impairment: Safety/judgement;Awareness;Problem solving;Memory;Following commands                 Orientation Level: Disoriented to;Time(states year is 1921) Current Attention Level: Sustained Memory: Decreased short-term memory Following Commands: Follows one step commands consistently Safety/Judgement: Decreased awareness of safety Awareness: Intellectual Problem Solving: Slow processing;Difficulty sequencing;Requires verbal cues;Requires tactile cues General Comments: Pt reports she had been staying with her son, but recently had returned to her own apartment with Alvis Lemmings coming out      Exercises General Exercises - Lower Extremity Long Arc Quad: AROM;Both;10 reps;Seated Mini-Sqauts: AROM;Both;5 reps;Seated;Standing Mini Squats Limitations: sit to stand from recliner with UE use to power up    General Comments        Pertinent Vitals/Pain Pain Assessment: Faces Faces Pain Scale: Hurts little more Pain Location: general discomfort Pain Descriptors / Indicators: Moaning;Grimacing;Discomfort Pain Intervention(s): Limited activity within patient's tolerance;Monitored during session;Premedicated  before session;Repositioned    Home Living                       Prior Function            PT Goals (current goals can now be found in the care plan section) Acute Rehab PT Goals Patient Stated Goal: none stated PT Goal Formulation: With patient Time For Goal Achievement: 03/28/20 Potential to Achieve Goals: Good Progress towards PT goals: Progressing toward goals    Frequency    Min 2X/week      PT Plan Current plan remains appropriate    Co-evaluation              AM-PAC PT "6 Clicks" Mobility   Outcome Measure  Help needed turning from your back to your side while in a flat bed without using bedrails?: A Little Help needed moving from lying on your back to sitting on the side of a flat bed without using bedrails?: A Lot Help needed moving to and from a bed to a chair (including a wheelchair)?: A Lot Help needed standing up from a chair using your arms (e.g., wheelchair or bedside chair)?: A Little Help needed to walk in hospital room?: A Little Help needed climbing 3-5 steps with a railing? : A Lot 6 Click Score: 15    End of Session Equipment Utilized During Treatment: Gait belt Activity Tolerance: Patient tolerated treatment well Patient left: with family/visitor present;in chair;with chair alarm set;with call bell/phone within reach Nurse Communication: Mobility status PT Visit Diagnosis: Difficulty in walking, not elsewhere classified (R26.2);Unsteadiness on feet (R26.81);History of falling (Z91.81)     Time: [id number] PT Time Calculation (min) (ACUTE ONLY): 19 min  Charges:  $Gait Training: 8-22 mins                     Vimal Derego E, PT [place] Pager [phone]  Office [phone]    Zebbie Ace D Lotus Santillo 03/16/2020, 1:25 PM

## 2020-03-16 NOTE — Progress Notes (Signed)
Occupational Therapy Treatment Patient Details Name: Alexandra Wolfe MRN: [id number] DOB: [birth year] Today's Date: 03/16/2020    History of present illness 74 yo female admitted to ED on 4/4 with AMS, speech changes, fall on 4/3. CT head negative for acute findings, C-spine cleared, and MRI with no acute findings. Pt with recent admission 3/23-3/25 for fall, SDH which is now smaller per CT. PMH includes vertigo, LBP, HLD, DOE, depression, anxiety.   OT comments  Treatment session with focus on functional mobility, orientation, and activity tolerance during self-care tasks.  Pt fatigued upon arrival but agreeable to grooming tasks at sink.  Pt completed sit > stand with min guard, utilized RW this session as pt reports using RW at home.  Ambulated short distances in room with RW to sink with min assist, pt completed grooming tasks in standing with increased reports of lightheadedness with activity.  Returned to sitting.  Pt washed hair while seated.  Engaged in discussion regarding recommendation for shower seat at home - pt reports [facility] plans to bring seat during next visit but then she was in the hospital.  Pt will continue to benefit from OT services in preparation for d/c to next venue of care.   Follow Up Recommendations  SNF;Supervision/Assistance - 24 hour    Equipment Recommendations  None recommended by OT(reports [facility] providing shower seat)       Precautions / Restrictions Precautions Precautions: Fall Precaution Comments: dizzy, unsteady, h/o multiple falls so make sure alarm are set       Mobility Bed Mobility               General bed mobility comments: received in recliner and returned to recliner  Transfers Overall transfer level: Needs assistance Equipment used: Rolling walker (2 wheeled) Transfers: Sit to/from [facility] Sit to Stand: Min assist Stand pivot transfers: Min assist                ADL either performed or assessed with  clinical judgement   ADL Overall ADL's : Needs assistance/impaired     Grooming: Set up;Supervision/safety;Wash/dry hands;Wash/dry face;Brushing hair;Standing         Lower Body Bathing Details (indicate cue type and reason): reiterated recommendation for seated showering.  Pt reports Alvis Lemmings is to bring her a shower seat next visit         Toilet Transfer: Minimal assistance;Stand-pivot;BSC Toilet Transfer Details (indicate cue type and reason): pt with multiple falls, would benefit from use of BSC at night to decrease falls         Functional mobility during ADLs: Minimal assistance;Rolling walker                 Cognition Arousal/Alertness: Awake/alert Behavior During Therapy: Impulsive;Flat affect Overall Cognitive Status: Impaired/Different from baseline Area of Impairment: Safety/judgement;Awareness;Problem solving                         Safety/Judgement: Decreased awareness of safety;Decreased awareness of deficits Awareness: Intellectual Problem Solving: Slow processing;Difficulty sequencing;Requires verbal cues;Requires tactile cues General Comments: Pt reports she had been staying with her son, but recently had returned to her own apartment with Alvis Lemmings coming out                   Pertinent Vitals/ Pain       Pain Assessment: No/denies pain         Frequency  Min 2X/week        Progress Toward  Goals  OT Goals(current goals can now be found in the care plan section)  Progress towards OT goals: Progressing toward goals  Acute Rehab OT Goals Patient Stated Goal: to get stronger OT Goal Formulation: With patient Time For Goal Achievement: 03/28/20 Potential to Achieve Goals: Good  Plan Discharge plan remains appropriate       AM-PAC OT "6 Clicks" Daily Activity     Outcome Measure   Help from another person eating meals?: A Little Help from another person taking care of personal grooming?: A Little Help from another person  toileting, which includes using toliet, bedpan, or urinal?: A Lot Help from another person bathing (including washing, rinsing, drying)?: A Lot Help from another person to put on and taking off regular upper body clothing?: A Little Help from another person to put on and taking off regular lower body clothing?: A Lot 6 Click Score: 15    End of Session Equipment Utilized During Treatment: Gait belt;Rolling walker  OT Visit Diagnosis: Unsteadiness on feet (R26.81);Other abnormalities of gait and mobility (R26.89);History of falling (Z91.81);Other symptoms and signs involving cognitive function   Activity Tolerance Patient tolerated treatment well   Patient Left in chair;with call bell/phone within reach;with chair alarm set   Nurse Communication Mobility status        Time: 1109-1130 OT Time Calculation (min): 21 min  Charges: OT General Charges $OT Visit: 1 Visit OT Treatments $Self Care/Home Management : 8-22 mins    Simonne Come [phone] 03/16/2020, 11:40 AM

## 2020-03-16 NOTE — NC FL2 (Signed)
[place] LEVEL OF CARE SCREENING TOOL     IDENTIFICATION  Patient Name: Alexandra Wolfe Birthdate: 04/13/1946 Sex: female Admission Date (Current Location): 03/13/2020  [facility] and [place] Number:  Herbalist and Address:  [facility] [place], [place] [address], [place], [place] [address]      Provider Number: [id number]  Attending Physician Name and Address:  Hosie Poisson, MD  Relative Name and Phone Number:       Current Level of Care: Hospital Recommended Level of Care: [place] Prior Approval Number:    Date Approved/Denied:   PASRR Number: [id number]  Discharge Plan: SNF    Current Diagnoses: Patient Active Problem List   Diagnosis Date Noted  . Acute encephalopathy 03/13/2020  . Acute subdural hematoma ([place]) 02/29/2020  . Syncope 02/29/2020  . Mild renal insufficiency 02/29/2020  . Essential hypertension 02/29/2020  . Asthma 02/29/2020  . Chronic dyspnea 01/04/2020  . Allergic rhinitis 01/04/2020  . External otitis of right ear 07/25/2019  . Bilateral foot pain 07/01/2019  . Diabetic neuropathy ([place]) 03/04/2018  . Obstructive sleep apnea 04/04/2016  . Peripheral edema 12/23/2015  . Hypersomnolence 12/22/2015  . Baker's cyst of knee 11/24/2013  . Left knee pain 11/24/2013  . Dyspnea 11/24/2013  . Rash 05/26/2012  . Bilateral shoulder pain 12/13/2011  . Diabetes mellitus type II, non insulin dependent ([place]) 11/20/2011  . Leukocytosis 11/20/2011  . Preoperative clearance 11/20/2011  . Eustachian tube dysfunction 05/20/2011  . LBP (low back pain) 05/20/2011  . Encounter for well adult exam with abnormal findings 05/20/2011  . Vertigo 05/20/2011  . Hypothyroidism 02/15/2011  . Other dysphagia 02/15/2011  . URINARY INCONTINENCE 08/23/2009  . Seasonal and perennial allergic rhinitis 04/02/2009  . CONSTIPATION 09/27/2008  . Hyperlipidemia 03/11/2008  . BUNIONS, BILATERAL 12/23/2007  . Depression  09/13/2007  . OSTEOARTHROSIS NOS, LOWER LEG 09/13/2007  . COLONIC POLYPS, HX OF 09/13/2007    Orientation RESPIRATION BLADDER Height & Weight     Self, Place  Normal Incontinent Weight: 242 lb 8.1 oz (110 kg) Height:  5\' 8"  (172.7 cm)  BEHAVIORAL SYMPTOMS/MOOD NEUROLOGICAL BOWEL NUTRITION STATUS      Continent Diet(see DC summary)  AMBULATORY STATUS COMMUNICATION OF NEEDS Skin   Limited Assist Verbally Normal                       Personal Care Assistance Level of Assistance  Bathing, Feeding, Dressing Bathing Assistance: Limited assistance Feeding assistance: Independent Dressing Assistance: Limited assistance     Functional Limitations Info  Speech     Speech Info: Impaired(expressive aphasia)    SPECIAL CARE FACTORS FREQUENCY  PT (By licensed PT), OT (By licensed OT), Speech therapy     PT Frequency: 5x/wk OT Frequency: 5x/wk     Speech Therapy Frequency: 5x/wk      Contractures Contractures Info: Not present    Additional Factors Info  Code Status, Allergies, Insulin Sliding Scale Code Status Info: Full Allergies Info: Aripiprazole, Simvastatin   Insulin Sliding Scale Info: 0-9 units every 4 hours       Current Medications (03/16/2020):  This is the current hospital active medication list Current Facility-Administered Medications  Medication Dose Route Frequency Provider Last Rate Last Admin  . acetaminophen (TYLENOL) tablet 650 mg  650 mg Oral Q4H PRN Cristal Ford, DO   650 mg at 03/16/20 [id number]   Or  . acetaminophen (TYLENOL) 160 MG/5ML solution 650 mg  650 mg  Per Tube Q4H PRN Cristal Ford, DO       Or  . acetaminophen (TYLENOL) suppository 650 mg  650 mg Rectal Q4H PRN Cristal Ford, DO      . albuterol (PROVENTIL) (2.5 MG/3ML) 0.083% nebulizer solution 3 mL  3 mL Inhalation Q6H PRN Cristal Ford, DO      . atorvastatin (LIPITOR) tablet 80 mg  80 mg Oral Daily Cristal Ford, DO   80 mg at 03/16/20 [id number]  . fluticasone (FLONASE) 50  MCG/ACT nasal spray 2 spray  2 spray Each Nare Daily Cristal Ford, DO   2 spray at 03/16/20 [id number]  . hydrOXYzine (ATARAX/VISTARIL) tablet 25 mg  25 mg Oral TID PRN Marliss Coots, PA-C      . insulin aspart (novoLOG) injection 0-9 Units  0-9 Units Subcutaneous Q4H Cristal Ford, DO   1 Units at 03/16/20 [id number]  . levETIRAcetam (KEPPRA) tablet 500 mg  500 mg Oral BID Cristal Ford, DO   500 mg at 03/16/20 [id number]  . loratadine (CLARITIN) tablet 10 mg  10 mg Oral Daily Cristal Ford, DO   10 mg at 03/16/20 [id number]  . mirabegron ER (MYRBETRIQ) tablet 50 mg  50 mg Oral QHS Mikhail, [place], DO   50 mg at 03/15/20 2341  . mometasone-formoterol (DULERA) 200-5 MCG/ACT inhaler 2 puff  2 puff Inhalation BID Cristal Ford, DO   2 puff at 03/16/20 0840  . multivitamin with minerals tablet 1 tablet  1 tablet Oral Daily Cristal Ford, DO   1 tablet at 03/16/20 [id number]  . senna-docusate (Senokot-S) tablet 1 tablet  1 tablet Oral QHS PRN Cristal Ford, DO      . thyroid (ARMOUR) tablet 30 mg  30 mg Oral Daily Cristal Ford, DO   30 mg at 03/16/20 [id number]     Discharge Medications: Please see discharge summary for a list of discharge medications.  Relevant Imaging Results:  Relevant Lab Results:   Additional Information SS# [id number]  Geralynn Ochs, LCSW

## 2020-03-17 ENCOUNTER — Encounter: Payer: Self-pay | Admitting: Adult Health

## 2020-03-17 ENCOUNTER — Non-Acute Institutional Stay (SKILLED_NURSING_FACILITY): Payer: Medicare Other | Admitting: Adult Health

## 2020-03-17 DIAGNOSIS — E034 Atrophy of thyroid (acquired): Secondary | ICD-10-CM

## 2020-03-17 DIAGNOSIS — S065XAA Traumatic subdural hemorrhage with loss of consciousness status unknown, initial encounter: Secondary | ICD-10-CM

## 2020-03-17 DIAGNOSIS — N3281 Overactive bladder: Secondary | ICD-10-CM

## 2020-03-17 DIAGNOSIS — J3089 Other allergic rhinitis: Secondary | ICD-10-CM

## 2020-03-17 DIAGNOSIS — E119 Type 2 diabetes mellitus without complications: Secondary | ICD-10-CM | POA: Diagnosis not present

## 2020-03-17 DIAGNOSIS — G9340 Encephalopathy, unspecified: Secondary | ICD-10-CM

## 2020-03-17 DIAGNOSIS — S065X9A Traumatic subdural hemorrhage with loss of consciousness of unspecified duration, initial encounter: Secondary | ICD-10-CM

## 2020-03-17 DIAGNOSIS — J302 Other seasonal allergic rhinitis: Secondary | ICD-10-CM

## 2020-03-17 DIAGNOSIS — F5101 Primary insomnia: Secondary | ICD-10-CM

## 2020-03-17 DIAGNOSIS — I10 Essential (primary) hypertension: Secondary | ICD-10-CM

## 2020-03-17 LAB — URINE CULTURE: Culture: 30000 — AB

## 2020-03-17 NOTE — Progress Notes (Addendum)
Location:  [facility] Room Number: [place] of Service:  SNF (31) Provider:  Durenda Age, DNP, FNP-BC  Patient Care Team: Biagio Borg, MD as PCP - General  Extended Emergency Contact Information Primary Emergency Contact: [facility] Address: [address] [place], [place] [address] [name] of [place] Phone: [phone] Mobile Phone: [phone] Relation: Son  Code Status:  Full Code  Goals of care: Advanced Directive information Advanced Directives 03/14/2020  Does Patient Have a Medical Advance Directive? No  Type of Advance Directive -  Does patient want to make changes to medical advance directive? No - Patient declined  Copy of [place] in Chart? -  Would patient like information on creating a medical advance directive? No - Patient declined  Pre-existing out of facility DNR order (yellow form or pink MOST form) -     Chief Complaint  Patient presents with  . Acute Visit    Follow-up hospitalization    HPI:  Pt is a 74 y.o. female who was admitted to [facility] on 03/16/20 post hospitalization 03/13/20 to 03/16/20 for altered mental status. She was hospitalized on 3.23/21 to 03/02/20 for subdural hematoma and was also found to have BPPV. She was discharged home with home health services. Repeat CT of the head was unremarkable and negative for acute intracranial abnormalities. EEG does not show any seizures or epileptiform discharges. MRI of the brain was negative for acute stroke. No new recommendations were given.    Past Medical History:  Diagnosis Date  . ALLERGIC RHINITIS 04/02/2009   no per pt  . Anxiety   . BACK PAIN 09/27/2008  . BUNIONS, BILATERAL 12/23/2007  . CHEST DISCOMFORT, ATYPICAL 11/07/2009  . Chronic LBP   . COLONIC POLYPS, HX OF 09/13/2007  . CONSTIPATION 09/27/2008  . DEPRESSION 09/13/2007  . Diabetes mellitus    diet controlled  . DYSPNEA ON EXERTION  02/06/2010  . Eustachian tube dysfunction 05/20/2011  . FOOT PAIN, RIGHT 09/27/2008  . HYPERLIPIDEMIA 03/11/2008  . LBP (low back pain) 05/20/2011  . Leukocytosis 11/20/2011  . OSTEOARTHROSIS NOS, LOWER LEG 09/13/2007  . SHOULDER PAIN, LEFT 02/14/2009  . SVT (supraventricular tachycardia) ([place])   . SYMPTOM, PALPITATIONS 09/13/2007  . URINARY INCONTINENCE 08/23/2009  . Vertigo 05/20/2011   Past Surgical History:  Procedure Laterality Date  . BUNIONECTOMY     right  . ccx    . CHOLECYSTECTOMY    . LUMBAR LAMINECTOMY    . LUMBAR LAMINECTOMY    . SHOULDER ARTHROSCOPY  12/13/2011   Procedure: ARTHROSCOPY SHOULDER;  Surgeon: Augustin Schooling;  Location: [facility];  Service: Orthopedics;  Laterality: Left;  Left Shoulder ArthroscopyDebridement Limited Tenodesis Open Rotator Cuff Repair Spur Removal Right Shoulder Injection     Allergies  Allergen Reactions  . Aripiprazole     REACTION: agitation  . Simvastatin     REACTION: myalgia    Outpatient Encounter Medications as of 03/17/2020  Medication Sig  . acetaminophen (TYLENOL) 325 MG tablet Take 2 tablets (650 mg total) by mouth every 6 (six) hours as needed for mild pain or headache.  . albuterol (VENTOLIN HFA) 108 (90 Base) MCG/ACT inhaler Inhale 2 puffs into the lungs every 6 (six) hours as needed for wheezing or shortness of breath.  [name] atorvastatin (LIPITOR) 80 MG tablet Take 1 tablet (80 mg total) by mouth daily.  . budesonide-formoterol (SYMBICORT) 160-4.5 MCG/ACT inhaler Inhale 2  puffs into the lungs 2 (two) times daily.  . Calcium Carbonate (CALCIUM 500 PO) Take 2,000 mg by mouth daily.  . cetirizine (ZYRTEC) 10 MG tablet Take 1 tablet (10 mg total) by mouth daily.  . fluticasone (FLONASE) 50 MCG/ACT nasal spray SHAKE LIQUID AND USE 2 SPRAYS IN EACH NOSTRIL DAILY FOR ALLERGIES (Patient taking differently: Place 2 sprays into both nostrils daily. )  . meclizine (ANTIVERT) 25 MG tablet Take 1 tablet (25 mg total) by mouth 3 (three) times daily as  needed for dizziness.  . metFORMIN (GLUCOPHAGE-XR) 500 MG 24 hr tablet TAKE 4 TABLETS BY MOUTH EVERY MORNING (Patient taking differently: Take 2,000 mg by mouth every morning. )  . metoprolol succinate (TOPROL-XL) 25 MG 24 hr tablet TAKE 1 TABLET(25 MG) BY MOUTH DAILY (Patient taking differently: Take 25 mg by mouth daily. )  . Multiple Vitamin (MULTIVITAMIN WITH MINERALS) TABS tablet Take 1 tablet by mouth daily.  [name] MYRBETRIQ 50 MG TB24 tablet Take 50 mg by mouth at bedtime.   . NP THYROID 30 MG tablet Take 30 mg by mouth daily.  . ondansetron (ZOFRAN) 4 MG tablet Take 4 mg by mouth every 8 (eight) hours as needed for nausea or vomiting.  . senna-docusate (SENOKOT-S) 8.6-50 MG tablet Take 1 tablet by mouth at bedtime as needed for mild constipation.  . traZODone (DESYREL) 50 MG tablet TAKE 1 TABLET(50 MG) BY MOUTH AT BEDTIME (Patient taking differently: Take 50 mg by mouth at bedtime. )  . [DISCONTINUED] levETIRAcetam (KEPPRA) 500 MG tablet Take 1 tablet (500 mg total) by mouth 2 (two) times daily.   No facility-administered encounter medications on file as of 03/17/2020.    Review of Systems  GENERAL: No change in appetite, no fatigue, no weight changes, no fever, chills or weakness MOUTH and THROAT: Denies oral discomfort, gingival pain or bleeding RESPIRATORY: no cough, SOB, DOE, wheezing, hemoptysis CARDIAC: No chest pain, edema or palpitations GI: No abdominal pain, diarrhea, constipation, heart burn, nausea or vomiting GU: Denies dysuria, frequency, hematuria or discharge NEUROLOGICAL: Denies dizziness, syncope, numbness, or headache PSYCHIATRIC: Denies feelings of depression or anxiety. No report of hallucinations, insomnia, paranoia, or agitation   Immunization History  Administered Date(s) Administered  . H1N1 11/17/2008  . Influenza Whole 09/27/2008, 10/04/2009, 08/14/2010  . Influenza, High Dose Seasonal PF 09/03/2017, 09/04/2018, 01/07/2020  . Influenza, Seasonal, Injecte,  Preservative Fre 11/09/2012  . Influenza,inj,Quad PF,6+ Mos 11/24/2013, 12/22/2015, 08/22/2016  . Pneumococcal Conjugate-13 03/01/2015  . Pneumococcal Polysaccharide-23 09/27/2008, 11/09/2012  . Tdap 11/09/2012   Pertinent  Health Maintenance Due  Topic Date Due  . OPHTHALMOLOGY EXAM  08/08/2019  . FOOT EXAM  06/30/2020  . URINE MICROALBUMIN  06/30/2020  . INFLUENZA VACCINE  07/09/2020  . HEMOGLOBIN A1C  09/13/2020  . MAMMOGRAM  07/07/2021  . COLONOSCOPY  03/20/2022  . DEXA SCAN  Completed  . PNA vac Low Risk Adult  Completed   Fall Risk  07/01/2019 03/04/2018 03/04/2017 03/01/2016 12/19/2015  Falls in the past year? 0 No Yes No No  Comment - - - - no falls verbalized   Number falls in past yr: - - 1 - -  Injury with Fall? - - No - -     Vitals:   03/17/20 1615  BP: 136/70  Pulse: 84  Resp: 20  Temp: (!) 97.5 F (36.4 C)  Weight: 237 lb 6.4 oz (107.7 kg)  Height: 5\' 6"  (1.676 m)   Body mass index is 38.32 kg/m.  Physical  Exam  GENERAL APPEARANCE: Well nourished. In no acute distress. Obese SKIN:  Skin is warm and dry.  MOUTH and THROAT: Lips are without lesions. Oral mucosa is moist and without lesions. Tongue is normal in shape, size, and color and without lesions RESPIRATORY: Breathing is even & unlabored, BS CTAB CARDIAC: RRR, no murmur,no extra heart sounds, no edema GI: Abdomen soft, normal BS, no masses, no tenderness EXTREMITIES:  Able to move X 4 extremities NEUROLOGICAL: There is no tremor. Speech is clear. Alert and oriented X 3. PSYCHIATRIC:  Affect and behavior are appropriate  Labs reviewed: Recent Labs    03/13/20 1436 03/13/20 1436 03/13/20 1511 03/15/20 0355 03/16/20 1136  NA 138   < > 137 141 139  K 4.1   < > 3.8 3.4* 3.9  CL 104   < > 105 110 108  CO2 16*  --   --  20* 19*  GLUCOSE 98   < > 96 111* 138*  BUN 24*   < > 24* 11 9  CREATININE 1.07*   < > 0.90 0.76 0.78  CALCIUM 9.9  --   --  8.7* 8.9   < > = values in this interval not  displayed.   Recent Labs    01/04/20 1429 03/01/20 0259 03/13/20 1436  AST 21 35 33  ALT 19 42 28  ALKPHOS 67 70 66  BILITOT 0.4 0.8 0.7  PROT 7.1 6.5 7.1  ALBUMIN 4.6 4.0 4.3   Recent Labs    07/01/19 1429 02/29/20 1603 03/01/20 0259 03/01/20 0259 03/13/20 1436 03/13/20 1511 03/15/20 0355  WBC 14.5*   < > 14.4*  --  8.9  --  6.2  NEUTROABS 8.1*  --  8.1*  --  4.6  --   --   HGB 15.5*   < > 14.2   < > 14.8 14.6 13.1  HCT 46.6*   < > 41.9   < > 44.8 43.0 40.4  MCV 87.6   < > 88.0  --  89.6  --  90.8  PLT 404.0*   < > 341  --  381  --  253   < > = values in this interval not displayed.   Lab Results  Component Value Date   TSH 4.791 (H) 03/13/2020   Lab Results  Component Value Date   HGBA1C 5.9 (H) 03/14/2020   Lab Results  Component Value Date   CHOL 129 03/14/2020   HDL 31 (L) 03/14/2020   LDLCALC 61 03/14/2020   LDLDIRECT 74.0 01/04/2020   TRIG 185 (H) 03/14/2020   CHOLHDL 4.2 03/14/2020    Significant Diagnostic Results in last 30 days:  EEG  Result Date: 03/14/2020 Lora Havens, MD     03/14/2020  2:22 PM Patient Name: Alexandra Wolfe MRN: [id number] Epilepsy Attending: Lora Havens Referring Physician/Provider: Dr Cristal Ford Date: 03/14/2020 Duration: 26.15 minutes Patient history: 74 year old female with medical history of left parafalcine subdural hematoma presented for slurred speech and confusion.  EEG to evaluate for seizures. Level of alertness: Awake AEDs during EEG study: [place] Technical aspects: This EEG study was done with scalp electrodes positioned according to the 10-20 International system of electrode placement. Electrical activity was acquired at a sampling rate of 500Hz  and reviewed with a high frequency filter of 70Hz  and a low frequency filter of 1Hz . EEG data were recorded continuously and digitally stored. Description: The posterior dominant rhythm consists of 9 Hz activity of moderate voltage (25-35 uV)  seen predominantly in  posterior head regions, symmetric and reactive to eye opening and eye closing.  EEG showed intermittent generalized and maximal left frontotemporal region 2 to 3 Hz delta slowing. Hyperventilation and photic stimulation were not performed. Abnormality -Intermittent slow, generalized and maximal left frontal region IMPRESSION: This study is suggestive of cortical dysfunction in left frontal region as well as mild diffuse encephalopathy, nonspecific to etiology.  No seizures or epileptiform discharges were seen throughout the recording. Lora Havens   DG Knee 1-2 Views Left  Result Date: 03/02/2020 CLINICAL DATA:  Fall EXAM: LEFT KNEE - 1-2 VIEW COMPARISON:  None. FINDINGS: Chondrocalcinosis with tricompartment degenerative changes. No joint effusion. No acute bony abnormality. Specifically, no fracture, subluxation, or dislocation. IMPRESSION: Degenerative changes.  No acute bony abnormality. Electronically Signed   By: Rolm Baptise M.D.   On: 03/02/2020 02:55   DG Knee 1-2 Views Right  Result Date: 03/02/2020 CLINICAL DATA:  Fall EXAM: RIGHT KNEE - 1-2 VIEW COMPARISON:  None. FINDINGS: Chondrocalcinosis with joint space narrowing and spurring in all 3 compartments. No acute bony abnormality. Specifically, no fracture, subluxation, or dislocation. No joint effusion. IMPRESSION: Mild degenerative changes.  No acute bony abnormality. Electronically Signed   By: Rolm Baptise M.D.   On: 03/02/2020 02:55   CT HEAD WO CONTRAST  Result Date: 03/13/2020 CLINICAL DATA:  Possible stroke. Aphasia with increased respiratory rate. Fall yesterday. EXAM: CT HEAD WITHOUT CONTRAST CT CERVICAL SPINE WITHOUT CONTRAST TECHNIQUE: Multidetector CT imaging of the head and cervical spine was performed following the standard protocol without intravenous contrast. Multiplanar CT image reconstructions of the cervical spine were also generated. COMPARISON:  Head CT 03/01/2020 and cervical spine CT 02/29/2020 FINDINGS: CT HEAD  FINDINGS Brain: There is a small residual subdural hematoma along the left aspect of the falx which has decreased in size and density compared to the prior study and now measures up to 9 mm in thickness (previously 10 mm). There is minimal residual subdural blood layering along the left tentorium. No new intracranial hemorrhage, acute infarct, mass, or midline shift is identified. Mild cerebral atrophy is within normal limits for age. Hypodensities in the cerebral white matter bilaterally are nonspecific but compatible with mild chronic small vessel ischemic disease. Vascular: Calcified atherosclerosis at the skull base. No hyperdense vessel. Skull: No fracture or suspicious osseous lesion. Sinuses/Orbits: Paranasal sinuses and mastoid air cells are clear. Bilateral cataract extraction. Other: None. CT CERVICAL SPINE FINDINGS Alignment: Cervical spine straightening. Trace anterolisthesis of C7 on T1, likely degenerative and facet mediated. Skull base and vertebrae: No acute fracture or suspicious osseous lesion. Soft tissues and spinal canal: No prevertebral fluid or swelling. No visible canal hematoma. Disc levels: Unchanged disc degeneration including advanced disc space narrowing, degenerative endplate irregularity, and sclerosis at C5-6 and C6-7. Severe right-sided facet arthrosis at C2-3 and C4-5. Moderate to severe neural foraminal stenosis on the left at C3-4 and C5-6 due to uncovertebral spurring. Upper chest: Clear lung apices. Other: Calcified plaque at the left carotid bifurcation. IMPRESSION: 1. No evidence of acute intracranial abnormality. 2. Decreased size of left parafalcine subdural hematoma. 3. No acute cervical spine fracture. Electronically Signed   By: Logan Bores M.D.   On: 03/13/2020 16:39   CT HEAD WO CONTRAST  Result Date: 03/01/2020 CLINICAL DATA:  Head trauma, intracranial venous injury suspected. Additional history provided: Patient admitted yesterday status post fall, CT showed  small falcine subdural hematoma, patient very dizzy today, reports occipital headache EXAM: CT HEAD WITHOUT  CONTRAST TECHNIQUE: Contiguous axial images were obtained from the base of the skull through the vertex without intravenous contrast. COMPARISON:  Head CT 02/29/2020 FINDINGS: Brain: An acute subdural hemorrhage layering along the left aspect of the falx and left tentorium is unchanged as compared to head CT 02/29/2020. The hemorrhage again measures up to 10 mm in greatest thickness (series 3, image 24). No interval hemorrhage is demonstrated. No evidence of intracranial mass. No midline shift. No hydrocephalus. Vascular: No hyperdense vessel.  Atherosclerotic calcifications. Skull: Normal. Negative for fracture or focal lesion. Sinuses/Orbits: Visualized orbits demonstrate no acute abnormality. Trace ethmoid sinus mucosal thickening. No significant mastoid effusion. Other: Redemonstrated left parietal scalp hematoma. There is also a small amount of subcutaneous gas at this site suggestive of laceration. IMPRESSION: Stable acute subdural hemorrhage layering along the left aspect of the falx and left tentorium, again measuring up to 10 mm in thickness. No significant mass effect. Redemonstrated left posterior scalp hematoma/laceration. Electronically Signed   By: Kellie Simmering DO   On: 03/01/2020 08:59   CT HEAD WO CONTRAST  Result Date: 02/29/2020 CLINICAL DATA:  Syncopal episode, hit head EXAM: CT HEAD WITHOUT CONTRAST TECHNIQUE: Contiguous axial images were obtained from the base of the skull through the vertex without intravenous contrast. COMPARISON:  None. FINDINGS: Brain: Acute subdural hematoma is present along the left aspect of the falx measuring up to 10 mm in thickness. There is no significant associated mass effect. Prominence of the ventricles and sulci reflects minor generalized parenchymal volume loss. Gray-white differentiation is preserved. Vascular: No hyperdense vessel or unexpected  calcification. Skull: Calvarium is unremarkable. Sinuses/Orbits: No acute finding. Other: Posterior scalp soft tissue swelling. IMPRESSION: Acute left falcine subdural hematoma.  No significant mass effect. These results were called by telephone at the time of interpretation on 02/29/2020 at 5:01 pm to provider Veryl Speak , who verbally acknowledged these results. Electronically Signed   By: Macy Mis M.D.   On: 02/29/2020 17:04   CT Cervical Spine Wo Contrast  Result Date: 03/13/2020 CLINICAL DATA:  Possible stroke. Aphasia with increased respiratory rate. Fall yesterday. EXAM: CT HEAD WITHOUT CONTRAST CT CERVICAL SPINE WITHOUT CONTRAST TECHNIQUE: Multidetector CT imaging of the head and cervical spine was performed following the standard protocol without intravenous contrast. Multiplanar CT image reconstructions of the cervical spine were also generated. COMPARISON:  Head CT 03/01/2020 and cervical spine CT 02/29/2020 FINDINGS: CT HEAD FINDINGS Brain: There is a small residual subdural hematoma along the left aspect of the falx which has decreased in size and density compared to the prior study and now measures up to 9 mm in thickness (previously 10 mm). There is minimal residual subdural blood layering along the left tentorium. No new intracranial hemorrhage, acute infarct, mass, or midline shift is identified. Mild cerebral atrophy is within normal limits for age. Hypodensities in the cerebral white matter bilaterally are nonspecific but compatible with mild chronic small vessel ischemic disease. Vascular: Calcified atherosclerosis at the skull base. No hyperdense vessel. Skull: No fracture or suspicious osseous lesion. Sinuses/Orbits: Paranasal sinuses and mastoid air cells are clear. Bilateral cataract extraction. Other: None. CT CERVICAL SPINE FINDINGS Alignment: Cervical spine straightening. Trace anterolisthesis of C7 on T1, likely degenerative and facet mediated. Skull base and vertebrae: No acute  fracture or suspicious osseous lesion. Soft tissues and spinal canal: No prevertebral fluid or swelling. No visible canal hematoma. Disc levels: Unchanged disc degeneration including advanced disc space narrowing, degenerative endplate irregularity, and sclerosis at C5-6 and C6-7. Severe  right-sided facet arthrosis at C2-3 and C4-5. Moderate to severe neural foraminal stenosis on the left at C3-4 and C5-6 due to uncovertebral spurring. Upper chest: Clear lung apices. Other: Calcified plaque at the left carotid bifurcation. IMPRESSION: 1. No evidence of acute intracranial abnormality. 2. Decreased size of left parafalcine subdural hematoma. 3. No acute cervical spine fracture. Electronically Signed   By: Logan Bores M.D.   On: 03/13/2020 16:39   CT Cervical Spine Wo Contrast  Result Date: 02/29/2020 CLINICAL DATA:  Syncope EXAM: CT CERVICAL SPINE WITHOUT CONTRAST TECHNIQUE: Multidetector CT imaging of the cervical spine was performed without intravenous contrast. Multiplanar CT image reconstructions were also generated. COMPARISON:  None. FINDINGS: Alignment: No significant listhesis. Skull base and vertebrae: No acute cervical spine fracture. Vertebral body heights are maintained apart from degenerative endplate irregularity. Soft tissues and spinal canal: No prevertebral fluid or swelling. No visible canal hematoma. Disc levels: Multilevel degenerative changes are present including disc space narrowing, endplate osteophytes, and facet and uncovertebral hypertrophy. Upper chest: Negative. Other: Mild calcified plaque at the left ICA origin. IMPRESSION: No acute cervical spine fracture. Electronically Signed   By: Macy Mis M.D.   On: 02/29/2020 17:07   MR BRAIN WO CONTRAST  Result Date: 03/14/2020 CLINICAL DATA:  Subdural hematoma 2 weeks ago with recent change in mental status. EXAM: MRI HEAD WITHOUT CONTRAST TECHNIQUE: Multiplanar, multiecho pulse sequences of the brain and surrounding structures were  obtained without intravenous contrast. COMPARISON:  CT head without contrast 03/13/2020, 03/01/2020, and 02/29/2020. FINDINGS: Brain: The left para falcine subacute subdural hematoma is again noted. Remote lacunar infarcts are present within the internal capsule bilaterally. No acute infarct is present. Scattered subcortical T2 hyperintensities bilaterally are mildly advanced for age. The ventricles are of normal size. No significant extraaxial fluid collection is present. The internal auditory canals are within normal limits. The brainstem and cerebellum are within normal limits. Vascular: Flow is present in the major intracranial arteries. Skull and upper cervical spine: The craniocervical junction is normal. Upper cervical spine is within normal limits. Marrow signal is unremarkable. Sinuses/Orbits: The paranasal sinuses and mastoid air cells are clear. Bilateral lens replacements are noted. Globes and orbits are otherwise unremarkable. IMPRESSION: 1. Stable left para falcine subacute subdural hematoma. 2. No acute intracranial abnormality. 3. Scattered subcortical T2 hyperintensities bilaterally are mildly advanced for age. The finding is nonspecific but can be seen in the setting of chronic microvascular ischemia, a demyelinating process such as multiple sclerosis, vasculitis, complicated migraine headaches, or as the sequelae of a prior infectious or inflammatory process. Electronically Signed   By: San Morelle M.D.   On: 03/14/2020 11:42   DG Chest Portable 1 View  Result Date: 03/13/2020 CLINICAL DATA:  Altered mental status.  Diabetes.  Hyperlipidemia. EXAM: PORTABLE CHEST 1 VIEW COMPARISON:  02/29/2020 FINDINGS: Midline trachea. Borderline cardiomegaly. Atherosclerosis in the transverse aorta. No pleural effusion or pneumothorax. No congestive failure. Low lung volumes with resultant pulmonary interstitial prominence. No lobar consolidation. IMPRESSION: Low lung volumes, without acute disease.  Aortic Atherosclerosis (ICD10-I70.0). Electronically Signed   By: Abigail Miyamoto M.D.   On: 03/13/2020 18:25   Portable Chest 1 View  Result Date: 02/29/2020 CLINICAL DATA:  Syncope. EXAM: PORTABLE CHEST 1 VIEW COMPARISON:  January 04, 2020 FINDINGS: The heart size and mediastinal contours are within normal limits. There is moderate severity calcification of the aortic arch. Both lungs are clear. Degenerative changes are seen involving both shoulders. The visualized skeletal structures are otherwise unremarkable. IMPRESSION:  No active disease. Electronically Signed   By: Virgina Norfolk M.D.   On: 02/29/2020 20:52   DG Shoulder Right Portable  Result Date: 02/29/2020 CLINICAL DATA:  Fall in shower. Right shoulder pain. EXAM: PORTABLE RIGHT SHOULDER COMPARISON:  None. FINDINGS: Prominent spurring of the humeral head and glenoid with prominent loss of articular space in the glenohumeral joint compatible with severe osteoarthritis. No fracture or malalignment is identified. Subacromial morphology is type 2 (curved). IMPRESSION: Severe glenohumeral osteoarthritis. No acute findings. Electronically Signed   By: Van Clines M.D.   On: 02/29/2020 18:22   ECHOCARDIOGRAM COMPLETE  Result Date: 03/01/2020    ECHOCARDIOGRAM REPORT   Patient Name:   NOURHAN CROASMUN Date of Exam: 03/01/2020 Medical Rec #:  [id number]        Height:       66.0 in Accession #:    [id number]       Weight:       243.0 lb Date of Birth:  08/13/1946        BSA:          2.172 m Patient Age:    74 years         BP:           153/83 mmHg Patient Gender: F                HR:           81 bpm. Exam Location:  Inpatient Procedure: 2D Echo Indications:    Syncope R55  History:        Patient has prior history of Echocardiogram examinations, most                 recent 12/16/2013. Risk Factors:Dyslipidemia, Diabetes and Former                 Smoker.  Sonographer:    Mikki Santee RDCS (AE) Referring Phys: [id number] [place]  1. Mid and basal posterior lateral wall hypokinesis . Left ventricular ejection fraction, by estimation, is 50 to 55%. The left ventricle has low normal function. The left ventricle demonstrates regional wall motion abnormalities (see scoring diagram/findings for description). Left ventricular diastolic parameters were normal.  2. Right ventricular systolic function is normal. The right ventricular size is normal.  3. The mitral valve is normal in structure. Trivial mitral valve regurgitation. No evidence of mitral stenosis.  4. The aortic valve was not well visualized. Aortic valve regurgitation is not visualized. No aortic stenosis is present.  5. The inferior vena cava is normal in size with greater than 50% respiratory variability, suggesting right atrial pressure of 3 mmHg. FINDINGS  Left Ventricle: Mid and basal posterior lateral wall hypokinesis. Left ventricular ejection fraction, by estimation, is 50 to 55%. The left ventricle has low normal function. The left ventricle demonstrates regional wall motion abnormalities. The left ventricular internal cavity size was normal in size. There is no left ventricular hypertrophy. Left ventricular diastolic parameters were normal. Right Ventricle: The right ventricular size is normal. No increase in right ventricular wall thickness. Right ventricular systolic function is normal. Left Atrium: Left atrial size was normal in size. Right Atrium: Right atrial size was normal in size. Pericardium: There is no evidence of pericardial effusion. Mitral Valve: The mitral valve is normal in structure. There is mild thickening of the mitral valve leaflet(s). Normal mobility of the mitral valve leaflets. Trivial mitral valve regurgitation. No evidence of mitral valve stenosis. Tricuspid Valve:  The tricuspid valve is normal in structure. Tricuspid valve regurgitation is trivial. No evidence of tricuspid stenosis. Aortic Valve: The aortic valve was not well visualized.  Aortic valve regurgitation is not visualized. No aortic stenosis is present. Pulmonic Valve: The pulmonic valve was normal in structure. Pulmonic valve regurgitation is not visualized. No evidence of pulmonic stenosis. Aorta: The aortic root is normal in size and structure. Venous: The inferior vena cava is normal in size with greater than 50% respiratory variability, suggesting right atrial pressure of 3 mmHg. IAS/Shunts: No atrial level shunt detected by color flow Doppler.  LEFT VENTRICLE PLAX 2D LVIDd:         4.30 cm  Diastology LVIDs:         2.50 cm  LV e' lateral:   7.51 cm/s LV PW:         0.90 cm  LV E/e' lateral: 8.0 LV IVS:        0.90 cm  LV e' medial:    6.53 cm/s LVOT diam:     2.20 cm  LV E/e' medial:  9.2 LV SV:         81 LV SV Index:   37 LVOT Area:     3.80 cm  RIGHT VENTRICLE RV S prime:     13.40 cm/s TAPSE (M-mode): 2.0 cm LEFT ATRIUM             Index       RIGHT ATRIUM           Index LA diam:        3.60 cm 1.66 cm/m  RA Area:     11.20 cm LA Vol (A2C):   30.3 ml 13.95 ml/m RA Volume:   24.20 ml  11.14 ml/m LA Vol (A4C):   45.0 ml 20.71 ml/m LA Biplane Vol: 41.0 ml 18.87 ml/m  AORTIC VALVE LVOT Vmax:   94.20 cm/s LVOT Vmean:  66.800 cm/s LVOT VTI:    0.213 m  AORTA Ao Root diam: 3.70 cm MITRAL VALVE MV Area (PHT): 3.60 cm    SHUNTS MV Decel Time: 211 msec    Systemic VTI:  0.21 m MV E velocity: 60.00 cm/s  Systemic Diam: 2.20 cm MV A velocity: 85.30 cm/s MV E/A ratio:  0.70 Jenkins Rouge MD Electronically signed by Jenkins Rouge MD Signature Date/Time: 03/01/2020/11:55:17 AM    Final    DG HIP UNILAT WITH PELVIS 2-3 VIEWS LEFT  Result Date: 03/02/2020 CLINICAL DATA:  Fall EXAM: DG HIP (WITH OR WITHOUT PELVIS) 2-3V LEFT COMPARISON:  None. FINDINGS: Hip joints and SI joints are symmetric and unremarkable. No acute bony abnormality. Specifically, no fracture, subluxation, or dislocation. IMPRESSION: Negative. Electronically Signed   By: Rolm Baptise M.D.   On: 03/02/2020 02:54     Assessment/Plan  1. Acute encephalopathy -  CT of the head was unremarkable and negative for acute intracranial abnormalities. EEG does not show any seizures or epileptiform discharges. MRI of the brain was negative for acute stroke. No new recommendations were given.  - now resolved  2. Subdural hematoma (HCC) - sustained from a fall, MRI brain without contrast on 03/14/20 showed stable left para falcine subdural hematoma - review of records showed that she was supposed to be on Keppra 500 mg BID X 7 days only -  EEG does not show any seizures or epileptiform discharges - will discontinue Keppra - follow up with neurosurgery, Dr. Duffy Rhody  3. Hypothyroidism due to acquired atrophy of thyroid  Lab Results  Component Value Date   TSH 4.791 (H) 03/13/2020   - continue NP-Thyroid  4. Diabetes mellitus type II, non insulin dependent (HCC) Lab Results  Component Value Date   HGBA1C 5.9 (H) 03/14/2020   - continue Glucophage XR and CBG daily  5. OAB (overactive bladder) - continue Myrbetriq ER  6. Seasonal and perennial allergic rhinitis - continue Flonase Nasal spray and Zyrtec  7. Primary insomnia - continue Trazodone  8. Essential hypertension - continue Metoprolol succinate ER     Family/ staff Communication:  Discussed plan of care with resident and chage nurse.  Labs/tests ordered:  CBG daily  Goals of care:   Short-term care   Durenda Age, DNP, FNP-BC [facility] [phone] (Monday-Friday 8:00 a.m. - 5:00 p.m.) [phone] (after hours)

## 2020-03-20 NOTE — Addendum Note (Signed)
Addended by: Durenda Age C on: 03/20/2020 04:08 PM   Modules accepted: Orders

## 2020-03-21 ENCOUNTER — Non-Acute Institutional Stay (SKILLED_NURSING_FACILITY): Payer: Medicare Other | Admitting: Internal Medicine

## 2020-03-21 ENCOUNTER — Encounter: Payer: Self-pay | Admitting: Internal Medicine

## 2020-03-21 DIAGNOSIS — R55 Syncope and collapse: Secondary | ICD-10-CM | POA: Diagnosis not present

## 2020-03-21 DIAGNOSIS — E119 Type 2 diabetes mellitus without complications: Secondary | ICD-10-CM | POA: Diagnosis not present

## 2020-03-21 DIAGNOSIS — S065XAA Traumatic subdural hemorrhage with loss of consciousness status unknown, initial encounter: Secondary | ICD-10-CM

## 2020-03-21 DIAGNOSIS — S065X9A Traumatic subdural hemorrhage with loss of consciousness of unspecified duration, initial encounter: Secondary | ICD-10-CM

## 2020-03-21 DIAGNOSIS — G9340 Encephalopathy, unspecified: Secondary | ICD-10-CM

## 2020-03-21 NOTE — Assessment & Plan Note (Signed)
Mental status testing at SNF.

## 2020-03-21 NOTE — Progress Notes (Signed)
NURSING HOME LOCATION:  [facility] ROOM NUMBER: [id number]   CODE STATUS:  Full Code  PCP:  Biagio Borg. MD.  This is a comprehensive admission note to [facility] performed on this date less than 30 days from date of admission. Included are preadmission medical/surgical history; reconciled medication list; family history; social history and comprehensive review of systems.  Corrections and additions to the records were documented. Comprehensive physical exam was also performed. Additionally a clinical summary was entered for each active diagnosis pertinent to this admission in the Problem List to enhance continuity of care.  HPI: Patient was hospitalized 4/5-03/16/2020 admitted from home for altered mental status in the context of recent admission 3/23-3/25 for subdural hematoma.  At that time she was found to have vertigo with BPPV.  In the ED CT of the head was unremarkable for any acute change.  MRI was also negative for any acute stroke.  EEG revealed no seizure activity.  She had been on Keppra as prophylaxis. There were no significant lab abnormalities to explain the encephalopathy. Hypokalemia was replaced. TSH was therapeutic. She had stage III a CKD with GFR of 51 and creatinine 1.07. She was on glipizide and Jardiance which were discontinued.  Metformin 2000 mg as XR formulation each day was continued at discharge.  Hemoglobin A1c was 5.9% on 4/6, prediabetic level.  Past medical and surgical history: Includes history of vertigo, history of SVT, history of leukocytosis, dyslipidemia, eustachian tube dysfunction, history of colon polyps, and chronic back pain. Surgeries include cholecystectomy and lumbar laminectomy.  Social history: Nondrinker; former 10-pack-year smoker.  Family history: Reviewed   Review of systems: She denies any definite cardiac or neurologic prodrome prior to the syncope and head injury despite a past history of PSVT.  She states that the last  thing she remembers was grabbing at the shower curtain. She states that prior to the episode of syncope she had been following a "bluebird" diet of 11-1499 cal and had lost 5 pounds.  She was not monitoring glucoses at home.  The morning of the syncopal episode she had eaten breakfast and taken her medications.  She had no definite hypoglycemic symptoms. She stated that she went back to the hospital after sustaining subdural because " I was speaking gibberish. I could not understand what I was saying". She describes intermittent left temporal pain since the head injury and subdural.  This can be throbbing up to a level 7.  Tylenol takes it to level 2. She also describes some intermittent confusion. She has had some increased frequency.  She also describes frank watery diarrhea since her discharge from the hospital. She has chronic shortness of breath which she states her lung specialist attributes to her obesity.  Constitutional: No fever, significant weight change, fatigue  Eyes: No redness, discharge, pain, vision change ENT/mouth: No nasal congestion, purulent discharge, earache, change in hearing, sore throat  Cardiovascular: No chest pain, palpitations, paroxysmal nocturnal dyspnea, claudication, edema  Respiratory: No cough, sputum production, hemoptysis, significant snoring, apnea  Gastrointestinal: No heartburn, dysphagia, abdominal pain, nausea /vomiting, rectal bleeding, melena Genitourinary: No dysuria, hematuria, pyuria, incontinence, nocturia Musculoskeletal: No joint stiffness, joint swelling, weakness, pain Dermatologic: No rash, pruritus, change in appearance of skin Neurologic: No  seizures Psychiatric: No significant anxiety, depression, insomnia, anorexia Endocrine: No change in hair/skin/nails, excessive thirst, excessive hunger, excessive urination (but has frequency)  Hematologic/lymphatic: No significant bruising, lymphadenopathy, abnormal bleeding Allergy/immunology: No  itchy/watery eyes, significant sneezing, urticaria, angioedema  Physical exam:  Pertinent or positive findings: She does have central obesity.  There are no definite neurologic signs or cardiopulmonary abnormalities.  General appearance: Adequately nourished; no acute distress, increased work of breathing is present.   Lymphatic: No lymphadenopathy about the head, neck, axilla. Eyes: No conjunctival inflammation or lid edema is present. There is no scleral icterus. Ears:  External ear exam shows no significant lesions or deformities.   Nose:  External nasal examination shows no deformity or inflammation. Nasal mucosa are pink and moist without lesions, exudates Oral exam: Lips and gums are healthy appearing.There is no oropharyngeal erythema or exudate. Neck:  No thyromegaly, masses, tenderness noted.    Heart:  Normal rate and regular rhythm. S1 and S2 normal without gallop, murmur, click, rub.  Lungs: Chest clear to auscultation without wheezes, rhonchi, rales, rubs. Abdomen: Bowel sounds are normal.  Abdomen is soft and nontender with no organomegaly, hernias, masses. GU: Deferred  Extremities:  No cyanosis, clubbing, edema. Neurologic exam:  Strength equal  in upper & lower extremities. Balance, Rhomberg, finger to nose testing could not be completed due to clinical state Skin: Warm & dry w/o tenting. No significant lesions or rash.  See clinical summary under each active problem in the Problem List with associated updated therapeutic plan

## 2020-03-21 NOTE — Assessment & Plan Note (Addendum)
4/5-03/16/2020 admitted with altered mental status.  On glipizide and Jardiance and high-dose Metformin at admission.  Hemoglobin A1c 5.9% on 4/6.  Metformin continued at discharge. Creatinine 1.07 and GFR 51 suggestive of stage IIIa CKD. Decrease Metformin dose. Avoid sulfonylureas because of the risk of hypoglycemia.  At the time of her syncope she had been dieting and had actually lost 5 pounds.  Hypoglycemia would need to be ruled out as etiology of the syncope since neurologic evaluation negative.

## 2020-03-21 NOTE — Patient Instructions (Signed)
See assessment and plan under each diagnosis in the problem list and acutely for this visit 

## 2020-03-23 NOTE — Assessment & Plan Note (Signed)
Serial imaging has revealed no progression despite recurrent L temporal headaches

## 2020-03-23 NOTE — Assessment & Plan Note (Signed)
Consider Event monitor post SNF discharge

## 2020-03-24 LAB — BASIC METABOLIC PANEL
BUN: 13 (ref 4–21)
CO2: 22 (ref 13–22)
Chloride: 104 (ref 99–108)
Creatinine: 0.7 (ref 0.5–1.1)
Glucose: 117
Potassium: 4.1 (ref 3.4–5.3)
Sodium: 141 (ref 137–147)

## 2020-03-24 LAB — CBC AND DIFFERENTIAL
HCT: 40 (ref 36–46)
Hemoglobin: 13.4 (ref 12.0–16.0)
Neutrophils Absolute: 3
Platelets: 263 (ref 150–399)
WBC: 7.3

## 2020-03-24 LAB — CBC: RBC: 4.56 (ref 3.87–5.11)

## 2020-03-24 LAB — COMPREHENSIVE METABOLIC PANEL
Calcium: 9.4 (ref 8.7–10.7)
GFR calc Af Amer: 90
GFR calc non Af Amer: 87.47

## 2020-03-25 DIAGNOSIS — G4733 Obstructive sleep apnea (adult) (pediatric): Secondary | ICD-10-CM

## 2020-03-25 DIAGNOSIS — M2578 Osteophyte, vertebrae: Secondary | ICD-10-CM

## 2020-03-25 DIAGNOSIS — Z6841 Body Mass Index (BMI) 40.0 and over, adult: Secondary | ICD-10-CM

## 2020-03-25 DIAGNOSIS — K5900 Constipation, unspecified: Secondary | ICD-10-CM

## 2020-03-25 DIAGNOSIS — Z7982 Long term (current) use of aspirin: Secondary | ICD-10-CM

## 2020-03-25 DIAGNOSIS — E669 Obesity, unspecified: Secondary | ICD-10-CM

## 2020-03-25 DIAGNOSIS — S0101XD Laceration without foreign body of scalp, subsequent encounter: Secondary | ICD-10-CM | POA: Diagnosis not present

## 2020-03-25 DIAGNOSIS — F419 Anxiety disorder, unspecified: Secondary | ICD-10-CM

## 2020-03-25 DIAGNOSIS — E119 Type 2 diabetes mellitus without complications: Secondary | ICD-10-CM

## 2020-03-25 DIAGNOSIS — Z7951 Long term (current) use of inhaled steroids: Secondary | ICD-10-CM

## 2020-03-25 DIAGNOSIS — G8929 Other chronic pain: Secondary | ICD-10-CM

## 2020-03-25 DIAGNOSIS — Z9181 History of falling: Secondary | ICD-10-CM

## 2020-03-25 DIAGNOSIS — Z86010 Personal history of colonic polyps: Secondary | ICD-10-CM

## 2020-03-25 DIAGNOSIS — E785 Hyperlipidemia, unspecified: Secondary | ICD-10-CM

## 2020-03-25 DIAGNOSIS — D72829 Elevated white blood cell count, unspecified: Secondary | ICD-10-CM

## 2020-03-25 DIAGNOSIS — Z791 Long term (current) use of non-steroidal anti-inflammatories (NSAID): Secondary | ICD-10-CM

## 2020-03-25 DIAGNOSIS — M19011 Primary osteoarthritis, right shoulder: Secondary | ICD-10-CM | POA: Diagnosis not present

## 2020-03-25 DIAGNOSIS — M47812 Spondylosis without myelopathy or radiculopathy, cervical region: Secondary | ICD-10-CM

## 2020-03-25 DIAGNOSIS — I081 Rheumatic disorders of both mitral and tricuspid valves: Secondary | ICD-10-CM

## 2020-03-25 DIAGNOSIS — Z7984 Long term (current) use of oral hypoglycemic drugs: Secondary | ICD-10-CM

## 2020-03-25 DIAGNOSIS — M170 Bilateral primary osteoarthritis of knee: Secondary | ICD-10-CM | POA: Diagnosis not present

## 2020-03-25 DIAGNOSIS — M4802 Spinal stenosis, cervical region: Secondary | ICD-10-CM

## 2020-03-25 DIAGNOSIS — S065X0D Traumatic subdural hemorrhage without loss of consciousness, subsequent encounter: Secondary | ICD-10-CM | POA: Diagnosis not present

## 2020-03-25 DIAGNOSIS — I10 Essential (primary) hypertension: Secondary | ICD-10-CM

## 2020-03-25 DIAGNOSIS — F329 Major depressive disorder, single episode, unspecified: Secondary | ICD-10-CM

## 2020-03-25 DIAGNOSIS — I700 Atherosclerosis of aorta: Secondary | ICD-10-CM

## 2020-03-29 ENCOUNTER — Encounter: Payer: Self-pay | Admitting: Adult Health

## 2020-03-29 ENCOUNTER — Non-Acute Institutional Stay (SKILLED_NURSING_FACILITY): Payer: Medicare Other | Admitting: Adult Health

## 2020-03-29 DIAGNOSIS — N3281 Overactive bladder: Secondary | ICD-10-CM | POA: Diagnosis not present

## 2020-03-29 DIAGNOSIS — S065X9A Traumatic subdural hemorrhage with loss of consciousness of unspecified duration, initial encounter: Secondary | ICD-10-CM

## 2020-03-29 DIAGNOSIS — E034 Atrophy of thyroid (acquired): Secondary | ICD-10-CM | POA: Diagnosis not present

## 2020-03-29 DIAGNOSIS — E785 Hyperlipidemia, unspecified: Secondary | ICD-10-CM

## 2020-03-29 DIAGNOSIS — F5101 Primary insomnia: Secondary | ICD-10-CM

## 2020-03-29 DIAGNOSIS — G4733 Obstructive sleep apnea (adult) (pediatric): Secondary | ICD-10-CM

## 2020-03-29 DIAGNOSIS — R42 Dizziness and giddiness: Secondary | ICD-10-CM

## 2020-03-29 DIAGNOSIS — S065XAA Traumatic subdural hemorrhage with loss of consciousness status unknown, initial encounter: Secondary | ICD-10-CM

## 2020-03-29 DIAGNOSIS — E1169 Type 2 diabetes mellitus with other specified complication: Secondary | ICD-10-CM | POA: Diagnosis not present

## 2020-03-29 DIAGNOSIS — J3089 Other allergic rhinitis: Secondary | ICD-10-CM

## 2020-03-29 DIAGNOSIS — J45909 Unspecified asthma, uncomplicated: Secondary | ICD-10-CM

## 2020-03-29 DIAGNOSIS — I10 Essential (primary) hypertension: Secondary | ICD-10-CM

## 2020-03-29 DIAGNOSIS — J302 Other seasonal allergic rhinitis: Secondary | ICD-10-CM

## 2020-03-29 NOTE — Progress Notes (Signed)
Location:  [facility] Room Number: [id number] Place of Service:  SNF (31) Provider:  Durenda Age, DNP, FNP-BC  Patient Care Team: Alexandra Borg, MD as PCP - General  Extended Emergency Contact Information Primary Emergency Contact: Alexandra Wolfe Address: [place], [place] [address] Alexandra Wolfe of [place] Phone: [phone] Mobile Phone: [phone] Relation: Son  Code Status:  Full Code  Goals of care: Advanced Directive information Advanced Directives 03/21/2020  Does Patient Have a Medical Advance Directive? Yes  Type of Advance Directive (No Data)  Does patient want to make changes to medical advance directive? No - Patient declined  Copy of [place] in Chart? -  Would patient like information on creating a medical advance directive? -  Pre-existing out of facility DNR order (yellow form or pink MOST form) -     Chief Complaint  Patient presents with  . Discharge Note    Patient is seen for discharge from SNF    HPI:  Pt is a 74 y.o. female who is for discharge home with Home health PT and OT.  She was admitted to [facility] on 03/16/20 post hospitalization 03/13/20 to 03/16/20 for altered mental status. Of note, she was recently hospitalized on 3/23 to 3/25 for subdural hematoma and was also found to have BPPV. She was discharged home with Home health services. CT of the head was unremarkable and negative for acute intracranial abnormalities. EEG did not show any seizures or epileptiform discharges. MRI of the brain was negative for acute stroke. No new recommendations were given. She has PMH of diabetes mellitus, colonic polyps, SVT, hyperlipidemia and low back pain.  Patient was admitted to this facility for short-term rehabilitation after the patient's recent hospitalization.  Patient has completed SNF rehabilitation and therapy has cleared the patient for discharge.   Past  Medical History:  Diagnosis Date  . ALLERGIC RHINITIS 04/02/2009   no per pt  . Anxiety   . BACK PAIN 09/27/2008  . BUNIONS, BILATERAL 12/23/2007  . CHEST DISCOMFORT, ATYPICAL 11/07/2009  . Chronic LBP   . COLONIC POLYPS, HX OF 09/13/2007  . CONSTIPATION 09/27/2008  . DEPRESSION 09/13/2007  . Diabetes mellitus    diet controlled  . DYSPNEA ON EXERTION 02/06/2010  . Eustachian tube dysfunction 05/20/2011  . FOOT PAIN, RIGHT 09/27/2008  . HYPERLIPIDEMIA 03/11/2008  . LBP (low back pain) 05/20/2011  . Leukocytosis 11/20/2011  . OSTEOARTHROSIS NOS, LOWER LEG 09/13/2007  . SHOULDER PAIN, LEFT 02/14/2009  . SVT (supraventricular tachycardia) ([facility])   . SYMPTOM, PALPITATIONS 09/13/2007  . URINARY INCONTINENCE 08/23/2009  . Vertigo 05/20/2011   Past Surgical History:  Procedure Laterality Date  . BUNIONECTOMY     right  . ccx    . CHOLECYSTECTOMY    . LUMBAR LAMINECTOMY    . LUMBAR LAMINECTOMY    . SHOULDER ARTHROSCOPY  12/13/2011   Procedure: ARTHROSCOPY SHOULDER;  Surgeon: Augustin Schooling;  Location: [facility];  Service: Orthopedics;  Laterality: Left;  Left Shoulder ArthroscopyDebridement Limited Tenodesis Open Rotator Cuff Repair Spur Removal Right Shoulder Injection     Allergies  Allergen Reactions  . Aripiprazole     REACTION: agitation  . Simvastatin     REACTION: myalgia    Outpatient Encounter Medications as of 03/29/2020  Medication Sig  . acetaminophen (TYLENOL) 325 MG tablet Take 2 tablets (650 mg total) by mouth every 6 (six) hours as needed  for mild pain or headache.  . albuterol (VENTOLIN HFA) 108 (90 Base) MCG/ACT inhaler Inhale 2 puffs into the lungs every 6 (six) hours as needed for wheezing or shortness of breath.  [name] atorvastatin (LIPITOR) 80 MG tablet Take 1 tablet (80 mg total) by mouth daily.  . budesonide-formoterol (SYMBICORT) 160-4.5 MCG/ACT inhaler Inhale 2 puffs into the lungs 2 (two) times daily.  . Calcium Carbonate (CALCIUM 500 PO) Take 1,000 mg by mouth 2 (two)  times daily.   . cetirizine (ZYRTEC) 10 MG tablet Take 1 tablet (10 mg total) by mouth daily.  . fluticasone (FLONASE) 50 MCG/ACT nasal spray Place 2 sprays into both nostrils daily.  . meclizine (ANTIVERT) 25 MG tablet Take 1 tablet (25 mg total) by mouth 3 (three) times daily as needed for dizziness.  . metoprolol succinate (TOPROL-XL) 25 MG 24 hr tablet TAKE 1 TABLET(25 MG) BY MOUTH DAILY  . Multiple Vitamin (MULTIVITAMIN WITH MINERALS) TABS tablet Take 1 tablet by mouth daily.  [name] MYRBETRIQ 50 MG TB24 tablet Take 1 tablet (50 mg total) by mouth at bedtime.  . NON FORMULARY Heart healthy lcs diet  . NP THYROID 30 MG tablet Take 1 tablet (30 mg total) by mouth daily.  . ondansetron (ZOFRAN) 4 MG tablet Take 1 tablet (4 mg total) by mouth every 8 (eight) hours as needed for nausea or vomiting.  . senna-docusate (SENOKOT-S) 8.6-50 MG tablet Take 1 tablet by mouth at bedtime as needed for mild constipation.  . traZODone (DESYREL) 50 MG tablet TAKE 1 TABLET(50 MG) BY MOUTH AT BEDTIME  . [DISCONTINUED] metFORMIN (GLUCOPHAGE) 500 MG tablet Take 1 tablet (500 mg total) by mouth 2 (two) times daily with a meal.  . metFORMIN (GLUMETZA) 500 MG (MOD) 24 hr tablet Take 1 tablet (500 mg total) by mouth 2 (two) times daily with a meal.  . [DISCONTINUED] albuterol (VENTOLIN HFA) 108 (90 Base) MCG/ACT inhaler Inhale 2 puffs into the lungs every 6 (six) hours as needed for wheezing or shortness of breath.  . [DISCONTINUED] atorvastatin (LIPITOR) 80 MG tablet Take 1 tablet (80 mg total) by mouth daily.  . [DISCONTINUED] budesonide-formoterol (SYMBICORT) 160-4.5 MCG/ACT inhaler Inhale 2 puffs into the lungs 2 (two) times daily.  . [DISCONTINUED] fluticasone (FLONASE) 50 MCG/ACT nasal spray SHAKE LIQUID AND USE 2 SPRAYS IN EACH NOSTRIL DAILY FOR ALLERGIES  . [DISCONTINUED] meclizine (ANTIVERT) 25 MG tablet Take 1 tablet (25 mg total) by mouth 3 (three) times daily as needed for dizziness.  . [DISCONTINUED] metFORMIN  (GLUCOPHAGE) 500 MG tablet Take 500 mg by mouth 2 (two) times daily with a meal.  . [DISCONTINUED] metFORMIN (GLUCOPHAGE-XR) 500 MG 24 hr tablet TAKE 4 TABLETS BY MOUTH EVERY MORNING  . [DISCONTINUED] metoprolol succinate (TOPROL-XL) 25 MG 24 hr tablet TAKE 1 TABLET(25 MG) BY MOUTH DAILY  . [DISCONTINUED] MYRBETRIQ 50 MG TB24 tablet Take 50 mg by mouth at bedtime.   . [DISCONTINUED] NP THYROID 30 MG tablet Take 30 mg by mouth daily.  . [DISCONTINUED] ondansetron (ZOFRAN) 4 MG tablet Take 4 mg by mouth every 8 (eight) hours as needed for nausea or vomiting.  . [DISCONTINUED] traZODone (DESYREL) 50 MG tablet TAKE 1 TABLET(50 MG) BY MOUTH AT BEDTIME   No facility-administered encounter medications on file as of 03/29/2020.    Review of Systems  GENERAL: No change in appetite, no fatigue, no weight changes, no fever, chills or weakness MOUTH and THROAT: Denies oral discomfort, gingival pain or bleeding RESPIRATORY: no cough, SOB, DOE,  wheezing, hemoptysis CARDIAC: No chest pain, edema or palpitations GI: No abdominal pain, diarrhea, constipation, heart burn, nausea or vomiting GU: Denies dysuria, frequency, hematuria, incontinence, or discharge NEUROLOGICAL: Denies dizziness, syncope, numbness, or headache PSYCHIATRIC: Denies feelings of depression or anxiety. No report of hallucinations, insomnia, paranoia, or agitation    Immunization History  Administered Date(s) Administered  . H1N1 11/17/2008  . Influenza Whole 09/27/2008, 10/04/2009, 08/14/2010  . Influenza, High Dose Seasonal PF 09/03/2017, 09/04/2018, 01/07/2020  . Influenza, Seasonal, Injecte, Preservative Fre 11/09/2012  . Influenza,inj,Quad PF,6+ Mos 11/24/2013, 12/22/2015, 08/22/2016  . Pneumococcal Conjugate-13 03/01/2015  . Pneumococcal Polysaccharide-23 09/27/2008, 11/09/2012  . Tdap 11/09/2012   Pertinent  Health Maintenance Due  Topic Date Due  . OPHTHALMOLOGY EXAM  08/08/2019  . FOOT EXAM  06/30/2020  . URINE  MICROALBUMIN  06/30/2020  . INFLUENZA VACCINE  07/09/2020  . HEMOGLOBIN A1C  09/13/2020  . MAMMOGRAM  07/07/2021  . COLONOSCOPY  03/20/2022  . DEXA SCAN  Completed  . PNA vac Low Risk Adult  Completed   Fall Risk  07/01/2019 03/04/2018 03/04/2017 03/01/2016 12/19/2015  Falls in the past year? 0 No Yes No No  Comment - - - - no falls verbalized   Number falls in past yr: - - 1 - -  Injury with Fall? - - No - -     Vitals:   03/29/20 1138  BP: 132/72  Pulse: 88  Resp: 19  Temp: (!) 97.2 F (36.2 C)  TempSrc: Oral  SpO2: 97%  Weight: 238 lb 12.8 oz (108.3 kg)  Height: 5\' 6"  (1.676 m)   Body mass index is 38.54 kg/m.  Physical Exam  GENERAL APPEARANCE: Well nourished. In no acute distress.  SKIN:  Skin is warm and dry.  MOUTH and THROAT: Lips are without lesions. Oral mucosa is moist and without lesions. Tongue is normal in shape, size, and color and without lesions RESPIRATORY: Breathing is even & unlabored, BS CTAB CARDIAC: RRR, no murmur,no extra heart sounds, no edema GI: Abdomen soft, normal BS, no masses, no tenderness EXTREMITIES:  Able to move X 4 extremities NEUROLOGICAL: There is no tremor. Speech is clear. Alert and oriented X 3. PSYCHIATRIC:  Affect and behavior are appropriate  Labs reviewed: Recent Labs    03/13/20 1511 03/13/20 1511 03/15/20 0355 03/16/20 1136 03/24/20 0000  NA 137   < > 141 139 141  K 3.8   < > 3.4* 3.9 4.1  CL 105   < > 110 108 104  CO2  --    < > 20* 19* 22  GLUCOSE 96  --  111* 138*  --   BUN 24*   < > 11 9 13   CREATININE 0.90   < > 0.76 0.78 0.7  CALCIUM  --    < > 8.7* 8.9 9.4   < > = values in this interval not displayed.   Recent Labs    01/04/20 1429 03/01/20 0259 03/13/20 1436  AST 21 35 33  ALT 19 42 28  ALKPHOS 67 70 66  BILITOT 0.4 0.8 0.7  PROT 7.1 6.5 7.1  ALBUMIN 4.6 4.0 4.3   Recent Labs    03/01/20 0259 03/01/20 0259 03/13/20 1436 03/13/20 1436 03/13/20 1511 03/15/20 0355 03/24/20 0000  WBC  14.4*   < > 8.9  --   --  6.2 7.3  NEUTROABS 8.1*  --  4.6  --   --   --  3  HGB 14.2   < >  14.8   < > 14.6 13.1 13.4  HCT 41.9   < > 44.8   < > 43.0 40.4 40  MCV 88.0  --  89.6  --   --  90.8  --   PLT 341   < > 381  --   --  253 263   < > = values in this interval not displayed.   Lab Results  Component Value Date   TSH 4.791 (H) 03/13/2020   Lab Results  Component Value Date   HGBA1C 5.9 (H) 03/14/2020   Lab Results  Component Value Date   CHOL 129 03/14/2020   HDL 31 (L) 03/14/2020   LDLCALC 61 03/14/2020   LDLDIRECT 74.0 01/04/2020   TRIG 185 (H) 03/14/2020   CHOLHDL 4.2 03/14/2020    Significant Diagnostic Results in last 30 days:  EEG  Result Date: 03/14/2020 Lora Havens, MD     03/14/2020  2:22 PM Patient Name: SOMTOCHUKWU MAHALA MRN: [id number] Epilepsy Attending: Lora Havens Referring Physician/Provider: Dr Cristal Ford Date: 03/14/2020 Duration: 26.15 minutes Patient history: 74 year old female with medical history of left parafalcine subdural hematoma presented for slurred speech and confusion.  EEG to evaluate for seizures. Level of alertness: Awake AEDs during EEG study: [place] Technical aspects: This EEG study was done with scalp electrodes positioned according to the 10-20 International system of electrode placement. Electrical activity was acquired at a sampling rate of 500Hz  and reviewed with a high frequency filter of 70Hz  and a low frequency filter of 1Hz . EEG data were recorded continuously and digitally stored. Description: The posterior dominant rhythm consists of 9 Hz activity of moderate voltage (25-35 uV) seen predominantly in posterior head regions, symmetric and reactive to eye opening and eye closing.  EEG showed intermittent generalized and maximal left frontotemporal region 2 to 3 Hz delta slowing. Hyperventilation and photic stimulation were not performed. Abnormality -Intermittent slow, generalized and maximal left frontal region IMPRESSION: This  study is suggestive of cortical dysfunction in left frontal region as well as mild diffuse encephalopathy, nonspecific to etiology.  No seizures or epileptiform discharges were seen throughout the recording. Lora Havens   DG Knee 1-2 Views Left  Result Date: 03/02/2020 CLINICAL DATA:  Fall EXAM: LEFT KNEE - 1-2 VIEW COMPARISON:  None. FINDINGS: Chondrocalcinosis with tricompartment degenerative changes. No joint effusion. No acute bony abnormality. Specifically, no fracture, subluxation, or dislocation. IMPRESSION: Degenerative changes.  No acute bony abnormality. Electronically Signed   By: Rolm Baptise M.D.   On: 03/02/2020 02:55   DG Knee 1-2 Views Right  Result Date: 03/02/2020 CLINICAL DATA:  Fall EXAM: RIGHT KNEE - 1-2 VIEW COMPARISON:  None. FINDINGS: Chondrocalcinosis with joint space narrowing and spurring in all 3 compartments. No acute bony abnormality. Specifically, no fracture, subluxation, or dislocation. No joint effusion. IMPRESSION: Mild degenerative changes.  No acute bony abnormality. Electronically Signed   By: Rolm Baptise M.D.   On: 03/02/2020 02:55   CT HEAD WO CONTRAST  Result Date: 03/13/2020 CLINICAL DATA:  Possible stroke. Aphasia with increased respiratory rate. Fall yesterday. EXAM: CT HEAD WITHOUT CONTRAST CT CERVICAL SPINE WITHOUT CONTRAST TECHNIQUE: Multidetector CT imaging of the head and cervical spine was performed following the standard protocol without intravenous contrast. Multiplanar CT image reconstructions of the cervical spine were also generated. COMPARISON:  Head CT 03/01/2020 and cervical spine CT 02/29/2020 FINDINGS: CT HEAD FINDINGS Brain: There is a small residual subdural hematoma along the left aspect of the falx which has  decreased in size and density compared to the prior study and now measures up to 9 mm in thickness (previously 10 mm). There is minimal residual subdural blood layering along the left tentorium. No new intracranial hemorrhage, acute  infarct, mass, or midline shift is identified. Mild cerebral atrophy is within normal limits for Wolfe. Hypodensities in the cerebral white matter bilaterally are nonspecific but compatible with mild chronic small vessel ischemic disease. Vascular: Calcified atherosclerosis at the skull base. No hyperdense vessel. Skull: No fracture or suspicious osseous lesion. Sinuses/Orbits: Paranasal sinuses and mastoid air cells are clear. Bilateral cataract extraction. Other: None. CT CERVICAL SPINE FINDINGS Alignment: Cervical spine straightening. Trace anterolisthesis of C7 on T1, likely degenerative and facet mediated. Skull base and vertebrae: No acute fracture or suspicious osseous lesion. Soft tissues and spinal canal: No prevertebral fluid or swelling. No visible canal hematoma. Disc levels: Unchanged disc degeneration including advanced disc space narrowing, degenerative endplate irregularity, and sclerosis at C5-6 and C6-7. Severe right-sided facet arthrosis at C2-3 and C4-5. Moderate to severe neural foraminal stenosis on the left at C3-4 and C5-6 due to uncovertebral spurring. Upper chest: Clear lung apices. Other: Calcified plaque at the left carotid bifurcation. IMPRESSION: 1. No evidence of acute intracranial abnormality. 2. Decreased size of left parafalcine subdural hematoma. 3. No acute cervical spine fracture. Electronically Signed   By: Logan Bores M.D.   On: 03/13/2020 16:39   CT HEAD WO CONTRAST  Result Date: 03/01/2020 CLINICAL DATA:  Head trauma, intracranial venous injury suspected. Additional history provided: Patient admitted yesterday status post fall, CT showed small falcine subdural hematoma, patient very dizzy today, reports occipital headache EXAM: CT HEAD WITHOUT CONTRAST TECHNIQUE: Contiguous axial images were obtained from the base of the skull through the vertex without intravenous contrast. COMPARISON:  Head CT 02/29/2020 FINDINGS: Brain: An acute subdural hemorrhage layering along the  left aspect of the falx and left tentorium is unchanged as compared to head CT 02/29/2020. The hemorrhage again measures up to 10 mm in greatest thickness (series 3, image 24). No interval hemorrhage is demonstrated. No evidence of intracranial mass. No midline shift. No hydrocephalus. Vascular: No hyperdense vessel.  Atherosclerotic calcifications. Skull: Normal. Negative for fracture or focal lesion. Sinuses/Orbits: Visualized orbits demonstrate no acute abnormality. Trace ethmoid sinus mucosal thickening. No significant mastoid effusion. Other: Redemonstrated left parietal scalp hematoma. There is also a small amount of subcutaneous gas at this site suggestive of laceration. IMPRESSION: Stable acute subdural hemorrhage layering along the left aspect of the falx and left tentorium, again measuring up to 10 mm in thickness. No significant mass effect. Redemonstrated left posterior scalp hematoma/laceration. Electronically Signed   By: Kellie Simmering DO   On: 03/01/2020 08:59   CT HEAD WO CONTRAST  Result Date: 02/29/2020 CLINICAL DATA:  Syncopal episode, hit head EXAM: CT HEAD WITHOUT CONTRAST TECHNIQUE: Contiguous axial images were obtained from the base of the skull through the vertex without intravenous contrast. COMPARISON:  None. FINDINGS: Brain: Acute subdural hematoma is present along the left aspect of the falx measuring up to 10 mm in thickness. There is no significant associated mass effect. Prominence of the ventricles and sulci reflects minor generalized parenchymal volume loss. Gray-white differentiation is preserved. Vascular: No hyperdense vessel or unexpected calcification. Skull: Calvarium is unremarkable. Sinuses/Orbits: No acute finding. Other: Posterior scalp soft tissue swelling. IMPRESSION: Acute left falcine subdural hematoma.  No significant mass effect. These results were called by telephone at the time of interpretation on 02/29/2020 at 5:01 pm  to provider Veryl Speak , who verbally  acknowledged these results. Electronically Signed   By: Macy Mis M.D.   On: 02/29/2020 17:04   CT Cervical Spine Wo Contrast  Result Date: 03/13/2020 CLINICAL DATA:  Possible stroke. Aphasia with increased respiratory rate. Fall yesterday. EXAM: CT HEAD WITHOUT CONTRAST CT CERVICAL SPINE WITHOUT CONTRAST TECHNIQUE: Multidetector CT imaging of the head and cervical spine was performed following the standard protocol without intravenous contrast. Multiplanar CT image reconstructions of the cervical spine were also generated. COMPARISON:  Head CT 03/01/2020 and cervical spine CT 02/29/2020 FINDINGS: CT HEAD FINDINGS Brain: There is a small residual subdural hematoma along the left aspect of the falx which has decreased in size and density compared to the prior study and now measures up to 9 mm in thickness (previously 10 mm). There is minimal residual subdural blood layering along the left tentorium. No new intracranial hemorrhage, acute infarct, mass, or midline shift is identified. Mild cerebral atrophy is within normal limits for Wolfe. Hypodensities in the cerebral white matter bilaterally are nonspecific but compatible with mild chronic small vessel ischemic disease. Vascular: Calcified atherosclerosis at the skull base. No hyperdense vessel. Skull: No fracture or suspicious osseous lesion. Sinuses/Orbits: Paranasal sinuses and mastoid air cells are clear. Bilateral cataract extraction. Other: None. CT CERVICAL SPINE FINDINGS Alignment: Cervical spine straightening. Trace anterolisthesis of C7 on T1, likely degenerative and facet mediated. Skull base and vertebrae: No acute fracture or suspicious osseous lesion. Soft tissues and spinal canal: No prevertebral fluid or swelling. No visible canal hematoma. Disc levels: Unchanged disc degeneration including advanced disc space narrowing, degenerative endplate irregularity, and sclerosis at C5-6 and C6-7. Severe right-sided facet arthrosis at C2-3 and C4-5.  Moderate to severe neural foraminal stenosis on the left at C3-4 and C5-6 due to uncovertebral spurring. Upper chest: Clear lung apices. Other: Calcified plaque at the left carotid bifurcation. IMPRESSION: 1. No evidence of acute intracranial abnormality. 2. Decreased size of left parafalcine subdural hematoma. 3. No acute cervical spine fracture. Electronically Signed   By: Logan Bores M.D.   On: 03/13/2020 16:39   CT Cervical Spine Wo Contrast  Result Date: 02/29/2020 CLINICAL DATA:  Syncope EXAM: CT CERVICAL SPINE WITHOUT CONTRAST TECHNIQUE: Multidetector CT imaging of the cervical spine was performed without intravenous contrast. Multiplanar CT image reconstructions were also generated. COMPARISON:  None. FINDINGS: Alignment: No significant listhesis. Skull base and vertebrae: No acute cervical spine fracture. Vertebral body heights are maintained apart from degenerative endplate irregularity. Soft tissues and spinal canal: No prevertebral fluid or swelling. No visible canal hematoma. Disc levels: Multilevel degenerative changes are present including disc space narrowing, endplate osteophytes, and facet and uncovertebral hypertrophy. Upper chest: Negative. Other: Mild calcified plaque at the left ICA origin. IMPRESSION: No acute cervical spine fracture. Electronically Signed   By: Macy Mis M.D.   On: 02/29/2020 17:07   MR BRAIN WO CONTRAST  Result Date: 03/14/2020 CLINICAL DATA:  Subdural hematoma 2 weeks ago with recent change in mental status. EXAM: MRI HEAD WITHOUT CONTRAST TECHNIQUE: Multiplanar, multiecho pulse sequences of the brain and surrounding structures were obtained without intravenous contrast. COMPARISON:  CT head without contrast 03/13/2020, 03/01/2020, and 02/29/2020. FINDINGS: Brain: The left para falcine subacute subdural hematoma is again noted. Remote lacunar infarcts are present within the internal capsule bilaterally. No acute infarct is present. Scattered subcortical T2  hyperintensities bilaterally are mildly advanced for Wolfe. The ventricles are of normal size. No significant extraaxial fluid collection is present. The internal  auditory canals are within normal limits. The brainstem and cerebellum are within normal limits. Vascular: Flow is present in the major intracranial arteries. Skull and upper cervical spine: The craniocervical junction is normal. Upper cervical spine is within normal limits. Marrow signal is unremarkable. Sinuses/Orbits: The paranasal sinuses and mastoid air cells are clear. Bilateral lens replacements are noted. Globes and orbits are otherwise unremarkable. IMPRESSION: 1. Stable left para falcine subacute subdural hematoma. 2. No acute intracranial abnormality. 3. Scattered subcortical T2 hyperintensities bilaterally are mildly advanced for Wolfe. The finding is nonspecific but can be seen in the setting of chronic microvascular ischemia, a demyelinating process such as multiple sclerosis, vasculitis, complicated migraine headaches, or as the sequelae of a prior infectious or inflammatory process. Electronically Signed   By: San Morelle M.D.   On: 03/14/2020 11:42   DG Chest Portable 1 View  Result Date: 03/13/2020 CLINICAL DATA:  Altered mental status.  Diabetes.  Hyperlipidemia. EXAM: PORTABLE CHEST 1 VIEW COMPARISON:  02/29/2020 FINDINGS: Midline trachea. Borderline cardiomegaly. Atherosclerosis in the transverse aorta. No pleural effusion or pneumothorax. No congestive failure. Low lung volumes with resultant pulmonary interstitial prominence. No lobar consolidation. IMPRESSION: Low lung volumes, without acute disease. Aortic Atherosclerosis (ICD10-I70.0). Electronically Signed   By: Abigail Miyamoto M.D.   On: 03/13/2020 18:25   Portable Chest 1 View  Result Date: 02/29/2020 CLINICAL DATA:  Syncope. EXAM: PORTABLE CHEST 1 VIEW COMPARISON:  January 04, 2020 FINDINGS: The heart size and mediastinal contours are within normal limits. There is  moderate severity calcification of the aortic arch. Both lungs are clear. Degenerative changes are seen involving both shoulders. The visualized skeletal structures are otherwise unremarkable. IMPRESSION: No active disease. Electronically Signed   By: Virgina Norfolk M.D.   On: 02/29/2020 20:52   DG Shoulder Right Portable  Result Date: 02/29/2020 CLINICAL DATA:  Fall in shower. Right shoulder pain. EXAM: PORTABLE RIGHT SHOULDER COMPARISON:  None. FINDINGS: Prominent spurring of the humeral head and glenoid with prominent loss of articular space in the glenohumeral joint compatible with severe osteoarthritis. No fracture or malalignment is identified. Subacromial morphology is type 2 (curved). IMPRESSION: Severe glenohumeral osteoarthritis. No acute findings. Electronically Signed   By: Van Clines M.D.   On: 02/29/2020 18:22   ECHOCARDIOGRAM COMPLETE  Result Date: 03/01/2020    ECHOCARDIOGRAM REPORT   Patient Name:   Alexandra Wolfe Date of Exam: 03/01/2020 Medical Rec #:  [id number]        Height:       66.0 in Accession #:    [id number]       Weight:       243.0 lb Date of Birth:  1946-03-20        BSA:          2.172 m Patient Wolfe:    63 years         BP:           153/83 mmHg Patient Gender: F                HR:           81 bpm. Exam Location:  Inpatient Procedure: 2D Echo Indications:    Syncope R55  History:        Patient has prior history of Echocardiogram examinations, most                 recent 12/16/2013. Risk Factors:Dyslipidemia, Diabetes and Former  Smoker.  Sonographer:    Mikki Santee RDCS (AE) Referring Phys: [id number] [place]  1. Mid and basal posterior lateral wall hypokinesis . Left ventricular ejection fraction, by estimation, is 50 to 55%. The left ventricle has low normal function. The left ventricle demonstrates regional wall motion abnormalities (see scoring diagram/findings for description). Left ventricular diastolic parameters were  normal.  2. Right ventricular systolic function is normal. The right ventricular size is normal.  3. The mitral valve is normal in structure. Trivial mitral valve regurgitation. No evidence of mitral stenosis.  4. The aortic valve was not well visualized. Aortic valve regurgitation is not visualized. No aortic stenosis is present.  5. The inferior vena cava is normal in size with greater than 50% respiratory variability, suggesting right atrial pressure of 3 mmHg. FINDINGS  Left Ventricle: Mid and basal posterior lateral wall hypokinesis. Left ventricular ejection fraction, by estimation, is 50 to 55%. The left ventricle has low normal function. The left ventricle demonstrates regional wall motion abnormalities. The left ventricular internal cavity size was normal in size. There is no left ventricular hypertrophy. Left ventricular diastolic parameters were normal. Right Ventricle: The right ventricular size is normal. No increase in right ventricular wall thickness. Right ventricular systolic function is normal. Left Atrium: Left atrial size was normal in size. Right Atrium: Right atrial size was normal in size. Pericardium: There is no evidence of pericardial effusion. Mitral Valve: The mitral valve is normal in structure. There is mild thickening of the mitral valve leaflet(s). Normal mobility of the mitral valve leaflets. Trivial mitral valve regurgitation. No evidence of mitral valve stenosis. Tricuspid Valve: The tricuspid valve is normal in structure. Tricuspid valve regurgitation is trivial. No evidence of tricuspid stenosis. Aortic Valve: The aortic valve was not well visualized. Aortic valve regurgitation is not visualized. No aortic stenosis is present. Pulmonic Valve: The pulmonic valve was normal in structure. Pulmonic valve regurgitation is not visualized. No evidence of pulmonic stenosis. Aorta: The aortic root is normal in size and structure. Venous: The inferior vena cava is normal in size with  greater than 50% respiratory variability, suggesting right atrial pressure of 3 mmHg. IAS/Shunts: No atrial level shunt detected by color flow Doppler.  LEFT VENTRICLE PLAX 2D LVIDd:         4.30 cm  Diastology LVIDs:         2.50 cm  LV e' lateral:   7.51 cm/s LV PW:         0.90 cm  LV E/e' lateral: 8.0 LV IVS:        0.90 cm  LV e' medial:    6.53 cm/s LVOT diam:     2.20 cm  LV E/e' medial:  9.2 LV SV:         81 LV SV Index:   37 LVOT Area:     3.80 cm  RIGHT VENTRICLE RV S prime:     13.40 cm/s TAPSE (M-mode): 2.0 cm LEFT ATRIUM             Index       RIGHT ATRIUM           Index LA diam:        3.60 cm 1.66 cm/m  RA Area:     11.20 cm LA Vol (A2C):   30.3 ml 13.95 ml/m RA Volume:   24.20 ml  11.14 ml/m LA Vol (A4C):   45.0 ml 20.71 ml/m LA Biplane Vol: 41.0 ml 18.87 ml/m  AORTIC VALVE LVOT Vmax:   94.20 cm/s LVOT Vmean:  66.800 cm/s LVOT VTI:    0.213 m  AORTA Ao Root diam: 3.70 cm MITRAL VALVE MV Area (PHT): 3.60 cm    SHUNTS MV Decel Time: 211 msec    Systemic VTI:  0.21 m MV E velocity: 60.00 cm/s  Systemic Diam: 2.20 cm MV A velocity: 85.30 cm/s MV E/A ratio:  0.70 Jenkins Rouge MD Electronically signed by Jenkins Rouge MD Signature Date/Time: 03/01/2020/11:55:17 AM    Final    DG HIP UNILAT WITH PELVIS 2-3 VIEWS LEFT  Result Date: 03/02/2020 CLINICAL DATA:  Fall EXAM: DG HIP (WITH OR WITHOUT PELVIS) 2-3V LEFT COMPARISON:  None. FINDINGS: Hip joints and SI joints are symmetric and unremarkable. No acute bony abnormality. Specifically, no fracture, subluxation, or dislocation. IMPRESSION: Negative. Electronically Signed   By: Rolm Baptise M.D.   On: 03/02/2020 02:54    Assessment/Plan 1. Subdural hematoma (HCC) -Sustained from a fall, MRI brain without contrast on 03/14/20 showed stable left para falcine subdural hematoma -She was given Keppra for a short period of time and now discontinued -EEG does not show any seizures or epileptiform discharges -Follow-up with neurosurgery, Dr.  Duffy Rhody  2. Type 2 diabetes mellitus with hyperlipidemia (HCC) Lab Results  Component Value Date   HGBA1C 5.9 (H) 03/14/2020   - atorvastatin (LIPITOR) 80 MG tablet; Take 1 tablet (80 mg total) by mouth daily.  Dispense: 30 tablet; Refill: 0 - metFORMIN (GLUCOPHAGE) ER 500 MG tablet; Take 1 tablet (500 mg total) by mouth 2 (two) times daily with a meal.  Dispense: 60 tablet; Refill: 0  3. Hypothyroidism due to acquired atrophy of thyroid Lab Results  Component Value Date   TSH 4.791 (H) 03/13/2020   - NP THYROID 30 MG tablet; Take 1 tablet (30 mg total) by mouth daily.  Dispense: 30 tablet; Refill: 0  4. OAB (overactive bladder) - MYRBETRIQ 50 MG TB24 tablet; Take 1 tablet (50 mg total) by mouth at bedtime.  Dispense: 30 tablet; Refill: 0  5. Essential hypertension - metoprolol succinate (TOPROL-XL) 25 MG 24 hr tablet; TAKE 1 TABLET(25 MG) BY MOUTH DAILY  Dispense: 30 tablet; Refill: 0  6. Seasonal and perennial allergic rhinitis - fluticasone (FLONASE) 50 MCG/ACT nasal spray; Place 2 sprays into both nostrils daily.  Dispense: 16 g; Refill: 0   7. Obstructive sleep apnea  - continue BiPAP at bedtime  8. Primary insomnia - traZODone (DESYREL) 50 MG tablet; TAKE 1 TABLET(50 MG) BY MOUTH AT BEDTIME  Dispense: 30 tablet; Refill: 0  9. Persistent asthma without complication, unspecified asthma severity - albuterol (VENTOLIN HFA) 108 (90 Base) MCG/ACT inhaler; Inhale 2 puffs into the lungs every 6 (six) hours as needed for wheezing or shortness of breath.  Dispense: 18 g; Refill: 0 - budesonide-formoterol (SYMBICORT) 160-4.5 MCG/ACT inhaler; Inhale 2 puffs into the lungs 2 (two) times daily.  Dispense: 6 g; Refill: 0  10. Vertigo - meclizine (ANTIVERT) 25 MG tablet; Take 1 tablet (25 mg total) by mouth 3 (three) times daily as needed for dizziness.  Dispense: 90 tablet; Refill: 0     I have filled out patient's discharge paperwork and e-prescribed medications.  Patient  will have home health PT and OT.  DME provided:  Shower bench  Total discharge time: Greater than 30 minutes Greater than 50% was spent in counseling and coordination of care.  Discharge time involved coordination of the discharge process with Education officer, museum, nursing staff and  therapy department. Medical justification for home health services/DME verified.     Alexandra Age, DNP, FNP-BC [facility] [phone] (Monday-Friday 8:00 a.m. - 5:00 p.m.) [phone] (after hours)

## 2020-03-30 ENCOUNTER — Other Ambulatory Visit: Payer: Self-pay | Admitting: Adult Health

## 2020-03-30 ENCOUNTER — Encounter: Payer: Self-pay | Admitting: Internal Medicine

## 2020-03-30 DIAGNOSIS — R29818 Other symptoms and signs involving the nervous system: Secondary | ICD-10-CM | POA: Insufficient documentation

## 2020-03-30 DIAGNOSIS — J302 Other seasonal allergic rhinitis: Secondary | ICD-10-CM

## 2020-03-30 DIAGNOSIS — E1169 Type 2 diabetes mellitus with other specified complication: Secondary | ICD-10-CM

## 2020-03-30 DIAGNOSIS — R4189 Other symptoms and signs involving cognitive functions and awareness: Secondary | ICD-10-CM | POA: Insufficient documentation

## 2020-03-30 MED ORDER — MYRBETRIQ 50 MG PO TB24: 50.0000 mg | ORAL_TABLET | Freq: Every day | ORAL | 0 refills | Status: DC

## 2020-03-30 MED ORDER — ONDANSETRON HCL 4 MG PO TABS: 4.0000 mg | ORAL_TABLET | Freq: Three times a day (TID) | ORAL | 0 refills | Status: DC | PRN

## 2020-03-30 MED ORDER — ALBUTEROL SULFATE HFA 108 (90 BASE) MCG/ACT IN AERS: 2.0000 | INHALATION_SPRAY | Freq: Four times a day (QID) | RESPIRATORY_TRACT | 0 refills | Status: DC | PRN

## 2020-03-30 MED ORDER — METOPROLOL SUCCINATE ER 25 MG PO TB24: ORAL_TABLET | ORAL | 0 refills | Status: DC

## 2020-03-30 MED ORDER — FLUTICASONE PROPIONATE 50 MCG/ACT NA SUSP: 2.0000 | Freq: Every day | NASAL | 0 refills | Status: DC

## 2020-03-30 MED ORDER — MECLIZINE HCL 25 MG PO TABS: 25.0000 mg | ORAL_TABLET | Freq: Three times a day (TID) | ORAL | 0 refills | Status: DC | PRN

## 2020-03-30 MED ORDER — METFORMIN HCL 500 MG PO TABS: 500.0000 mg | ORAL_TABLET | Freq: Two times a day (BID) | ORAL | 0 refills | Status: DC

## 2020-03-30 MED ORDER — BUDESONIDE-FORMOTEROL FUMARATE 160-4.5 MCG/ACT IN AERO: 2.0000 | INHALATION_SPRAY | Freq: Two times a day (BID) | RESPIRATORY_TRACT | 0 refills | Status: DC

## 2020-03-30 MED ORDER — ATORVASTATIN CALCIUM 80 MG PO TABS: 80.0000 mg | ORAL_TABLET | Freq: Every day | ORAL | 0 refills | Status: DC

## 2020-03-30 MED ORDER — METFORMIN HCL ER (MOD) 500 MG PO TB24: 500.0000 mg | ORAL_TABLET | Freq: Two times a day (BID) | ORAL | 0 refills | Status: DC

## 2020-03-30 MED ORDER — TRAZODONE HCL 50 MG PO TABS: ORAL_TABLET | ORAL | 0 refills | Status: DC

## 2020-03-30 MED ORDER — NP THYROID 30 MG PO TABS: 30.0000 mg | ORAL_TABLET | Freq: Every day | ORAL | 0 refills | Status: DC

## 2020-04-01 ENCOUNTER — Other Ambulatory Visit: Payer: Self-pay | Admitting: Adult Health

## 2020-04-01 DIAGNOSIS — E1169 Type 2 diabetes mellitus with other specified complication: Secondary | ICD-10-CM

## 2020-04-03 ENCOUNTER — Telehealth: Payer: Self-pay | Admitting: Internal Medicine

## 2020-04-03 NOTE — Telephone Encounter (Signed)
Ok for verbals 

## 2020-04-03 NOTE — Telephone Encounter (Signed)
    Roselie Awkward from [facility] calling for verbal order for  Home PT 1 x week for 2 weeks 2x week for 2 weeks 1x week for 2 weeks  Phone [phone]

## 2020-04-04 ENCOUNTER — Other Ambulatory Visit: Payer: Self-pay

## 2020-04-04 ENCOUNTER — Ambulatory Visit (INDEPENDENT_AMBULATORY_CARE_PROVIDER_SITE_OTHER): Payer: Medicare Other | Admitting: Internal Medicine

## 2020-04-04 ENCOUNTER — Encounter: Payer: Self-pay | Admitting: Internal Medicine

## 2020-04-04 VITALS — BP 120/72 | HR 89 | Temp 98.2°F | Ht 66.0 in

## 2020-04-04 DIAGNOSIS — E538 Deficiency of other specified B group vitamins: Secondary | ICD-10-CM

## 2020-04-04 DIAGNOSIS — E119 Type 2 diabetes mellitus without complications: Secondary | ICD-10-CM | POA: Diagnosis not present

## 2020-04-04 DIAGNOSIS — R0609 Other forms of dyspnea: Secondary | ICD-10-CM

## 2020-04-04 DIAGNOSIS — R32 Unspecified urinary incontinence: Secondary | ICD-10-CM | POA: Diagnosis not present

## 2020-04-04 DIAGNOSIS — E559 Vitamin D deficiency, unspecified: Secondary | ICD-10-CM

## 2020-04-04 DIAGNOSIS — S065XAA Traumatic subdural hemorrhage with loss of consciousness status unknown, initial encounter: Secondary | ICD-10-CM

## 2020-04-04 DIAGNOSIS — S065X9A Traumatic subdural hemorrhage with loss of consciousness of unspecified duration, initial encounter: Secondary | ICD-10-CM

## 2020-04-04 DIAGNOSIS — R42 Dizziness and giddiness: Secondary | ICD-10-CM

## 2020-04-04 LAB — VITAMIN D 25 HYDROXY (VIT D DEFICIENCY, FRACTURES): VITD: 36.69 ng/mL (ref 30.00–100.00)

## 2020-04-04 LAB — VITAMIN B12: Vitamin B-12: 492 pg/mL (ref 211–911)

## 2020-04-04 LAB — BASIC METABOLIC PANEL
BUN: 16 mg/dL (ref 6–23)
CO2: 22 mEq/L (ref 19–32)
Calcium: 10.5 mg/dL (ref 8.4–10.5)
Chloride: 101 mEq/L (ref 96–112)
Creatinine, Ser: 0.73 mg/dL (ref 0.40–1.20)
GFR: 77.83 mL/min (ref >60.00)
Glucose, Bld: 115 mg/dL — ABNORMAL HIGH (ref 70–99)
Potassium: 4 mEq/L (ref 3.5–5.1)
Sodium: 135 mEq/L (ref 135–145)

## 2020-04-04 LAB — CBC WITH DIFFERENTIAL/PLATELET
Basophils Absolute: 0.1 10*3/uL (ref 0.0–0.1)
Basophils Relative: 1.3 % (ref 0.0–3.0)
Eosinophils Absolute: 0.2 10*3/uL (ref 0.0–0.7)
Eosinophils Relative: 1.8 % (ref 0.0–5.0)
HCT: 42.3 % (ref 36.0–46.0)
Hemoglobin: 14.2 g/dL (ref 12.0–15.0)
Lymphocytes Relative: 35.6 % (ref 12.0–46.0)
Lymphs Abs: 3.9 10*3/uL (ref 0.7–4.0)
MCHC: 33.6 g/dL (ref 30.0–36.0)
MCV: 88.5 fl (ref 78.0–100.0)
Monocytes Absolute: 1 10*3/uL (ref 0.1–1.0)
Monocytes Relative: 9.3 % (ref 3.0–12.0)
Neutro Abs: 5.6 10*3/uL (ref 1.4–7.7)
Neutrophils Relative %: 52 % (ref 43.0–77.0)
Platelets: 384 10*3/uL (ref 150.0–400.0)
RBC: 4.78 Mil/uL (ref 3.87–5.11)
RDW: 14.1 % (ref 11.5–15.5)
WBC: 10.9 10*3/uL — ABNORMAL HIGH (ref 4.0–10.5)

## 2020-04-04 LAB — LIPID PANEL
Cholesterol: 121 mg/dL (ref 0–200)
HDL: 37.7 mg/dL — ABNORMAL LOW (ref >39.00)
LDL Cholesterol: 48 mg/dL (ref 0–99)
NonHDL: 82.96
Total CHOL/HDL Ratio: 3
Triglycerides: 176 mg/dL — ABNORMAL HIGH (ref 0.0–149.0)
VLDL: 35.2 mg/dL (ref 0.0–40.0)

## 2020-04-04 LAB — HEPATIC FUNCTION PANEL
ALT: 30 U/L (ref 0–35)
AST: 24 U/L (ref 0–37)
Albumin: 4.5 g/dL (ref 3.5–5.2)
Alkaline Phosphatase: 85 U/L (ref 39–117)
Bilirubin, Direct: 0.2 mg/dL (ref 0.0–0.3)
Total Bilirubin: 0.7 mg/dL (ref 0.2–1.2)
Total Protein: 7.3 g/dL (ref 6.0–8.3)

## 2020-04-04 LAB — HEMOGLOBIN A1C: Hgb A1c MFr Bld: 6.1 % (ref 4.6–6.5)

## 2020-04-04 LAB — TSH: TSH: 3.93 u[IU]/mL (ref 0.35–4.50)

## 2020-04-04 NOTE — Assessment & Plan Note (Signed)
For neurology f/u next wk, conts no driving

## 2020-04-04 NOTE — Assessment & Plan Note (Signed)
With chronic pursed lip breathing x yrs unexplained,  to f/u any worsening symptoms or concerns

## 2020-04-04 NOTE — Assessment & Plan Note (Addendum)
stable overall by history and exam, recent data reviewed with pt, and pt to continue medical treatment as before,  to f/u any worsening symptoms or concerns  I spent 32 minutes in preparing to see the patient by review of recent labs, imaging and procedures, obtaining and reviewing separately obtained history, communicating with the patient and family or caregiver, ordering medications, tests or procedures, and documenting clinical information in the EHR including the differential Dx, treatment, and any further evaluation and other management of DM, dyspnea,  Vertigo, urinary incontinence, SDH

## 2020-04-04 NOTE — Progress Notes (Signed)
Subjective:    Patient ID: Alexandra Wolfe, female    DOB: [birth year], 74 y.o.   MRN: [id number]  HPI  Pt here to f/u with son -  She was admitted to [facility] on 03/16/20 post hospitalization 03/13/20 to 03/16/20 for altered mental status. Of note, she was recently hospitalized on 3/23 to 3/25 for subdural hematoma and was also found to have BPPV. She was discharged home with Home health services. CT of the head was unremarkable and negative for acute intracranial abnormalities. EEG did not show any seizures or epileptiform discharges. MRI of the brain was negative for acute stroke. No new recommendations were given. She has PMH of diabetes mellitus, colonic polyps, SVT, hyperlipidemia and low back pain. Pt has no new complaints, but realizes she needs assisted living, now s/p physical therapy.  Walks with walker, no falls.  Followed by Luc[facility] Shutters and conts to have ongoing pursed lip breathing of apparent no know signfiicance.  Also has ongoing urinary incontinence wearing briefs; Denies urinary symptoms such as dysuria, frequency, urgency, flank pain, hematuria or n/v, fever, chills.  Pt denies chest pain, increased sob or doe, wheezing, orthopnea, PND, increased LE swelling, palpitations, dizziness or syncope.   Pt denies polydipsia, polyuria.  Has some ongoing off balance but no worsening dizziness Past Medical History:  Diagnosis Date  . ALLERGIC RHINITIS 04/02/2009   no per pt  . Anxiety   . BACK PAIN 09/27/2008  . BUNIONS, BILATERAL 12/23/2007  . CHEST DISCOMFORT, ATYPICAL 11/07/2009  . Chronic LBP   . COLONIC POLYPS, HX OF 09/13/2007  . CONSTIPATION 09/27/2008  . DEPRESSION 09/13/2007  . Diabetes mellitus    diet controlled  . DYSPNEA ON EXERTION 02/06/2010  . Eustachian tube dysfunction 05/20/2011  . FOOT PAIN, RIGHT 09/27/2008  . HYPERLIPIDEMIA 03/11/2008  . LBP (low back pain) 05/20/2011  . Leukocytosis 11/20/2011  . OSTEOARTHROSIS NOS, LOWER LEG 09/13/2007  .  SHOULDER PAIN, LEFT 02/14/2009  . SVT (supraventricular tachycardia) ([place])   . SYMPTOM, PALPITATIONS 09/13/2007  . URINARY INCONTINENCE 08/23/2009  . Vertigo 05/20/2011   Past Surgical History:  Procedure Laterality Date  . BUNIONECTOMY     right  . ccx    . CHOLECYSTECTOMY    . LUMBAR LAMINECTOMY    . LUMBAR LAMINECTOMY    . SHOULDER ARTHROSCOPY  12/13/2011   Procedure: ARTHROSCOPY SHOULDER;  Surgeon: Augustin Schooling;  Location: [facility];  Service: Orthopedics;  Laterality: Left;  Left Shoulder ArthroscopyDebridement Limited Tenodesis Open Rotator Cuff Repair Spur Removal Right Shoulder Injection     reports that she has quit smoking. She has a 10.00 pack-year smoking history. She has never used smokeless tobacco. She reports that she does not drink alcohol or use drugs. family history includes Colon cancer in her mother; Diabetes in an other family member; Diabetes type II in her father; Heart disease in her brother and father. Allergies  Allergen Reactions  . Aripiprazole     REACTION: agitation  . Simvastatin     REACTION: myalgia   Current Outpatient Medications on File Prior to Visit  Medication Sig Dispense Refill  . acetaminophen (TYLENOL) 325 MG tablet Take 2 tablets (650 mg total) by mouth every 6 (six) hours as needed for mild pain or headache.    . albuterol (VENTOLIN HFA) 108 (90 Base) MCG/ACT inhaler Inhale 2 puffs into the lungs every 6 (six) hours as needed for wheezing or shortness of breath. 18 g 0  . atorvastatin (  LIPITOR) 80 MG tablet Take 1 tablet (80 mg total) by mouth daily. 30 tablet 0  . budesonide-formoterol (SYMBICORT) 160-4.5 MCG/ACT inhaler Inhale 2 puffs into the lungs 2 (two) times daily. 6 g 0  . Calcium Carbonate (CALCIUM 500 PO) Take 1,000 mg by mouth 2 (two) times daily.     . cetirizine (ZYRTEC) 10 MG tablet Take 1 tablet (10 mg total) by mouth daily. 30 tablet 11  . diclofenac (VOLTAREN) 75 MG EC tablet Take 75 mg by mouth 2 (two) times daily.    .  fluticasone (FLONASE) 50 MCG/ACT nasal spray Place 2 sprays into both nostrils daily. 16 g 0  . meclizine (ANTIVERT) 25 MG tablet Take 1 tablet (25 mg total) by mouth 3 (three) times daily as needed for dizziness. 90 tablet 0  . metFORMIN (GLUCOPHAGE) 500 MG tablet Take 500 mg by mouth 2 (two) times daily.    . metFORMIN (GLUMETZA) 500 MG (MOD) 24 hr tablet Take 1 tablet (500 mg total) by mouth 2 (two) times daily with a meal. 60 tablet 0  . metoprolol succinate (TOPROL-XL) 25 MG 24 hr tablet TAKE 1 TABLET(25 MG) BY MOUTH DAILY 30 tablet 0  . Multiple Vitamin (MULTIVITAMIN WITH MINERALS) TABS tablet Take 1 tablet by mouth daily.    [name] MYRBETRIQ 50 MG TB24 tablet Take 1 tablet (50 mg total) by mouth at bedtime. 30 tablet 0  . NON FORMULARY Heart healthy lcs diet    . NP THYROID 30 MG tablet Take 1 tablet (30 mg total) by mouth daily. 30 tablet 0  . ondansetron (ZOFRAN) 4 MG tablet Take 1 tablet (4 mg total) by mouth every 8 (eight) hours as needed for nausea or vomiting. 30 tablet 0  . senna-docusate (SENOKOT-S) 8.6-50 MG tablet Take 1 tablet by mouth at bedtime as needed for mild constipation.    . traZODone (DESYREL) 50 MG tablet TAKE 1 TABLET(50 MG) BY MOUTH AT BEDTIME 30 tablet 0   No current facility-administered medications on file prior to visit.   Review of Systems All otherwise neg per pt     Objective:   Physical Exam BP 120/72 (BP Location: Left Arm, Patient Position: Sitting, Cuff Size: Large)   Pulse 89   Temp 98.2 F (36.8 C) (Oral)   Ht 5\' 6"  (1.676 m)   SpO2 97%   BMI 38.54 kg/m  VS noted,  Constitutional: Pt appears in NAD HENT: Head: NCAT.  Right Ear: External ear normal.  Left Ear: External ear normal.  Eyes: . Pupils are equal, round, and reactive to light. Conjunctivae and EOM are normal Nose: without d/c or deformity Neck: Neck supple. Gross normal ROM Cardiovascular: Normal rate and regular rhythm.   Pulmonary/Chest: Effort normal and breath sounds without  rales or wheezing.  Abd:  Soft, NT, ND, + BS, no organomegaly Neurological: Pt is alert. At baseline orientation, motor grossly intact Skin: Skin is warm. No rashes, other new lesions, no LE edema Psychiatric: Pt behavior is normal without agitation  All otherwise neg per pt Lab Results  Component Value Date   WBC 10.9 (H) 04/04/2020   HGB 14.2 04/04/2020   HCT 42.3 04/04/2020   PLT 384.0 04/04/2020   GLUCOSE 115 (H) 04/04/2020   CHOL 121 04/04/2020   TRIG 176.0 (H) 04/04/2020   HDL 37.70 (L) 04/04/2020   LDLDIRECT 74.0 01/04/2020   LDLCALC 48 04/04/2020   ALT 30 04/04/2020   AST 24 04/04/2020   NA 135 04/04/2020   K  4.0 04/04/2020   CL 101 04/04/2020   CREATININE 0.73 04/04/2020   BUN 16 04/04/2020   CO2 22 04/04/2020   TSH 3.93 04/04/2020   INR 1.0 03/13/2020   HGBA1C 6.1 04/04/2020   MICROALBUR <0.7 07/01/2019      Assessment & Plan:

## 2020-04-04 NOTE — Assessment & Plan Note (Signed)
For UA but exam o/w benign,  to f/u any worsening symptoms or concerns

## 2020-04-04 NOTE — Telephone Encounter (Signed)
Spoke with Roselie Awkward informed him of verbal ok for Home PT 1 x week for 2 weeks 2x week for 2 weeks 1x week for 2 weeks

## 2020-04-04 NOTE — Assessment & Plan Note (Signed)
Improved,  to f/u any worsening symptoms or concerns 

## 2020-04-04 NOTE — Patient Instructions (Addendum)
Please continue all other medications as before, and refills have been done if requested.  Please have the pharmacy call with any other refills you may need.  Please continue your efforts at being more active, low cholesterol diet, and weight control.  You are otherwise up to date with prevention measures today.  Please keep your appointments with your specialists as you may have planned  Please go to the LAB at the blood drawing area for the tests to be done  You will be contacted by phone if any changes need to be made immediately.  Otherwise, you will receive a letter about your results with an explanation, but please check with MyChart first.  Please remember to sign up for MyChart if you have not done so, as this will be important to you in the future with finding out test results, communicating by private email, and scheduling acute appointments online when needed.  Please make an Appointment to return in July 28, or sooner if needed

## 2020-04-05 ENCOUNTER — Telehealth: Payer: Self-pay

## 2020-04-05 ENCOUNTER — Telehealth: Payer: Self-pay | Admitting: Internal Medicine

## 2020-04-05 ENCOUNTER — Other Ambulatory Visit: Payer: Self-pay

## 2020-04-05 DIAGNOSIS — J309 Allergic rhinitis, unspecified: Secondary | ICD-10-CM

## 2020-04-05 MED ORDER — CETIRIZINE HCL 10 MG PO TABS: 10.0000 mg | ORAL_TABLET | Freq: Every day | ORAL | 11 refills | Status: DC

## 2020-04-05 NOTE — Telephone Encounter (Signed)
Ok for verbal 

## 2020-04-05 NOTE — Telephone Encounter (Signed)
[facility] called back, Informed of message below.  She stated patient just needs a rx for the keppra, she not longer has any.

## 2020-04-05 NOTE — Telephone Encounter (Signed)
Also requesting Nursing for bathing. 2 x 3

## 2020-04-05 NOTE — Telephone Encounter (Signed)
Clair Gulling, physical therapist with [facility] calling and is requesting Verbal Orders for medical social worker evaluation for community resources. Please advise.  CB#: [phone] (can leave a voicemail)

## 2020-04-05 NOTE — Telephone Encounter (Signed)
  [facility] Lemmings calling to discuss current medication list Please call [phone]

## 2020-04-05 NOTE — Telephone Encounter (Signed)
Ok to restart the nsaid for pain, and continue the keppra

## 2020-04-06 ENCOUNTER — Other Ambulatory Visit: Payer: Self-pay | Admitting: Adult Health

## 2020-04-06 DIAGNOSIS — I10 Essential (primary) hypertension: Secondary | ICD-10-CM

## 2020-04-06 MED ORDER — LEVETIRACETAM 500 MG PO TABS: 500.0000 mg | ORAL_TABLET | Freq: Two times a day (BID) | ORAL | 1 refills | Status: DC

## 2020-04-06 NOTE — Telephone Encounter (Signed)
Ok this is done erx 

## 2020-04-07 DIAGNOSIS — H8110 Benign paroxysmal vertigo, unspecified ear: Secondary | ICD-10-CM | POA: Diagnosis not present

## 2020-04-07 DIAGNOSIS — E1122 Type 2 diabetes mellitus with diabetic chronic kidney disease: Secondary | ICD-10-CM | POA: Diagnosis not present

## 2020-04-07 DIAGNOSIS — G8929 Other chronic pain: Secondary | ICD-10-CM | POA: Diagnosis not present

## 2020-04-07 DIAGNOSIS — M2578 Osteophyte, vertebrae: Secondary | ICD-10-CM | POA: Diagnosis not present

## 2020-04-07 DIAGNOSIS — G9340 Encephalopathy, unspecified: Secondary | ICD-10-CM | POA: Diagnosis not present

## 2020-04-07 DIAGNOSIS — I081 Rheumatic disorders of both mitral and tricuspid valves: Secondary | ICD-10-CM | POA: Diagnosis not present

## 2020-04-07 DIAGNOSIS — E039 Hypothyroidism, unspecified: Secondary | ICD-10-CM | POA: Diagnosis not present

## 2020-04-07 DIAGNOSIS — M19011 Primary osteoarthritis, right shoulder: Secondary | ICD-10-CM | POA: Diagnosis not present

## 2020-04-07 DIAGNOSIS — M170 Bilateral primary osteoarthritis of knee: Secondary | ICD-10-CM | POA: Diagnosis not present

## 2020-04-07 DIAGNOSIS — M4802 Spinal stenosis, cervical region: Secondary | ICD-10-CM | POA: Diagnosis not present

## 2020-04-07 DIAGNOSIS — J45909 Unspecified asthma, uncomplicated: Secondary | ICD-10-CM | POA: Diagnosis not present

## 2020-04-07 DIAGNOSIS — M47812 Spondylosis without myelopathy or radiculopathy, cervical region: Secondary | ICD-10-CM | POA: Diagnosis not present

## 2020-04-07 DIAGNOSIS — I471 Supraventricular tachycardia: Secondary | ICD-10-CM | POA: Diagnosis not present

## 2020-04-07 DIAGNOSIS — I129 Hypertensive chronic kidney disease with stage 1 through stage 4 chronic kidney disease, or unspecified chronic kidney disease: Secondary | ICD-10-CM | POA: Diagnosis not present

## 2020-04-07 DIAGNOSIS — S065X0D Traumatic subdural hemorrhage without loss of consciousness, subsequent encounter: Secondary | ICD-10-CM | POA: Diagnosis not present

## 2020-04-07 DIAGNOSIS — N1831 Chronic kidney disease, stage 3a: Secondary | ICD-10-CM | POA: Diagnosis not present

## 2020-04-10 DIAGNOSIS — S069X1D Unspecified intracranial injury with loss of consciousness of 30 minutes or less, subsequent encounter: Secondary | ICD-10-CM | POA: Diagnosis not present

## 2020-04-10 DIAGNOSIS — S069XAA Unspecified intracranial injury with loss of consciousness status unknown, initial encounter: Secondary | ICD-10-CM | POA: Insufficient documentation

## 2020-04-10 DIAGNOSIS — Z6837 Body mass index (BMI) 37.0-37.9, adult: Secondary | ICD-10-CM | POA: Insufficient documentation

## 2020-04-10 DIAGNOSIS — S069X9A Unspecified intracranial injury with loss of consciousness of unspecified duration, initial encounter: Secondary | ICD-10-CM | POA: Insufficient documentation

## 2020-04-12 ENCOUNTER — Telehealth: Payer: Self-pay | Admitting: Internal Medicine

## 2020-04-12 ENCOUNTER — Telehealth: Payer: Self-pay

## 2020-04-12 NOTE — Telephone Encounter (Signed)
Ok for verbals 

## 2020-04-12 NOTE — Telephone Encounter (Signed)
New message:   Glenard Haring is calling from [place] and is wanting to report pt's blood sugar of 181. Pt had ate a sandwich but had not taken her metformin. After while the pt did take her medication and then her sugar was 161. Glenard Haring would like a verbal order for a UANC for 04/14/20 due to the patient feeling pressure when urinating. Please advise.

## 2020-04-12 NOTE — Telephone Encounter (Signed)
Called Clair Gulling there was no answer LMOM w/MD response.Marland KitchenJohny Chess

## 2020-04-12 NOTE — Telephone Encounter (Signed)
Requesting verbal orders for Speech theraphy for swallow. States that patient had a recent choking experience and that she has these issues chronically.  CB#: [phone] (can leave a voicemail)

## 2020-04-13 NOTE — Telephone Encounter (Signed)
Sugar noted  Ok for verbal

## 2020-04-13 NOTE — Telephone Encounter (Signed)
Called and left message for [place] today.

## 2020-04-14 ENCOUNTER — Telehealth: Payer: Self-pay

## 2020-04-14 NOTE — Telephone Encounter (Signed)
New message    Home health calling asking can the blood pressure  parameter be change asking for a verbal order  Systolic over 0000000  diastolic below 60 above  95   Blood pressure today 142/82

## 2020-04-17 NOTE — Telephone Encounter (Signed)
[place] for change in parameters

## 2020-04-19 ENCOUNTER — Telehealth: Payer: Self-pay | Admitting: Internal Medicine

## 2020-04-19 ENCOUNTER — Other Ambulatory Visit: Payer: Medicare Other

## 2020-04-19 NOTE — Telephone Encounter (Signed)
Patient exposed to covid, has symptoms, unable to get a covid test due to transportation. Physical Therapy postponed until next week because patient is in quarantine

## 2020-04-19 NOTE — Telephone Encounter (Signed)
[facility] RN called to speak to RN in office.  Explained pt situation, symptoms, VS.  Dr Jenny Reichmann updated on call & orders rec'd to advise pt to get tested & possibly schedule appt for pt for today.  This RN then spoke with Glenard Haring (pt could be heard in background responding to nurse) advising pt to get tested today.  Pt states that son wants to know rationale & does not want to take patient unless necessary.  Explained to patient that for peace of mind, since she called in to report her concerns, she should make the appt; however, if she chose not to get tested, then she needed to go to ED if difficulty breathing or symptoms became worse.  Pt advised that appt for COVID test made for today at 2pm at the [facility].  Pt verb understanding of all information given.  1236: Dr Jenny Reichmann made aware of conversation with patient & wants pt to go to urgent care or ED due to symptom of fever.  Pt called back to inform her of Dr Judi Cong advisement.  Pt states that she doesn't know how she is going to get to urgent care, but will try to find a ride.  Pt advised to call 911 if increased weakness, difficulty breathing.  Pt verb understanding.

## 2020-04-19 NOTE — Telephone Encounter (Signed)
    Alexandra Wolfe from [facility] is currently at patients home. States patient was exposed to someone with Covid Today the patient has temp of 102, Chills,weakness, fatigue, and Increased SOB  Call transferred to [facility] nurse

## 2020-04-25 ENCOUNTER — Telehealth: Payer: Self-pay

## 2020-04-25 ENCOUNTER — Other Ambulatory Visit: Payer: Self-pay | Admitting: Adult Health

## 2020-04-25 DIAGNOSIS — E034 Atrophy of thyroid (acquired): Secondary | ICD-10-CM

## 2020-04-25 NOTE — Telephone Encounter (Signed)
New message   [facility] calling from the patient home  Patient C/o dizziness, fatigue, and N/V and diarrhea  No smartphone on a flip phone.   COVID vaccine complete

## 2020-04-26 ENCOUNTER — Telehealth: Payer: Self-pay

## 2020-04-26 NOTE — Telephone Encounter (Signed)
Jim with [facility] calling and states that he was to see the patient this week. Patient requesting to skip this week and he will see her next week. States that last week patient had an exposure to covid and yesterday was having diarrhea that is continuing today. 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 She has taken her meclizine but cannot find nausea meds. Please advise.  CB#:[phone] (can leave a voicemail)

## 2020-04-27 ENCOUNTER — Other Ambulatory Visit: Payer: Self-pay | Admitting: Adult Health

## 2020-04-27 DIAGNOSIS — J45909 Unspecified asthma, uncomplicated: Secondary | ICD-10-CM

## 2020-04-27 DIAGNOSIS — J302 Other seasonal allergic rhinitis: Secondary | ICD-10-CM

## 2020-04-27 MED ORDER — ONDANSETRON HCL 4 MG PO TABS: 4.0000 mg | ORAL_TABLET | Freq: Three times a day (TID) | ORAL | 0 refills | Status: DC | PRN

## 2020-04-27 NOTE — Telephone Encounter (Signed)
zofran done erx

## 2020-04-28 NOTE — Telephone Encounter (Signed)
Notified pt MD sent zofran to [facility].Marland KitchenJohny Chess

## 2020-05-04 ENCOUNTER — Telehealth: Payer: Self-pay

## 2020-05-04 NOTE — Telephone Encounter (Signed)
New message   Clair Gulling with [facility] calling for verbal orders and update on patient condition   Verbal orders: PT recertification two-week x 2 & One-week x 7    Update:   B/p  sitting  140/80, standing 180/98, the patient was asymptomatic  The patient blood pressure continues upon standing with exertion  The patient feels normal does not think b/p is elevated.   Report left upper quad pain that chronic pain level between  4-10             C/o Numbness in  Both feet h/o DM               Today blood sugar  180

## 2020-05-04 NOTE — Telephone Encounter (Signed)
Ok for verbals 

## 2020-05-09 ENCOUNTER — Encounter (HOSPITAL_COMMUNITY): Payer: Self-pay | Admitting: Emergency Medicine

## 2020-05-09 ENCOUNTER — Other Ambulatory Visit: Payer: Self-pay

## 2020-05-09 ENCOUNTER — Emergency Department (HOSPITAL_COMMUNITY): Payer: Medicare Other

## 2020-05-09 ENCOUNTER — Emergency Department (HOSPITAL_COMMUNITY)
Admission: EM | Admit: 2020-05-09 | Discharge: 2020-05-09 | Disposition: A | Discharge status: Home / Self Care | Payer: Medicare Other | Attending: Emergency Medicine | Admitting: Emergency Medicine

## 2020-05-09 DIAGNOSIS — R4701 Aphasia: Secondary | ICD-10-CM

## 2020-05-09 DIAGNOSIS — I10 Essential (primary) hypertension: Secondary | ICD-10-CM | POA: Diagnosis not present

## 2020-05-09 DIAGNOSIS — Z7984 Long term (current) use of oral hypoglycemic drugs: Secondary | ICD-10-CM | POA: Insufficient documentation

## 2020-05-09 DIAGNOSIS — J45909 Unspecified asthma, uncomplicated: Secondary | ICD-10-CM | POA: Diagnosis not present

## 2020-05-09 DIAGNOSIS — N1831 Chronic kidney disease, stage 3a: Secondary | ICD-10-CM | POA: Diagnosis not present

## 2020-05-09 DIAGNOSIS — R0902 Hypoxemia: Secondary | ICD-10-CM | POA: Diagnosis not present

## 2020-05-09 DIAGNOSIS — W19XXXA Unspecified fall, initial encounter: Secondary | ICD-10-CM | POA: Diagnosis not present

## 2020-05-09 DIAGNOSIS — M2578 Osteophyte, vertebrae: Secondary | ICD-10-CM | POA: Diagnosis not present

## 2020-05-09 DIAGNOSIS — M19011 Primary osteoarthritis, right shoulder: Secondary | ICD-10-CM | POA: Diagnosis not present

## 2020-05-09 DIAGNOSIS — R479 Unspecified speech disturbances: Secondary | ICD-10-CM | POA: Diagnosis not present

## 2020-05-09 DIAGNOSIS — S065X0D Traumatic subdural hemorrhage without loss of consciousness, subsequent encounter: Secondary | ICD-10-CM | POA: Diagnosis not present

## 2020-05-09 DIAGNOSIS — R9431 Abnormal electrocardiogram [ECG] [EKG]: Secondary | ICD-10-CM | POA: Diagnosis not present

## 2020-05-09 DIAGNOSIS — I081 Rheumatic disorders of both mitral and tricuspid valves: Secondary | ICD-10-CM | POA: Diagnosis not present

## 2020-05-09 DIAGNOSIS — I129 Hypertensive chronic kidney disease with stage 1 through stage 4 chronic kidney disease, or unspecified chronic kidney disease: Secondary | ICD-10-CM | POA: Diagnosis not present

## 2020-05-09 DIAGNOSIS — M170 Bilateral primary osteoarthritis of knee: Secondary | ICD-10-CM | POA: Diagnosis not present

## 2020-05-09 DIAGNOSIS — Z87891 Personal history of nicotine dependence: Secondary | ICD-10-CM | POA: Diagnosis not present

## 2020-05-09 DIAGNOSIS — E039 Hypothyroidism, unspecified: Secondary | ICD-10-CM | POA: Diagnosis not present

## 2020-05-09 DIAGNOSIS — Z79899 Other long term (current) drug therapy: Secondary | ICD-10-CM | POA: Insufficient documentation

## 2020-05-09 DIAGNOSIS — G8929 Other chronic pain: Secondary | ICD-10-CM | POA: Diagnosis not present

## 2020-05-09 DIAGNOSIS — R531 Weakness: Secondary | ICD-10-CM | POA: Insufficient documentation

## 2020-05-09 DIAGNOSIS — H8110 Benign paroxysmal vertigo, unspecified ear: Secondary | ICD-10-CM | POA: Diagnosis not present

## 2020-05-09 DIAGNOSIS — E119 Type 2 diabetes mellitus without complications: Secondary | ICD-10-CM | POA: Diagnosis not present

## 2020-05-09 DIAGNOSIS — M4802 Spinal stenosis, cervical region: Secondary | ICD-10-CM | POA: Diagnosis not present

## 2020-05-09 DIAGNOSIS — R4781 Slurred speech: Secondary | ICD-10-CM | POA: Diagnosis not present

## 2020-05-09 DIAGNOSIS — I700 Atherosclerosis of aorta: Secondary | ICD-10-CM | POA: Diagnosis not present

## 2020-05-09 DIAGNOSIS — I471 Supraventricular tachycardia: Secondary | ICD-10-CM | POA: Diagnosis not present

## 2020-05-09 DIAGNOSIS — M47812 Spondylosis without myelopathy or radiculopathy, cervical region: Secondary | ICD-10-CM | POA: Diagnosis not present

## 2020-05-09 DIAGNOSIS — E1122 Type 2 diabetes mellitus with diabetic chronic kidney disease: Secondary | ICD-10-CM | POA: Diagnosis not present

## 2020-05-09 LAB — URINALYSIS, ROUTINE W REFLEX MICROSCOPIC
Bilirubin Urine: NEGATIVE
Glucose, UA: NEGATIVE mg/dL
Hgb urine dipstick: NEGATIVE
Ketones, ur: NEGATIVE mg/dL
Nitrite: NEGATIVE
Protein, ur: NEGATIVE mg/dL
Specific Gravity, Urine: 1.004 — ABNORMAL LOW (ref 1.005–1.030)
pH: 7 (ref 5.0–8.0)

## 2020-05-09 LAB — DIFFERENTIAL
Abs Immature Granulocytes: 0.04 10*3/uL (ref 0.00–0.07)
Basophils Absolute: 0.1 10*3/uL (ref 0.0–0.1)
Basophils Relative: 1 %
Eosinophils Absolute: 0.3 10*3/uL (ref 0.0–0.5)
Eosinophils Relative: 2 %
Immature Granulocytes: 0 %
Lymphocytes Relative: 35 %
Lymphs Abs: 4.7 10*3/uL — ABNORMAL HIGH (ref 0.7–4.0)
Monocytes Absolute: 0.8 10*3/uL (ref 0.1–1.0)
Monocytes Relative: 6 %
Neutro Abs: 7.5 10*3/uL (ref 1.7–7.7)
Neutrophils Relative %: 56 %

## 2020-05-09 LAB — RAPID URINE DRUG SCREEN, HOSP PERFORMED
Amphetamines: NOT DETECTED
Barbiturates: NOT DETECTED
Benzodiazepines: NOT DETECTED
Cocaine: NOT DETECTED
Opiates: NOT DETECTED
Tetrahydrocannabinol: NOT DETECTED

## 2020-05-09 LAB — COMPREHENSIVE METABOLIC PANEL
ALT: 21 U/L (ref 0–44)
AST: 27 U/L (ref 15–41)
Albumin: 4.3 g/dL (ref 3.5–5.0)
Alkaline Phosphatase: 59 U/L (ref 38–126)
Anion gap: 17 — ABNORMAL HIGH (ref 5–15)
BUN: 14 mg/dL (ref 8–23)
CO2: 18 mmol/L — ABNORMAL LOW (ref 22–32)
Calcium: 9.9 mg/dL (ref 8.9–10.3)
Chloride: 103 mmol/L (ref 98–111)
Creatinine, Ser: 0.81 mg/dL (ref 0.44–1.00)
GFR calc Af Amer: >60 mL/min (ref >60)
GFR calc non Af Amer: >60 mL/min (ref >60)
Glucose, Bld: 132 mg/dL — ABNORMAL HIGH (ref 70–99)
Potassium: 4.2 mmol/L (ref 3.5–5.1)
Sodium: 138 mmol/L (ref 135–145)
Total Bilirubin: 0.7 mg/dL (ref 0.3–1.2)
Total Protein: 7 g/dL (ref 6.5–8.1)

## 2020-05-09 LAB — CBG MONITORING, ED: Glucose-Capillary: 128 mg/dL — ABNORMAL HIGH (ref 70–99)

## 2020-05-09 LAB — CBC
HCT: 43.4 % (ref 36.0–46.0)
Hemoglobin: 14.6 g/dL (ref 12.0–15.0)
MCH: 30.1 pg (ref 26.0–34.0)
MCHC: 33.6 g/dL (ref 30.0–36.0)
MCV: 89.5 fL (ref 80.0–100.0)
Platelets: 352 10*3/uL (ref 150–400)
RBC: 4.85 MIL/uL (ref 3.87–5.11)
RDW: 13.8 % (ref 11.5–15.5)
WBC: 13.5 10*3/uL — ABNORMAL HIGH (ref 4.0–10.5)
nRBC: 0 % (ref 0.0–0.2)

## 2020-05-09 LAB — I-STAT CHEM 8, ED
BUN: 14 mg/dL (ref 8–23)
Calcium, Ion: 1.12 mmol/L — ABNORMAL LOW (ref 1.15–1.40)
Chloride: 104 mmol/L (ref 98–111)
Creatinine, Ser: 0.6 mg/dL (ref 0.44–1.00)
Glucose, Bld: 135 mg/dL — ABNORMAL HIGH (ref 70–99)
HCT: 41 % (ref 36.0–46.0)
Hemoglobin: 13.9 g/dL (ref 12.0–15.0)
Potassium: 3.8 mmol/L (ref 3.5–5.1)
Sodium: 138 mmol/L (ref 135–145)
TCO2: 19 mmol/L — ABNORMAL LOW (ref 22–32)

## 2020-05-09 LAB — PROTIME-INR
INR: 1.1 (ref 0.8–1.2)
Prothrombin Time: 13.8 seconds (ref 11.4–15.2)

## 2020-05-09 LAB — ETHANOL: Alcohol, Ethyl (B): <10 mg/dL (ref <10)

## 2020-05-09 LAB — APTT: aPTT: 28 seconds (ref 24–36)

## 2020-05-09 IMAGING — MR MR MRA HEAD W/O CM
7 of 12 series · 21 of 48 positions shown · non-contrast
Comparison: None.

CLINICAL DATA: Expressive aphasia, code stroke follow-up

EXAM:
MRI HEAD WITHOUT CONTRAST
MRA HEAD WITHOUT CONTRAST
TECHNIQUE: Multiplanar, multiecho pulse sequences of the brain and surrounding
structures were obtained without intravenous contrast. Angiographic
images of the head were obtained using MRA technique without
contrast.

[Series 3: DWI · axial · 3.0mm · 0.94mm/px · z∈[-35,+108]mm · 6 of 106 slices shown (1 of 2)]
[im 1/106]
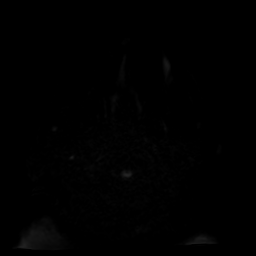
[im 22/106]
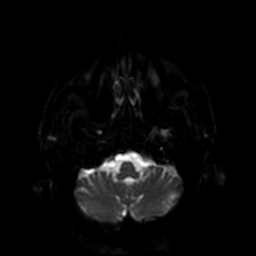
[im 43/106]
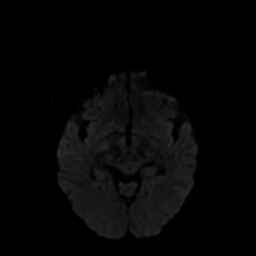
[im 64/106]
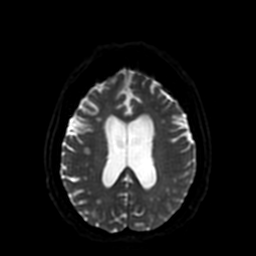
[im 85/106]
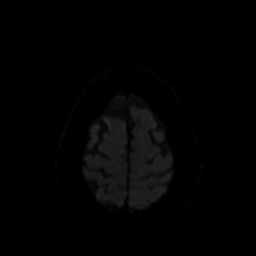
[im 106/106]
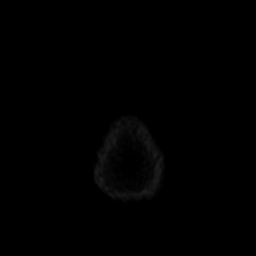

[Series 4: DWI · coronal · 4.0mm · 0.94mm/px · 5 of 78 slices shown (2 of 2)]
[im 1/78]
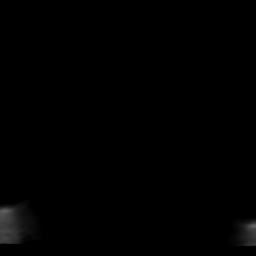
[im 20/78]
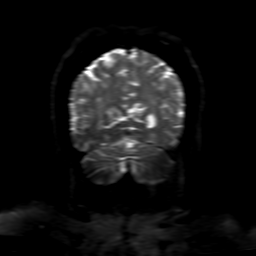
[im 39/78]
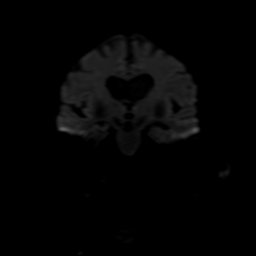
[im 58/78]
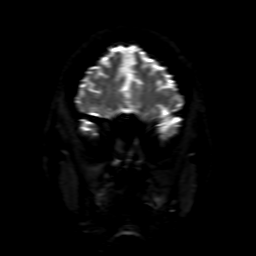
[im 78/78]
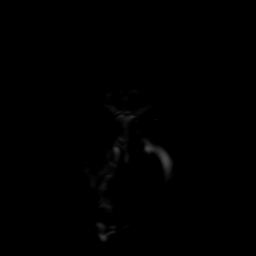

[Series 5: FLAIR · sagittal · 5.0mm · 0.23mm/px · 1 of 23 slices shown (1 of 2)]
[im 1/23]
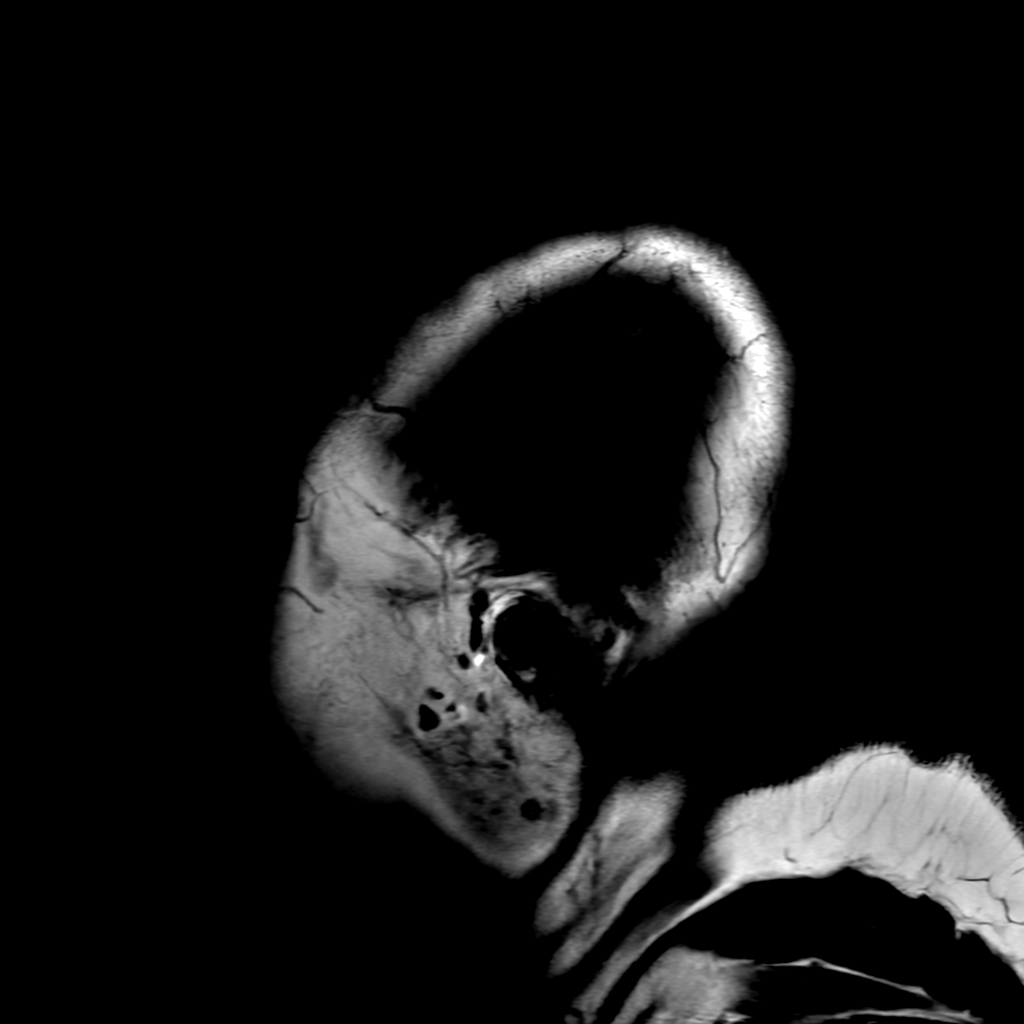

[Series 6: T2 · axial · 5.0mm · 0.23mm/px · z∈[-66,+78]mm · 2 of 26 slices shown]
[im 1/26]
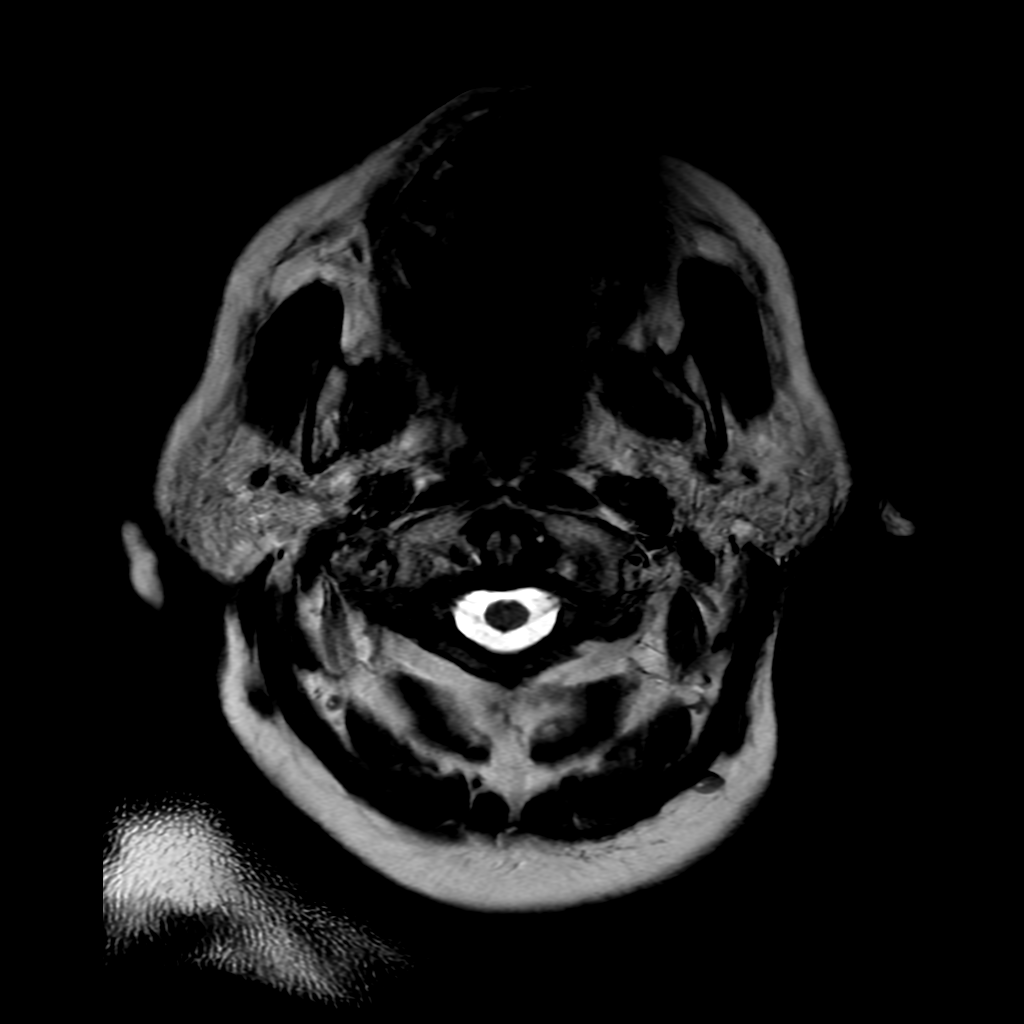
[im 26/26]
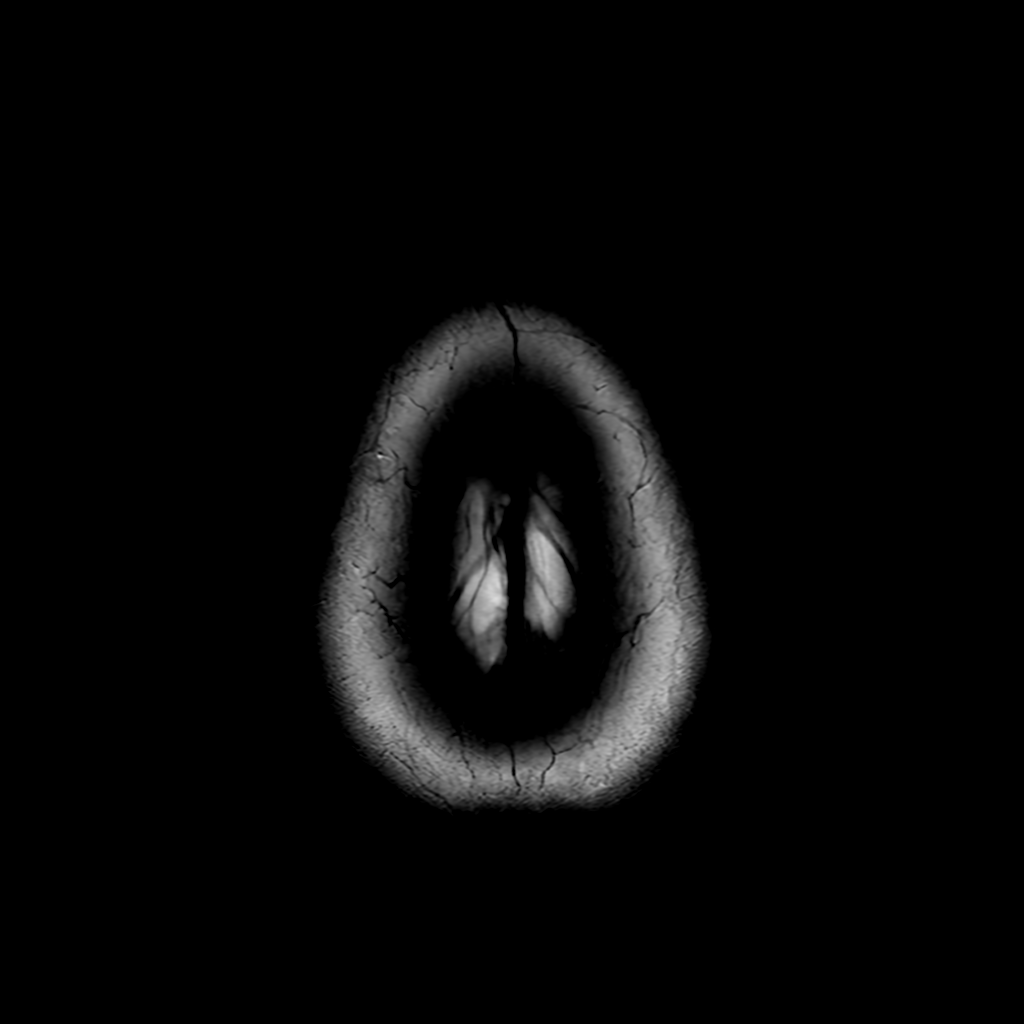

[Series 7: FLAIR · axial · 3.0mm · 0.47mm/px · z∈[-66,+78]mm · 2 of 26 slices shown (2 of 2)]
[im 1/26]
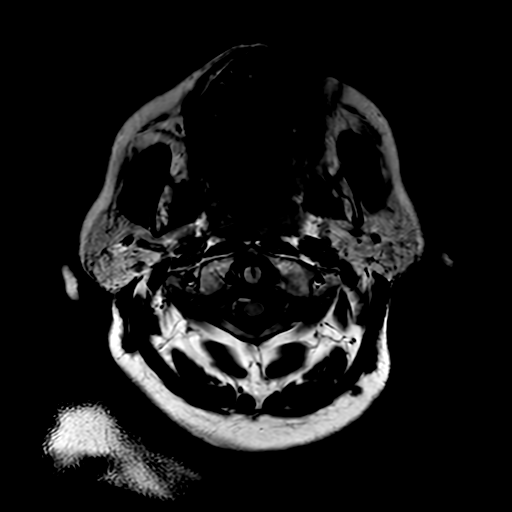
[im 26/26]
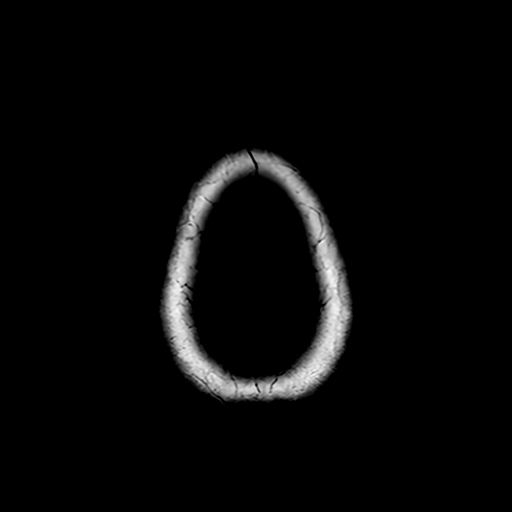

[Series 350: ADC · axial · 3.0mm · 0.94mm/px · z∈[-35,+108]mm · 3 of 53 slices shown (1 of 2)]
[im 1/53]
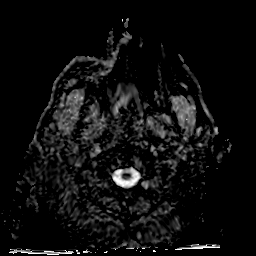
[im 27/53]
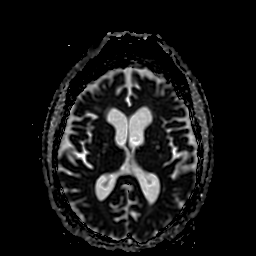
[im 53/53]
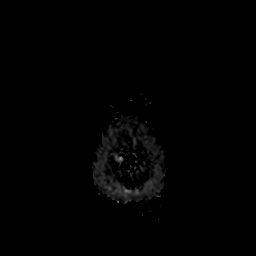

[Series 450: ADC · coronal · 4.0mm · 0.94mm/px · 2 of 39 slices shown (2 of 2)]
[im 1/39]
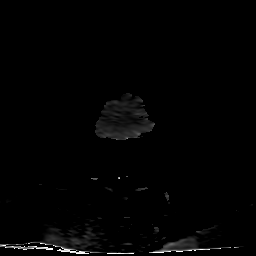
[im 39/39]
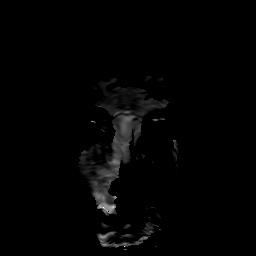

[21 of 48 positions shown; findings below may reference images not displayed]

FINDINGS: MRI HEAD

Brain: There is no acute infarction or intracranial hemorrhage.
There is no intracranial mass, mass effect, or edema. There is no
hydrocephalus or extra-axial fluid collection. Prominence of the
ventricles and sulci reflects mild generalized parenchymal volume
loss. Patchy T2 hyperintensity in the supratentorial white matter is
nonspecific but probably reflects mild chronic microvascular
ischemic changes. Chronic bilateral globus pallidi infarcts, which
could reflect sequelae of prior hypoxic injury.

Vascular: Major vessel flow voids at the skull base are preserved.

Skull and upper cervical spine: Normal marrow signal is preserved.

Sinuses/Orbits: Paranasal sinuses are aerated. Orbits are
unremarkable.

Other: Sella is unremarkable.  Mastoid air cells are clear.

MRA HEAD

Intracranial internal carotid arteries are patent. Middle and
anterior cerebral arteries are patent. Intracranial vertebral
arteries, basilar artery, posterior cerebral arteries are patent.
Bilateral posterior communicating arteries are present with probable
fetal origin of bilateral posterior cerebral arteries. There is no
significant stenosis or aneurysm.
IMPRESSION: No evidence of acute infarction, hemorrhage, or mass. Chronic
findings detailed above.

No proximal intracranial vessel occlusion or significant stenosis.

## 2020-05-09 IMAGING — CT CT HEAD CODE STROKE
3 series · 14 of 47 positions shown, 16 images · non-contrast
Comparison: [DATE]

CLINICAL DATA: Code stroke.

EXAM:
CT HEAD WITHOUT CONTRAST
TECHNIQUE: Contiguous axial images were obtained from the base of the skull
through the vertex without intravenous contrast.

[Series 2: head 5.0 st · axial · 0.47mm/px · z∈[+366,+491]mm · 8 of 31 slices shown, 10 images]
[im 3/31  brain]
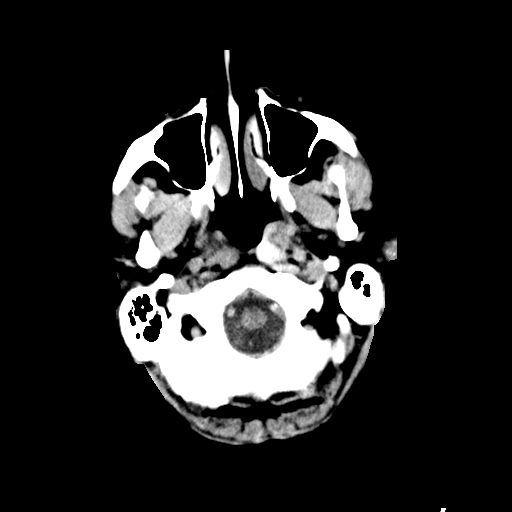
[im 3/31  bone]
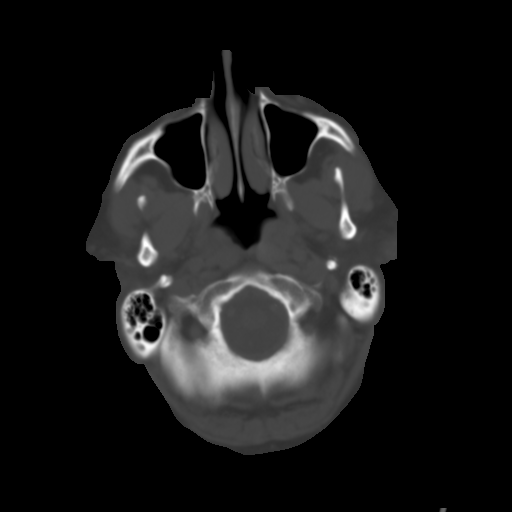
[im 7/31  brain]
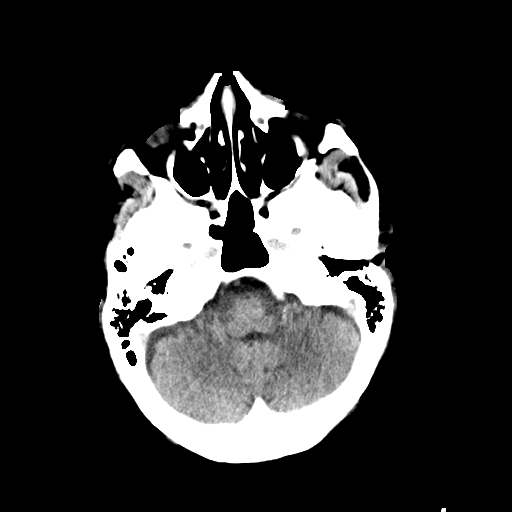
[im 10/31  brain]
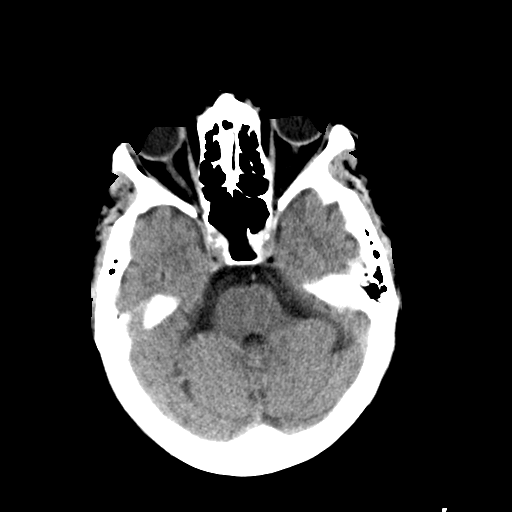
[im 14/31  brain]
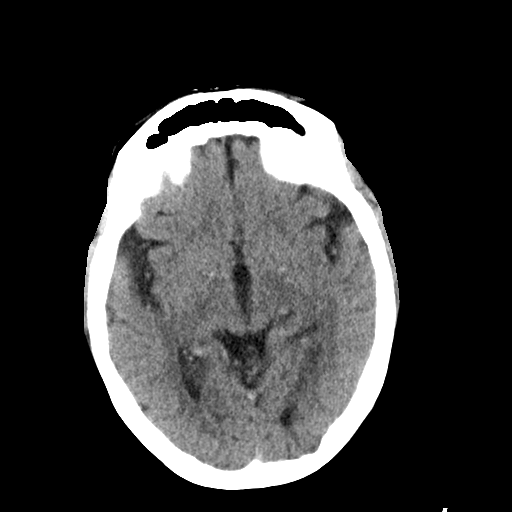
[im 17/31  brain]
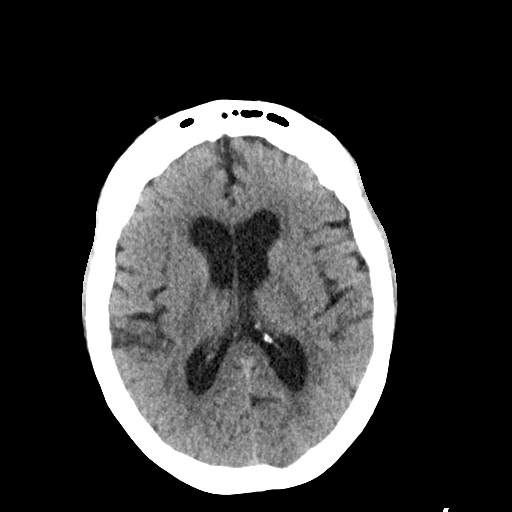
[im 17/31  bone]
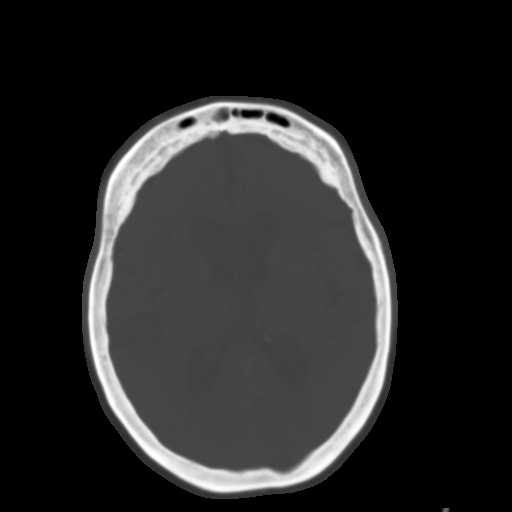
[im 21/31  brain]
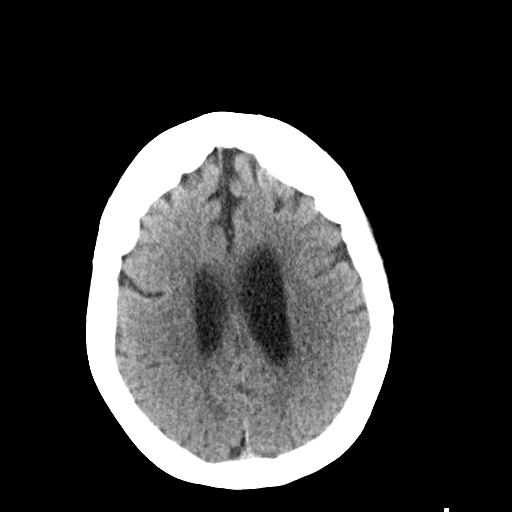
[im 24/31  brain]
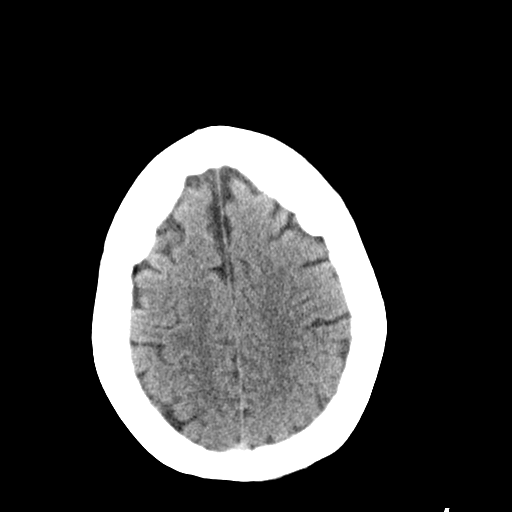
[im 28/31  brain]
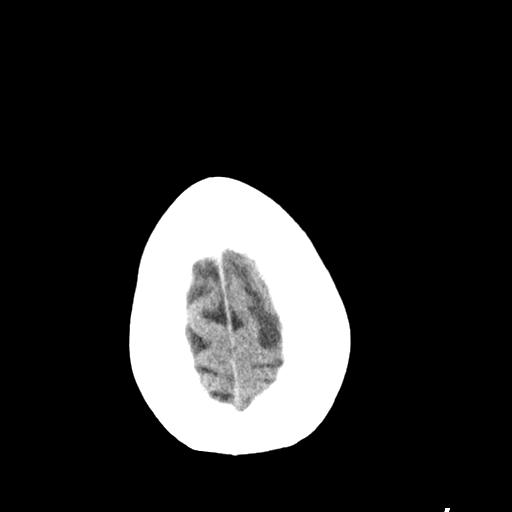

[Series 4: head 3.0 cor st · coronal · 0.31mm/px · 3 of 75 slices shown]
[im 25/75  brain]
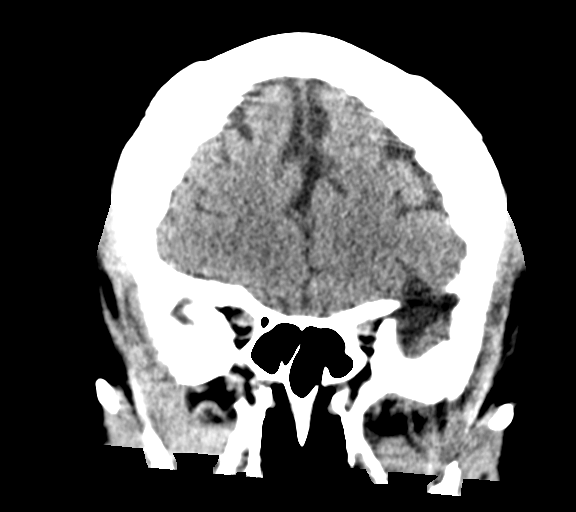
[im 33/75  brain]
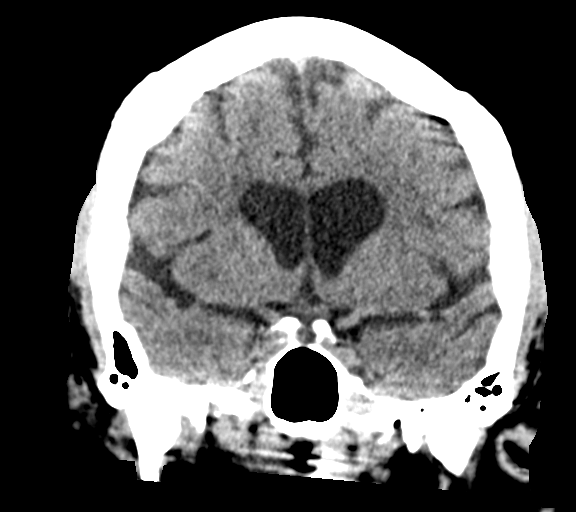
[im 42/75  brain]
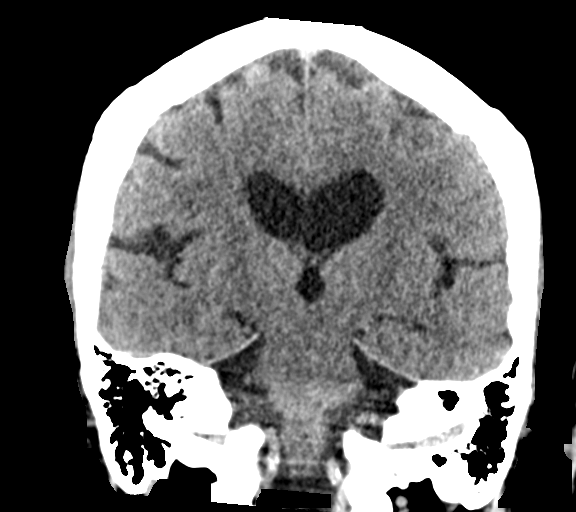

[Series 5: head 3.0 sag st · sagittal · 0.33mm/px · 3 of 65 slices shown]
[im 22/65  brain]
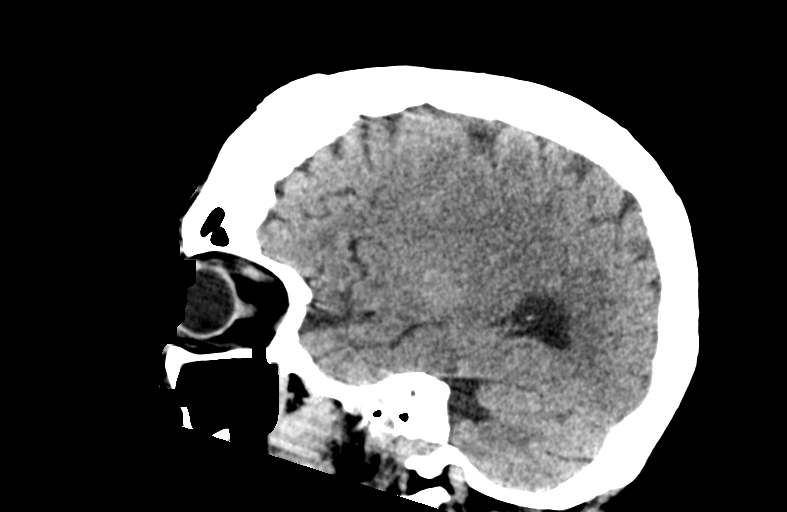
[im 33/65  brain]
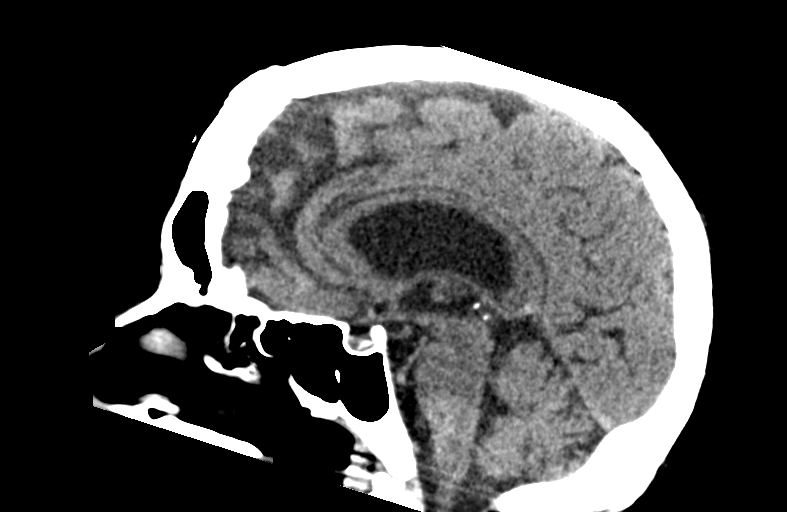
[im 43/65  brain]
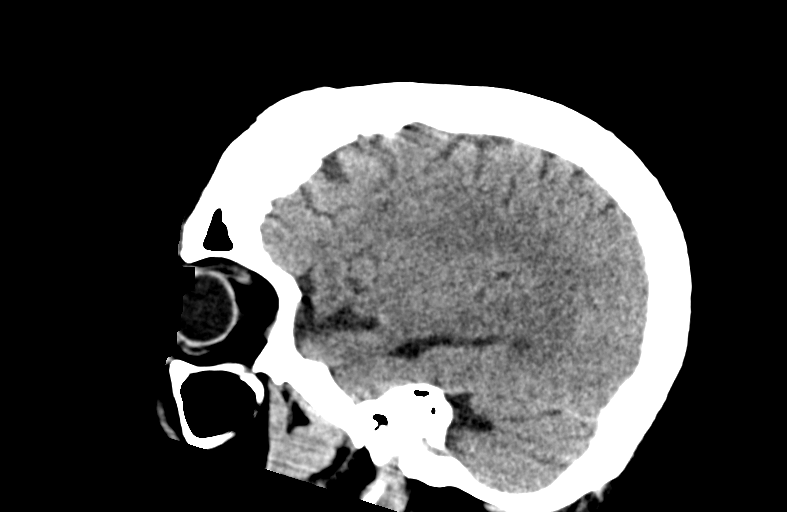

[14 of 47 positions shown; findings below may reference images not displayed]

FINDINGS: Brain: Fall seen subdural hematoma has resolved. There is no acute
intracranial hemorrhage, mass effect, or edema. There is no new loss
of gray-white differentiation. Chronic infarcts involving bilateral
globus pallidi. Ventricles are stable in size. Patchy
hypoattenuation in the supratentorial white matter is nonspecific
but may reflect mild chronic microvascular ischemic changes.

Vascular: No hyperdense vessel.

Skull: Unremarkable.

Sinuses/Orbits: Trace mucosal thickening.  No acute orbital finding.

Other: Mastoid air cells are clear.

ASPECTS ([place] Stroke Program Early CT Score)

- Ganglionic level infarction (caudate, lentiform nuclei, internal
capsule, insula, M1-M3 cortex): 7

- Supraganglionic infarction (M4-M6 cortex): 3

Total score (0-10 with 10 being normal): 10
IMPRESSION: No acute intracranial hemorrhage or evidence of acute infarction.
ASPECT score is 10. Stable chronic findings detailed above.

## 2020-05-09 IMAGING — MR MR HEAD W/O CM
7 of 13 series · 20 of 48 positions shown · non-contrast
Comparison: None.

CLINICAL DATA: Expressive aphasia, code stroke follow-up

EXAM:
MRI HEAD WITHOUT CONTRAST
MRA HEAD WITHOUT CONTRAST
TECHNIQUE: Multiplanar, multiecho pulse sequences of the brain and surrounding
structures were obtained without intravenous contrast. Angiographic
images of the head were obtained using MRA technique without
contrast.

[Series 3: DWI · axial · 3.0mm · 0.94mm/px · z∈[-35,+108]mm · 5 of 106 slices shown (1 of 2)]
[im 1/106]
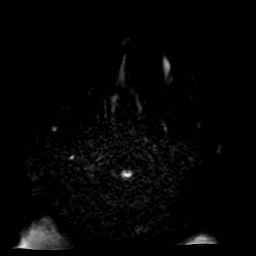
[im 27/106]
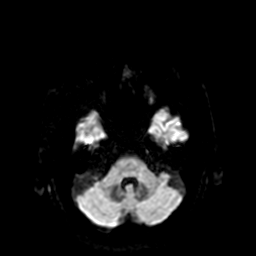
[im 53/106]
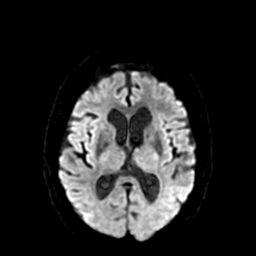
[im 79/106]
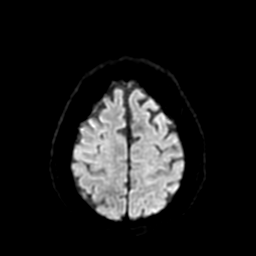
[im 106/106]
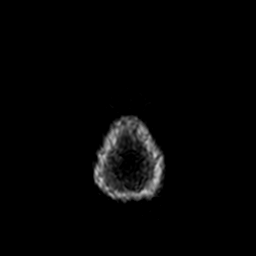

[Series 4: DWI · coronal · 4.0mm · 0.94mm/px · 5 of 78 slices shown (2 of 2)]
[im 1/78]
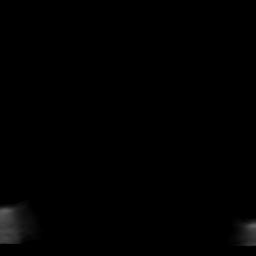
[im 20/78]
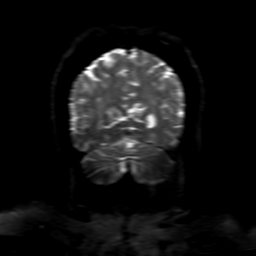
[im 39/78]
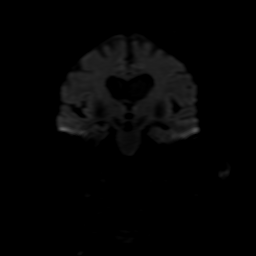
[im 58/78]
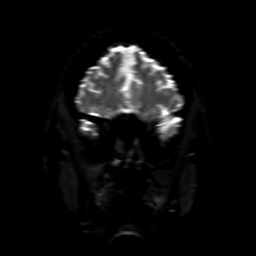
[im 78/78]
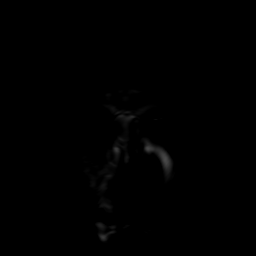

[Series 5: FLAIR · sagittal · 5.0mm · 0.23mm/px · 1 of 23 slices shown (1 of 2)]
[im 1/23]
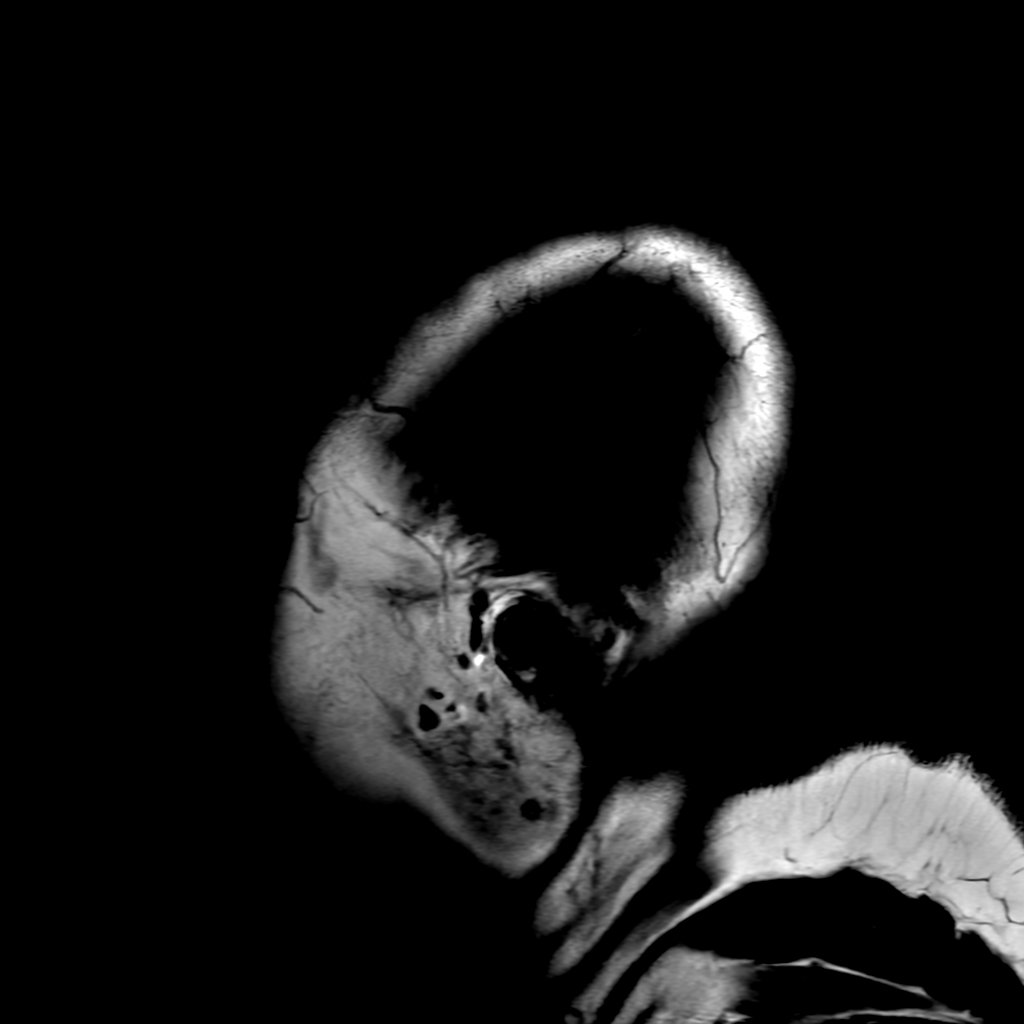

[Series 6: T2 · axial · 5.0mm · 0.23mm/px · z∈[-66,+78]mm · 2 of 26 slices shown]
[im 1/26]
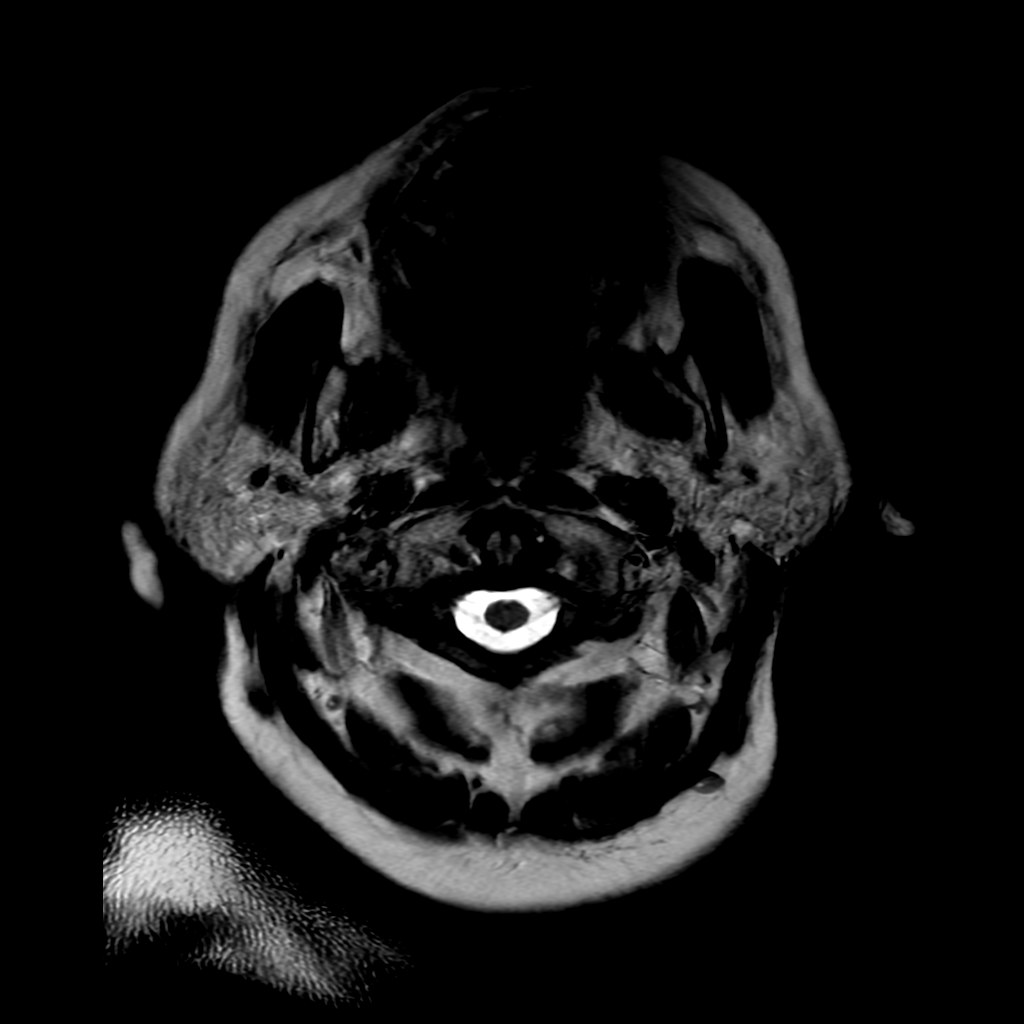
[im 26/26]
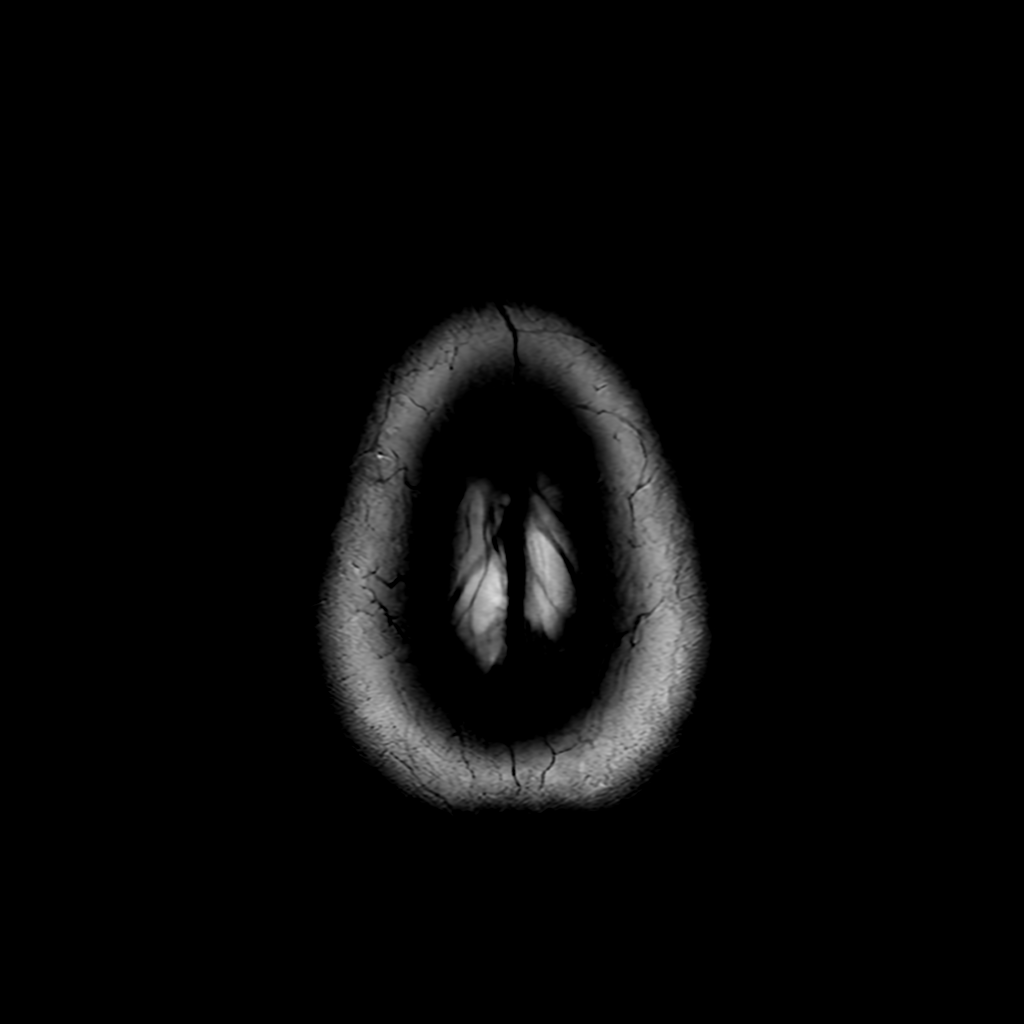

[Series 7: FLAIR · axial · 3.0mm · 0.47mm/px · z∈[-66,+78]mm · 2 of 26 slices shown (2 of 2)]
[im 1/26]
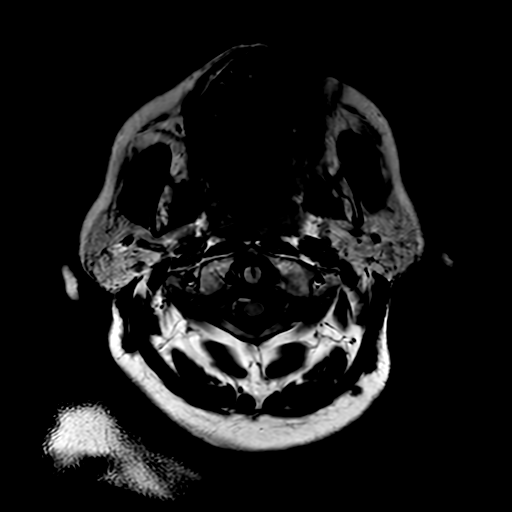
[im 26/26]
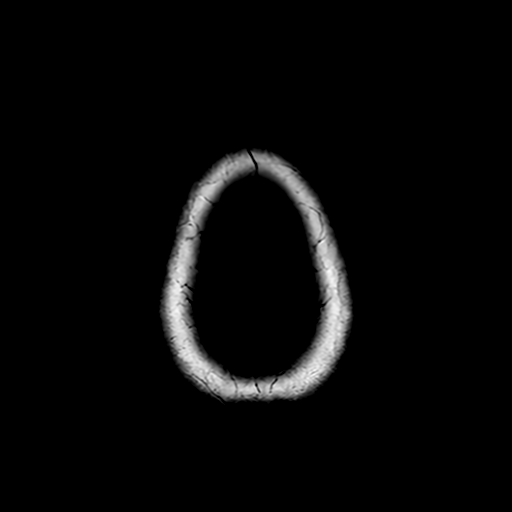

[Series 350: ADC · axial · 3.0mm · 0.94mm/px · z∈[-35,+108]mm · 3 of 53 slices shown (1 of 2)]
[im 1/53]
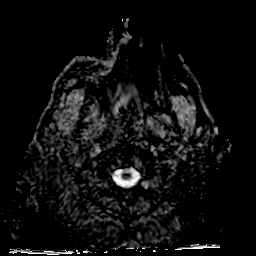
[im 27/53]
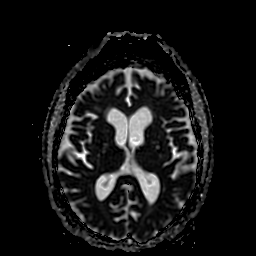
[im 53/53]
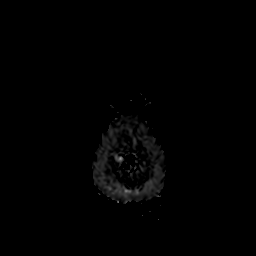

[Series 450: ADC · coronal · 4.0mm · 0.94mm/px · 2 of 39 slices shown (2 of 2)]
[im 1/39]
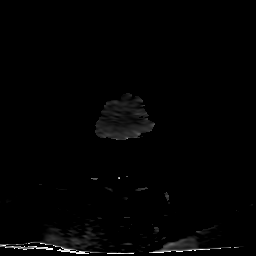
[im 39/39]
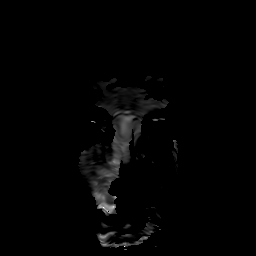

[20 of 48 positions shown; findings below may reference images not displayed]

FINDINGS: MRI HEAD

Brain: There is no acute infarction or intracranial hemorrhage.
There is no intracranial mass, mass effect, or edema. There is no
hydrocephalus or extra-axial fluid collection. Prominence of the
ventricles and sulci reflects mild generalized parenchymal volume
loss. Patchy T2 hyperintensity in the supratentorial white matter is
nonspecific but probably reflects mild chronic microvascular
ischemic changes. Chronic bilateral globus pallidi infarcts, which
could reflect sequelae of prior hypoxic injury.

Vascular: Major vessel flow voids at the skull base are preserved.

Skull and upper cervical spine: Normal marrow signal is preserved.

Sinuses/Orbits: Paranasal sinuses are aerated. Orbits are
unremarkable.

Other: Sella is unremarkable.  Mastoid air cells are clear.

MRA HEAD

Intracranial internal carotid arteries are patent. Middle and
anterior cerebral arteries are patent. Intracranial vertebral
arteries, basilar artery, posterior cerebral arteries are patent.
Bilateral posterior communicating arteries are present with probable
fetal origin of bilateral posterior cerebral arteries. There is no
significant stenosis or aneurysm.
IMPRESSION: No evidence of acute infarction, hemorrhage, or mass. Chronic
findings detailed above.

No proximal intracranial vessel occlusion or significant stenosis.

## 2020-05-09 NOTE — Telephone Encounter (Signed)
Please refill as per office routine med refill policy (all routine meds refilled for 3 mo or monthly per pt preference up to one year from last visit, then month to month grace period for 3 mo, then further med refills will have to be denied)  

## 2020-05-09 NOTE — Consult Note (Signed)
Referring Physician: Dr. Jeanell Sparrow    Chief Complaint: Trouble speaking  HPI: Alexandra Wolfe is an 74 y.o. female with a history of left parafalcine subdural hematoma, SVT, hyperlipidemia, depression, anxiety and DM, who today presents acutely from home via EMS after physical therapist noted patient to have trouble speaking. LKN was 11:40 AM.   She last presented on 03/13/20 with symptoms of AMS and abnormal speech. She was seen by Dr. Lorraine Lax at that time. Per Dr. Arrie Eastern note: "EMS was called secondary to patient's healthcare service representative and son expressing the fact that the patient was having more slurred speech and stuttering.  She was also seen to be breathing harder than usual.  Patient was recently seen for a parafalcine subdural hematoma and was placed on Keppra with repeat CT today showing resolution of the subdural hematoma.  On consultation patient is able to follow some commands however is breathing very hard and babbling.  All history is obtained from son.  There is question if patient might have fallen again however C-spine showed no abnormalities in c-collar was removed."  CT head at that time revealed no acute intracranial abnormality. There was decreased size of the left parafalcine subdural hematoma.   Her subdural hematoma occurred on 02/29/2020, when she had a fall. The patient was placed on Keppra; aspirin was held for a week.  STAT CT head today shows no acute abnormality. Previously seen subdural hematoma has resolved. 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 Patchy hypoattenuation in the supratentorial white matter is nonspecific but may reflect mild chronic microvascular ischemic changes.  LSN: 11:40 AM tPA Given: No: Recent intracranial hemorrhage. Findings on exam most consistent with a functional etiology for her deficits. Risks of tPA significantly outweigh potential benefits.   Past Medical History:  Diagnosis Date  . ALLERGIC RHINITIS 04/02/2009    no per pt  . Anxiety   . BACK PAIN 09/27/2008  . BUNIONS, BILATERAL 12/23/2007  . CHEST DISCOMFORT, ATYPICAL 11/07/2009  . Chronic LBP   . COLONIC POLYPS, HX OF 09/13/2007  . CONSTIPATION 09/27/2008  . DEPRESSION 09/13/2007  . Diabetes mellitus    diet controlled  . DYSPNEA ON EXERTION 02/06/2010  . Eustachian tube dysfunction 05/20/2011  . FOOT PAIN, RIGHT 09/27/2008  . HYPERLIPIDEMIA 03/11/2008  . LBP (low back pain) 05/20/2011  . Leukocytosis 11/20/2011  . OSTEOARTHROSIS NOS, LOWER LEG 09/13/2007  . SHOULDER PAIN, LEFT 02/14/2009  . SVT (supraventricular tachycardia) ([place])   . SYMPTOM, PALPITATIONS 09/13/2007  . URINARY INCONTINENCE 08/23/2009  . Vertigo 05/20/2011    Past Surgical History:  Procedure Laterality Date  . BUNIONECTOMY     right  . ccx    . CHOLECYSTECTOMY    . LUMBAR LAMINECTOMY    . LUMBAR LAMINECTOMY    . SHOULDER ARTHROSCOPY  12/13/2011   Procedure: ARTHROSCOPY SHOULDER;  Surgeon: Augustin Schooling;  Location: [facility];  Service: Orthopedics;  Laterality: Left;  Left Shoulder ArthroscopyDebridement Limited Tenodesis Open Rotator Cuff Repair Spur Removal Right Shoulder Injection     Family History  Problem Relation Age of Onset  . Colon cancer Mother   . Heart disease Father   . Diabetes type II Father   . Heart disease Brother   . Diabetes Other        father  . Esophageal cancer Neg Hx   . Stomach cancer Neg Hx   . Rectal cancer Neg Hx    Social History:  reports that she has quit smoking. She has  a 10.00 pack-year smoking history. She has never used smokeless tobacco. She reports that she does not drink alcohol or use drugs.  Allergies:  Allergies  Allergen Reactions  . Aripiprazole     REACTION: agitation  . Simvastatin     REACTION: myalgia    Home Medications:  No current facility-administered medications on file prior to encounter.   Current Outpatient Medications on File Prior to Encounter  Medication Sig Dispense Refill  . acetaminophen  (TYLENOL) 325 MG tablet Take 2 tablets (650 mg total) by mouth every 6 (six) hours as needed for mild pain or headache.    . albuterol (VENTOLIN HFA) 108 (90 Base) MCG/ACT inhaler Inhale 2 puffs into the lungs every 6 (six) hours as needed for wheezing or shortness of breath. 18 g 0  . atorvastatin (LIPITOR) 80 MG tablet Take 1 tablet (80 mg total) by mouth daily. 30 tablet 0  . budesonide-formoterol (SYMBICORT) 160-4.5 MCG/ACT inhaler Inhale 2 puffs into the lungs 2 (two) times daily. 6 g 0  . Calcium Carbonate (CALCIUM 500 PO) Take 1,000 mg by mouth 2 (two) times daily.     . cetirizine (ZYRTEC) 10 MG tablet Take 1 tablet (10 mg total) by mouth daily. 30 tablet 11  . diclofenac (VOLTAREN) 75 MG EC tablet Take 75 mg by mouth 2 (two) times daily.    . fluticasone (FLONASE) 50 MCG/ACT nasal spray Place 2 sprays into both nostrils daily. 16 g 0  . levETIRAcetam (KEPPRA) 500 MG tablet Take 1 tablet (500 mg total) by mouth 2 (two) times daily. 180 tablet 1  . meclizine (ANTIVERT) 25 MG tablet Take 1 tablet (25 mg total) by mouth 3 (three) times daily as needed for dizziness. 90 tablet 0  . metFORMIN (GLUCOPHAGE) 500 MG tablet Take 500 mg by mouth 2 (two) times daily.    . metFORMIN (GLUMETZA) 500 MG (MOD) 24 hr tablet Take 1 tablet (500 mg total) by mouth 2 (two) times daily with a meal. 60 tablet 0  . metoprolol succinate (TOPROL-XL) 25 MG 24 hr tablet TAKE 1 TABLET(25 MG) BY MOUTH DAILY 30 tablet 0  . Multiple Vitamin (MULTIVITAMIN WITH MINERALS) TABS tablet Take 1 tablet by mouth daily.    [name] MYRBETRIQ 50 MG TB24 tablet Take 1 tablet (50 mg total) by mouth at bedtime. 30 tablet 0  . NON FORMULARY Heart healthy lcs diet    . NP THYROID 30 MG tablet Take 1 tablet (30 mg total) by mouth daily. 30 tablet 0  . ondansetron (ZOFRAN) 4 MG tablet Take 1 tablet (4 mg total) by mouth every 8 (eight) hours as needed for nausea or vomiting. 30 tablet 0  . senna-docusate (SENOKOT-S) 8.6-50 MG tablet Take 1 tablet  by mouth at bedtime as needed for mild constipation.    . traZODone (DESYREL) 50 MG tablet TAKE 1 TABLET(50 MG) BY MOUTH AT BEDTIME 30 tablet 0    ROS: Unable to obtain due to speech deficit.   Physical Examination: There were no vitals taken for this visit.  HEENT: [place]/AT Lungs: Panting respirations wax and wane in intensity during exam and are accompanied by anxious affect Ext: No edema  Neurologic Examination: Mental Status: Awake and alert. Has a functional speech pattern consisting of hypophonia, hesitant vocal utterances accompanied by effortful affect and exaggerated perioral movements including pursing of lips, flattening of lips and grimacing. Makes hushed hooting noises prior to some of the vocal utterances. In this context she was able to communicate to  examiner the day of the week, month, city and state. For year, she rapidly showed 2 fingers, then a 0 sign, then pantomimed the number 22. She is able to follow a 3 step command. Repetition is impaired but the verbal output for this task has an embellished quality. Able to name with mouthed words a thumb, pinky and index finger. In the context of the embellished quality of her verbal output, speech is nonfluent. However, during examination of her BLE, she abruptly became completely fluent in her speech, explaining to team that she could not have reflexes elicited due to bilateral knee pain from prior operations. She then reverted back to her presenting speech pattern. Cranial Nerves: II:  Visual fields intact bilaterally. PERRL. III,IV, VI: No ptosis. EOMI.  V,VII: Flattens lips when asked to smile. Purses lips and makes whooshing noises when asked to puff out cheeks. No asymmetry noted. Facial temp sensation is equal bilaterally. VIII: Hearing intact to voice IX,X: Palate rises symmetrically XI: Head is midline XII: Does not protrude tongue to command - presses tongue to back of teeth despite several requests to extend tongue. In  this context tongue is midline  Motor: Moves LUE less readily than right when testing strength.  RUE 5/5 proxmally an ddistally.  LUE will move with coaching and max grip, biceps, triceps and deltoid strength elicited in the context of giveway weakness is 4/5. With additional coaching, she begins to shake her distal LUE, which waxes and wanes - this becomes more prominent with additional coaching and is accompanied by increased hyperventilation and anxious affect. The tremulous movements then propagate to her RUE.  Will not lift either LE antigravity on her own due to stated knee pain. With examiner assisting patient, there is max elicitable strength of 4-/5 at hips and 4/5 at knees bilaterally. ADF 5/5 bilaterally.  Sensory: Temp sensation decreased to LUE and BLE. FT intact x 4 with no extinction to DSS.  Deep Tendon Reflexes:  2+ bilateral brachioradialis and biceps. Refuses elicitation of patellar reflexes.  0 achilles bilaterally.  Toes equivocal bilaterally  Cerebellar: Dramatic anxious and effortful affect during attempts to test FNF. There is non-physiological tremor bilaterally during testing, which is not present at other times during the exam. The tremor worsens with coaching, When her left finger reaches about 3 inches from her nose, it stops and then starts exhibiting maximal tremulous movements, moving side to side but never touching her nose - these motor manifestations appear most consistent with embellishment, are accompanied by an increase in effortful affect and hyperventilation, and also worsen with coaching/attention from examiner.  Gait: Deferred  Results for orders placed or performed during the hospital encounter of 05/09/20 (from the past 48 hour(s))  CBG monitoring, ED     Status: Abnormal   Collection Time: 05/09/20 12:26 PM  Result Value Ref Range   Glucose-Capillary 128 (H) 70 - 99 mg/dL    Comment: Glucose reference range applies only to samples taken after fasting  for at least 8 hours.   Comment 1 Notify RN    Comment 2 Document in Chart   Protime-INR     Status: None   Collection Time: 05/09/20 12:31 PM  Result Value Ref Range   Prothrombin Time 13.8 11.4 - 15.2 seconds   INR 1.1 0.8 - 1.2    Comment: (NOTE) INR goal varies based on device and disease states. Performed at [facility], [place] [address] [place], [place] [address]   APTT  Status: None   Collection Time: 05/09/20 12:31 PM  Result Value Ref Range   aPTT 28 24 - 36 seconds    Comment: Performed at [facility], [place] [address], [place], [place] [address]  CBC     Status: Abnormal   Collection Time: 05/09/20 12:31 PM  Result Value Ref Range   WBC 13.5 (H) 4.0 - 10.5 K/uL   RBC 4.85 3.87 - 5.11 MIL/uL   Hemoglobin 14.6 12.0 - 15.0 g/dL   HCT 43.4 36.0 - 46.0 %   MCV 89.5 80.0 - 100.0 fL   MCH 30.1 26.0 - 34.0 pg   MCHC 33.6 30.0 - 36.0 g/dL   RDW 13.8 11.5 - 15.5 %   Platelets 352 150 - 400 K/uL   nRBC 0.0 0.0 - 0.2 %    Comment: Performed at [facility], [place] [address] [place], [place] [address]  Differential     Status: Abnormal   Collection Time: 05/09/20 12:31 PM  Result Value Ref Range   Neutrophils Relative % 56 %   Neutro Abs 7.5 1.7 - 7.7 K/uL   Lymphocytes Relative 35 %   Lymphs Abs 4.7 (H) 0.7 - 4.0 K/uL   Monocytes Relative 6 %   Monocytes Absolute 0.8 0.1 - 1.0 K/uL   Eosinophils Relative 2 %   Eosinophils Absolute 0.3 0.0 - 0.5 K/uL   Basophils Relative 1 %   Basophils Absolute 0.1 0.0 - 0.1 K/uL   Immature Granulocytes 0 %   Abs Immature Granulocytes 0.04 0.00 - 0.07 K/uL    Comment: Performed at [facility] [address], [place], [place] [address]  I-stat chem 8, ED     Status: Abnormal   Collection Time: 05/09/20 12:35 PM  Result Value Ref Range   Sodium 138 135 - 145 mmol/L   Potassium 3.8 3.5 - 5.1 mmol/L   Chloride 104 98 - 111 mmol/L   BUN 14 8 - 23 mg/dL   Creatinine, Ser 0.60 0.44 - 1.00 mg/dL   Glucose, Bld  135 (H) 70 - 99 mg/dL    Comment: Glucose reference range applies only to samples taken after fasting for at least 8 hours.   Calcium, Ion 1.12 (L) 1.15 - 1.40 mmol/L   TCO2 19 (L) 22 - 32 mmol/L   Hemoglobin 13.9 12.0 - 15.0 g/dL   HCT 41.0 36.0 - 46.0 %   CT HEAD CODE STROKE WO CONTRAST  Result Date: 05/09/2020 CLINICAL DATA:  Code stroke. EXAM: CT HEAD WITHOUT CONTRAST TECHNIQUE: Contiguous axial images were obtained from the base of the skull through the vertex without intravenous contrast. COMPARISON:  03/13/2020 FINDINGS: Brain: Fall seen subdural hematoma has resolved. There is no acute intracranial hemorrhage, mass effect, or edema. There is no new loss of gray-white differentiation. Chronic infarcts involving bilateral globus pallidi. Ventricles are stable in size. Patchy hypoattenuation in the supratentorial white matter is nonspecific but may reflect mild chronic microvascular ischemic changes. Vascular: No hyperdense vessel. Skull: Unremarkable. Sinuses/Orbits: Trace mucosal thickening.  No acute orbital finding. Other: Mastoid air cells are clear. ASPECTS ([place] Stroke Program Early CT Score) - Ganglionic level infarction (caudate, lentiform nuclei, internal capsule, insula, M1-M3 cortex): 7 - Supraganglionic infarction (M4-M6 cortex): 3 Total score (0-10 with 10 being normal): 10 IMPRESSION: No acute intracranial hemorrhage or evidence of acute infarction. ASPECT score is 10. Stable chronic findings detailed above. Electronically Signed   By: Macy Mis M.D.   On: 05/09/2020  12:47    Assessment: 74 y.o. female presenting with acute onset of dysphasia 1. Exam reveals multiple inconsistencies as well as components that are most consistent with embellishment.  2. CT head: No acute abnormality. Previously seen subdural hematoma has resolved. 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 Patchy hypoattenuation in the supratentorial white matter is nonspecific but may  reflect mild chronic microvascular ischemic changes. 3. History of left parafalcine subdural hematoma. 4. Stroke Risk Factors - HLD and DM  Recommendations: 1. STAT MRI and MRA of the brain without contrast 2. Continue ASA and atorvastatin   3. Telemetry monitoring 4. Frequent neuro checks 5. Continue Keppra 500 mg BID.  6. If MRI brain is negative for stroke while she is still symptomatic, obtain psychology consult to assess for possible psychosocial stressors   @Electronically  signed: Dr. Kerney Elbe  05/09/2020, 12:54 PM

## 2020-05-09 NOTE — Code Documentation (Signed)
Patient from home (lives alone) working with home health physical therapy when they noticed at 1140 pt became aphasic. EMS arrived and called code stroke. Pt arrived to [facility], was met by stroke team and ED RN. Pt with expressive aphasia, left sided weakness and left sensory deficits on exam. NIHSS 7. See documentation. CT completed and pt taken for STAT MRI/MRA brain per MD order. EMS stated pt has a hx of a head bleed 2 months prior. She was also seen last month for a TIA. Pt is not on any blood thinners. MRI/MRA negative for stroke per MD. No TPA r/t no stroke found. Care Plan: Cancel Code Stroke. Hand off with [facility]. Homero Hyson, Rande Brunt, RN, SCRN

## 2020-05-09 NOTE — ED Triage Notes (Signed)
Patient arrives to ED with [facility] as a Code Stroke with complaints of expressive asphasia. Per EMS patient was at home exercising with her physical therapist when she suddenly went aphasic which was witnessed at 1140am. Patient has hx of TIA and brain bleed. Patient met at ED bridge by this RN, stroke team, and neurologist. Initial NIH 7.

## 2020-05-09 NOTE — Telephone Encounter (Signed)
    Clair Gulling calling for verbal order  2x week for 2 weeks 1x week for 7 weeks  Confirm diagnosis  Phone [phone]

## 2020-05-09 NOTE — Discharge Instructions (Addendum)
Contact a health care provider if you: Are struggling to manage at home. Are feeling depressed, anxious, or frustrated. Do not have anyone to help you at home. Do not feel safe at home. Are unable to care for yourself. Get help right away if you have: A sudden, severe headache. New numbness or paralysis. Any new or worsening symptoms, such as problems with speech, balance, or thinking ability.

## 2020-05-09 NOTE — ED Provider Notes (Signed)
[facility] EMERGENCY DEPARTMENT Provider Note   CSN: [id number] Arrival date & time: 05/09/20  1228     History No chief complaint on file.   Alexandra Wolfe is a 74 y.o. female with a past medical history of anxiety, chronic pain, vertigo, previous TIA who presents as a code stroke.  The patient was apparently working with physical therapy today when she has had sudden onset of aphasia.  She was sent emergently to the CT scanner.  On arrival the patient was assessed has an NIH of 7, mild left-sided weakness, and inability to express words.  HPI     Past Medical History:  Diagnosis Date  . ALLERGIC RHINITIS 04/02/2009   no per pt  . Anxiety   . BACK PAIN 09/27/2008  . BUNIONS, BILATERAL 12/23/2007  . CHEST DISCOMFORT, ATYPICAL 11/07/2009  . Chronic LBP   . COLONIC POLYPS, HX OF 09/13/2007  . CONSTIPATION 09/27/2008  . DEPRESSION 09/13/2007  . Diabetes mellitus    diet controlled  . DYSPNEA ON EXERTION 02/06/2010  . Eustachian tube dysfunction 05/20/2011  . FOOT PAIN, RIGHT 09/27/2008  . HYPERLIPIDEMIA 03/11/2008  . LBP (low back pain) 05/20/2011  . Leukocytosis 11/20/2011  . OSTEOARTHROSIS NOS, LOWER LEG 09/13/2007  . SHOULDER PAIN, LEFT 02/14/2009  . SVT (supraventricular tachycardia) ([place])   . SYMPTOM, PALPITATIONS 09/13/2007  . URINARY INCONTINENCE 08/23/2009  . Vertigo 05/20/2011    Patient Active Problem List   Diagnosis Date Noted  . Neurocognitive deficits 03/30/2020  . Acute encephalopathy 03/13/2020  . Acute subdural hematoma ([place]) 02/29/2020  . Syncope 02/29/2020  . Mild renal insufficiency 02/29/2020  . Essential hypertension 02/29/2020  . Asthma 02/29/2020  . Chronic dyspnea 01/04/2020  . Allergic rhinitis 01/04/2020  . External otitis of right ear 07/25/2019  . Bilateral foot pain 07/01/2019  . Diabetic neuropathy ([place]) 03/04/2018  . Obstructive sleep apnea 04/04/2016  . Peripheral edema 12/23/2015  . Hypersomnolence 12/22/2015  . Baker's  cyst of knee 11/24/2013  . Left knee pain 11/24/2013  . Dyspnea 11/24/2013  . Rash 05/26/2012  . Bilateral shoulder pain 12/13/2011  . Diabetes mellitus type II, non insulin dependent ([place]) 11/20/2011  . Leukocytosis 11/20/2011  . Preoperative clearance 11/20/2011  . Eustachian tube dysfunction 05/20/2011  . LBP (low back pain) 05/20/2011  . Encounter for well adult exam with abnormal findings 05/20/2011  . Vertigo 05/20/2011  . Hypothyroidism 02/15/2011  . Other dysphagia 02/15/2011  . URINARY INCONTINENCE 08/23/2009  . Seasonal and perennial allergic rhinitis 04/02/2009  . CONSTIPATION 09/27/2008  . Hyperlipidemia 03/11/2008  . BUNIONS, BILATERAL 12/23/2007  . Depression 09/13/2007  . OSTEOARTHROSIS NOS, LOWER LEG 09/13/2007  . COLONIC POLYPS, HX OF 09/13/2007    Past Surgical History:  Procedure Laterality Date  . BUNIONECTOMY     right  . ccx    . CHOLECYSTECTOMY    . LUMBAR LAMINECTOMY    . LUMBAR LAMINECTOMY    . SHOULDER ARTHROSCOPY  12/13/2011   Procedure: ARTHROSCOPY SHOULDER;  Surgeon: Augustin Schooling;  Location: [facility];  Service: Orthopedics;  Laterality: Left;  Left Shoulder ArthroscopyDebridement Limited Tenodesis Open Rotator Cuff Repair Spur Removal Right Shoulder Injection      OB History    Gravida  2   Para  2   Term      Preterm      AB      Living  2     SAB      TAB  Ectopic      Multiple      Live Births              Family History  Problem Relation Age of Onset  . Colon cancer Mother   . Heart disease Father   . Diabetes type II Father   . Heart disease Brother   . Diabetes Other        father  . Esophageal cancer Neg Hx   . Stomach cancer Neg Hx   . Rectal cancer Neg Hx     Social History   Tobacco Use  . Smoking status: Former Smoker    Packs/day: 1.00    Years: 10.00    Pack years: 10.00  . Smokeless tobacco: Never Used  . Tobacco comment: Smoked as a teenager. Quit in 1970  Substance Use Topics  .  Alcohol use: No    Alcohol/week: 0.0 standard drinks  . Drug use: No    Home Medications Prior to Admission medications   Medication Sig Start Date End Date Taking? Authorizing Provider  acetaminophen (TYLENOL) 325 MG tablet Take 2 tablets (650 mg total) by mouth every 6 (six) hours as needed for mild pain or headache. 03/02/20   Geradine Girt, DO  albuterol (VENTOLIN HFA) 108 (90 Base) MCG/ACT inhaler Inhale 2 puffs into the lungs every 6 (six) hours as needed for wheezing or shortness of breath. 03/30/20   Medina-Vargas, Monina C, NP  atorvastatin (LIPITOR) 80 MG tablet Take 1 tablet (80 mg total) by mouth daily. 03/30/20   Medina-Vargas, Monina C, NP  budesonide-formoterol (SYMBICORT) 160-4.5 MCG/ACT inhaler Inhale 2 puffs into the lungs 2 (two) times daily. 03/30/20   Medina-Vargas, Monina C, NP  Calcium Carbonate (CALCIUM 500 PO) Take 1,000 mg by mouth 2 (two) times daily.     [provider]  cetirizine (ZYRTEC) 10 MG tablet Take 1 tablet (10 mg total) by mouth daily. 04/05/20 04/05/21  Biagio Borg, MD  diclofenac (VOLTAREN) 75 MG EC tablet Take 75 mg by mouth 2 (two) times daily. 03/29/20   [provider]  fluticasone (FLONASE) 50 MCG/ACT nasal spray Place 2 sprays into both nostrils daily. 03/30/20   Medina-Vargas, Monina C, NP  levETIRAcetam (KEPPRA) 500 MG tablet Take 1 tablet (500 mg total) by mouth 2 (two) times daily. 04/06/20   Biagio Borg, MD  meclizine (ANTIVERT) 25 MG tablet Take 1 tablet (25 mg total) by mouth 3 (three) times daily as needed for dizziness. 03/30/20   Medina-Vargas, Monina C, NP  metFORMIN (GLUCOPHAGE) 500 MG tablet Take 500 mg by mouth 2 (two) times daily. 03/30/20   [provider]  metFORMIN (GLUMETZA) 500 MG (MOD) 24 hr tablet Take 1 tablet (500 mg total) by mouth 2 (two) times daily with a meal. 03/30/20   Medina-Vargas, Monina C, NP  metoprolol succinate (TOPROL-XL) 25 MG 24 hr tablet TAKE 1 TABLET(25 MG) BY MOUTH DAILY 03/30/20    Medina-Vargas, Monina C, NP  Multiple Vitamin (MULTIVITAMIN WITH MINERALS) TABS tablet Take 1 tablet by mouth daily. 03/02/20   Geradine Girt, DO  MYRBETRIQ 50 MG TB24 tablet Take 1 tablet (50 mg total) by mouth at bedtime. 03/30/20   Medina-Vargas, Monina C, NP  NON FORMULARY Heart healthy lcs diet    [provider]  NP THYROID 30 MG tablet Take 1 tablet (30 mg total) by mouth daily. 03/30/20   Medina-Vargas, Monina C, NP  ondansetron (ZOFRAN) 4 MG tablet Take 1 tablet (4  mg total) by mouth every 8 (eight) hours as needed for nausea or vomiting. 04/27/20   Biagio Borg, MD  senna-docusate (SENOKOT-S) 8.6-50 MG tablet Take 1 tablet by mouth at bedtime as needed for mild constipation. 03/16/20   Hosie Poisson, MD  traZODone (DESYREL) 50 MG tablet TAKE 1 TABLET(50 MG) BY MOUTH AT BEDTIME 03/30/20   Medina-Vargas, Monina C, NP    Allergies    Aripiprazole and Simvastatin  Review of Systems   Review of Systems Ten systems reviewed and are negative for acute change, except as noted in the HPI.   Physical Exam Updated Vital Signs There were no vitals taken for this visit.  Physical Exam Vitals and nursing note reviewed.  Constitutional:      General: She is not in acute distress.    Appearance: She is well-developed. She is not diaphoretic.  HENT:     Head: Normocephalic and atraumatic.  Eyes:     General: No scleral icterus.    Conjunctiva/sclera: Conjunctivae normal.  Cardiovascular:     Rate and Rhythm: Normal rate and regular rhythm.     Heart sounds: Normal heart sounds. No murmur. No friction rub. No gallop.   Pulmonary:     Effort: No respiratory distress.     Breath sounds: Normal breath sounds.     Comments: Abnormal breathing pattern Abdominal:     General: Bowel sounds are normal. There is no distension.     Palpations: Abdomen is soft. There is no mass.     Tenderness: There is no abdominal tenderness. There is no guarding.  Musculoskeletal:     Cervical back:  Normal range of motion.  Skin:    General: Skin is warm and dry.  Neurological:     Mental Status: She is alert and oriented to person, place, and time.     Comments: Stuttering speech  Psychiatric:        Behavior: Behavior normal.     ED Results / Procedures / Treatments   Labs (all labs ordered are listed, but only abnormal results are displayed) Labs Reviewed  CBC - Abnormal; Notable for the following components:      Result Value   WBC 13.5 (*)    All other components within normal limits  DIFFERENTIAL - Abnormal; Notable for the following components:   Lymphs Abs 4.7 (*)    All other components within normal limits  CBG MONITORING, ED - Abnormal; Notable for the following components:   Glucose-Capillary 128 (*)    All other components within normal limits  I-STAT CHEM 8, ED - Abnormal; Notable for the following components:   Glucose, Bld 135 (*)    Calcium, Ion 1.12 (*)    TCO2 19 (*)    All other components within normal limits  PROTIME-INR  APTT  ETHANOL  COMPREHENSIVE METABOLIC PANEL  RAPID URINE DRUG SCREEN, HOSP PERFORMED  URINALYSIS, ROUTINE W REFLEX MICROSCOPIC    EKG None  Radiology CT HEAD CODE STROKE WO CONTRAST  Result Date: 05/09/2020 CLINICAL DATA:  Code stroke. EXAM: CT HEAD WITHOUT CONTRAST TECHNIQUE: Contiguous axial images were obtained from the base of the skull through the vertex without intravenous contrast. COMPARISON:  03/13/2020 FINDINGS: Brain: Fall seen subdural hematoma has resolved. There is no acute intracranial hemorrhage, mass effect, or edema. There is no new loss of gray-white differentiation. Chronic infarcts involving bilateral globus pallidi. Ventricles are stable in size. Patchy hypoattenuation in the supratentorial white matter is nonspecific but may reflect mild chronic  microvascular ischemic changes. Vascular: No hyperdense vessel. Skull: Unremarkable. Sinuses/Orbits: Trace mucosal thickening.  No acute orbital finding. Other:  Mastoid air cells are clear. ASPECTS ([place] Stroke Program Early CT Score) - Ganglionic level infarction (caudate, lentiform nuclei, internal capsule, insula, M1-M3 cortex): 7 - Supraganglionic infarction (M4-M6 cortex): 3 Total score (0-10 with 10 being normal): 10 IMPRESSION: No acute intracranial hemorrhage or evidence of acute infarction. ASPECT score is 10. Stable chronic findings detailed above. Electronically Signed   By: Macy Mis M.D.   On: 05/09/2020 12:47    Procedures Procedures (including critical care time)  Medications Ordered in ED Medications - No data to display  ED Course  I have reviewed the triage vital signs and the nursing notes.  Pertinent labs & imaging results that were available during my care of the patient were reviewed by me and considered in my medical decision making (see chart for details).    MDM Rules/Calculators/A&P                      Patient initially called as a code stroke.  I saw her in the CT scanner with Dr. Cheral Marker, history obtained from the code stroke team.  I have personally ordered interpreted reviewed the patient's labs which shows negative UDS, UA negative for infection.  Patient CMP shows elevated blood glucose without significant abnormality.  She has a mildly elevated anion gap.  Patient's ethanol, PT/INR, APTT within normal limits.  CBC shows elevated white blood cell count with lymphocytosis. I reviewed the CT code stroke, MR brain and MR are angio images which show no acute infarction.  EKG shows no arrhythmias, patient appears medically clear for a code stroke.  Her son who is at bedside states that this is not the first time that she has done this.  She has had an abnormal breathing pattern for 20 years and has had multiple work-ups for the same.  He feels like this is all related to psychiatric cause.  He states that she has done this in the past and feels like this will resolve again.  She does not appear to have an intracranial  cause.  Patient appears otherwise appropriate for discharge.  I discussed the case with Dr. Cheral Marker who is in agreement.  I discussed the case with Dr. Jeanell Sparrow who also agrees.  Follow closely with PCP   Final Clinical Impression(s) / ED Diagnoses Final diagnoses:  None    Rx / DC Orders ED Discharge Orders    None       Margarita Mail, PA-C 05/09/20 1546    Pattricia Boss, MD 05/10/20 [phone]

## 2020-05-09 NOTE — ED Notes (Signed)
Patient verbalizes understanding of discharge instructions. Opportunity for questioning and answers were provided. Armband removed by staff, pt discharged from ED via wheelchair to lobby to leave with son.

## 2020-05-10 NOTE — Telephone Encounter (Signed)
New message:   Pt is returning call and states she has no form of transportation to come to the office. I suggested yesterday if not today and she states she can't do it any day. Please advise.

## 2020-05-10 NOTE — Telephone Encounter (Signed)
Tried calling pt to inform her that per Dr. Ronnald Ramp pt should try to come in to be seen today. LVM with pt to please call clinic for a possible apptmnt today for stroke like sxs.

## 2020-05-10 NOTE — Telephone Encounter (Signed)
New message:    Clair Gulling is calling from [facility] states the pt went to the ED due to stroke like symptoms and was sent home. Please advise.

## 2020-05-16 ENCOUNTER — Other Ambulatory Visit: Payer: Self-pay | Admitting: Adult Health

## 2020-05-16 ENCOUNTER — Other Ambulatory Visit: Payer: Self-pay | Admitting: Internal Medicine

## 2020-05-16 DIAGNOSIS — E1169 Type 2 diabetes mellitus with other specified complication: Secondary | ICD-10-CM

## 2020-05-16 DIAGNOSIS — E785 Hyperlipidemia, unspecified: Secondary | ICD-10-CM

## 2020-05-16 DIAGNOSIS — I10 Essential (primary) hypertension: Secondary | ICD-10-CM

## 2020-05-16 NOTE — Telephone Encounter (Signed)
Chart reviewed, no refill needed

## 2020-05-30 ENCOUNTER — Encounter: Payer: Self-pay | Admitting: Internal Medicine

## 2020-05-30 ENCOUNTER — Telehealth: Payer: Self-pay

## 2020-05-30 MED ORDER — DICLOFENAC SODIUM 75 MG PO TBEC: 75.0000 mg | DELAYED_RELEASE_TABLET | Freq: Two times a day (BID) | ORAL | 2 refills | Status: DC

## 2020-05-30 NOTE — Telephone Encounter (Signed)
New message    The patient voiced she wanted Dr. Jenny Reichmann to call in her a new medication for her arthritis   Offer an appt to see MD patient refuse voiced she does not have a way up to the office to see MD and hung up the phone.

## 2020-05-30 NOTE — Telephone Encounter (Signed)
Ok for refill voltaren 75 bid prn - done erx

## 2020-05-30 NOTE — Telephone Encounter (Signed)
Sent this request to Dr. Jenny Reichmann to advise.

## 2020-06-15 ENCOUNTER — Other Ambulatory Visit: Payer: Self-pay | Admitting: Internal Medicine

## 2020-06-15 ENCOUNTER — Other Ambulatory Visit: Payer: Self-pay

## 2020-06-15 DIAGNOSIS — J45909 Unspecified asthma, uncomplicated: Secondary | ICD-10-CM

## 2020-06-15 DIAGNOSIS — J302 Other seasonal allergic rhinitis: Secondary | ICD-10-CM

## 2020-06-15 DIAGNOSIS — J3089 Other allergic rhinitis: Secondary | ICD-10-CM

## 2020-06-15 MED ORDER — ALBUTEROL SULFATE HFA 108 (90 BASE) MCG/ACT IN AERS: 2.0000 | INHALATION_SPRAY | Freq: Four times a day (QID) | RESPIRATORY_TRACT | 0 refills | Status: DC | PRN

## 2020-06-15 MED ORDER — BUDESONIDE-FORMOTEROL FUMARATE 160-4.5 MCG/ACT IN AERO: 2.0000 | INHALATION_SPRAY | Freq: Two times a day (BID) | RESPIRATORY_TRACT | 2 refills | Status: DC

## 2020-06-15 MED ORDER — FLUTICASONE PROPIONATE 50 MCG/ACT NA SUSP: 2.0000 | Freq: Every day | NASAL | 0 refills | Status: DC

## 2020-06-16 ENCOUNTER — Other Ambulatory Visit: Payer: Self-pay | Admitting: Internal Medicine

## 2020-06-16 DIAGNOSIS — J45909 Unspecified asthma, uncomplicated: Secondary | ICD-10-CM

## 2020-06-16 DIAGNOSIS — F5101 Primary insomnia: Secondary | ICD-10-CM

## 2020-06-16 MED ORDER — TRAZODONE HCL 50 MG PO TABS: ORAL_TABLET | ORAL | 5 refills | Status: DC

## 2020-06-16 NOTE — Telephone Encounter (Signed)
Please refill as per office routine med refill policy (all routine meds refilled for 3 mo or monthly per pt preference up to one year from last visit, then month to month grace period for 3 mo, then further med refills will have to be denied)  

## 2020-07-05 ENCOUNTER — Telehealth: Payer: Self-pay

## 2020-07-05 ENCOUNTER — Ambulatory Visit (INDEPENDENT_AMBULATORY_CARE_PROVIDER_SITE_OTHER): Payer: Medicare Other | Admitting: Internal Medicine

## 2020-07-05 ENCOUNTER — Other Ambulatory Visit: Payer: Self-pay

## 2020-07-05 ENCOUNTER — Encounter: Payer: Self-pay | Admitting: Internal Medicine

## 2020-07-05 VITALS — BP 126/70 | HR 79 | Temp 98.5°F | Ht 66.0 in

## 2020-07-05 DIAGNOSIS — E119 Type 2 diabetes mellitus without complications: Secondary | ICD-10-CM

## 2020-07-05 DIAGNOSIS — I10 Essential (primary) hypertension: Secondary | ICD-10-CM

## 2020-07-05 DIAGNOSIS — Z0001 Encounter for general adult medical examination with abnormal findings: Secondary | ICD-10-CM

## 2020-07-05 DIAGNOSIS — S065X9A Traumatic subdural hemorrhage with loss of consciousness of unspecified duration, initial encounter: Secondary | ICD-10-CM

## 2020-07-05 DIAGNOSIS — R296 Repeated falls: Secondary | ICD-10-CM | POA: Diagnosis not present

## 2020-07-05 DIAGNOSIS — Z0000 Encounter for general adult medical examination without abnormal findings: Secondary | ICD-10-CM | POA: Diagnosis not present

## 2020-07-05 DIAGNOSIS — S065XAA Traumatic subdural hemorrhage with loss of consciousness status unknown, initial encounter: Secondary | ICD-10-CM

## 2020-07-05 DIAGNOSIS — R0609 Other forms of dyspnea: Secondary | ICD-10-CM | POA: Diagnosis not present

## 2020-07-05 NOTE — Telephone Encounter (Signed)
New message    Seen today was told by MD nausea medication would be called in. Please advise   [place], [place]

## 2020-07-05 NOTE — Patient Instructions (Signed)
Please continue all other medications as before, and refills have been done if requested.  Please have the pharmacy call with any other refills you may need.  Please continue your efforts at being more active, low cholesterol diet, and weight control.  You are otherwise up to date with prevention measures today.  Please keep your appointments with your specialists as you may have planned  You will be contacted regarding the referral for: [place] are given the Dentist note today  Please make an Appointment to return in 6 months, or sooner if needed

## 2020-07-05 NOTE — Progress Notes (Signed)
Subjective:    Patient ID: Alexandra Wolfe, female    DOB: [birth year], 74 y.o.   MRN: [id number]  HPI  Here for wellness and f/u;  Overall doing ok;  Pt denies Chest pain, worsening SOB, DOE, wheezing, orthopnea, PND, worsening LE edema, palpitations, dizziness or syncope.  Pt denies neurological change such as new headache, facial or extremity weakness.  Pt denies polydipsia, polyuria, or low sugar symptoms. Pt states overall good compliance with treatment and medications, good tolerability, and has been trying to follow appropriate diet.  Pt denies worsening depressive symptoms, suicidal ideation or panic. No fever, night sweats, wt loss, loss of appetite, or other constitutional symptoms.  Pt states good ability with ADL's, has low fall risk, home safety reviewed and adequate, no other significant changes in hearing or vision, and not active with exercise  Has eye doctor appt soon.  Has worsening generalized weakness, and asks for [facility] with PT to start Past Medical History:  Diagnosis Date  . ALLERGIC RHINITIS 04/02/2009   no per pt  . Anxiety   . BACK PAIN 09/27/2008  . BUNIONS, BILATERAL 12/23/2007  . CHEST DISCOMFORT, ATYPICAL 11/07/2009  . Chronic LBP   . COLONIC POLYPS, HX OF 09/13/2007  . CONSTIPATION 09/27/2008  . DEPRESSION 09/13/2007  . Diabetes mellitus    diet controlled  . DYSPNEA ON EXERTION 02/06/2010  . Eustachian tube dysfunction 05/20/2011  . FOOT PAIN, RIGHT 09/27/2008  . HYPERLIPIDEMIA 03/11/2008  . LBP (low back pain) 05/20/2011  . Leukocytosis 11/20/2011  . OSTEOARTHROSIS NOS, LOWER LEG 09/13/2007  . SHOULDER PAIN, LEFT 02/14/2009  . SVT (supraventricular tachycardia) ([place])   . SYMPTOM, PALPITATIONS 09/13/2007  . URINARY INCONTINENCE 08/23/2009  . Vertigo 05/20/2011   Past Surgical History:  Procedure Laterality Date  . BUNIONECTOMY     right  . ccx    . CHOLECYSTECTOMY    . LUMBAR LAMINECTOMY    . LUMBAR LAMINECTOMY    . SHOULDER ARTHROSCOPY  12/13/2011   Procedure:  ARTHROSCOPY SHOULDER;  Surgeon: Augustin Schooling;  Location: [facility];  Service: Orthopedics;  Laterality: Left;  Left Shoulder ArthroscopyDebridement Limited Tenodesis Open Rotator Cuff Repair Spur Removal Right Shoulder Injection     reports that she has quit smoking. She has a 10.00 pack-year smoking history. She has never used smokeless tobacco. She reports that she does not drink alcohol and does not use drugs. family history includes Colon cancer in her mother; Diabetes in an other family member; Diabetes type II in her father; Heart disease in her brother and father. Allergies  Allergen Reactions  . Aripiprazole     REACTION: agitation  . Simvastatin     REACTION: myalgia   Current Outpatient Medications on File Prior to Visit  Medication Sig Dispense Refill  . acetaminophen (TYLENOL) 325 MG tablet Take 2 tablets (650 mg total) by mouth every 6 (six) hours as needed for mild pain or headache.    . albuterol (VENTOLIN HFA) 108 (90 Base) MCG/ACT inhaler Inhale 2 puffs into the lungs every 6 (six) hours as needed for wheezing or shortness of breath. 18 g 0  . atorvastatin (LIPITOR) 80 MG tablet Take 1 tablet (80 mg total) by mouth daily. 30 tablet 0  . budesonide-formoterol (SYMBICORT) 160-4.5 MCG/ACT inhaler Inhale 2 puffs into the lungs 2 (two) times daily. 6 g 2  . Calcium Carbonate (CALCIUM 500 PO) Take 1,000 mg by mouth 2 (two) times daily.     . cetirizine (ZYRTEC) 10 MG  tablet Take 1 tablet (10 mg total) by mouth daily. 30 tablet 11  . diclofenac (VOLTAREN) 75 MG EC tablet TAKE 1 TABLET(75 MG) BY MOUTH TWICE DAILY 60 tablet 2  . Fluoxetine HCl, PMDD, 20 MG TABS fluoxetine 20 mg tablet    . fluticasone (FLONASE) 50 MCG/ACT nasal spray SHAKE LIQUID AND USE 2 SPRAYS IN EACH NOSTRIL DAILY 48 g 1  . JARDIANCE 25 MG TABS tablet Take 25 mg by mouth daily.    [name] levETIRAcetam (KEPPRA) 500 MG tablet Take 1 tablet (500 mg total) by mouth 2 (two) times daily. 180 tablet 1  . levothyroxine  (SYNTHROID) 50 MCG tablet Take 50 mcg by mouth daily.    . meclizine (ANTIVERT) 25 MG tablet Take 1 tablet (25 mg total) by mouth 3 (three) times daily as needed for dizziness. 90 tablet 0  . metFORMIN (GLUMETZA) 500 MG (MOD) 24 hr tablet Take 1 tablet (500 mg total) by mouth 2 (two) times daily with a meal. 60 tablet 0  . metoprolol succinate (TOPROL-XL) 25 MG 24 hr tablet TAKE 1 TABLET(25 MG) BY MOUTH DAILY (Patient taking differently: Take 25 mg by mouth daily. ) 30 tablet 0  . Multiple Vitamin (MULTIVITAMIN WITH MINERALS) TABS tablet Take 1 tablet by mouth daily.    [name] MYRBETRIQ 50 MG TB24 tablet Take 1 tablet (50 mg total) by mouth at bedtime. 30 tablet 0  . NON FORMULARY Heart healthy lcs diet    . NP THYROID 30 MG tablet Take 1 tablet (30 mg total) by mouth daily. 30 tablet 0  . senna-docusate (SENOKOT-S) 8.6-50 MG tablet Take 1 tablet by mouth at bedtime as needed for mild constipation.    . traZODone (DESYREL) 50 MG tablet TAKE 1 TABLET(50 MG) BY MOUTH AT BEDTIME 30 tablet 5   No current facility-administered medications on file prior to visit.   Review of Systems All otherwise neg per pt     Objective:   Physical Exam BP 126/70 (BP Location: Left Arm, Patient Position: Sitting, Cuff Size: Large)   Pulse 79   Temp 98.5 F (36.9 C) (Oral)   Ht 5\' 6"  (1.676 m)   SpO2 97%   BMI 38.54 kg/m  VS noted,  Constitutional: Pt appears in NAD HENT: Head: NCAT.  Right Ear: External ear normal.  Left Ear: External ear normal.  Eyes: . Pupils are equal, round, and reactive to light. Conjunctivae and EOM are normal Nose: without d/c or deformity Neck: Neck supple. Gross normal ROM Cardiovascular: Normal rate and regular rhythm.   Pulmonary/Chest: Effort normal and breath sounds without rales or wheezing.  Abd:  Soft, NT, ND, + BS, no organomegaly Neurological: Pt is alert. At baseline orientation, motor grossly intact Skin: Skin is warm. No rashes, other new lesions, no LE  edema Psychiatric: Pt behavior is normal without agitation  All otherwise neg per pt  Lab Results  Component Value Date   WBC 13.5 (H) 05/09/2020   HGB 13.9 05/09/2020   HCT 41.0 05/09/2020   PLT 352 05/09/2020   GLUCOSE 135 (H) 05/09/2020   CHOL 121 04/04/2020   TRIG 176.0 (H) 04/04/2020   HDL 37.70 (L) 04/04/2020   LDLDIRECT 74.0 01/04/2020   LDLCALC 48 04/04/2020   ALT 21 05/09/2020   AST 27 05/09/2020   NA 138 05/09/2020   K 3.8 05/09/2020   CL 104 05/09/2020   CREATININE 0.60 05/09/2020   BUN 14 05/09/2020   CO2 18 (L) 05/09/2020   TSH 3.93  04/04/2020   INR 1.1 05/09/2020   HGBA1C 6.1 04/04/2020   MICROALBUR <0.7 07/01/2019      Assessment & Plan:

## 2020-07-06 ENCOUNTER — Other Ambulatory Visit: Payer: Self-pay

## 2020-07-06 MED ORDER — ONDANSETRON HCL 4 MG PO TABS: 4.0000 mg | ORAL_TABLET | Freq: Three times a day (TID) | ORAL | 0 refills | Status: DC | PRN

## 2020-07-06 NOTE — Telephone Encounter (Signed)
Sent to pharmacy 

## 2020-07-09 ENCOUNTER — Encounter: Payer: Self-pay | Admitting: Internal Medicine

## 2020-07-09 NOTE — Assessment & Plan Note (Signed)
stable overall by history and exam, recent data reviewed with pt, and pt to continue medical treatment as before,  to f/u any worsening symptoms or concerns  

## 2020-07-09 NOTE — Assessment & Plan Note (Addendum)
stable overall by history and exam, recent data reviewed with pt, and pt to continue medical treatment as before,  to f/u any worsening symptoms or concerns  I spent 31 minutes in addition to time for CPX wellness examination in preparing to see the patient by review of recent labs, imaging and procedures, obtaining and reviewing separately obtained history, communicating with the patient and family or caregiver, ordering medications, tests or procedures, and documenting clinical information in the EHR including the differential Dx, treatment, and any further evaluation and other management of SDH, weakness with falls, dm, sob , htn

## 2020-07-09 NOTE — Assessment & Plan Note (Signed)
With worsening generalized weakness, for [facility] with PT

## 2020-07-09 NOTE — Assessment & Plan Note (Signed)

## 2020-07-12 ENCOUNTER — Telehealth: Payer: Self-pay | Admitting: Internal Medicine

## 2020-07-12 ENCOUNTER — Other Ambulatory Visit: Payer: Self-pay | Admitting: Internal Medicine

## 2020-07-12 DIAGNOSIS — J45909 Unspecified asthma, uncomplicated: Secondary | ICD-10-CM

## 2020-07-12 NOTE — Telephone Encounter (Signed)
FYI: Referral for home health was sent to [facility]. They messaged back stating:  "Although Dr. Jenny Reichmann documented that Ms. Yohannes requested HHPT, when we called yesterday to schedule a PT visit, she refused all services. You may want to f/u with her."  Thanks Cecille Rubin

## 2020-07-12 NOTE — Telephone Encounter (Signed)
Sent to Dr. John. 

## 2020-07-15 ENCOUNTER — Other Ambulatory Visit: Payer: Self-pay | Admitting: Adult Health

## 2020-07-15 DIAGNOSIS — E1169 Type 2 diabetes mellitus with other specified complication: Secondary | ICD-10-CM

## 2020-07-20 DIAGNOSIS — Z1231 Encounter for screening mammogram for malignant neoplasm of breast: Secondary | ICD-10-CM | POA: Diagnosis not present

## 2020-08-03 ENCOUNTER — Other Ambulatory Visit: Payer: Self-pay | Admitting: Internal Medicine

## 2020-08-03 DIAGNOSIS — I10 Essential (primary) hypertension: Secondary | ICD-10-CM

## 2020-08-03 NOTE — Telephone Encounter (Signed)
    Patient calling to report she has no Metoprolol remaining

## 2020-08-14 ENCOUNTER — Other Ambulatory Visit: Payer: Self-pay | Admitting: Internal Medicine

## 2020-08-14 NOTE — Telephone Encounter (Signed)
Please refill as per office routine med refill policy (all routine meds refilled for 3 mo or monthly per pt preference up to one year from last visit, then month to month grace period for 3 mo, then further med refills will have to be denied)  

## 2020-08-15 ENCOUNTER — Other Ambulatory Visit: Payer: Self-pay | Admitting: Internal Medicine

## 2020-08-15 NOTE — Telephone Encounter (Signed)
Please refill as per office routine med refill policy (all routine meds refilled for 3 mo or monthly per pt preference up to one year from last visit, then month to month grace period for 3 mo, then further med refills will have to be denied)  

## 2020-08-24 ENCOUNTER — Other Ambulatory Visit: Payer: Self-pay | Admitting: Internal Medicine

## 2020-08-24 DIAGNOSIS — E785 Hyperlipidemia, unspecified: Secondary | ICD-10-CM

## 2020-08-24 DIAGNOSIS — E1169 Type 2 diabetes mellitus with other specified complication: Secondary | ICD-10-CM

## 2020-08-24 NOTE — Telephone Encounter (Signed)
Please refill as per office routine med refill policy (all routine meds refilled for 3 mo or monthly per pt preference up to one year from last visit, then month to month grace period for 3 mo, then further med refills will have to be denied)  

## 2020-08-30 ENCOUNTER — Telehealth: Payer: Self-pay | Admitting: Internal Medicine

## 2020-08-30 NOTE — Telephone Encounter (Signed)
Alexandra Wolfe with [facility] called and was wondering if a clearance can be faxed to their office stating that the patient can have procedures. She has an appointment 09/13/2020 for a crown.   Fax: [phone]

## 2020-08-30 NOTE — Telephone Encounter (Signed)
Sent to Dr. John. 

## 2020-08-31 NOTE — Telephone Encounter (Signed)
[place] for note to authorize ok for crown procedures

## 2020-09-04 NOTE — Telephone Encounter (Signed)
Letter has been printed, stamped and faxed to [facility] @ [facility].

## 2020-09-14 ENCOUNTER — Other Ambulatory Visit: Payer: Self-pay | Admitting: Internal Medicine

## 2020-09-19 ENCOUNTER — Other Ambulatory Visit: Payer: Self-pay | Admitting: Internal Medicine

## 2020-09-19 DIAGNOSIS — J45909 Unspecified asthma, uncomplicated: Secondary | ICD-10-CM

## 2020-11-10 DIAGNOSIS — N3946 Mixed incontinence: Secondary | ICD-10-CM | POA: Diagnosis not present

## 2020-11-10 DIAGNOSIS — R350 Frequency of micturition: Secondary | ICD-10-CM | POA: Diagnosis not present

## 2020-11-12 ENCOUNTER — Other Ambulatory Visit: Payer: Self-pay | Admitting: Internal Medicine

## 2020-11-12 NOTE — Telephone Encounter (Signed)
Please refill as per office routine med refill policy (all routine meds refilled for 3 mo or monthly per pt preference up to one year from last visit, then month to month grace period for 3 mo, then further med refills will have to be denied)  

## 2020-11-13 ENCOUNTER — Other Ambulatory Visit: Payer: Self-pay | Admitting: Internal Medicine

## 2020-11-13 NOTE — Telephone Encounter (Signed)
Sorry no refill needed 

## 2020-11-13 NOTE — Telephone Encounter (Signed)
Please refill as per office routine med refill policy (all routine meds refilled for 3 mo or monthly per pt preference up to one year from last visit, then month to month grace period for 3 mo, then further med refills will have to be denied)  

## 2020-12-11 ENCOUNTER — Other Ambulatory Visit: Payer: Self-pay

## 2020-12-11 DIAGNOSIS — J302 Other seasonal allergic rhinitis: Secondary | ICD-10-CM

## 2020-12-11 MED ORDER — FLUTICASONE PROPIONATE 50 MCG/ACT NA SUSP: NASAL | 1 refills | Status: DC

## 2020-12-12 ENCOUNTER — Other Ambulatory Visit: Payer: Self-pay | Admitting: Internal Medicine

## 2020-12-12 ENCOUNTER — Telehealth: Payer: Self-pay | Admitting: Internal Medicine

## 2020-12-12 DIAGNOSIS — F5101 Primary insomnia: Secondary | ICD-10-CM

## 2020-12-12 MED ORDER — TRAZODONE HCL 50 MG PO TABS: ORAL_TABLET | ORAL | 5 refills | Status: DC

## 2020-12-12 NOTE — Telephone Encounter (Signed)
Trazodone refilled at [facility]

## 2020-12-12 NOTE — Telephone Encounter (Signed)
Called and spoke with patient to let her know that refill has been sent in. She expressed understanding. Nothing further needed at this time.

## 2020-12-29 DIAGNOSIS — E119 Type 2 diabetes mellitus without complications: Secondary | ICD-10-CM | POA: Diagnosis not present

## 2020-12-29 LAB — HM DIABETES EYE EXAM

## 2021-01-02 DIAGNOSIS — N3946 Mixed incontinence: Secondary | ICD-10-CM | POA: Diagnosis not present

## 2021-01-02 DIAGNOSIS — R350 Frequency of micturition: Secondary | ICD-10-CM | POA: Diagnosis not present

## 2021-01-11 ENCOUNTER — Other Ambulatory Visit: Payer: Self-pay | Admitting: Internal Medicine

## 2021-01-11 DIAGNOSIS — E1169 Type 2 diabetes mellitus with other specified complication: Secondary | ICD-10-CM

## 2021-01-11 DIAGNOSIS — E785 Hyperlipidemia, unspecified: Secondary | ICD-10-CM

## 2021-01-11 NOTE — Telephone Encounter (Signed)
Please refill as per office routine med refill policy (all routine meds refilled for 3 mo or monthly per pt preference up to one year from last visit, then month to month grace period for 3 mo, then further med refills will have to be denied)  

## 2021-01-12 ENCOUNTER — Encounter: Payer: Self-pay | Admitting: Internal Medicine

## 2021-01-12 ENCOUNTER — Ambulatory Visit (INDEPENDENT_AMBULATORY_CARE_PROVIDER_SITE_OTHER): Payer: Medicare Other | Admitting: Internal Medicine

## 2021-01-12 ENCOUNTER — Other Ambulatory Visit: Payer: Self-pay

## 2021-01-12 VITALS — BP 142/76 | HR 72 | Temp 98.0°F | Ht 66.0 in | Wt 240.0 lb

## 2021-01-12 DIAGNOSIS — M170 Bilateral primary osteoarthritis of knee: Secondary | ICD-10-CM | POA: Diagnosis not present

## 2021-01-12 DIAGNOSIS — E119 Type 2 diabetes mellitus without complications: Secondary | ICD-10-CM

## 2021-01-12 DIAGNOSIS — I10 Essential (primary) hypertension: Secondary | ICD-10-CM | POA: Diagnosis not present

## 2021-01-12 MED ORDER — PHENTERMINE HCL 30 MG PO CAPS: 30.0000 mg | ORAL_CAPSULE | ORAL | 2 refills | Status: DC

## 2021-01-12 NOTE — Patient Instructions (Signed)
Please take all new medication as prescribed - the phentermine  Please see Dr Veverly Fells if your weight gets to 220 or lower, for possible consideration for surgury  Please continue all other medications as before, and refills have been done if requested.  Please have the pharmacy call with any other refills you may need.  Please continue your efforts at being more active, low cholesterol diet, and weight control.  Please keep your appointments with your specialists as you may have planned

## 2021-01-12 NOTE — Progress Notes (Signed)
Established Patient Office Visit  Subjective:  Patient ID: Alexandra Wolfe, female    DOB: September 06, 1946  Age: 75 y.o. MRN: [id number]      Chief Complaint: follow up HTN, hyperglycemia, obesity, chronic dyspnea, and bilateral knee pain       HPI:  Alexandra Wolfe is a 75 y.o. female here for above, c/o persistent chronic dyspnea no change but seems more dramatic on walking down hallway with audible breaths and pursed lip exhalations;  Pt states continue to gain wt despite attention to less calories and diet as she is unable to exercise due to worsening bilateral knee pain; has end stage djd and needs bilateral TKR but unable at her current wt.  Pt denies chest pain, wheezing, orthopnea, PND, increased LE swelling, palpitations, dizziness or syncope.   Pt denies new neurological symptoms such as new headache, or facial or extremity weakness or numbness   Pt denies polydipsia, polyuria, also under tx recently with chronic uti, finishing current course antibx now.  Denies urinary symptoms such as dysuria, frequency, urgency, flank pain, hematuria or n/v, fever, chills.    Wt Readings from Last 3 Encounters:  01/12/21 240 lb (108.9 kg)  03/29/20 238 lb 12.8 oz (108.3 kg)  03/21/20 238 lb 12.8 oz (108.3 kg)   BP Readings from Last 3 Encounters:  01/12/21 (!) 142/76  07/05/20 126/70  05/09/20 (!) 143/81         Past Medical History:  Diagnosis Date  . ALLERGIC RHINITIS 04/02/2009   no per pt  . Anxiety   . BACK PAIN 09/27/2008  . BUNIONS, BILATERAL 12/23/2007  . CHEST DISCOMFORT, ATYPICAL 11/07/2009  . Chronic LBP   . COLONIC POLYPS, HX OF 09/13/2007  . CONSTIPATION 09/27/2008  . DEPRESSION 09/13/2007  . Diabetes mellitus    diet controlled  . DYSPNEA ON EXERTION 02/06/2010  . Eustachian tube dysfunction 05/20/2011  . FOOT PAIN, RIGHT 09/27/2008  . HYPERLIPIDEMIA 03/11/2008  . LBP (low back pain) 05/20/2011  . Leukocytosis 11/20/2011  . OSTEOARTHROSIS NOS, LOWER LEG 09/13/2007  . SHOULDER  PAIN, LEFT 02/14/2009  . SVT (supraventricular tachycardia) ([place])   . SYMPTOM, PALPITATIONS 09/13/2007  . URINARY INCONTINENCE 08/23/2009  . Vertigo 05/20/2011   Past Surgical History:  Procedure Laterality Date  . BUNIONECTOMY     right  . ccx    . CHOLECYSTECTOMY    . LUMBAR LAMINECTOMY    . LUMBAR LAMINECTOMY    . SHOULDER ARTHROSCOPY  12/13/2011   Procedure: ARTHROSCOPY SHOULDER;  Surgeon: Augustin Schooling;  Location: [place];  Service: Orthopedics;  Laterality: Left;  Left Shoulder ArthroscopyDebridement Limited Tenodesis Open Rotator Cuff Repair Spur Removal Right Shoulder Injection     reports that she has quit smoking. She has a 10.00 pack-year smoking history. She has never used smokeless tobacco. She reports that she does not drink alcohol and does not use drugs. family history includes Colon cancer in her mother; Diabetes in an other family member; Diabetes type II in her father; Heart disease in her brother and father. Allergies  Allergen Reactions  . Aripiprazole     REACTION: agitation  . Simvastatin     REACTION: myalgia   Current Outpatient Medications on File Prior to Visit  Medication Sig Dispense Refill  . acetaminophen (TYLENOL) 325 MG tablet Take 2 tablets (650 mg total) by mouth every 6 (six) hours as needed for mild pain or headache.    . albuterol (VENTOLIN HFA) 108 (90 Base) MCG/ACT inhaler INHALE  2 PUFFS INTO THE LUNGS EVERY 6 HOURS AS NEEDED FOR WHEEZING OR SHORTNESS OF BREATH 8.5 g 7  . atorvastatin (LIPITOR) 80 MG tablet TAKE 1 TABLET(80 MG) BY MOUTH DAILY 90 tablet 1  . budesonide-formoterol (SYMBICORT) 160-4.5 MCG/ACT inhaler Inhale 2 puffs into the lungs 2 (two) times daily. 6 g 2  . Calcium Carbonate (CALCIUM 500 PO) Take 1,000 mg by mouth 2 (two) times daily.     . cetirizine (ZYRTEC) 10 MG tablet Take 1 tablet (10 mg total) by mouth daily. 30 tablet 11  . diclofenac (VOLTAREN) 75 MG EC tablet TAKE 1 TABLET(75 MG) BY MOUTH TWICE DAILY 180 tablet 1  .  fluticasone (FLONASE) 50 MCG/ACT nasal spray SHAKE LIQUID AND USE 2 SPRAYS IN EACH NOSTRIL DAILY 48 g 1  . JARDIANCE 25 MG TABS tablet TAKE 1 TABLET BY MOUTH DAILY 90 tablet 1  . metFORMIN (GLUMETZA) 500 MG (MOD) 24 hr tablet Take 1 tablet (500 mg total) by mouth 2 (two) times daily with a meal. 60 tablet 0  . metoprolol succinate (TOPROL-XL) 25 MG 24 hr tablet TAKE 1 TABLET(25 MG) BY MOUTH DAILY 90 tablet 2  . Multiple Vitamin (MULTIVITAMIN WITH MINERALS) TABS tablet Take 1 tablet by mouth daily.    . NON FORMULARY Heart healthy lcs diet    . traZODone (DESYREL) 50 MG tablet TAKE 1 TABLET(50 MG) BY MOUTH AT BEDTIME 30 tablet 5  . Fluoxetine HCl, PMDD, 20 MG TABS fluoxetine 20 mg tablet (Patient not taking: Reported on 01/12/2021)    . levETIRAcetam (KEPPRA) 500 MG tablet Take 1 tablet (500 mg total) by mouth 2 (two) times daily. (Patient not taking: Reported on 01/12/2021) 180 tablet 1  . levothyroxine (SYNTHROID) 50 MCG tablet TAKE 1 TABLET(50 MCG) BY MOUTH DAILY (Patient not taking: Reported on 01/12/2021) 90 tablet 1  . meclizine (ANTIVERT) 25 MG tablet Take 1 tablet (25 mg total) by mouth 3 (three) times daily as needed for dizziness. (Patient not taking: Reported on 01/12/2021) 90 tablet 0  . MYRBETRIQ 50 MG TB24 tablet Take 1 tablet (50 mg total) by mouth at bedtime. (Patient not taking: Reported on 01/12/2021) 30 tablet 0  . NP THYROID 30 MG tablet Take 1 tablet (30 mg total) by mouth daily. 30 tablet 0  . ondansetron (ZOFRAN) 4 MG tablet Take 1 tablet (4 mg total) by mouth every 8 (eight) hours as needed for nausea or vomiting. (Patient not taking: Reported on 01/12/2021) 30 tablet 0  . senna-docusate (SENOKOT-S) 8.6-50 MG tablet Take 1 tablet by mouth at bedtime as needed for mild constipation. (Patient not taking: Reported on 01/12/2021)     No current facility-administered medications on file prior to visit.        ROS:  All others reviewed and negative.  Objective        PE:  BP (!) 142/76    Pulse 72   Temp 98 F (36.7 C) (Oral)   Ht 5\' 6"  (1.676 m)   Wt 240 lb (108.9 kg)   SpO2 98%   BMI 38.74 kg/m                 Constitutional: Pt appears in NAD at rest               HENT: Head: NCAT.                Right Ear: External ear normal.  Left Ear: External ear normal.                Eyes: . Pupils are equal, round, and reactive to light. Conjunctivae and EOM are normal               Nose: without d/c or deformity               Neck: Neck supple. Gross normal ROM               Cardiovascular: Normal rate and regular rhythm.                 Pulmonary/Chest: Effort normal and breath sounds without rales or wheezing.                Abd:  Soft, NT, ND, + BS, no organomegaly               Stand stiff and slowly due to knee pain, unsteady without cane               Neurological: Pt is alert. At baseline orientation, motor grossly intact               Skin: Skin is warm. No rashes, no other new lesions, LE edema - trace bilat               Psychiatric: Pt behavior is normal without agitation   Assessment/Plan:  Alexandra Wolfe is a 75 y.o. White or Caucasian [1] female with  has a past medical history of ALLERGIC RHINITIS (04/02/2009), Anxiety, BACK PAIN (09/27/2008), BUNIONS, BILATERAL (12/23/2007), CHEST DISCOMFORT, ATYPICAL (11/07/2009), Chronic LBP, COLONIC POLYPS, HX OF (09/13/2007), CONSTIPATION (09/27/2008), DEPRESSION (09/13/2007), Diabetes mellitus, DYSPNEA ON EXERTION (02/06/2010), Eustachian tube dysfunction (05/20/2011), FOOT PAIN, RIGHT (09/27/2008), HYPERLIPIDEMIA (03/11/2008), LBP (low back pain) (05/20/2011), Leukocytosis (11/20/2011), OSTEOARTHROSIS NOS, LOWER LEG (09/13/2007), SHOULDER PAIN, LEFT (02/14/2009), SVT (supraventricular tachycardia) ([place]), SYMPTOM, PALPITATIONS (09/13/2007), URINARY INCONTINENCE (08/23/2009), and Vertigo (05/20/2011).   Micro: none  Cardiac tracings I have personally interpreted today:  none  Pertinent Radiological findings (summarize):  none   Lab Results  Component Value Date   WBC 13.5 (H) 05/09/2020   HGB 13.9 05/09/2020   HCT 41.0 05/09/2020   PLT 352 05/09/2020   GLUCOSE 99 01/12/2021   CHOL 154 01/12/2021   TRIG 384 (H) 01/12/2021   HDL 36 (L) 01/12/2021   LDLDIRECT 74.0 01/04/2020   LDLCALC 72 01/12/2021   ALT 32 (H) 01/12/2021   AST 35 01/12/2021   NA 138 01/12/2021   K 4.6 01/12/2021   CL 102 01/12/2021   CREATININE 1.11 (H) 01/12/2021   BUN 24 01/12/2021   CO2 24 01/12/2021   TSH 3.93 04/04/2020   INR 1.1 05/09/2020   HGBA1C 6.5 (H) 01/12/2021   MICROALBUR <0.7 07/01/2019     Assessment & Plan:   Problem List Items Addressed This Visit      High   Morbid obesity ([place])    conts to gain wt with worsening knee pain and debility, unable to otherwise be more active and lose wt due to knee djd, but unable to have surgury due to weight excess;  Ok for phentermine 30 qd for 3 mo hopefully for goal loss to 210 or less, to help reverse this circular worsening situation; pt plans to f/u with ortho for knees when wt is lost for consideration of knee TKR      Relevant Medications   phentermine 30 MG capsule  Medium   Essential hypertension    BP Readings from Last 3 Encounters:  01/12/21 (!) 142/76  07/05/20 126/70  05/09/20 (!) 143/81   Stable, pt to continue medical treatment - toprol       Diabetes mellitus type II, non insulin dependent ([place]) - Primary    Lab Results  Component Value Date   HGBA1C 6.5 (H) 01/12/2021   Stable, pt to continue current medical treatment jardiance, metformin  Current Outpatient Medications (Endocrine & Metabolic):  [name]  JARDIANCE 25 MG TABS tablet, TAKE 1 TABLET BY MOUTH DAILY .  metFORMIN (GLUMETZA) 500 MG (MOD) 24 hr tablet, Take 1 tablet (500 mg total) by mouth 2 (two) times daily with a meal. .  levothyroxine (SYNTHROID) 50 MCG tablet, TAKE 1 TABLET(50 MCG) BY MOUTH DAILY (Patient not taking: Reported on 01/12/2021) .  NP THYROID 30 MG tablet, Take 1  tablet (30 mg total) by mouth daily.  Current Outpatient Medications (Cardiovascular):  .  atorvastatin (LIPITOR) 80 MG tablet, TAKE 1 TABLET(80 MG) BY MOUTH DAILY .  metoprolol succinate (TOPROL-XL) 25 MG 24 hr tablet, TAKE 1 TABLET(25 MG) BY MOUTH DAILY  Current Outpatient Medications (Respiratory):  .  albuterol (VENTOLIN HFA) 108 (90 Base) MCG/ACT inhaler, INHALE 2 PUFFS INTO THE LUNGS EVERY 6 HOURS AS NEEDED FOR WHEEZING OR SHORTNESS OF BREATH .  budesonide-formoterol (SYMBICORT) 160-4.5 MCG/ACT inhaler, Inhale 2 puffs into the lungs 2 (two) times daily. .  cetirizine (ZYRTEC) 10 MG tablet, Take 1 tablet (10 mg total) by mouth daily. .  fluticasone (FLONASE) 50 MCG/ACT nasal spray, SHAKE LIQUID AND USE 2 SPRAYS IN EACH NOSTRIL DAILY  Current Outpatient Medications (Analgesics):  .  acetaminophen (TYLENOL) 325 MG tablet, Take 2 tablets (650 mg total) by mouth every 6 (six) hours as needed for mild pain or headache. .  diclofenac (VOLTAREN) 75 MG EC tablet, TAKE 1 TABLET(75 MG) BY MOUTH TWICE DAILY   Current Outpatient Medications (Other):  [name]  Calcium Carbonate (CALCIUM 500 PO), Take 1,000 mg by mouth 2 (two) times daily.  .  Multiple Vitamin (MULTIVITAMIN WITH MINERALS) TABS tablet, Take 1 tablet by mouth daily. .  NON FORMULARY, Heart healthy lcs diet .  phentermine 30 MG capsule, Take 1 capsule (30 mg total) by mouth every morning. .  traZODone (DESYREL) 50 MG tablet, TAKE 1 TABLET(50 MG) BY MOUTH AT BEDTIME .  Fluoxetine HCl, PMDD, 20 MG TABS, fluoxetine 20 mg tablet (Patient not taking: Reported on 01/12/2021) .  levETIRAcetam (KEPPRA) 500 MG tablet, Take 1 tablet (500 mg total) by mouth 2 (two) times daily. (Patient not taking: Reported on 01/12/2021) .  meclizine (ANTIVERT) 25 MG tablet, Take 1 tablet (25 mg total) by mouth 3 (three) times daily as needed for dizziness. (Patient not taking: Reported on 01/12/2021) .  MYRBETRIQ 50 MG TB24 tablet, Take 1 tablet (50 mg total) by mouth at  bedtime. (Patient not taking: Reported on 01/12/2021) .  ondansetron (ZOFRAN) 4 MG tablet, Take 1 tablet (4 mg total) by mouth every 8 (eight) hours as needed for nausea or vomiting. (Patient not taking: Reported on 01/12/2021) .  senna-docusate (SENOKOT-S) 8.6-50 MG tablet, Take 1 tablet by mouth at bedtime as needed for mild constipation. (Patient not taking: Reported on 01/12/2021)       Relevant Orders   Hepatic function panel (Completed)   Basic metabolic panel (Completed)   Lipid panel (Completed)   Hemoglobin A1c (Completed)   Degenerative arthritis of knee, bilateral    For  f/u ortho as above         Meds ordered this encounter  Medications  . phentermine 30 MG capsule    Sig: Take 1 capsule (30 mg total) by mouth every morning.    Dispense:  30 capsule    Refill:  2    Follow-up: Return in about 6 months (around 07/12/2021).   Cathlean Cower, MD 01/13/2021 2:44 PM [facility]

## 2021-01-13 ENCOUNTER — Encounter: Payer: Self-pay | Admitting: Internal Medicine

## 2021-01-13 LAB — BASIC METABOLIC PANEL
BUN/Creatinine Ratio: 22 (calc) (ref 6–22)
BUN: 24 mg/dL (ref 7–25)
CO2: 24 mmol/L (ref 20–32)
Calcium: 10.4 mg/dL (ref 8.6–10.4)
Chloride: 102 mmol/L (ref 98–110)
Creat: 1.11 mg/dL — ABNORMAL HIGH (ref 0.60–0.93)
Glucose, Bld: 99 mg/dL (ref 65–99)
Potassium: 4.6 mmol/L (ref 3.5–5.3)
Sodium: 138 mmol/L (ref 135–146)

## 2021-01-13 LAB — LIPID PANEL
Cholesterol: 154 mg/dL (ref <200)
HDL: 36 mg/dL — ABNORMAL LOW (ref >=50)
LDL Cholesterol (Calc): 72 mg/dL (calc)
Non-HDL Cholesterol (Calc): 118 mg/dL (calc) (ref <130)
Total CHOL/HDL Ratio: 4.3 (calc) (ref <5.0)
Triglycerides: 384 mg/dL — ABNORMAL HIGH (ref <150)

## 2021-01-13 LAB — HEPATIC FUNCTION PANEL
AG Ratio: 1.9 (calc) (ref 1.0–2.5)
ALT: 32 U/L — ABNORMAL HIGH (ref 6–29)
AST: 35 U/L (ref 10–35)
Albumin: 4.4 g/dL (ref 3.6–5.1)
Alkaline phosphatase (APISO): 79 U/L (ref 37–153)
Bilirubin, Direct: 0.1 mg/dL (ref 0.0–0.2)
Globulin: 2.3 g/dL (calc) (ref 1.9–3.7)
Indirect Bilirubin: 0.3 mg/dL (calc) (ref 0.2–1.2)
Total Bilirubin: 0.4 mg/dL (ref 0.2–1.2)
Total Protein: 6.7 g/dL (ref 6.1–8.1)

## 2021-01-13 LAB — HEMOGLOBIN A1C
Hgb A1c MFr Bld: 6.5 % of total Hgb — ABNORMAL HIGH (ref <5.7)
Mean Plasma Glucose: 140 mg/dL
eAG (mmol/L): 7.7 mmol/L

## 2021-01-13 NOTE — Assessment & Plan Note (Signed)
Lab Results  Component Value Date   HGBA1C 6.5 (H) 01/12/2021   Stable, pt to continue current medical treatment jardiance, metformin  Current Outpatient Medications (Endocrine & Metabolic):  [name]  JARDIANCE 25 MG TABS tablet, TAKE 1 TABLET BY MOUTH DAILY .  metFORMIN (GLUMETZA) 500 MG (MOD) 24 hr tablet, Take 1 tablet (500 mg total) by mouth 2 (two) times daily with a meal. .  levothyroxine (SYNTHROID) 50 MCG tablet, TAKE 1 TABLET(50 MCG) BY MOUTH DAILY (Patient not taking: Reported on 01/12/2021) .  NP THYROID 30 MG tablet, Take 1 tablet (30 mg total) by mouth daily.  Current Outpatient Medications (Cardiovascular):  .  atorvastatin (LIPITOR) 80 MG tablet, TAKE 1 TABLET(80 MG) BY MOUTH DAILY .  metoprolol succinate (TOPROL-XL) 25 MG 24 hr tablet, TAKE 1 TABLET(25 MG) BY MOUTH DAILY  Current Outpatient Medications (Respiratory):  .  albuterol (VENTOLIN HFA) 108 (90 Base) MCG/ACT inhaler, INHALE 2 PUFFS INTO THE LUNGS EVERY 6 HOURS AS NEEDED FOR WHEEZING OR SHORTNESS OF BREATH .  budesonide-formoterol (SYMBICORT) 160-4.5 MCG/ACT inhaler, Inhale 2 puffs into the lungs 2 (two) times daily. .  cetirizine (ZYRTEC) 10 MG tablet, Take 1 tablet (10 mg total) by mouth daily. .  fluticasone (FLONASE) 50 MCG/ACT nasal spray, SHAKE LIQUID AND USE 2 SPRAYS IN EACH NOSTRIL DAILY  Current Outpatient Medications (Analgesics):  .  acetaminophen (TYLENOL) 325 MG tablet, Take 2 tablets (650 mg total) by mouth every 6 (six) hours as needed for mild pain or headache. .  diclofenac (VOLTAREN) 75 MG EC tablet, TAKE 1 TABLET(75 MG) BY MOUTH TWICE DAILY   Current Outpatient Medications (Other):  [name]  Calcium Carbonate (CALCIUM 500 PO), Take 1,000 mg by mouth 2 (two) times daily.  .  Multiple Vitamin (MULTIVITAMIN WITH MINERALS) TABS tablet, Take 1 tablet by mouth daily. .  NON FORMULARY, Heart healthy lcs diet .  phentermine 30 MG capsule, Take 1 capsule (30 mg total) by mouth every morning. .  traZODone (DESYREL)  50 MG tablet, TAKE 1 TABLET(50 MG) BY MOUTH AT BEDTIME .  Fluoxetine HCl, PMDD, 20 MG TABS, fluoxetine 20 mg tablet (Patient not taking: Reported on 01/12/2021) .  levETIRAcetam (KEPPRA) 500 MG tablet, Take 1 tablet (500 mg total) by mouth 2 (two) times daily. (Patient not taking: Reported on 01/12/2021) .  meclizine (ANTIVERT) 25 MG tablet, Take 1 tablet (25 mg total) by mouth 3 (three) times daily as needed for dizziness. (Patient not taking: Reported on 01/12/2021) .  MYRBETRIQ 50 MG TB24 tablet, Take 1 tablet (50 mg total) by mouth at bedtime. (Patient not taking: Reported on 01/12/2021) .  ondansetron (ZOFRAN) 4 MG tablet, Take 1 tablet (4 mg total) by mouth every 8 (eight) hours as needed for nausea or vomiting. (Patient not taking: Reported on 01/12/2021) .  senna-docusate (SENOKOT-S) 8.6-50 MG tablet, Take 1 tablet by mouth at bedtime as needed for mild constipation. (Patient not taking: Reported on 01/12/2021)

## 2021-01-13 NOTE — Assessment & Plan Note (Signed)
For f/u ortho as above

## 2021-01-13 NOTE — Assessment & Plan Note (Signed)
conts to gain wt with worsening knee pain and debility, unable to otherwise be more active and lose wt due to knee djd, but unable to have surgury due to weight excess;  Ok for phentermine 30 qd for 3 mo hopefully for goal loss to 210 or less, to help reverse this circular worsening situation; pt plans to f/u with ortho for knees when wt is lost for consideration of knee TKR

## 2021-01-13 NOTE — Assessment & Plan Note (Signed)
BP Readings from Last 3 Encounters:  01/12/21 (!) 142/76  07/05/20 126/70  05/09/20 (!) 143/81   Stable, pt to continue medical treatment - toprol

## 2021-01-28 ENCOUNTER — Other Ambulatory Visit: Payer: Self-pay | Admitting: Internal Medicine

## 2021-02-10 ENCOUNTER — Other Ambulatory Visit: Payer: Self-pay | Admitting: Internal Medicine

## 2021-02-10 NOTE — Telephone Encounter (Signed)
Please refill as per office routine med refill policy (all routine meds refilled for 3 mo or monthly per pt preference up to one year from last visit, then month to month grace period for 3 mo, then further med refills will have to be denied)  

## 2021-02-14 DIAGNOSIS — R350 Frequency of micturition: Secondary | ICD-10-CM | POA: Diagnosis not present

## 2021-02-14 DIAGNOSIS — N3946 Mixed incontinence: Secondary | ICD-10-CM | POA: Diagnosis not present

## 2021-03-12 ENCOUNTER — Other Ambulatory Visit: Payer: Self-pay | Admitting: Internal Medicine

## 2021-03-12 MED ORDER — DICLOFENAC SODIUM 75 MG PO TBEC: DELAYED_RELEASE_TABLET | ORAL | 2 refills | Status: DC

## 2021-03-21 ENCOUNTER — Encounter: Payer: Self-pay | Admitting: Internal Medicine

## 2021-03-21 ENCOUNTER — Other Ambulatory Visit: Payer: Self-pay

## 2021-03-21 ENCOUNTER — Ambulatory Visit (INDEPENDENT_AMBULATORY_CARE_PROVIDER_SITE_OTHER): Payer: Medicare Other

## 2021-03-21 ENCOUNTER — Ambulatory Visit (INDEPENDENT_AMBULATORY_CARE_PROVIDER_SITE_OTHER): Payer: Medicare Other | Admitting: Internal Medicine

## 2021-03-21 VITALS — BP 120/66 | HR 80 | Ht 66.0 in | Wt 222.0 lb

## 2021-03-21 VITALS — BP 120/66 | HR 80 | Temp 97.6°F | Ht 66.0 in | Wt 222.0 lb

## 2021-03-21 DIAGNOSIS — L309 Dermatitis, unspecified: Secondary | ICD-10-CM

## 2021-03-21 DIAGNOSIS — G8929 Other chronic pain: Secondary | ICD-10-CM

## 2021-03-21 DIAGNOSIS — E119 Type 2 diabetes mellitus without complications: Secondary | ICD-10-CM

## 2021-03-21 DIAGNOSIS — Z0000 Encounter for general adult medical examination without abnormal findings: Secondary | ICD-10-CM | POA: Diagnosis not present

## 2021-03-21 DIAGNOSIS — M25562 Pain in left knee: Secondary | ICD-10-CM

## 2021-03-21 DIAGNOSIS — R21 Rash and other nonspecific skin eruption: Secondary | ICD-10-CM | POA: Diagnosis not present

## 2021-03-21 DIAGNOSIS — S069X9A Unspecified intracranial injury with loss of consciousness of unspecified duration, initial encounter: Secondary | ICD-10-CM | POA: Insufficient documentation

## 2021-03-21 DIAGNOSIS — E7800 Pure hypercholesterolemia, unspecified: Secondary | ICD-10-CM

## 2021-03-21 DIAGNOSIS — Z6837 Body mass index (BMI) 37.0-37.9, adult: Secondary | ICD-10-CM | POA: Diagnosis not present

## 2021-03-21 DIAGNOSIS — S069XAA Unspecified intracranial injury with loss of consciousness status unknown, initial encounter: Secondary | ICD-10-CM

## 2021-03-21 HISTORY — DX: Unspecified intracranial injury with loss of consciousness status unknown, initial encounter: S06.9XAA

## 2021-03-21 MED ORDER — PREDNISONE 10 MG PO TABS: ORAL_TABLET | ORAL | 0 refills | Status: DC

## 2021-03-21 MED ORDER — TRIAMCINOLONE ACETONIDE 0.1 % EX CREA: 1.0000 "application " | TOPICAL_CREAM | Freq: Two times a day (BID) | CUTANEOUS | 1 refills | Status: AC

## 2021-03-21 MED ORDER — METHYLPREDNISOLONE ACETATE 80 MG/ML IJ SUSP
80.0000 mg | Freq: Once | INTRAMUSCULAR | Status: AC
  Administered 2021-03-21: 80 mg via INTRAMUSCULAR

## 2021-03-21 NOTE — Progress Notes (Signed)
Patient ID: Alexandra Wolfe, female   DOB: 1946-04-05, 75 y.o.   MRN: [id number]        Chief Complaint: dry scaly patches to scalp, face and torso       HPI:  Alexandra Wolfe is a 75 y.o. female here with c/o above, worsening in the past 2 weeks with the allergy season worsening, marked itching but no fever, chills, redness or pain.  Pt is fortunately losing with the phentermine without significant side effects and is quite pleased.  Did recently however unfortunately lose the 2 lower incisors due to infection, and will likely need implants later this year.  Pt denies polydipsia, polyuria, and with wt loss, the cbg's have come down nicely to the lower 100s or even slightly lower, though denies significant low sugar symptoms.  Has been trying to follow the lower chol DM diet better as well.  Denies new focal neuro s/s.   Pt denies fever, wt loss, night sweats, loss of appetite, or other constitutional symptoms  Does have ongoing chronic left knee pain, but with the wt loss, plans to f/u with ortho soon to consider having the left knee TKR as she may now qualify.   No other new complaints   Wt Readings from Last 3 Encounters:  03/21/21 222 lb (100.7 kg)  03/21/21 222 lb (100.7 kg)  01/12/21 240 lb (108.9 kg)   BP Readings from Last 3 Encounters:  03/21/21 120/66  03/21/21 120/66  01/12/21 (!) 142/76         Past Medical History:  Diagnosis Date  . ALLERGIC RHINITIS 04/02/2009   no per pt  . Anxiety   . BACK PAIN 09/27/2008  . BUNIONS, BILATERAL 12/23/2007  . CHEST DISCOMFORT, ATYPICAL 11/07/2009  . Chronic LBP   . COLONIC POLYPS, HX OF 09/13/2007  . CONSTIPATION 09/27/2008  . DEPRESSION 09/13/2007  . Diabetes mellitus    diet controlled  . DYSPNEA ON EXERTION 02/06/2010  . Eustachian tube dysfunction 05/20/2011  . FOOT PAIN, RIGHT 09/27/2008  . HYPERLIPIDEMIA 03/11/2008  . LBP (low back pain) 05/20/2011  . Leukocytosis 11/20/2011  . OSTEOARTHROSIS NOS, LOWER LEG 09/13/2007  . SHOULDER  PAIN, LEFT 02/14/2009  . SVT (supraventricular tachycardia) ([place])   . SYMPTOM, PALPITATIONS 09/13/2007  . URINARY INCONTINENCE 08/23/2009  . Vertigo 05/20/2011   Past Surgical History:  Procedure Laterality Date  . BUNIONECTOMY     right  . ccx    . CHOLECYSTECTOMY    . LUMBAR LAMINECTOMY    . LUMBAR LAMINECTOMY    . SHOULDER ARTHROSCOPY  12/13/2011   Procedure: ARTHROSCOPY SHOULDER;  Surgeon: Augustin Schooling;  Location: [facility];  Service: Orthopedics;  Laterality: Left;  Left Shoulder ArthroscopyDebridement Limited Tenodesis Open Rotator Cuff Repair Spur Removal Right Shoulder Injection     reports that she has quit smoking. She has a 10.00 pack-year smoking history. She has never used smokeless tobacco. She reports that she does not drink alcohol and does not use drugs. family history includes Colon cancer in her mother; Diabetes in an other family member; Diabetes type II in her father; Heart disease in her brother and father. Allergies  Allergen Reactions  . Aripiprazole     REACTION: agitation  . Simvastatin     REACTION: myalgia   Current Outpatient Medications on File Prior to Visit  Medication Sig Dispense Refill  . acetaminophen (TYLENOL) 325 MG tablet Take 2 tablets (650 mg total) by mouth every 6 (six) hours as needed for mild  pain or headache.    . albuterol (VENTOLIN HFA) 108 (90 Base) MCG/ACT inhaler INHALE 2 PUFFS INTO THE LUNGS EVERY 6 HOURS AS NEEDED FOR WHEEZING OR SHORTNESS OF BREATH 8.5 g 7  . atorvastatin (LIPITOR) 80 MG tablet TAKE 1 TABLET(80 MG) BY MOUTH DAILY 90 tablet 1  . budesonide-formoterol (SYMBICORT) 160-4.5 MCG/ACT inhaler Inhale 2 puffs into the lungs 2 (two) times daily. 6 g 2  . Calcium Carbonate (CALCIUM 500 PO) Take 1,000 mg by mouth 2 (two) times daily.     . cetirizine (ZYRTEC) 10 MG tablet Take 1 tablet (10 mg total) by mouth daily. 30 tablet 11  . diclofenac (VOLTAREN) 75 MG EC tablet TAKE 1 TABLET(75 MG) BY MOUTH TWICE DAILY 180 tablet 2  .  Fluoxetine HCl, PMDD, 20 MG TABS     . fluticasone (FLONASE) 50 MCG/ACT nasal spray SHAKE LIQUID AND USE 2 SPRAYS IN EACH NOSTRIL DAILY 48 g 1  . levothyroxine (SYNTHROID) 50 MCG tablet TAKE 1 TABLET(50 MCG) BY MOUTH DAILY 90 tablet 1  . meclizine (ANTIVERT) 25 MG tablet Take 1 tablet (25 mg total) by mouth 3 (three) times daily as needed for dizziness. 90 tablet 0  . metFORMIN (GLUMETZA) 500 MG (MOD) 24 hr tablet Take 1 tablet (500 mg total) by mouth 2 (two) times daily with a meal. 60 tablet 0  . metoprolol succinate (TOPROL-XL) 25 MG 24 hr tablet TAKE 1 TABLET(25 MG) BY MOUTH DAILY 90 tablet 2  . Multiple Vitamin (MULTIVITAMIN WITH MINERALS) TABS tablet Take 1 tablet by mouth daily.    [name] MYRBETRIQ 50 MG TB24 tablet Take 1 tablet (50 mg total) by mouth at bedtime. 30 tablet 0  . NON FORMULARY Heart healthy lcs diet    . NP THYROID 30 MG tablet Take 1 tablet (30 mg total) by mouth daily. 30 tablet 0  . ondansetron (ZOFRAN) 4 MG tablet Take 1 tablet (4 mg total) by mouth every 8 (eight) hours as needed for nausea or vomiting. 30 tablet 0  . phentermine 30 MG capsule Take 1 capsule (30 mg total) by mouth every morning. 30 capsule 2  . senna-docusate (SENOKOT-S) 8.6-50 MG tablet Take 1 tablet by mouth at bedtime as needed for mild constipation.    . traZODone (DESYREL) 50 MG tablet TAKE 1 TABLET(50 MG) BY MOUTH AT BEDTIME 30 tablet 5  . aspirin 81 MG EC tablet Take by mouth.    . ergocalciferol (VITAMIN D2) 1.25 MG (50000 UT) capsule Take 1 tablet by mouth daily.    [name] levETIRAcetam (KEPPRA) 500 MG tablet Take 1 tablet (500 mg total) by mouth 2 (two) times daily. (Patient not taking: Reported on 03/21/2021) 180 tablet 1  . oxybutynin (DITROPAN-XL) 10 MG 24 hr tablet Take by mouth.     No current facility-administered medications on file prior to visit.        ROS:  All others reviewed and negative.  Objective        PE:  BP 120/66 (BP Location: Left Arm, Patient Position: Sitting, Cuff Size:  Large)   Pulse 80   Ht 5\' 6"  (1.676 m)   Wt 222 lb (100.7 kg)   SpO2 97%   BMI 35.83 kg/m                 Constitutional: Pt appears in NAD               HENT: Head: NCAT.  Right Ear: External ear normal.                 Left Ear: External ear normal.                Eyes: . Pupils are equal, round, and reactive to light. Conjunctivae and EOM are normal               Nose: without d/c or deformity               Neck: Neck supple. Gross normal ROM               Cardiovascular: Normal rate and regular rhythm.                 Pulmonary/Chest: Effort normal and breath sounds without rales or wheezing.                Abd:  Soft, NT, ND, + BS, no organomegaly               Neurological: Pt is alert. At baseline orientation, motor grossly intact               Skin: LE edema - none, but has multiple non tender eczematous lesions to scalp face and torso                Psychiatric: Pt behavior is normal without agitation, mild nervous   Micro: none  Cardiac tracings I have personally interpreted today:  none  Pertinent Radiological findings (summarize): none   Lab Results  Component Value Date   WBC 13.5 (H) 05/09/2020   HGB 13.9 05/09/2020   HCT 41.0 05/09/2020   PLT 352 05/09/2020   GLUCOSE 99 01/12/2021   CHOL 154 01/12/2021   TRIG 384 (H) 01/12/2021   HDL 36 (L) 01/12/2021   LDLDIRECT 74.0 01/04/2020   LDLCALC 72 01/12/2021   ALT 32 (H) 01/12/2021   AST 35 01/12/2021   NA 138 01/12/2021   K 4.6 01/12/2021   CL 102 01/12/2021   CREATININE 1.11 (H) 01/12/2021   BUN 24 01/12/2021   CO2 24 01/12/2021   TSH 3.93 04/04/2020   INR 1.1 05/09/2020   HGBA1C 6.5 (H) 01/12/2021   MICROALBUR <0.7 07/01/2019   Assessment/Plan:  Alexandra Wolfe is a 75 y.o. White or Caucasian [1] female with  has a past medical history of ALLERGIC RHINITIS (04/02/2009), Anxiety, BACK PAIN (09/27/2008), BUNIONS, BILATERAL (12/23/2007), CHEST DISCOMFORT, ATYPICAL (11/07/2009), Chronic LBP,  COLONIC POLYPS, HX OF (09/13/2007), CONSTIPATION (09/27/2008), DEPRESSION (09/13/2007), Diabetes mellitus, DYSPNEA ON EXERTION (02/06/2010), Eustachian tube dysfunction (05/20/2011), FOOT PAIN, RIGHT (09/27/2008), HYPERLIPIDEMIA (03/11/2008), LBP (low back pain) (05/20/2011), Leukocytosis (11/20/2011), OSTEOARTHROSIS NOS, LOWER LEG (09/13/2007), SHOULDER PAIN, LEFT (02/14/2009), SVT (supraventricular tachycardia) ([place]), SYMPTOM, PALPITATIONS (09/13/2007), URINARY INCONTINENCE (08/23/2009), and Vertigo (05/20/2011).  Rash C/w eczema flare  - for depomedrom IM 80, topical steroid prn,  to f/u any worsening symptoms or concerns  Body mass index (BMI) 37.0-37.9, adult Improving with phentermine and tolerating well, cont med as is  Left knee pain Pt encouraged to f/u with ortho after further wt loss to consider eligibility for left knee TKR, as the phentermine cannot be indefinite and before any wt regain  Diabetes mellitus type II, non insulin dependent ([place]) Lab Results  Component Value Date   HGBA1C 6.5 (H) 01/12/2021   Stable, pt to continue current medical treatment - metformiin and ok to d/c jardiance as she plans to continue to lose wt as  well   Hyperlipidemia Lab Results  Component Value Date   LDLCALC 72 01/12/2021   Stable, pt to continue current statin lipitor 80   Followup: Return in about 6 months (around 09/20/2021).  Cathlean Cower, MD 03/24/2021 9:53 PM [facility]

## 2021-03-21 NOTE — Patient Instructions (Signed)
Ok to stop the jardiance with your weight loss  You had the steroid shot today  Please take all new medication as prescribed - the prednisone for several days, as well as the triamcinolone cream  Please continue all other medications as before, and refills have been done if requested.  Please have the pharmacy call with any other refills you may need.  Please continue your efforts at being more active, low cholesterol diet, and weight control.  You are otherwise up to date with prevention measures today.  Please keep your appointments with your specialists as you may have planned  Please make sure to see your orthopedic asap since your weight loss is so successful that you may qualify for the left knee surgury

## 2021-03-21 NOTE — Patient Instructions (Signed)
Alexandra Wolfe , Thank you for taking time to come for your Medicare Wellness Visit. I appreciate your ongoing commitment to your health goals. Please review the following plan we discussed and let me know if I can assist you in the future.   Screening recommendations/referrals: Colonoscopy: 03/20/2012; due every 10 years Mammogram: 07/20/2020 Bone Density: 04/16/2016 (completed) Recommended yearly ophthalmology/optometry visit for glaucoma screening and checkup Recommended yearly dental visit for hygiene and checkup  Vaccinations: Influenza vaccine: overdue Pneumococcal vaccine: 11/09/2012, 03/01/2015 Tdap vaccine: 11/09/2012; due every 10 years Shingles vaccine: never done; can check with local pharmacy.   Covid-19: 03/15/2020, 04/02/2020  Advanced directives: Please bring a copy of your health care power of attorney and living will to the office at your convenience.  Conditions/risks identified: Yes; Reviewed health maintenance screenings with patient today and relevant education, vaccines, and/or referrals were provided. Please continue to do your personal lifestyle choices by: daily care of teeth and gums, regular physical activity (goal should be 5 days a week for 30 minutes), eat a healthy diet, avoid tobacco and drug use, limiting any alcohol intake, taking a low-dose aspirin (if not allergic or have been advised by your provider otherwise) and taking vitamins and minerals as recommended by your provider. Continue doing brain stimulating activities (puzzles, reading, adult coloring books, staying active) to keep memory sharp. Continue to eat heart healthy diet (full of fruits, vegetables, whole grains, lean protein, water--limit salt, fat, and sugar intake) and increase physical activity as tolerated.  Next appointment: Please schedule your next Medicare Wellness Visit with your Nurse Health Advisor in 1 year by calling [phone].   Preventive Care 80 Years and Older, Female Preventive care  refers to lifestyle choices and visits with your health care provider that can promote health and wellness. What does preventive care include?  A yearly physical exam. This is also called an annual well check.  Dental exams once or twice a year.  Routine eye exams. Ask your health care provider how often you should have your eyes checked.  Personal lifestyle choices, including:  Daily care of your teeth and gums.  Regular physical activity.  Eating a healthy diet.  Avoiding tobacco and drug use.  Limiting alcohol use.  Practicing safe sex.  Taking low-dose aspirin every day.  Taking vitamin and mineral supplements as recommended by your health care provider. What happens during an annual well check? The services and screenings done by your health care provider during your annual well check will depend on your age, overall health, lifestyle risk factors, and family history of disease. Counseling  Your health care provider may ask you questions about your:  Alcohol use.  Tobacco use.  Drug use.  Emotional well-being.  Home and relationship well-being.  Sexual activity.  Eating habits.  History of falls.  Memory and ability to understand (cognition).  Work and work Statistician.  Reproductive health. Screening  You may have the following tests or measurements:  Height, weight, and BMI.  Blood pressure.  Lipid and cholesterol levels. These may be checked every 5 years, or more frequently if you are over 10 years old.  Skin check.  Lung cancer screening. You may have this screening every year starting at age 12 if you have a 30-pack-year history of smoking and currently smoke or have quit within the past 15 years.  Fecal occult blood test (FOBT) of the stool. You may have this test every year starting at age 77.  Flexible sigmoidoscopy or colonoscopy. You may have  a sigmoidoscopy every 5 years or a colonoscopy every 10 years starting at age  62.  Hepatitis C blood test.  Hepatitis B blood test.  Sexually transmitted disease (STD) testing.  Diabetes screening. This is done by checking your blood sugar (glucose) after you have not eaten for a while (fasting). You may have this done every 1-3 years.  Bone density scan. This is done to screen for osteoporosis. You may have this done starting at age 59.  Mammogram. This may be done every 1-2 years. Talk to your health care provider about how often you should have regular mammograms. Talk with your health care provider about your test results, treatment options, and if necessary, the need for more tests. Vaccines  Your health care provider may recommend certain vaccines, such as:  Influenza vaccine. This is recommended every year.  Tetanus, diphtheria, and acellular pertussis (Tdap, Td) vaccine. You may need a Td booster every 10 years.  Zoster vaccine. You may need this after age 81.  Pneumococcal 13-valent conjugate (PCV13) vaccine. One dose is recommended after age 51.  Pneumococcal polysaccharide (PPSV23) vaccine. One dose is recommended after age 80. Talk to your health care provider about which screenings and vaccines you need and how often you need them. This information is not intended to replace advice given to you by your health care provider. Make sure you discuss any questions you have with your health care provider. Document Released: 12/22/2015 Document Revised: 08/14/2016 Document Reviewed: 09/26/2015 Elsevier Interactive Patient Education  2017 [employer] Prevention in the Home Falls can cause injuries. They can happen to people of all ages. There are many things you can do to make your home safe and to help prevent falls. What can I do on the outside of my home?  Regularly fix the edges of walkways and driveways and fix any cracks.  Remove anything that might make you trip as you walk through a door, such as a raised step or threshold.  Trim  any bushes or trees on the path to your home.  Use bright outdoor lighting.  Clear any walking paths of anything that might make someone trip, such as rocks or tools.  Regularly check to see if handrails are loose or broken. Make sure that both sides of any steps have handrails.  Any raised decks and porches should have guardrails on the edges.  Have any leaves, snow, or ice cleared regularly.  Use sand or salt on walking paths during winter.  Clean up any spills in your garage right away. This includes oil or grease spills. What can I do in the bathroom?  Use night lights.  Install grab bars by the toilet and in the tub and shower. Do not use towel bars as grab bars.  Use non-skid mats or decals in the tub or shower.  If you need to sit down in the shower, use a plastic, non-slip stool.  Keep the floor dry. Clean up any water that spills on the floor as soon as it happens.  Remove soap buildup in the tub or shower regularly.  Attach bath mats securely with double-sided non-slip rug tape.  Do not have throw rugs and other things on the floor that can make you trip. What can I do in the bedroom?  Use night lights.  Make sure that you have a light by your bed that is easy to reach.  Do not use any sheets or blankets that are too big for your bed.  They should not hang down onto the floor.  Have a firm chair that has side arms. You can use this for support while you get dressed.  Do not have throw rugs and other things on the floor that can make you trip. What can I do in the kitchen?  Clean up any spills right away.  Avoid walking on wet floors.  Keep items that you use a lot in easy-to-reach places.  If you need to reach something above you, use a strong step stool that has a grab bar.  Keep electrical cords out of the way.  Do not use floor polish or wax that makes floors slippery. If you must use wax, use non-skid floor wax.  Do not have throw rugs and other  things on the floor that can make you trip. What can I do with my stairs?  Do not leave any items on the stairs.  Make sure that there are handrails on both sides of the stairs and use them. Fix handrails that are broken or loose. Make sure that handrails are as long as the stairways.  Check any carpeting to make sure that it is firmly attached to the stairs. Fix any carpet that is loose or worn.  Avoid having throw rugs at the top or bottom of the stairs. If you do have throw rugs, attach them to the floor with carpet tape.  Make sure that you have a light switch at the top of the stairs and the bottom of the stairs. If you do not have them, ask someone to add them for you. What else can I do to help prevent falls?  Wear shoes that:  Do not have high heels.  Have rubber bottoms.  Are comfortable and fit you well.  Are closed at the toe. Do not wear sandals.  If you use a stepladder:  Make sure that it is fully opened. Do not climb a closed stepladder.  Make sure that both sides of the stepladder are locked into place.  Ask someone to hold it for you, if possible.  Clearly mark and make sure that you can see:  Any grab bars or handrails.  First and last steps.  Where the edge of each step is.  Use tools that help you move around (mobility aids) if they are needed. These include:  Canes.  Walkers.  Scooters.  Crutches.  Turn on the lights when you go into a dark area. Replace any light bulbs as soon as they burn out.  Set up your furniture so you have a clear path. Avoid moving your furniture around.  If any of your floors are uneven, fix them.  If there are any pets around you, be aware of where they are.  Review your medicines with your doctor. Some medicines can make you feel dizzy. This can increase your chance of falling. Ask your doctor what other things that you can do to help prevent falls. This information is not intended to replace advice given to  you by your health care provider. Make sure you discuss any questions you have with your health care provider. Document Released: 09/21/2009 Document Revised: 05/02/2016 Document Reviewed: 12/30/2014 Elsevier Interactive Patient Education  2017 [employer].

## 2021-03-21 NOTE — Progress Notes (Signed)
Subjective:   Alexandra Wolfe is a 75 y.o. female who presents for Medicare Annual (Subsequent) preventive examination.  Review of Systems    No ROS. Medicare Wellness Visit. Additional risk factors are reflected in social history. Cardiac Risk Factors include: advanced age (>18men, >55 women);diabetes mellitus;dyslipidemia;family history of premature cardiovascular disease;hypertension;obesity (BMI >30kg/m2)     Objective:    Today's Vitals   03/21/21 1524  BP: 120/66  Pulse: 80  Temp: 97.6 F (36.4 C)  SpO2: 97%  Weight: 222 lb (100.7 kg)  Height: 5\' 6"  (1.676 m)   Body mass index is 35.83 kg/m.  Advanced Directives 03/21/2021 03/21/2020 03/14/2020 03/01/2020 02/29/2020 12/19/2015 12/13/2011  Does Patient Have a Medical Advance Directive? Yes Yes No Yes No Yes Patient has advance directive, copy not in chart  Type of Advance Directive [place] (No Data) - Living will - - [place]  Does patient want to make changes to medical advance directive? - No - Patient declined No - Patient declined No - Patient declined - - -  Copy of [place] in Chart? No - copy requested - - - - Yes -  Would patient like information on creating a medical advance directive? - - No - Patient declined No - Patient declined No - Patient declined - -  Pre-existing out of facility DNR order (yellow form or pink MOST form) - - - - - - No    Current Medications (verified) Outpatient Encounter Medications as of 03/21/2021  Medication Sig  . acetaminophen (TYLENOL) 325 MG tablet Take 2 tablets (650 mg total) by mouth every 6 (six) hours as needed for mild pain or headache.  . albuterol (VENTOLIN HFA) 108 (90 Base) MCG/ACT inhaler INHALE 2 PUFFS INTO THE LUNGS EVERY 6 HOURS AS NEEDED FOR WHEEZING OR SHORTNESS OF BREATH  . atorvastatin (LIPITOR) 80 MG tablet TAKE 1 TABLET(80 MG) BY MOUTH DAILY  . budesonide-formoterol (SYMBICORT) 160-4.5 MCG/ACT inhaler  Inhale 2 puffs into the lungs 2 (two) times daily.  . Calcium Carbonate (CALCIUM 500 PO) Take 1,000 mg by mouth 2 (two) times daily.   . cetirizine (ZYRTEC) 10 MG tablet Take 1 tablet (10 mg total) by mouth daily.  . diclofenac (VOLTAREN) 75 MG EC tablet TAKE 1 TABLET(75 MG) BY MOUTH TWICE DAILY  . Fluoxetine HCl, PMDD, 20 MG TABS   . fluticasone (FLONASE) 50 MCG/ACT nasal spray SHAKE LIQUID AND USE 2 SPRAYS IN EACH NOSTRIL DAILY  . levETIRAcetam (KEPPRA) 500 MG tablet Take 1 tablet (500 mg total) by mouth 2 (two) times daily. (Patient not taking: Reported on 03/21/2021)  . levothyroxine (SYNTHROID) 50 MCG tablet TAKE 1 TABLET(50 MCG) BY MOUTH DAILY  . meclizine (ANTIVERT) 25 MG tablet Take 1 tablet (25 mg total) by mouth 3 (three) times daily as needed for dizziness.  . metFORMIN (GLUMETZA) 500 MG (MOD) 24 hr tablet Take 1 tablet (500 mg total) by mouth 2 (two) times daily with a meal.  . metoprolol succinate (TOPROL-XL) 25 MG 24 hr tablet TAKE 1 TABLET(25 MG) BY MOUTH DAILY  . Multiple Vitamin (MULTIVITAMIN WITH MINERALS) TABS tablet Take 1 tablet by mouth daily.  [name] MYRBETRIQ 50 MG TB24 tablet Take 1 tablet (50 mg total) by mouth at bedtime.  . NON FORMULARY Heart healthy lcs diet  . NP THYROID 30 MG tablet Take 1 tablet (30 mg total) by mouth daily.  . ondansetron (ZOFRAN) 4 MG tablet Take 1 tablet (4 mg  total) by mouth every 8 (eight) hours as needed for nausea or vomiting.  . phentermine 30 MG capsule Take 1 capsule (30 mg total) by mouth every morning.  . senna-docusate (SENOKOT-S) 8.6-50 MG tablet Take 1 tablet by mouth at bedtime as needed for mild constipation.  . traZODone (DESYREL) 50 MG tablet TAKE 1 TABLET(50 MG) BY MOUTH AT BEDTIME   No facility-administered encounter medications on file as of 03/21/2021.    Allergies (verified) Aripiprazole and Simvastatin   History: Past Medical History:  Diagnosis Date  . ALLERGIC RHINITIS 04/02/2009   no per pt  . Anxiety   . BACK PAIN  09/27/2008  . BUNIONS, BILATERAL 12/23/2007  . CHEST DISCOMFORT, ATYPICAL 11/07/2009  . Chronic LBP   . COLONIC POLYPS, HX OF 09/13/2007  . CONSTIPATION 09/27/2008  . DEPRESSION 09/13/2007  . Diabetes mellitus    diet controlled  . DYSPNEA ON EXERTION 02/06/2010  . Eustachian tube dysfunction 05/20/2011  . FOOT PAIN, RIGHT 09/27/2008  . HYPERLIPIDEMIA 03/11/2008  . LBP (low back pain) 05/20/2011  . Leukocytosis 11/20/2011  . OSTEOARTHROSIS NOS, LOWER LEG 09/13/2007  . SHOULDER PAIN, LEFT 02/14/2009  . SVT (supraventricular tachycardia) ([place])   . SYMPTOM, PALPITATIONS 09/13/2007  . URINARY INCONTINENCE 08/23/2009  . Vertigo 05/20/2011   Past Surgical History:  Procedure Laterality Date  . BUNIONECTOMY     right  . ccx    . CHOLECYSTECTOMY    . LUMBAR LAMINECTOMY    . LUMBAR LAMINECTOMY    . SHOULDER ARTHROSCOPY  12/13/2011   Procedure: ARTHROSCOPY SHOULDER;  Surgeon: Augustin Schooling;  Location: [facility];  Service: Orthopedics;  Laterality: Left;  Left Shoulder ArthroscopyDebridement Limited Tenodesis Open Rotator Cuff Repair Spur Removal Right Shoulder Injection    Family History  Problem Relation Age of Onset  . Colon cancer Mother   . Heart disease Father   . Diabetes type II Father   . Heart disease Brother   . Diabetes Other        father  . Esophageal cancer Neg Hx   . Stomach cancer Neg Hx   . Rectal cancer Neg Hx    Social History   Socioeconomic History  . Marital status: Married    Spouse name: Not on file  . Number of children: 2  . Years of education: Not on file  . Highest education level: Not on file  Occupational History  . Occupation: Disabled Psych.  Tobacco Use  . Smoking status: Former Smoker    Packs/day: 1.00    Years: 10.00    Pack years: 10.00  . Smokeless tobacco: Never Used  . Tobacco comment: Smoked as a teenager. Quit in 1970  Substance and Sexual Activity  . Alcohol use: No    Alcohol/week: 0.0 standard drinks  . Drug use: No  . Sexual activity:  Never  Other Topics Concern  . Not on file  Social History Narrative   Husband chronically ill in nursing home.      Disabled - psychiatric   Social Determinants of Health   Financial Resource Strain: Low Risk   . Difficulty of Paying Living Expenses: Not hard at all  Food Insecurity: No Food Insecurity  . Worried About Charity fundraiser in the Last Year: Never true  . Ran Out of Food in the Last Year: Never true  Transportation Needs: No Transportation Needs  . Lack of Transportation (Medical): No  . Lack of Transportation (Non-Medical): No  Physical Activity: Sufficiently Active  .  Days of Exercise per Week: 5 days  . Minutes of Exercise per Session: 30 min  Stress: No Stress Concern Present  . Feeling of Stress : Not at all  Social Connections: Socially Isolated  . Frequency of Communication with Friends and Family: More than three times a week  . Frequency of Social Gatherings with Friends and Family: Once a week  . Attends Religious Services: Never  . Active Member of Clubs or Organizations: No  . Attends Archivist Meetings: Never  . Marital Status: Widowed    Tobacco Counseling Counseling given: Not Answered Comment: Smoked as a teenager. Quit in 1970   Clinical Intake:  Pre-visit preparation completed: Yes  Pain : No/denies pain     BMI - recorded: 35.83 Nutritional Status: BMI > 30  Obese Nutritional Risks: None Diabetes: Yes CBG done?: No Did pt. bring in CBG monitor from home?: No  How often do you need to have someone help you when you read instructions, pamphlets, or other written materials from your doctor or pharmacy?: 1 - Never What is the last grade level you completed in school?: High School Graduate  Diabetic? yes  Interpreter Needed?: No  Information entered by :: Lisette Abu, LPN   Activities of Daily Living In your present state of health, do you have any difficulty performing the following activities: 03/21/2021  03/21/2021  Hearing? N N  Vision? N N  Difficulty concentrating or making decisions? Y N  Walking or climbing stairs? Y Y  Dressing or bathing? Y Y  Doing errands, shopping? N N  Preparing Food and eating ? N -  Using the Toilet? N -  In the past six months, have you accidently leaked urine? Y -  Comment wears Adult Depends -  Do you have problems with loss of bowel control? N -  Managing your Medications? N -  Managing your Finances? Y -  Housekeeping or managing your Housekeeping? Y -  Some recent data might be hidden    Patient Care Team: Biagio Borg, MD as PCP - General  Indicate any recent Medical Services you may have received from other than [facility] providers in the past year (date may be approximate).     Assessment:   This is a routine wellness examination for [place].  Hearing/Vision screen No exam data present  Dietary issues and exercise activities discussed: Current Exercise Habits: Home exercise routine, Type of exercise: walking, Time (Minutes): 30, Frequency (Times/Week): 5, Weekly Exercise (Minutes/Week): 150, Intensity: Mild, Exercise limited by: psychological condition(s);respiratory conditions(s);cardiac condition(s)  Goals    .  fup depressed mood (pt-stated)      The patient discussed recent losses and admitted current environment is not in a particularly safe area; The patient set a goal of reaching out to church for assistance with move to a safer neighborhood;  States she has asked for help from the church at an earlier time and has high level of confidence that will receive help.   2nd goal is to make apt with dr. Jenny Reichmann (scheduled Friday at 2:30) for discussion and fup for depression;   3rd. Will outreach [employer] health if she feels she needs to process grief.     .  Patient Stated (pt-stated)      Continue to loss weight.  I would like to lose 10 more pounds.      Depression Screen PHQ 2/9 Scores 03/21/2021 07/05/2020 04/04/2020  07/01/2019 07/01/2019 03/04/2018 03/04/2017  PHQ - 2 Score 0 1  0 0 0 0 0  PHQ- 9 Score - - - - 0 - -    Fall Risk Fall Risk  03/21/2021 01/12/2021 07/01/2019 03/04/2018 03/04/2017  Falls in the past year? 1 0 0 No Yes  Comment - - - - -  Number falls in past yr: 1 1 - - 1  Injury with Fall? 1 1 - - No  Risk for fall due to : History of fall(s);Mental status change - - - -  Follow up Falls evaluation completed - - - -    FALL RISK PREVENTION PERTAINING TO THE HOME:  Any stairs in or around the home? No  If so, are there any without handrails? No  Home free of loose throw rugs in walkways, pet beds, electrical cords, etc? Yes  Adequate lighting in your home to reduce risk of falls? Yes   ASSISTIVE DEVICES UTILIZED TO PREVENT FALLS:  Life alert? Yes  Use of a cane, walker or w/c? Yes  Grab bars in the bathroom? Yes  Shower chair or bench in shower? Yes  Elevated toilet seat or a handicapped toilet? Yes   TIMED UP AND GO:  Was the test performed? No .  Length of time to ambulate 10 feet: 0 sec.   Gait steady and fast without use of assistive device  Cognitive Function: MMSE - Mini Mental State Exam 03/21/2021 12/19/2015  Not completed: Unable to complete Unable to complete        Immunizations Immunization History  Administered Date(s) Administered  . H1N1 11/17/2008  . Influenza Whole 09/27/2008, 10/04/2009, 08/14/2010  . Influenza, High Dose Seasonal PF 09/03/2017, 09/04/2018, 01/07/2020  . Influenza, Seasonal, Injecte, Preservative Fre 11/09/2012  . Influenza,inj,Quad PF,6+ Mos 11/24/2013, 12/22/2015, 08/22/2016  . Moderna Sars-Covid-2 Vaccination 03/15/2020, 04/02/2020  . Pneumococcal Conjugate-13 03/01/2015  . Pneumococcal Polysaccharide-23 09/27/2008, 11/09/2012  . Tdap 11/09/2012    TDAP status: Up to date  Flu Vaccine status: Due, Education has been provided regarding the importance of this vaccine. Advised may receive this vaccine at local pharmacy or Health Dept.  Aware to provide a copy of the vaccination record if obtained from local pharmacy or Health Dept. Verbalized acceptance and understanding.  Pneumococcal vaccine status: Up to date  Covid-19 vaccine status: Completed vaccines  Qualifies for Shingles Vaccine? Yes   Zostavax completed No   Shingrix Completed?: No.    Education has been provided regarding the importance of this vaccine. Patient has been advised to call insurance company to determine out of pocket expense if they have not yet received this vaccine. Advised may also receive vaccine at local pharmacy or Health Dept. Verbalized acceptance and understanding.  Screening Tests Health Maintenance  Topic Date Due  . URINE MICROALBUMIN  06/30/2020  . FOOT EXAM  07/05/2021  . INFLUENZA VACCINE  07/09/2021  . HEMOGLOBIN A1C  07/12/2021  . OPHTHALMOLOGY EXAM  12/29/2021  . COLONOSCOPY (Pts 45-87yrs Insurance coverage will need to be confirmed)  03/20/2022  . TETANUS/TDAP  11/09/2022  . DEXA SCAN  Completed  . Hepatitis C Screening  Completed  . PNA vac Low Risk Adult  Completed  . HPV VACCINES  Aged Out  . COVID-19 Vaccine  Discontinued    Health Maintenance  Health Maintenance Due  Topic Date Due  . URINE MICROALBUMIN  06/30/2020    Colorectal cancer screening: Type of screening: Colonoscopy. Completed 03/20/2012. Repeat every 10 years  Mammogram status: Completed 07/20/2020. Repeat every year  Bone Density status: Completed 04/16/2016. Results reflect: Bone  density results: NORMAL. Repeat every 0 years. (completed, no longer recommended)  Lung Cancer Screening: (Low Dose CT Chest recommended if Age 21-80 years, 30 pack-year currently smoking OR have quit w/in 15years.) does not qualify.   Lung Cancer Screening Referral: no  Additional Screening:  Hepatitis C Screening: does qualify; Completed yes  Vision Screening: Recommended annual ophthalmology exams for early detection of glaucoma and other disorders of the eye. Is  the patient up to date with their annual eye exam?  Yes  Who is the provider or what is the name of the office in which the patient attends annual eye exams? Jola Schmidt, MD. If pt is not established with a provider, would they like to be referred to a provider to establish care? No .   Dental Screening: Recommended annual dental exams for proper oral hygiene  Community Resource Referral / Chronic Care Management: CRR required this visit?  No   CCM required this visit?  No      Plan:     I have personally reviewed and noted the following in the patient's chart:   . Medical and social history . Use of alcohol, tobacco or illicit drugs  . Current medications and supplements . Functional ability and status . Nutritional status . Physical activity . Advanced directives . List of other physicians . Hospitalizations, surgeries, and ER visits in previous 12 months . Vitals . Screenings to include cognitive, depression, and falls . Referrals and appointments  In addition, I have reviewed and discussed with patient certain preventive protocols, quality metrics, and best practice recommendations. A written personalized care plan for preventive services as well as general preventive health recommendations were provided to patient.     Sheral Flow, LPN   [id number]   Nurse Notes:  Medications reviewed with patient; no opioid use noted.

## 2021-03-24 ENCOUNTER — Encounter: Payer: Self-pay | Admitting: Internal Medicine

## 2021-03-24 NOTE — Assessment & Plan Note (Signed)
Pt encouraged to f/u with ortho after further wt loss to consider eligibility for left knee TKR, as the phentermine cannot be indefinite and before any wt regain

## 2021-03-24 NOTE — Assessment & Plan Note (Signed)
Lab Results  Component Value Date   HGBA1C 6.5 (H) 01/12/2021   Stable, pt to continue current medical treatment - metformiin and ok to d/c jardiance as she plans to continue to lose wt as well

## 2021-03-24 NOTE — Assessment & Plan Note (Addendum)
C/w eczema flare  - for depomedrom IM 80, topical steroid prn,  to f/u any worsening symptoms or concerns

## 2021-03-24 NOTE — Assessment & Plan Note (Signed)
Lab Results  Component Value Date   LDLCALC 72 01/12/2021   Stable, pt to continue current statin lipitor 80

## 2021-03-24 NOTE — Assessment & Plan Note (Signed)
Improving with phentermine and tolerating well, cont med as is

## 2021-03-26 ENCOUNTER — Other Ambulatory Visit: Payer: Self-pay | Admitting: Podiatry

## 2021-03-26 ENCOUNTER — Other Ambulatory Visit: Payer: Self-pay

## 2021-03-26 ENCOUNTER — Ambulatory Visit (INDEPENDENT_AMBULATORY_CARE_PROVIDER_SITE_OTHER): Payer: Medicare Other

## 2021-03-26 ENCOUNTER — Ambulatory Visit: Payer: Medicare Other | Admitting: Podiatry

## 2021-03-26 DIAGNOSIS — M7752 Other enthesopathy of left foot: Secondary | ICD-10-CM

## 2021-03-26 DIAGNOSIS — M778 Other enthesopathies, not elsewhere classified: Secondary | ICD-10-CM

## 2021-03-26 DIAGNOSIS — M19079 Primary osteoarthritis, unspecified ankle and foot: Secondary | ICD-10-CM

## 2021-03-26 DIAGNOSIS — M79672 Pain in left foot: Secondary | ICD-10-CM

## 2021-03-26 DIAGNOSIS — M79671 Pain in right foot: Secondary | ICD-10-CM

## 2021-03-26 DIAGNOSIS — M7751 Other enthesopathy of right foot: Secondary | ICD-10-CM

## 2021-03-26 MED ORDER — MELOXICAM 15 MG PO TABS: 15.0000 mg | ORAL_TABLET | Freq: Every day | ORAL | 1 refills | Status: DC

## 2021-03-26 NOTE — Patient Instructions (Signed)
Voltaren (diclofenac) 1% gel available at any pharmacy

## 2021-03-26 NOTE — Progress Notes (Signed)
   HPI: 75 y.o. female presenting today as a new patient for generalized foot pain bilateral.  Patient states her feet hurt all over.  This has been ongoing for several years now.  She denies history of injury.  She takes Tylenol for the pain.  Past Medical History:  Diagnosis Date  . ALLERGIC RHINITIS 04/02/2009   no per pt  . Anxiety   . BACK PAIN 09/27/2008  . BUNIONS, BILATERAL 12/23/2007  . CHEST DISCOMFORT, ATYPICAL 11/07/2009  . Chronic LBP   . COLONIC POLYPS, HX OF 09/13/2007  . CONSTIPATION 09/27/2008  . DEPRESSION 09/13/2007  . Diabetes mellitus    diet controlled  . DYSPNEA ON EXERTION 02/06/2010  . Eustachian tube dysfunction 05/20/2011  . FOOT PAIN, RIGHT 09/27/2008  . HYPERLIPIDEMIA 03/11/2008  . LBP (low back pain) 05/20/2011  . Leukocytosis 11/20/2011  . OSTEOARTHROSIS NOS, LOWER LEG 09/13/2007  . SHOULDER PAIN, LEFT 02/14/2009  . SVT (supraventricular tachycardia) ([place])   . SYMPTOM, PALPITATIONS 09/13/2007  . URINARY INCONTINENCE 08/23/2009  . Vertigo 05/20/2011     Physical Exam: General: The patient is alert and oriented x3 in no acute distress.  Dermatology: Skin is warm, dry and supple bilateral lower extremities. Negative for open lesions or macerations.  Vascular: Palpable pedal pulses bilaterally. No edema or erythema noted. Capillary refill within normal limits.  Neurological: Epicritic and protective threshold grossly intact bilaterally.   Musculoskeletal Exam: Range of motion within normal limits to all pedal and ankle joints bilateral. Muscle strength 5/5 in all groups bilateral.   Radiographic Exam:  Normal osseous mineralization. Joint spaces preserved. No fracture/dislocation/boney destruction.    Assessment: 1.  Generalized DJD/capsulitis bilateral foot   Plan of Care:  1. Patient evaluated. X-Rays reviewed.  2.  Continue wearing good supportive Vionic shoes 3.  Prescription for meloxicam 15 mg daily as needed 4.  Recommend OTC Voltaren topical gel  as needed 5.  Return to clinic as needed     Edrick Kins, DPM [facility]  Dr. Edrick Kins, DPM    [address],  [address]                Office [phone]  Fax [phone]

## 2021-03-28 ENCOUNTER — Telehealth: Payer: Self-pay | Admitting: Internal Medicine

## 2021-03-28 NOTE — Telephone Encounter (Signed)
Patient requesting order for One Touch test strips  Pharmacy [place], [place]

## 2021-03-29 MED ORDER — ONETOUCH ULTRA VI STRP: ORAL_STRIP | 12 refills | Status: DC

## 2021-03-29 NOTE — Telephone Encounter (Signed)
Left voicemail notifying patient that test strips have been sent.

## 2021-03-29 NOTE — Telephone Encounter (Signed)
Done erx 

## 2021-04-11 ENCOUNTER — Other Ambulatory Visit: Payer: Self-pay | Admitting: Internal Medicine

## 2021-04-11 DIAGNOSIS — I10 Essential (primary) hypertension: Secondary | ICD-10-CM

## 2021-04-16 DIAGNOSIS — L218 Other seborrheic dermatitis: Secondary | ICD-10-CM | POA: Diagnosis not present

## 2021-04-16 DIAGNOSIS — L821 Other seborrheic keratosis: Secondary | ICD-10-CM | POA: Diagnosis not present

## 2021-04-16 DIAGNOSIS — D224 Melanocytic nevi of scalp and neck: Secondary | ICD-10-CM | POA: Diagnosis not present

## 2021-04-17 DIAGNOSIS — M170 Bilateral primary osteoarthritis of knee: Secondary | ICD-10-CM | POA: Diagnosis not present

## 2021-04-17 DIAGNOSIS — M179 Osteoarthritis of knee, unspecified: Secondary | ICD-10-CM | POA: Insufficient documentation

## 2021-04-20 ENCOUNTER — Other Ambulatory Visit: Payer: Self-pay | Admitting: Internal Medicine

## 2021-04-20 MED ORDER — METFORMIN HCL 500 MG PO TABS: 500.0000 mg | ORAL_TABLET | Freq: Two times a day (BID) | ORAL | 3 refills | Status: DC

## 2021-04-25 ENCOUNTER — Telehealth: Payer: Self-pay | Admitting: Internal Medicine

## 2021-04-25 DIAGNOSIS — J309 Allergic rhinitis, unspecified: Secondary | ICD-10-CM

## 2021-04-25 MED ORDER — CETIRIZINE HCL 10 MG PO TABS: 10.0000 mg | ORAL_TABLET | Freq: Every day | ORAL | 3 refills | Status: DC

## 2021-04-25 NOTE — Telephone Encounter (Signed)
1.Medication Requested: cetirizine (ZYRTEC) 10 MG tablet    2. Pharmacy (Name, Street, [place]): [place], [place]  3. On Med List: yes   4. Last Visit with PCP: 03-21-21  5. Next visit date with PCP: n/a    Agent: Please be advised that RX refills may take up to 3 business days. We ask that you follow-up with your pharmacy.

## 2021-05-11 ENCOUNTER — Other Ambulatory Visit: Payer: Self-pay | Admitting: Internal Medicine

## 2021-05-11 NOTE — Telephone Encounter (Signed)
Please refill as per office routine med refill policy (all routine meds refilled for 3 mo or monthly per pt preference up to one year from last visit, then month to month grace period for 3 mo, then further med refills will have to be denied)  

## 2021-05-21 ENCOUNTER — Other Ambulatory Visit: Payer: Self-pay | Admitting: Podiatry

## 2021-06-10 ENCOUNTER — Other Ambulatory Visit: Payer: Self-pay | Admitting: Internal Medicine

## 2021-06-10 DIAGNOSIS — F5101 Primary insomnia: Secondary | ICD-10-CM

## 2021-07-10 ENCOUNTER — Other Ambulatory Visit: Payer: Self-pay | Admitting: Internal Medicine

## 2021-07-10 DIAGNOSIS — E1169 Type 2 diabetes mellitus with other specified complication: Secondary | ICD-10-CM

## 2021-07-10 NOTE — Telephone Encounter (Signed)
Please refill as per office routine med refill policy (all routine meds refilled for 3 mo or monthly per pt preference up to one year from last visit, then month to month grace period for 3 mo, then further med refills will have to be denied)  

## 2021-07-17 ENCOUNTER — Other Ambulatory Visit: Payer: Self-pay | Admitting: Internal Medicine

## 2021-07-17 DIAGNOSIS — F5101 Primary insomnia: Secondary | ICD-10-CM

## 2021-07-20 ENCOUNTER — Other Ambulatory Visit: Payer: Self-pay | Admitting: Podiatry

## 2021-07-20 NOTE — Telephone Encounter (Signed)
Please advise 

## 2021-07-23 ENCOUNTER — Telehealth: Payer: Self-pay | Admitting: Internal Medicine

## 2021-07-23 NOTE — Telephone Encounter (Signed)
Patient called in about her prescription traZODone HCl 50 MG originally prescribed by Deneise Lever, MD  Patient has not seen provider in a few years bc she said she no longer needs to   Patient is requesting that Dr. Jenny Reichmann send in new rx to pharmacy for this medication since he is now her PCP  Pharmacy: [facility] [place], [place] [address] [place]  Phone:  [phone] Fax:  [phone]  Callback # [phone]

## 2021-07-25 NOTE — Telephone Encounter (Signed)
Pt notified that since PCP is out of office until Aug 22 & PCP has not prescribed this medication for her before, she will have to wait for his return.  Pt verb understanding.

## 2021-07-31 DIAGNOSIS — M13861 Other specified arthritis, right knee: Secondary | ICD-10-CM | POA: Diagnosis not present

## 2021-07-31 DIAGNOSIS — M13862 Other specified arthritis, left knee: Secondary | ICD-10-CM | POA: Diagnosis not present

## 2021-08-09 ENCOUNTER — Other Ambulatory Visit: Payer: Self-pay | Admitting: Internal Medicine

## 2021-08-09 NOTE — Telephone Encounter (Signed)
Please refill as per office routine med refill policy (all routine meds to be refilled for 3 mo or monthly (per pt preference) up to one year from last visit, then month to month grace period for 3 mo, then further med refills will have to be denied) ? ?

## 2021-08-15 DIAGNOSIS — N3946 Mixed incontinence: Secondary | ICD-10-CM | POA: Diagnosis not present

## 2021-09-19 ENCOUNTER — Other Ambulatory Visit: Payer: Self-pay | Admitting: Podiatry

## 2021-09-24 NOTE — Telephone Encounter (Signed)
Patient is callling to request a refill of her Meloxicam -15 mg tablets. Please advise

## 2021-10-29 ENCOUNTER — Other Ambulatory Visit: Payer: Self-pay | Admitting: Internal Medicine

## 2021-10-29 NOTE — Telephone Encounter (Signed)
Please refill as per office routine med refill policy (all routine meds to be refilled for 3 mo or monthly (per pt preference) up to one year from last visit, then month to month grace period for 3 mo, then further med refills will have to be denied) ? ?

## 2021-11-10 ENCOUNTER — Other Ambulatory Visit: Payer: Self-pay

## 2021-11-10 ENCOUNTER — Emergency Department (HOSPITAL_COMMUNITY)
Admission: EM | Admit: 2021-11-10 | Discharge: 2021-11-10 | Disposition: A | Discharge status: Home / Self Care | Payer: Medicare Other | Attending: Emergency Medicine | Admitting: Emergency Medicine

## 2021-11-10 ENCOUNTER — Encounter (HOSPITAL_COMMUNITY): Payer: Self-pay

## 2021-11-10 ENCOUNTER — Emergency Department (HOSPITAL_COMMUNITY): Payer: Medicare Other

## 2021-11-10 DIAGNOSIS — Z87891 Personal history of nicotine dependence: Secondary | ICD-10-CM | POA: Insufficient documentation

## 2021-11-10 DIAGNOSIS — Z7951 Long term (current) use of inhaled steroids: Secondary | ICD-10-CM | POA: Insufficient documentation

## 2021-11-10 DIAGNOSIS — D72829 Elevated white blood cell count, unspecified: Secondary | ICD-10-CM | POA: Diagnosis not present

## 2021-11-10 DIAGNOSIS — R0602 Shortness of breath: Secondary | ICD-10-CM | POA: Diagnosis not present

## 2021-11-10 DIAGNOSIS — I10 Essential (primary) hypertension: Secondary | ICD-10-CM | POA: Diagnosis not present

## 2021-11-10 DIAGNOSIS — R0789 Other chest pain: Secondary | ICD-10-CM | POA: Insufficient documentation

## 2021-11-10 DIAGNOSIS — E1140 Type 2 diabetes mellitus with diabetic neuropathy, unspecified: Secondary | ICD-10-CM | POA: Diagnosis not present

## 2021-11-10 DIAGNOSIS — M47814 Spondylosis without myelopathy or radiculopathy, thoracic region: Secondary | ICD-10-CM | POA: Diagnosis not present

## 2021-11-10 DIAGNOSIS — R059 Cough, unspecified: Secondary | ICD-10-CM | POA: Diagnosis not present

## 2021-11-10 DIAGNOSIS — Z79899 Other long term (current) drug therapy: Secondary | ICD-10-CM | POA: Diagnosis not present

## 2021-11-10 DIAGNOSIS — Z7984 Long term (current) use of oral hypoglycemic drugs: Secondary | ICD-10-CM | POA: Insufficient documentation

## 2021-11-10 DIAGNOSIS — R079 Chest pain, unspecified: Secondary | ICD-10-CM | POA: Diagnosis not present

## 2021-11-10 DIAGNOSIS — E039 Hypothyroidism, unspecified: Secondary | ICD-10-CM | POA: Diagnosis not present

## 2021-11-10 DIAGNOSIS — Z20822 Contact with and (suspected) exposure to covid-19: Secondary | ICD-10-CM | POA: Insufficient documentation

## 2021-11-10 DIAGNOSIS — Z7982 Long term (current) use of aspirin: Secondary | ICD-10-CM | POA: Insufficient documentation

## 2021-11-10 DIAGNOSIS — I700 Atherosclerosis of aorta: Secondary | ICD-10-CM | POA: Diagnosis not present

## 2021-11-10 LAB — RESP PANEL BY RT-PCR (FLU A&B, COVID) ARPGX2
Influenza A by PCR: NEGATIVE
Influenza B by PCR: NEGATIVE
SARS Coronavirus 2 by RT PCR: NEGATIVE

## 2021-11-10 LAB — CBC
HCT: 41.7 % (ref 36.0–46.0)
Hemoglobin: 13.7 g/dL (ref 12.0–15.0)
MCH: 30.6 pg (ref 26.0–34.0)
MCHC: 32.9 g/dL (ref 30.0–36.0)
MCV: 93.1 fL (ref 80.0–100.0)
Platelets: 395 10*3/uL (ref 150–400)
RBC: 4.48 MIL/uL (ref 3.87–5.11)
RDW: 13.2 % (ref 11.5–15.5)
WBC: 17.1 10*3/uL — ABNORMAL HIGH (ref 4.0–10.5)
nRBC: 0 % (ref 0.0–0.2)

## 2021-11-10 LAB — BASIC METABOLIC PANEL
Anion gap: 9 (ref 5–15)
BUN: 21 mg/dL (ref 8–23)
CO2: 22 mmol/L (ref 22–32)
Calcium: 10.9 mg/dL — ABNORMAL HIGH (ref 8.9–10.3)
Chloride: 104 mmol/L (ref 98–111)
Creatinine, Ser: 0.98 mg/dL (ref 0.44–1.00)
GFR, Estimated: 60 mL/min — ABNORMAL LOW (ref >60)
Glucose, Bld: 91 mg/dL (ref 70–99)
Potassium: 4.7 mmol/L (ref 3.5–5.1)
Sodium: 135 mmol/L (ref 135–145)

## 2021-11-10 LAB — TROPONIN I (HIGH SENSITIVITY)
Troponin I (High Sensitivity): 8 ng/L (ref <18)
Troponin I (High Sensitivity): 4 ng/L (ref <18)

## 2021-11-10 LAB — D-DIMER, QUANTITATIVE: D-Dimer, Quant: 0.83 ug/mL-FEU — ABNORMAL HIGH (ref 0.00–0.50)

## 2021-11-10 IMAGING — CR DG CHEST 2V
2 series · 2 of 2 positions shown · non-contrast
Comparison: [DATE]

CLINICAL DATA: Chest pain and shortness of breath for several
weeks. Diabetes.

EXAM:
CHEST - 2 VIEW

[chest lat]
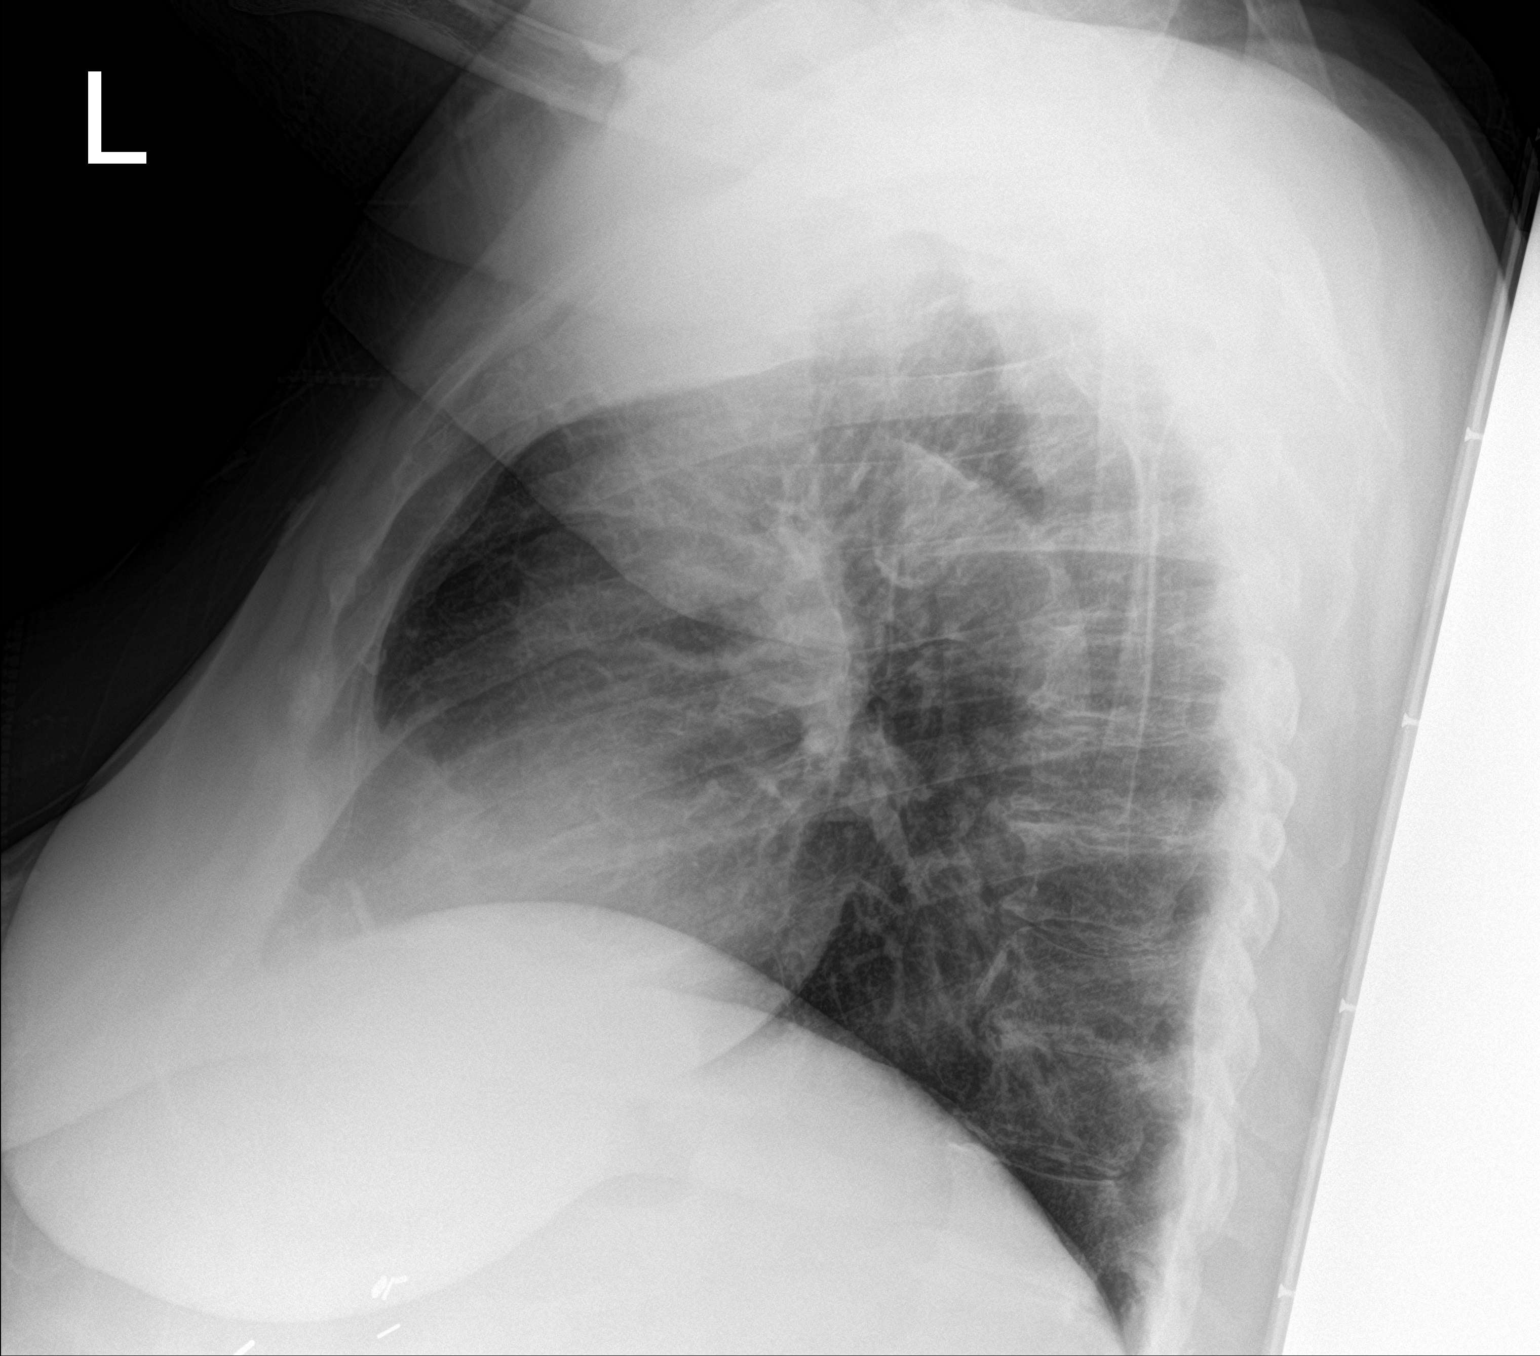

[chest ap]
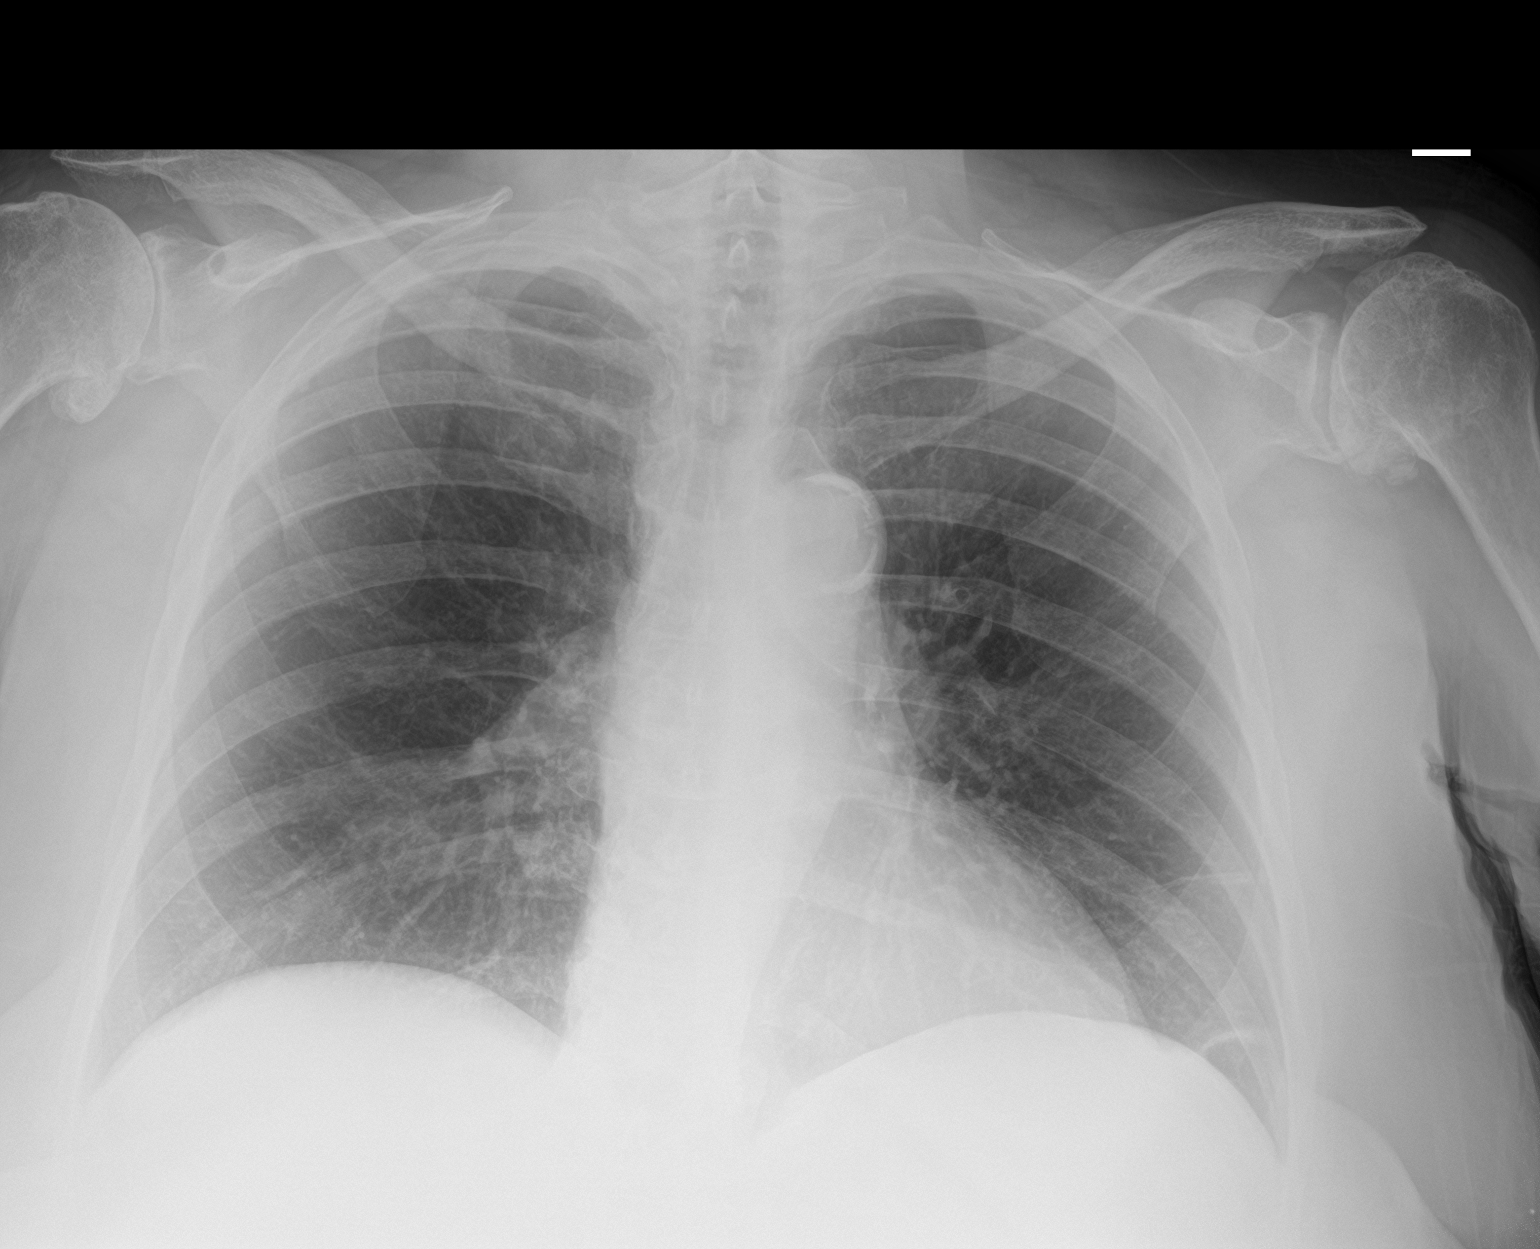

[2 of 2 positions shown; findings below may reference images not displayed]

FINDINGS: Atherosclerotic calcification of the aortic arch. Scarring or
subsegmental atelectasis at the left lung base. Lungs appear
otherwise clear. Heart size within normal limits.

Substantial bilateral degenerative glenohumeral arthropathy. Lower
cervical spondylosis. Moderate thoracic spondylosis.

No blunting of the costophrenic angles.
IMPRESSION: 1. No acute findings to explain the patient's symptoms.
2. Subsegmental atelectasis or scarring at the left lung base.
3.  Aortic Atherosclerosis ([EB]-[EB]).
4. Degenerative glenohumeral arthropathy bilaterally.
5. Spondylosis.

## 2021-11-10 IMAGING — CT CT ANGIO CHEST
2 of 6 series · 18 of 36 positions shown · IV contrast (omnipaque)
Comparison: None.

CLINICAL DATA: Cough and shortness of breath

EXAM:
CT ANGIOGRAPHY CHEST WITH CONTRAST
TECHNIQUE: Multidetector CT imaging of the chest was performed using the
standard protocol during bolus administration of intravenous
contrast. Multiplanar CT image reconstructions and MIPs were
obtained to evaluate the vascular anatomy.
CONTRAST:  80mL OMNIPAQUE IOHEXOL 350 MG/ML SOLN

[Series 7: pe thins · axial · 0.98mm/px · z∈[+1114,+1328]mm · 17 of 342 slices shown]
[im 18/342  lung]
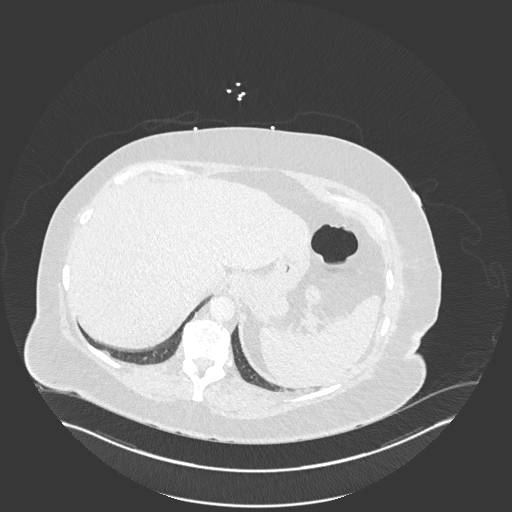
[im 35/342  mediastinal]
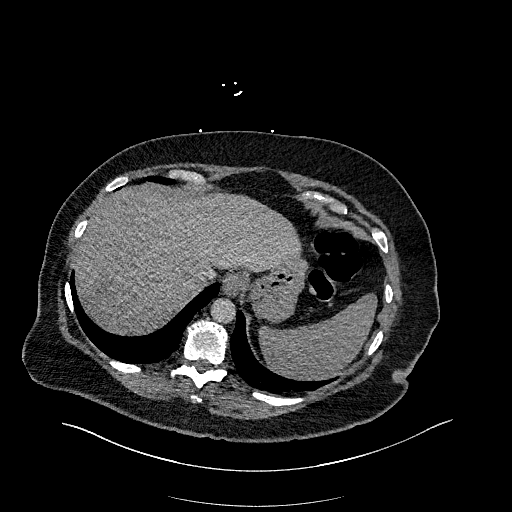
[im 52/342  lung]
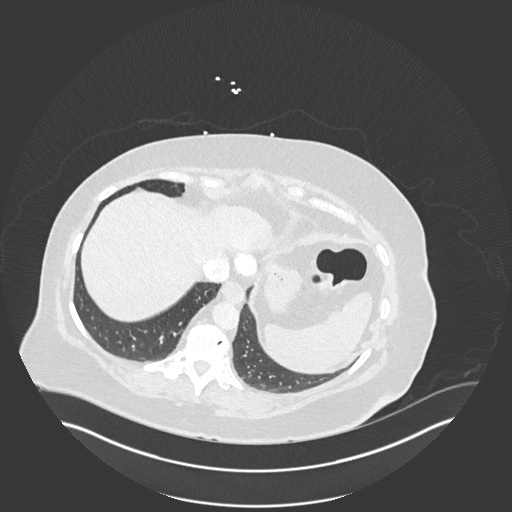
[im 69/342  mediastinal]
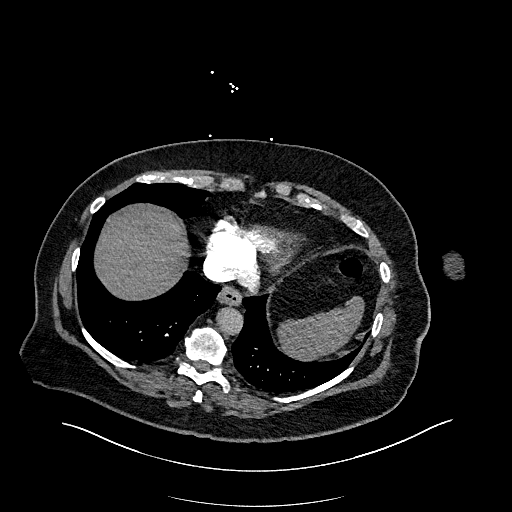
[im 103/342  lung]
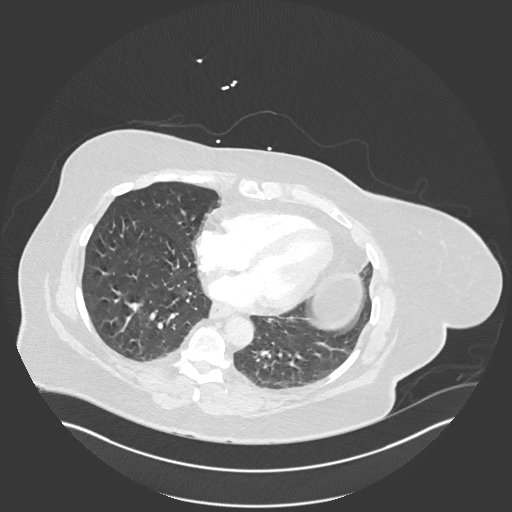
[im 120/342  mediastinal]
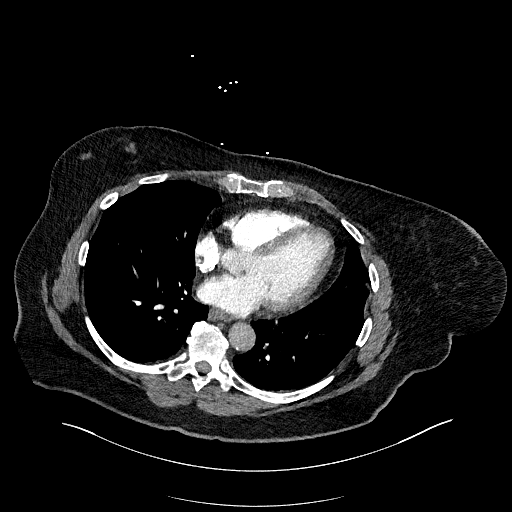
[im 137/342  lung]
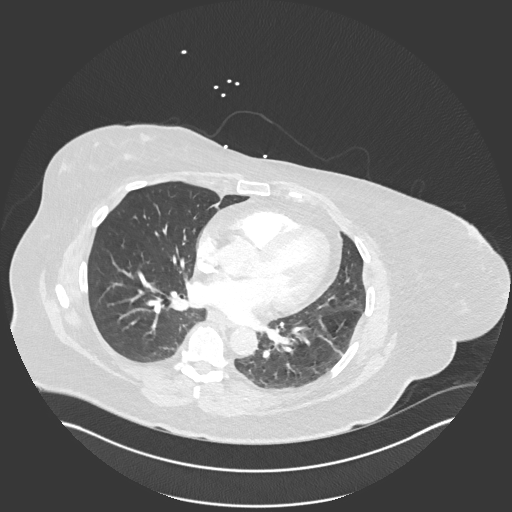
[im 154/342  mediastinal]
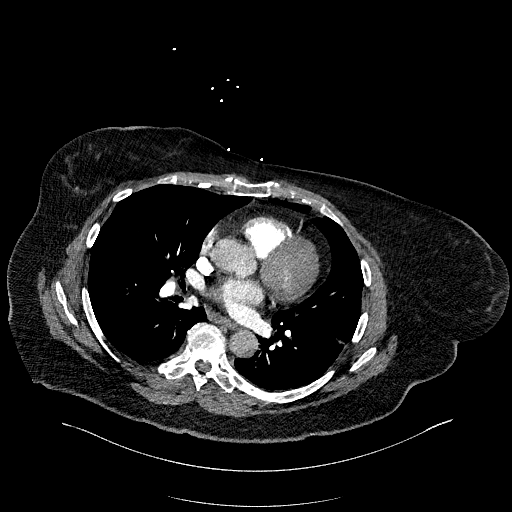
[im 171/342  lung]
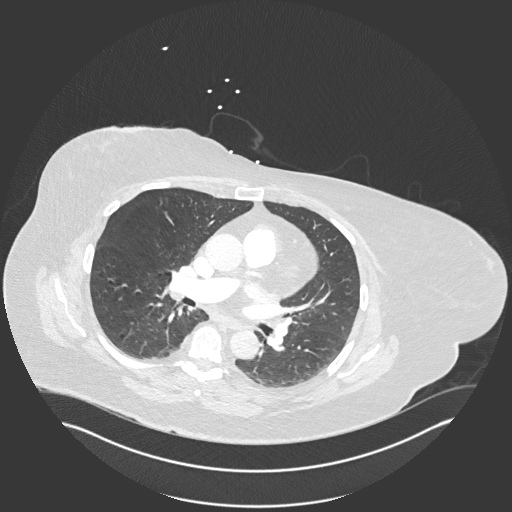
[im 188/342  mediastinal]
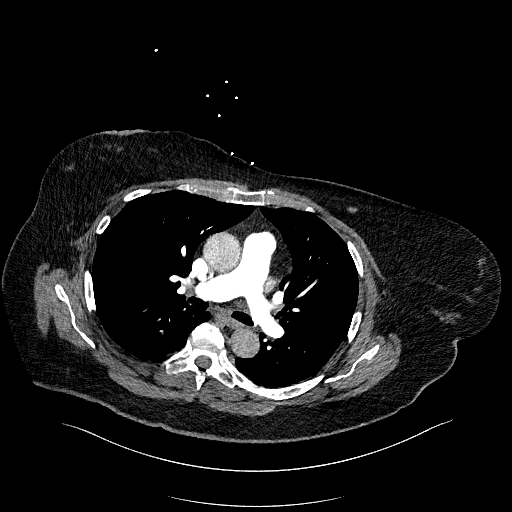
[im 205/342  lung]
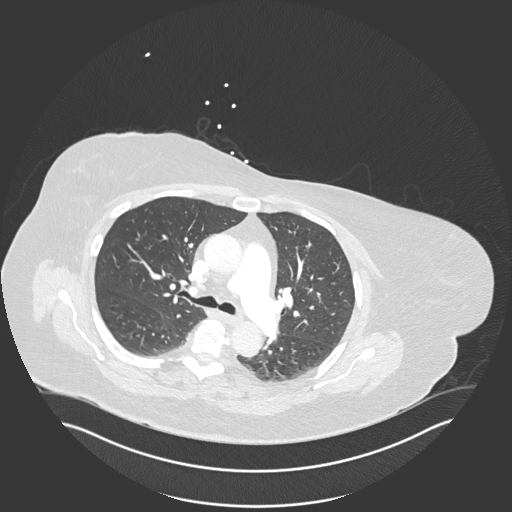
[im 222/342  mediastinal]
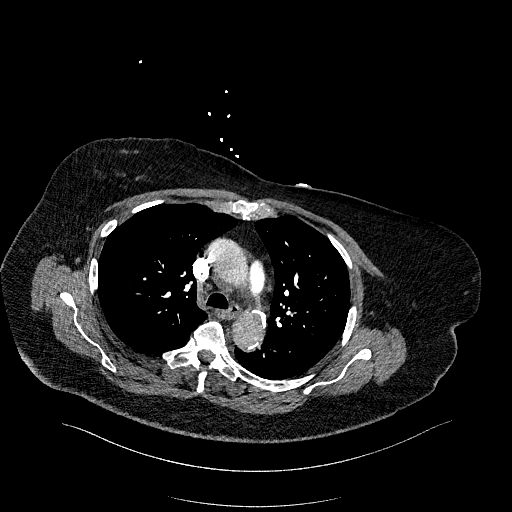
[im 239/342  lung]
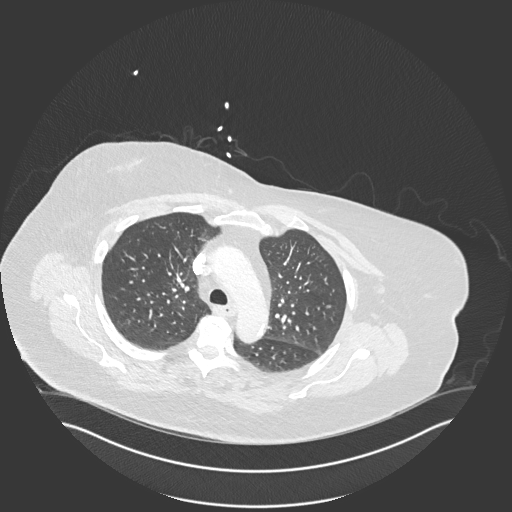
[im 273/342  mediastinal]
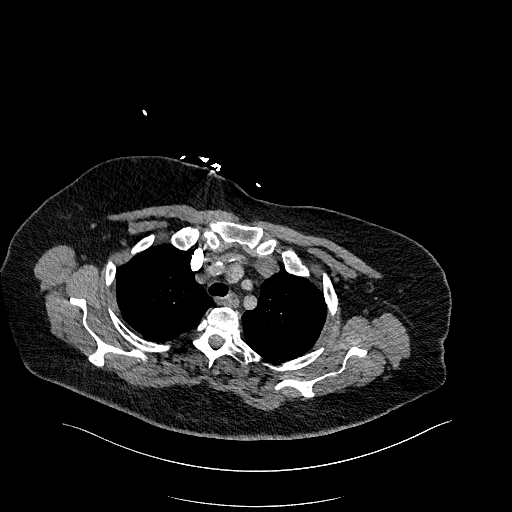
[im 290/342  lung]
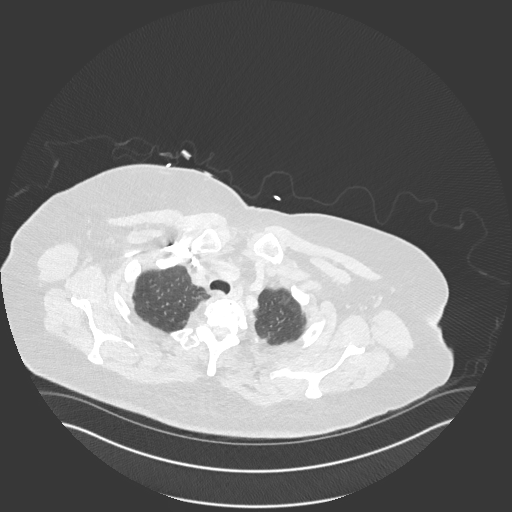
[im 307/342  mediastinal]
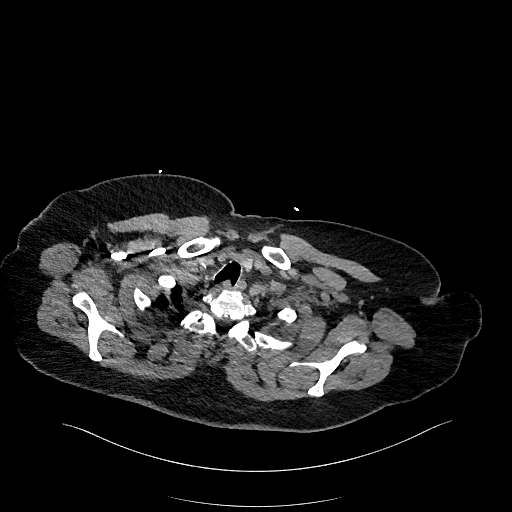
[im 324/342  lung]
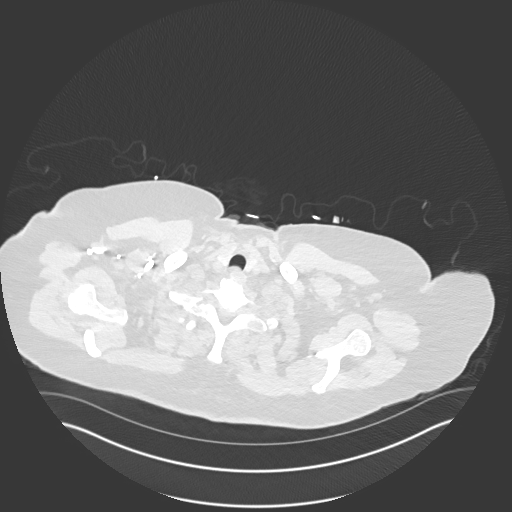

[Series 9: pe 2mm cor · coronal · 0.51mm/px · 1 of 166 slices shown]
[im 83/166  mediastinal]
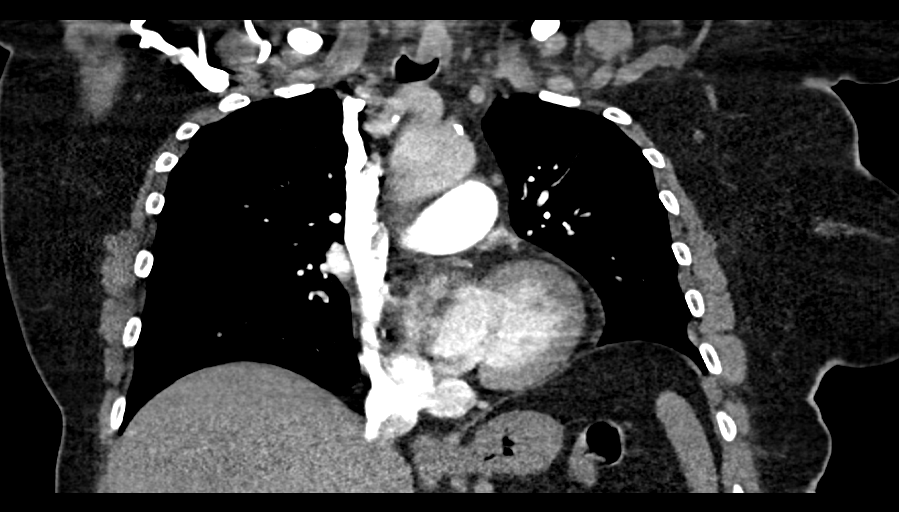

[18 of 36 positions shown; findings below may reference images not displayed]

FINDINGS: Cardiovascular: Contrast injection is sufficient to demonstrate
satisfactory opacification of the pulmonary arteries to the
segmental level. There is no pulmonary embolus or evidence of right
heart strain. The size of the main pulmonary artery is normal. Heart
size is normal, with no pericardial effusion. The course and caliber
of the aorta are normal. There is atherosclerotic calcification. No
acute aortic syndrome.

Mediastinum/Nodes: No mediastinal, hilar or axillary
lymphadenopathy. Normal visualized thyroid. Thoracic esophageal
course is normal.

Lungs/Pleura: Airways are patent. No pleural effusion, lobar
consolidation, pneumothorax or pulmonary infarction.

Upper Abdomen: Contrast bolus timing is not optimized for evaluation
of the abdominal organs. The visualized portions of the organs of
the upper abdomen are normal.

Musculoskeletal: No chest wall abnormality. No bony spinal canal
stenosis.

Review of the MIP images confirms the above findings.
IMPRESSION: 1. No pulmonary embolus or acute aortic syndrome.

Aortic Atherosclerosis ([M4]-[M4]).

## 2021-11-10 MED ORDER — ALUM & MAG HYDROXIDE-SIMETH 200-200-20 MG/5ML PO SUSP
30.0000 mL | Freq: Once | ORAL | Status: AC
  Administered 2021-11-10: 30 mL via ORAL
  Filled 2021-11-10: qty 30

## 2021-11-10 MED ORDER — PANTOPRAZOLE SODIUM 20 MG PO TBEC: 20.0000 mg | DELAYED_RELEASE_TABLET | Freq: Every day | ORAL | 0 refills | Status: DC

## 2021-11-10 MED ORDER — LIDOCAINE VISCOUS HCL 2 % MT SOLN
15.0000 mL | Freq: Once | OROMUCOSAL | Status: AC
  Administered 2021-11-10: 15 mL via ORAL
  Filled 2021-11-10: qty 15

## 2021-11-10 MED ORDER — IOHEXOL 350 MG/ML SOLN
80.0000 mL | Freq: Once | INTRAVENOUS | Status: AC | PRN
  Administered 2021-11-10: 80 mL via INTRAVENOUS

## 2021-11-10 MED ORDER — HYDROXYZINE HCL 25 MG PO TABS
25.0000 mg | ORAL_TABLET | Freq: Once | ORAL | Status: AC
  Administered 2021-11-10: 25 mg via ORAL
  Filled 2021-11-10: qty 1

## 2021-11-10 NOTE — Discharge Instructions (Addendum)
1) please follow-up with your primary care physician for a reassessment. 2) please fill your prescription for pantoprazole and begin taking this daily.  Avoid spicy foods, alcohol, caffeine 3) return to the ED with worsening chest pain, shortness of breath

## 2021-11-10 NOTE — ED Triage Notes (Signed)
Patient complains of cough and SOB that worsened during the night x 2 days. Denies fever. Patient reports that she had chest heaviness with heart burn on arrival

## 2021-11-10 NOTE — ED Provider Notes (Signed)
[facility] EMERGENCY DEPARTMENT Provider Note   CSN: [id number] Arrival date & time: 11/10/21  1317     History No chief complaint on file.   Alexandra Wolfe is a 75 y.o. female.  The history is provided by the patient and medical records.  Shortness of Breath Severity:  Moderate Onset quality:  Gradual Duration:  2 days Timing:  Constant Progression:  Unchanged Relieved by:  Nothing Worsened by:  Nothing Associated symptoms: chest pain   Chest Pain Pain location:  L chest Pain quality: burning   Pain radiates to:  Upper back Pain severity:  Moderate Onset quality:  Gradual Duration:  1 day Timing:  Constant Progression:  Waxing and waning Context: at rest   Worsened by:  Deep breathing Ineffective treatments:  None tried Associated symptoms: shortness of breath       Past Medical History:  Diagnosis Date   ALLERGIC RHINITIS 04/02/2009   no per pt   Anxiety    BACK PAIN 09/27/2008   BUNIONS, BILATERAL 12/23/2007   CHEST DISCOMFORT, ATYPICAL 11/07/2009   Chronic LBP    COLONIC POLYPS, HX OF 09/13/2007   CONSTIPATION 09/27/2008   DEPRESSION 09/13/2007   Diabetes mellitus    diet controlled   DYSPNEA ON EXERTION 02/06/2010   Eustachian tube dysfunction 05/20/2011   FOOT PAIN, RIGHT 09/27/2008   HYPERLIPIDEMIA 03/11/2008   LBP (low back pain) 05/20/2011   Leukocytosis 11/20/2011   OSTEOARTHROSIS NOS, LOWER LEG 09/13/2007   SHOULDER PAIN, LEFT 02/14/2009   SVT (supraventricular tachycardia) (HCC)    SYMPTOM, PALPITATIONS 09/13/2007   URINARY INCONTINENCE 08/23/2009   Vertigo 05/20/2011    Patient Active Problem List   Diagnosis Date Noted   Traumatic brain injury 03/21/2021   Morbid obesity ([place]) 01/13/2021   Recurrent falls 07/05/2020   Body mass index (BMI) 37.0-37.9, adult 04/10/2020   TBI (traumatic brain injury) 04/10/2020   Neurocognitive deficits 03/30/2020   Acute subdural hematoma 02/29/2020   Syncope 02/29/2020   Mild renal  insufficiency 02/29/2020   Essential hypertension 02/29/2020   Asthma 02/29/2020   Chronic dyspnea 01/04/2020   Allergic rhinitis 01/04/2020   External otitis of right ear 07/25/2019   Bilateral foot pain 07/01/2019   Diabetic neuropathy ([place]) 03/04/2018   Obstructive sleep apnea 04/04/2016   Peripheral edema 12/23/2015   Hypersomnolence 12/22/2015   Baker's cyst of knee 11/24/2013   Left knee pain 11/24/2013   Rash 05/26/2012   Bilateral shoulder pain 12/13/2011   Diabetes mellitus type II, non insulin dependent ([place]) 11/20/2011   Leukocytosis 11/20/2011   Preoperative clearance 11/20/2011   Eustachian tube dysfunction 05/20/2011   LBP (low back pain) 05/20/2011   Encounter for well adult exam with abnormal findings 05/20/2011   Vertigo 05/20/2011   Hypothyroidism 02/15/2011   Other dysphagia 02/15/2011   URINARY INCONTINENCE 08/23/2009   Seasonal and perennial allergic rhinitis 04/02/2009   CONSTIPATION 09/27/2008   Hyperlipidemia 03/11/2008   BUNIONS, BILATERAL 12/23/2007   Depression 09/13/2007   Degenerative arthritis of knee, bilateral 09/13/2007   COLONIC POLYPS, HX OF 09/13/2007    Past Surgical History:  Procedure Laterality Date   BUNIONECTOMY     right   ccx     CHOLECYSTECTOMY     LUMBAR LAMINECTOMY     LUMBAR LAMINECTOMY     SHOULDER ARTHROSCOPY  12/13/2011   Procedure: ARTHROSCOPY SHOULDER;  Surgeon: Augustin Schooling;  Location: [facility];  Service: Orthopedics;  Laterality: Left;  Left Shoulder ArthroscopyDebridement Limited Tenodesis Open  Rotator Cuff Repair Spur Removal Right Shoulder Injection      OB History     Gravida  2   Para  2   Term      Preterm      AB      Living  2      SAB      IAB      Ectopic      Multiple      Live Births              Family History  Problem Relation Age of Onset   Colon cancer Mother    Heart disease Father    Diabetes type II Father    Heart disease Brother    Diabetes Other        father    Esophageal cancer Neg Hx    Stomach cancer Neg Hx    Rectal cancer Neg Hx     Social History   Tobacco Use   Smoking status: Former    Packs/day: 1.00    Years: 10.00    Pack years: 10.00    Types: Cigarettes   Smokeless tobacco: Never   Tobacco comments:    Smoked as a teenager. Quit in 1970  Substance Use Topics   Alcohol use: No    Alcohol/week: 0.0 standard drinks   Drug use: No    Home Medications Prior to Admission medications   Medication Sig Start Date End Date Taking? Authorizing Provider  pantoprazole (PROTONIX) 20 MG tablet Take 1 tablet (20 mg total) by mouth daily. 11/10/21 12/10/21 Yes Carmyn Hamm, Burnadette Peter, MD  acetaminophen (TYLENOL) 325 MG tablet Take 2 tablets (650 mg total) by mouth every 6 (six) hours as needed for mild pain or headache. 03/02/20   Geradine Girt, DO  albuterol (VENTOLIN HFA) 108 (90 Base) MCG/ACT inhaler INHALE 2 PUFFS INTO THE LUNGS EVERY 6 HOURS AS NEEDED FOR WHEEZING OR SHORTNESS OF BREATH 09/19/20   Biagio Borg, MD  aspirin 81 MG EC tablet Take by mouth.    [provider]  atorvastatin (LIPITOR) 80 MG tablet TAKE 1 TABLET(80 MG) BY MOUTH DAILY 07/11/21   Biagio Borg, MD  budesonide-formoterol [facility]) 160-4.5 MCG/ACT inhaler Inhale 2 puffs into the lungs 2 (two) times daily. 06/15/20   Biagio Borg, MD  Calcium Carbonate (CALCIUM 500 PO) Take 1,000 mg by mouth 2 (two) times daily.     [provider]  cetirizine (ZYRTEC) 10 MG tablet Take 1 tablet (10 mg total) by mouth daily. 04/25/21 04/25/22  Biagio Borg, MD  diclofenac (VOLTAREN) 75 MG EC tablet TAKE 1 TABLET(75 MG) BY MOUTH TWICE DAILY 03/12/21   Biagio Borg, MD  ergocalciferol (VITAMIN D2) 1.25 MG (50000 UT) capsule Take 1 tablet by mouth daily.    [provider]  fluticasone (FLONASE) 50 MCG/ACT nasal spray SHAKE LIQUID AND USE 2 SPRAYS IN [facility] NOSTRIL DAILY 12/11/20   Biagio Borg, MD  glucose blood [facility] ULTRA) test strip Use as instructed three  times daily E11.9 03/29/21   Biagio Borg, MD  levETIRAcetam (KEPPRA) 500 MG tablet Take 1 tablet (500 mg total) by mouth 2 (two) times daily. Patient not taking: Reported on 03/21/2021 04/06/20   Biagio Borg, MD  levothyroxine (SYNTHROID) 50 MCG tablet TAKE 1 TABLET(50 MCG) BY MOUTH DAILY 10/29/21   Biagio Borg, MD  meclizine (ANTIVERT) 25 MG tablet Take 1 tablet (25 mg total) by mouth 3 (three)  times daily as needed for dizziness. 03/30/20   Medina-Vargas, Monina C, NP  meloxicam (MOBIC) 15 MG tablet TAKE 1 TABLET(15 MG) BY MOUTH DAILY 09/24/21   Edrick Kins, DPM  metFORMIN (GLUCOPHAGE) 500 MG tablet Take 1 tablet (500 mg total) by mouth 2 (two) times daily with a meal. 04/20/21   Biagio Borg, MD  metoprolol succinate (TOPROL-XL) 25 MG 24 hr tablet TAKE 1 TABLET(25 MG) BY MOUTH DAILY 04/11/21   Biagio Borg, MD  Multiple Vitamin (MULTIVITAMIN WITH MINERALS) TABS tablet Take 1 tablet by mouth daily. 03/02/20   Geradine Girt, DO  MYRBETRIQ 50 MG TB24 tablet Take 1 tablet (50 mg total) by mouth at bedtime. 03/30/20   Medina-Vargas, Monina C, NP  NON FORMULARY Heart healthy [facility] diet    [provider]  NP THYROID 30 MG tablet Take 1 tablet (30 mg total) by mouth daily. 03/30/20   Medina-Vargas, Monina C, NP  ondansetron (ZOFRAN) 4 MG tablet Take 1 tablet (4 mg total) by mouth every 8 (eight) hours as needed for nausea or vomiting. 07/06/20   Biagio Borg, MD  oxybutynin (DITROPAN-XL) 10 MG 24 hr tablet Take by mouth.    [provider]  phentermine 30 MG capsule Take 1 capsule (30 mg total) by mouth every morning. 01/12/21   Biagio Borg, MD  predniSONE (DELTASONE) 10 MG tablet 2 tabs by mouth per day for 5 days 03/21/21   Biagio Borg, MD  senna-docusate (SENOKOT-S) 8.6-50 MG tablet Take 1 tablet by mouth at bedtime as needed for mild constipation. 03/16/20   Hosie Poisson, MD  traZODone (DESYREL) 50 MG tablet TAKE 1 TABLET(50 MG) BY MOUTH AT BEDTIME 12/12/20   Young, Tarri Fuller D, MD   triamcinolone cream (KENALOG) 0.1 % Apply 1 application topically 2 (two) times daily. 03/21/21 03/21/22  Biagio Borg, MD    Allergies    Aripiprazole and Simvastatin  Review of Systems   Review of Systems  Respiratory:  Positive for shortness of breath.   Cardiovascular:  Positive for chest pain.   Physical Exam Updated Vital Signs BP 135/64   Pulse 73   Temp 98.3 F (36.8 C) (Oral)   Resp (!) 29   SpO2 95%   Physical Exam Vitals and nursing note reviewed.  Constitutional:      General: She is not in acute distress.    Appearance: Normal appearance. She is well-developed.  HENT:     Head: Normocephalic and atraumatic.     Right Ear: External ear normal.     Left Ear: External ear normal.     Nose: Nose normal. No congestion or rhinorrhea.     Mouth/Throat:     Mouth: Mucous membranes are moist.  Eyes:     Extraocular Movements: Extraocular movements intact.     Conjunctiva/sclera: Conjunctivae normal.     Pupils: Pupils are equal, round, and reactive to light.  Cardiovascular:     Rate and Rhythm: Normal rate and regular rhythm.     Pulses: Normal pulses.     Heart sounds: No murmur heard. Pulmonary:     Effort: No respiratory distress.     Breath sounds: Normal breath sounds. No wheezing, rhonchi or rales.  Abdominal:     General: Abdomen is flat. Bowel sounds are normal.     Palpations: Abdomen is soft.     Tenderness: There is no abdominal tenderness. There is no guarding or rebound.  Musculoskeletal:  General: No swelling, tenderness or deformity.     Cervical back: Normal range of motion and neck supple. No rigidity.     Comments: Mildly tachypneic  Skin:    General: Skin is warm and dry.     Capillary Refill: Capillary refill takes less than 2 seconds.  Neurological:     General: No focal deficit present.     Mental Status: She is alert and oriented to person, place, and time.     Cranial Nerves: No cranial nerve deficit.     Sensory: No  sensory deficit.     Motor: No weakness.     Coordination: Coordination normal.     Gait: Gait normal.  Psychiatric:        Mood and Affect: Mood normal.    ED Results / Procedures / Treatments   Labs (all labs ordered are listed, but only abnormal results are displayed) Labs Reviewed  BASIC METABOLIC PANEL - Abnormal; Notable for the following components:      Result Value   Calcium 10.9 (*)    GFR, Estimated 60 (*)    All other components within normal limits  CBC - Abnormal; Notable for the following components:   WBC 17.1 (*)    All other components within normal limits  D-DIMER, QUANTITATIVE - Abnormal; Notable for the following components:   D-Dimer, Quant 0.83 (*)    All other components within normal limits  RESP PANEL BY RT-PCR (FLU A&B, COVID) [id number]  TROPONIN I (HIGH SENSITIVITY)  TROPONIN I (HIGH SENSITIVITY)    EKG EKG Interpretation  Date/Time:  Saturday November 10 2021 13:43:04 EST Ventricular Rate:  76 PR Interval:  140 QRS Duration: 76 QT Interval:  372 QTC Calculation: 418 R Axis:   37 Text Interpretation: Normal sinus rhythm Normal ECG When compared to prior, similar appearance. NO STEMI Confirmed by Antony Blackbird [id number]) on 11/10/2021 4:11:22 PM  Radiology DG Chest 2 View  Result Date: 11/10/2021 CLINICAL DATA:  Chest pain and shortness of breath for several weeks. Diabetes. EXAM: CHEST - 2 VIEW COMPARISON:  03/13/2020 FINDINGS: Atherosclerotic calcification of the aortic arch. Scarring or subsegmental atelectasis at the left lung base. Lungs appear otherwise clear. Heart size within normal limits. Substantial bilateral degenerative glenohumeral arthropathy. Lower cervical spondylosis. Moderate thoracic spondylosis. No blunting of the costophrenic angles. IMPRESSION: 1. No acute findings to explain the patient's symptoms. 2. Subsegmental atelectasis or scarring at the left lung base. 3.  Aortic Atherosclerosis (ICD10-I70.0). 4. Degenerative glenohumeral  arthropathy bilaterally. 5. Spondylosis. Electronically Signed   By: Van Clines M.D.   On: 11/10/2021 14:54   CT Angio Chest PE W and/or Wo Contrast  Result Date: 11/10/2021 CLINICAL DATA:  Cough and shortness of breath EXAM: CT ANGIOGRAPHY CHEST WITH CONTRAST TECHNIQUE: Multidetector CT imaging of the chest was performed using the standard protocol during bolus administration of intravenous contrast. Multiplanar CT image reconstructions and MIPs were obtained to evaluate the vascular anatomy. CONTRAST:  49mL OMNIPAQUE IOHEXOL 350 MG/ML SOLN COMPARISON:  None. FINDINGS: Cardiovascular: Contrast injection is sufficient to demonstrate satisfactory opacification of the pulmonary arteries to the segmental level. There is no pulmonary embolus or evidence of right heart strain. The size of the main pulmonary artery is normal. Heart size is normal, with no pericardial effusion. The course and caliber of the aorta are normal. There is atherosclerotic calcification. No acute aortic syndrome. Mediastinum/Nodes: No mediastinal, hilar or axillary lymphadenopathy. Normal visualized thyroid. Thoracic esophageal course is normal. Lungs/Pleura: Airways are patent. No  pleural effusion, lobar consolidation, pneumothorax or pulmonary infarction. Upper Abdomen: Contrast bolus timing is not optimized for evaluation of the abdominal organs. The visualized portions of the organs of the upper abdomen are normal. Musculoskeletal: No chest wall abnormality. No bony spinal canal stenosis. Review of the MIP images confirms the above findings. IMPRESSION: 1. No pulmonary embolus or acute aortic syndrome. Aortic Atherosclerosis (ICD10-I70.0). Electronically Signed   By: Ulyses Jarred M.D.   On: 11/10/2021 22:28    Procedures Procedures   Medications Ordered in ED Medications  alum & mag hydroxide-simeth (MAALOX/MYLANTA) 200-200-20 MG/5ML suspension 30 mL (30 mLs Oral Given 11/10/21 1653)    And  lidocaine (XYLOCAINE) 2 %  viscous mouth solution 15 mL (15 mLs Oral Given 11/10/21 1653)  hydrOXYzine (ATARAX) tablet 25 mg (25 mg Oral Given 11/10/21 1730)  iohexol (OMNIPAQUE) 350 MG/ML injection 80 mL (80 mLs Intravenous Contrast Given 11/10/21 2148)    ED Course  I have reviewed the triage vital signs and the nursing notes.  Pertinent labs & imaging results that were available during my care of the patient were reviewed by me and considered in my medical decision making (see chart for details).    MDM Rules/Calculators/A&P                          This is a 75 year old female presenting with shortness of breath and atypical chest pain as above.  She is afebrile and hemodynamically stable. EKG showed sinus rhythm, no ischemic changes.  Initial troponin normal.  Delta Trop unchanged.  Low concern for ACS.  COVID and flu negative.  Chest x-ray showed no signs of pneumonia, pneumothorax, pneumomediastinum.  White count mildly elevated, but nonspecific.  She is afebrile.  No infectious symptoms.  D-dimer is elevated.  CTA obtained and showed no signs of pulmonary embolism.  No signs of aortic pathology.  Patient was given a GI cocktail with complete resolution of her chest pain.  Presentation most consistent with reflux versus PUD.  Will initiate patient on PPI and have her follow-up with her PCP 3 to 5 days for reassessment.  Strict return ED precautions provided.  Final Clinical Impression(s) / ED Diagnoses Final diagnoses:  Chest pain, unspecified type    Rx / DC Orders ED Discharge Orders          Ordered    pantoprazole (PROTONIX) 20 MG tablet  Daily        11/10/21 2249             Idamae Lusher, MD 11/10/21 2252    Tegeler, Gwenyth Allegra, MD 11/13/21 1140

## 2021-12-04 ENCOUNTER — Other Ambulatory Visit: Payer: Self-pay | Admitting: Internal Medicine

## 2021-12-04 NOTE — Telephone Encounter (Signed)
Please refill as per office routine med refill policy (all routine meds to be refilled for 3 mo or monthly (per pt preference) up to one year from last visit, then month to month grace period for 3 mo, then further med refills will have to be denied) ? ?

## 2021-12-05 ENCOUNTER — Telehealth: Payer: Self-pay | Admitting: Internal Medicine

## 2021-12-05 MED ORDER — PANTOPRAZOLE SODIUM 20 MG PO TBEC: 20.0000 mg | DELAYED_RELEASE_TABLET | Freq: Every day | ORAL | 0 refills | Status: DC

## 2021-12-05 NOTE — Telephone Encounter (Signed)
1.Medication Requested: pantoprazole (PROTONIX) 20 MG tablet  2. Pharmacy (Name, [place]): [id number] - [address], [place]  Phone:  [phone] Fax:  [phone]   3. On Med List: yes  4. Last Visit with PCP: 04.13.22  5. Next visit date with PCP: n/a  **Advised patient med refill appt may be needed for all future refills**   Agent: Please be advised that RX refills may take up to 3 business days. We ask that you follow-up with your pharmacy.

## 2021-12-05 NOTE — Telephone Encounter (Addendum)
Sent in 30-day supply to patient's pharmacy, please schedule routine office visit as patient was due to come back in August.

## 2021-12-14 ENCOUNTER — Ambulatory Visit (INDEPENDENT_AMBULATORY_CARE_PROVIDER_SITE_OTHER): Payer: Medicare Other | Admitting: Internal Medicine

## 2021-12-14 ENCOUNTER — Encounter: Payer: Self-pay | Admitting: Internal Medicine

## 2021-12-14 ENCOUNTER — Other Ambulatory Visit: Payer: Self-pay

## 2021-12-14 VITALS — BP 128/74 | HR 96 | Ht 66.0 in | Wt 230.0 lb

## 2021-12-14 DIAGNOSIS — Z0001 Encounter for general adult medical examination with abnormal findings: Secondary | ICD-10-CM

## 2021-12-14 DIAGNOSIS — M25561 Pain in right knee: Secondary | ICD-10-CM | POA: Diagnosis not present

## 2021-12-14 DIAGNOSIS — K5900 Constipation, unspecified: Secondary | ICD-10-CM | POA: Diagnosis not present

## 2021-12-14 DIAGNOSIS — E034 Atrophy of thyroid (acquired): Secondary | ICD-10-CM | POA: Diagnosis not present

## 2021-12-14 DIAGNOSIS — K219 Gastro-esophageal reflux disease without esophagitis: Secondary | ICD-10-CM | POA: Diagnosis not present

## 2021-12-14 DIAGNOSIS — E559 Vitamin D deficiency, unspecified: Secondary | ICD-10-CM | POA: Diagnosis not present

## 2021-12-14 DIAGNOSIS — E538 Deficiency of other specified B group vitamins: Secondary | ICD-10-CM

## 2021-12-14 DIAGNOSIS — E119 Type 2 diabetes mellitus without complications: Secondary | ICD-10-CM | POA: Diagnosis not present

## 2021-12-14 DIAGNOSIS — Z1211 Encounter for screening for malignant neoplasm of colon: Secondary | ICD-10-CM

## 2021-12-14 DIAGNOSIS — G4700 Insomnia, unspecified: Secondary | ICD-10-CM | POA: Insufficient documentation

## 2021-12-14 DIAGNOSIS — F5101 Primary insomnia: Secondary | ICD-10-CM

## 2021-12-14 DIAGNOSIS — I10 Essential (primary) hypertension: Secondary | ICD-10-CM | POA: Diagnosis not present

## 2021-12-14 DIAGNOSIS — M25562 Pain in left knee: Secondary | ICD-10-CM

## 2021-12-14 DIAGNOSIS — Z23 Encounter for immunization: Secondary | ICD-10-CM

## 2021-12-14 DIAGNOSIS — G8929 Other chronic pain: Secondary | ICD-10-CM

## 2021-12-14 MED ORDER — POLYETHYLENE GLYCOL 3350 17 GM/SCOOP PO POWD: 17.0000 g | Freq: Two times a day (BID) | ORAL | 3 refills | Status: AC | PRN

## 2021-12-14 MED ORDER — PANTOPRAZOLE SODIUM 40 MG PO TBEC: 40.0000 mg | DELAYED_RELEASE_TABLET | Freq: Every day | ORAL | 3 refills | Status: DC

## 2021-12-14 MED ORDER — TRAZODONE HCL 50 MG PO TABS: ORAL_TABLET | ORAL | 1 refills | Status: DC

## 2021-12-14 NOTE — Progress Notes (Signed)
Patient ID: Alexandra Wolfe, female   DOB: 27-Mar-1946, 76 y.o.   MRN: [id number]         Chief Complaint:: wellness exam and Follow-up  Several issues including constipation, dm, bilateral knee pain, sleep difficutly, reflux       HPI:  Alexandra Wolfe is a 76 y.o. female here for wellness exam; declines shingrix o/w up to date.                          Also has 3 mo mild worsening reflux, but no abd pain, dysphagia, n/v, or blood.  Also has 2 mo worsening intermittent mild constipation and discomfort, nothing makes better or worse, has not tried miralax.  Due for colonoscopy.   Pt denies polydipsia, polyuria.  Pt denies chest pain, increased sob or doe, wheezing, orthopnea, PND, increased LE swelling, palpitations, dizziness or syncope.  Also has worsening bilateral knee pain, needs bilateral knee replacement but has been told she was too high risk with her comorbids, pt is asking for referral for second opinion.  Also has worsening sleep disorder, just cant get to sleep most night, was doing well on trazodone but ran out.  Due for flu shot.  Denies hyper or hypo thyroid symptoms such as voice, skin or hair change.    Wt Readings from Last 3 Encounters:  12/14/21 230 lb (104.3 kg)  03/21/21 222 lb (100.7 kg)  03/21/21 222 lb (100.7 kg)   BP Readings from Last 3 Encounters:  12/14/21 128/74  11/10/21 128/61  03/21/21 120/66   Immunization History  Administered Date(s) Administered   Fluad Quad(high Dose 65+) 12/14/2021   H1N1 11/17/2008   Influenza Whole 09/27/2008, 10/04/2009, 08/14/2010   Influenza, High Dose Seasonal PF 09/03/2017, 09/04/2018, 01/07/2020   Influenza, Seasonal, Injecte, Preservative Fre 11/09/2012   Influenza,inj,Quad PF,6+ Mos 11/24/2013, 12/22/2015, 08/22/2016   Moderna Sars-Covid-2 Vaccination 03/15/2020, 04/02/2020   Pneumococcal Conjugate-13 03/01/2015   Pneumococcal Polysaccharide-23 09/27/2008, 11/09/2012   Tdap 11/09/2012   There are no preventive care  reminders to display for this patient.     Past Medical History:  Diagnosis Date   ALLERGIC RHINITIS 04/02/2009   no per pt   Anxiety    BACK PAIN 09/27/2008   BUNIONS, BILATERAL 12/23/2007   CHEST DISCOMFORT, ATYPICAL 11/07/2009   Chronic LBP    COLONIC POLYPS, HX OF 09/13/2007   CONSTIPATION 09/27/2008   DEPRESSION 09/13/2007   Diabetes mellitus    diet controlled   DYSPNEA ON EXERTION 02/06/2010   Eustachian tube dysfunction 05/20/2011   FOOT PAIN, RIGHT 09/27/2008   HYPERLIPIDEMIA 03/11/2008   LBP (low back pain) 05/20/2011   Leukocytosis 11/20/2011   OSTEOARTHROSIS NOS, LOWER LEG 09/13/2007   SHOULDER PAIN, LEFT 02/14/2009   SVT (supraventricular tachycardia) ([place])    SYMPTOM, PALPITATIONS 09/13/2007   URINARY INCONTINENCE 08/23/2009   Vertigo 05/20/2011   Past Surgical History:  Procedure Laterality Date   BUNIONECTOMY     right   ccx     CHOLECYSTECTOMY     LUMBAR LAMINECTOMY     LUMBAR LAMINECTOMY     SHOULDER ARTHROSCOPY  12/13/2011   Procedure: ARTHROSCOPY SHOULDER;  Surgeon: Augustin Schooling;  Location: [place];  Service: Orthopedics;  Laterality: Left;  Left Shoulder ArthroscopyDebridement Limited Tenodesis Open Rotator Cuff Repair Spur Removal Right Shoulder Injection     reports that she has quit smoking. Her smoking use included cigarettes. She has a 10.00 pack-year smoking history. She has  never used smokeless tobacco. She reports that she does not drink alcohol and does not use drugs. family history includes Colon cancer in her mother; Diabetes in an other family member; Diabetes type II in her father; Heart disease in her brother and father. Allergies  Allergen Reactions   Aripiprazole     REACTION: agitation   Simvastatin     REACTION: myalgia   Current Outpatient Medications on File Prior to Visit  Medication Sig Dispense Refill   acetaminophen (TYLENOL) 325 MG tablet Take 2 tablets (650 mg total) by mouth every 6 (six) hours as needed for mild pain or headache.      albuterol (VENTOLIN HFA) 108 (90 Base) MCG/ACT inhaler INHALE 2 PUFFS INTO THE LUNGS EVERY 6 HOURS AS NEEDED FOR WHEEZING OR SHORTNESS OF BREATH 8.5 g 7   aspirin 81 MG EC tablet Take by mouth.     atorvastatin (LIPITOR) 80 MG tablet TAKE 1 TABLET(80 MG) BY MOUTH DAILY 90 tablet 1   budesonide-formoterol (SYMBICORT) 160-4.5 MCG/ACT inhaler Inhale 2 puffs into the lungs 2 (two) times daily. 6 g 2   Calcium Carbonate (CALCIUM 500 PO) Take 1,000 mg by mouth 2 (two) times daily.      cetirizine (ZYRTEC) 10 MG tablet Take 1 tablet (10 mg total) by mouth daily. 90 tablet 3   diclofenac (VOLTAREN) 75 MG EC tablet TAKE 1 TABLET(75 MG) BY MOUTH TWICE DAILY 180 tablet 2   ergocalciferol (VITAMIN D2) 1.25 MG (50000 UT) capsule Take 1 tablet by mouth daily.     fluticasone (FLONASE) 50 MCG/ACT nasal spray SHAKE LIQUID AND USE 2 SPRAYS IN EACH NOSTRIL DAILY 48 g 1   glucose blood (ONETOUCH ULTRA) test strip Use as instructed three times daily E11.9 300 each 12   levETIRAcetam (KEPPRA) 500 MG tablet Take 1 tablet (500 mg total) by mouth 2 (two) times daily. 180 tablet 1   levothyroxine (SYNTHROID) 50 MCG tablet TAKE 1 TABLET(50 MCG) BY MOUTH DAILY 30 tablet 0   meclizine (ANTIVERT) 25 MG tablet Take 1 tablet (25 mg total) by mouth 3 (three) times daily as needed for dizziness. 90 tablet 0   meloxicam (MOBIC) 15 MG tablet TAKE 1 TABLET(15 MG) BY MOUTH DAILY 90 tablet 0   metFORMIN (GLUCOPHAGE) 500 MG tablet Take 1 tablet (500 mg total) by mouth 2 (two) times daily with a meal. 180 tablet 3   metoprolol succinate (TOPROL-XL) 25 MG 24 hr tablet TAKE 1 TABLET(25 MG) BY MOUTH DAILY 90 tablet 2   Multiple Vitamin (MULTIVITAMIN WITH MINERALS) TABS tablet Take 1 tablet by mouth daily.     MYRBETRIQ 50 MG TB24 tablet Take 1 tablet (50 mg total) by mouth at bedtime. 30 tablet 0   NON FORMULARY Heart healthy lcs diet     NP THYROID 30 MG tablet Take 1 tablet (30 mg total) by mouth daily. 30 tablet 0   ondansetron  (ZOFRAN) 4 MG tablet Take 1 tablet (4 mg total) by mouth every 8 (eight) hours as needed for nausea or vomiting. 30 tablet 0   oxybutynin (DITROPAN-XL) 10 MG 24 hr tablet Take by mouth.     phentermine 30 MG capsule Take 1 capsule (30 mg total) by mouth every morning. 30 capsule 2   senna-docusate (SENOKOT-S) 8.6-50 MG tablet Take 1 tablet by mouth at bedtime as needed for mild constipation.     triamcinolone cream (KENALOG) 0.1 % Apply 1 application topically 2 (two) times daily. 30 g 1   No  current facility-administered medications on file prior to visit.        ROS:  All others reviewed and negative.  Objective        PE:  BP 128/74 (BP Location: Left Arm, Patient Position: Sitting, Cuff Size: Large)    Pulse 96    Ht 5\' 6"  (1.676 m)    Wt 230 lb (104.3 kg)    SpO2 97%    BMI 37.12 kg/m                 Constitutional: Pt appears in NAD               HENT: Head: NCAT.                Right Ear: External ear normal.                 Left Ear: External ear normal.                Eyes: . Pupils are equal, round, and reactive to light. Conjunctivae and EOM are normal               Nose: without d/c or deformity               Neck: Neck supple. Gross normal ROM               Cardiovascular: Normal rate and regular rhythm.                 Pulmonary/Chest: Effort normal and breath sounds without rales or wheezing.                Abd:  Soft, NT, ND, + BS, no organomegaly               Neurological: Pt is alert. At baseline orientation, motor grossly intact               Skin: Skin is warm. No rashes, no other new lesions, LE edema - none               Psychiatric: Pt behavior is normal without agitation   Micro: none  Cardiac tracings I have personally interpreted today:  none  Pertinent Radiological findings (summarize): none   Lab Results  Component Value Date   WBC 17.8 (H) 12/14/2021   HGB 14.6 12/14/2021   HCT 42.8 12/14/2021   PLT 470 (H) 12/14/2021   GLUCOSE 101 (H) 12/14/2021    CHOL 157 12/14/2021   TRIG 259 (H) 12/14/2021   HDL 45 (L) 12/14/2021   LDLDIRECT 74.0 01/04/2020   LDLCALC 78 12/14/2021   ALT 32 (H) 01/12/2021   AST 35 01/12/2021   NA 137 12/14/2021   K 4.8 12/14/2021   CL 103 12/14/2021   CREATININE 0.95 12/14/2021   BUN 26 (H) 12/14/2021   CO2 21 12/14/2021   TSH 2.60 12/14/2021   INR 1.1 05/09/2020   HGBA1C 5.8 (H) 12/14/2021   MICROALBUR 0.4 12/14/2021   Assessment/Plan:  CHRISTABELLA ALVIRA is a 76 y.o. White or Caucasian [1] female with  has a past medical history of ALLERGIC RHINITIS (04/02/2009), Anxiety, BACK PAIN (09/27/2008), BUNIONS, BILATERAL (12/23/2007), CHEST DISCOMFORT, ATYPICAL (11/07/2009), Chronic LBP, COLONIC POLYPS, HX OF (09/13/2007), CONSTIPATION (09/27/2008), DEPRESSION (09/13/2007), Diabetes mellitus, DYSPNEA ON EXERTION (02/06/2010), Eustachian tube dysfunction (05/20/2011), FOOT PAIN, RIGHT (09/27/2008), HYPERLIPIDEMIA (03/11/2008), LBP (low back pain) (05/20/2011), Leukocytosis (11/20/2011), OSTEOARTHROSIS NOS, LOWER LEG (09/13/2007), SHOULDER PAIN, LEFT (02/14/2009), SVT (supraventricular tachycardia) ([place]),  SYMPTOM, PALPITATIONS (09/13/2007), URINARY INCONTINENCE (08/23/2009), and Vertigo (05/20/2011).  Encounter for well adult exam with abnormal findings Age and sex appropriate education and counseling updated with regular exercise and diet Referrals for preventative services - none needed Immunizations addressed - declines shingirx Smoking counseling  - none needed Evidence for depression or other mood disorder - none significant Most recent labs reviewed. I have personally reviewed and have noted: 1) the patient's medical and social history 2) The patient's current medications and supplements 3) The patient's height, weight, and BMI have been recorded in the chart   Constipation Mild uncontrolled, for miralax daily prn  Diabetes mellitus type II, non insulin dependent ([place]) Lab Results  Component Value Date   HGBA1C 5.8  (H) 12/14/2021   Stable, pt to continue current medical treatment metformin,   Essential hypertension BP Readings from Last 3 Encounters:  12/14/21 128/74  11/10/21 128/61  03/21/21 120/66   Stable, pt to continue medical treatment toprol xl   GERD (gastroesophageal reflux disease) Mild uncontrolled, for increased protonix 40 qd  Hypothyroidism Lab Results  Component Value Date   TSH 2.60 12/14/2021   Stable, pt to continue levothyroxie   Insomnia Mild uncontrolled, for restart trazodone qhs prn  Chronic pain of both knees Ok for ortho referral for second opinion on whether eligible for surgury  Followup: Return in about 6 months (around 06/13/2022).  Cathlean Cower, MD 12/15/2021 7:47 PM [facility]

## 2021-12-14 NOTE — Patient Instructions (Signed)
You had the flu shot today  Ok to increase the protonix to 40 mg per day  Ok to start the miralax daily for constipation  Please continue all other medications as before, and refills have been done if requested - the trazodone  Please have the pharmacy call with any other refills you may need.  Please continue your efforts at being more active, low cholesterol diet, and weight control.  You are otherwise up to date with prevention measures today.  Please keep your appointments with your specialists as you may have planned  You will be contacted regarding the referral for: colonoscopy, and [facility] for second opinion on the knees  Please go to the LAB at the blood drawing area for the tests to be done  You will be contacted by phone if any changes need to be made immediately.  Otherwise, you will receive a letter about your results with an explanation, but please check with MyChart first.  Please remember to sign up for MyChart if you have not done so, as this will be important to you in the future with finding out test results, communicating by private email, and scheduling acute appointments online when needed.  Please make an Appointment to return in 6 months, or sooner if needed

## 2021-12-15 ENCOUNTER — Encounter: Payer: Self-pay | Admitting: Internal Medicine

## 2021-12-15 DIAGNOSIS — M25561 Pain in right knee: Secondary | ICD-10-CM | POA: Insufficient documentation

## 2021-12-15 DIAGNOSIS — G8929 Other chronic pain: Secondary | ICD-10-CM | POA: Insufficient documentation

## 2021-12-15 LAB — MICROALBUMIN / CREATININE URINE RATIO
Creatinine, Urine: 94 mg/dL (ref 20–275)
Microalb Creat Ratio: 4 mcg/mg creat (ref <30)
Microalb, Ur: 0.4 mg/dL

## 2021-12-15 LAB — BASIC METABOLIC PANEL
BUN/Creatinine Ratio: 27 (calc) — ABNORMAL HIGH (ref 6–22)
BUN: 26 mg/dL — ABNORMAL HIGH (ref 7–25)
CO2: 21 mmol/L (ref 20–32)
Calcium: 10.4 mg/dL (ref 8.6–10.4)
Chloride: 103 mmol/L (ref 98–110)
Creat: 0.95 mg/dL (ref 0.60–1.00)
Glucose, Bld: 101 mg/dL — ABNORMAL HIGH (ref 65–99)
Potassium: 4.8 mmol/L (ref 3.5–5.3)
Sodium: 137 mmol/L (ref 135–146)

## 2021-12-15 LAB — LIPID PANEL
Cholesterol: 157 mg/dL (ref <200)
HDL: 45 mg/dL — ABNORMAL LOW (ref >=50)
LDL Cholesterol (Calc): 78 mg/dL (calc)
Non-HDL Cholesterol (Calc): 112 mg/dL (calc) (ref <130)
Total CHOL/HDL Ratio: 3.5 (calc) (ref <5.0)
Triglycerides: 259 mg/dL — ABNORMAL HIGH (ref <150)

## 2021-12-15 LAB — CBC WITH DIFFERENTIAL/PLATELET
Absolute Monocytes: 1193 cells/uL — ABNORMAL HIGH (ref 200–950)
Basophils Absolute: 125 cells/uL (ref 0–200)
Basophils Relative: 0.7 %
Eosinophils Absolute: 783 cells/uL — ABNORMAL HIGH (ref 15–500)
Eosinophils Relative: 4.4 %
HCT: 42.8 % (ref 35.0–45.0)
Hemoglobin: 14.6 g/dL (ref 11.7–15.5)
Lymphs Abs: 4877 cells/uL — ABNORMAL HIGH (ref 850–3900)
MCH: 30.7 pg (ref 27.0–33.0)
MCHC: 34.1 g/dL (ref 32.0–36.0)
MCV: 90.1 fL (ref 80.0–100.0)
MPV: 10.4 fL (ref 7.5–12.5)
Monocytes Relative: 6.7 %
Neutro Abs: 10822 cells/uL — ABNORMAL HIGH (ref 1500–7800)
Neutrophils Relative %: 60.8 %
Platelets: 470 10*3/uL — ABNORMAL HIGH (ref 140–400)
RBC: 4.75 10*6/uL (ref 3.80–5.10)
RDW: 12.5 % (ref 11.0–15.0)
Total Lymphocyte: 27.4 %
WBC: 17.8 10*3/uL — ABNORMAL HIGH (ref 3.8–10.8)

## 2021-12-15 LAB — HEMOGLOBIN A1C
Hgb A1c MFr Bld: 5.8 % of total Hgb — ABNORMAL HIGH (ref <5.7)
Mean Plasma Glucose: 120 mg/dL
eAG (mmol/L): 6.6 mmol/L

## 2021-12-15 LAB — VITAMIN B12: Vitamin B-12: 800 pg/mL (ref 200–1100)

## 2021-12-15 LAB — TSH: TSH: 2.6 mIU/L (ref 0.40–4.50)

## 2021-12-15 LAB — VITAMIN D 25 HYDROXY (VIT D DEFICIENCY, FRACTURES): Vit D, 25-Hydroxy: 47 ng/mL (ref 30–100)

## 2021-12-15 NOTE — Assessment & Plan Note (Signed)
BP Readings from Last 3 Encounters:  12/14/21 128/74  11/10/21 128/61  03/21/21 120/66   Stable, pt to continue medical treatment toprol xl

## 2021-12-15 NOTE — Assessment & Plan Note (Signed)
Age and sex appropriate education and counseling updated with regular exercise and diet °Referrals for preventative services - none needed °Immunizations addressed - declines shingirx °Smoking counseling  - none needed °Evidence for depression or other mood disorder - none significant °Most recent labs reviewed. °I have personally reviewed and have noted: °1) the patient's medical and social history °2) The patient's current medications and supplements °3) The patient's height, weight, and BMI have been recorded in the chart ° °

## 2021-12-15 NOTE — Assessment & Plan Note (Signed)
Mild uncontrolled, for increased protonix 40 qd

## 2021-12-15 NOTE — Assessment & Plan Note (Signed)
Mild uncontrolled, for restart trazodone qhs prn

## 2021-12-15 NOTE — Assessment & Plan Note (Signed)
Mild uncontrolled, for miralax daily prn

## 2021-12-15 NOTE — Assessment & Plan Note (Signed)
Lab Results  Component Value Date   TSH 2.60 12/14/2021   Stable, pt to continue levothyroxie

## 2021-12-15 NOTE — Assessment & Plan Note (Signed)
[place] for ortho referral for second opinion on whether eligible for surgury

## 2021-12-15 NOTE — Assessment & Plan Note (Signed)
Lab Results  Component Value Date   HGBA1C 5.8 (H) 12/14/2021   Stable, pt to continue current medical treatment metformin,

## 2021-12-16 ENCOUNTER — Other Ambulatory Visit: Payer: Self-pay | Admitting: Internal Medicine

## 2021-12-16 ENCOUNTER — Encounter: Payer: Self-pay | Admitting: Internal Medicine

## 2021-12-16 DIAGNOSIS — D72829 Elevated white blood cell count, unspecified: Secondary | ICD-10-CM

## 2021-12-17 ENCOUNTER — Other Ambulatory Visit: Payer: Self-pay | Admitting: Internal Medicine

## 2021-12-17 DIAGNOSIS — I10 Essential (primary) hypertension: Secondary | ICD-10-CM

## 2021-12-17 DIAGNOSIS — E1169 Type 2 diabetes mellitus with other specified complication: Secondary | ICD-10-CM

## 2021-12-17 NOTE — Progress Notes (Signed)
Please print and send letter from [place]

## 2021-12-18 DIAGNOSIS — M25562 Pain in left knee: Secondary | ICD-10-CM | POA: Diagnosis not present

## 2021-12-18 DIAGNOSIS — M25561 Pain in right knee: Secondary | ICD-10-CM | POA: Diagnosis not present

## 2021-12-19 DIAGNOSIS — M1711 Unilateral primary osteoarthritis, right knee: Secondary | ICD-10-CM | POA: Diagnosis not present

## 2021-12-19 DIAGNOSIS — M1712 Unilateral primary osteoarthritis, left knee: Secondary | ICD-10-CM | POA: Diagnosis not present

## 2021-12-19 DIAGNOSIS — M6281 Muscle weakness (generalized): Secondary | ICD-10-CM | POA: Diagnosis not present

## 2021-12-20 ENCOUNTER — Telehealth: Payer: Self-pay | Admitting: Physician Assistant

## 2021-12-20 ENCOUNTER — Telehealth: Payer: Self-pay | Admitting: Internal Medicine

## 2021-12-20 NOTE — Telephone Encounter (Signed)
Scheduled appt per 1/9 referral. Pt is aware of appt date and time. Pt is aware to arrive 15 mins prior to appt time.  °

## 2021-12-20 NOTE — Telephone Encounter (Signed)
Fax received from Dr. Charlies Constable with [facility] to perform a Left total knee replacement on patient.  Patient needs surgery clearance. Patient was seen on 07/06/2018 and has been scheduled for OV on 01/08/2021. Office protocol is a risk assessment can be sent to surgeon if patient has been seen in 60 days or less.   Sending to [facility] for risk assessment

## 2021-12-22 ENCOUNTER — Other Ambulatory Visit: Payer: Self-pay | Admitting: Podiatry

## 2021-12-23 ENCOUNTER — Emergency Department (HOSPITAL_COMMUNITY): Payer: Medicare Other

## 2021-12-23 ENCOUNTER — Other Ambulatory Visit: Payer: Self-pay

## 2021-12-23 ENCOUNTER — Encounter (HOSPITAL_COMMUNITY): Payer: Self-pay

## 2021-12-23 ENCOUNTER — Emergency Department (HOSPITAL_COMMUNITY)
Admission: EM | Admit: 2021-12-23 | Discharge: 2021-12-23 | Disposition: A | Discharge status: Home / Self Care | Payer: Medicare Other | Attending: Emergency Medicine | Admitting: Emergency Medicine

## 2021-12-23 DIAGNOSIS — Z7982 Long term (current) use of aspirin: Secondary | ICD-10-CM | POA: Insufficient documentation

## 2021-12-23 DIAGNOSIS — S50311A Abrasion of right elbow, initial encounter: Secondary | ICD-10-CM | POA: Insufficient documentation

## 2021-12-23 DIAGNOSIS — S50312A Abrasion of left elbow, initial encounter: Secondary | ICD-10-CM | POA: Insufficient documentation

## 2021-12-23 DIAGNOSIS — M25512 Pain in left shoulder: Secondary | ICD-10-CM | POA: Insufficient documentation

## 2021-12-23 DIAGNOSIS — S8991XA Unspecified injury of right lower leg, initial encounter: Secondary | ICD-10-CM | POA: Diagnosis present

## 2021-12-23 DIAGNOSIS — M19012 Primary osteoarthritis, left shoulder: Secondary | ICD-10-CM | POA: Diagnosis not present

## 2021-12-23 DIAGNOSIS — S80211A Abrasion, right knee, initial encounter: Secondary | ICD-10-CM | POA: Diagnosis not present

## 2021-12-23 DIAGNOSIS — M25551 Pain in right hip: Secondary | ICD-10-CM | POA: Insufficient documentation

## 2021-12-23 DIAGNOSIS — I700 Atherosclerosis of aorta: Secondary | ICD-10-CM | POA: Diagnosis not present

## 2021-12-23 DIAGNOSIS — R070 Pain in throat: Secondary | ICD-10-CM | POA: Diagnosis not present

## 2021-12-23 DIAGNOSIS — S80212A Abrasion, left knee, initial encounter: Secondary | ICD-10-CM | POA: Insufficient documentation

## 2021-12-23 DIAGNOSIS — M25561 Pain in right knee: Secondary | ICD-10-CM | POA: Diagnosis not present

## 2021-12-23 DIAGNOSIS — S4992XA Unspecified injury of left shoulder and upper arm, initial encounter: Secondary | ICD-10-CM | POA: Diagnosis not present

## 2021-12-23 DIAGNOSIS — U071 COVID-19: Secondary | ICD-10-CM | POA: Insufficient documentation

## 2021-12-23 DIAGNOSIS — M25552 Pain in left hip: Secondary | ICD-10-CM | POA: Insufficient documentation

## 2021-12-23 DIAGNOSIS — S0990XA Unspecified injury of head, initial encounter: Secondary | ICD-10-CM | POA: Diagnosis not present

## 2021-12-23 DIAGNOSIS — W19XXXA Unspecified fall, initial encounter: Secondary | ICD-10-CM | POA: Diagnosis not present

## 2021-12-23 DIAGNOSIS — G8929 Other chronic pain: Secondary | ICD-10-CM | POA: Insufficient documentation

## 2021-12-23 DIAGNOSIS — M25562 Pain in left knee: Secondary | ICD-10-CM | POA: Diagnosis not present

## 2021-12-23 DIAGNOSIS — R0689 Other abnormalities of breathing: Secondary | ICD-10-CM | POA: Diagnosis not present

## 2021-12-23 DIAGNOSIS — Z043 Encounter for examination and observation following other accident: Secondary | ICD-10-CM | POA: Diagnosis not present

## 2021-12-23 LAB — CBC WITH DIFFERENTIAL/PLATELET
Abs Immature Granulocytes: 0.05 10*3/uL (ref 0.00–0.07)
Basophils Absolute: 0.1 10*3/uL (ref 0.0–0.1)
Basophils Relative: 1 %
Eosinophils Absolute: 0.1 10*3/uL (ref 0.0–0.5)
Eosinophils Relative: 1 %
HCT: 39.3 % (ref 36.0–46.0)
Hemoglobin: 13.3 g/dL (ref 12.0–15.0)
Immature Granulocytes: 0 %
Lymphocytes Relative: 17 %
Lymphs Abs: 2.1 10*3/uL (ref 0.7–4.0)
MCH: 30.8 pg (ref 26.0–34.0)
MCHC: 33.8 g/dL (ref 30.0–36.0)
MCV: 91 fL (ref 80.0–100.0)
Monocytes Absolute: 1.1 10*3/uL — ABNORMAL HIGH (ref 0.1–1.0)
Monocytes Relative: 9 %
Neutro Abs: 8.9 10*3/uL — ABNORMAL HIGH (ref 1.7–7.7)
Neutrophils Relative %: 72 %
Platelets: 320 10*3/uL (ref 150–400)
RBC: 4.32 MIL/uL (ref 3.87–5.11)
RDW: 13 % (ref 11.5–15.5)
WBC: 12.2 10*3/uL — ABNORMAL HIGH (ref 4.0–10.5)
nRBC: 0 % (ref 0.0–0.2)

## 2021-12-23 LAB — RESP PANEL BY RT-PCR (FLU A&B, COVID) ARPGX2
Influenza A by PCR: NEGATIVE
Influenza B by PCR: NEGATIVE
SARS Coronavirus 2 by RT PCR: POSITIVE — AB

## 2021-12-23 LAB — COMPREHENSIVE METABOLIC PANEL
ALT: 31 U/L (ref 0–44)
AST: 36 U/L (ref 15–41)
Albumin: 3.9 g/dL (ref 3.5–5.0)
Alkaline Phosphatase: 102 U/L (ref 38–126)
Anion gap: 13 (ref 5–15)
BUN: 17 mg/dL (ref 8–23)
CO2: 21 mmol/L — ABNORMAL LOW (ref 22–32)
Calcium: 9.6 mg/dL (ref 8.9–10.3)
Chloride: 104 mmol/L (ref 98–111)
Creatinine, Ser: 0.94 mg/dL (ref 0.44–1.00)
GFR, Estimated: >60 mL/min (ref >60)
Glucose, Bld: 152 mg/dL — ABNORMAL HIGH (ref 70–99)
Potassium: 4.5 mmol/L (ref 3.5–5.1)
Sodium: 138 mmol/L (ref 135–145)
Total Bilirubin: 0.8 mg/dL (ref 0.3–1.2)
Total Protein: 6.7 g/dL (ref 6.5–8.1)

## 2021-12-23 LAB — CK: Total CK: 253 U/L — ABNORMAL HIGH (ref 38–234)

## 2021-12-23 LAB — TROPONIN I (HIGH SENSITIVITY): Troponin I (High Sensitivity): 9 ng/L (ref <18)

## 2021-12-23 IMAGING — DX DG KNEE COMPLETE 4+V*L*
4 series · 4 of 4 positions shown · non-contrast
Comparison: None.

CLINICAL DATA: Status post fall. Abrasions to the knees and elbows.

EXAM:
LEFT KNEE - COMPLETE 4+ VIEW

[knee ap]
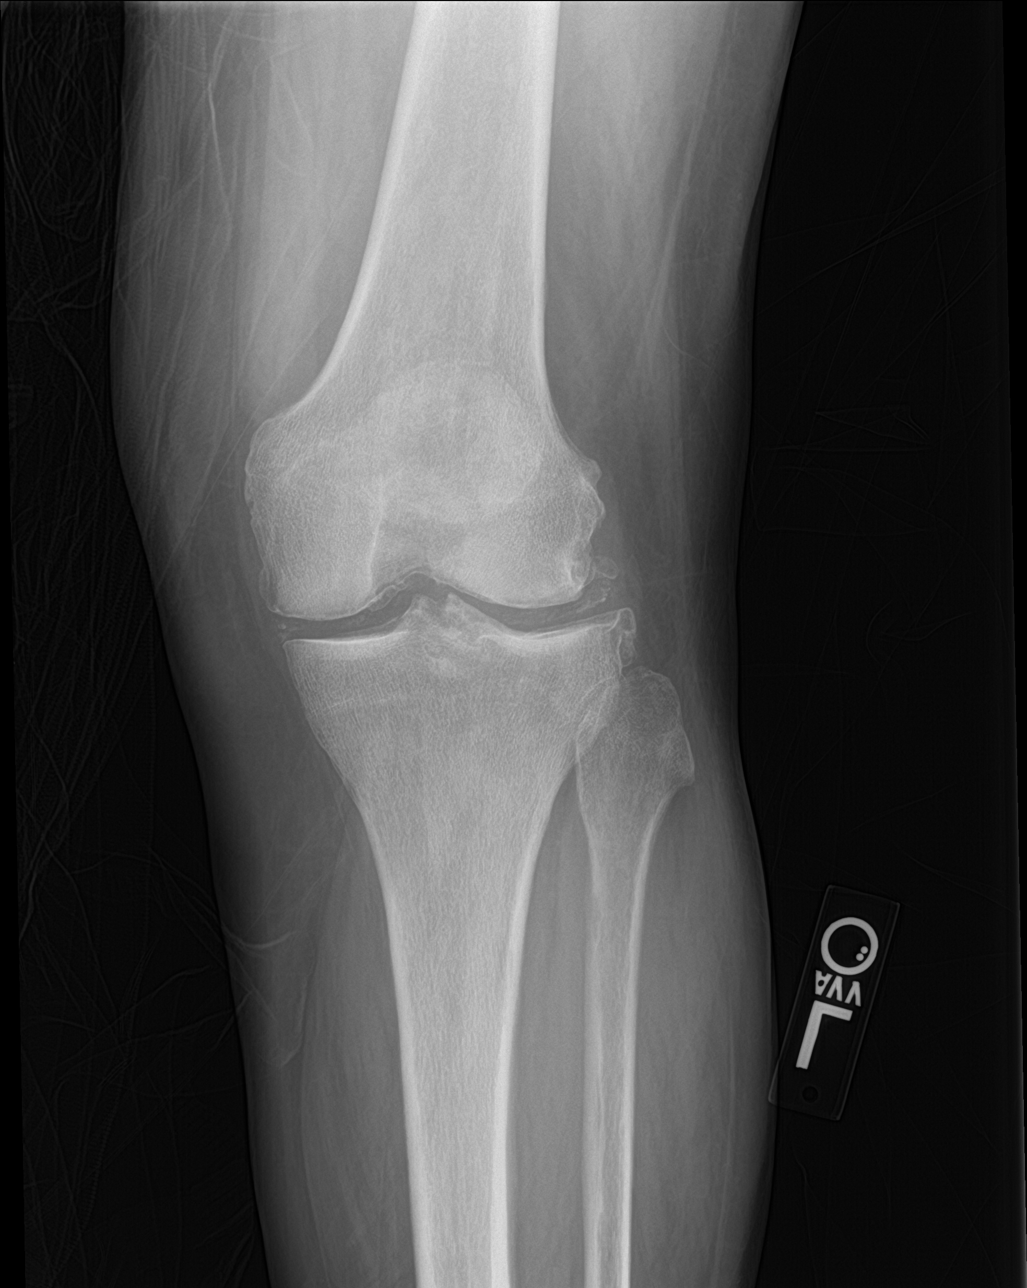

[knee lat]
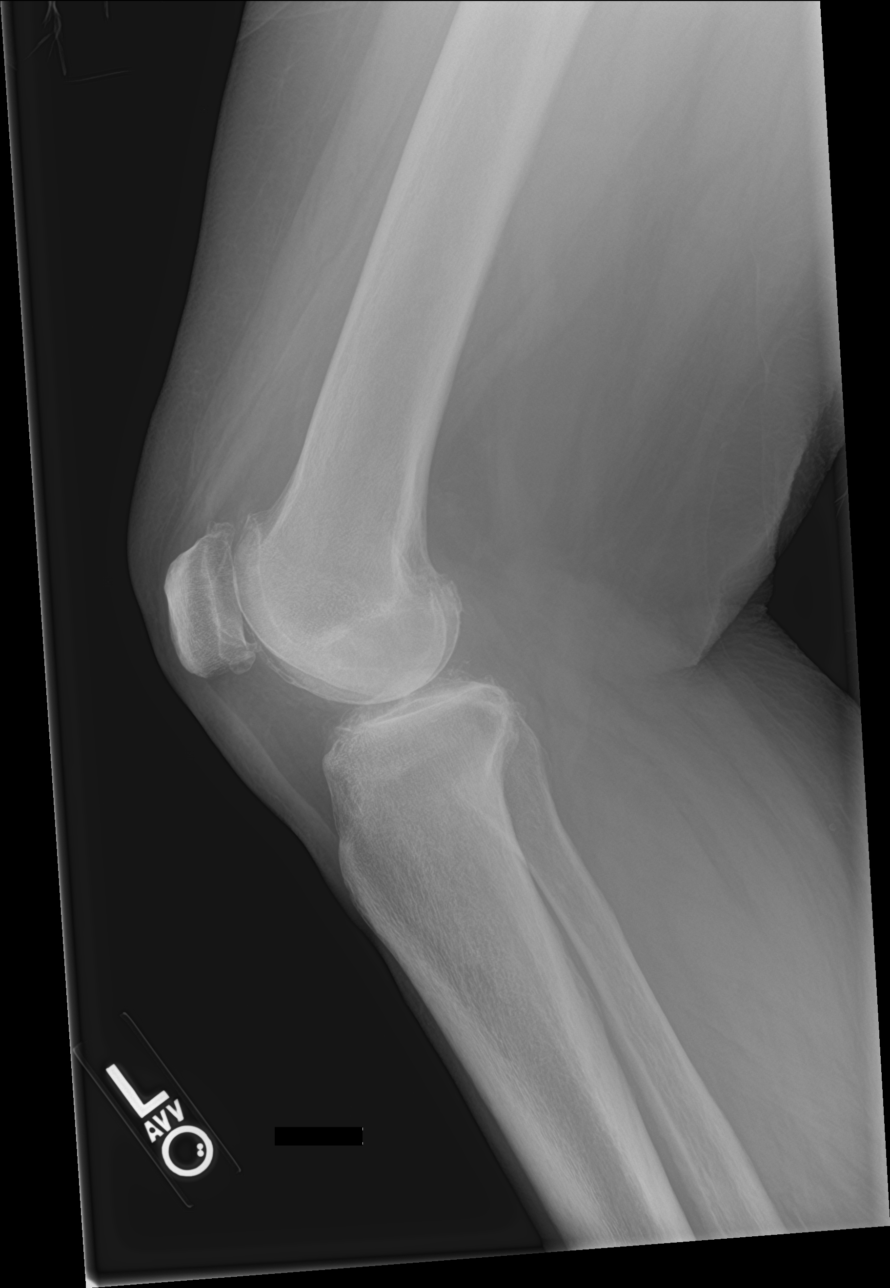

[knee obl (1 of 2)]
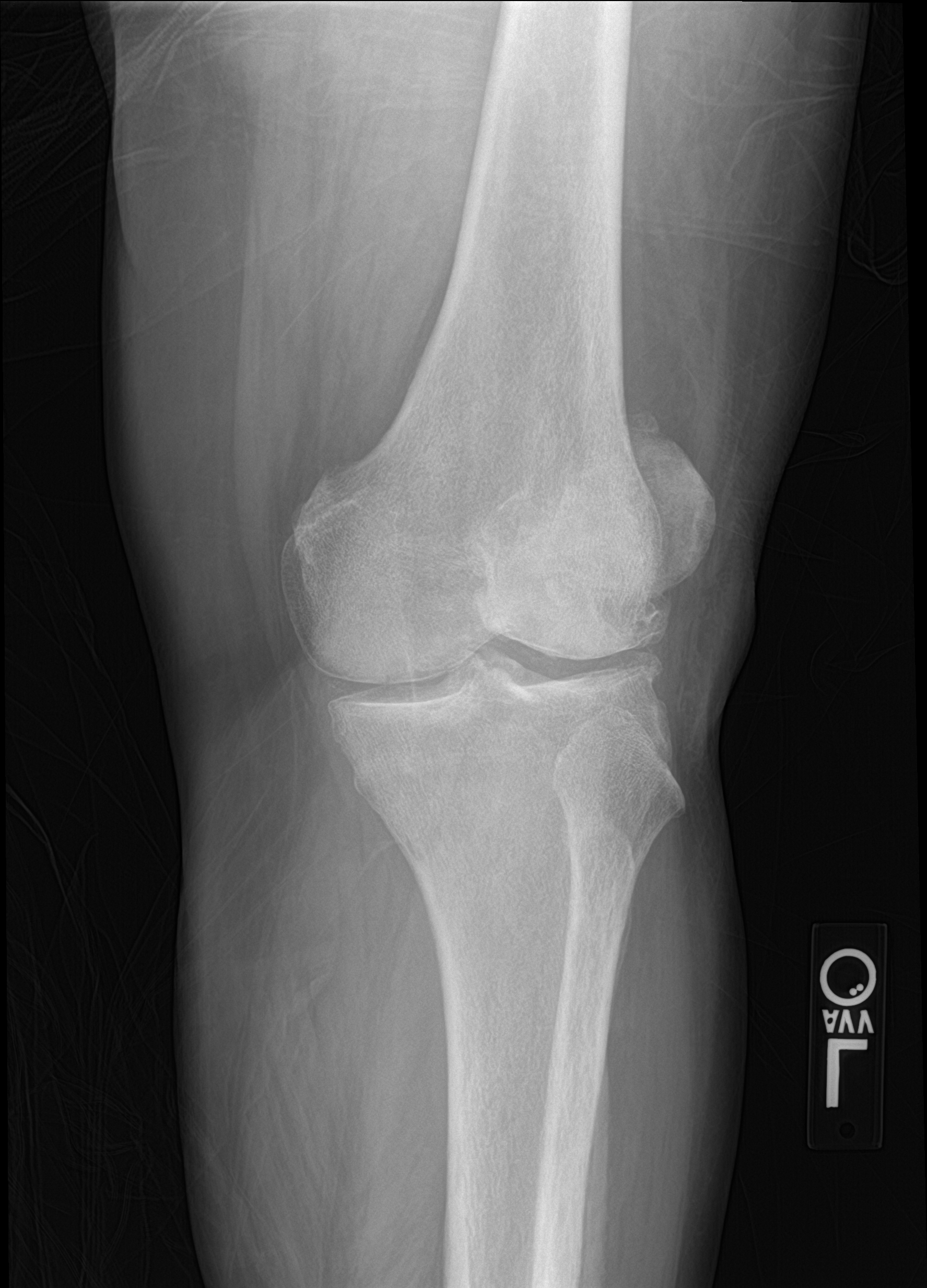

[knee obl (2 of 2)]
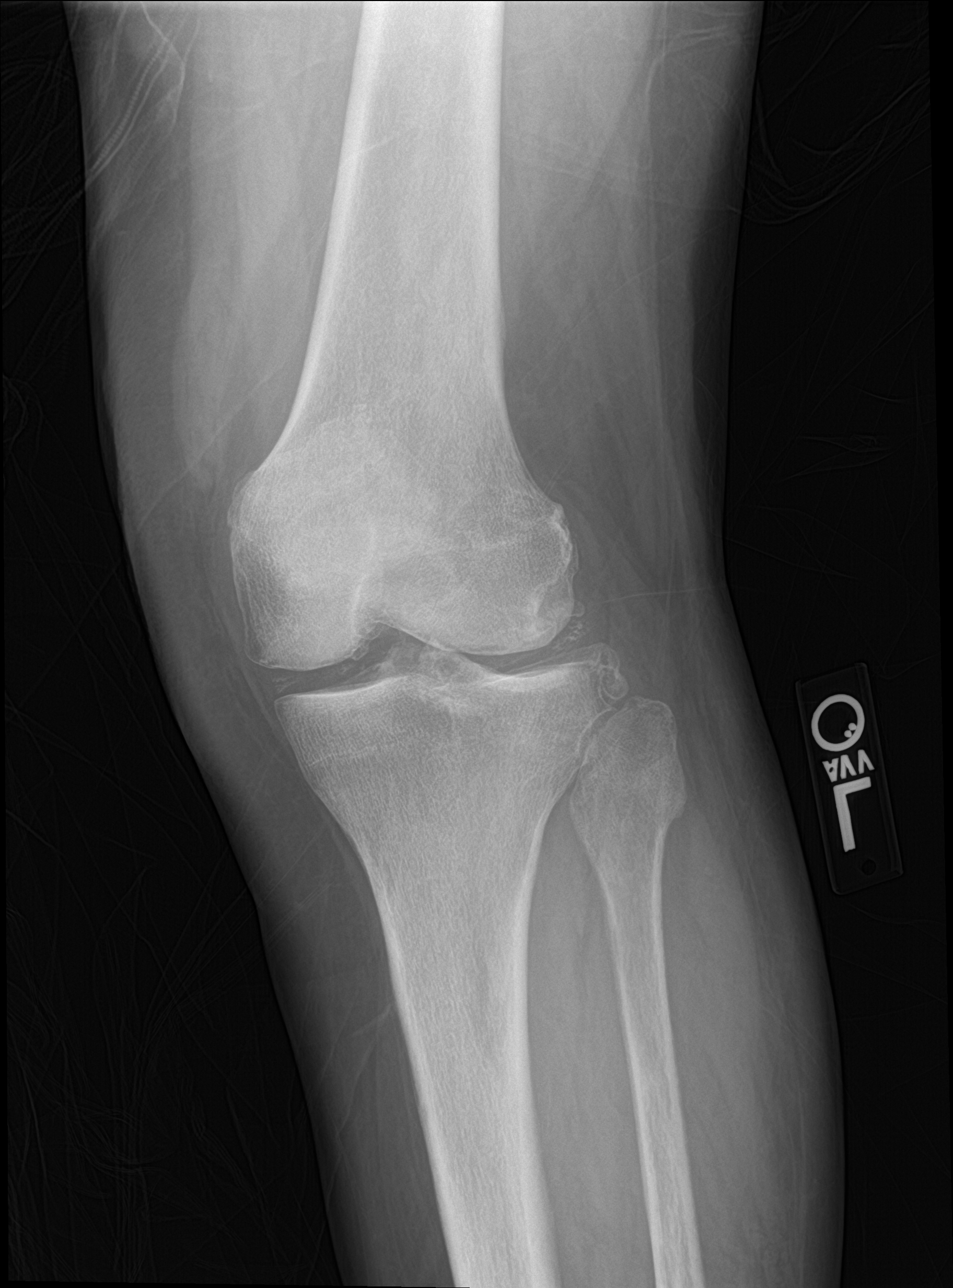

[4 of 4 positions shown; findings below may reference images not displayed]

FINDINGS: No evidence of fracture, dislocation, or joint effusion.
Chondrocalcinosis in mediolateral compartments. Moderate in severity
arthritic changes.
IMPRESSION: 1. No acute fracture or dislocation identified about the left knee.
2. Chondrocalcinosis, usually associated with CPPD arthropathy.

## 2021-12-23 IMAGING — DX DG HIP (WITH OR WITHOUT PELVIS) 2-3V*R*
3 series · 3 of 3 positions shown · non-contrast
Comparison: None.

CLINICAL DATA: Status post fall.  Right hip pain.

EXAM:
DG HIP (WITH OR WITHOUT PELVIS) 2-3V RIGHT

[pelvis ap]
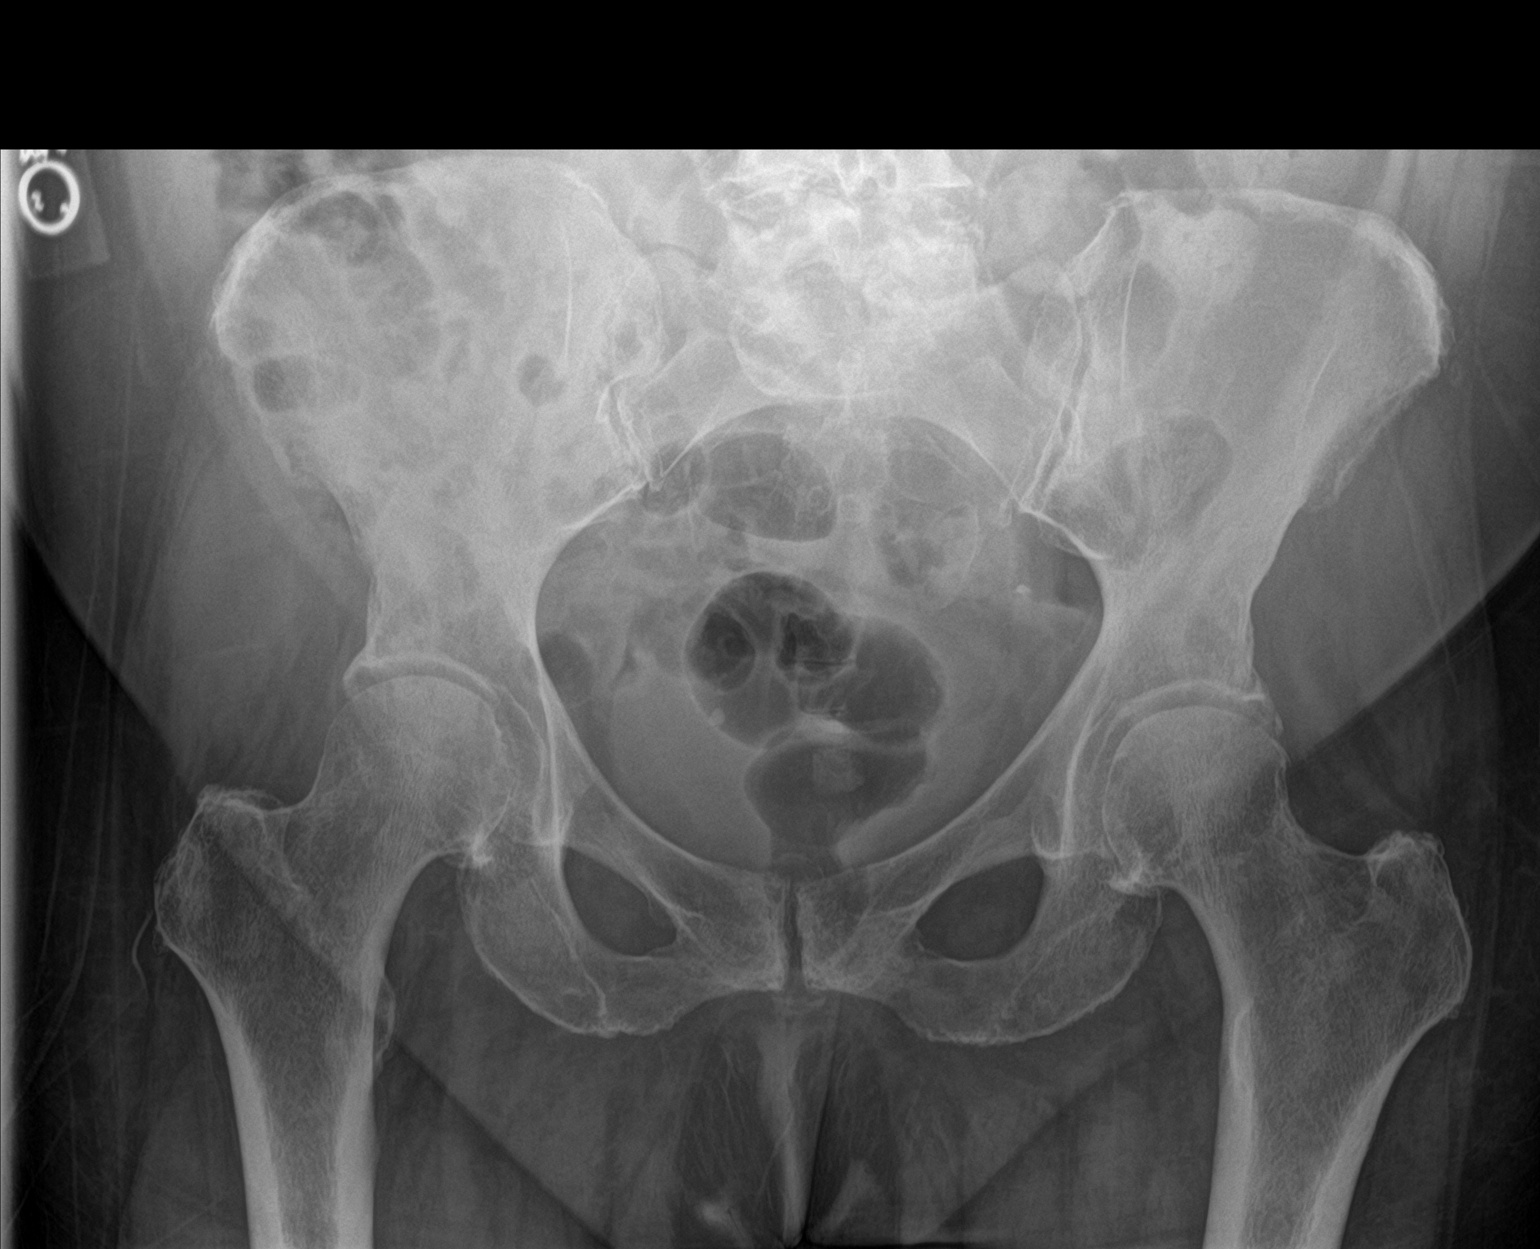

[hip ap]
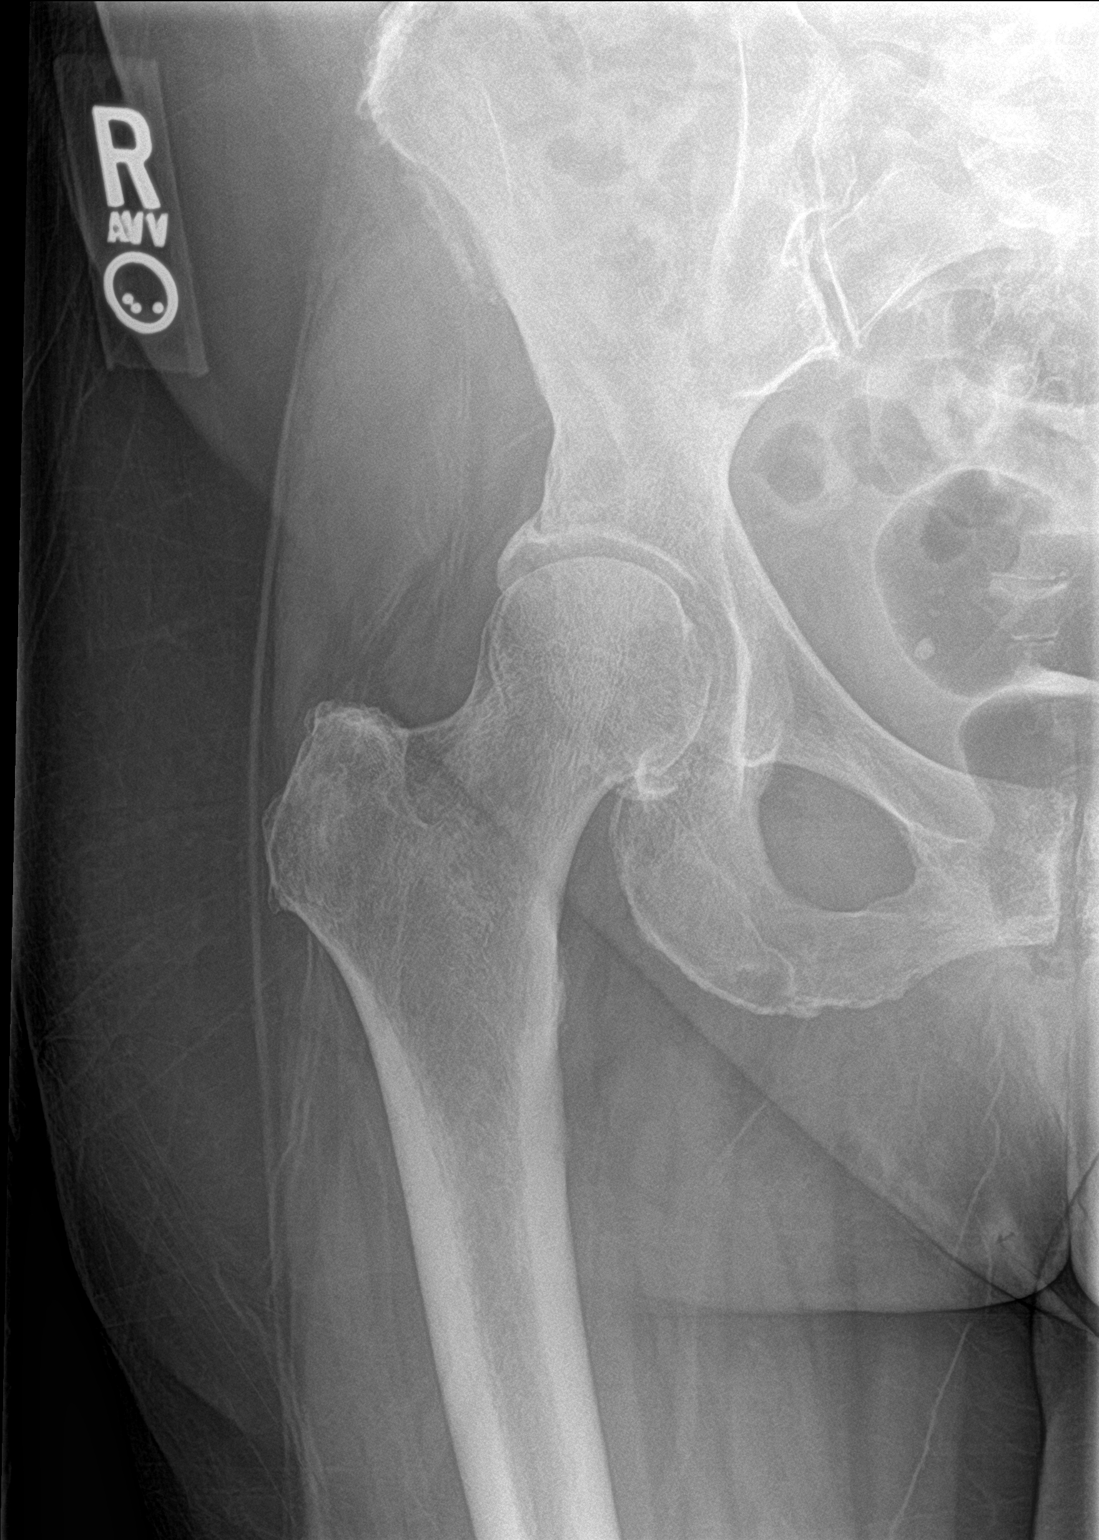

[hip lat]
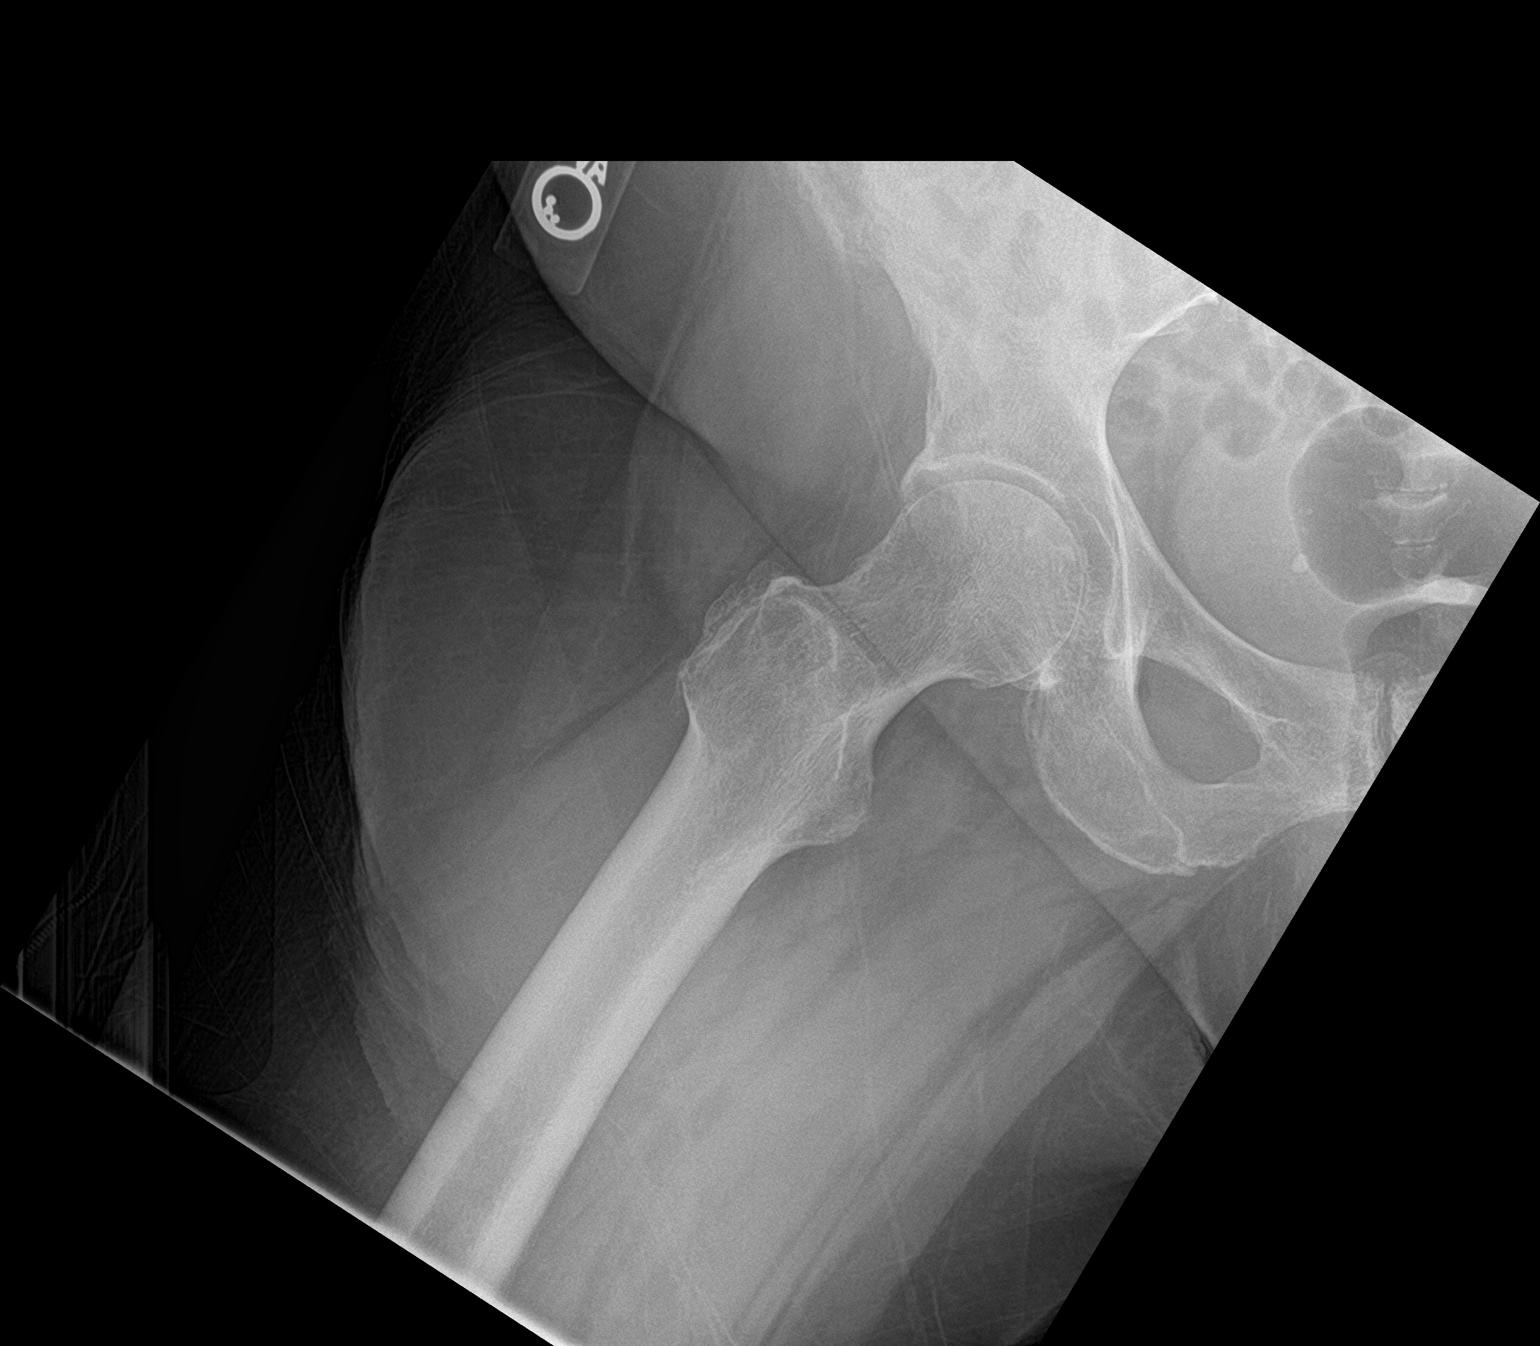

[3 of 3 positions shown; findings below may reference images not displayed]

FINDINGS: There is no evidence of hip fracture or dislocation. Mild
osteoarthritic changes in the hip joint.
IMPRESSION: No acute fracture or dislocation identified about the right hip.

## 2021-12-23 IMAGING — DX DG CHEST 2V
2 series · 2 of 2 positions shown · non-contrast
Comparison: [DATE]

CLINICAL DATA: Status post fall.

EXAM:
CHEST - 2 VIEW

[chest lat]
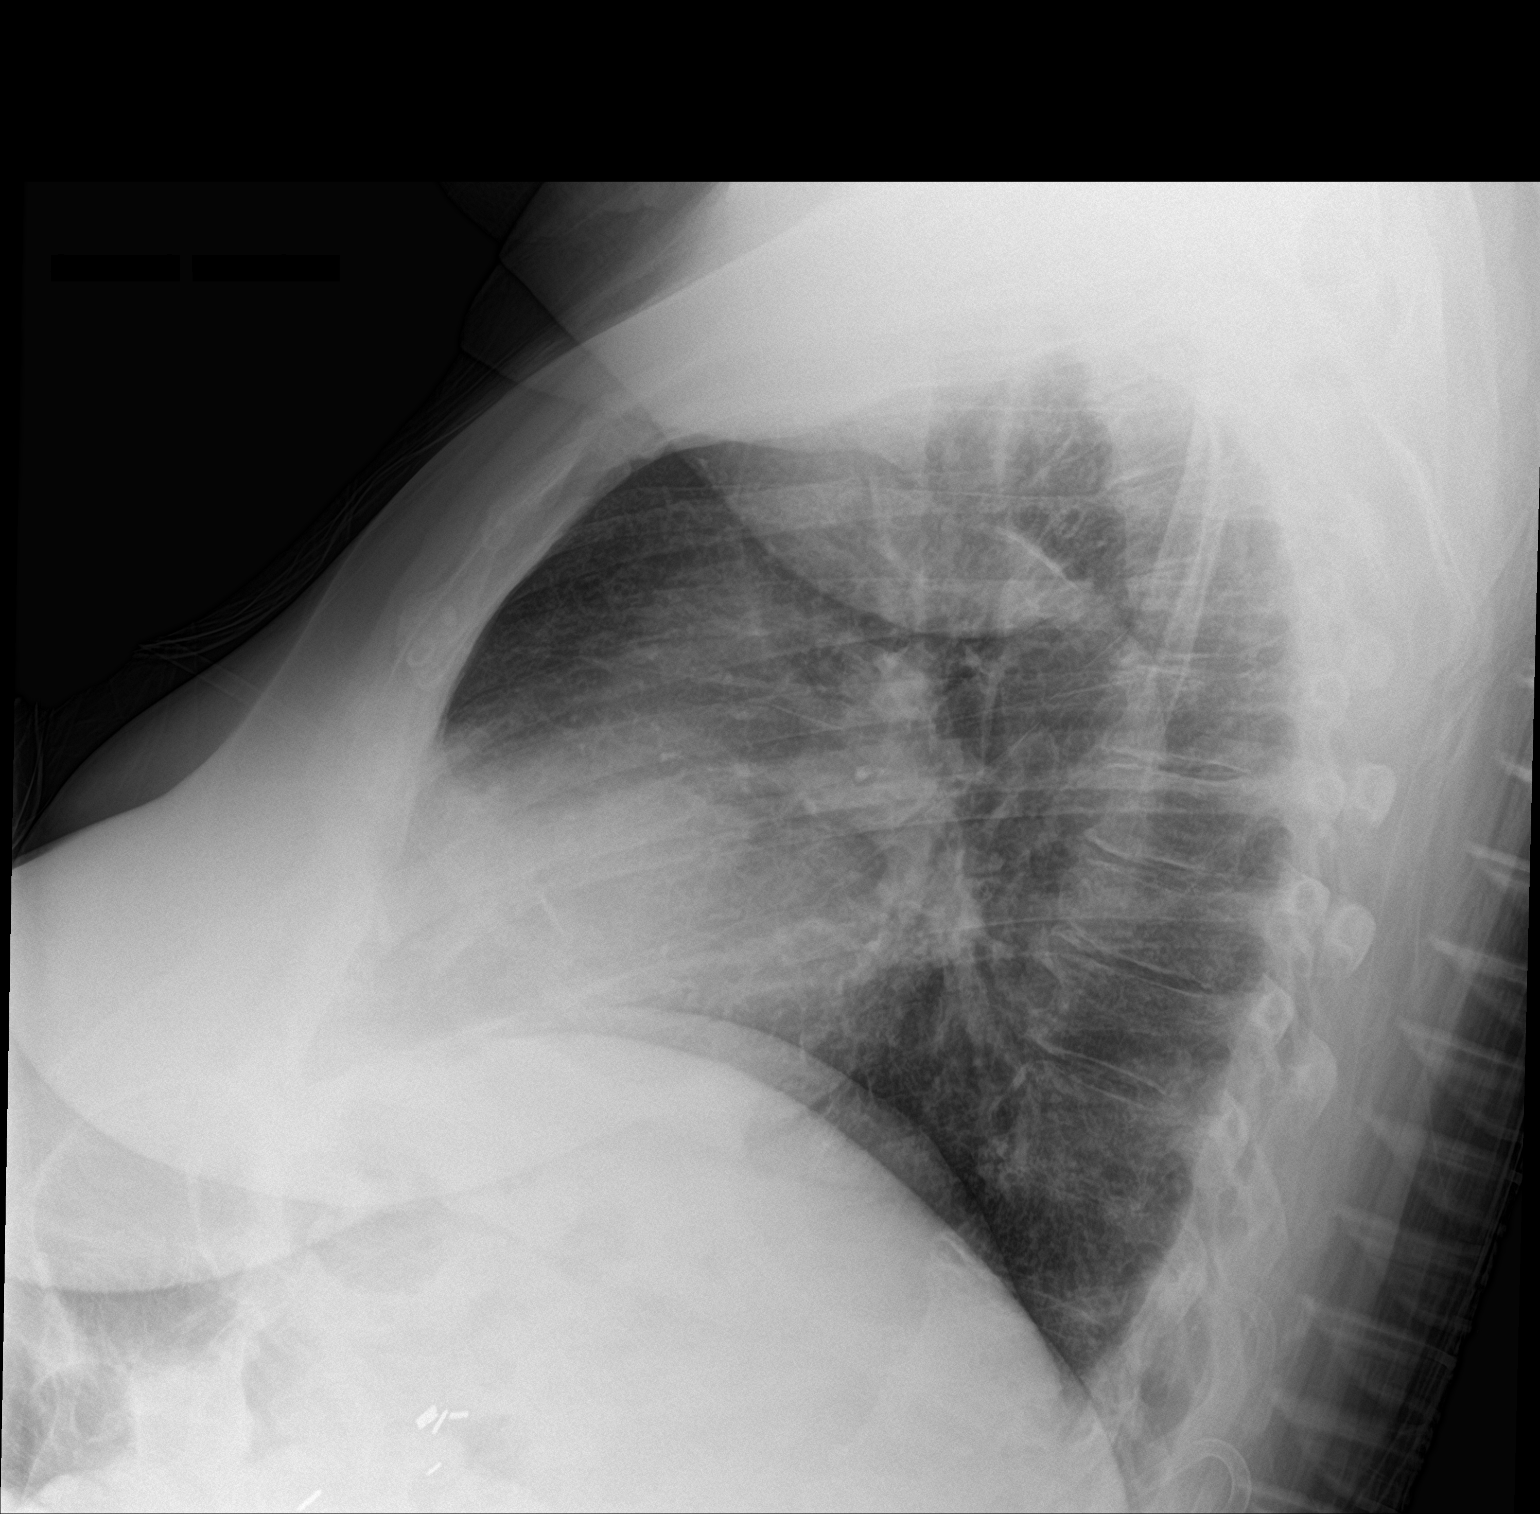

[chest ap]
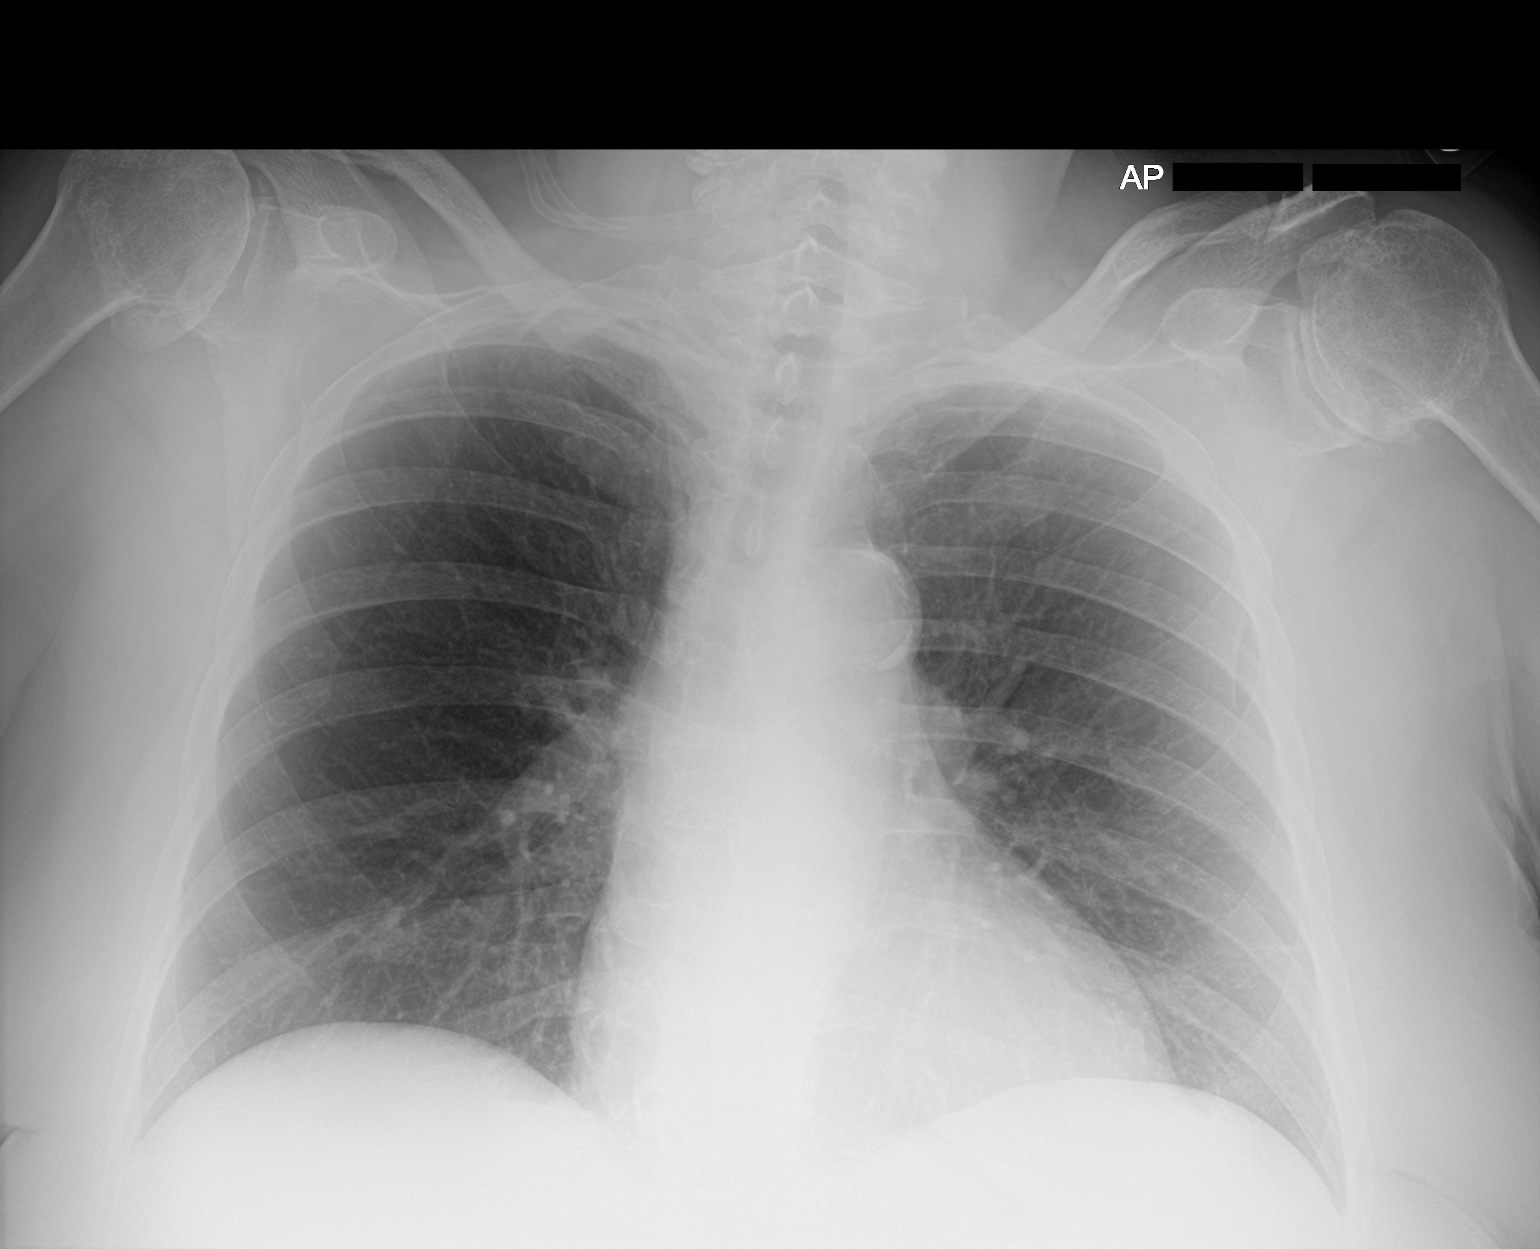

[2 of 2 positions shown; findings below may reference images not displayed]

FINDINGS: Calcific atherosclerotic disease and tortuosity of the aorta.

Cardiomediastinal silhouette is normal. Mediastinal contours appear
intact.

There is no evidence of focal airspace consolidation, pleural
effusion or pneumothorax.

Osseous structures are without acute abnormality. Soft tissues are
grossly normal.
IMPRESSION: No active cardiopulmonary disease.

## 2021-12-23 IMAGING — CT CT HEAD W/O CM
3 of 5 series · 15 of 47 positions shown, 18 images · non-contrast
Comparison: [DATE]

CLINICAL DATA: Trauma, moderate to severe.



[Series 3: head 2.0 h70h · axial · 0.46mm/px · z∈[-151,-5]mm · 9 of 83 slices shown, 12 images]
[im 5/83  brain]
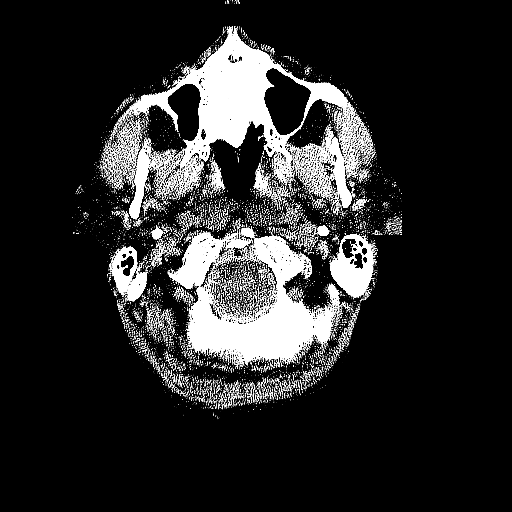
[im 5/83  bone]
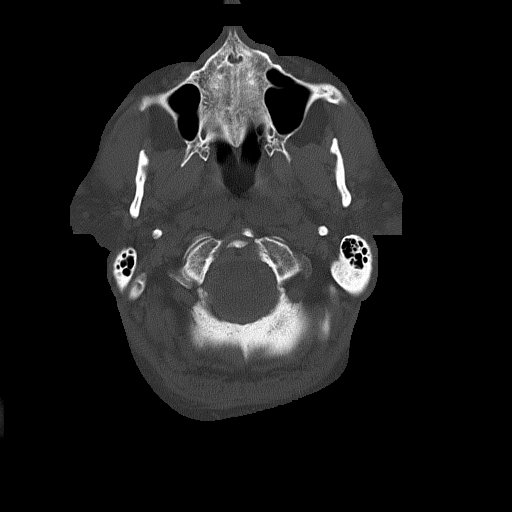
[im 15/83  brain]
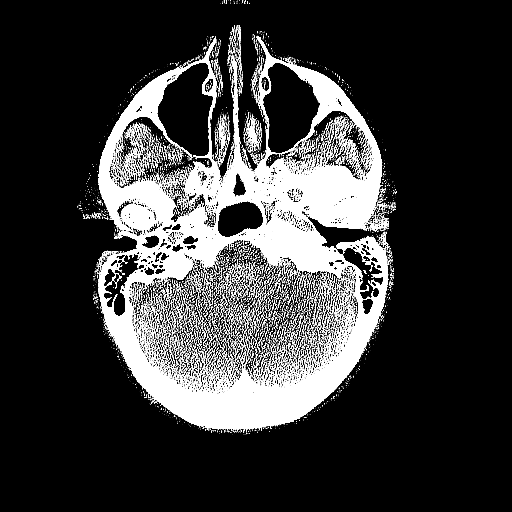
[im 25/83  brain]
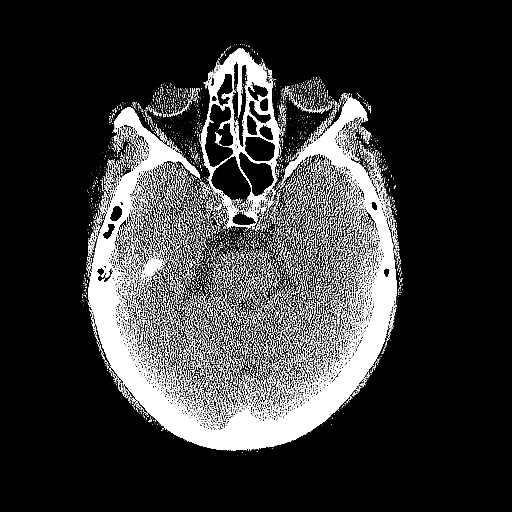
[im 34/83  brain]
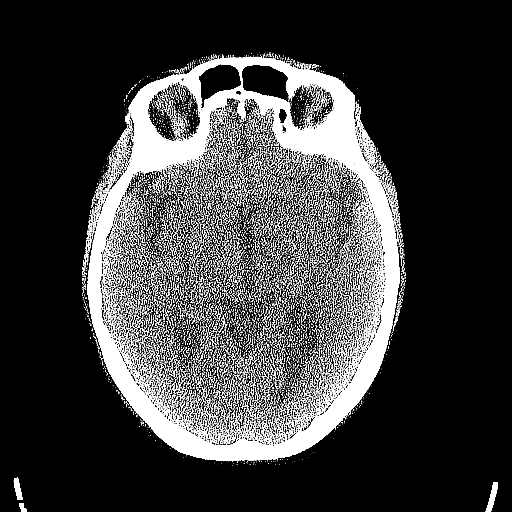
[im 44/83  brain]
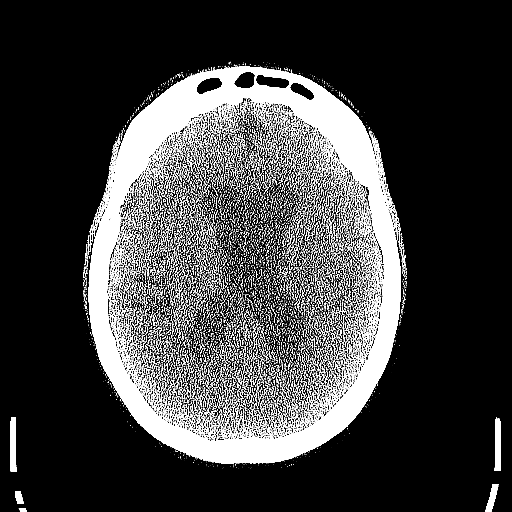
[im 44/83  bone]
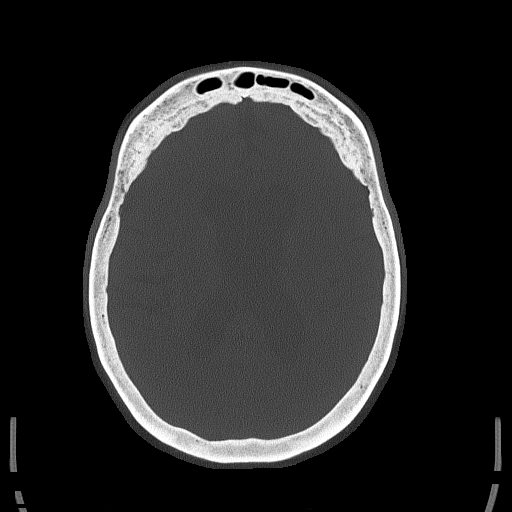
[im 49/83  brain]
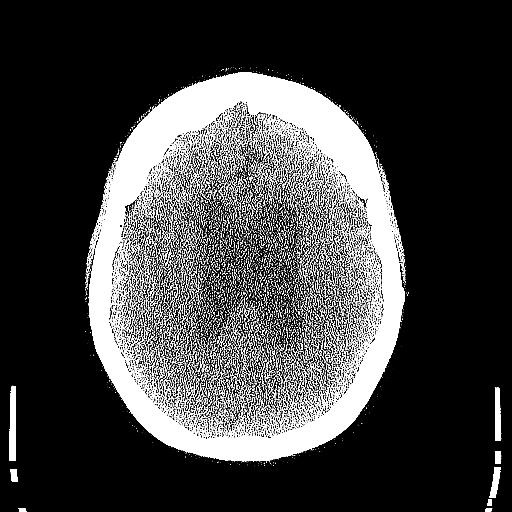
[im 58/83  brain]
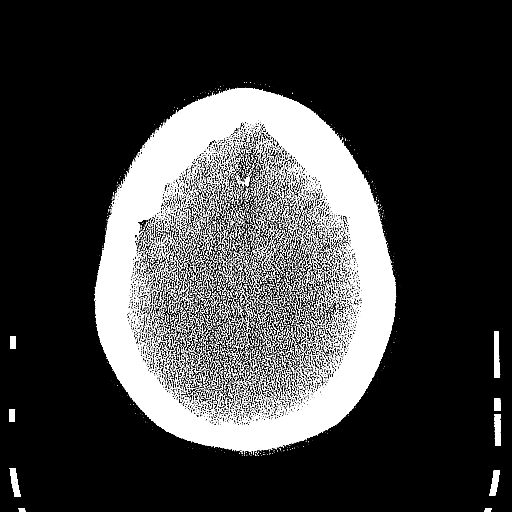
[im 68/83  brain]
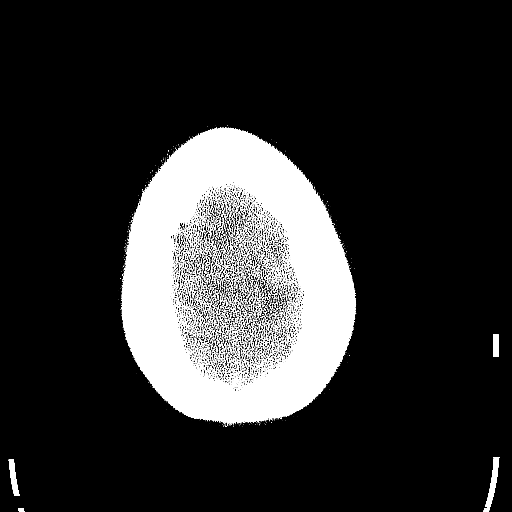
[im 78/83  brain]
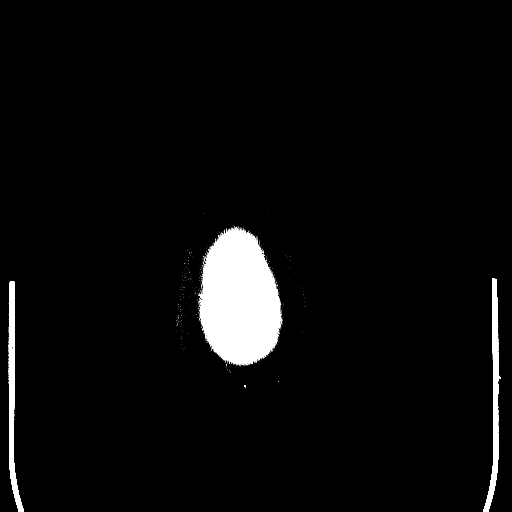
[im 78/83  bone]
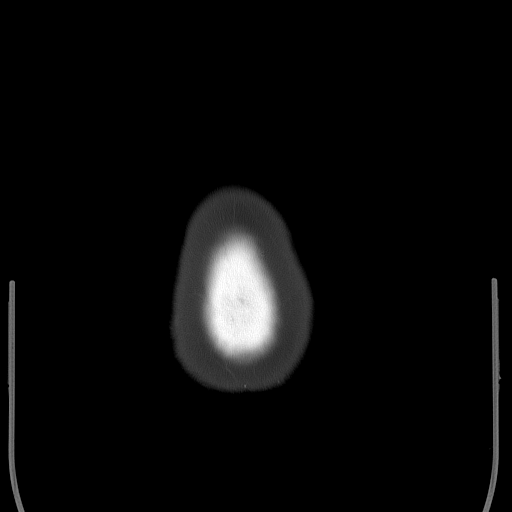

[Series 6: head 3.0 mpr cor · coronal · 0.34mm/px · 3 of 67 slices shown]
[im 24/67  brain]
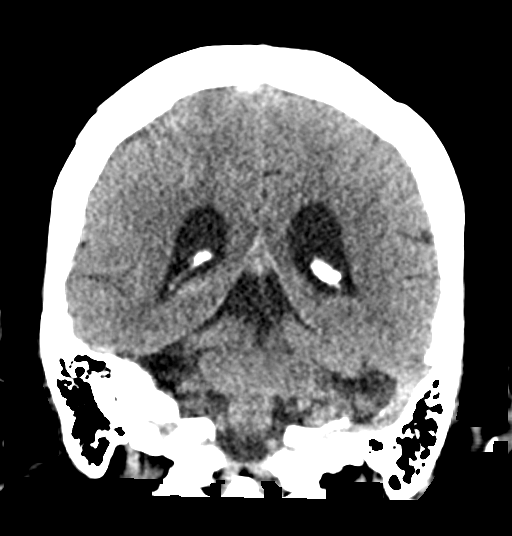
[im 30/67  brain]
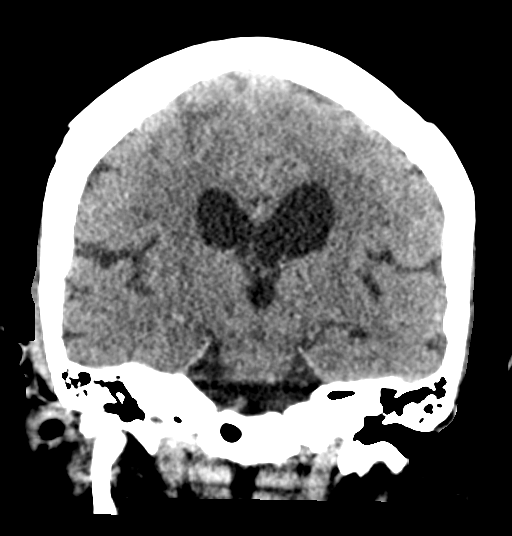
[im 37/67  brain]
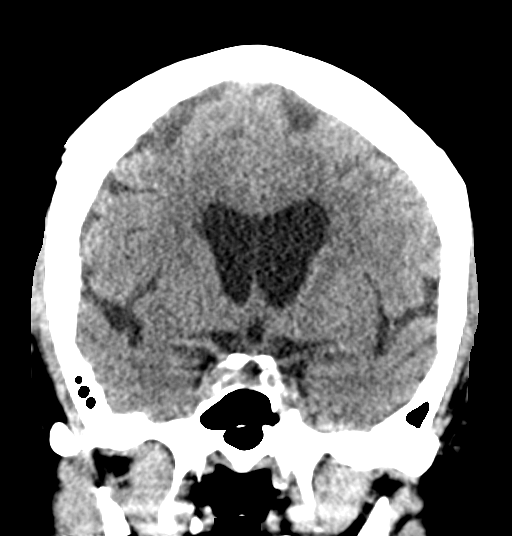

[Series 7: head 3.0 mpr sag · sagittal · 0.32mm/px · 3 of 57 slices shown]
[im 19/57  brain]
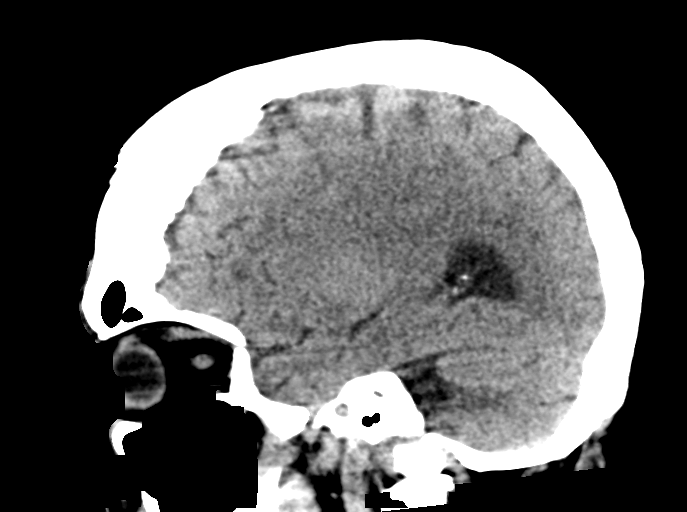
[im 29/57  brain]
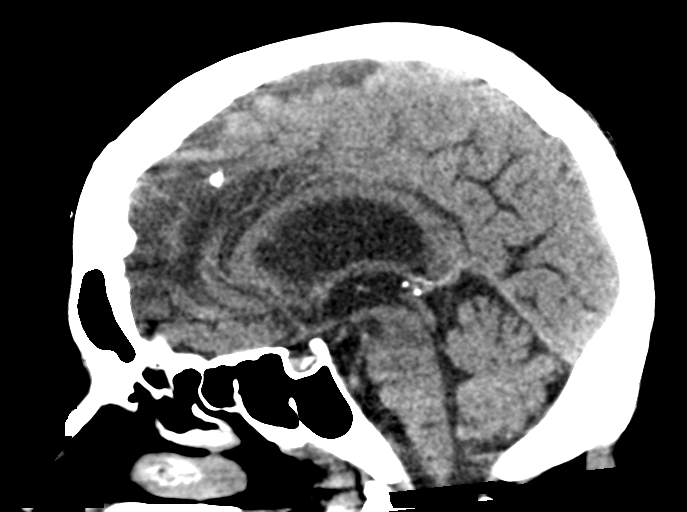
[im 38/57  brain]
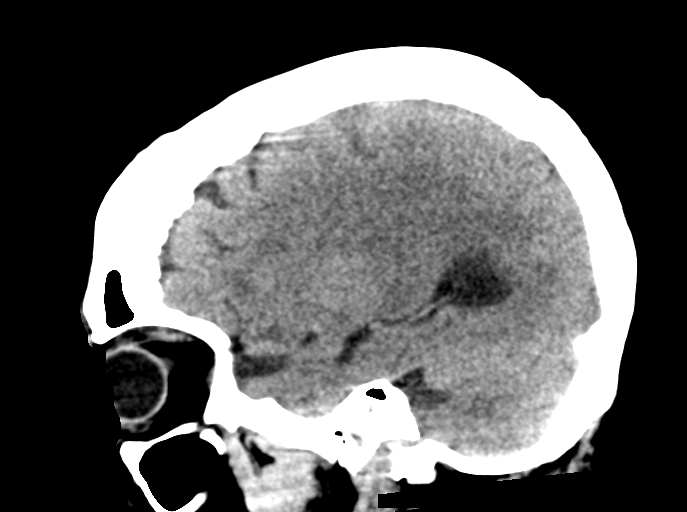

[15 of 47 positions shown; findings below may reference images not displayed]

FINDINGS: Brain: No evidence of acute infarction, hemorrhage, hydrocephalus,
extra-axial collection or mass lesion/mass effect.

Vascular: No hyperdense vessel or unexpected calcification.

Skull: Normal. Negative for fracture or focal lesion.

Sinuses/Orbits: No acute finding.

Other: None.
IMPRESSION: No acute intracranial abnormality.

## 2021-12-23 IMAGING — DX DG HIP (WITH OR WITHOUT PELVIS) 2-3V*L*
2 series · 2 of 2 positions shown · non-contrast
Comparison: None.

CLINICAL DATA: Status post fall.  Left hip pain.

EXAM:
DG HIP (WITH OR WITHOUT PELVIS) 2-3V LEFT

[hip ap]
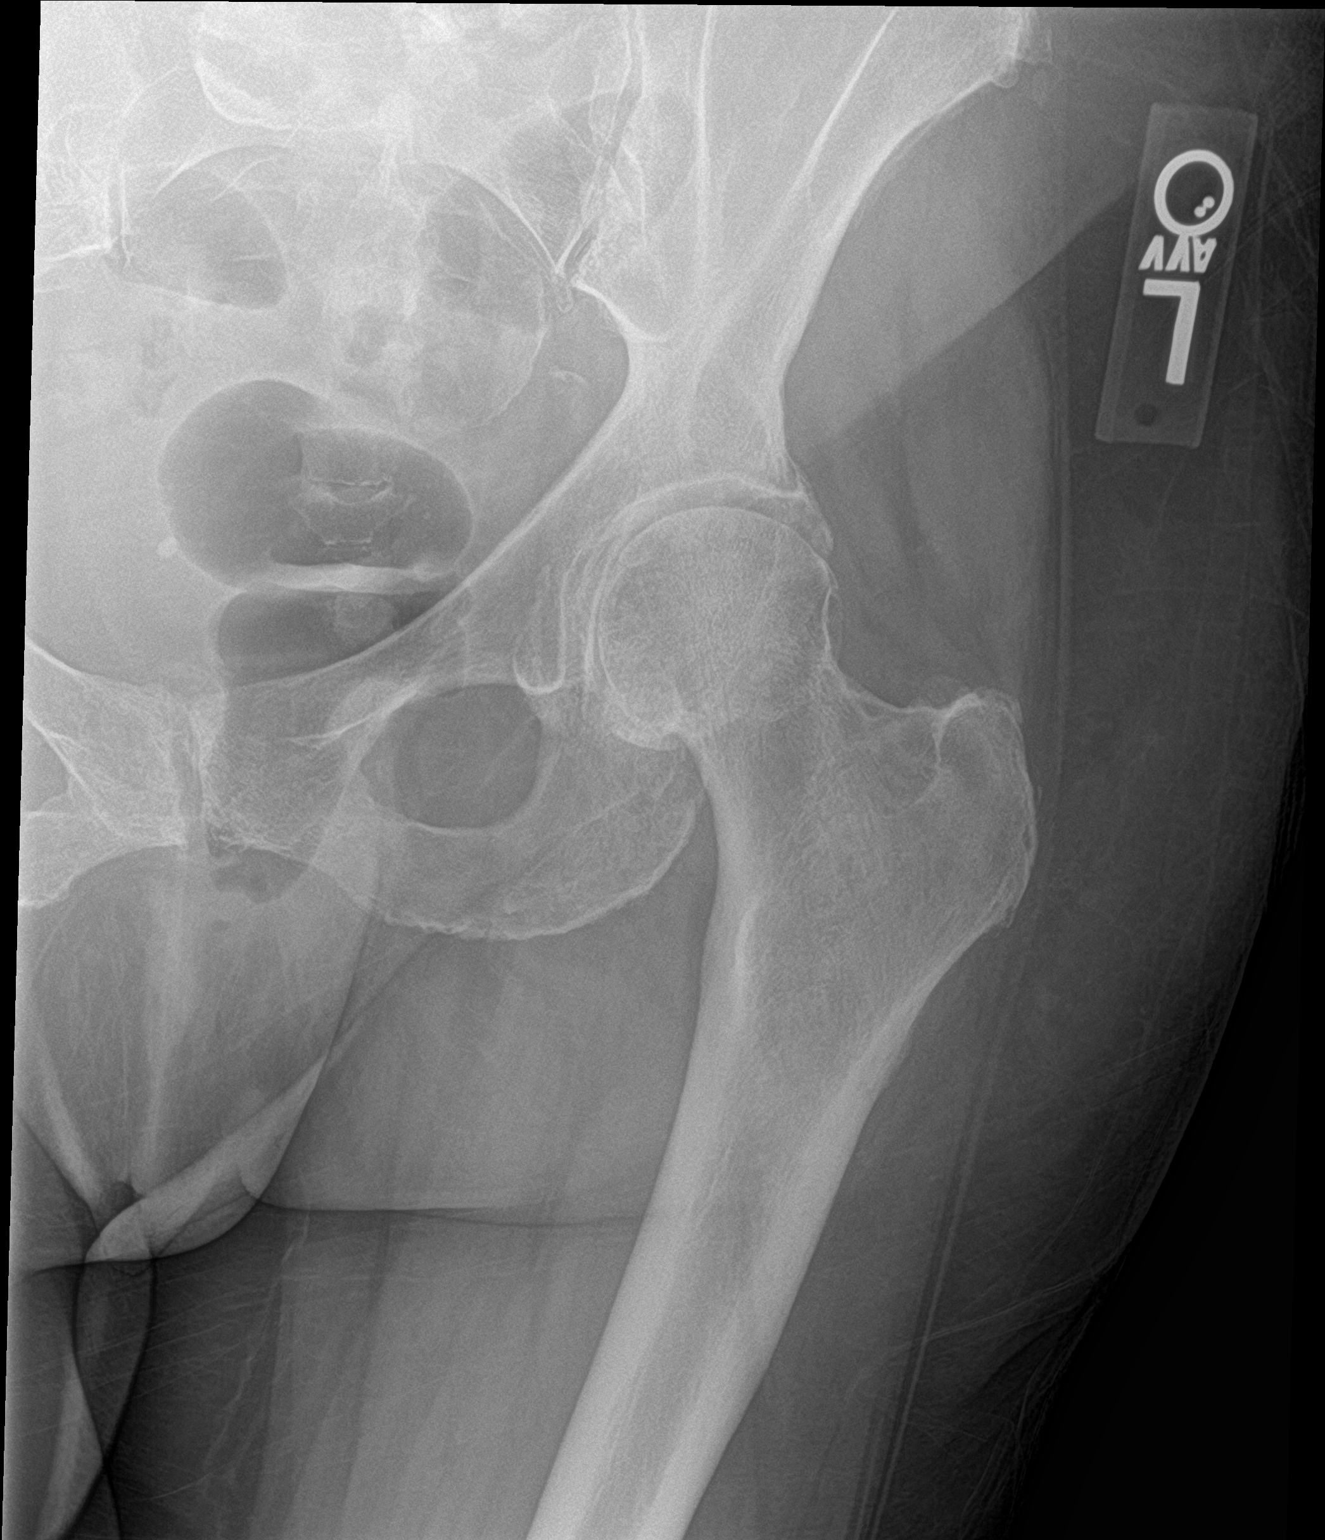

[hip lat]
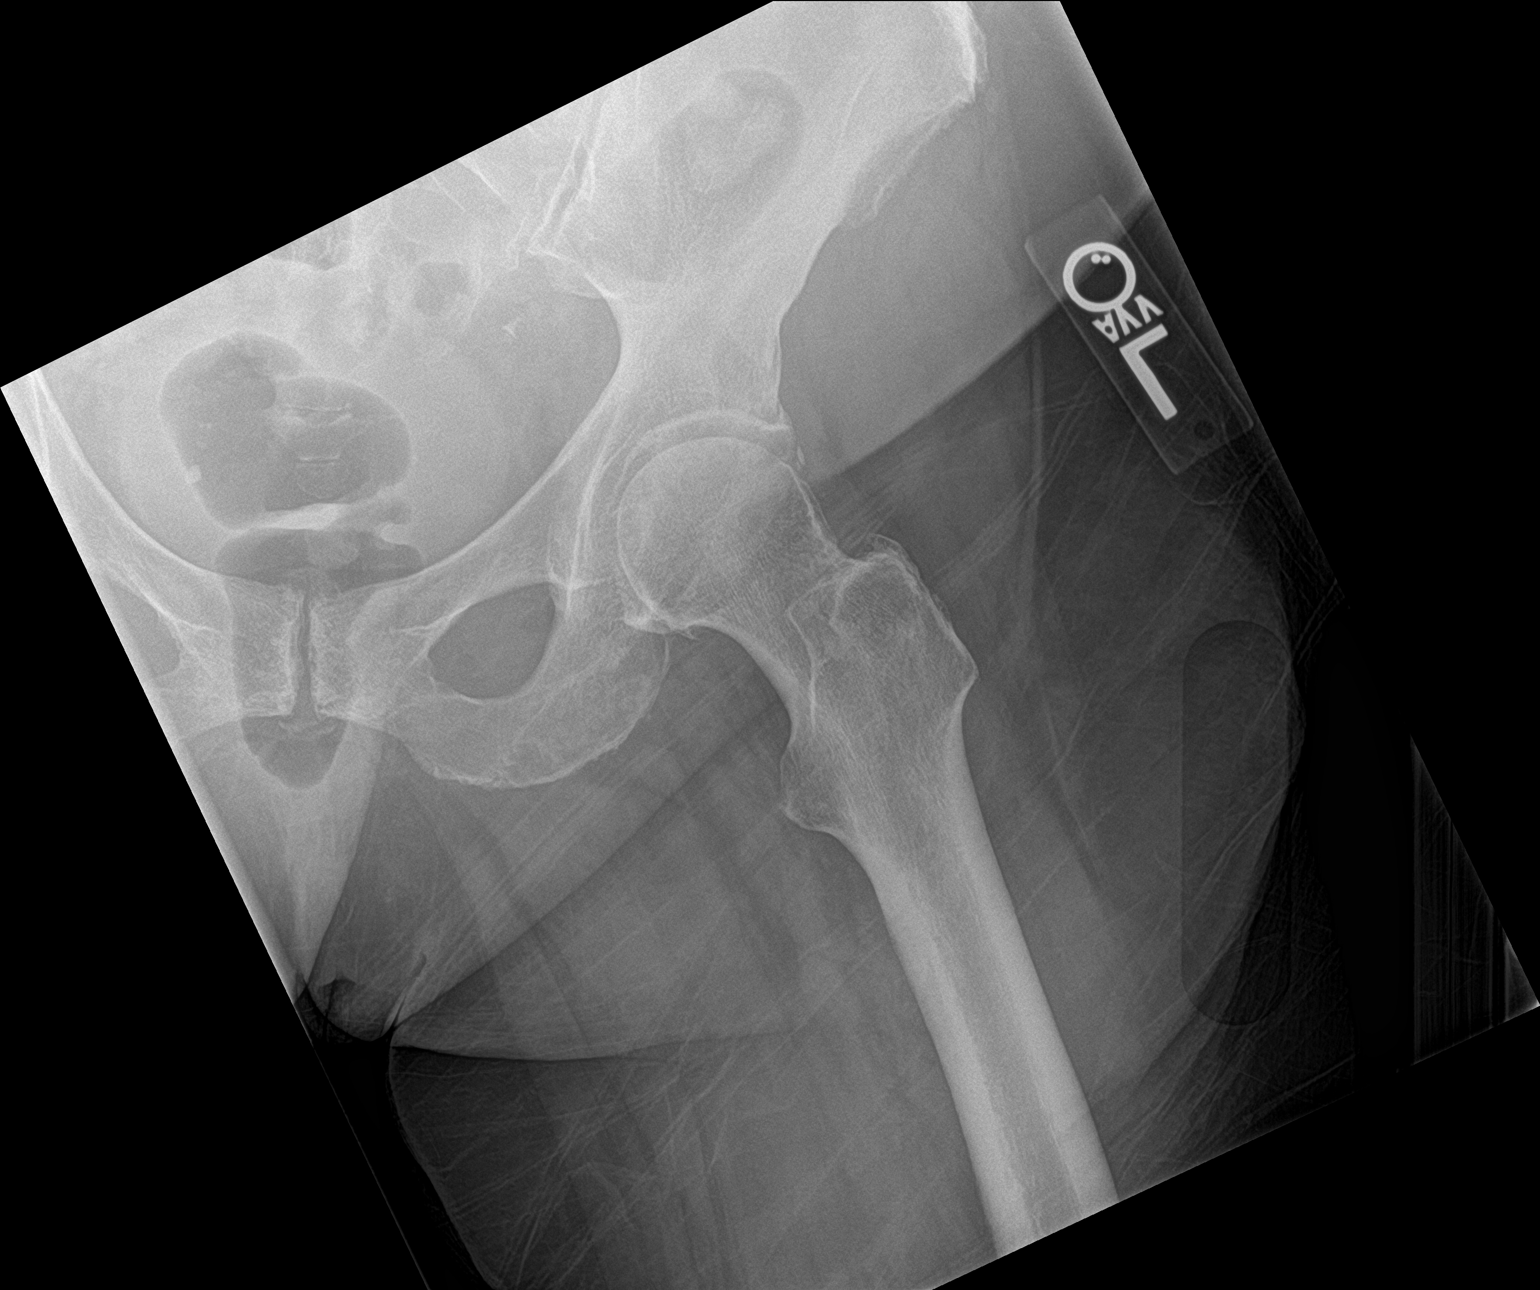

[2 of 2 positions shown; findings below may reference images not displayed]

FINDINGS: There is no evidence of hip fracture or dislocation. Mild
osteoarthritic changes of the left hip joint.
IMPRESSION: No acute fracture or dislocation identified about the left hip.

## 2021-12-23 IMAGING — DX DG SHOULDER 2+V*L*
3 series · 3 of 3 positions shown · non-contrast
Comparison: [DATE]

CLINICAL DATA: Trauma, fall

EXAM:
LEFT SHOULDER - 2+ VIEW

[shoulder grashey]
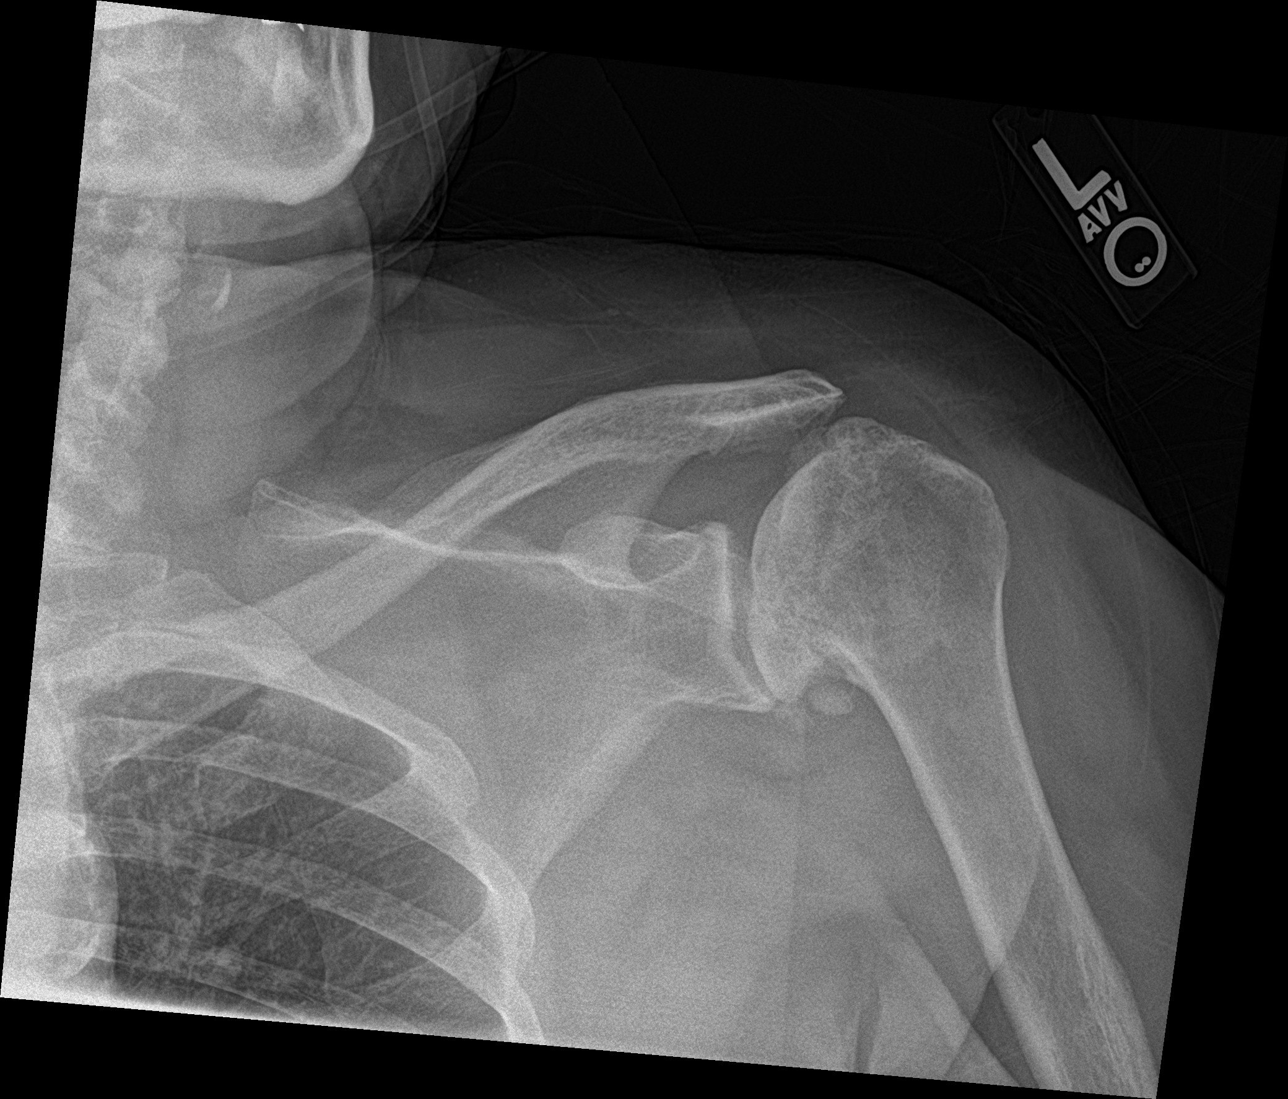

[shoulder y view]
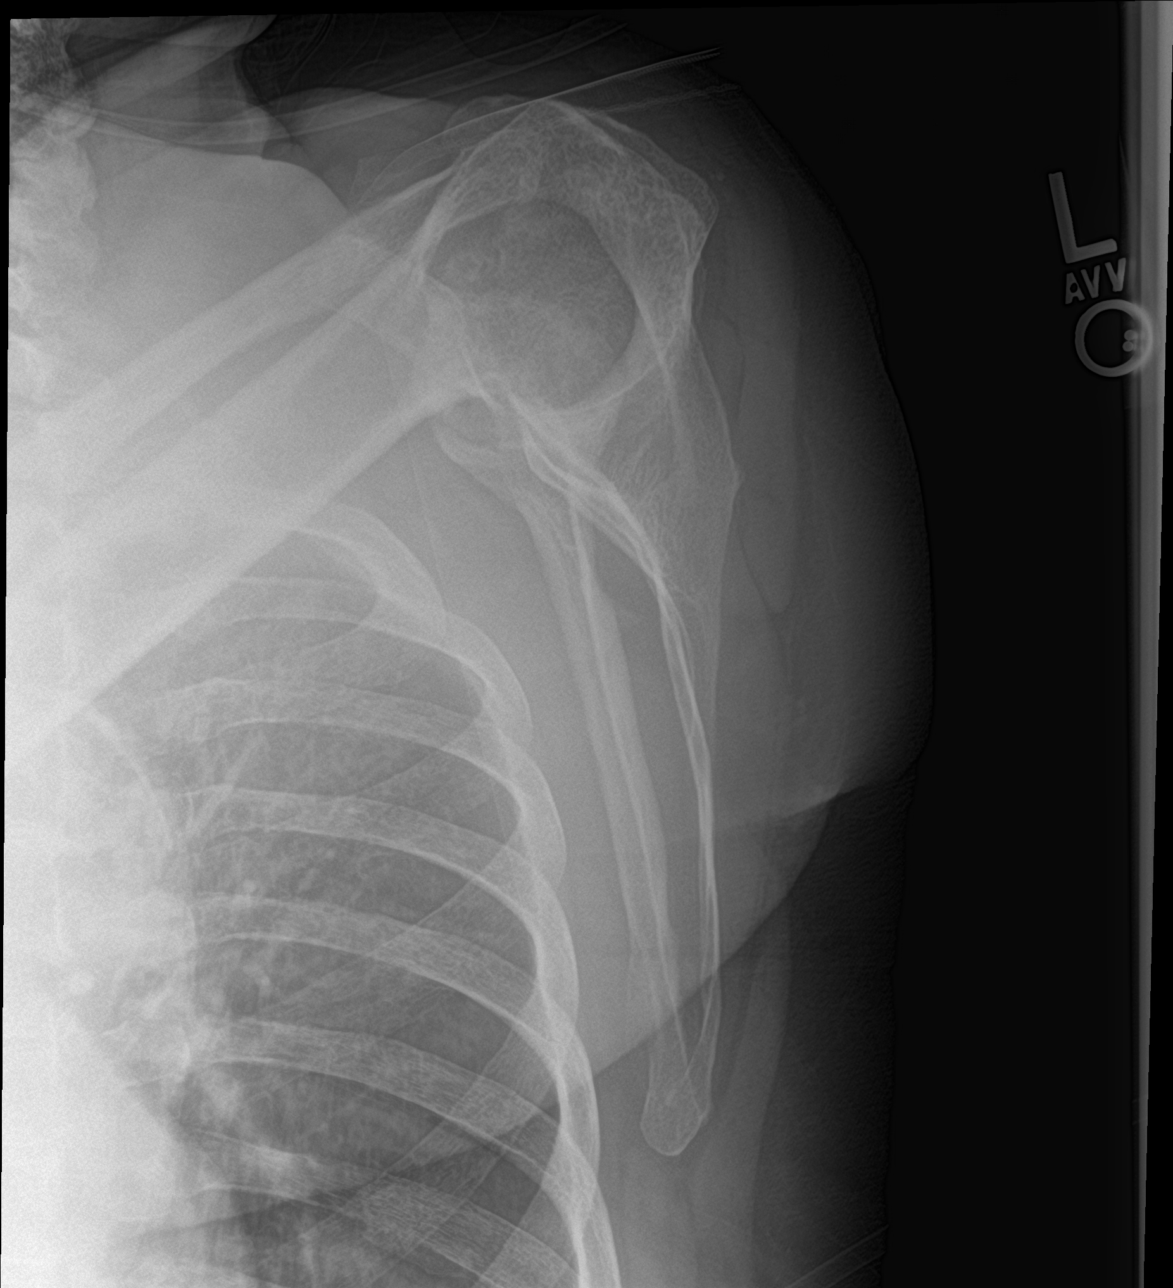

[shoulder ap neutral]
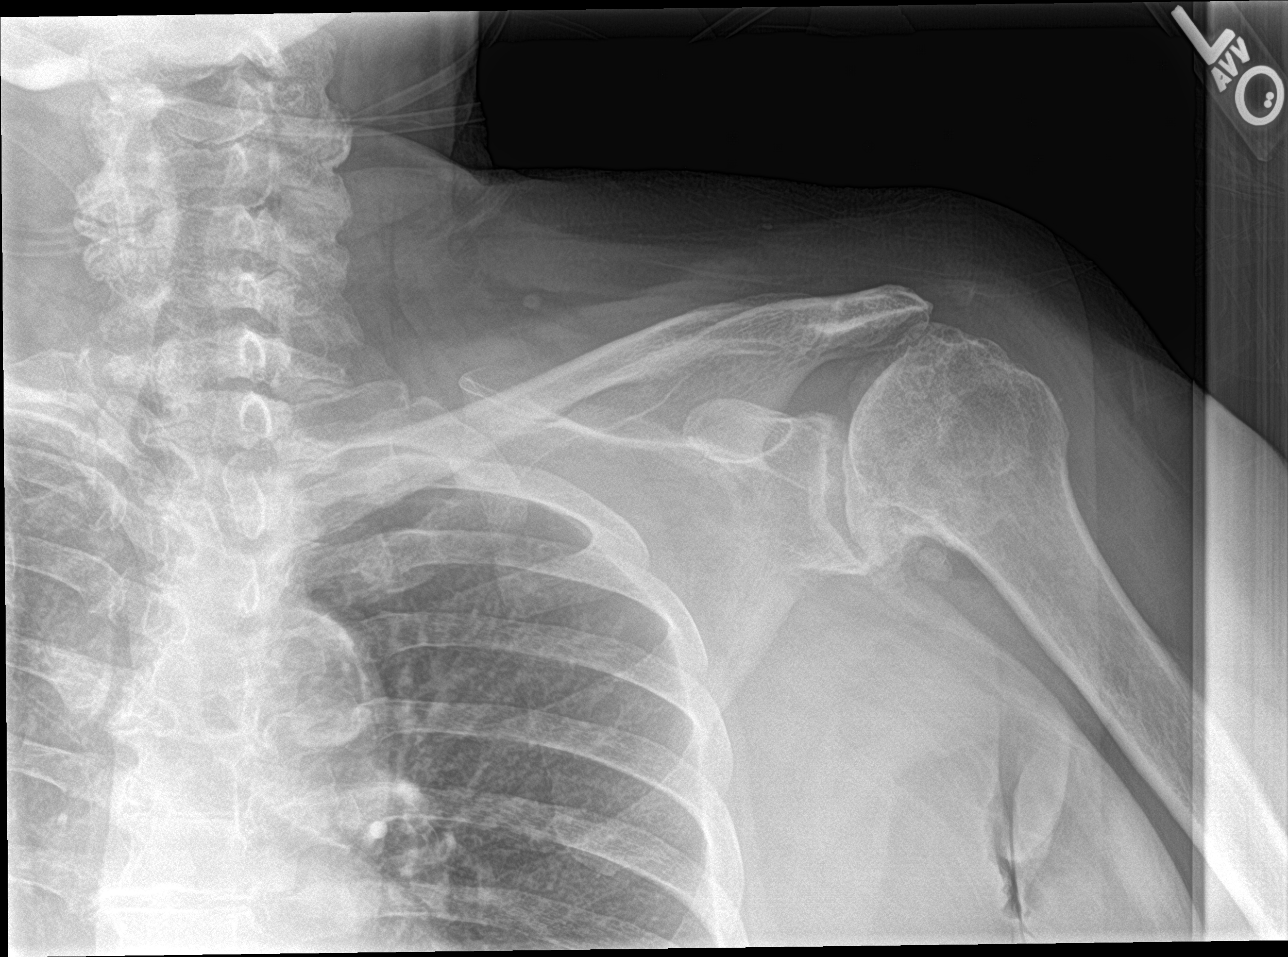

[3 of 3 positions shown; findings below may reference images not displayed]

FINDINGS: No recent fracture or dislocation is seen. Degenerative changes are
noted with bony spurs. There is decreased distance between acromion
and proximal humerus, possibly suggesting chronic tear of rotator
cuff. There are few smooth marginated calcifications adjacent to the
inferior aspect of glenoid. Mild lobulations are seen in the
superolateral margin of head of the left humerus.
IMPRESSION: No recent fracture or dislocation is seen. Degenerative changes are
noted with bony spurs and possible loose bodies in the left
shoulder. Lobulated cortical margin in the superolateral aspect of
head of the left humerus may be residual from previous injury.
Possible chronic tear of rotator cuff.

## 2021-12-23 MED ORDER — NIRMATRELVIR/RITONAVIR (PAXLOVID)TABLET: 3.0000 | ORAL_TABLET | Freq: Two times a day (BID) | ORAL | 0 refills | Status: AC

## 2021-12-23 MED ORDER — ACETAMINOPHEN 325 MG PO TABS
650.0000 mg | ORAL_TABLET | Freq: Four times a day (QID) | ORAL | Status: DC | PRN
  Administered 2021-12-23: 650 mg via ORAL
  Filled 2021-12-23: qty 2

## 2021-12-23 NOTE — ED Provider Notes (Signed)
[facility] EMERGENCY DEPARTMENT Provider Note   CSN: [id number] Arrival date & time: 12/23/21  1234     History  Chief Complaint  Patient presents with   Alexandra Wolfe    Alexandra Wolfe is a 76 y.o. female.  Pt reports increased weakness at home.  Pt reports she fell last pm and could not get up.  Pt reports she has had a headache, sore throat and cough.  Pt reports she has chronic knee pain and has difficulty getting around but is weaker than usual.  Pt reports she tried to get up but was in the floor until this am when her son came over.  Pt lives alone.  Pt complains of pain in both hips, both knees and left shoulder.  Pt complains of a headache.  Pt unsure if she struck her head   The history is provided by the patient. No language interpreter was used.  Fall This is a new problem. The current episode started 12 to 24 hours ago. The problem occurs constantly. The problem has not changed since onset.Associated symptoms include headaches. Nothing aggravates the symptoms. Nothing relieves the symptoms. She has tried nothing for the symptoms. The treatment provided no relief.      Home Medications Prior to Admission medications   Medication Sig Start Date End Date Taking? Authorizing Provider  acetaminophen (TYLENOL) 325 MG tablet Take 2 tablets (650 mg total) by mouth every 6 (six) hours as needed for mild pain or headache. 03/02/20   Geradine Girt, DO  albuterol (VENTOLIN HFA) 108 (90 Base) MCG/ACT inhaler INHALE 2 PUFFS INTO THE LUNGS EVERY 6 HOURS AS NEEDED FOR WHEEZING OR SHORTNESS OF BREATH 09/19/20   Biagio Borg, MD  aspirin 81 MG EC tablet Take by mouth.    [provider]  atorvastatin (LIPITOR) 80 MG tablet TAKE 1 TABLET(80 MG) BY MOUTH DAILY 12/17/21   Biagio Borg, MD  budesonide-formoterol [facility]) 160-4.5 MCG/ACT inhaler Inhale 2 puffs into the lungs 2 (two) times daily. 06/15/20   Biagio Borg, MD  Calcium Carbonate (CALCIUM 500 PO) Take 1,000  mg by mouth 2 (two) times daily.     [provider]  cetirizine (ZYRTEC) 10 MG tablet Take 1 tablet (10 mg total) by mouth daily. 04/25/21 04/25/22  Biagio Borg, MD  diclofenac (VOLTAREN) 75 MG EC tablet TAKE 1 TABLET(75 MG) BY MOUTH TWICE DAILY 12/17/21   Biagio Borg, MD  ergocalciferol (VITAMIN D2) 1.25 MG (50000 UT) capsule Take 1 tablet by mouth daily.    [provider]  fluticasone (FLONASE) 50 MCG/ACT nasal spray SHAKE LIQUID AND USE 2 SPRAYS IN [facility] NOSTRIL DAILY 12/11/20   Biagio Borg, MD  glucose blood [facility] ULTRA) test strip Use as instructed three times daily E11.9 03/29/21   Biagio Borg, MD  levETIRAcetam (KEPPRA) 500 MG tablet Take 1 tablet (500 mg total) by mouth 2 (two) times daily. 04/06/20   Biagio Borg, MD  levothyroxine (SYNTHROID) 50 MCG tablet TAKE 1 TABLET(50 MCG) BY MOUTH DAILY 12/04/21   Biagio Borg, MD  meclizine (ANTIVERT) 25 MG tablet Take 1 tablet (25 mg total) by mouth 3 (three) times daily as needed for dizziness. 03/30/20   Medina-Vargas, Monina C, NP  meloxicam (MOBIC) 15 MG tablet TAKE 1 TABLET(15 MG) BY MOUTH DAILY 09/24/21   Edrick Kins, DPM  metFORMIN (GLUCOPHAGE) 500 MG tablet Take 1 tablet (500 mg total) by mouth 2 (two) times daily  with a meal. 04/20/21   Biagio Borg, MD  metoprolol succinate (TOPROL-XL) 25 MG 24 hr tablet TAKE 1 TABLET(25 MG) BY MOUTH DAILY 12/17/21   Biagio Borg, MD  Multiple Vitamin (MULTIVITAMIN WITH MINERALS) TABS tablet Take 1 tablet by mouth daily. 03/02/20   Geradine Girt, DO  MYRBETRIQ 50 MG TB24 tablet Take 1 tablet (50 mg total) by mouth at bedtime. 03/30/20   Medina-Vargas, Monina C, NP  NON FORMULARY Heart healthy [facility] diet    [provider]  NP THYROID 30 MG tablet Take 1 tablet (30 mg total) by mouth daily. 03/30/20   Medina-Vargas, Monina C, NP  ondansetron (ZOFRAN) 4 MG tablet Take 1 tablet (4 mg total) by mouth every 8 (eight) hours as needed for nausea or vomiting. 07/06/20   Biagio Borg, MD  oxybutynin (DITROPAN-XL) 10 MG 24 hr tablet Take by mouth.    [provider]  pantoprazole (PROTONIX) 40 MG tablet Take 1 tablet (40 mg total) by mouth daily. 12/14/21   Biagio Borg, MD  phentermine 30 MG capsule Take 1 capsule (30 mg total) by mouth every morning. 01/12/21   Biagio Borg, MD  polyethylene glycol powder [facility]) 17 GM/SCOOP powder Take 17 g by mouth 2 (two) times daily as needed. 12/14/21   Biagio Borg, MD  senna-docusate (SENOKOT-S) 8.6-50 MG tablet Take 1 tablet by mouth at bedtime as needed for mild constipation. 03/16/20   Hosie Poisson, MD  traZODone (DESYREL) 50 MG tablet TAKE 1 TABLET(50 MG) BY MOUTH AT BEDTIME 12/14/21   Biagio Borg, MD  triamcinolone cream (KENALOG) 0.1 % Apply 1 application topically 2 (two) times daily. 03/21/21 03/21/22  Biagio Borg, MD      Allergies    Aripiprazole and Simvastatin    Review of Systems   Review of Systems  HENT:  Positive for sore throat.   Musculoskeletal:  Positive for arthralgias.  Neurological:  Positive for headaches.  All other systems reviewed and are negative.  Physical Exam Updated Vital Signs BP (!) 149/89 (BP Location: Left Arm)    Pulse 74    Temp (!) 97.4 F (36.3 C) (Oral)    Resp (!) 30    Ht 5\' 6"  (1.676 m)    Wt 104.3 kg    SpO2 100%    BMI 37.12 kg/m  Physical Exam Vitals reviewed.  Constitutional:      Appearance: Normal appearance.  HENT:     Head: Normocephalic and atraumatic.     Nose: Nose normal.     Mouth/Throat:     Mouth: Mucous membranes are moist.  Eyes:     Pupils: Pupils are equal, round, and reactive to light.  Cardiovascular:     Rate and Rhythm: Normal rate and regular rhythm.  Pulmonary:     Effort: Pulmonary effort is normal.  Abdominal:     General: Abdomen is flat.  Musculoskeletal:        General: Tenderness present.     Cervical back: Normal range of motion.  Skin:    General: Skin is warm and dry.  Neurological:     General: No focal deficit  present.     Mental Status: She is alert.  Psychiatric:        Mood and Affect: Mood normal.        Behavior: Behavior normal.    ED Results / Procedures / Treatments   Labs (all labs ordered are listed, but only  abnormal results are displayed) Labs Reviewed  RESP PANEL BY RT-PCR (FLU A&B, COVID) ARPGX2 - Abnormal; Notable for the following components:      Result Value   SARS Coronavirus 2 by RT PCR POSITIVE (*)    All other components within normal limits  CBC WITH DIFFERENTIAL/PLATELET - Abnormal; Notable for the following components:   WBC 12.2 (*)    Neutro Abs 8.9 (*)    Monocytes Absolute 1.1 (*)    All other components within normal limits  COMPREHENSIVE METABOLIC PANEL - Abnormal; Notable for the following components:   CO2 21 (*)    Glucose, Bld 152 (*)    All other components within normal limits  CK - Abnormal; Notable for the following components:   Total CK 253 (*)    All other components within normal limits  URINALYSIS, ROUTINE W REFLEX MICROSCOPIC  TROPONIN I (HIGH SENSITIVITY)    EKG None  Radiology DG Chest 2 View  Result Date: 12/23/2021 CLINICAL DATA:  Status post fall. EXAM: CHEST - 2 VIEW COMPARISON:  November 10, 2021 FINDINGS: Calcific atherosclerotic disease and tortuosity of the aorta. Cardiomediastinal silhouette is normal. Mediastinal contours appear intact. There is no evidence of focal airspace consolidation, pleural effusion or pneumothorax. Osseous structures are without acute abnormality. Soft tissues are grossly normal. IMPRESSION: No active cardiopulmonary disease. Electronically Signed   By: Fidela Salisbury M.D.   On: 12/23/2021 13:41   CT Head Wo Contrast  Result Date: 12/23/2021 CLINICAL DATA:  Trauma, moderate to severe. EXAM: CT HEAD WITHOUT CONTRAST TECHNIQUE: Contiguous axial images were obtained from the base of the skull through the vertex without intravenous contrast. RADIATION DOSE REDUCTION: This exam was performed according  to the departmental dose-optimization program which includes automated exposure control, adjustment of the mA and/or kV according to patient size and/or use of iterative reconstruction technique. COMPARISON:  May 09, 2020 FINDINGS: Brain: No evidence of acute infarction, hemorrhage, hydrocephalus, extra-axial collection or mass lesion/mass effect. Vascular: No hyperdense vessel or unexpected calcification. Skull: Normal. Negative for fracture or focal lesion. Sinuses/Orbits: No acute finding. Other: None. IMPRESSION: No acute intracranial abnormality. Electronically Signed   By: Fidela Salisbury M.D.   On: 12/23/2021 13:48   DG Shoulder Left  Result Date: 12/23/2021 CLINICAL DATA:  Trauma, fall EXAM: LEFT SHOULDER - 2+ VIEW COMPARISON:  12/13/2011 FINDINGS: No recent fracture or dislocation is seen. Degenerative changes are noted with bony spurs. There is decreased distance between acromion and proximal humerus, possibly suggesting chronic tear of rotator cuff. There are few smooth marginated calcifications adjacent to the inferior aspect of glenoid. Mild lobulations are seen in the superolateral margin of head of the left humerus. IMPRESSION: No recent fracture or dislocation is seen. Degenerative changes are noted with bony spurs and possible loose bodies in the left shoulder. Lobulated cortical margin in the superolateral aspect of head of the left humerus may be residual from previous injury. 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 Electronically Signed   By: Elmer Picker M.D.   On: 12/23/2021 13:45   DG Knee Complete 4 Views Left  Result Date: 12/23/2021 CLINICAL DATA:  Status post fall. Abrasions to the knees and elbows. EXAM: LEFT KNEE - COMPLETE 4+ VIEW COMPARISON:  None. FINDINGS: No evidence of fracture, dislocation, or joint effusion. Chondrocalcinosis in mediolateral compartments. Moderate in severity arthritic changes. IMPRESSION: 1. No acute fracture or dislocation identified about the  left knee. 2. Chondrocalcinosis, usually associated with CPPD arthropathy. Electronically Signed   By: Thomas Hoff  Dimitrova M.D.   On: 12/23/2021 13:40   DG Knee Complete 4 Views Right  Result Date: 12/23/2021 CLINICAL DATA:  Status post fall.  Pain. EXAM: RIGHT KNEE - COMPLETE 4+ VIEW COMPARISON:  None. FINDINGS: No evidence of fracture, dislocation, or joint effusion. Chondrocalcinosis of the medial and lateral compartments. Arthritic changes in all 3 compartments, moderate. IMPRESSION: 1. No acute fracture or dislocation identified about the right knee. 2. 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 Electronically Signed   By: Fidela Salisbury M.D.   On: 12/23/2021 13:39   DG Hip Unilat W or Wo Pelvis 2-3 Views Left  Result Date: 12/23/2021 CLINICAL DATA:  Status post fall.  Left hip pain. EXAM: DG HIP (WITH OR WITHOUT PELVIS) 2-3V LEFT COMPARISON:  None. FINDINGS: There is no evidence of hip fracture or dislocation. 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 IMPRESSION: No acute fracture or dislocation identified about the left hip. Electronically Signed   By: Fidela Salisbury M.D.   On: 12/23/2021 13:43   DG Hip Unilat W or Wo Pelvis 2-3 Views Right  Result Date: 12/23/2021 CLINICAL DATA:  Status post fall.  Right hip pain. EXAM: DG HIP (WITH OR WITHOUT PELVIS) 2-3V RIGHT COMPARISON:  None. FINDINGS: There is no evidence of hip fracture or dislocation. Mild osteoarthritic changes in the hip joint. IMPRESSION: No acute fracture or dislocation identified about the right hip. Electronically Signed   By: Fidela Salisbury M.D.   On: 12/23/2021 13:42    Procedures Procedures    Medications Ordered in ED Medications - No data to display  ED Course/ Medical Decision Making/ A&P                           Medical Decision Making Amount and/or Complexity of Data Reviewed Independent Historian: EMS    Details: Son here with  pt External Data Reviewed: notes.    Details: Orthopaedist notes resolved Labs: ordered. Decision-making details documented in ED Course.    Details: Pt is covid positive Radiology: ordered and independent interpretation performed. Decision-making details documented in ED Course.    Details: bilat hips, knees and right shoulder show no evidence of fracture.  chest xray no pneumonia,  Ct head  no acute abnormality  Risk OTC drugs. Prescription drug management. Risk Details: Pt is covid positive.  Pt is a candidate for paxlovid.  Pt reports she can stay with her son.             Final Clinical Impression(s) / ED Diagnoses Final diagnoses:  Fall, initial encounter  COVID  Acute pain of both knees  Acute hip pain, bilateral  Acute pain of left shoulder    Rx / DC Orders ED Discharge Orders          Ordered    nirmatrelvir/ritonavir EUA (PAXLOVID) 20 x 150 MG & 10 x 100MG  TABS  2 times daily        12/23/21 1429          An After Visit Summary was printed and given to the patient.     Fransico Meadow, Hershal Coria 12/23/21 1505    Blanchie Dessert, MD 12/23/21 1541

## 2021-12-23 NOTE — Discharge Instructions (Signed)
Return if any problems.

## 2021-12-23 NOTE — ED Notes (Signed)
Abrasions noted to bilateral knees and bilateral elbows. Patient hyperventilating and oxygen is 100% advised patient to slow breathing down bc she is going to start cramping and going numb.

## 2021-12-23 NOTE — ED Triage Notes (Signed)
Patient from home bib ems after fall around midnight and landing on both knees and elbows.  Has been on the floor since until son found her.  Also complains of headache, sob, sore throat and weakness.

## 2021-12-25 ENCOUNTER — Telehealth: Payer: Self-pay | Admitting: Internal Medicine

## 2021-12-25 NOTE — Telephone Encounter (Signed)
Recv'd records from [facility] forwarded 3 pages to Dr. Cathlean Cower 1/17/23fbg

## 2022-01-01 ENCOUNTER — Other Ambulatory Visit: Payer: Self-pay | Admitting: Physician Assistant

## 2022-01-01 DIAGNOSIS — D72829 Elevated white blood cell count, unspecified: Secondary | ICD-10-CM

## 2022-01-01 NOTE — Progress Notes (Deleted)
[place] [phone]   [phone]  CONSULT NOTE  REFERRING PHYSICIAN: Cathlean Cower  REASON FOR CONSULTATION:  Leukocytosis   HPI Alexandra Wolfe is a 76 y.o. female with a past medical history significant for hypertension, obstructive sleep apnea, allergies, asthma, GERD, hypothyroidism, diabetes, degenerative joint disease, and hyperlipidemia is referred to the clinic for evaluation of leukocytosis.  The patient had a routine follow-up visit with her primary care provider on 12/14/2021.  The patient had routine blood work that day that showed leukocytosis with a white blood cell count of 12.2 and neutrophil slightly elevated at 8.9.  Of note, the patient was seen in the emergency room on 12/23/2021 for weakness and her lower extremities and fall.  The patient has chronic knee pain and it is challenging for her to get around but she was weaker than normal.  She is supposed to be seeing orthopedics upcoming.  While in the emergency room, the patient tested positive for COVID-19 on 12/23/2021.   Since being seen in the emergency room, she has been feeling ***.   The oldest records available to me ar from 14 years ago. The patient has been having fluctuating leukocytosis more often than not since that time. The highest WBC is 17.1.   Per chart review, it appears that the patient is prescribed Symbicort as well as Flonase.  Denies any prednisone use.  Besides the patient's recent COVID-19 infection, the patient denies any fever, chills night sweats, or unexplained weight loss.  Besides being diagnosed with COVID-19 recently, the patient denies frequent infections with sore throat, cough, diarrhea, skin infections, or dysuria.  Denies any lymphadenopathy.  Denies any nausea, vomiting, diarrhea, or constipation.  Denies any abdominal fullness or early satiety.  Denies any herbal supplements.  Denies any history of autoimmune disorders.  Denies any abnormal bleeding  or bruising.  Allergies?  Herbal supplements?    HPI  Past Medical History:  Diagnosis Date   ALLERGIC RHINITIS 04/02/2009   no per pt   Anxiety    BACK PAIN 09/27/2008   BUNIONS, BILATERAL 12/23/2007   CHEST DISCOMFORT, ATYPICAL 11/07/2009   Chronic LBP    COLONIC POLYPS, HX OF 09/13/2007   CONSTIPATION 09/27/2008   DEPRESSION 09/13/2007   Diabetes mellitus    diet controlled   DYSPNEA ON EXERTION 02/06/2010   Eustachian tube dysfunction 05/20/2011   FOOT PAIN, RIGHT 09/27/2008   HYPERLIPIDEMIA 03/11/2008   LBP (low back pain) 05/20/2011   Leukocytosis 11/20/2011   OSTEOARTHROSIS NOS, LOWER LEG 09/13/2007   SHOULDER PAIN, LEFT 02/14/2009   SVT (supraventricular tachycardia) ([place])    SYMPTOM, PALPITATIONS 09/13/2007   URINARY INCONTINENCE 08/23/2009   Vertigo 05/20/2011    Past Surgical History:  Procedure Laterality Date   BUNIONECTOMY     right   ccx     CHOLECYSTECTOMY     LUMBAR LAMINECTOMY     LUMBAR LAMINECTOMY     SHOULDER ARTHROSCOPY  12/13/2011   Procedure: ARTHROSCOPY SHOULDER;  Surgeon: Augustin Schooling;  Location: [facility];  Service: Orthopedics;  Laterality: Left;  Left Shoulder ArthroscopyDebridement Limited Tenodesis Open Rotator Cuff Repair Spur Removal Right Shoulder Injection     Family History  Problem Relation Age of Onset   Colon cancer Mother    Heart disease Father    Diabetes type II Father    Heart disease Brother    Diabetes Other        father   Esophageal cancer Neg Hx  Stomach cancer Neg Hx    Rectal cancer Neg Hx     Social History Social History   Tobacco Use   Smoking status: Former    Packs/day: 1.00    Years: 10.00    Pack years: 10.00    Types: Cigarettes   Smokeless tobacco: Never   Tobacco comments:    Smoked as a teenager. Quit in 1970  Substance Use Topics   Alcohol use: No    Alcohol/week: 0.0 standard drinks   Drug use: No    Allergies  Allergen Reactions   Aripiprazole     REACTION: agitation   Simvastatin      REACTION: myalgia    Current Outpatient Medications  Medication Sig Dispense Refill   acetaminophen (TYLENOL) 325 MG tablet Take 2 tablets (650 mg total) by mouth every 6 (six) hours as needed for mild pain or headache.     albuterol (VENTOLIN HFA) 108 (90 Base) MCG/ACT inhaler INHALE 2 PUFFS INTO THE LUNGS EVERY 6 HOURS AS NEEDED FOR WHEEZING OR SHORTNESS OF BREATH 8.5 g 7   aspirin 81 MG EC tablet Take by mouth.     atorvastatin (LIPITOR) 80 MG tablet TAKE 1 TABLET(80 MG) BY MOUTH DAILY 90 tablet 3   budesonide-formoterol (SYMBICORT) 160-4.5 MCG/ACT inhaler Inhale 2 puffs into the lungs 2 (two) times daily. 6 g 2   Calcium Carbonate (CALCIUM 500 PO) Take 1,000 mg by mouth 2 (two) times daily.      cetirizine (ZYRTEC) 10 MG tablet Take 1 tablet (10 mg total) by mouth daily. 90 tablet 3   ergocalciferol (VITAMIN D2) 1.25 MG (50000 UT) capsule Take 1 tablet by mouth daily.     fluticasone (FLONASE) 50 MCG/ACT nasal spray SHAKE LIQUID AND USE 2 SPRAYS IN EACH NOSTRIL DAILY 48 g 1   glucose blood (ONETOUCH ULTRA) test strip Use as instructed three times daily E11.9 300 each 12   levETIRAcetam (KEPPRA) 500 MG tablet Take 1 tablet (500 mg total) by mouth 2 (two) times daily. 180 tablet 1   levothyroxine (SYNTHROID) 50 MCG tablet TAKE 1 TABLET(50 MCG) BY MOUTH DAILY 30 tablet 0   meclizine (ANTIVERT) 25 MG tablet Take 1 tablet (25 mg total) by mouth 3 (three) times daily as needed for dizziness. 90 tablet 0   meloxicam (MOBIC) 15 MG tablet TAKE 1 TABLET(15 MG) BY MOUTH DAILY 90 tablet 0   metFORMIN (GLUCOPHAGE) 500 MG tablet Take 1 tablet (500 mg total) by mouth 2 (two) times daily with a meal. 180 tablet 3   metoprolol succinate (TOPROL-XL) 25 MG 24 hr tablet TAKE 1 TABLET(25 MG) BY MOUTH DAILY 90 tablet 3   Multiple Vitamin (MULTIVITAMIN WITH MINERALS) TABS tablet Take 1 tablet by mouth daily.     MYRBETRIQ 50 MG TB24 tablet Take 1 tablet (50 mg total) by mouth at bedtime. 30 tablet 0   NON  FORMULARY Heart healthy lcs diet     NP THYROID 30 MG tablet Take 1 tablet (30 mg total) by mouth daily. 30 tablet 0   ondansetron (ZOFRAN) 4 MG tablet Take 1 tablet (4 mg total) by mouth every 8 (eight) hours as needed for nausea or vomiting. 30 tablet 0   oxybutynin (DITROPAN-XL) 10 MG 24 hr tablet Take by mouth.     pantoprazole (PROTONIX) 40 MG tablet Take 1 tablet (40 mg total) by mouth daily. 90 tablet 3   phentermine 30 MG capsule Take 1 capsule (30 mg total) by mouth every morning. [place]  capsule 2   polyethylene glycol powder (GLYCOLAX/MIRALAX) 17 GM/SCOOP powder Take 17 g by mouth 2 (two) times daily as needed. 3350 g 3   senna-docusate (SENOKOT-S) 8.6-50 MG tablet Take 1 tablet by mouth at bedtime as needed for mild constipation.     traZODone (DESYREL) 50 MG tablet TAKE 1 TABLET(50 MG) BY MOUTH AT BEDTIME 90 tablet 1   triamcinolone cream (KENALOG) 0.1 % Apply 1 application topically 2 (two) times daily. 30 g 1   No current facility-administered medications for this visit.    REVIEW OF SYSTEMS:   Review of Systems  Constitutional: Negative for appetite change, chills, fatigue, fever and unexpected weight change.  HENT:   Negative for mouth sores, nosebleeds, sore throat and trouble swallowing.   Eyes: Negative for eye problems and icterus.  Respiratory: Negative for cough, hemoptysis, shortness of breath and wheezing.   Cardiovascular: Negative for chest pain and leg swelling.  Gastrointestinal: Negative for abdominal pain, constipation, diarrhea, nausea and vomiting.  Genitourinary: Negative for bladder incontinence, difficulty urinating, dysuria, frequency and hematuria.   Musculoskeletal: Negative for back pain, gait problem, neck pain and neck stiffness.  Skin: Negative for itching and rash.  Neurological: Negative for dizziness, extremity weakness, gait problem, headaches, light-headedness and seizures.  Hematological: Negative for adenopathy. Does not bruise/bleed easily.   Psychiatric/Behavioral: Negative for confusion, depression and sleep disturbance. The patient is not nervous/anxious.     PHYSICAL EXAMINATION:  There were no vitals taken for this visit.  ECOG PERFORMANCE STATUS: {CHL ONC ECOG [id number]  Physical Exam  Constitutional: Oriented to person, place, and time and well-developed, well-nourished, and in no distress. No distress.  HENT:  Head: Normocephalic and atraumatic.  Mouth/Throat: Oropharynx is clear and moist. No oropharyngeal exudate.  Eyes: Conjunctivae are normal. Right eye exhibits no discharge. Left eye exhibits no discharge. No scleral icterus.  Neck: Normal range of motion. Neck supple.  Cardiovascular: Normal rate, regular rhythm, normal heart sounds and intact distal pulses.   Pulmonary/Chest: Effort normal and breath sounds normal. No respiratory distress. No wheezes. No rales.  Abdominal: Soft. Bowel sounds are normal. Exhibits no distension and no mass. There is no tenderness.  Musculoskeletal: Normal range of motion. Exhibits no edema.  Lymphadenopathy:    No cervical adenopathy.  Neurological: Alert and oriented to person, place, and time. Exhibits normal muscle tone. Gait normal. Coordination normal.  Skin: Skin is warm and dry. No rash noted. Not diaphoretic. No erythema. No pallor.  Psychiatric: Mood, memory and judgment normal.  Vitals reviewed.  LABORATORY DATA: Lab Results  Component Value Date   WBC 12.2 (H) 12/23/2021   HGB 13.3 12/23/2021   HCT 39.3 12/23/2021   MCV 91.0 12/23/2021   PLT 320 12/23/2021      Chemistry      Component Value Date/Time   NA 138 12/23/2021 1249   NA 141 03/24/2020 0000   NA 147 (H) 12/11/2012 1336   K 4.5 12/23/2021 1249   K 4.9 12/11/2012 1336   CL 104 12/23/2021 1249   CL 104 12/11/2012 1336   CO2 21 (L) 12/23/2021 1249   CO2 25 12/11/2012 1336   BUN 17 12/23/2021 1249   BUN 13 03/24/2020 0000   BUN 16.0 12/11/2012 1336   CREATININE 0.94 12/23/2021 1249    CREATININE 0.95 12/14/2021 1645   CREATININE 0.9 12/11/2012 1336   GLU 117 03/24/2020 0000      Component Value Date/Time   CALCIUM 9.6 12/23/2021 1249   CALCIUM 10.1 12/11/2012  1336   ALKPHOS 102 12/23/2021 1249   ALKPHOS 97 12/11/2012 1336   AST 36 12/23/2021 1249   AST 17 12/11/2012 1336   ALT 31 12/23/2021 1249   ALT 13 12/11/2012 1336   BILITOT 0.8 12/23/2021 1249   BILITOT 0.45 12/11/2012 1336       RADIOGRAPHIC STUDIES: DG Chest 2 View  Result Date: 12/23/2021 CLINICAL DATA:  Status post fall. EXAM: CHEST - 2 VIEW COMPARISON:  November 10, 2021 FINDINGS: Calcific atherosclerotic disease and tortuosity of the aorta. Cardiomediastinal silhouette is normal. Mediastinal contours appear intact. There is no evidence of focal airspace consolidation, pleural effusion or pneumothorax. Osseous structures are without acute abnormality. Soft tissues are grossly normal. IMPRESSION: No active cardiopulmonary disease. Electronically Signed   By: Fidela Salisbury M.D.   On: 12/23/2021 13:41   CT Head Wo Contrast  Result Date: 12/23/2021 CLINICAL DATA:  Trauma, moderate to severe. EXAM: CT HEAD WITHOUT CONTRAST TECHNIQUE: Contiguous axial images were obtained from the base of the skull through the vertex without intravenous contrast. RADIATION DOSE REDUCTION: This exam was performed according to the departmental dose-optimization program which includes automated exposure control, adjustment of the mA and/or kV according to patient size and/or use of iterative reconstruction technique. COMPARISON:  May 09, 2020 FINDINGS: Brain: No evidence of acute infarction, hemorrhage, hydrocephalus, extra-axial collection or mass lesion/mass effect. Vascular: No hyperdense vessel or unexpected calcification. Skull: Normal. Negative for fracture or focal lesion. Sinuses/Orbits: No acute finding. Other: None. IMPRESSION: No acute intracranial abnormality. Electronically Signed   By: Fidela Salisbury M.D.   On:  12/23/2021 13:48   DG Shoulder Left  Result Date: 12/23/2021 CLINICAL DATA:  Trauma, fall EXAM: LEFT SHOULDER - 2+ VIEW COMPARISON:  12/13/2011 FINDINGS: No recent fracture or dislocation is seen. Degenerative changes are noted with bony spurs. There is decreased distance between acromion and proximal humerus, possibly suggesting chronic tear of rotator cuff. There are few smooth marginated calcifications adjacent to the inferior aspect of glenoid. Mild lobulations are seen in the superolateral margin of head of the left humerus. IMPRESSION: No recent fracture or dislocation is seen. Degenerative changes are noted with bony spurs and possible loose bodies in the left shoulder. Lobulated cortical margin in the superolateral aspect of head of the left humerus may be residual from previous injury. 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 Electronically Signed   By: Elmer Picker M.D.   On: 12/23/2021 13:45   DG Knee Complete 4 Views Left  Result Date: 12/23/2021 CLINICAL DATA:  Status post fall. Abrasions to the knees and elbows. EXAM: LEFT KNEE - COMPLETE 4+ VIEW COMPARISON:  None. FINDINGS: No evidence of fracture, dislocation, or joint effusion. Chondrocalcinosis in mediolateral compartments. Moderate in severity arthritic changes. IMPRESSION: 1. No acute fracture or dislocation identified about the left knee. 2. Chondrocalcinosis, usually associated with CPPD arthropathy. Electronically Signed   By: Fidela Salisbury M.D.   On: 12/23/2021 13:40   DG Knee Complete 4 Views Right  Result Date: 12/23/2021 CLINICAL DATA:  Status post fall.  Pain. EXAM: RIGHT KNEE - COMPLETE 4+ VIEW COMPARISON:  None. FINDINGS: No evidence of fracture, dislocation, or joint effusion. Chondrocalcinosis of the medial and lateral compartments. Arthritic changes in all 3 compartments, moderate. IMPRESSION: 1. No acute fracture or dislocation identified about the right knee. 2. Moderate 3 compartment arthritic changes with  chondrocalcinosis, suggestive of CPPD arthropathy. Electronically Signed   By: Fidela Salisbury M.D.   On: 12/23/2021 13:39   DG Hip Unilat  W or Wo Pelvis 2-3 Views Left  Result Date: 12/23/2021 CLINICAL DATA:  Status post fall.  Left hip pain. EXAM: DG HIP (WITH OR WITHOUT PELVIS) 2-3V LEFT COMPARISON:  None. FINDINGS: There is no evidence of hip fracture or dislocation. 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 IMPRESSION: No acute fracture or dislocation identified about the left hip. Electronically Signed   By: Fidela Salisbury M.D.   On: 12/23/2021 13:43   DG Hip Unilat W or Wo Pelvis 2-3 Views Right  Result Date: 12/23/2021 CLINICAL DATA:  Status post fall.  Right hip pain. EXAM: DG HIP (WITH OR WITHOUT PELVIS) 2-3V RIGHT COMPARISON:  None. FINDINGS: There is no evidence of hip fracture or dislocation. Mild osteoarthritic changes in the hip joint. IMPRESSION: No acute fracture or dislocation identified about the right hip. Electronically Signed   By: Fidela Salisbury M.D.   On: 12/23/2021 13:42    ASSESSMENT: This is a very pleasant 76 year old Caucasian female referred to the clinic for leukocytosis.  The patient was seen with Dr. Julien Nordmann today.  The patient had several lab studies performed including a CBC, CMP, LDH, and BCR/ABL.  The patient CBC from today shows ***.  Her CMP shows ***.   Dr. Julien Nordmann believes that the patient's condition is likely reactive as her white blood cell count fluctuates up and down as opposed to steadily increasing.  Additionally, she likely had mild leukocytosis recently due to her recent COVID-19 infection.  She also takes Symbicort which contains steroids as well as Flonase.  However, we will wait for the results of her BCR/ABL to assess for myeloproliferative disorder.  I will personally call the patient once I have the results of the studies.  We will determine from there if she needs a follow-up appointment or not.    The patient  voices understanding of current disease status and treatment options and is in agreement with the current care plan.  All questions were answered. The patient knows to call the clinic with any problems, questions or concerns. We can certainly see the patient much sooner if necessary.  Thank you so much for allowing me to participate in the care of Alexandra Wolfe. I will continue to follow up the patient with you and assist in her care.  I spent {CHL ONC TIME VISIT - QBVQX:[id number]} counseling the patient face to face. The total time spent in the appointment was {CHL ONC TIME VISIT - KLKJZ:[id number]}.  Disclaimer: This note was dictated with voice recognition software. Similar sounding words can inadvertently be transcribed and may not be corrected upon review.   Jazira Maloney L Mcgregor Tinnon January 01, 2022, 9:00 AM

## 2022-01-02 ENCOUNTER — Inpatient Hospital Stay: Payer: Medicare Other | Attending: Physician Assistant | Admitting: Physician Assistant

## 2022-01-02 ENCOUNTER — Inpatient Hospital Stay: Payer: Medicare Other

## 2022-01-07 ENCOUNTER — Telehealth: Payer: Self-pay | Admitting: Physician Assistant

## 2022-01-07 NOTE — Telephone Encounter (Signed)
Pt missed new hem appt with Cassie. Spoke to pt who told me she was recovering from Covid. R/s pt's appt, she is aware of new appt date and time. Mailed updated calendar to pt per pt request.

## 2022-01-08 ENCOUNTER — Other Ambulatory Visit: Payer: Self-pay

## 2022-01-08 ENCOUNTER — Ambulatory Visit: Payer: Medicare Other | Admitting: Nurse Practitioner

## 2022-01-08 ENCOUNTER — Other Ambulatory Visit: Payer: Self-pay | Admitting: Internal Medicine

## 2022-01-08 ENCOUNTER — Encounter: Payer: Self-pay | Admitting: Neurology

## 2022-01-08 DIAGNOSIS — R202 Paresthesia of skin: Secondary | ICD-10-CM

## 2022-01-08 NOTE — Telephone Encounter (Signed)
Please refill as per office routine med refill policy (all routine meds to be refilled for 3 mo or monthly (per pt preference) up to one year from last visit, then month to month grace period for 3 mo, then further med refills will have to be denied) ? ?

## 2022-01-08 NOTE — Telephone Encounter (Signed)
She was scheduled with me today 1/31 but is no longer on there. I do not see where she called to reschedule or any new appts. She will need to reschedule if she still needs surgical clearance. Just wanted to send as a FYI. Thanks!

## 2022-01-15 NOTE — Telephone Encounter (Signed)
Called and spoke with patient to see if we need to get her rescheduled for surgical clearance. She states that right now she has changed her mind and is not going to have the surgery done. She states that at her age she thinks it will be to much pain for her. Advised her that if anything changes for her to just have them send over another clearance. She expressed understanding. Nothing further needed at this time.

## 2022-01-18 NOTE — Progress Notes (Signed)
[facility] [phone]   [phone]  CONSULT NOTE  REFERRING PHYSICIAN: Cathlean Cower  REASON FOR CONSULTATION:  Leukocytosis   HPI Alexandra Wolfe is a 76 y.o. female with a past medical history significant for hypertension, atrial fibrillation, obstructive sleep apnea, asthma, GERD, hypothyroidism, diabetes, degenerative joint disease, and hyperlipidemia is referred to the clinic for evaluation of leukocytosis.  The patient had a routine follow-up visit with her primary care provider on 12/14/2021.  The patient had routine blood work that day that showed leukocytosis with a white blood cell count of 12.2 and neutrophil slightly elevated at 8.9.  Of note, the patient was seen in the emergency room on 12/23/2021 for weakness and her lower extremities and fall.  The patient has chronic knee pain and it is challenging for her to get around but she was weaker than normal.  While in the emergency room, the patient tested positive for COVID-19 on 12/23/2021.   The patient was referred to the clinic for evaluation regarding her leukocytosis.  The oldest records available to me ar from 14 years ago. The patient has been having fluctuating leukocytosis more often than not since that time. The highest WBC is 17.1.   The patient was actually seen here in the clinic by Dr. Humphrey Rolls in 2014 for the same concern.  Dr. Humphrey Rolls also felt that the patient's leukocytosis was likely reactive.  The patient states that she had some lab work performed, but she recalls that there was an issue with one of her blood samples.  She states that she was called to return to clinic to redraw a particular lab test.  The patient states that she never return to clinic for that lab draw  Per chart review, it appears that the patient is prescribed Symbicort BID.  The patient also takes Flonase daily.  The patient denies any prednisone use.  Besides the patient's recent COVID-19 infection, the patient  denies any fever, chills night sweats, or unexplained weight loss.  Besides being diagnosed with COVID-19 recently, the patient denies frequent infections with sore throat, cough, diarrhea, skin infections, or dysuria.  Denies any lymphadenopathy.  Denies any nausea, vomiting, diarrhea, or constipation.  Denies any herbal supplement use.  Denies any history of autoimmune disorders.  Denies any abnormal bleeding or bruising.  Per chart review, it appears that the patient has a history of allergies, although the patient denies any significant issues with allergies at this time.    The patient states that her brother also had leukocytosis and had an extensive work-up performed which was benign.  The patient denies any family history of any bone marrow or blood disorders.  The patient denies any history of malignancy except for her mother who had colorectal cancer.  The patient's mother also had atrial fibrillation.  The patient's father had myocardial infarction.  The patient worked in Science [employer].  The patient is widowed.  She has 2 children.  The patient is a former smoker having quit in her 38s to 10s.  She averaged 1 pack of cigarettes per day.  Denies any alcohol or street drug use.   HPI  Past Medical History:  Diagnosis Date   ALLERGIC RHINITIS 04/02/2009   no per pt   Anxiety    BACK PAIN 09/27/2008   BUNIONS, BILATERAL 12/23/2007   CHEST DISCOMFORT, ATYPICAL 11/07/2009   Chronic LBP    COLONIC POLYPS, HX OF 09/13/2007   CONSTIPATION 09/27/2008   DEPRESSION 09/13/2007  Diabetes mellitus    diet controlled   DYSPNEA ON EXERTION 02/06/2010   Eustachian tube dysfunction 05/20/2011   FOOT PAIN, RIGHT 09/27/2008   HYPERLIPIDEMIA 03/11/2008   LBP (low back pain) 05/20/2011   Leukocytosis 11/20/2011   OSTEOARTHROSIS NOS, LOWER LEG 09/13/2007   SHOULDER PAIN, LEFT 02/14/2009   SVT (supraventricular tachycardia) (HCC)    SYMPTOM, PALPITATIONS 09/13/2007   URINARY INCONTINENCE 08/23/2009   Vertigo  05/20/2011    Past Surgical History:  Procedure Laterality Date   BUNIONECTOMY     right   ccx     CHOLECYSTECTOMY     LUMBAR LAMINECTOMY     LUMBAR LAMINECTOMY     SHOULDER ARTHROSCOPY  12/13/2011   Procedure: ARTHROSCOPY SHOULDER;  Surgeon: Augustin Schooling;  Location: [facility];  Service: Orthopedics;  Laterality: Left;  Left Shoulder ArthroscopyDebridement Limited Tenodesis Open Rotator Cuff Repair Spur Removal Right Shoulder Injection     Family History  Problem Relation Age of Onset   Colon cancer Mother    Heart disease Father    Diabetes type II Father    Heart disease Brother    Diabetes Other        father   Esophageal cancer Neg Hx    Stomach cancer Neg Hx    Rectal cancer Neg Hx     Social History Social History   Tobacco Use   Smoking status: Former    Packs/day: 1.00    Years: 10.00    Pack years: 10.00    Types: Cigarettes   Smokeless tobacco: Never   Tobacco comments:    Smoked as a teenager. Quit in 1970  Substance Use Topics   Alcohol use: No    Alcohol/week: 0.0 standard drinks   Drug use: No    Allergies  Allergen Reactions   Aripiprazole     REACTION: agitation   Simvastatin     REACTION: myalgia    Current Outpatient Medications  Medication Sig Dispense Refill   acetaminophen (TYLENOL) 325 MG tablet Take 2 tablets (650 mg total) by mouth every 6 (six) hours as needed for mild pain or headache.     albuterol (VENTOLIN HFA) 108 (90 Base) MCG/ACT inhaler INHALE 2 PUFFS INTO THE LUNGS EVERY 6 HOURS AS NEEDED FOR WHEEZING OR SHORTNESS OF BREATH 8.5 g 7   aspirin 81 MG EC tablet Take by mouth.     atorvastatin (LIPITOR) 80 MG tablet TAKE 1 TABLET(80 MG) BY MOUTH DAILY 90 tablet 3   budesonide-formoterol (SYMBICORT) 160-4.5 MCG/ACT inhaler Inhale 2 puffs into the lungs 2 (two) times daily. 6 g 2   Calcium Carbonate (CALCIUM 500 PO) Take 1,000 mg by mouth 2 (two) times daily.      fluticasone (FLONASE) 50 MCG/ACT nasal spray SHAKE LIQUID AND USE 2  SPRAYS IN EACH NOSTRIL DAILY 48 g 1   glucose blood (ONETOUCH ULTRA) test strip Use as instructed three times daily E11.9 300 each 12   meclizine (ANTIVERT) 25 MG tablet Take 1 tablet (25 mg total) by mouth 3 (three) times daily as needed for dizziness. 90 tablet 0   metFORMIN (GLUCOPHAGE) 500 MG tablet Take 1 tablet (500 mg total) by mouth 2 (two) times daily with a meal. 180 tablet 3   metoprolol succinate (TOPROL-XL) 25 MG 24 hr tablet TAKE 1 TABLET(25 MG) BY MOUTH DAILY 90 tablet 3   Multiple Vitamin (MULTIVITAMIN WITH MINERALS) TABS tablet Take 1 tablet by mouth daily.     MYRBETRIQ 50 MG TB24 tablet  Take 1 tablet (50 mg total) by mouth at bedtime. 30 tablet 0   ondansetron (ZOFRAN) 4 MG tablet Take 1 tablet (4 mg total) by mouth every 8 (eight) hours as needed for nausea or vomiting. 30 tablet 0   oxybutynin (DITROPAN-XL) 10 MG 24 hr tablet Take by mouth.     pantoprazole (PROTONIX) 40 MG tablet Take 1 tablet (40 mg total) by mouth daily. 90 tablet 3   phentermine 30 MG capsule Take 1 capsule (30 mg total) by mouth every morning. 30 capsule 2   polyethylene glycol powder (GLYCOLAX/MIRALAX) 17 GM/SCOOP powder Take 17 g by mouth 2 (two) times daily as needed. 3350 g 3   senna-docusate (SENOKOT-S) 8.6-50 MG tablet Take 1 tablet by mouth at bedtime as needed for mild constipation.     traZODone (DESYREL) 50 MG tablet TAKE 1 TABLET(50 MG) BY MOUTH AT BEDTIME 90 tablet 1   cetirizine (ZYRTEC) 10 MG tablet Take 1 tablet (10 mg total) by mouth daily. (Patient not taking: Reported on 01/22/2022) 90 tablet 3   ergocalciferol (VITAMIN D2) 1.25 MG (50000 UT) capsule Take 1 tablet by mouth daily. (Patient not taking: Reported on 01/22/2022)     glucose blood test strip OneTouch Verio test strips  USE AS DIRECTED THREE TIMES DAILY     levETIRAcetam (KEPPRA) 500 MG tablet Take 1 tablet (500 mg total) by mouth 2 (two) times daily. (Patient not taking: Reported on 01/22/2022) 180 tablet 1   levothyroxine  (SYNTHROID) 50 MCG tablet TAKE 1 TABLET(50 MCG) BY MOUTH DAILY. NEEDS APPOINTMENT (Patient not taking: Reported on 01/22/2022) 90 tablet 3   meloxicam (MOBIC) 15 MG tablet TAKE 1 TABLET(15 MG) BY MOUTH DAILY (Patient not taking: Reported on 01/22/2022) 90 tablet 0   NON FORMULARY Heart healthy lcs diet (Patient not taking: Reported on 01/22/2022)     NP THYROID 30 MG tablet Take 1 tablet (30 mg total) by mouth daily. (Patient not taking: Reported on 01/22/2022) 30 tablet 0   triamcinolone cream (KENALOG) 0.1 % Apply 1 application topically 2 (two) times daily. (Patient not taking: Reported on 01/22/2022) 30 g 1   No current facility-administered medications for this visit.    REVIEW OF SYSTEMS:   Review of Systems  Constitutional: Positive for fatigue and generalized weakness.  Negative for appetite change, chills, fever and unexpected weight change.  HENT: Negative for mouth sores, nosebleeds, sore throat and trouble swallowing.   Eyes: Negative for eye problems and icterus.  Respiratory: Negative for cough, hemoptysis, shortness of breath and wheezing.   Cardiovascular: Negative for chest pain and leg swelling.  Gastrointestinal: Negative for abdominal pain, constipation, diarrhea, nausea and vomiting.  Genitourinary: Negative for bladder incontinence, difficulty urinating, dysuria, frequency and hematuria.   Musculoskeletal: Positive for arthritis in her knees.  Negative for back pain, gait problem, neck pain and neck stiffness.  Skin: Negative for itching and rash.  Neurological: Negative for dizziness, gait problem, headaches, light-headedness and seizures.  Hematological: Negative for adenopathy. Does not bruise/bleed easily.  Psychiatric/Behavioral: Negative for confusion, depression and sleep disturbance. The patient is not nervous/anxious.     PHYSICAL EXAMINATION:  Blood pressure (!) 147/71, pulse 79, temperature 98 F (36.7 C), temperature source Oral, resp. rate (!) 21, height 5\' 6"   (1.676 m), weight 233 lb 3.2 oz (105.8 kg), SpO2 98 %.  ECOG PERFORMANCE STATUS: 1-2  Physical Exam  Constitutional: Oriented to person, place, and time and well-developed, well-nourished, and in no distress.  HENT:  Head: Normocephalic  and atraumatic.  Mouth/Throat: Oropharynx is clear and moist. No oropharyngeal exudate.  Eyes: Conjunctivae are normal. Right eye exhibits no discharge. Left eye exhibits no discharge. No scleral icterus.  Neck: Normal range of motion. Neck supple.  Cardiovascular: Normal rate, regular rhythm, normal heart sounds and intact distal pulses.   Pulmonary/Chest: Effort normal and breath sounds normal. No respiratory distress. No wheezes. No rales.  Abdominal: Soft. Bowel sounds are normal. Exhibits no distension and no mass. There is no tenderness.  Musculoskeletal: Normal range of motion. Exhibits no edema.  Lymphadenopathy:    No cervical adenopathy.  Neurological: Alert and oriented to person, place, and time. Exhibits normal muscle tone. Gait normal. Coordination normal.  Skin: Skin is warm and dry. No rash noted. Not diaphoretic. No erythema. No pallor.  Psychiatric: Mood, memory and judgment normal.  Vitals reviewed.  LABORATORY DATA: Lab Results  Component Value Date   WBC 11.4 (H) 01/22/2022   HGB 13.2 01/22/2022   HCT 39.6 01/22/2022   MCV 90.2 01/22/2022   PLT 335 01/22/2022      Chemistry      Component Value Date/Time   NA 135 01/22/2022 1244   NA 141 03/24/2020 0000   NA 147 (H) 12/11/2012 1336   K 4.0 01/22/2022 1244   K 4.9 12/11/2012 1336   CL 105 01/22/2022 1244   CL 104 12/11/2012 1336   CO2 20 (L) 01/22/2022 1244   CO2 25 12/11/2012 1336   BUN 24 (H) 01/22/2022 1244   BUN 13 03/24/2020 0000   BUN 16.0 12/11/2012 1336   CREATININE 0.73 01/22/2022 1244   CREATININE 0.95 12/14/2021 1645   CREATININE 0.9 12/11/2012 1336   GLU 117 03/24/2020 0000      Component Value Date/Time   CALCIUM 9.3 01/22/2022 1244   CALCIUM 10.1  12/11/2012 1336   ALKPHOS 96 01/22/2022 1244   ALKPHOS 97 12/11/2012 1336   AST 43 (H) 01/22/2022 1244   AST 17 12/11/2012 1336   ALT 40 01/22/2022 1244   ALT 13 12/11/2012 1336   BILITOT 0.2 (L) 01/22/2022 1244   BILITOT 0.45 12/11/2012 1336       RADIOGRAPHIC STUDIES: No results found.  Assessment and plan This is a very pleasant 76 year old Caucasian female referred to the clinic for leukocytosis.  The patient was seen with Dr. Julien Nordmann today.  The patient had several lab studies performed including a CBC, CMP, LDH, and BCR/ABL.  The patient CBC from today shows mild leukocytosis with a white blood cell count 11.4.  The rest of her CBC is unremarkable..   Dr. Julien Nordmann believes that the patient's condition is likely reactive as her white blood cell count fluctuates up and down as opposed to steadily increasing.  Additionally, she likely had mild leukocytosis recently due to her recent COVID-19 infection.  She also takes Symbicort which contains steroids as well as Flonase.  However, we will wait for the results of her BCR/ABL to assess for myeloproliferative disorder.  I will personally call the patient once I have the results of the studies.  We will determine from there if she needs a follow-up appointment or not.  If her BCR/ABL testing is negative, then no follow-up needed and she can continue to follow with her primary care provider.  The patient voices understanding of current disease status and treatment options and is in agreement with the current care plan.  All questions were answered. The patient knows to call the clinic with any problems, questions or concerns.  We can certainly see the patient much sooner if necessary.  Thank you so much for allowing me to participate in the care of Alexandra Wolfe. I will continue to follow up the patient with you and assist in her care.   Disclaimer: This note was dictated with voice recognition software. Similar sounding words can  inadvertently be transcribed and may not be corrected upon review.   Fareeha Evon L Marciel Offenberger January 22, 2022, 2:12 PM  ADDENDUM: Hematology/Oncology Attending: I had a face-to-face encounter with the patient today.  I reviewed her record, lab and recommended her care plan.  This is a very pleasant 76 years old white female with past medical history significant for GERD, hypothyroidism, diabetes mellitus, dyslipidemia, hypertension, atrial fibrillation and obstructive sleep apnea.  The patient was seen recently by her primary care provider and during blood work she was noted to have persistently elevated white blood count of 12.2 and absolute neutrophil count was elevated at 8900.  He was diagnosed with COVID-19 in the middle of January 2023. Reviewing her records the patient has leukocytosis for at least the last 14 years.  The highest that was reported was 17.1.  She is not currently on any hormonal therapy but she is using some steroid inhalers.  She is a former smoker but quit long time ago. I had a lengthy discussion with the patient and her son today about her condition.  I think her condition is reactive leukocytosis but we will order molecular studies for BCR/ABL to rule out any underlying chronic myeloid leukemia. We will call the patient with the results of this test but if positive we will ask her to come sooner for evaluation. She was advised to follow-up with her primary care physician from now on if the test is negative.  She was also advised to call if she has any other concerning issues in the interval. The total time spent in the appointment was 60 minutes. Disclaimer: This note was dictated with voice recognition software. Similar sounding words can inadvertently be transcribed and may be missed upon review. Eilleen Kempf, MD 01/22/22

## 2022-01-22 ENCOUNTER — Inpatient Hospital Stay: Payer: Medicare Other | Attending: Physician Assistant | Admitting: Physician Assistant

## 2022-01-22 ENCOUNTER — Inpatient Hospital Stay: Payer: Medicare Other

## 2022-01-22 ENCOUNTER — Other Ambulatory Visit: Payer: Self-pay

## 2022-01-22 ENCOUNTER — Encounter: Payer: Self-pay | Admitting: Physician Assistant

## 2022-01-22 VITALS — BP 147/71 | HR 79 | Temp 98.0°F | Resp 21 | Ht 66.0 in | Wt 233.2 lb

## 2022-01-22 DIAGNOSIS — D72829 Elevated white blood cell count, unspecified: Secondary | ICD-10-CM | POA: Diagnosis not present

## 2022-01-22 DIAGNOSIS — Z87891 Personal history of nicotine dependence: Secondary | ICD-10-CM

## 2022-01-22 LAB — CBC WITH DIFFERENTIAL (CANCER CENTER ONLY)
Abs Immature Granulocytes: 0.03 10*3/uL (ref 0.00–0.07)
Basophils Absolute: 0.1 10*3/uL (ref 0.0–0.1)
Basophils Relative: 1 %
Eosinophils Absolute: 0.5 10*3/uL (ref 0.0–0.5)
Eosinophils Relative: 5 %
HCT: 39.6 % (ref 36.0–46.0)
Hemoglobin: 13.2 g/dL (ref 12.0–15.0)
Immature Granulocytes: 0 %
Lymphocytes Relative: 33 %
Lymphs Abs: 3.8 10*3/uL (ref 0.7–4.0)
MCH: 30.1 pg (ref 26.0–34.0)
MCHC: 33.3 g/dL (ref 30.0–36.0)
MCV: 90.2 fL (ref 80.0–100.0)
Monocytes Absolute: 0.7 10*3/uL (ref 0.1–1.0)
Monocytes Relative: 6 %
Neutro Abs: 6.3 10*3/uL (ref 1.7–7.7)
Neutrophils Relative %: 55 %
Platelet Count: 335 10*3/uL (ref 150–400)
RBC: 4.39 MIL/uL (ref 3.87–5.11)
RDW: 13.1 % (ref 11.5–15.5)
WBC Count: 11.4 10*3/uL — ABNORMAL HIGH (ref 4.0–10.5)
nRBC: 0 % (ref 0.0–0.2)

## 2022-01-22 LAB — CMP (CANCER CENTER ONLY)
ALT: 40 U/L (ref 0–44)
AST: 43 U/L — ABNORMAL HIGH (ref 15–41)
Albumin: 4.2 g/dL (ref 3.5–5.0)
Alkaline Phosphatase: 96 U/L (ref 38–126)
Anion gap: 10 (ref 5–15)
BUN: 24 mg/dL — ABNORMAL HIGH (ref 8–23)
CO2: 20 mmol/L — ABNORMAL LOW (ref 22–32)
Calcium: 9.3 mg/dL (ref 8.9–10.3)
Chloride: 105 mmol/L (ref 98–111)
Creatinine: 0.73 mg/dL (ref 0.44–1.00)
GFR, Estimated: >60 mL/min (ref >60)
Glucose, Bld: 151 mg/dL — ABNORMAL HIGH (ref 70–99)
Potassium: 4 mmol/L (ref 3.5–5.1)
Sodium: 135 mmol/L (ref 135–145)
Total Bilirubin: 0.2 mg/dL — ABNORMAL LOW (ref 0.3–1.2)
Total Protein: 6.9 g/dL (ref 6.5–8.1)

## 2022-01-22 LAB — LACTATE DEHYDROGENASE: LDH: 151 U/L (ref 98–192)

## 2022-01-25 ENCOUNTER — Encounter: Payer: Self-pay | Admitting: Internal Medicine

## 2022-01-29 ENCOUNTER — Telehealth: Payer: Self-pay | Admitting: Physician Assistant

## 2022-01-29 LAB — BCR ABL1 FISH (GENPATH)

## 2022-01-29 NOTE — Telephone Encounter (Signed)
I called the patient to let her her BCR/ABL testing was normal. Her leukocytosis is likely reactive. Discussed no follow up is needed with out clinic; however, if any future concerns, we are always happy to see her back for re-evaluation. She expressed understanding and was appreciative of the call.

## 2022-02-12 ENCOUNTER — Other Ambulatory Visit: Payer: Self-pay

## 2022-02-12 ENCOUNTER — Ambulatory Visit: Payer: Medicare Other | Admitting: Neurology

## 2022-02-12 DIAGNOSIS — R202 Paresthesia of skin: Secondary | ICD-10-CM

## 2022-02-12 NOTE — Progress Notes (Signed)
Patient arrived for EMG, however, did not wish to proceed with testing because she did not intend to follow-up with Dr. Chelsea Aus office.  Testing was not performed at patient's request. ?

## 2022-03-26 ENCOUNTER — Ambulatory Visit (INDEPENDENT_AMBULATORY_CARE_PROVIDER_SITE_OTHER): Payer: Medicare Other

## 2022-03-26 DIAGNOSIS — Z0000 Encounter for general adult medical examination without abnormal findings: Secondary | ICD-10-CM

## 2022-03-26 NOTE — Patient Instructions (Signed)
Alexandra Wolfe , ?Thank you for taking time to come for your Medicare Wellness Visit. I appreciate your ongoing commitment to your health goals. Please review the following plan we discussed and let me know if I can assist you in the future.  ? ?Screening recommendations/referrals: ?Colonoscopy: Not a candidate for screening due to age ?Mammogram: Not a candidate for screening due to age ?Bone Density: Not a candidate for screening due to age ?Recommended yearly ophthalmology/optometry visit for glaucoma screening and checkup ?Recommended yearly dental visit for hygiene and checkup ? ?Vaccinations: ?Influenza vaccine: 12/14/2021 ?Pneumococcal vaccine: 11/09/2012, 03/01/2015 ?Tdap vaccine: 11/09/2012 ?Shingles vaccine: never done   ?Covid-19: 03/15/2020, 04/02/2020 ? ?Advanced directives: Yes; Please bring a copy of your health care power of attorney and living will to the office at your convenience. ? ?Conditions/risks identified: Yes ? ?Next appointment: Please schedule your next Medicare Wellness Visit with your Nurse Health Advisor in 1 year by calling [phone]. ? ? ?Preventive Care 75 Years and Older, Female ?Preventive care refers to lifestyle choices and visits with your health care provider that can promote health and wellness. ?What does preventive care include? ?A yearly physical exam. This is also called an annual well check. ?Dental exams once or twice a year. ?Routine eye exams. Ask your health care provider how often you should have your eyes checked. ?Personal lifestyle choices, including: ?Daily care of your teeth and gums. ?Regular physical activity. ?Eating a healthy diet. ?Avoiding tobacco and drug use. ?Limiting alcohol use. ?Practicing safe sex. ?Taking low-dose aspirin every day. ?Taking vitamin and mineral supplements as recommended by your health care provider. ?What happens during an annual well check? ?The services and screenings done by your health care provider during your annual well check will  depend on your age, overall health, lifestyle risk factors, and family history of disease. ?Counseling  ?Your health care provider may ask you questions about your: ?Alcohol use. ?Tobacco use. ?Drug use. ?Emotional well-being. ?Home and relationship well-being. ?Sexual activity. ?Eating habits. ?History of falls. ?Memory and ability to understand (cognition). ?Work and work Statistician. ?Reproductive health. ?Screening  ?You may have the following tests or measurements: ?Height, weight, and BMI. ?Blood pressure. ?Lipid and cholesterol levels. These may be checked every 5 years, or more frequently if you are over 28 years old. ?Skin check. ?Lung cancer screening. You may have this screening every year starting at age 66 if you have a 30-pack-year history of smoking and currently smoke or have quit within the past 15 years. ?Fecal occult blood test (FOBT) of the stool. You may have this test every year starting at age 102. ?Flexible sigmoidoscopy or colonoscopy. You may have a sigmoidoscopy every 5 years or a colonoscopy every 10 years starting at age 33. ?Hepatitis C blood test. ?Hepatitis B blood test. ?Sexually transmitted disease (STD) testing. ?Diabetes screening. This is done by checking your blood sugar (glucose) after you have not eaten for a while (fasting). You may have this done every 1-3 years. ?Bone density scan. This is done to screen for osteoporosis. You may have this done starting at age 33. ?Mammogram. This may be done every 1-2 years. Talk to your health care provider about how often you should have regular mammograms. ?Talk with your health care provider about your test results, treatment options, and if necessary, the need for more tests. ?Vaccines  ?Your health care provider may recommend certain vaccines, such as: ?Influenza vaccine. This is recommended every year. ?Tetanus, diphtheria, and acellular pertussis (Tdap, Td) vaccine. You  may need a Td booster every 10 years. ?Zoster vaccine. You may  need this after age 78. ?Pneumococcal 13-valent conjugate (PCV13) vaccine. One dose is recommended after age 13. ?Pneumococcal polysaccharide (PPSV23) vaccine. One dose is recommended after age 46. ?Talk to your health care provider about which screenings and vaccines you need and how often you need them. ?This information is not intended to replace advice given to you by your health care provider. Make sure you discuss any questions you have with your health care provider. ?Document Released: 12/22/2015 Document Revised: 08/14/2016 Document Reviewed: 09/26/2015 ?Elsevier Interactive Patient Education ? 2017 [place]. ? ?Fall Prevention in the Home ?Falls can cause injuries. They can happen to people of all ages. There are many things you can do to make your home safe and to help prevent falls. ?What can I do on the outside of my home? ?Regularly fix the edges of walkways and driveways and fix any cracks. ?Remove anything that might make you trip as you walk through a door, such as a raised step or threshold. ?Trim any bushes or trees on the path to your home. ?Use bright outdoor lighting. ?Clear any walking paths of anything that might make someone trip, such as rocks or tools. ?Regularly check to see if handrails are loose or broken. Make sure that both sides of any steps have handrails. ?Any raised decks and porches should have guardrails on the edges. ?Have any leaves, snow, or ice cleared regularly. ?Use sand or salt on walking paths during winter. ?Clean up any spills in your garage right away. This includes oil or grease spills. ?What can I do in the bathroom? ?Use night lights. ?Install grab bars by the toilet and in the tub and shower. Do not use towel bars as grab bars. ?Use non-skid mats or decals in the tub or shower. ?If you need to sit down in the shower, use a plastic, non-slip stool. ?Keep the floor dry. Clean up any water that spills on the floor as soon as it happens. ?Remove soap buildup in  the tub or shower regularly. ?Attach bath mats securely with double-sided non-slip rug tape. ?Do not have throw rugs and other things on the floor that can make you trip. ?What can I do in the bedroom? ?Use night lights. ?Make sure that you have a light by your bed that is easy to reach. ?Do not use any sheets or blankets that are too big for your bed. They should not hang down onto the floor. ?Have a firm chair that has side arms. You can use this for support while you get dressed. ?Do not have throw rugs and other things on the floor that can make you trip. ?What can I do in the kitchen? ?Clean up any spills right away. ?Avoid walking on wet floors. ?Keep items that you use a lot in easy-to-reach places. ?If you need to reach something above you, use a strong step stool that has a grab bar. ?Keep electrical cords out of the way. ?Do not use floor polish or wax that makes floors slippery. If you must use wax, use non-skid floor wax. ?Do not have throw rugs and other things on the floor that can make you trip. ?What can I do with my stairs? ?Do not leave any items on the stairs. ?Make sure that there are handrails on both sides of the stairs and use them. Fix handrails that are broken or loose. Make sure that handrails are as long as the  stairways. ?Check any carpeting to make sure that it is firmly attached to the stairs. Fix any carpet that is loose or worn. ?Avoid having throw rugs at the top or bottom of the stairs. If you do have throw rugs, attach them to the floor with carpet tape. ?Make sure that you have a light switch at the top of the stairs and the bottom of the stairs. If you do not have them, ask someone to add them for you. ?What else can I do to help prevent falls? ?Wear shoes that: ?Do not have high heels. ?Have rubber bottoms. ?Are comfortable and fit you well. ?Are closed at the toe. Do not wear sandals. ?If you use a stepladder: ?Make sure that it is fully opened. Do not climb a closed  stepladder. ?Make sure that both sides of the stepladder are locked into place. ?Ask someone to hold it for you, if possible. ?Clearly mark and make sure that you can see: ?Any grab bars or handrails. ?First and l

## 2022-03-26 NOTE — Progress Notes (Signed)
?I connected with Alexandra Wolfe today by telephone and verified that I am speaking with the correct person using two identifiers. ?Location patient: home ?Location provider: work ?Persons participating in the virtual visit: patient, provider. ?  ?I discussed the limitations, risks, security and privacy concerns of performing an evaluation and management service by telephone and the availability of in person appointments. I also discussed with the patient that there may be a patient responsible charge related to this service. The patient expressed understanding and verbally consented to this telephonic visit.  ?  ?Interactive audio and video telecommunications were attempted between this provider and patient, however failed, due to patient having technical difficulties OR patient did not have access to video capability.  We continued and completed visit with audio only. ? ?Some vital signs may be absent or patient reported.  ? ?Time Spent with patient on telephone encounter: 30 minutes ? ?Subjective:  ? Alexandra Wolfe is a 76 y.o. female who presents for Medicare Annual (Subsequent) preventive examination. ? ?Review of Systems    ? ?Cardiac Risk Factors include: advanced age (>51mn, >>37women);diabetes mellitus;family history of premature cardiovascular disease;hypertension;obesity (BMI >30kg/m2);sedentary lifestyle ? ?   ?Objective:  ?  ?Today's Vitals  ? 03/26/22 1017  ?PainSc: 0-No pain  ? ?There is no height or weight on file to calculate BMI. ? ? ?  03/26/2022  ? 10:19 AM 01/22/2022  ?  1:10 PM 12/23/2021  ? 12:54 PM 11/10/2021  ?  1:53 PM 03/21/2021  ?  3:31 PM 03/21/2020  ? 11:36 AM 03/14/2020  ? 10:53 PM  ?Advanced Directives  ?Does Patient Have a Medical Advance Directive? Yes No No No Yes Yes No  ?Type of Advance Directive Living will;Healthcare Power of [place]   ?Does patient want to make changes to medical advance directive? No - Patient declined     No - Patient declined  No - Patient declined  ?Copy of [place] Chart? No - copy requested    No - copy requested    ?Would patient like information on creating a medical advance directive?   No - Patient declined No - Patient declined   No - Patient declined  ? ? ?Current Medications (verified) ?Outpatient Encounter Medications as of 03/26/2022  ?Medication Sig  ? acetaminophen (TYLENOL) 325 MG tablet Take 2 tablets (650 mg total) by mouth every 6 (six) hours as needed for mild pain or headache.  ? albuterol (VENTOLIN HFA) 108 (90 Base) MCG/ACT inhaler INHALE 2 PUFFS INTO THE LUNGS EVERY 6 HOURS AS NEEDED FOR WHEEZING OR SHORTNESS OF BREATH  ? aspirin 81 MG EC tablet Take by mouth.  ? atorvastatin (LIPITOR) 80 MG tablet TAKE 1 TABLET(80 MG) BY MOUTH DAILY  ? budesonide-formoterol (SYMBICORT) 160-4.5 MCG/ACT inhaler Inhale 2 puffs into the lungs 2 (two) times daily.  ? Calcium Carbonate (CALCIUM 500 PO) Take 1,000 mg by mouth 2 (two) times daily.   ? cetirizine (ZYRTEC) 10 MG tablet Take 1 tablet (10 mg total) by mouth daily. (Patient not taking: Reported on 01/22/2022)  ? ergocalciferol (VITAMIN D2) 1.25 MG (50000 UT) capsule Take 1 tablet by mouth daily. (Patient not taking: Reported on 01/22/2022)  ? fluticasone (FLONASE) 50 MCG/ACT nasal spray SHAKE LIQUID AND USE 2 SPRAYS IN EACH NOSTRIL DAILY  ? glucose blood (ONETOUCH ULTRA) test strip Use as instructed three times daily E11.9  ? glucose blood test strip OneTouch Verio test strips ?  USE AS DIRECTED THREE TIMES DAILY  ? levETIRAcetam (KEPPRA) 500 MG tablet Take 1 tablet (500 mg total) by mouth 2 (two) times daily. (Patient not taking: Reported on 01/22/2022)  ? levothyroxine (SYNTHROID) 50 MCG tablet TAKE 1 TABLET(50 MCG) BY MOUTH DAILY. NEEDS APPOINTMENT (Patient not taking: Reported on 01/22/2022)  ? meclizine (ANTIVERT) 25 MG tablet Take 1 tablet (25 mg total) by mouth 3 (three) times daily as needed for dizziness.  ? meloxicam (MOBIC) 15 MG tablet TAKE 1  TABLET(15 MG) BY MOUTH DAILY (Patient not taking: Reported on 01/22/2022)  ? metFORMIN (GLUCOPHAGE) 500 MG tablet Take 1 tablet (500 mg total) by mouth 2 (two) times daily with a meal.  ? metoprolol succinate (TOPROL-XL) 25 MG 24 hr tablet TAKE 1 TABLET(25 MG) BY MOUTH DAILY  ? Multiple Vitamin (MULTIVITAMIN WITH MINERALS) TABS tablet Take 1 tablet by mouth daily.  ? MYRBETRIQ 50 MG TB24 tablet Take 1 tablet (50 mg total) by mouth at bedtime.  ? NON FORMULARY Heart healthy lcs diet (Patient not taking: Reported on 01/22/2022)  ? NP THYROID 30 MG tablet Take 1 tablet (30 mg total) by mouth daily. (Patient not taking: Reported on 01/22/2022)  ? ondansetron (ZOFRAN) 4 MG tablet Take 1 tablet (4 mg total) by mouth every 8 (eight) hours as needed for nausea or vomiting.  ? oxybutynin (DITROPAN-XL) 10 MG 24 hr tablet Take by mouth.  ? pantoprazole (PROTONIX) 40 MG tablet Take 1 tablet (40 mg total) by mouth daily.  ? phentermine 30 MG capsule Take 1 capsule (30 mg total) by mouth every morning.  ? polyethylene glycol powder (GLYCOLAX/MIRALAX) 17 GM/SCOOP powder Take 17 g by mouth 2 (two) times daily as needed.  ? senna-docusate (SENOKOT-S) 8.6-50 MG tablet Take 1 tablet by mouth at bedtime as needed for mild constipation.  ? traZODone (DESYREL) 50 MG tablet TAKE 1 TABLET(50 MG) BY MOUTH AT BEDTIME  ? ?No facility-administered encounter medications on file as of 03/26/2022.  ? ? ?Allergies (verified) ?Aripiprazole and Simvastatin  ? ?History: ?Past Medical History:  ?Diagnosis Date  ? ALLERGIC RHINITIS 04/02/2009  ? no per pt  ? Anxiety   ? BACK PAIN 09/27/2008  ? BUNIONS, BILATERAL 12/23/2007  ? CHEST DISCOMFORT, ATYPICAL 11/07/2009  ? Chronic LBP   ? COLONIC POLYPS, HX OF 09/13/2007  ? CONSTIPATION 09/27/2008  ? DEPRESSION 09/13/2007  ? Diabetes mellitus   ? diet controlled  ? DYSPNEA ON EXERTION 02/06/2010  ? Eustachian tube dysfunction 05/20/2011  ? FOOT PAIN, RIGHT 09/27/2008  ? HYPERLIPIDEMIA 03/11/2008  ? LBP (low back pain)  05/20/2011  ? Leukocytosis 11/20/2011  ? OSTEOARTHROSIS NOS, LOWER LEG 09/13/2007  ? SHOULDER PAIN, LEFT 02/14/2009  ? SVT (supraventricular tachycardia) ([place])   ? SYMPTOM, PALPITATIONS 09/13/2007  ? URINARY INCONTINENCE 08/23/2009  ? Vertigo 05/20/2011  ? ?Past Surgical History:  ?Procedure Laterality Date  ? BUNIONECTOMY    ? right  ? ccx    ? CHOLECYSTECTOMY    ? LUMBAR LAMINECTOMY    ? LUMBAR LAMINECTOMY    ? SHOULDER ARTHROSCOPY  12/13/2011  ? Procedure: ARTHROSCOPY SHOULDER;  Surgeon: Augustin Schooling;  Location: [facility];  Service: Orthopedics;  Laterality: Left;  Left Shoulder ArthroscopyDebridement Limited Tenodesis Open Rotator Cuff Repair Spur Removal Right Shoulder Injection   ? ?Family History  ?Problem Relation Age of Onset  ? Colon cancer Mother   ? Heart disease Father   ? Diabetes type II Father   ? Heart disease Brother   ?  Diabetes Other   ?     father  ? Esophageal cancer Neg Hx   ? Stomach cancer Neg Hx   ? Rectal cancer Neg Hx   ? ?Social History  ? ?Socioeconomic History  ? Marital status: Married  ?  Spouse name: Not on file  ? Number of children: 2  ? Years of education: Not on file  ? Highest education level: Not on file  ?Occupational History  ? Occupation: Disabled Psych.  ?Tobacco Use  ? Smoking status: Former  ?  Packs/day: 1.00  ?  Years: 10.00  ?  Pack years: 10.00  ?  Types: Cigarettes  ? Smokeless tobacco: Never  ? Tobacco comments:  ?  Smoked as a teenager. Quit in 1970  ?Substance and Sexual Activity  ? Alcohol use: No  ?  Alcohol/week: 0.0 standard drinks  ? Drug use: No  ? Sexual activity: Never  ?Other Topics Concern  ? Not on file  ?Social History Narrative  ? Husband chronically ill in nursing home.  ?   ? Disabled - psychiatric  ? ?Social Determinants of Health  ? ?Financial Resource Strain: Low Risk   ? Difficulty of Paying Living Expenses: Not hard at all  ?Food Insecurity: No Food Insecurity  ? Worried About Charity fundraiser in the Last Year: Never true  ? Ran Out of Food in the  Last Year: Never true  ?Transportation Needs: No Transportation Needs  ? Lack of Transportation (Medical): No  ? Lack of Transportation (Non-Medical): No  ?Physical Activity: Inactive  ? Days of Exercise per Week

## 2022-05-20 ENCOUNTER — Telehealth: Payer: Self-pay | Admitting: Internal Medicine

## 2022-05-20 DIAGNOSIS — J309 Allergic rhinitis, unspecified: Secondary | ICD-10-CM

## 2022-05-20 NOTE — Telephone Encounter (Signed)
Patient would like her zyrtec refilled - Please send to [facility] on [address] - [address]

## 2022-05-22 ENCOUNTER — Encounter: Payer: Self-pay | Admitting: Gastroenterology

## 2022-05-22 MED ORDER — CETIRIZINE HCL 10 MG PO TABS: 10.0000 mg | ORAL_TABLET | Freq: Every day | ORAL | 3 refills | Status: DC

## 2022-05-22 NOTE — Telephone Encounter (Signed)
Ok this is done 

## 2022-06-04 ENCOUNTER — Telehealth: Payer: Self-pay | Admitting: Internal Medicine

## 2022-06-04 NOTE — Telephone Encounter (Signed)
Caller & Relationship to patient:self   Call back number: [phone]  Date of last office visit:1.6.23   Date of next office visit:   Medication(s) to be refilled:metFORMIN (GLUCOPHAGE) 500 MG tablet        Preferred Pharmacy:  [id number] [place], [place] - [address] AT [facility] [address] Phone:  [phone]  Fax:  [phone]

## 2022-06-06 ENCOUNTER — Ambulatory Visit: Payer: Medicare Other | Admitting: Internal Medicine

## 2022-06-06 MED ORDER — METFORMIN HCL 500 MG PO TABS: 500.0000 mg | ORAL_TABLET | Freq: Two times a day (BID) | ORAL | 2 refills | Status: DC

## 2022-06-06 NOTE — Telephone Encounter (Signed)
Pt is calling to get an update on the refill request on: metFORMIN (GLUCOPHAGE) 500 MG tablet  Pharmacy: [facility] [place], [address] AT [address]  LOV 12/14/21 ROV 06/10/22  Please advise

## 2022-06-06 NOTE — Telephone Encounter (Signed)
I called the pt to advised her that her medication was sent to the [facility] on [address].

## 2022-06-10 ENCOUNTER — Ambulatory Visit (INDEPENDENT_AMBULATORY_CARE_PROVIDER_SITE_OTHER): Payer: Medicare Other | Admitting: Internal Medicine

## 2022-06-10 VITALS — BP 140/72 | HR 73 | Temp 97.8°F | Ht 66.0 in | Wt 228.0 lb

## 2022-06-10 DIAGNOSIS — F32A Depression, unspecified: Secondary | ICD-10-CM | POA: Diagnosis not present

## 2022-06-10 DIAGNOSIS — E1142 Type 2 diabetes mellitus with diabetic polyneuropathy: Secondary | ICD-10-CM

## 2022-06-10 DIAGNOSIS — E538 Deficiency of other specified B group vitamins: Secondary | ICD-10-CM

## 2022-06-10 DIAGNOSIS — R42 Dizziness and giddiness: Secondary | ICD-10-CM

## 2022-06-10 DIAGNOSIS — R296 Repeated falls: Secondary | ICD-10-CM | POA: Diagnosis not present

## 2022-06-10 DIAGNOSIS — E119 Type 2 diabetes mellitus without complications: Secondary | ICD-10-CM

## 2022-06-10 DIAGNOSIS — M170 Bilateral primary osteoarthritis of knee: Secondary | ICD-10-CM | POA: Diagnosis not present

## 2022-06-10 DIAGNOSIS — E559 Vitamin D deficiency, unspecified: Secondary | ICD-10-CM

## 2022-06-10 LAB — HEPATIC FUNCTION PANEL
ALT: 24 U/L (ref 0–35)
AST: 28 U/L (ref 0–37)
Albumin: 4.7 g/dL (ref 3.5–5.2)
Alkaline Phosphatase: 109 U/L (ref 39–117)
Bilirubin, Direct: 0.2 mg/dL (ref 0.0–0.3)
Total Bilirubin: 0.6 mg/dL (ref 0.2–1.2)
Total Protein: 7.4 g/dL (ref 6.0–8.3)

## 2022-06-10 LAB — CBC WITH DIFFERENTIAL/PLATELET
Basophils Absolute: 0.1 10*3/uL (ref 0.0–0.1)
Basophils Relative: 0.5 % (ref 0.0–3.0)
Eosinophils Absolute: 0.3 10*3/uL (ref 0.0–0.7)
Eosinophils Relative: 2.3 % (ref 0.0–5.0)
HCT: 40.6 % (ref 36.0–46.0)
Hemoglobin: 13.5 g/dL (ref 12.0–15.0)
Lymphocytes Relative: 32.3 % (ref 12.0–46.0)
Lymphs Abs: 3.9 10*3/uL (ref 0.7–4.0)
MCHC: 33.3 g/dL (ref 30.0–36.0)
MCV: 86.9 fl (ref 78.0–100.0)
Monocytes Absolute: 0.8 10*3/uL (ref 0.1–1.0)
Monocytes Relative: 6.6 % (ref 3.0–12.0)
Neutro Abs: 7.1 10*3/uL (ref 1.4–7.7)
Neutrophils Relative %: 58.3 % (ref 43.0–77.0)
Platelets: 392 10*3/uL (ref 150.0–400.0)
RBC: 4.68 Mil/uL (ref 3.87–5.11)
RDW: 14 % (ref 11.5–15.5)
WBC: 12.2 10*3/uL — ABNORMAL HIGH (ref 4.0–10.5)

## 2022-06-10 LAB — LIPID PANEL
Cholesterol: 140 mg/dL (ref 0–200)
HDL: 38.9 mg/dL — ABNORMAL LOW (ref >39.00)
NonHDL: 101.52
Total CHOL/HDL Ratio: 4
Triglycerides: 251 mg/dL — ABNORMAL HIGH (ref 0.0–149.0)
VLDL: 50.2 mg/dL — ABNORMAL HIGH (ref 0.0–40.0)

## 2022-06-10 LAB — BASIC METABOLIC PANEL
BUN: 22 mg/dL (ref 6–23)
CO2: 20 mEq/L (ref 19–32)
Calcium: 10.3 mg/dL (ref 8.4–10.5)
Chloride: 101 mEq/L (ref 96–112)
Creatinine, Ser: 0.97 mg/dL (ref 0.40–1.20)
GFR: 56.71 mL/min — ABNORMAL LOW (ref >60.00)
Glucose, Bld: 167 mg/dL — ABNORMAL HIGH (ref 70–99)
Potassium: 3.9 mEq/L (ref 3.5–5.1)
Sodium: 136 mEq/L (ref 135–145)

## 2022-06-10 LAB — VITAMIN B12: Vitamin B-12: 845 pg/mL (ref 211–911)

## 2022-06-10 LAB — HEMOGLOBIN A1C: Hgb A1c MFr Bld: 6.9 % — ABNORMAL HIGH (ref 4.6–6.5)

## 2022-06-10 LAB — VITAMIN D 25 HYDROXY (VIT D DEFICIENCY, FRACTURES): VITD: 42.55 ng/mL (ref 30.00–100.00)

## 2022-06-10 LAB — TSH: TSH: 3.56 u[IU]/mL (ref 0.35–5.50)

## 2022-06-10 LAB — LDL CHOLESTEROL, DIRECT: Direct LDL: 75 mg/dL

## 2022-06-10 MED ORDER — CITALOPRAM HYDROBROMIDE 20 MG PO TABS: 20.0000 mg | ORAL_TABLET | Freq: Every day | ORAL | 3 refills | Status: DC

## 2022-06-10 MED ORDER — GABAPENTIN 100 MG PO CAPS: 100.0000 mg | ORAL_CAPSULE | Freq: Three times a day (TID) | ORAL | 1 refills | Status: DC

## 2022-06-10 MED ORDER — MECLIZINE HCL 25 MG PO TABS: 25.0000 mg | ORAL_TABLET | Freq: Three times a day (TID) | ORAL | 0 refills | Status: DC | PRN

## 2022-06-10 MED ORDER — ONDANSETRON 4 MG PO TBDP: 4.0000 mg | ORAL_TABLET | Freq: Three times a day (TID) | ORAL | 1 refills | Status: DC | PRN

## 2022-06-10 NOTE — Progress Notes (Signed)
Patient ID: Alexandra Wolfe, female   DOB: 11-29-1946, 76 y.o.   MRN: [id number]        Chief Complaint: follow up recent vertigo episode, chronic bilateral knee pain, dm with neuropathy pain, anxiety depression, gait d/o and hx of TBI       HPI:  Alexandra Wolfe is a 76 y.o. 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  She thinks this was b/c he would have to provide transport for her, but is afraid to address this with him, as she fears he will cut her off completely from what he does do.  She asked for in home PT but ortho states they dont arrange that.  Has also had increased LE neuritic pain, asks to restart the gabapentin.  Also has worsening possibly situational anxiety depression symptoms without SI or HI, asks for start tx, declines counseling or psychiatry due to transport issue.  Fortunatley no recent falls and is able to handle her home ADLs for now  Did have a scary episode of persistent vertigo severe with n/v several times ovenight 3 days ago and improved, but could not tolerate antivert or zofran as she would thrown those up as well.         Wt Readings from Last 3 Encounters:  06/10/22 228 lb (103.4 kg)  01/22/22 233 lb 3.2 oz (105.8 kg)  12/23/21 230 lb (104.3 kg)   BP Readings from Last 3 Encounters:  06/10/22 140/72  01/22/22 (!) 147/71  12/23/21 (!) 157/58         Past Medical History:  Diagnosis Date   ALLERGIC RHINITIS 04/02/2009   no per pt   Anxiety    BACK PAIN 09/27/2008   BUNIONS, BILATERAL 12/23/2007   CHEST DISCOMFORT, ATYPICAL 11/07/2009   Chronic LBP    COLONIC POLYPS, HX OF 09/13/2007   CONSTIPATION 09/27/2008   DEPRESSION 09/13/2007   Diabetes mellitus    diet controlled   DYSPNEA ON  EXERTION 02/06/2010   Eustachian tube dysfunction 05/20/2011   FOOT PAIN, RIGHT 09/27/2008   HYPERLIPIDEMIA 03/11/2008   LBP (low back pain) 05/20/2011   Leukocytosis 11/20/2011   OSTEOARTHROSIS NOS, LOWER LEG 09/13/2007   SHOULDER PAIN, LEFT 02/14/2009   SVT (supraventricular tachycardia) ([place])    SYMPTOM, PALPITATIONS 09/13/2007   URINARY INCONTINENCE 08/23/2009   Vertigo 05/20/2011   Past Surgical History:  Procedure Laterality Date   BUNIONECTOMY     right   ccx     CHOLECYSTECTOMY     LUMBAR LAMINECTOMY     LUMBAR LAMINECTOMY     SHOULDER ARTHROSCOPY  12/13/2011   Procedure: ARTHROSCOPY SHOULDER;  Surgeon: Augustin Schooling;  Location: [facility];  Service: Orthopedics;  Laterality: Left;  Left Shoulder ArthroscopyDebridement Limited Tenodesis Open Rotator Cuff Repair Spur Removal Right Shoulder Injection     reports that she has quit smoking. Her smoking use included cigarettes. She has a 10.00 pack-year smoking history. She has never used smokeless tobacco. She reports that she does not drink alcohol and does not use drugs. family history includes Colon cancer in her mother; Diabetes in an other family member; Diabetes type II in her father; Heart disease in her brother and father. Allergies  Allergen Reactions  Aripiprazole     REACTION: agitation   Simvastatin     REACTION: myalgia   Current Outpatient Medications on File Prior to Visit  Medication Sig Dispense Refill   acetaminophen (TYLENOL) 325 MG tablet Take 2 tablets (650 mg total) by mouth every 6 (six) hours as needed for mild pain or headache.     albuterol (VENTOLIN HFA) 108 (90 Base) MCG/ACT inhaler INHALE 2 PUFFS INTO THE LUNGS EVERY 6 HOURS AS NEEDED FOR WHEEZING OR SHORTNESS OF BREATH 8.5 g 7   aspirin 81 MG EC tablet Take by mouth.     atorvastatin (LIPITOR) 80 MG tablet TAKE 1 TABLET(80 MG) BY MOUTH DAILY 90 tablet 3   budesonide-formoterol (SYMBICORT) 160-4.5 MCG/ACT inhaler Inhale 2 puffs into the lungs 2 (two) times  daily. 6 g 2   Calcium Carbonate (CALCIUM 500 PO) Take 1,000 mg by mouth 2 (two) times daily.      cetirizine (ZYRTEC) 10 MG tablet Take 1 tablet (10 mg total) by mouth daily. 90 tablet 3   ergocalciferol (VITAMIN D2) 1.25 MG (50000 UT) capsule Take 1 tablet by mouth daily.     fluticasone (FLONASE) 50 MCG/ACT nasal spray SHAKE LIQUID AND USE 2 SPRAYS IN EACH NOSTRIL DAILY 48 g 1   glucose blood (ONETOUCH ULTRA) test strip Use as instructed three times daily E11.9 300 each 12   glucose blood test strip OneTouch Verio test strips  USE AS DIRECTED THREE TIMES DAILY     levETIRAcetam (KEPPRA) 500 MG tablet Take 1 tablet (500 mg total) by mouth 2 (two) times daily. 180 tablet 1   levothyroxine (SYNTHROID) 50 MCG tablet TAKE 1 TABLET(50 MCG) BY MOUTH DAILY. NEEDS APPOINTMENT 90 tablet 3   meloxicam (MOBIC) 15 MG tablet TAKE 1 TABLET(15 MG) BY MOUTH DAILY 90 tablet 0   metFORMIN (GLUCOPHAGE) 500 MG tablet Take 1 tablet (500 mg total) by mouth 2 (two) times daily with a meal. 180 tablet 2   metoprolol succinate (TOPROL-XL) 25 MG 24 hr tablet TAKE 1 TABLET(25 MG) BY MOUTH DAILY 90 tablet 3   Multiple Vitamin (MULTIVITAMIN WITH MINERALS) TABS tablet Take 1 tablet by mouth daily.     MYRBETRIQ 50 MG TB24 tablet Take 1 tablet (50 mg total) by mouth at bedtime. 30 tablet 0   NON FORMULARY Heart healthy lcs diet     NP THYROID 30 MG tablet Take 1 tablet (30 mg total) by mouth daily. 30 tablet 0   ondansetron (ZOFRAN) 4 MG tablet Take 1 tablet (4 mg total) by mouth every 8 (eight) hours as needed for nausea or vomiting. 30 tablet 0   oxybutynin (DITROPAN-XL) 10 MG 24 hr tablet Take by mouth.     pantoprazole (PROTONIX) 40 MG tablet Take 1 tablet (40 mg total) by mouth daily. 90 tablet 3   polyethylene glycol powder (GLYCOLAX/MIRALAX) 17 GM/SCOOP powder Take 17 g by mouth 2 (two) times daily as needed. 3350 g 3   senna-docusate (SENOKOT-S) 8.6-50 MG tablet Take 1 tablet by mouth at bedtime as needed for mild  constipation.     traZODone (DESYREL) 50 MG tablet TAKE 1 TABLET(50 MG) BY MOUTH AT BEDTIME 90 tablet 1   10894 Inhale 2 puffs into the lungs 2 (two) times daily.     [address] PLACE 1 DROP INTO AFFECTED EYE QID.     No current facility-administered medications on file prior to visit.        ROS:  All others reviewed and negative.  Objective  PE:  BP 140/72   Pulse 73   Temp 97.8 F (36.6 C) (Oral)   Ht '5\' 6"'$  (1.676 m)   Wt 228 lb (103.4 kg)   SpO2 97%   BMI 36.80 kg/m                 Constitutional: Pt appears in NAD               HENT: Head: NCAT.                Right Ear: External ear normal.                 Left Ear: External ear normal.                Eyes: . Pupils are equal, round, and reactive to light. Conjunctivae and EOM are normal               Nose: without d/c or deformity               Neck: Neck supple. Gross normal ROM               Cardiovascular: Normal rate and regular rhythm.                 Pulmonary/Chest: Effort normal and breath sounds without rales or wheezing.                Abd:  Soft, NT, ND, + BS, no organomegaly               Neurological: Pt is alert. At baseline orientation, motor grossly intact               Skin: Skin is warm. No rashes, no other new lesions, LE edema - trace bilateral               Psychiatric: Pt behavior is normal without agitation , marked anxious depressed affect  Micro: none  Cardiac tracings I have personally interpreted today:  none  Pertinent Radiological findings (summarize): none   Lab Results  Component Value Date   WBC 12.2 (H) 06/10/2022   HGB 13.5 06/10/2022   HCT 40.6 06/10/2022   PLT 392.0 06/10/2022   GLUCOSE 167 (H) 06/10/2022   CHOL 140 06/10/2022   TRIG 251.0 (H) 06/10/2022   HDL 38.90 (L) 06/10/2022   LDLDIRECT 75.0 06/10/2022   LDLCALC 78 12/14/2021   ALT 24 06/10/2022   AST 28 06/10/2022   NA 136 06/10/2022   K 3.9 06/10/2022   CL 101 06/10/2022   CREATININE 0.97 06/10/2022   BUN  22 06/10/2022   CO2 20 06/10/2022   TSH 3.56 06/10/2022   INR 1.1 05/09/2020   HGBA1C 6.9 (H) 06/10/2022   MICROALBUR 0.4 12/14/2021   Assessment/Plan:  Alexandra Wolfe is a 76 y.o. White or Caucasian [1] female with  has a past medical history of ALLERGIC RHINITIS (04/02/2009), Anxiety, BACK PAIN (09/27/2008), BUNIONS, BILATERAL (12/23/2007), CHEST DISCOMFORT, ATYPICAL (11/07/2009), Chronic LBP, COLONIC POLYPS, HX OF (09/13/2007), CONSTIPATION (09/27/2008), DEPRESSION (09/13/2007), Diabetes mellitus, DYSPNEA ON EXERTION (02/06/2010), Eustachian tube dysfunction (05/20/2011), FOOT PAIN, RIGHT (09/27/2008), HYPERLIPIDEMIA (03/11/2008), LBP (low back pain) (05/20/2011), Leukocytosis (11/20/2011), OSTEOARTHROSIS NOS, LOWER LEG (09/13/2007), SHOULDER PAIN, LEFT (02/14/2009), SVT (supraventricular tachycardia) ([place]), SYMPTOM, PALPITATIONS (09/13/2007), URINARY INCONTINENCE (08/23/2009), and Vertigo (05/20/2011).  Recurrent falls With chronic bilateral knee pain followed per ortho, but needs PT - ok for in home [facility] with PT  Diabetic neuropathy ([place]) With  increased pain - for gabapentin 100 tid,  to f/u any worsening symptoms or concerns  Diabetes mellitus type II, non insulin dependent ([facility]) Lab Results  Component Value Date   HGBA1C 6.9 (H) 06/10/2022   Stable, pt to continue current medical treatment metformin 500 bid   Degenerative arthritis of knee, bilateral With chronic pain, cont to follow with ortho  Depression With recent worsening anxiety depression - for celexa 20 mg qd  Vertigo Recurring episodes, for antivert prn, and also change zofran to zofran ODT prn  Followup: No follow-ups on file.  Cathlean Cower, MD 06/11/2022 12:49 PM [facility]

## 2022-06-10 NOTE — Patient Instructions (Signed)
Please take all new medication as prescribed - the celexa for anxiety and low mood, gabapentin for leg nerve pain, and the nausea medication that melts under your tongue  Please continue all other medications as before, and refills have been done if requested.  Please have the pharmacy call with any other refills you may need.  Please continue your efforts at being more active, low cholesterol diet, and weight control.  Please keep your appointments with your specialists as you may have planned  You will be contacted regarding the referral for: [place] with Physical therapy, and Occupational Therapy and Aide if possible  Please go to the LAB at the blood drawing area for the tests to be done  You will be contacted by phone if any changes need to be made immediately.  Otherwise, you will receive a letter about your results with an explanation, but please check with MyChart first.  Please remember to sign up for MyChart if you have not done so, as this will be important to you in the future with finding out test results, communicating by private email, and scheduling acute appointments online when needed.  Please make an Appointment to return in 6 months, or sooner if needed

## 2022-06-11 ENCOUNTER — Encounter: Payer: Self-pay | Admitting: Internal Medicine

## 2022-06-11 NOTE — Assessment & Plan Note (Signed)
With increased pain - for gabapentin 100 tid,  to f/u any worsening symptoms or concerns

## 2022-06-11 NOTE — Assessment & Plan Note (Signed)
With chronic bilateral knee pain followed per ortho, but needs PT - ok for in home [facility] with PT

## 2022-06-11 NOTE — Assessment & Plan Note (Signed)
With chronic pain, cont to follow with ortho

## 2022-06-11 NOTE — Assessment & Plan Note (Signed)
Lab Results  Component Value Date   HGBA1C 6.9 (H) 06/10/2022   Stable, pt to continue current medical treatment metformin 500 bid

## 2022-06-11 NOTE — Assessment & Plan Note (Signed)
With recent worsening anxiety depression - for celexa 20 mg qd

## 2022-06-11 NOTE — Assessment & Plan Note (Signed)
Recurring episodes, for antivert prn, and also change zofran to zofran ODT prn

## 2022-06-25 ENCOUNTER — Telehealth: Payer: Self-pay

## 2022-06-25 ENCOUNTER — Telehealth: Payer: Self-pay | Admitting: Internal Medicine

## 2022-06-25 DIAGNOSIS — E1142 Type 2 diabetes mellitus with diabetic polyneuropathy: Secondary | ICD-10-CM | POA: Diagnosis not present

## 2022-06-25 DIAGNOSIS — R296 Repeated falls: Secondary | ICD-10-CM | POA: Diagnosis not present

## 2022-06-25 DIAGNOSIS — F32A Depression, unspecified: Secondary | ICD-10-CM | POA: Diagnosis not present

## 2022-06-25 DIAGNOSIS — Z79899 Other long term (current) drug therapy: Secondary | ICD-10-CM | POA: Diagnosis not present

## 2022-06-25 DIAGNOSIS — R32 Unspecified urinary incontinence: Secondary | ICD-10-CM | POA: Diagnosis not present

## 2022-06-25 DIAGNOSIS — M545 Low back pain, unspecified: Secondary | ICD-10-CM | POA: Diagnosis not present

## 2022-06-25 DIAGNOSIS — Z9181 History of falling: Secondary | ICD-10-CM | POA: Diagnosis not present

## 2022-06-25 DIAGNOSIS — Z87891 Personal history of nicotine dependence: Secondary | ICD-10-CM | POA: Diagnosis not present

## 2022-06-25 DIAGNOSIS — E559 Vitamin D deficiency, unspecified: Secondary | ICD-10-CM | POA: Diagnosis not present

## 2022-06-25 DIAGNOSIS — R42 Dizziness and giddiness: Secondary | ICD-10-CM | POA: Diagnosis not present

## 2022-06-25 DIAGNOSIS — Z7982 Long term (current) use of aspirin: Secondary | ICD-10-CM | POA: Diagnosis not present

## 2022-06-25 DIAGNOSIS — M170 Bilateral primary osteoarthritis of knee: Secondary | ICD-10-CM | POA: Diagnosis not present

## 2022-06-25 DIAGNOSIS — E785 Hyperlipidemia, unspecified: Secondary | ICD-10-CM | POA: Diagnosis not present

## 2022-06-25 DIAGNOSIS — Z7984 Long term (current) use of oral hypoglycemic drugs: Secondary | ICD-10-CM | POA: Diagnosis not present

## 2022-06-25 DIAGNOSIS — F419 Anxiety disorder, unspecified: Secondary | ICD-10-CM | POA: Diagnosis not present

## 2022-06-25 DIAGNOSIS — E538 Deficiency of other specified B group vitamins: Secondary | ICD-10-CM | POA: Diagnosis not present

## 2022-06-25 NOTE — Telephone Encounter (Signed)
Patient notified

## 2022-06-25 NOTE — Telephone Encounter (Signed)
Pt is calling requesting medication for Constipation. Pt states its been a 1.5 weeks since last bowel movement.   Pt has tried OTC 4 gummy- Doculax small chewy everyday, Smooth laxative a cap full.   Please advise   Pt CB [phone]

## 2022-06-25 NOTE — Telephone Encounter (Signed)
Ok for verbals 

## 2022-06-25 NOTE — Telephone Encounter (Signed)
One of the best things to start is also OTC - the miralax 17 gm once daily - very easy to take and tolerate and works well

## 2022-06-25 NOTE — Telephone Encounter (Signed)
Evan with [facility] calls today in need of orders for physical therapy. These orders consist of the frequency  Once a week for one week Twice a week for 3 weeks Once a week for 5 weeks  These orders can be given verbally on secure VM   CB: [phone]

## 2022-06-28 NOTE — Telephone Encounter (Signed)
Called Alexandra Wolfe no answer LMOM w/MD response.Marland KitchenJohny Wolfe

## 2022-07-05 ENCOUNTER — Telehealth: Payer: Self-pay

## 2022-07-05 NOTE — Telephone Encounter (Signed)
Erlene Quan with [facility] calling to report that pt has missed PTA visit.  Erlene Quan is also wanting to report the following concern:   **Erlene Quan states pt has said she missed her apptmnt to falling and id sore all over. Pt reported to [place] she fell on the morning of 07/05/22 and states she had a lump on her head due to hitting her head on the cabinet. No other details was given.

## 2022-07-05 NOTE — Telephone Encounter (Signed)
Ok to contact pt- consider OV for persistent pain and swelling, but should go to ED for severe pain, swelling, or any new neurological symptoms now

## 2022-07-25 DIAGNOSIS — M170 Bilateral primary osteoarthritis of knee: Secondary | ICD-10-CM | POA: Diagnosis not present

## 2022-07-25 DIAGNOSIS — Z87891 Personal history of nicotine dependence: Secondary | ICD-10-CM | POA: Diagnosis not present

## 2022-07-25 DIAGNOSIS — F32A Depression, unspecified: Secondary | ICD-10-CM | POA: Diagnosis not present

## 2022-07-25 DIAGNOSIS — E559 Vitamin D deficiency, unspecified: Secondary | ICD-10-CM | POA: Diagnosis not present

## 2022-07-25 DIAGNOSIS — Z9181 History of falling: Secondary | ICD-10-CM | POA: Diagnosis not present

## 2022-07-25 DIAGNOSIS — Z79899 Other long term (current) drug therapy: Secondary | ICD-10-CM | POA: Diagnosis not present

## 2022-07-25 DIAGNOSIS — E785 Hyperlipidemia, unspecified: Secondary | ICD-10-CM | POA: Diagnosis not present

## 2022-07-25 DIAGNOSIS — R42 Dizziness and giddiness: Secondary | ICD-10-CM | POA: Diagnosis not present

## 2022-07-25 DIAGNOSIS — R32 Unspecified urinary incontinence: Secondary | ICD-10-CM | POA: Diagnosis not present

## 2022-07-25 DIAGNOSIS — Z7982 Long term (current) use of aspirin: Secondary | ICD-10-CM | POA: Diagnosis not present

## 2022-07-25 DIAGNOSIS — Z7984 Long term (current) use of oral hypoglycemic drugs: Secondary | ICD-10-CM | POA: Diagnosis not present

## 2022-07-25 DIAGNOSIS — M545 Low back pain, unspecified: Secondary | ICD-10-CM | POA: Diagnosis not present

## 2022-07-25 DIAGNOSIS — E538 Deficiency of other specified B group vitamins: Secondary | ICD-10-CM | POA: Diagnosis not present

## 2022-07-25 DIAGNOSIS — F419 Anxiety disorder, unspecified: Secondary | ICD-10-CM | POA: Diagnosis not present

## 2022-07-25 DIAGNOSIS — E1142 Type 2 diabetes mellitus with diabetic polyneuropathy: Secondary | ICD-10-CM | POA: Diagnosis not present

## 2022-07-25 DIAGNOSIS — R296 Repeated falls: Secondary | ICD-10-CM | POA: Diagnosis not present

## 2022-08-07 ENCOUNTER — Encounter: Payer: Self-pay | Admitting: Internal Medicine

## 2022-08-07 ENCOUNTER — Ambulatory Visit (INDEPENDENT_AMBULATORY_CARE_PROVIDER_SITE_OTHER): Payer: Medicare Other | Admitting: Internal Medicine

## 2022-08-07 VITALS — BP 130/76 | HR 65 | Temp 98.3°F | Ht 66.0 in | Wt 230.0 lb

## 2022-08-07 DIAGNOSIS — J309 Allergic rhinitis, unspecified: Secondary | ICD-10-CM

## 2022-08-07 DIAGNOSIS — H6692 Otitis media, unspecified, left ear: Secondary | ICD-10-CM

## 2022-08-07 DIAGNOSIS — I10 Essential (primary) hypertension: Secondary | ICD-10-CM | POA: Diagnosis not present

## 2022-08-07 DIAGNOSIS — E119 Type 2 diabetes mellitus without complications: Secondary | ICD-10-CM

## 2022-08-07 HISTORY — DX: Otitis media, unspecified, left ear: H66.92

## 2022-08-07 MED ORDER — TRIAMCINOLONE ACETONIDE 55 MCG/ACT NA AERO: 2.0000 | INHALATION_SPRAY | Freq: Every day | NASAL | 12 refills | Status: DC

## 2022-08-07 MED ORDER — AMOXICILLIN-POT CLAVULANATE 875-125 MG PO TABS: 1.0000 | ORAL_TABLET | Freq: Two times a day (BID) | ORAL | 0 refills | Status: DC

## 2022-08-07 NOTE — Assessment & Plan Note (Signed)
Mild to mod, for antibx course - augmentin,  to f/u any worsening symptoms or concerns

## 2022-08-07 NOTE — Progress Notes (Signed)
Patient ID: Alexandra Wolfe, female   DOB: 05-27-1946, 76 y.o.   MRN: [id number]        Chief Complaint: follow up left ear pain, dm, htn       HPI:  Alexandra Wolfe is a 76 y.o. female here with c/o 3 days onset severe left ear pain with muffled hearing, but no high fever, chills, HA, ST, cough and Pt denies chest pain, increased sob or doe, wheezing, orthopnea, PND, increased LE swelling, palpitations, dizziness or syncope.   Pt denies polydipsia, polyuria, or new focal neuro s/s.  Pt denies fever, wt loss, night sweats, loss of appetite, or other constitutional symptoms  Does have several wks ongoing nasal allergy symptoms with clearish congestion, itch and sneezing, without fever, pain, ST, cough, swelling or wheezing.       Wt Readings from Last 3 Encounters:  08/07/22 230 lb (104.3 kg)  06/10/22 228 lb (103.4 kg)  01/22/22 233 lb 3.2 oz (105.8 kg)   BP Readings from Last 3 Encounters:  08/07/22 130/76  06/10/22 140/72  01/22/22 (!) 147/71         Past Medical History:  Diagnosis Date   ALLERGIC RHINITIS 04/02/2009   no per pt   Anxiety    BACK PAIN 09/27/2008   BUNIONS, BILATERAL 12/23/2007   CHEST DISCOMFORT, ATYPICAL 11/07/2009   Chronic LBP    COLONIC POLYPS, HX OF 09/13/2007   CONSTIPATION 09/27/2008   DEPRESSION 09/13/2007   Diabetes mellitus    diet controlled   DYSPNEA ON EXERTION 02/06/2010   Eustachian tube dysfunction 05/20/2011   FOOT PAIN, RIGHT 09/27/2008   HYPERLIPIDEMIA 03/11/2008   LBP (low back pain) 05/20/2011   Leukocytosis 11/20/2011   OSTEOARTHROSIS NOS, LOWER LEG 09/13/2007   SHOULDER PAIN, LEFT 02/14/2009   SVT (supraventricular tachycardia) ([name])    SYMPTOM, PALPITATIONS 09/13/2007   URINARY INCONTINENCE 08/23/2009   Vertigo 05/20/2011   Past Surgical History:  Procedure Laterality Date   BUNIONECTOMY     right   ccx     CHOLECYSTECTOMY     LUMBAR LAMINECTOMY     LUMBAR LAMINECTOMY     SHOULDER ARTHROSCOPY  12/13/2011   Procedure: ARTHROSCOPY  SHOULDER;  Surgeon: Augustin Schooling;  Location: [facility];  Service: Orthopedics;  Laterality: Left;  Left Shoulder ArthroscopyDebridement Limited Tenodesis Open Rotator Cuff Repair Spur Removal Right Shoulder Injection     reports that she has quit smoking. Her smoking use included cigarettes. She has a 10.00 pack-year smoking history. She has never used smokeless tobacco. She reports that she does not drink alcohol and does not use drugs. family history includes Colon cancer in her mother; Diabetes in an other family member; Diabetes type II in her father; Heart disease in her brother and father. Allergies  Allergen Reactions   Aripiprazole     REACTION: agitation   Simvastatin     REACTION: myalgia   Current Outpatient Medications on File Prior to Visit  Medication Sig Dispense Refill   10894 Inhale 2 puffs into the lungs 2 (two) times daily.     32355 PLACE 1 DROP INTO AFFECTED EYE QID.     acetaminophen (TYLENOL) 325 MG tablet Take 2 tablets (650 mg total) by mouth every 6 (six) hours as needed for mild pain or headache.     albuterol (VENTOLIN HFA) 108 (90 Base) MCG/ACT inhaler INHALE 2 PUFFS INTO THE LUNGS EVERY 6 HOURS AS NEEDED FOR WHEEZING OR SHORTNESS OF BREATH 8.5 g 7  aspirin 81 MG EC tablet Take by mouth.     atorvastatin (LIPITOR) 80 MG tablet TAKE 1 TABLET(80 MG) BY MOUTH DAILY 90 tablet 3   budesonide-formoterol (SYMBICORT) 160-4.5 MCG/ACT inhaler Inhale 2 puffs into the lungs 2 (two) times daily. 6 g 2   Calcium Carbonate (CALCIUM 500 PO) Take 1,000 mg by mouth 2 (two) times daily.      cetirizine (ZYRTEC) 10 MG tablet Take 1 tablet (10 mg total) by mouth daily. 90 tablet 3   citalopram (CELEXA) 20 MG tablet Take 1 tablet (20 mg total) by mouth daily. 90 tablet 3   ergocalciferol (VITAMIN D2) 1.25 MG (50000 UT) capsule Take 1 tablet by mouth daily.     fluticasone (FLONASE) 50 MCG/ACT nasal spray SHAKE LIQUID AND USE 2 SPRAYS IN EACH NOSTRIL DAILY 48 g 1   gabapentin  (NEURONTIN) 100 MG capsule Take 1 capsule (100 mg total) by mouth 3 (three) times daily. 270 capsule 1   glucose blood (ONETOUCH ULTRA) test strip Use as instructed three times daily E11.9 300 each 12   glucose blood test strip OneTouch Verio test strips  USE AS DIRECTED THREE TIMES DAILY     levETIRAcetam (KEPPRA) 500 MG tablet Take 1 tablet (500 mg total) by mouth 2 (two) times daily. 180 tablet 1   levothyroxine (SYNTHROID) 50 MCG tablet TAKE 1 TABLET(50 MCG) BY MOUTH DAILY. NEEDS APPOINTMENT 90 tablet 3   meclizine (ANTIVERT) 25 MG tablet Take 1 tablet (25 mg total) by mouth 3 (three) times daily as needed for dizziness. 90 tablet 0   meloxicam (MOBIC) 15 MG tablet TAKE 1 TABLET(15 MG) BY MOUTH DAILY 90 tablet 0   metFORMIN (GLUCOPHAGE) 500 MG tablet Take 1 tablet (500 mg total) by mouth 2 (two) times daily with a meal. 180 tablet 2   metoprolol succinate (TOPROL-XL) 25 MG 24 hr tablet TAKE 1 TABLET(25 MG) BY MOUTH DAILY 90 tablet 3   Multiple Vitamin (MULTIVITAMIN WITH MINERALS) TABS tablet Take 1 tablet by mouth daily.     MYRBETRIQ 50 MG TB24 tablet Take 1 tablet (50 mg total) by mouth at bedtime. 30 tablet 0   NON FORMULARY Heart healthy lcs diet     NP THYROID 30 MG tablet Take 1 tablet (30 mg total) by mouth daily. 30 tablet 0   ondansetron (ZOFRAN) 4 MG tablet Take 1 tablet (4 mg total) by mouth every 8 (eight) hours as needed for nausea or vomiting. 30 tablet 0   ondansetron (ZOFRAN-ODT) 4 MG disintegrating tablet Take 1 tablet (4 mg total) by mouth every 8 (eight) hours as needed for nausea or vomiting. 30 tablet 1   oxybutynin (DITROPAN-XL) 10 MG 24 hr tablet Take by mouth.     pantoprazole (PROTONIX) 40 MG tablet Take 1 tablet (40 mg total) by mouth daily. 90 tablet 3   polyethylene glycol powder (GLYCOLAX/MIRALAX) 17 GM/SCOOP powder Take 17 g by mouth 2 (two) times daily as needed. 3350 g 3   senna-docusate (SENOKOT-S) 8.6-50 MG tablet Take 1 tablet by mouth at bedtime as needed  for mild constipation.     traZODone (DESYREL) 50 MG tablet TAKE 1 TABLET(50 MG) BY MOUTH AT BEDTIME 90 tablet 1   No current facility-administered medications on file prior to visit.        ROS:  All others reviewed and negative.  Objective        PE:  BP 130/76 (BP Location: Right Arm, Patient Position: Sitting, Cuff Size: Large)  Pulse 65   Temp 98.3 F (36.8 C) (Oral)   Ht '5\' 6"'$  (1.676 m)   Wt 230 lb (104.3 kg)   SpO2 98%   BMI 37.12 kg/m                 Constitutional: Pt appears in NAD               HENT: Head: NCAT.                Right Ear: External ear normal.                 Left Ear: External ear normal. Left TM with severe erythema and mild bulging               Eyes: . Pupils are equal, round, and reactive to light. Conjunctivae and EOM are normal               Nose: without d/c or deformity               Neck: Neck supple. Gross normal ROM               Cardiovascular: Normal rate and regular rhythm.                 Pulmonary/Chest: Effort normal and breath sounds without rales or wheezing.                Abd:  Soft, NT, ND, + BS, no organomegaly               Neurological: Pt is alert. At baseline orientation, motor grossly intact               Skin: Skin is warm. No rashes, no other new lesions, LE edema - none               Psychiatric: Pt behavior is normal without agitation   Micro: none  Cardiac tracings I have personally interpreted today:  none  Pertinent Radiological findings (summarize): none   Lab Results  Component Value Date   WBC 12.2 (H) 06/10/2022   HGB 13.5 06/10/2022   HCT 40.6 06/10/2022   PLT 392.0 06/10/2022   GLUCOSE 167 (H) 06/10/2022   CHOL 140 06/10/2022   TRIG 251.0 (H) 06/10/2022   HDL 38.90 (L) 06/10/2022   LDLDIRECT 75.0 06/10/2022   LDLCALC 78 12/14/2021   ALT 24 06/10/2022   AST 28 06/10/2022   NA 136 06/10/2022   K 3.9 06/10/2022   CL 101 06/10/2022   CREATININE 0.97 06/10/2022   BUN 22 06/10/2022   CO2 20  06/10/2022   TSH 3.56 06/10/2022   INR 1.1 05/09/2020   HGBA1C 6.9 (H) 06/10/2022   MICROALBUR 0.4 12/14/2021   Assessment/Plan:  Alexandra Wolfe is a 76 y.o. White or Caucasian [1] female with  has a past medical history of ALLERGIC RHINITIS (04/02/2009), Anxiety, BACK PAIN (09/27/2008), BUNIONS, BILATERAL (12/23/2007), CHEST DISCOMFORT, ATYPICAL (11/07/2009), Chronic LBP, COLONIC POLYPS, HX OF (09/13/2007), CONSTIPATION (09/27/2008), DEPRESSION (09/13/2007), Diabetes mellitus, DYSPNEA ON EXERTION (02/06/2010), Eustachian tube dysfunction (05/20/2011), FOOT PAIN, RIGHT (09/27/2008), HYPERLIPIDEMIA (03/11/2008), LBP (low back pain) (05/20/2011), Leukocytosis (11/20/2011), OSTEOARTHROSIS NOS, LOWER LEG (09/13/2007), SHOULDER PAIN, LEFT (02/14/2009), SVT (supraventricular tachycardia) ([place]), SYMPTOM, PALPITATIONS (09/13/2007), URINARY INCONTINENCE (08/23/2009), and Vertigo (05/20/2011).  Diabetes mellitus type II, non insulin dependent ([place]) Lab Results  Component Value Date   HGBA1C 6.9 (H) 06/10/2022   uncontrolled, pt to continue current medical treatment metformin  500 mg 2 bid   Essential hypertension BP Readings from Last 3 Encounters:  08/07/22 130/76  06/10/22 140/72  01/22/22 (!) 147/71   Stable, pt to continue medical treatment toprol xl 25 mg qd   Left otitis media Mild to mod, for antibx course - augmentin,  to f/u any worsening symptoms or concerns  Allergic rhinitis Also for otc nasacort asd  Followup: Return if symptoms worsen or fail to improve.  Cathlean Cower, MD 08/07/2022 7:30 PM [facility]

## 2022-08-07 NOTE — Patient Instructions (Signed)
Please take all new medication as prescribed- the antibiotic  Please take all new medication as recommended - the OTC Nasacort for allergies as well  Please continue all other medications as before, including the Cetirizine  Please have the pharmacy call with any other refills you may need.  Please continue your efforts at being more active, low cholesterol diet, and weight control.  Please keep your appointments with your specialists as you may have planned

## 2022-08-07 NOTE — Assessment & Plan Note (Signed)
Lab Results  Component Value Date   HGBA1C 6.9 (H) 06/10/2022   uncontrolled, pt to continue current medical treatment metformin 500 mg 2 bid

## 2022-08-07 NOTE — Assessment & Plan Note (Signed)
BP Readings from Last 3 Encounters:  08/07/22 130/76  06/10/22 140/72  01/22/22 (!) 147/71   Stable, pt to continue medical treatment toprol xl 25 mg qd

## 2022-08-07 NOTE — Assessment & Plan Note (Signed)
Also for otc nasacort asd

## 2022-08-20 ENCOUNTER — Ambulatory Visit: Payer: Medicare Other | Admitting: Internal Medicine

## 2022-08-21 ENCOUNTER — Encounter: Payer: Self-pay | Admitting: Internal Medicine

## 2022-08-21 ENCOUNTER — Ambulatory Visit (INDEPENDENT_AMBULATORY_CARE_PROVIDER_SITE_OTHER): Payer: Medicare Other | Admitting: Internal Medicine

## 2022-08-21 VITALS — BP 128/68 | HR 65 | Temp 97.7°F

## 2022-08-21 DIAGNOSIS — H6692 Otitis media, unspecified, left ear: Secondary | ICD-10-CM | POA: Diagnosis not present

## 2022-08-21 DIAGNOSIS — Z1211 Encounter for screening for malignant neoplasm of colon: Secondary | ICD-10-CM | POA: Diagnosis not present

## 2022-08-21 DIAGNOSIS — Z23 Encounter for immunization: Secondary | ICD-10-CM

## 2022-08-21 DIAGNOSIS — I10 Essential (primary) hypertension: Secondary | ICD-10-CM | POA: Diagnosis not present

## 2022-08-21 DIAGNOSIS — J309 Allergic rhinitis, unspecified: Secondary | ICD-10-CM | POA: Diagnosis not present

## 2022-08-21 DIAGNOSIS — R1310 Dysphagia, unspecified: Secondary | ICD-10-CM

## 2022-08-21 MED ORDER — GUAIFENESIN ER 600 MG PO TB12: 1200.0000 mg | ORAL_TABLET | Freq: Two times a day (BID) | ORAL | 1 refills | Status: DC | PRN

## 2022-08-21 MED ORDER — PREDNISONE 10 MG PO TABS: ORAL_TABLET | ORAL | 0 refills | Status: DC

## 2022-08-21 MED ORDER — METHYLPREDNISOLONE ACETATE 80 MG/ML IJ SUSP
80.0000 mg | Freq: Once | INTRAMUSCULAR | Status: AC
  Administered 2022-08-21: 80 mg via INTRAMUSCULAR

## 2022-08-21 MED ORDER — AZITHROMYCIN 250 MG PO TABS: ORAL_TABLET | ORAL | 1 refills | Status: AC

## 2022-08-21 NOTE — Progress Notes (Signed)
Patient ID: Alexandra Wolfe, female   DOB: 01/10/1946, 76 y.o.   MRN: [id number]        Chief Complaint: follow up HTN, left ear pain, allergies       HPI:  Alexandra Wolfe is a 76 y.o. female here with c/o  left sinus and ear pain with feverish x 3 days with pressure, headache, general weakness and malaise, and greenish d/c, with mild ST and cough, but pt denies chest pain, wheezing, increased sob or doe, orthopnea, PND, increased LE swelling, palpitations, dizziness or syncope.  Does have several wks ongoing nasal allergy symptoms with clearish congestion, itch and sneezing, without fever, pain, ST, cough, swelling or wheezing.  Does also have significant dysphagia but no cough, wheezing, or aspiration or impaction.  Has been offered EGD before and declined, does same today but just wanted to mention it.  Wt stable overall   Due for cologuard, flu shot. Wt Readings from Last 3 Encounters:  08/07/22 230 lb (104.3 kg)  06/10/22 228 lb (103.4 kg)  01/22/22 233 lb 3.2 oz (105.8 kg)   BP Readings from Last 3 Encounters:  08/21/22 128/68  08/07/22 130/76  06/10/22 140/72         Past Medical History:  Diagnosis Date   ALLERGIC RHINITIS 04/02/2009   no per pt   Anxiety    BACK PAIN 09/27/2008   BUNIONS, BILATERAL 12/23/2007   CHEST DISCOMFORT, ATYPICAL 11/07/2009   Chronic LBP    COLONIC POLYPS, HX OF 09/13/2007   CONSTIPATION 09/27/2008   DEPRESSION 09/13/2007   Diabetes mellitus    diet controlled   DYSPNEA ON EXERTION 02/06/2010   Eustachian tube dysfunction 05/20/2011   FOOT PAIN, RIGHT 09/27/2008   HYPERLIPIDEMIA 03/11/2008   LBP (low back pain) 05/20/2011   Leukocytosis 11/20/2011   OSTEOARTHROSIS NOS, LOWER LEG 09/13/2007   SHOULDER PAIN, LEFT 02/14/2009   SVT (supraventricular tachycardia) ([place])    SYMPTOM, PALPITATIONS 09/13/2007   URINARY INCONTINENCE 08/23/2009   Vertigo 05/20/2011   Past Surgical History:  Procedure Laterality Date   BUNIONECTOMY     right   ccx      CHOLECYSTECTOMY     LUMBAR LAMINECTOMY     LUMBAR LAMINECTOMY     SHOULDER ARTHROSCOPY  12/13/2011   Procedure: ARTHROSCOPY SHOULDER;  Surgeon: Augustin Schooling;  Location: [facility];  Service: Orthopedics;  Laterality: Left;  Left Shoulder ArthroscopyDebridement Limited Tenodesis Open Rotator Cuff Repair Spur Removal Right Shoulder Injection     reports that she has quit smoking. Her smoking use included cigarettes. She has a 10.00 pack-year smoking history. She has never used smokeless tobacco. She reports that she does not drink alcohol and does not use drugs. family history includes Colon cancer in her mother; Diabetes in an other family member; Diabetes type II in her father; Heart disease in her brother and father. Allergies  Allergen Reactions   Aripiprazole     REACTION: agitation   Simvastatin     REACTION: myalgia   Current Outpatient Medications on File Prior to Visit  Medication Sig Dispense Refill   10894 Inhale 2 puffs into the lungs 2 (two) times daily.     35361 PLACE 1 DROP INTO AFFECTED EYE QID.     acetaminophen (TYLENOL) 325 MG tablet Take 2 tablets (650 mg total) by mouth every 6 (six) hours as needed for mild pain or headache.     albuterol (VENTOLIN HFA) 108 (90 Base) MCG/ACT inhaler INHALE 2 PUFFS INTO THE  LUNGS EVERY 6 HOURS AS NEEDED FOR WHEEZING OR SHORTNESS OF BREATH 8.5 g 7   aspirin 81 MG EC tablet Take by mouth.     atorvastatin (LIPITOR) 80 MG tablet TAKE 1 TABLET(80 MG) BY MOUTH DAILY 90 tablet 3   budesonide-formoterol (SYMBICORT) 160-4.5 MCG/ACT inhaler Inhale 2 puffs into the lungs 2 (two) times daily. 6 g 2   Calcium Carbonate (CALCIUM 500 PO) Take 1,000 mg by mouth 2 (two) times daily.      cetirizine (ZYRTEC) 10 MG tablet Take 1 tablet (10 mg total) by mouth daily. 90 tablet 3   citalopram (CELEXA) 20 MG tablet Take 1 tablet (20 mg total) by mouth daily. 90 tablet 3   ergocalciferol (VITAMIN D2) 1.25 MG (50000 UT) capsule Take 1 tablet by mouth daily.      fluticasone (FLONASE) 50 MCG/ACT nasal spray SHAKE LIQUID AND USE 2 SPRAYS IN EACH NOSTRIL DAILY 48 g 1   gabapentin (NEURONTIN) 100 MG capsule Take 1 capsule (100 mg total) by mouth 3 (three) times daily. 270 capsule 1   glucose blood (ONETOUCH ULTRA) test strip Use as instructed three times daily E11.9 300 each 12   glucose blood test strip OneTouch Verio test strips  USE AS DIRECTED THREE TIMES DAILY     levETIRAcetam (KEPPRA) 500 MG tablet Take 1 tablet (500 mg total) by mouth 2 (two) times daily. 180 tablet 1   levothyroxine (SYNTHROID) 50 MCG tablet TAKE 1 TABLET(50 MCG) BY MOUTH DAILY. NEEDS APPOINTMENT 90 tablet 3   meclizine (ANTIVERT) 25 MG tablet Take 1 tablet (25 mg total) by mouth 3 (three) times daily as needed for dizziness. 90 tablet 0   meloxicam (MOBIC) 15 MG tablet TAKE 1 TABLET(15 MG) BY MOUTH DAILY 90 tablet 0   metFORMIN (GLUCOPHAGE) 500 MG tablet Take 1 tablet (500 mg total) by mouth 2 (two) times daily with a meal. 180 tablet 2   metoprolol succinate (TOPROL-XL) 25 MG 24 hr tablet TAKE 1 TABLET(25 MG) BY MOUTH DAILY 90 tablet 3   Multiple Vitamin (MULTIVITAMIN WITH MINERALS) TABS tablet Take 1 tablet by mouth daily.     MYRBETRIQ 50 MG TB24 tablet Take 1 tablet (50 mg total) by mouth at bedtime. 30 tablet 0   NON FORMULARY Heart healthy lcs diet     NP THYROID 30 MG tablet Take 1 tablet (30 mg total) by mouth daily. 30 tablet 0   ondansetron (ZOFRAN) 4 MG tablet Take 1 tablet (4 mg total) by mouth every 8 (eight) hours as needed for nausea or vomiting. 30 tablet 0   ondansetron (ZOFRAN-ODT) 4 MG disintegrating tablet Take 1 tablet (4 mg total) by mouth every 8 (eight) hours as needed for nausea or vomiting. 30 tablet 1   oxybutynin (DITROPAN-XL) 10 MG 24 hr tablet Take by mouth.     pantoprazole (PROTONIX) 40 MG tablet Take 1 tablet (40 mg total) by mouth daily. 90 tablet 3   polyethylene glycol powder (GLYCOLAX/MIRALAX) 17 GM/SCOOP powder Take 17 g by mouth 2 (two) times  daily as needed. 3350 g 3   senna-docusate (SENOKOT-S) 8.6-50 MG tablet Take 1 tablet by mouth at bedtime as needed for mild constipation.     traZODone (DESYREL) 50 MG tablet TAKE 1 TABLET(50 MG) BY MOUTH AT BEDTIME 90 tablet 1   triamcinolone (NASACORT) 55 MCG/ACT AERO nasal inhaler Place 2 sprays into the nose daily. 1 each 12   No current facility-administered medications on file prior to visit.  ROS:  All others reviewed and negative.  Objective        PE:  BP 128/68   Pulse 65   Temp 97.7 F (36.5 C)   SpO2 98%                 Constitutional: Pt appears in NAD               HENT: Head: NCAT.                Right Ear: External ear normal.                 Left Ear: External ear normal. Bilat tm's with mod erythema left > right,.  Max sinus areas mild tender.  Pharynx with mild erythema, no exudate               Eyes: . Pupils are equal, round, and reactive to light. Conjunctivae and EOM are normal               Nose: without d/c or deformity               Neck: Neck supple. Gross normal ROM               Cardiovascular: Normal rate and regular rhythm.                 Pulmonary/Chest: Effort normal and breath sounds without rales or wheezing.                Abd:  Soft, NT, ND, + BS, no organomegaly               Neurological: Pt is alert. At baseline orientation, motor grossly intact               Skin: Skin is warm. No rashes, no other new lesions, LE edema  none               Psychiatric: Pt behavior is normal without agitation   Micro: none  Cardiac tracings I have personally interpreted today:  none  Pertinent Radiological findings (summarize): none   Lab Results  Component Value Date   WBC 12.2 (H) 06/10/2022   HGB 13.5 06/10/2022   HCT 40.6 06/10/2022   PLT 392.0 06/10/2022   GLUCOSE 167 (H) 06/10/2022   CHOL 140 06/10/2022   TRIG 251.0 (H) 06/10/2022   HDL 38.90 (L) 06/10/2022   LDLDIRECT 75.0 06/10/2022   LDLCALC 78 12/14/2021   ALT 24 06/10/2022    AST 28 06/10/2022   NA 136 06/10/2022   K 3.9 06/10/2022   CL 101 06/10/2022   CREATININE 0.97 06/10/2022   BUN 22 06/10/2022   CO2 20 06/10/2022   TSH 3.56 06/10/2022   INR 1.1 05/09/2020   HGBA1C 6.9 (H) 06/10/2022   MICROALBUR 0.4 12/14/2021   Assessment/Plan:  Alexandra Wolfe is a 76 y.o. White or Caucasian [1] female with  has a past medical history of ALLERGIC RHINITIS (04/02/2009), Anxiety, BACK PAIN (09/27/2008), BUNIONS, BILATERAL (12/23/2007), CHEST DISCOMFORT, ATYPICAL (11/07/2009), Chronic LBP, COLONIC POLYPS, HX OF (09/13/2007), CONSTIPATION (09/27/2008), DEPRESSION (09/13/2007), Diabetes mellitus, DYSPNEA ON EXERTION (02/06/2010), Eustachian tube dysfunction (05/20/2011), FOOT PAIN, RIGHT (09/27/2008), HYPERLIPIDEMIA (03/11/2008), LBP (low back pain) (05/20/2011), Leukocytosis (11/20/2011), OSTEOARTHROSIS NOS, LOWER LEG (09/13/2007), SHOULDER PAIN, LEFT (02/14/2009), SVT (supraventricular tachycardia) ([place]), SYMPTOM, PALPITATIONS (09/13/2007), URINARY INCONTINENCE (08/23/2009), and Vertigo (05/20/2011).  Left otitis media Mild to mod, for antibx course,  to f/u any worsening symptoms  or concerns  Allergic rhinitis With recent seasonal flare, for prednisone taper, depomedrol I'm 80 gm x 1,  to f/u any worsening symptoms or concerns  Dysphagia Worsening subjectively recently in severity to solids, after d/w pt she declines GI referral for now, fearful of the EGD procedure per pt  Essential hypertension BP Readings from Last 3 Encounters:  08/21/22 128/68  08/07/22 130/76  06/10/22 140/72   Stable, pt to continue medical treatment toprol xl 25 mg qd  Followup: Return if symptoms worsen or fail to improve.  Cathlean Cower, MD 08/24/2022 8:14 PM [facility]

## 2022-08-21 NOTE — Patient Instructions (Signed)
You had the steroid shot today  You had the flu shot today  Please take all new medication as prescribed - the antibiotic, prednisone, and mucinex twice per day as needed  Please continue all other medications as before, and refills have been done if requested.  Please have the pharmacy call with any other refills you may need.  Please keep your appointments with your specialists as you may have planned  Please call if you change your mind about referral to Gastroenterology  You will be contacted regarding the referral for: Cologuard

## 2022-08-22 ENCOUNTER — Telehealth: Payer: Self-pay | Admitting: Internal Medicine

## 2022-08-22 NOTE — Telephone Encounter (Signed)
Please advise for verbal orders for [facility] PT once a week for 8 weeks.

## 2022-08-22 NOTE — Telephone Encounter (Signed)
Left message for Alexandra Wolfe confirming verbal orders

## 2022-08-22 NOTE — Telephone Encounter (Signed)
Ok for verbals 

## 2022-08-22 NOTE — Telephone Encounter (Signed)
Alexandra Wolfe calls today with [facility] in regards to needing orders for PT. They are wanting to continue [facility] physical therapy service for Once a week for 8 weeks  These orders can be given verbally and the VM is secure. CB: [phone]  Anitra Lauth also wanted to let Dr.John know that when reviewing PT's medication list, they had stated they were no longer taking the levETIRAcetam (KEPPRA) 500 MG.

## 2022-08-24 ENCOUNTER — Encounter: Payer: Self-pay | Admitting: Internal Medicine

## 2022-08-24 DIAGNOSIS — R42 Dizziness and giddiness: Secondary | ICD-10-CM | POA: Diagnosis not present

## 2022-08-24 DIAGNOSIS — Z7984 Long term (current) use of oral hypoglycemic drugs: Secondary | ICD-10-CM | POA: Diagnosis not present

## 2022-08-24 DIAGNOSIS — R296 Repeated falls: Secondary | ICD-10-CM | POA: Diagnosis not present

## 2022-08-24 DIAGNOSIS — M170 Bilateral primary osteoarthritis of knee: Secondary | ICD-10-CM | POA: Diagnosis not present

## 2022-08-24 DIAGNOSIS — F419 Anxiety disorder, unspecified: Secondary | ICD-10-CM | POA: Diagnosis not present

## 2022-08-24 DIAGNOSIS — E1142 Type 2 diabetes mellitus with diabetic polyneuropathy: Secondary | ICD-10-CM | POA: Diagnosis not present

## 2022-08-24 DIAGNOSIS — Z79899 Other long term (current) drug therapy: Secondary | ICD-10-CM | POA: Diagnosis not present

## 2022-08-24 DIAGNOSIS — F32A Depression, unspecified: Secondary | ICD-10-CM | POA: Diagnosis not present

## 2022-08-24 DIAGNOSIS — E538 Deficiency of other specified B group vitamins: Secondary | ICD-10-CM | POA: Diagnosis not present

## 2022-08-24 DIAGNOSIS — R32 Unspecified urinary incontinence: Secondary | ICD-10-CM | POA: Diagnosis not present

## 2022-08-24 DIAGNOSIS — E559 Vitamin D deficiency, unspecified: Secondary | ICD-10-CM | POA: Diagnosis not present

## 2022-08-24 DIAGNOSIS — Z87891 Personal history of nicotine dependence: Secondary | ICD-10-CM | POA: Diagnosis not present

## 2022-08-24 DIAGNOSIS — E785 Hyperlipidemia, unspecified: Secondary | ICD-10-CM | POA: Diagnosis not present

## 2022-08-24 DIAGNOSIS — Z9181 History of falling: Secondary | ICD-10-CM | POA: Diagnosis not present

## 2022-08-24 DIAGNOSIS — M545 Low back pain, unspecified: Secondary | ICD-10-CM | POA: Diagnosis not present

## 2022-08-24 DIAGNOSIS — Z7982 Long term (current) use of aspirin: Secondary | ICD-10-CM | POA: Diagnosis not present

## 2022-08-24 NOTE — Assessment & Plan Note (Signed)
Mild to mod, for antibx course,  to f/u any worsening symptoms or concerns 

## 2022-08-24 NOTE — Assessment & Plan Note (Addendum)
Worsening subjectively recently in severity to solids, after d/w pt she declines GI referral for now, fearful of the EGD procedure per pt

## 2022-08-24 NOTE — Assessment & Plan Note (Addendum)
With recent seasonal flare, for prednisone taper, depomedrol I'm 80 gm x 1,  to f/u any worsening symptoms or concerns

## 2022-08-24 NOTE — Assessment & Plan Note (Signed)
BP Readings from Last 3 Encounters:  08/21/22 128/68  08/07/22 130/76  06/10/22 140/72   Stable, pt to continue medical treatment toprol xl 25 mg qd

## 2022-09-02 DIAGNOSIS — Z1211 Encounter for screening for malignant neoplasm of colon: Secondary | ICD-10-CM | POA: Diagnosis not present

## 2022-09-08 LAB — COLOGUARD: COLOGUARD: NEGATIVE

## 2022-09-09 ENCOUNTER — Ambulatory Visit: Payer: Self-pay | Admitting: Licensed Clinical Social Worker

## 2022-09-09 NOTE — Patient Instructions (Signed)
Visit Information  Thank you for taking time to visit with me today. Please don't hesitate to contact me if I can be of assistance to you before our next scheduled telephone appointment.  Following are the goals we discussed today:   No further intervention is needed at this time  Please call the care guide team at [phone] if you need to cancel or reschedule your appointment.   If you are experiencing a Mental Health or [place] or need someone to talk to, please go to [facility] [phone])   Following is a copy of your full plan of care:   Care Coordination:   Informed client about Care Coordination program support Talked with client about her receiving physical therapy session one time weekly in the home Talked with client about support from her PCP Dr. Biagio Borg Encouraged client to talk with PCP about Care Coordination program support  Ms. Mee was given information about Care Management services by the embedded care coordination team including:  Care Management services include personalized support from designated clinical staff supervised by her physician, including individualized plan of care and coordination with other care providers 24/7 contact phone numbers for assistance for urgent and routine care needs. The patient may stop CCM services at any time (effective at the end of the month) by phone call to the office staff.  Patient agreed to services and verbal consent obtained.   Norva Riffle.Haizel Gatchell MSW, [place] [employer] representative [facility] Care Management [phone]

## 2022-09-09 NOTE — Patient Outreach (Signed)
  Care Coordination   Initial Visit Note   09/09/2022 Name: Alexandra Wolfe MRN: [id number] DOB: 06-17-1946  Alexandra Wolfe is a 76 y.o. year old female who sees Biagio Borg, MD for primary care. I spoke with  Arlester Marker by phone today.  What matters to the patients health and wellness today? Client said she receives physical therapy session one time weekly in her home    Goals Addressed               This Visit's Progress     Patient Stated she receives physical therapy session one time weekly in home (pt-stated)        Care Coordination:   Informed client about Care Coordination program support Talked with client about her receiving physical therapy session one time weekly in the home Talked with client about support from her PCP Dr. Biagio Borg Encouraged client to talk with PCP about Care Coordination program support    SDOH assessments and interventions completed:  Yes  SDOH Interventions Today    Flowsheet Row Most Recent Value  SDOH Interventions   Depression Interventions/Treatment  Counseling  Physical Activity Interventions Other (Comments)  [receives physical therapy in home environment one time weekly]        Care Coordination Interventions Activated:  Yes  Care Coordination Interventions:  Yes, provided   Follow up plan: No further intervention required.   Encounter Outcome:  Pt. Visit Completed

## 2022-09-23 DIAGNOSIS — Z87891 Personal history of nicotine dependence: Secondary | ICD-10-CM | POA: Diagnosis not present

## 2022-09-23 DIAGNOSIS — Z79899 Other long term (current) drug therapy: Secondary | ICD-10-CM | POA: Diagnosis not present

## 2022-09-23 DIAGNOSIS — E1142 Type 2 diabetes mellitus with diabetic polyneuropathy: Secondary | ICD-10-CM | POA: Diagnosis not present

## 2022-09-23 DIAGNOSIS — E559 Vitamin D deficiency, unspecified: Secondary | ICD-10-CM | POA: Diagnosis not present

## 2022-09-23 DIAGNOSIS — E785 Hyperlipidemia, unspecified: Secondary | ICD-10-CM | POA: Diagnosis not present

## 2022-09-23 DIAGNOSIS — M545 Low back pain, unspecified: Secondary | ICD-10-CM | POA: Diagnosis not present

## 2022-09-23 DIAGNOSIS — E538 Deficiency of other specified B group vitamins: Secondary | ICD-10-CM | POA: Diagnosis not present

## 2022-09-23 DIAGNOSIS — F32A Depression, unspecified: Secondary | ICD-10-CM | POA: Diagnosis not present

## 2022-09-23 DIAGNOSIS — Z7982 Long term (current) use of aspirin: Secondary | ICD-10-CM | POA: Diagnosis not present

## 2022-09-23 DIAGNOSIS — Z7984 Long term (current) use of oral hypoglycemic drugs: Secondary | ICD-10-CM | POA: Diagnosis not present

## 2022-09-23 DIAGNOSIS — R32 Unspecified urinary incontinence: Secondary | ICD-10-CM | POA: Diagnosis not present

## 2022-09-23 DIAGNOSIS — R296 Repeated falls: Secondary | ICD-10-CM | POA: Diagnosis not present

## 2022-09-23 DIAGNOSIS — R42 Dizziness and giddiness: Secondary | ICD-10-CM | POA: Diagnosis not present

## 2022-09-23 DIAGNOSIS — M170 Bilateral primary osteoarthritis of knee: Secondary | ICD-10-CM | POA: Diagnosis not present

## 2022-09-23 DIAGNOSIS — F419 Anxiety disorder, unspecified: Secondary | ICD-10-CM | POA: Diagnosis not present

## 2022-09-23 DIAGNOSIS — Z9181 History of falling: Secondary | ICD-10-CM | POA: Diagnosis not present

## 2022-09-24 ENCOUNTER — Telehealth: Payer: Self-pay | Admitting: Internal Medicine

## 2022-09-24 MED ORDER — AZITHROMYCIN 250 MG PO TABS: ORAL_TABLET | ORAL | 1 refills | Status: AC

## 2022-09-24 NOTE — Telephone Encounter (Signed)
See below

## 2022-09-24 NOTE — Telephone Encounter (Signed)
Patient does not have transportation and her ear is still bother her from last month.  She wants to know if you would send in an antibiotic for her.  If so she would like it sent to [facility].

## 2022-09-24 NOTE — Telephone Encounter (Signed)
Ok I sent zpack to [facility], thanks

## 2022-09-24 NOTE — Telephone Encounter (Signed)
Patient informed. 

## 2022-09-24 NOTE — Telephone Encounter (Signed)
Please advise, LOV 08/21/22.

## 2022-10-21 ENCOUNTER — Telehealth: Payer: Self-pay

## 2022-10-21 NOTE — Telephone Encounter (Signed)
Pt was on [facility] list of calls but based on pt chart, pt never admitted to ED or hospital

## 2022-10-24 DIAGNOSIS — N3946 Mixed incontinence: Secondary | ICD-10-CM | POA: Diagnosis not present

## 2022-11-06 ENCOUNTER — Telehealth: Payer: Self-pay | Admitting: Internal Medicine

## 2022-11-06 ENCOUNTER — Other Ambulatory Visit: Payer: Self-pay

## 2022-11-06 DIAGNOSIS — F5101 Primary insomnia: Secondary | ICD-10-CM

## 2022-11-06 MED ORDER — TRAZODONE HCL 50 MG PO TABS: ORAL_TABLET | ORAL | 1 refills | Status: DC

## 2022-11-06 NOTE — Telephone Encounter (Signed)
Rx request sent to pharmacy, patient notified.

## 2022-11-06 NOTE — Telephone Encounter (Signed)
Patient needs a refill on her trazadone - she said it worked well - Please send to [facility] on [address]

## 2022-11-13 ENCOUNTER — Telehealth: Payer: Self-pay | Admitting: Internal Medicine

## 2022-11-13 NOTE — Telephone Encounter (Signed)
Patient called and said that she would like to take Dr. Jenny Reichmann up on his offer of getting her home health - She would like help 4 hours a day.  Please advise when complete  Patient# [phone]  Patient would like to use [facility] - phone# [phone]

## 2022-11-13 NOTE — Telephone Encounter (Signed)
Needs OV as this is required up to 30 days prior to the order, thanks

## 2022-11-15 NOTE — Telephone Encounter (Signed)
Per chart pt made appt for 12/12/22.Marland KitchenJohny Wolfe

## 2022-11-19 NOTE — Telephone Encounter (Signed)
Open in error

## 2022-11-22 ENCOUNTER — Ambulatory Visit: Payer: Medicare Other | Admitting: Internal Medicine

## 2022-11-26 ENCOUNTER — Ambulatory Visit (INDEPENDENT_AMBULATORY_CARE_PROVIDER_SITE_OTHER): Payer: Medicare Other | Admitting: Internal Medicine

## 2022-11-26 ENCOUNTER — Encounter: Payer: Self-pay | Admitting: Internal Medicine

## 2022-11-26 VITALS — BP 126/74 | HR 83 | Temp 97.8°F | Ht 66.0 in | Wt 220.0 lb

## 2022-11-26 DIAGNOSIS — I10 Essential (primary) hypertension: Secondary | ICD-10-CM | POA: Diagnosis not present

## 2022-11-26 DIAGNOSIS — R4189 Other symptoms and signs involving cognitive functions and awareness: Secondary | ICD-10-CM

## 2022-11-26 DIAGNOSIS — E7800 Pure hypercholesterolemia, unspecified: Secondary | ICD-10-CM

## 2022-11-26 DIAGNOSIS — R29818 Other symptoms and signs involving the nervous system: Secondary | ICD-10-CM | POA: Diagnosis not present

## 2022-11-26 DIAGNOSIS — E538 Deficiency of other specified B group vitamins: Secondary | ICD-10-CM

## 2022-11-26 DIAGNOSIS — E119 Type 2 diabetes mellitus without complications: Secondary | ICD-10-CM | POA: Diagnosis not present

## 2022-11-26 DIAGNOSIS — E559 Vitamin D deficiency, unspecified: Secondary | ICD-10-CM

## 2022-11-26 DIAGNOSIS — F419 Anxiety disorder, unspecified: Secondary | ICD-10-CM | POA: Insufficient documentation

## 2022-11-26 DIAGNOSIS — S069XAS Unspecified intracranial injury with loss of consciousness status unknown, sequela: Secondary | ICD-10-CM

## 2022-11-26 LAB — BASIC METABOLIC PANEL
BUN: 18 mg/dL (ref 6–23)
CO2: 22 mEq/L (ref 19–32)
Calcium: 10.4 mg/dL (ref 8.4–10.5)
Chloride: 101 mEq/L (ref 96–112)
Creatinine, Ser: 0.77 mg/dL (ref 0.40–1.20)
GFR: 74.57 mL/min (ref >60.00)
Glucose, Bld: 117 mg/dL — ABNORMAL HIGH (ref 70–99)
Potassium: 4.1 mEq/L (ref 3.5–5.1)
Sodium: 136 mEq/L (ref 135–145)

## 2022-11-26 LAB — HEPATIC FUNCTION PANEL
ALT: 22 U/L (ref 0–35)
AST: 24 U/L (ref 0–37)
Albumin: 4.7 g/dL (ref 3.5–5.2)
Alkaline Phosphatase: 115 U/L (ref 39–117)
Bilirubin, Direct: 0.1 mg/dL (ref 0.0–0.3)
Total Bilirubin: 0.5 mg/dL (ref 0.2–1.2)
Total Protein: 7.5 g/dL (ref 6.0–8.3)

## 2022-11-26 LAB — CBC WITH DIFFERENTIAL/PLATELET
Basophils Absolute: 0.1 10*3/uL (ref 0.0–0.1)
Basophils Relative: 0.9 % (ref 0.0–3.0)
Eosinophils Absolute: 0.3 10*3/uL (ref 0.0–0.7)
Eosinophils Relative: 2.1 % (ref 0.0–5.0)
HCT: 40.2 % (ref 36.0–46.0)
Hemoglobin: 13.5 g/dL (ref 12.0–15.0)
Lymphocytes Relative: 32.1 % (ref 12.0–46.0)
Lymphs Abs: 4.7 10*3/uL — ABNORMAL HIGH (ref 0.7–4.0)
MCHC: 33.5 g/dL (ref 30.0–36.0)
MCV: 86 fl (ref 78.0–100.0)
Monocytes Absolute: 0.8 10*3/uL (ref 0.1–1.0)
Monocytes Relative: 5.7 % (ref 3.0–12.0)
Neutro Abs: 8.7 10*3/uL — ABNORMAL HIGH (ref 1.4–7.7)
Neutrophils Relative %: 59.2 % (ref 43.0–77.0)
Platelets: 391 10*3/uL (ref 150.0–400.0)
RBC: 4.67 Mil/uL (ref 3.87–5.11)
RDW: 14.9 % (ref 11.5–15.5)
WBC: 14.8 10*3/uL — ABNORMAL HIGH (ref 4.0–10.5)

## 2022-11-26 LAB — VITAMIN D 25 HYDROXY (VIT D DEFICIENCY, FRACTURES): VITD: 34.83 ng/mL (ref 30.00–100.00)

## 2022-11-26 LAB — LIPID PANEL
Cholesterol: 147 mg/dL (ref 0–200)
HDL: 44.2 mg/dL (ref >39.00)
LDL Cholesterol: 71 mg/dL (ref 0–99)
NonHDL: 102.94
Total CHOL/HDL Ratio: 3
Triglycerides: 160 mg/dL — ABNORMAL HIGH (ref 0.0–149.0)
VLDL: 32 mg/dL (ref 0.0–40.0)

## 2022-11-26 LAB — VITAMIN B12: Vitamin B-12: 642 pg/mL (ref 211–911)

## 2022-11-26 LAB — TSH: TSH: 2.7 u[IU]/mL (ref 0.35–5.50)

## 2022-11-26 MED ORDER — CITALOPRAM HYDROBROMIDE 40 MG PO TABS: 40.0000 mg | ORAL_TABLET | Freq: Every day | ORAL | 3 refills | Status: DC

## 2022-11-26 MED ORDER — PANTOPRAZOLE SODIUM 40 MG PO TBEC: 40.0000 mg | DELAYED_RELEASE_TABLET | Freq: Every day | ORAL | 3 refills | Status: DC

## 2022-11-26 MED ORDER — ONETOUCH ULTRA VI STRP: ORAL_STRIP | 12 refills | Status: DC

## 2022-11-26 NOTE — Assessment & Plan Note (Signed)
BP Readings from Last 3 Encounters:  11/26/22 126/74  08/21/22 128/68  08/07/22 130/76   Stable, pt to continue medical treatment toprol xl 25 mg qd

## 2022-11-26 NOTE — Progress Notes (Signed)
Patient ID: Alexandra Wolfe, female   DOB: 1946-10-05, 76 y.o.   MRN: [id number]        Chief Complaint: follow up with son with TBI with neurocognitive impairment, bilateral knee DJD pain, anxiety, reflux       HPI:  MARELLA VANDERPOL is a 76 y.o. female here overall doing ok, but living alone and now with worsening bilateral knee pain and swelling, unable to stand enough to keep up the house.  Pt denies chest pain, increased sob or doe, wheezing, orthopnea, PND, increased LE swelling, palpitations, dizziness or syncope.   Pt denies polydipsia, polyuria, or new focal neuro s/s.    Pt denies fever, wt loss, night sweats, loss of appetite, or other constitutional symptoms  Denies worsening depressive symptoms, suicidal ideation, or panic; has ongoing anxiety, worsening recently.  Denies worsening reflux, abd pain, dysphagia, n/v, bowel change or blood.       Wt Readings from Last 3 Encounters:  11/26/22 220 lb (99.8 kg)  08/07/22 230 lb (104.3 kg)  06/10/22 228 lb (103.4 kg)   BP Readings from Last 3 Encounters:  11/26/22 126/74  08/21/22 128/68  08/07/22 130/76         Past Medical History:  Diagnosis Date   ALLERGIC RHINITIS 04/02/2009   no per pt   Anxiety    BACK PAIN 09/27/2008   BUNIONS, BILATERAL 12/23/2007   CHEST DISCOMFORT, ATYPICAL 11/07/2009   Chronic LBP    COLONIC POLYPS, HX OF 09/13/2007   CONSTIPATION 09/27/2008   DEPRESSION 09/13/2007   Diabetes mellitus    diet controlled   DYSPNEA ON EXERTION 02/06/2010   Eustachian tube dysfunction 05/20/2011   FOOT PAIN, RIGHT 09/27/2008   HYPERLIPIDEMIA 03/11/2008   LBP (low back pain) 05/20/2011   Leukocytosis 11/20/2011   OSTEOARTHROSIS NOS, LOWER LEG 09/13/2007   SHOULDER PAIN, LEFT 02/14/2009   SVT (supraventricular tachycardia)    SYMPTOM, PALPITATIONS 09/13/2007   URINARY INCONTINENCE 08/23/2009   Vertigo 05/20/2011   Past Surgical History:  Procedure Laterality Date   BUNIONECTOMY     right   ccx     CHOLECYSTECTOMY      LUMBAR LAMINECTOMY     LUMBAR LAMINECTOMY     SHOULDER ARTHROSCOPY  12/13/2011   Procedure: ARTHROSCOPY SHOULDER;  Surgeon: Augustin Schooling;  Location: [place];  Service: Orthopedics;  Laterality: Left;  Left Shoulder ArthroscopyDebridement Limited Tenodesis Open Rotator Cuff Repair Spur Removal Right Shoulder Injection     reports that she has quit smoking. Her smoking use included cigarettes. She has a 10.00 pack-year smoking history. She has never used smokeless tobacco. She reports that she does not drink alcohol and does not use drugs. family history includes Colon cancer in her mother; Diabetes in an other family member; Diabetes type II in her father; Heart disease in her brother and father. Allergies  Allergen Reactions   Aripiprazole     REACTION: agitation   Simvastatin     REACTION: myalgia   Current Outpatient Medications on File Prior to Visit  Medication Sig Dispense Refill   10894 Inhale 2 puffs into the lungs 2 (two) times daily.     89211 PLACE 1 DROP INTO AFFECTED EYE QID.     acetaminophen (TYLENOL) 325 MG tablet Take 2 tablets (650 mg total) by mouth every 6 (six) hours as needed for mild pain or headache.     albuterol (VENTOLIN HFA) 108 (90 Base) MCG/ACT inhaler INHALE 2 PUFFS INTO THE LUNGS EVERY 6 HOURS  AS NEEDED FOR WHEEZING OR SHORTNESS OF BREATH 8.5 g 7   aspirin 81 MG EC tablet Take by mouth.     atorvastatin (LIPITOR) 80 MG tablet TAKE 1 TABLET(80 MG) BY MOUTH DAILY 90 tablet 3   budesonide-formoterol (SYMBICORT) 160-4.5 MCG/ACT inhaler Inhale 2 puffs into the lungs 2 (two) times daily. 6 g 2   Calcium Carbonate (CALCIUM 500 PO) Take 1,000 mg by mouth 2 (two) times daily.      cetirizine (ZYRTEC) 10 MG tablet Take 1 tablet (10 mg total) by mouth daily. 90 tablet 3   ergocalciferol (VITAMIN D2) 1.25 MG (50000 UT) capsule Take 1 tablet by mouth daily.     fluticasone (FLONASE) 50 MCG/ACT nasal spray SHAKE LIQUID AND USE 2 SPRAYS IN EACH NOSTRIL DAILY 48 g 1    gabapentin (NEURONTIN) 100 MG capsule Take 1 capsule (100 mg total) by mouth 3 (three) times daily. 270 capsule 1   glucose blood test strip OneTouch Verio test strips  USE AS DIRECTED THREE TIMES DAILY     guaiFENesin (MUCINEX) 600 MG 12 hr tablet Take 2 tablets (1,200 mg total) by mouth 2 (two) times daily as needed. 60 tablet 1   levETIRAcetam (KEPPRA) 500 MG tablet Take 1 tablet (500 mg total) by mouth 2 (two) times daily. 180 tablet 1   levothyroxine (SYNTHROID) 50 MCG tablet TAKE 1 TABLET(50 MCG) BY MOUTH DAILY. NEEDS APPOINTMENT 90 tablet 3   meclizine (ANTIVERT) 25 MG tablet Take 1 tablet (25 mg total) by mouth 3 (three) times daily as needed for dizziness. 90 tablet 0   meloxicam (MOBIC) 15 MG tablet TAKE 1 TABLET(15 MG) BY MOUTH DAILY 90 tablet 0   metFORMIN (GLUCOPHAGE) 500 MG tablet Take 1 tablet (500 mg total) by mouth 2 (two) times daily with a meal. 180 tablet 2   metoprolol succinate (TOPROL-XL) 25 MG 24 hr tablet TAKE 1 TABLET(25 MG) BY MOUTH DAILY 90 tablet 3   Multiple Vitamin (MULTIVITAMIN WITH MINERALS) TABS tablet Take 1 tablet by mouth daily.     MYRBETRIQ 50 MG TB24 tablet Take 1 tablet (50 mg total) by mouth at bedtime. 30 tablet 0   NON FORMULARY Heart healthy lcs diet     NP THYROID 30 MG tablet Take 1 tablet (30 mg total) by mouth daily. 30 tablet 0   ondansetron (ZOFRAN) 4 MG tablet Take 1 tablet (4 mg total) by mouth every 8 (eight) hours as needed for nausea or vomiting. 30 tablet 0   ondansetron (ZOFRAN-ODT) 4 MG disintegrating tablet Take 1 tablet (4 mg total) by mouth every 8 (eight) hours as needed for nausea or vomiting. 30 tablet 1   oxybutynin (DITROPAN-XL) 10 MG 24 hr tablet Take by mouth.     polyethylene glycol powder (GLYCOLAX/MIRALAX) 17 GM/SCOOP powder Take 17 g by mouth 2 (two) times daily as needed. 3350 g 3   predniSONE (DELTASONE) 10 MG tablet 3 tabs by mouth per day for 3 days,2tabs per day for 3 days,1tab per day for 3 days 18 tablet 0    senna-docusate (SENOKOT-S) 8.6-50 MG tablet Take 1 tablet by mouth at bedtime as needed for mild constipation.     traZODone (DESYREL) 50 MG tablet TAKE 1 TABLET(50 MG) BY MOUTH AT BEDTIME 90 tablet 1   triamcinolone (NASACORT) 55 MCG/ACT AERO nasal inhaler Place 2 sprays into the nose daily. 1 each 12   trospium (SANCTURA) 20 MG tablet Take 20 mg by mouth at bedtime.  No current facility-administered medications on file prior to visit.        ROS:  All others reviewed and negative.  Objective        PE:  BP 126/74 (BP Location: Left Arm, Patient Position: Sitting, Cuff Size: Large)   Pulse 83   Temp 97.8 F (36.6 C) (Other (Comment))   Ht '5\' 6"'$  (1.676 m)   Wt 220 lb (99.8 kg)   SpO2 98%   BMI 35.51 kg/m                 Constitutional: Pt appears in NAD               HENT: Head: NCAT.                Right Ear: External ear normal.                 Left Ear: External ear normal.                Eyes: . Pupils are equal, round, and reactive to light. Conjunctivae and EOM are normal               Nose: without d/c or deformity               Neck: Neck supple. Gross normal ROM               Cardiovascular: Normal rate and regular rhythm.                 Pulmonary/Chest: Effort normal and breath sounds without rales or wheezing.                Abd:  Soft, NT, ND, + BS, no organomegaly               Neurological: Pt is alert. At baseline orientation, motor grossly intact               Skin: Skin is warm. No rashes, no other new lesions, LE edema - none               Psychiatric: Pt behavior is normal without agitation   Micro: none  Cardiac tracings I have personally interpreted today:  none  Pertinent Radiological findings (summarize): none   Lab Results  Component Value Date   WBC 12.2 (H) 06/10/2022   HGB 13.5 06/10/2022   HCT 40.6 06/10/2022   PLT 392.0 06/10/2022   GLUCOSE 167 (H) 06/10/2022   CHOL 140 06/10/2022   TRIG 251.0 (H) 06/10/2022   HDL 38.90 (L) 06/10/2022    LDLDIRECT 75.0 06/10/2022   LDLCALC 78 12/14/2021   ALT 24 06/10/2022   AST 28 06/10/2022   NA 136 06/10/2022   K 3.9 06/10/2022   CL 101 06/10/2022   CREATININE 0.97 06/10/2022   BUN 22 06/10/2022   CO2 20 06/10/2022   TSH 3.56 06/10/2022   INR 1.1 05/09/2020   HGBA1C 6.9 (H) 06/10/2022   MICROALBUR 0.4 12/14/2021   Assessment/Plan:  JACARI KIRSTEN is a 76 y.o. White or Caucasian [1] female with  has a past medical history of ALLERGIC RHINITIS (04/02/2009), Anxiety, BACK PAIN (09/27/2008), BUNIONS, BILATERAL (12/23/2007), CHEST DISCOMFORT, ATYPICAL (11/07/2009), Chronic LBP, COLONIC POLYPS, HX OF (09/13/2007), CONSTIPATION (09/27/2008), DEPRESSION (09/13/2007), Diabetes mellitus, DYSPNEA ON EXERTION (02/06/2010), Eustachian tube dysfunction (05/20/2011), FOOT PAIN, RIGHT (09/27/2008), HYPERLIPIDEMIA (03/11/2008), LBP (low back pain) (05/20/2011), Leukocytosis (11/20/2011), OSTEOARTHROSIS NOS, LOWER LEG (09/13/2007), SHOULDER PAIN, LEFT (02/14/2009), SVT (supraventricular  tachycardia), SYMPTOM, PALPITATIONS (09/13/2007), URINARY INCONTINENCE (08/23/2009), and Vertigo (05/20/2011).  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, stable  TBI (traumatic brain injury) ([place]) Also for [facility] with PT, RN and aide  Diabetes mellitus type II, non insulin dependent ([place]) Lab Results  Component Value Date   HGBA1C 6.9 (H) 06/10/2022   Stable, pt to continue current medical treatment metformin 500 bid, for f/u A1c with labs   Essential hypertension BP Readings from Last 3 Encounters:  11/26/22 126/74  08/21/22 128/68  08/07/22 130/76   Stable, pt to continue medical treatment toprol xl 25 mg qd   Hyperlipidemia Lab Results  Component Value Date   LDLCALC 78 12/14/2021   Uncontrolled, goal ldl < 70,, pt to continue current statin liptior 80 mg and lower chol diet, declines add zetia for now   [place] for increased celexa to 40 mg , declines referral  Followup: Return in about 4  months (around 03/28/2023).  Cathlean Cower, MD 11/26/2022 8:10 PM [facility]

## 2022-11-26 NOTE — Assessment & Plan Note (Signed)
[place] for increased celexa to 40 mg , declines referral

## 2022-11-26 NOTE — Patient Instructions (Signed)
Ok to increase the citalopram to 40 mg per day  Please continue all other medications as before, and refills have been done if requested - the strips Please have the pharmacy call with any other refills you may need.  Please continue your efforts at being more active, low cholesterol diet, and weight control.  You are otherwise up to date with prevention measures today.  Please keep your appointments with your specialists as you may have planned  You will be contacted regarding the referral for: Home Health  Please go to the LAB at the blood drawing area for the tests to be done  You will be contacted by phone if any changes need to be made immediately.  Otherwise, you will receive a letter about your results with an explanation, but please check with MyChart first.  Please remember to sign up for MyChart if you have not done so, as this will be important to you in the future with finding out test results, communicating by private email, and scheduling acute appointments online when needed.  Please make an Appointment to return in 4 months, or sooner if needed (ok to cancel the Jan 2024 appt)

## 2022-11-26 NOTE — Assessment & Plan Note (Signed)
Lab Results  Component Value Date   LDLCALC 78 12/14/2021   Uncontrolled, goal ldl < 70,, pt to continue current statin liptior 80 mg and lower chol diet, declines add zetia for now

## 2022-11-26 NOTE — Assessment & Plan Note (Signed)
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

## 2022-11-26 NOTE — Assessment & Plan Note (Signed)
Also for [facility] with PT, RN and aide

## 2022-11-26 NOTE — Assessment & Plan Note (Signed)
Lab Results  Component Value Date   HGBA1C 6.9 (H) 06/10/2022   Stable, pt to continue current medical treatment metformin 500 bid, for f/u A1c with labs

## 2022-11-27 ENCOUNTER — Telehealth: Payer: Self-pay | Admitting: Internal Medicine

## 2022-11-27 ENCOUNTER — Encounter: Payer: Self-pay | Admitting: Internal Medicine

## 2022-11-27 LAB — HEMOGLOBIN A1C: Hgb A1c MFr Bld: 6.7 % — ABNORMAL HIGH (ref 4.6–6.5)

## 2022-11-27 MED ORDER — GABAPENTIN 100 MG PO CAPS: 100.0000 mg | ORAL_CAPSULE | Freq: Three times a day (TID) | ORAL | 1 refills | Status: DC

## 2022-11-27 NOTE — Telephone Encounter (Signed)
Done erx 

## 2022-11-27 NOTE — Telephone Encounter (Signed)
Caller & Relationship to patient:  self   Call back number:[phone]   Date of last office visit:  11/26/2022   Date of next office visit:   Medication(s) to be refilled:  gabapentin        Preferred Pharmacy:  [facility] in [place] on [address]

## 2022-12-03 NOTE — Progress Notes (Unsigned)
   I, Peterson Lombard, LAT, ATC acting as a scribe for [name], MD.  Subjective:    CC: Bilat knee pain  HPI: Pt is a 76 y/o female c/o bilat knee pain x/. Pt suffered a fall on 12/22/21 and was seen at the [facility] ED the next day.  Patient locates pain to?  Knee swelling: Mechanical symptoms: Aggravates: Treatments tried:  Dx imaging: 12/23/21 R & L knee XR  03/02/20 R & L knee XR  Pertinent review of Systems: ***  Relevant historical information: ***   Objective:   There were no vitals filed for this visit. General: Well Developed, well nourished, and in no acute distress.   MSK: ***  Lab and Radiology Results No results found for this or any previous visit (from the past 72 hour(s)). No results found.    Impression and Recommendations:    Assessment and Plan: 77 y.o. female with ***.  PDMP not reviewed this encounter. No orders of the defined types were placed in this encounter.  No orders of the defined types were placed in this encounter.   Discussed warning signs or symptoms. Please see discharge instructions. Patient expresses understanding.   ***

## 2022-12-04 ENCOUNTER — Ambulatory Visit: Payer: Medicare Other | Admitting: Family Medicine

## 2022-12-04 ENCOUNTER — Ambulatory Visit: Payer: Self-pay

## 2022-12-04 ENCOUNTER — Ambulatory Visit (INDEPENDENT_AMBULATORY_CARE_PROVIDER_SITE_OTHER): Payer: Medicare Other

## 2022-12-04 VITALS — BP 162/88 | HR 90 | Ht 66.0 in

## 2022-12-04 DIAGNOSIS — M25561 Pain in right knee: Secondary | ICD-10-CM | POA: Diagnosis not present

## 2022-12-04 DIAGNOSIS — M25512 Pain in left shoulder: Secondary | ICD-10-CM | POA: Diagnosis not present

## 2022-12-04 DIAGNOSIS — M19011 Primary osteoarthritis, right shoulder: Secondary | ICD-10-CM | POA: Diagnosis not present

## 2022-12-04 DIAGNOSIS — R296 Repeated falls: Secondary | ICD-10-CM | POA: Diagnosis not present

## 2022-12-04 DIAGNOSIS — M25511 Pain in right shoulder: Secondary | ICD-10-CM | POA: Diagnosis not present

## 2022-12-04 DIAGNOSIS — G8929 Other chronic pain: Secondary | ICD-10-CM

## 2022-12-04 DIAGNOSIS — M1712 Unilateral primary osteoarthritis, left knee: Secondary | ICD-10-CM | POA: Diagnosis not present

## 2022-12-04 DIAGNOSIS — M25562 Pain in left knee: Secondary | ICD-10-CM

## 2022-12-04 DIAGNOSIS — Z9181 History of falling: Secondary | ICD-10-CM

## 2022-12-04 DIAGNOSIS — M25461 Effusion, right knee: Secondary | ICD-10-CM | POA: Diagnosis not present

## 2022-12-04 DIAGNOSIS — M19012 Primary osteoarthritis, left shoulder: Secondary | ICD-10-CM | POA: Diagnosis not present

## 2022-12-04 NOTE — Patient Instructions (Addendum)
Thank you for coming in today.   Please get an Xray today before you leave   You received an injection today. Seek immediate medical attention if the joint becomes red, extremely painful, or is oozing fluid.   Recheck in 2 weeks.   Use your walker.

## 2022-12-05 NOTE — Progress Notes (Signed)
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

## 2022-12-05 NOTE — Progress Notes (Signed)
Right knee x-ray shows medium to severe arthritis changes.

## 2022-12-05 NOTE — Progress Notes (Signed)
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

## 2022-12-05 NOTE — Progress Notes (Signed)
Left knee x-ray shows medium to severe arthritis changes.

## 2022-12-10 DIAGNOSIS — Z87891 Personal history of nicotine dependence: Secondary | ICD-10-CM | POA: Diagnosis not present

## 2022-12-10 DIAGNOSIS — E538 Deficiency of other specified B group vitamins: Secondary | ICD-10-CM | POA: Diagnosis not present

## 2022-12-10 DIAGNOSIS — Z7984 Long term (current) use of oral hypoglycemic drugs: Secondary | ICD-10-CM | POA: Diagnosis not present

## 2022-12-10 DIAGNOSIS — K219 Gastro-esophageal reflux disease without esophagitis: Secondary | ICD-10-CM | POA: Diagnosis not present

## 2022-12-10 DIAGNOSIS — E119 Type 2 diabetes mellitus without complications: Secondary | ICD-10-CM | POA: Diagnosis not present

## 2022-12-10 DIAGNOSIS — Z79899 Other long term (current) drug therapy: Secondary | ICD-10-CM | POA: Diagnosis not present

## 2022-12-10 DIAGNOSIS — E7800 Pure hypercholesterolemia, unspecified: Secondary | ICD-10-CM | POA: Diagnosis not present

## 2022-12-10 DIAGNOSIS — S069XAS Unspecified intracranial injury with loss of consciousness status unknown, sequela: Secondary | ICD-10-CM | POA: Diagnosis not present

## 2022-12-10 DIAGNOSIS — F419 Anxiety disorder, unspecified: Secondary | ICD-10-CM | POA: Diagnosis not present

## 2022-12-10 DIAGNOSIS — M170 Bilateral primary osteoarthritis of knee: Secondary | ICD-10-CM | POA: Diagnosis not present

## 2022-12-10 DIAGNOSIS — Z7951 Long term (current) use of inhaled steroids: Secondary | ICD-10-CM | POA: Diagnosis not present

## 2022-12-10 DIAGNOSIS — R41844 Frontal lobe and executive function deficit: Secondary | ICD-10-CM | POA: Diagnosis not present

## 2022-12-10 DIAGNOSIS — E559 Vitamin D deficiency, unspecified: Secondary | ICD-10-CM | POA: Diagnosis not present

## 2022-12-10 DIAGNOSIS — Z7982 Long term (current) use of aspirin: Secondary | ICD-10-CM | POA: Diagnosis not present

## 2022-12-10 DIAGNOSIS — Z9181 History of falling: Secondary | ICD-10-CM | POA: Diagnosis not present

## 2022-12-10 DIAGNOSIS — I10 Essential (primary) hypertension: Secondary | ICD-10-CM | POA: Diagnosis not present

## 2022-12-11 ENCOUNTER — Ambulatory Visit: Payer: Medicare Other | Admitting: Internal Medicine

## 2022-12-12 DIAGNOSIS — I10 Essential (primary) hypertension: Secondary | ICD-10-CM | POA: Diagnosis not present

## 2022-12-12 DIAGNOSIS — E119 Type 2 diabetes mellitus without complications: Secondary | ICD-10-CM | POA: Diagnosis not present

## 2022-12-12 DIAGNOSIS — K219 Gastro-esophageal reflux disease without esophagitis: Secondary | ICD-10-CM | POA: Diagnosis not present

## 2022-12-12 DIAGNOSIS — E538 Deficiency of other specified B group vitamins: Secondary | ICD-10-CM | POA: Diagnosis not present

## 2022-12-12 DIAGNOSIS — Z7982 Long term (current) use of aspirin: Secondary | ICD-10-CM | POA: Diagnosis not present

## 2022-12-12 DIAGNOSIS — Z9181 History of falling: Secondary | ICD-10-CM | POA: Diagnosis not present

## 2022-12-12 DIAGNOSIS — R41844 Frontal lobe and executive function deficit: Secondary | ICD-10-CM | POA: Diagnosis not present

## 2022-12-12 DIAGNOSIS — Z87891 Personal history of nicotine dependence: Secondary | ICD-10-CM | POA: Diagnosis not present

## 2022-12-12 DIAGNOSIS — Z7984 Long term (current) use of oral hypoglycemic drugs: Secondary | ICD-10-CM | POA: Diagnosis not present

## 2022-12-12 DIAGNOSIS — Z79899 Other long term (current) drug therapy: Secondary | ICD-10-CM | POA: Diagnosis not present

## 2022-12-12 DIAGNOSIS — F419 Anxiety disorder, unspecified: Secondary | ICD-10-CM | POA: Diagnosis not present

## 2022-12-12 DIAGNOSIS — E559 Vitamin D deficiency, unspecified: Secondary | ICD-10-CM | POA: Diagnosis not present

## 2022-12-12 DIAGNOSIS — M170 Bilateral primary osteoarthritis of knee: Secondary | ICD-10-CM | POA: Diagnosis not present

## 2022-12-12 DIAGNOSIS — E7800 Pure hypercholesterolemia, unspecified: Secondary | ICD-10-CM | POA: Diagnosis not present

## 2022-12-12 DIAGNOSIS — S069XAS Unspecified intracranial injury with loss of consciousness status unknown, sequela: Secondary | ICD-10-CM | POA: Diagnosis not present

## 2022-12-12 DIAGNOSIS — Z7951 Long term (current) use of inhaled steroids: Secondary | ICD-10-CM | POA: Diagnosis not present

## 2022-12-17 DIAGNOSIS — M170 Bilateral primary osteoarthritis of knee: Secondary | ICD-10-CM | POA: Diagnosis not present

## 2022-12-17 DIAGNOSIS — F419 Anxiety disorder, unspecified: Secondary | ICD-10-CM | POA: Diagnosis not present

## 2022-12-17 DIAGNOSIS — Z7984 Long term (current) use of oral hypoglycemic drugs: Secondary | ICD-10-CM | POA: Diagnosis not present

## 2022-12-17 DIAGNOSIS — Z7951 Long term (current) use of inhaled steroids: Secondary | ICD-10-CM | POA: Diagnosis not present

## 2022-12-17 DIAGNOSIS — R41844 Frontal lobe and executive function deficit: Secondary | ICD-10-CM | POA: Diagnosis not present

## 2022-12-17 DIAGNOSIS — K219 Gastro-esophageal reflux disease without esophagitis: Secondary | ICD-10-CM | POA: Diagnosis not present

## 2022-12-17 DIAGNOSIS — E119 Type 2 diabetes mellitus without complications: Secondary | ICD-10-CM | POA: Diagnosis not present

## 2022-12-17 DIAGNOSIS — E538 Deficiency of other specified B group vitamins: Secondary | ICD-10-CM | POA: Diagnosis not present

## 2022-12-17 DIAGNOSIS — S069XAS Unspecified intracranial injury with loss of consciousness status unknown, sequela: Secondary | ICD-10-CM | POA: Diagnosis not present

## 2022-12-17 DIAGNOSIS — Z79899 Other long term (current) drug therapy: Secondary | ICD-10-CM | POA: Diagnosis not present

## 2022-12-17 DIAGNOSIS — Z87891 Personal history of nicotine dependence: Secondary | ICD-10-CM | POA: Diagnosis not present

## 2022-12-17 DIAGNOSIS — I10 Essential (primary) hypertension: Secondary | ICD-10-CM | POA: Diagnosis not present

## 2022-12-17 DIAGNOSIS — Z7982 Long term (current) use of aspirin: Secondary | ICD-10-CM | POA: Diagnosis not present

## 2022-12-17 DIAGNOSIS — E559 Vitamin D deficiency, unspecified: Secondary | ICD-10-CM | POA: Diagnosis not present

## 2022-12-17 DIAGNOSIS — E7800 Pure hypercholesterolemia, unspecified: Secondary | ICD-10-CM | POA: Diagnosis not present

## 2022-12-17 DIAGNOSIS — Z9181 History of falling: Secondary | ICD-10-CM | POA: Diagnosis not present

## 2022-12-18 ENCOUNTER — Ambulatory Visit: Payer: BLUE CROSS/BLUE SHIELD | Admitting: Family Medicine

## 2022-12-18 ENCOUNTER — Ambulatory Visit: Payer: Self-pay

## 2022-12-18 VITALS — BP 138/86 | HR 75 | Ht 66.0 in

## 2022-12-18 DIAGNOSIS — M25512 Pain in left shoulder: Secondary | ICD-10-CM | POA: Diagnosis not present

## 2022-12-18 DIAGNOSIS — M25561 Pain in right knee: Secondary | ICD-10-CM | POA: Diagnosis not present

## 2022-12-18 DIAGNOSIS — G8929 Other chronic pain: Secondary | ICD-10-CM

## 2022-12-18 DIAGNOSIS — M25562 Pain in left knee: Secondary | ICD-10-CM

## 2022-12-18 DIAGNOSIS — M25511 Pain in right shoulder: Secondary | ICD-10-CM

## 2022-12-18 NOTE — Patient Instructions (Signed)
Thank you for coming in today.   Return at the end of March.   We can do knee injections again then.   If the knees are not doing ok before then let me know ahead of time and we can get gel shots ready.

## 2022-12-18 NOTE — Progress Notes (Signed)
I, Peterson Lombard, LAT, ATC acting as a scribe for [name], MD.  Alexandra Wolfe is a 77 y.o. female who presents to [facility] today for bilat shoulder pain. Pt was last seen by Dr. Georgina Snell on 12/04/22 for bilat knee pain and was given bilat knee steroid injections and was advised to cont home health PT. Today, pt presents w/ bilat shoulder pain, L>R, that's chronic in nature. Pt locates pain to the superior aspect of the L shoulder. Pt notes hx of L shoulder surgery.   Pt notes that bilat knees are pretty sore today. Pt stood for awhile in the bathroom this morning. Pt feel overall that her knees are feeling pretty good.   Aggravates: shoulder flex, aBd,  Radiates: yes- L arm  Dx imaging: 12/23/21 R & L knee XR             03/02/20 R & L knee XR  Pertinent review of systems: No fevers or chills  Relevant historical information: Hypertension.  Diabetes.  History of traumatic brain injury with neurocognitive defects.   Exam:  BP 138/86   Pulse 75   Ht '5\' 6"'$  (1.676 m)   SpO2 98%   BMI 35.51 kg/m  General: Elderly appearing woman seated in a wheelchair.  No acute distress.  MSK: shoulders bilaterally decreased range of motion. Decreased strength abduction and external rotation.   Lab and Radiology Results  Procedure: Real-time Ultrasound Guided Injection of left shoulder glenohumeral joint posterior approach Device: Philips Affiniti 50G Images permanently stored and available for review in PACS Verbal informed consent obtained.  Discussed risks and benefits of procedure. Warned about infection, bleeding, hyperglycemia damage to structures among others. Patient expresses understanding and agreement Time-out conducted.   Noted no overlying erythema, induration, or other signs of local infection.   Skin prepped in a sterile fashion.   Local anesthesia: Topical Ethyl chloride.   With sterile technique and under real time ultrasound guidance: 40  mg of Kenalog and 2 mL of Marcaine injected into glenohumeral joint. Fluid seen entering the glenohumeral joint.   Needle was repositioned prior to the injection. Due to the significant degenerative changes in her shoulder she does not have a typical appearance of a posterior glenohumeral joint and this was a challenging injection.  If she does not get good resolution of symptoms would consider repeat injection. Advised to call if fevers/chills, erythema, induration, drainage, or persistent bleeding.   Images permanently stored and available for review in the ultrasound unit.  Impression: Technically successful ultrasound guided injection.    Procedure: Real-time Ultrasound Guided Injection of right shoulder glenohumeral joint posterior approach Device: Philips Affiniti 50G Images permanently stored and available for review in PACS Verbal informed consent obtained.  Discussed risks and benefits of procedure. Warned about infection, bleeding, hyperglycemia damage to structures among others. Patient expresses understanding and agreement Time-out conducted.   Noted no overlying erythema, induration, or other signs of local infection.   Skin prepped in a sterile fashion.   Local anesthesia: Topical Ethyl chloride.   With sterile technique and under real time ultrasound guidance: 40 mg of Kenalog and 2 mL of Marcaine injected into glenohumeral joint. Fluid seen entering the joint capsule.   Completed without difficulty   Pain immediately resolved suggesting accurate placement of the medication.   Advised to call if fevers/chills, erythema, induration, drainage, or persistent bleeding.   Images permanently stored and available for review in the ultrasound unit.  Impression: Technically  successful ultrasound guided injection.     EXAM: RIGHT SHOULDER - 2+ VIEW   COMPARISON:  Right shoulder radiographs 02/29/2020   FINDINGS: Severe glenohumeral joint space narrowing with bone-on-bone  contact on axillary view. Mild glenoid fossa and medial humeral head cortical flattening/remodeling. Large inferior humeral head-neck junction degenerative osteophytes. There is a small ossicle versus chronic fragmentation of the superior glenoid seen at the superior aspect of the glenohumeral joint on frontal view, similar to prior.   No acute fracture is seen. No dislocation. The visualized portion of the right lung is unremarkable.   IMPRESSION: Severe glenohumeral osteoarthritis, similar to prior.     Electronically Signed   By: Yvonne Kendall M.D.   On: 12/04/2022 15:17    EXAM: LEFT SHOULDER - 2+ VIEW   COMPARISON:  Left shoulder radiographs 12/23/2021   FINDINGS: Severe glenohumeral joint space narrowing with large inferior humeral head-neck junction and moderate-to-large peripheral glenoid degenerative osteophytes, similar to prior. There are again two ossicles inferior to the glenohumeral joint on frontal view, measuring up to approximately 9 mm and 6 mm, respectively. Moderate superior humeral head degenerative spurring directed towards the subacromial space, which is narrowed. Mild lateral downsloping of the acromion. No acute fracture or dislocation. Moderate atherosclerotic calcifications within the aortic arch.   IMPRESSION: 1. Severe glenohumeral osteoarthritis, similar to prior. 2. Two ossicles inferior to the glenohumeral joint again may represent intra-articular bodies.     Electronically Signed   By: Yvonne Kendall M.D.   On: 12/04/2022 15:15   I, [name], personally (independently) visualized and performed the interpretation of the images attached in this note.   Assessment and Plan: 77 y.o. female with bilateral shoulder pain due to significant glenohumeral DJD.  She is not a good surgical candidate for total shoulder replacement.  Plan for trial of glenohumeral injection.  The left shoulder was technically challenging and if she does not have  good resolution of symptoms I would consider repeat injection sooner than 3 months.  Plan on rechecking in late March.  That would be about 3 months after her knee injections that occurred about 2 weeks ago.  We could repeat the knees then and start planning the shoulders at that time as well.  If her knee pain returns sooner we talked about potential hyaluronic acid or even Zilretta injection.  This would require authorization sooner if needed.  PDMP not reviewed this encounter. Orders Placed This Encounter  Procedures   [place] LIMITED JOINT SPACE STRUCTURES UP BILAT(NO LINKED CHARGES)    Order Specific Question:   Reason for Exam (SYMPTOM  OR DIAGNOSIS REQUIRED)    Answer:   bilateral shoulder pain    Order Specific Question:   Preferred imaging location?    Answer:   [place]   No orders of the defined types were placed in this encounter.    Discussed warning signs or symptoms. Please see discharge instructions. Patient expresses understanding.   The above documentation has been reviewed and is accurate and complete [name], M.D.

## 2022-12-19 DIAGNOSIS — Z7951 Long term (current) use of inhaled steroids: Secondary | ICD-10-CM | POA: Diagnosis not present

## 2022-12-19 DIAGNOSIS — E559 Vitamin D deficiency, unspecified: Secondary | ICD-10-CM | POA: Diagnosis not present

## 2022-12-19 DIAGNOSIS — E119 Type 2 diabetes mellitus without complications: Secondary | ICD-10-CM | POA: Diagnosis not present

## 2022-12-19 DIAGNOSIS — Z7984 Long term (current) use of oral hypoglycemic drugs: Secondary | ICD-10-CM | POA: Diagnosis not present

## 2022-12-19 DIAGNOSIS — M170 Bilateral primary osteoarthritis of knee: Secondary | ICD-10-CM | POA: Diagnosis not present

## 2022-12-19 DIAGNOSIS — Z87891 Personal history of nicotine dependence: Secondary | ICD-10-CM | POA: Diagnosis not present

## 2022-12-19 DIAGNOSIS — Z7982 Long term (current) use of aspirin: Secondary | ICD-10-CM | POA: Diagnosis not present

## 2022-12-19 DIAGNOSIS — Z9181 History of falling: Secondary | ICD-10-CM | POA: Diagnosis not present

## 2022-12-19 DIAGNOSIS — F419 Anxiety disorder, unspecified: Secondary | ICD-10-CM | POA: Diagnosis not present

## 2022-12-19 DIAGNOSIS — K219 Gastro-esophageal reflux disease without esophagitis: Secondary | ICD-10-CM | POA: Diagnosis not present

## 2022-12-19 DIAGNOSIS — Z79899 Other long term (current) drug therapy: Secondary | ICD-10-CM | POA: Diagnosis not present

## 2022-12-19 DIAGNOSIS — E538 Deficiency of other specified B group vitamins: Secondary | ICD-10-CM | POA: Diagnosis not present

## 2022-12-19 DIAGNOSIS — E7800 Pure hypercholesterolemia, unspecified: Secondary | ICD-10-CM | POA: Diagnosis not present

## 2022-12-19 DIAGNOSIS — R41844 Frontal lobe and executive function deficit: Secondary | ICD-10-CM | POA: Diagnosis not present

## 2022-12-19 DIAGNOSIS — S069XAS Unspecified intracranial injury with loss of consciousness status unknown, sequela: Secondary | ICD-10-CM | POA: Diagnosis not present

## 2022-12-19 DIAGNOSIS — I10 Essential (primary) hypertension: Secondary | ICD-10-CM | POA: Diagnosis not present

## 2022-12-23 DIAGNOSIS — Z9181 History of falling: Secondary | ICD-10-CM | POA: Diagnosis not present

## 2022-12-23 DIAGNOSIS — I10 Essential (primary) hypertension: Secondary | ICD-10-CM | POA: Diagnosis not present

## 2022-12-23 DIAGNOSIS — Z79899 Other long term (current) drug therapy: Secondary | ICD-10-CM | POA: Diagnosis not present

## 2022-12-23 DIAGNOSIS — E538 Deficiency of other specified B group vitamins: Secondary | ICD-10-CM | POA: Diagnosis not present

## 2022-12-23 DIAGNOSIS — K219 Gastro-esophageal reflux disease without esophagitis: Secondary | ICD-10-CM | POA: Diagnosis not present

## 2022-12-23 DIAGNOSIS — R41844 Frontal lobe and executive function deficit: Secondary | ICD-10-CM | POA: Diagnosis not present

## 2022-12-23 DIAGNOSIS — F419 Anxiety disorder, unspecified: Secondary | ICD-10-CM | POA: Diagnosis not present

## 2022-12-23 DIAGNOSIS — E559 Vitamin D deficiency, unspecified: Secondary | ICD-10-CM | POA: Diagnosis not present

## 2022-12-23 DIAGNOSIS — E119 Type 2 diabetes mellitus without complications: Secondary | ICD-10-CM | POA: Diagnosis not present

## 2022-12-23 DIAGNOSIS — M170 Bilateral primary osteoarthritis of knee: Secondary | ICD-10-CM | POA: Diagnosis not present

## 2022-12-23 DIAGNOSIS — S069XAS Unspecified intracranial injury with loss of consciousness status unknown, sequela: Secondary | ICD-10-CM | POA: Diagnosis not present

## 2022-12-23 DIAGNOSIS — Z87891 Personal history of nicotine dependence: Secondary | ICD-10-CM | POA: Diagnosis not present

## 2022-12-23 DIAGNOSIS — Z7982 Long term (current) use of aspirin: Secondary | ICD-10-CM | POA: Diagnosis not present

## 2022-12-23 DIAGNOSIS — Z7984 Long term (current) use of oral hypoglycemic drugs: Secondary | ICD-10-CM | POA: Diagnosis not present

## 2022-12-23 DIAGNOSIS — E7800 Pure hypercholesterolemia, unspecified: Secondary | ICD-10-CM | POA: Diagnosis not present

## 2022-12-23 DIAGNOSIS — Z7951 Long term (current) use of inhaled steroids: Secondary | ICD-10-CM | POA: Diagnosis not present

## 2022-12-25 DIAGNOSIS — F419 Anxiety disorder, unspecified: Secondary | ICD-10-CM | POA: Diagnosis not present

## 2022-12-25 DIAGNOSIS — E559 Vitamin D deficiency, unspecified: Secondary | ICD-10-CM | POA: Diagnosis not present

## 2022-12-25 DIAGNOSIS — Z7984 Long term (current) use of oral hypoglycemic drugs: Secondary | ICD-10-CM | POA: Diagnosis not present

## 2022-12-25 DIAGNOSIS — Z7982 Long term (current) use of aspirin: Secondary | ICD-10-CM | POA: Diagnosis not present

## 2022-12-25 DIAGNOSIS — R41844 Frontal lobe and executive function deficit: Secondary | ICD-10-CM | POA: Diagnosis not present

## 2022-12-25 DIAGNOSIS — S069XAS Unspecified intracranial injury with loss of consciousness status unknown, sequela: Secondary | ICD-10-CM | POA: Diagnosis not present

## 2022-12-25 DIAGNOSIS — E538 Deficiency of other specified B group vitamins: Secondary | ICD-10-CM | POA: Diagnosis not present

## 2022-12-25 DIAGNOSIS — Z79899 Other long term (current) drug therapy: Secondary | ICD-10-CM | POA: Diagnosis not present

## 2022-12-25 DIAGNOSIS — Z87891 Personal history of nicotine dependence: Secondary | ICD-10-CM | POA: Diagnosis not present

## 2022-12-25 DIAGNOSIS — Z9181 History of falling: Secondary | ICD-10-CM | POA: Diagnosis not present

## 2022-12-25 DIAGNOSIS — E7800 Pure hypercholesterolemia, unspecified: Secondary | ICD-10-CM | POA: Diagnosis not present

## 2022-12-25 DIAGNOSIS — Z7951 Long term (current) use of inhaled steroids: Secondary | ICD-10-CM | POA: Diagnosis not present

## 2022-12-25 DIAGNOSIS — K219 Gastro-esophageal reflux disease without esophagitis: Secondary | ICD-10-CM | POA: Diagnosis not present

## 2022-12-25 DIAGNOSIS — E119 Type 2 diabetes mellitus without complications: Secondary | ICD-10-CM | POA: Diagnosis not present

## 2022-12-25 DIAGNOSIS — M170 Bilateral primary osteoarthritis of knee: Secondary | ICD-10-CM | POA: Diagnosis not present

## 2022-12-25 DIAGNOSIS — I10 Essential (primary) hypertension: Secondary | ICD-10-CM | POA: Diagnosis not present

## 2022-12-26 DIAGNOSIS — K080 Exfoliation of teeth due to systemic causes: Secondary | ICD-10-CM | POA: Diagnosis not present

## 2022-12-30 ENCOUNTER — Other Ambulatory Visit: Payer: Self-pay | Admitting: Internal Medicine

## 2022-12-30 DIAGNOSIS — Z79899 Other long term (current) drug therapy: Secondary | ICD-10-CM | POA: Diagnosis not present

## 2022-12-30 DIAGNOSIS — I10 Essential (primary) hypertension: Secondary | ICD-10-CM | POA: Diagnosis not present

## 2022-12-30 DIAGNOSIS — Z87891 Personal history of nicotine dependence: Secondary | ICD-10-CM | POA: Diagnosis not present

## 2022-12-30 DIAGNOSIS — F419 Anxiety disorder, unspecified: Secondary | ICD-10-CM | POA: Diagnosis not present

## 2022-12-30 DIAGNOSIS — M170 Bilateral primary osteoarthritis of knee: Secondary | ICD-10-CM | POA: Diagnosis not present

## 2022-12-30 DIAGNOSIS — Z7982 Long term (current) use of aspirin: Secondary | ICD-10-CM | POA: Diagnosis not present

## 2022-12-30 DIAGNOSIS — Z7984 Long term (current) use of oral hypoglycemic drugs: Secondary | ICD-10-CM | POA: Diagnosis not present

## 2022-12-30 DIAGNOSIS — E1169 Type 2 diabetes mellitus with other specified complication: Secondary | ICD-10-CM

## 2022-12-30 DIAGNOSIS — Z7951 Long term (current) use of inhaled steroids: Secondary | ICD-10-CM | POA: Diagnosis not present

## 2022-12-30 DIAGNOSIS — Z9181 History of falling: Secondary | ICD-10-CM | POA: Diagnosis not present

## 2022-12-30 DIAGNOSIS — E7800 Pure hypercholesterolemia, unspecified: Secondary | ICD-10-CM | POA: Diagnosis not present

## 2022-12-30 DIAGNOSIS — E538 Deficiency of other specified B group vitamins: Secondary | ICD-10-CM | POA: Diagnosis not present

## 2022-12-30 DIAGNOSIS — E559 Vitamin D deficiency, unspecified: Secondary | ICD-10-CM | POA: Diagnosis not present

## 2022-12-30 DIAGNOSIS — R41844 Frontal lobe and executive function deficit: Secondary | ICD-10-CM | POA: Diagnosis not present

## 2022-12-30 DIAGNOSIS — K219 Gastro-esophageal reflux disease without esophagitis: Secondary | ICD-10-CM | POA: Diagnosis not present

## 2022-12-30 DIAGNOSIS — S069XAS Unspecified intracranial injury with loss of consciousness status unknown, sequela: Secondary | ICD-10-CM | POA: Diagnosis not present

## 2022-12-30 DIAGNOSIS — E119 Type 2 diabetes mellitus without complications: Secondary | ICD-10-CM | POA: Diagnosis not present

## 2022-12-30 NOTE — Telephone Encounter (Signed)
Please refill as per office routine med refill policy (all routine meds to be refilled for 3 mo or monthly (per pt preference) up to one year from last visit, then month to month grace period for 3 mo, then further med refills will have to be denied)

## 2022-12-31 ENCOUNTER — Other Ambulatory Visit: Payer: Self-pay

## 2023-01-02 DIAGNOSIS — Z7984 Long term (current) use of oral hypoglycemic drugs: Secondary | ICD-10-CM | POA: Diagnosis not present

## 2023-01-02 DIAGNOSIS — K219 Gastro-esophageal reflux disease without esophagitis: Secondary | ICD-10-CM | POA: Diagnosis not present

## 2023-01-02 DIAGNOSIS — F419 Anxiety disorder, unspecified: Secondary | ICD-10-CM | POA: Diagnosis not present

## 2023-01-02 DIAGNOSIS — Z79899 Other long term (current) drug therapy: Secondary | ICD-10-CM | POA: Diagnosis not present

## 2023-01-02 DIAGNOSIS — I10 Essential (primary) hypertension: Secondary | ICD-10-CM | POA: Diagnosis not present

## 2023-01-02 DIAGNOSIS — Z7982 Long term (current) use of aspirin: Secondary | ICD-10-CM | POA: Diagnosis not present

## 2023-01-02 DIAGNOSIS — Z7951 Long term (current) use of inhaled steroids: Secondary | ICD-10-CM | POA: Diagnosis not present

## 2023-01-02 DIAGNOSIS — R41844 Frontal lobe and executive function deficit: Secondary | ICD-10-CM | POA: Diagnosis not present

## 2023-01-02 DIAGNOSIS — S069XAS Unspecified intracranial injury with loss of consciousness status unknown, sequela: Secondary | ICD-10-CM | POA: Diagnosis not present

## 2023-01-02 DIAGNOSIS — E559 Vitamin D deficiency, unspecified: Secondary | ICD-10-CM | POA: Diagnosis not present

## 2023-01-02 DIAGNOSIS — E538 Deficiency of other specified B group vitamins: Secondary | ICD-10-CM | POA: Diagnosis not present

## 2023-01-02 DIAGNOSIS — Z9181 History of falling: Secondary | ICD-10-CM | POA: Diagnosis not present

## 2023-01-02 DIAGNOSIS — M170 Bilateral primary osteoarthritis of knee: Secondary | ICD-10-CM | POA: Diagnosis not present

## 2023-01-02 DIAGNOSIS — E7800 Pure hypercholesterolemia, unspecified: Secondary | ICD-10-CM | POA: Diagnosis not present

## 2023-01-02 DIAGNOSIS — E119 Type 2 diabetes mellitus without complications: Secondary | ICD-10-CM | POA: Diagnosis not present

## 2023-01-02 DIAGNOSIS — Z87891 Personal history of nicotine dependence: Secondary | ICD-10-CM | POA: Diagnosis not present

## 2023-01-06 ENCOUNTER — Telehealth: Payer: Self-pay | Admitting: Family Medicine

## 2023-01-06 DIAGNOSIS — E7800 Pure hypercholesterolemia, unspecified: Secondary | ICD-10-CM | POA: Diagnosis not present

## 2023-01-06 DIAGNOSIS — I10 Essential (primary) hypertension: Secondary | ICD-10-CM | POA: Diagnosis not present

## 2023-01-06 DIAGNOSIS — Z7984 Long term (current) use of oral hypoglycemic drugs: Secondary | ICD-10-CM | POA: Diagnosis not present

## 2023-01-06 DIAGNOSIS — Z9181 History of falling: Secondary | ICD-10-CM | POA: Diagnosis not present

## 2023-01-06 DIAGNOSIS — K219 Gastro-esophageal reflux disease without esophagitis: Secondary | ICD-10-CM | POA: Diagnosis not present

## 2023-01-06 DIAGNOSIS — S069XAS Unspecified intracranial injury with loss of consciousness status unknown, sequela: Secondary | ICD-10-CM | POA: Diagnosis not present

## 2023-01-06 DIAGNOSIS — E119 Type 2 diabetes mellitus without complications: Secondary | ICD-10-CM | POA: Diagnosis not present

## 2023-01-06 DIAGNOSIS — R41844 Frontal lobe and executive function deficit: Secondary | ICD-10-CM | POA: Diagnosis not present

## 2023-01-06 DIAGNOSIS — M170 Bilateral primary osteoarthritis of knee: Secondary | ICD-10-CM | POA: Diagnosis not present

## 2023-01-06 DIAGNOSIS — Z7982 Long term (current) use of aspirin: Secondary | ICD-10-CM | POA: Diagnosis not present

## 2023-01-06 DIAGNOSIS — Z87891 Personal history of nicotine dependence: Secondary | ICD-10-CM | POA: Diagnosis not present

## 2023-01-06 DIAGNOSIS — E538 Deficiency of other specified B group vitamins: Secondary | ICD-10-CM | POA: Diagnosis not present

## 2023-01-06 DIAGNOSIS — E559 Vitamin D deficiency, unspecified: Secondary | ICD-10-CM | POA: Diagnosis not present

## 2023-01-06 DIAGNOSIS — F419 Anxiety disorder, unspecified: Secondary | ICD-10-CM | POA: Diagnosis not present

## 2023-01-06 DIAGNOSIS — Z7951 Long term (current) use of inhaled steroids: Secondary | ICD-10-CM | POA: Diagnosis not present

## 2023-01-06 DIAGNOSIS — Z79899 Other long term (current) drug therapy: Secondary | ICD-10-CM | POA: Diagnosis not present

## 2023-01-06 NOTE — Telephone Encounter (Signed)
Alexandra Wolfe called stating that when they were out to see her this morning she reported to them that she fell over the weekend a couple times. She mentioned that she did hit her head lightly but did not see any bruising or injury.  Just FYI.

## 2023-01-08 DIAGNOSIS — E538 Deficiency of other specified B group vitamins: Secondary | ICD-10-CM | POA: Diagnosis not present

## 2023-01-08 DIAGNOSIS — Z87891 Personal history of nicotine dependence: Secondary | ICD-10-CM | POA: Diagnosis not present

## 2023-01-08 DIAGNOSIS — E7800 Pure hypercholesterolemia, unspecified: Secondary | ICD-10-CM | POA: Diagnosis not present

## 2023-01-08 DIAGNOSIS — Z9181 History of falling: Secondary | ICD-10-CM | POA: Diagnosis not present

## 2023-01-08 DIAGNOSIS — Z7984 Long term (current) use of oral hypoglycemic drugs: Secondary | ICD-10-CM | POA: Diagnosis not present

## 2023-01-08 DIAGNOSIS — K219 Gastro-esophageal reflux disease without esophagitis: Secondary | ICD-10-CM | POA: Diagnosis not present

## 2023-01-08 DIAGNOSIS — Z79899 Other long term (current) drug therapy: Secondary | ICD-10-CM | POA: Diagnosis not present

## 2023-01-08 DIAGNOSIS — S069XAS Unspecified intracranial injury with loss of consciousness status unknown, sequela: Secondary | ICD-10-CM | POA: Diagnosis not present

## 2023-01-08 DIAGNOSIS — Z7982 Long term (current) use of aspirin: Secondary | ICD-10-CM | POA: Diagnosis not present

## 2023-01-08 DIAGNOSIS — E119 Type 2 diabetes mellitus without complications: Secondary | ICD-10-CM | POA: Diagnosis not present

## 2023-01-08 DIAGNOSIS — Z7951 Long term (current) use of inhaled steroids: Secondary | ICD-10-CM | POA: Diagnosis not present

## 2023-01-08 DIAGNOSIS — F419 Anxiety disorder, unspecified: Secondary | ICD-10-CM | POA: Diagnosis not present

## 2023-01-08 DIAGNOSIS — M170 Bilateral primary osteoarthritis of knee: Secondary | ICD-10-CM | POA: Diagnosis not present

## 2023-01-08 DIAGNOSIS — I10 Essential (primary) hypertension: Secondary | ICD-10-CM | POA: Diagnosis not present

## 2023-01-08 DIAGNOSIS — R41844 Frontal lobe and executive function deficit: Secondary | ICD-10-CM | POA: Diagnosis not present

## 2023-01-08 DIAGNOSIS — E559 Vitamin D deficiency, unspecified: Secondary | ICD-10-CM | POA: Diagnosis not present

## 2023-01-09 DIAGNOSIS — Z7984 Long term (current) use of oral hypoglycemic drugs: Secondary | ICD-10-CM | POA: Diagnosis not present

## 2023-01-09 DIAGNOSIS — Z79899 Other long term (current) drug therapy: Secondary | ICD-10-CM | POA: Diagnosis not present

## 2023-01-09 DIAGNOSIS — E119 Type 2 diabetes mellitus without complications: Secondary | ICD-10-CM | POA: Diagnosis not present

## 2023-01-09 DIAGNOSIS — E538 Deficiency of other specified B group vitamins: Secondary | ICD-10-CM | POA: Diagnosis not present

## 2023-01-09 DIAGNOSIS — R41844 Frontal lobe and executive function deficit: Secondary | ICD-10-CM | POA: Diagnosis not present

## 2023-01-09 DIAGNOSIS — S069XAS Unspecified intracranial injury with loss of consciousness status unknown, sequela: Secondary | ICD-10-CM | POA: Diagnosis not present

## 2023-01-09 DIAGNOSIS — Z7982 Long term (current) use of aspirin: Secondary | ICD-10-CM | POA: Diagnosis not present

## 2023-01-09 DIAGNOSIS — E7800 Pure hypercholesterolemia, unspecified: Secondary | ICD-10-CM | POA: Diagnosis not present

## 2023-01-09 DIAGNOSIS — F419 Anxiety disorder, unspecified: Secondary | ICD-10-CM | POA: Diagnosis not present

## 2023-01-09 DIAGNOSIS — Z9181 History of falling: Secondary | ICD-10-CM | POA: Diagnosis not present

## 2023-01-09 DIAGNOSIS — Z87891 Personal history of nicotine dependence: Secondary | ICD-10-CM | POA: Diagnosis not present

## 2023-01-09 DIAGNOSIS — K219 Gastro-esophageal reflux disease without esophagitis: Secondary | ICD-10-CM | POA: Diagnosis not present

## 2023-01-09 DIAGNOSIS — Z7951 Long term (current) use of inhaled steroids: Secondary | ICD-10-CM | POA: Diagnosis not present

## 2023-01-09 DIAGNOSIS — I10 Essential (primary) hypertension: Secondary | ICD-10-CM | POA: Diagnosis not present

## 2023-01-09 DIAGNOSIS — M170 Bilateral primary osteoarthritis of knee: Secondary | ICD-10-CM | POA: Diagnosis not present

## 2023-01-09 DIAGNOSIS — E559 Vitamin D deficiency, unspecified: Secondary | ICD-10-CM | POA: Diagnosis not present

## 2023-01-09 NOTE — Telephone Encounter (Signed)
If worsening return to clinic or go to the ER.

## 2023-01-13 DIAGNOSIS — S069XAS Unspecified intracranial injury with loss of consciousness status unknown, sequela: Secondary | ICD-10-CM | POA: Diagnosis not present

## 2023-01-13 DIAGNOSIS — E559 Vitamin D deficiency, unspecified: Secondary | ICD-10-CM | POA: Diagnosis not present

## 2023-01-13 DIAGNOSIS — Z7984 Long term (current) use of oral hypoglycemic drugs: Secondary | ICD-10-CM | POA: Diagnosis not present

## 2023-01-13 DIAGNOSIS — E7800 Pure hypercholesterolemia, unspecified: Secondary | ICD-10-CM | POA: Diagnosis not present

## 2023-01-13 DIAGNOSIS — I10 Essential (primary) hypertension: Secondary | ICD-10-CM | POA: Diagnosis not present

## 2023-01-13 DIAGNOSIS — Z87891 Personal history of nicotine dependence: Secondary | ICD-10-CM | POA: Diagnosis not present

## 2023-01-13 DIAGNOSIS — M170 Bilateral primary osteoarthritis of knee: Secondary | ICD-10-CM | POA: Diagnosis not present

## 2023-01-13 DIAGNOSIS — Z7982 Long term (current) use of aspirin: Secondary | ICD-10-CM | POA: Diagnosis not present

## 2023-01-13 DIAGNOSIS — F419 Anxiety disorder, unspecified: Secondary | ICD-10-CM | POA: Diagnosis not present

## 2023-01-13 DIAGNOSIS — R41844 Frontal lobe and executive function deficit: Secondary | ICD-10-CM | POA: Diagnosis not present

## 2023-01-13 DIAGNOSIS — K219 Gastro-esophageal reflux disease without esophagitis: Secondary | ICD-10-CM | POA: Diagnosis not present

## 2023-01-13 DIAGNOSIS — Z7951 Long term (current) use of inhaled steroids: Secondary | ICD-10-CM | POA: Diagnosis not present

## 2023-01-13 DIAGNOSIS — E119 Type 2 diabetes mellitus without complications: Secondary | ICD-10-CM | POA: Diagnosis not present

## 2023-01-13 DIAGNOSIS — E538 Deficiency of other specified B group vitamins: Secondary | ICD-10-CM | POA: Diagnosis not present

## 2023-01-13 DIAGNOSIS — Z9181 History of falling: Secondary | ICD-10-CM | POA: Diagnosis not present

## 2023-01-13 DIAGNOSIS — Z79899 Other long term (current) drug therapy: Secondary | ICD-10-CM | POA: Diagnosis not present

## 2023-01-15 ENCOUNTER — Other Ambulatory Visit: Payer: Self-pay | Admitting: Internal Medicine

## 2023-01-15 NOTE — Telephone Encounter (Signed)
Please refill as per office routine med refill policy (all routine meds to be refilled for 3 mo or monthly (per pt preference) up to one year from last visit, then month to month grace period for 3 mo, then further med refills will have to be denied) ? ?

## 2023-01-16 DIAGNOSIS — E559 Vitamin D deficiency, unspecified: Secondary | ICD-10-CM | POA: Diagnosis not present

## 2023-01-16 DIAGNOSIS — Z7982 Long term (current) use of aspirin: Secondary | ICD-10-CM | POA: Diagnosis not present

## 2023-01-16 DIAGNOSIS — S069XAS Unspecified intracranial injury with loss of consciousness status unknown, sequela: Secondary | ICD-10-CM | POA: Diagnosis not present

## 2023-01-16 DIAGNOSIS — Z79899 Other long term (current) drug therapy: Secondary | ICD-10-CM | POA: Diagnosis not present

## 2023-01-16 DIAGNOSIS — I10 Essential (primary) hypertension: Secondary | ICD-10-CM | POA: Diagnosis not present

## 2023-01-16 DIAGNOSIS — Z7984 Long term (current) use of oral hypoglycemic drugs: Secondary | ICD-10-CM | POA: Diagnosis not present

## 2023-01-16 DIAGNOSIS — E7800 Pure hypercholesterolemia, unspecified: Secondary | ICD-10-CM | POA: Diagnosis not present

## 2023-01-16 DIAGNOSIS — E538 Deficiency of other specified B group vitamins: Secondary | ICD-10-CM | POA: Diagnosis not present

## 2023-01-16 DIAGNOSIS — Z87891 Personal history of nicotine dependence: Secondary | ICD-10-CM | POA: Diagnosis not present

## 2023-01-16 DIAGNOSIS — R41844 Frontal lobe and executive function deficit: Secondary | ICD-10-CM | POA: Diagnosis not present

## 2023-01-16 DIAGNOSIS — M170 Bilateral primary osteoarthritis of knee: Secondary | ICD-10-CM | POA: Diagnosis not present

## 2023-01-16 DIAGNOSIS — F419 Anxiety disorder, unspecified: Secondary | ICD-10-CM | POA: Diagnosis not present

## 2023-01-16 DIAGNOSIS — E119 Type 2 diabetes mellitus without complications: Secondary | ICD-10-CM | POA: Diagnosis not present

## 2023-01-16 DIAGNOSIS — K219 Gastro-esophageal reflux disease without esophagitis: Secondary | ICD-10-CM | POA: Diagnosis not present

## 2023-01-16 DIAGNOSIS — Z9181 History of falling: Secondary | ICD-10-CM | POA: Diagnosis not present

## 2023-01-16 DIAGNOSIS — Z7951 Long term (current) use of inhaled steroids: Secondary | ICD-10-CM | POA: Diagnosis not present

## 2023-01-20 DIAGNOSIS — F419 Anxiety disorder, unspecified: Secondary | ICD-10-CM | POA: Diagnosis not present

## 2023-01-20 DIAGNOSIS — E119 Type 2 diabetes mellitus without complications: Secondary | ICD-10-CM | POA: Diagnosis not present

## 2023-01-20 DIAGNOSIS — R41844 Frontal lobe and executive function deficit: Secondary | ICD-10-CM | POA: Diagnosis not present

## 2023-01-20 DIAGNOSIS — Z87891 Personal history of nicotine dependence: Secondary | ICD-10-CM | POA: Diagnosis not present

## 2023-01-20 DIAGNOSIS — M170 Bilateral primary osteoarthritis of knee: Secondary | ICD-10-CM | POA: Diagnosis not present

## 2023-01-20 DIAGNOSIS — Z7982 Long term (current) use of aspirin: Secondary | ICD-10-CM | POA: Diagnosis not present

## 2023-01-20 DIAGNOSIS — E559 Vitamin D deficiency, unspecified: Secondary | ICD-10-CM | POA: Diagnosis not present

## 2023-01-20 DIAGNOSIS — I10 Essential (primary) hypertension: Secondary | ICD-10-CM | POA: Diagnosis not present

## 2023-01-20 DIAGNOSIS — E7800 Pure hypercholesterolemia, unspecified: Secondary | ICD-10-CM | POA: Diagnosis not present

## 2023-01-20 DIAGNOSIS — S069XAS Unspecified intracranial injury with loss of consciousness status unknown, sequela: Secondary | ICD-10-CM | POA: Diagnosis not present

## 2023-01-20 DIAGNOSIS — Z7984 Long term (current) use of oral hypoglycemic drugs: Secondary | ICD-10-CM | POA: Diagnosis not present

## 2023-01-20 DIAGNOSIS — Z9181 History of falling: Secondary | ICD-10-CM | POA: Diagnosis not present

## 2023-01-20 DIAGNOSIS — E538 Deficiency of other specified B group vitamins: Secondary | ICD-10-CM | POA: Diagnosis not present

## 2023-01-20 DIAGNOSIS — K219 Gastro-esophageal reflux disease without esophagitis: Secondary | ICD-10-CM | POA: Diagnosis not present

## 2023-01-20 DIAGNOSIS — Z79899 Other long term (current) drug therapy: Secondary | ICD-10-CM | POA: Diagnosis not present

## 2023-01-20 DIAGNOSIS — Z7951 Long term (current) use of inhaled steroids: Secondary | ICD-10-CM | POA: Diagnosis not present

## 2023-01-24 ENCOUNTER — Other Ambulatory Visit: Payer: Self-pay

## 2023-01-24 DIAGNOSIS — I10 Essential (primary) hypertension: Secondary | ICD-10-CM

## 2023-01-24 DIAGNOSIS — E034 Atrophy of thyroid (acquired): Secondary | ICD-10-CM

## 2023-01-24 MED ORDER — METOPROLOL SUCCINATE ER 25 MG PO TB24: ORAL_TABLET | ORAL | 3 refills | Status: DC

## 2023-01-24 MED ORDER — LEVOTHYROXINE SODIUM 50 MCG PO TABS: ORAL_TABLET | ORAL | 3 refills | Status: DC

## 2023-01-27 DIAGNOSIS — Z9181 History of falling: Secondary | ICD-10-CM | POA: Diagnosis not present

## 2023-01-27 DIAGNOSIS — Z87891 Personal history of nicotine dependence: Secondary | ICD-10-CM | POA: Diagnosis not present

## 2023-01-27 DIAGNOSIS — E119 Type 2 diabetes mellitus without complications: Secondary | ICD-10-CM | POA: Diagnosis not present

## 2023-01-27 DIAGNOSIS — Z7951 Long term (current) use of inhaled steroids: Secondary | ICD-10-CM | POA: Diagnosis not present

## 2023-01-27 DIAGNOSIS — K219 Gastro-esophageal reflux disease without esophagitis: Secondary | ICD-10-CM | POA: Diagnosis not present

## 2023-01-27 DIAGNOSIS — Z7982 Long term (current) use of aspirin: Secondary | ICD-10-CM | POA: Diagnosis not present

## 2023-01-27 DIAGNOSIS — F419 Anxiety disorder, unspecified: Secondary | ICD-10-CM | POA: Diagnosis not present

## 2023-01-27 DIAGNOSIS — Z79899 Other long term (current) drug therapy: Secondary | ICD-10-CM | POA: Diagnosis not present

## 2023-01-27 DIAGNOSIS — E7800 Pure hypercholesterolemia, unspecified: Secondary | ICD-10-CM | POA: Diagnosis not present

## 2023-01-27 DIAGNOSIS — E559 Vitamin D deficiency, unspecified: Secondary | ICD-10-CM | POA: Diagnosis not present

## 2023-01-27 DIAGNOSIS — I10 Essential (primary) hypertension: Secondary | ICD-10-CM | POA: Diagnosis not present

## 2023-01-27 DIAGNOSIS — R41844 Frontal lobe and executive function deficit: Secondary | ICD-10-CM | POA: Diagnosis not present

## 2023-01-27 DIAGNOSIS — S069XAS Unspecified intracranial injury with loss of consciousness status unknown, sequela: Secondary | ICD-10-CM | POA: Diagnosis not present

## 2023-01-27 DIAGNOSIS — Z7984 Long term (current) use of oral hypoglycemic drugs: Secondary | ICD-10-CM | POA: Diagnosis not present

## 2023-01-27 DIAGNOSIS — M170 Bilateral primary osteoarthritis of knee: Secondary | ICD-10-CM | POA: Diagnosis not present

## 2023-01-27 DIAGNOSIS — E538 Deficiency of other specified B group vitamins: Secondary | ICD-10-CM | POA: Diagnosis not present

## 2023-02-03 DIAGNOSIS — Z7951 Long term (current) use of inhaled steroids: Secondary | ICD-10-CM | POA: Diagnosis not present

## 2023-02-03 DIAGNOSIS — E7800 Pure hypercholesterolemia, unspecified: Secondary | ICD-10-CM | POA: Diagnosis not present

## 2023-02-03 DIAGNOSIS — R41844 Frontal lobe and executive function deficit: Secondary | ICD-10-CM | POA: Diagnosis not present

## 2023-02-03 DIAGNOSIS — F419 Anxiety disorder, unspecified: Secondary | ICD-10-CM | POA: Diagnosis not present

## 2023-02-03 DIAGNOSIS — E559 Vitamin D deficiency, unspecified: Secondary | ICD-10-CM | POA: Diagnosis not present

## 2023-02-03 DIAGNOSIS — Z9181 History of falling: Secondary | ICD-10-CM | POA: Diagnosis not present

## 2023-02-03 DIAGNOSIS — E538 Deficiency of other specified B group vitamins: Secondary | ICD-10-CM | POA: Diagnosis not present

## 2023-02-03 DIAGNOSIS — K219 Gastro-esophageal reflux disease without esophagitis: Secondary | ICD-10-CM | POA: Diagnosis not present

## 2023-02-03 DIAGNOSIS — E119 Type 2 diabetes mellitus without complications: Secondary | ICD-10-CM | POA: Diagnosis not present

## 2023-02-03 DIAGNOSIS — M170 Bilateral primary osteoarthritis of knee: Secondary | ICD-10-CM | POA: Diagnosis not present

## 2023-02-03 DIAGNOSIS — Z87891 Personal history of nicotine dependence: Secondary | ICD-10-CM | POA: Diagnosis not present

## 2023-02-03 DIAGNOSIS — S069XAS Unspecified intracranial injury with loss of consciousness status unknown, sequela: Secondary | ICD-10-CM | POA: Diagnosis not present

## 2023-02-03 DIAGNOSIS — I10 Essential (primary) hypertension: Secondary | ICD-10-CM | POA: Diagnosis not present

## 2023-02-03 DIAGNOSIS — Z7984 Long term (current) use of oral hypoglycemic drugs: Secondary | ICD-10-CM | POA: Diagnosis not present

## 2023-02-03 DIAGNOSIS — Z79899 Other long term (current) drug therapy: Secondary | ICD-10-CM | POA: Diagnosis not present

## 2023-02-03 DIAGNOSIS — Z7982 Long term (current) use of aspirin: Secondary | ICD-10-CM | POA: Diagnosis not present

## 2023-02-04 ENCOUNTER — Telehealth: Payer: Self-pay | Admitting: Internal Medicine

## 2023-02-04 DIAGNOSIS — K080 Exfoliation of teeth due to systemic causes: Secondary | ICD-10-CM | POA: Diagnosis not present

## 2023-02-04 NOTE — Telephone Encounter (Signed)
Alexandra Wolfe calls today with [facility]. Nira Conn is a physical therapist with [facility] and she was wanting to extend PT's original physical therapy orders. The extension of the frequency being  Once a week for eight weeks to help with fall prevention.  These orders can be left verbally at   [facility]: [phone]

## 2023-02-05 NOTE — Telephone Encounter (Signed)
Ok for verbals 

## 2023-02-05 NOTE — Telephone Encounter (Signed)
Heather at [employer] given ok for verbal orders

## 2023-02-05 NOTE — Telephone Encounter (Signed)
Please advise for verbal orders 

## 2023-02-08 DIAGNOSIS — K219 Gastro-esophageal reflux disease without esophagitis: Secondary | ICD-10-CM | POA: Diagnosis not present

## 2023-02-08 DIAGNOSIS — Z7982 Long term (current) use of aspirin: Secondary | ICD-10-CM | POA: Diagnosis not present

## 2023-02-08 DIAGNOSIS — M170 Bilateral primary osteoarthritis of knee: Secondary | ICD-10-CM | POA: Diagnosis not present

## 2023-02-08 DIAGNOSIS — F419 Anxiety disorder, unspecified: Secondary | ICD-10-CM | POA: Diagnosis not present

## 2023-02-08 DIAGNOSIS — E538 Deficiency of other specified B group vitamins: Secondary | ICD-10-CM | POA: Diagnosis not present

## 2023-02-08 DIAGNOSIS — R41844 Frontal lobe and executive function deficit: Secondary | ICD-10-CM | POA: Diagnosis not present

## 2023-02-08 DIAGNOSIS — Z87891 Personal history of nicotine dependence: Secondary | ICD-10-CM | POA: Diagnosis not present

## 2023-02-08 DIAGNOSIS — E7800 Pure hypercholesterolemia, unspecified: Secondary | ICD-10-CM | POA: Diagnosis not present

## 2023-02-08 DIAGNOSIS — Z7984 Long term (current) use of oral hypoglycemic drugs: Secondary | ICD-10-CM | POA: Diagnosis not present

## 2023-02-08 DIAGNOSIS — Z79899 Other long term (current) drug therapy: Secondary | ICD-10-CM | POA: Diagnosis not present

## 2023-02-08 DIAGNOSIS — Z87820 Personal history of traumatic brain injury: Secondary | ICD-10-CM | POA: Diagnosis not present

## 2023-02-08 DIAGNOSIS — E119 Type 2 diabetes mellitus without complications: Secondary | ICD-10-CM | POA: Diagnosis not present

## 2023-02-08 DIAGNOSIS — Z9181 History of falling: Secondary | ICD-10-CM | POA: Diagnosis not present

## 2023-02-08 DIAGNOSIS — I10 Essential (primary) hypertension: Secondary | ICD-10-CM | POA: Diagnosis not present

## 2023-02-08 DIAGNOSIS — Z7951 Long term (current) use of inhaled steroids: Secondary | ICD-10-CM | POA: Diagnosis not present

## 2023-02-08 DIAGNOSIS — E559 Vitamin D deficiency, unspecified: Secondary | ICD-10-CM | POA: Diagnosis not present

## 2023-02-12 DIAGNOSIS — F419 Anxiety disorder, unspecified: Secondary | ICD-10-CM | POA: Diagnosis not present

## 2023-02-12 DIAGNOSIS — I10 Essential (primary) hypertension: Secondary | ICD-10-CM | POA: Diagnosis not present

## 2023-02-12 DIAGNOSIS — Z87820 Personal history of traumatic brain injury: Secondary | ICD-10-CM | POA: Diagnosis not present

## 2023-02-12 DIAGNOSIS — Z7951 Long term (current) use of inhaled steroids: Secondary | ICD-10-CM | POA: Diagnosis not present

## 2023-02-12 DIAGNOSIS — E7800 Pure hypercholesterolemia, unspecified: Secondary | ICD-10-CM | POA: Diagnosis not present

## 2023-02-12 DIAGNOSIS — R41844 Frontal lobe and executive function deficit: Secondary | ICD-10-CM | POA: Diagnosis not present

## 2023-02-12 DIAGNOSIS — E119 Type 2 diabetes mellitus without complications: Secondary | ICD-10-CM | POA: Diagnosis not present

## 2023-02-12 DIAGNOSIS — Z79899 Other long term (current) drug therapy: Secondary | ICD-10-CM | POA: Diagnosis not present

## 2023-02-12 DIAGNOSIS — K219 Gastro-esophageal reflux disease without esophagitis: Secondary | ICD-10-CM | POA: Diagnosis not present

## 2023-02-12 DIAGNOSIS — M170 Bilateral primary osteoarthritis of knee: Secondary | ICD-10-CM | POA: Diagnosis not present

## 2023-02-12 DIAGNOSIS — Z87891 Personal history of nicotine dependence: Secondary | ICD-10-CM | POA: Diagnosis not present

## 2023-02-12 DIAGNOSIS — Z9181 History of falling: Secondary | ICD-10-CM | POA: Diagnosis not present

## 2023-02-12 DIAGNOSIS — E559 Vitamin D deficiency, unspecified: Secondary | ICD-10-CM | POA: Diagnosis not present

## 2023-02-12 DIAGNOSIS — Z7984 Long term (current) use of oral hypoglycemic drugs: Secondary | ICD-10-CM | POA: Diagnosis not present

## 2023-02-12 DIAGNOSIS — Z7982 Long term (current) use of aspirin: Secondary | ICD-10-CM | POA: Diagnosis not present

## 2023-02-12 DIAGNOSIS — E538 Deficiency of other specified B group vitamins: Secondary | ICD-10-CM | POA: Diagnosis not present

## 2023-02-19 DIAGNOSIS — Z7982 Long term (current) use of aspirin: Secondary | ICD-10-CM | POA: Diagnosis not present

## 2023-02-19 DIAGNOSIS — Z87820 Personal history of traumatic brain injury: Secondary | ICD-10-CM | POA: Diagnosis not present

## 2023-02-19 DIAGNOSIS — Z7951 Long term (current) use of inhaled steroids: Secondary | ICD-10-CM | POA: Diagnosis not present

## 2023-02-19 DIAGNOSIS — E559 Vitamin D deficiency, unspecified: Secondary | ICD-10-CM | POA: Diagnosis not present

## 2023-02-19 DIAGNOSIS — E538 Deficiency of other specified B group vitamins: Secondary | ICD-10-CM | POA: Diagnosis not present

## 2023-02-19 DIAGNOSIS — E7800 Pure hypercholesterolemia, unspecified: Secondary | ICD-10-CM | POA: Diagnosis not present

## 2023-02-19 DIAGNOSIS — Z87891 Personal history of nicotine dependence: Secondary | ICD-10-CM | POA: Diagnosis not present

## 2023-02-19 DIAGNOSIS — R41844 Frontal lobe and executive function deficit: Secondary | ICD-10-CM | POA: Diagnosis not present

## 2023-02-19 DIAGNOSIS — I10 Essential (primary) hypertension: Secondary | ICD-10-CM | POA: Diagnosis not present

## 2023-02-19 DIAGNOSIS — K219 Gastro-esophageal reflux disease without esophagitis: Secondary | ICD-10-CM | POA: Diagnosis not present

## 2023-02-19 DIAGNOSIS — M170 Bilateral primary osteoarthritis of knee: Secondary | ICD-10-CM | POA: Diagnosis not present

## 2023-02-19 DIAGNOSIS — F419 Anxiety disorder, unspecified: Secondary | ICD-10-CM | POA: Diagnosis not present

## 2023-02-19 DIAGNOSIS — E119 Type 2 diabetes mellitus without complications: Secondary | ICD-10-CM | POA: Diagnosis not present

## 2023-02-19 DIAGNOSIS — Z9181 History of falling: Secondary | ICD-10-CM | POA: Diagnosis not present

## 2023-02-19 DIAGNOSIS — Z7984 Long term (current) use of oral hypoglycemic drugs: Secondary | ICD-10-CM | POA: Diagnosis not present

## 2023-02-19 DIAGNOSIS — Z79899 Other long term (current) drug therapy: Secondary | ICD-10-CM | POA: Diagnosis not present

## 2023-02-21 ENCOUNTER — Other Ambulatory Visit: Payer: Self-pay | Admitting: Internal Medicine

## 2023-02-21 DIAGNOSIS — F5101 Primary insomnia: Secondary | ICD-10-CM

## 2023-02-25 DIAGNOSIS — R350 Frequency of micturition: Secondary | ICD-10-CM | POA: Diagnosis not present

## 2023-02-25 DIAGNOSIS — N3946 Mixed incontinence: Secondary | ICD-10-CM | POA: Diagnosis not present

## 2023-02-25 DIAGNOSIS — N3000 Acute cystitis without hematuria: Secondary | ICD-10-CM | POA: Diagnosis not present

## 2023-02-25 DIAGNOSIS — R8271 Bacteriuria: Secondary | ICD-10-CM | POA: Diagnosis not present

## 2023-02-26 DIAGNOSIS — E559 Vitamin D deficiency, unspecified: Secondary | ICD-10-CM | POA: Diagnosis not present

## 2023-02-26 DIAGNOSIS — Z7982 Long term (current) use of aspirin: Secondary | ICD-10-CM | POA: Diagnosis not present

## 2023-02-26 DIAGNOSIS — Z87820 Personal history of traumatic brain injury: Secondary | ICD-10-CM | POA: Diagnosis not present

## 2023-02-26 DIAGNOSIS — K219 Gastro-esophageal reflux disease without esophagitis: Secondary | ICD-10-CM | POA: Diagnosis not present

## 2023-02-26 DIAGNOSIS — Z9181 History of falling: Secondary | ICD-10-CM | POA: Diagnosis not present

## 2023-02-26 DIAGNOSIS — M170 Bilateral primary osteoarthritis of knee: Secondary | ICD-10-CM | POA: Diagnosis not present

## 2023-02-26 DIAGNOSIS — E7800 Pure hypercholesterolemia, unspecified: Secondary | ICD-10-CM | POA: Diagnosis not present

## 2023-02-26 DIAGNOSIS — Z7951 Long term (current) use of inhaled steroids: Secondary | ICD-10-CM | POA: Diagnosis not present

## 2023-02-26 DIAGNOSIS — Z79899 Other long term (current) drug therapy: Secondary | ICD-10-CM | POA: Diagnosis not present

## 2023-02-26 DIAGNOSIS — E119 Type 2 diabetes mellitus without complications: Secondary | ICD-10-CM | POA: Diagnosis not present

## 2023-02-26 DIAGNOSIS — Z87891 Personal history of nicotine dependence: Secondary | ICD-10-CM | POA: Diagnosis not present

## 2023-02-26 DIAGNOSIS — F419 Anxiety disorder, unspecified: Secondary | ICD-10-CM | POA: Diagnosis not present

## 2023-02-26 DIAGNOSIS — R41844 Frontal lobe and executive function deficit: Secondary | ICD-10-CM | POA: Diagnosis not present

## 2023-02-26 DIAGNOSIS — Z7984 Long term (current) use of oral hypoglycemic drugs: Secondary | ICD-10-CM | POA: Diagnosis not present

## 2023-02-26 DIAGNOSIS — E538 Deficiency of other specified B group vitamins: Secondary | ICD-10-CM | POA: Diagnosis not present

## 2023-02-26 DIAGNOSIS — I10 Essential (primary) hypertension: Secondary | ICD-10-CM | POA: Diagnosis not present

## 2023-03-05 ENCOUNTER — Ambulatory Visit: Payer: BLUE CROSS/BLUE SHIELD | Admitting: Family Medicine

## 2023-03-11 ENCOUNTER — Ambulatory Visit: Payer: Medicare Other | Admitting: Family Medicine

## 2023-03-12 ENCOUNTER — Other Ambulatory Visit: Payer: Self-pay

## 2023-03-12 ENCOUNTER — Ambulatory Visit (INDEPENDENT_AMBULATORY_CARE_PROVIDER_SITE_OTHER): Payer: Medicare Other | Admitting: Family Medicine

## 2023-03-12 VITALS — BP 138/86 | HR 85 | Ht 66.0 in

## 2023-03-12 DIAGNOSIS — M25562 Pain in left knee: Secondary | ICD-10-CM

## 2023-03-12 DIAGNOSIS — M25561 Pain in right knee: Secondary | ICD-10-CM

## 2023-03-12 DIAGNOSIS — G8929 Other chronic pain: Secondary | ICD-10-CM

## 2023-03-12 DIAGNOSIS — M170 Bilateral primary osteoarthritis of knee: Secondary | ICD-10-CM | POA: Diagnosis not present

## 2023-03-12 NOTE — Patient Instructions (Signed)
Thank you for coming in today.   You received an injection today. Seek immediate medical attention if the joint becomes red, extremely painful, or is oozing fluid.   We will work to authorize gel and [employer] and let Lonn Georgia or Theadora Rama know when your knee pain returns

## 2023-03-12 NOTE — Progress Notes (Unsigned)
   Shirlyn Goltz, PhD, LAT, ATC acting as a scribe for Alexandra Leader, MD.  Alexandra Wolfe is a 77 y.o. female who presents to [place] [facility] today for f/u bilat shoulder and bilateral knee pain. Pt was last seen by Dr. Georgina Snell on 12/18/22 and was given bilateral Padroni steroid injections. Pt had bilat knee steroid injections on 12/05/23.  Today, pt reports both of her shoulders are really hurting, but she is not wanting any injections due to pain. Pt also has pain in both of her knees that varies from day to day.   Dx imaging: 12/04/22 R & L knee and R & L shoulder XR  12/23/21 R & L knee XR             03/02/20 R & L knee XR  Pertinent review of systems: ***  Relevant historical information: ***   Exam:  There were no vitals taken for this visit. General: Well Developed, well nourished, and in no acute distress.   MSK: ***    Lab and Radiology Results No results found for this or any previous visit (from the past 72 hour(s)). No results found.     Assessment and Plan: 77 y.o. female with ***   PDMP not reviewed this encounter. No orders of the defined types were placed in this encounter.  No orders of the defined types were placed in this encounter.    Discussed warning signs or symptoms. Please see discharge instructions. Patient expresses understanding.   ***

## 2023-03-13 ENCOUNTER — Telehealth: Payer: Self-pay

## 2023-03-13 NOTE — Telephone Encounter (Signed)
Collins, Kayla [address], [place] [address], [place] Please auth gel and Zilretta bilat knees

## 2023-03-13 NOTE — Telephone Encounter (Signed)
Collins, Kayla [address], [place] [address], [place] Please auth gel and Zilretta bilat knees

## 2023-03-17 NOTE — Telephone Encounter (Signed)
VOB initiated for Zilretta for BILAT knee OA 

## 2023-03-18 NOTE — Telephone Encounter (Signed)
Zilretta for BILAT knee OA - buy and bill  Primary Insurance: BCBS Medicare Co-Pay : $15 Co-Insurance: 20% Deductible: does not apply  Prior Auth: NOT required

## 2023-03-18 NOTE — Telephone Encounter (Signed)
VOB initiated for orthovisc, [place], and [employer]

## 2023-03-18 NOTE — Telephone Encounter (Addendum)
Orthovisc for BILAT knee OA - buy and bill (Also checked Zilretta)  Primary Insurance: BCBS Medicare Co-Pay: $15 Co-Insurance: 20% Deductible: does not apply  Prior Auth: NOT required

## 2023-03-19 NOTE — Telephone Encounter (Signed)
Spoke with pt, aware of auth, nothing needed at this time. Will call back to schedule. 

## 2023-03-19 NOTE — Telephone Encounter (Signed)
Spoke with pt, aware of auth, nothing needed at this time. Will call back to schedule.

## 2023-03-20 ENCOUNTER — Other Ambulatory Visit: Payer: Self-pay | Admitting: Internal Medicine

## 2023-03-20 DIAGNOSIS — F5101 Primary insomnia: Secondary | ICD-10-CM

## 2023-03-26 ENCOUNTER — Telehealth: Payer: Self-pay

## 2023-03-26 NOTE — Telephone Encounter (Signed)
Contacted Alexandra Wolfe to schedule their annual wellness visit. Appointment made for 04/01/23.  [name], CMA (AAMA)  [facility] [phone]

## 2023-03-28 ENCOUNTER — Ambulatory Visit: Payer: Medicare Other | Admitting: Internal Medicine

## 2023-04-01 ENCOUNTER — Ambulatory Visit (INDEPENDENT_AMBULATORY_CARE_PROVIDER_SITE_OTHER): Payer: Medicare Other | Admitting: Internal Medicine

## 2023-04-01 ENCOUNTER — Encounter: Payer: Self-pay | Admitting: Internal Medicine

## 2023-04-01 VITALS — BP 128/78 | HR 75 | Temp 98.1°F | Ht 66.0 in

## 2023-04-01 DIAGNOSIS — I10 Essential (primary) hypertension: Secondary | ICD-10-CM | POA: Diagnosis not present

## 2023-04-01 DIAGNOSIS — Z0001 Encounter for general adult medical examination with abnormal findings: Secondary | ICD-10-CM | POA: Diagnosis not present

## 2023-04-01 DIAGNOSIS — E119 Type 2 diabetes mellitus without complications: Secondary | ICD-10-CM | POA: Diagnosis not present

## 2023-04-01 DIAGNOSIS — E034 Atrophy of thyroid (acquired): Secondary | ICD-10-CM

## 2023-04-01 DIAGNOSIS — E7800 Pure hypercholesterolemia, unspecified: Secondary | ICD-10-CM

## 2023-04-01 DIAGNOSIS — Z7984 Long term (current) use of oral hypoglycemic drugs: Secondary | ICD-10-CM

## 2023-04-01 MED ORDER — OZEMPIC (0.25 OR 0.5 MG/DOSE) 2 MG/3ML ~~LOC~~ SOPN
PEN_INJECTOR | SUBCUTANEOUS | 3 refills | Status: DC
Start: 2023-04-01 — End: 2023-09-30

## 2023-04-01 NOTE — Patient Instructions (Addendum)
Please have your Shingrix (shingles) shots done at your local pharmacy, and the Tdap at the pharmacy  Please take all new medication as prescribed - the ozempic  Please continue all other medications as before, and refills have been done if requested.  Please have the pharmacy call with any other refills you may need.  Please continue your efforts at being more active, low cholesterol diet, and weight control.  You are otherwise up to date with prevention measures today.  Please keep your appointments with your specialists as you may have planned  Please make an Appointment to return in 6 months, or sooner if needed

## 2023-04-01 NOTE — Progress Notes (Unsigned)
Patient ID: Alexandra Wolfe, female   DOB: [birth year], 77 y.o.   MRN: [id number]         Chief Complaint:: wellness exam and Medical Management of Chronic Issues (6 month follow up , wants to talk about getting the ozempic)  , recent uti,dm, htn, low thyroid, hld        HPI:  Alexandra Wolfe is a 77 y.o. female here for wellness exam; for shingrix and tdap at pharmacy, declines colonoscopy, o/w up to date                        Also Pt denies chest pain, increased sob or doe, wheezing, orthopnea, PND, increased LE swelling, palpitations, dizziness or syncope.   Pt denies polydipsia, polyuria, or new focal neuro s/s.    Pt denies fever, wt loss, night sweats, loss of appetite, or other constitutional symptoms  Denies urinary symptoms such as dysuria, frequency, urgency, flank pain, hematuria or n/v, fever, chills.  Hard to lose wt, asking for ozempic start.  Declines labs today.     Wt Readings from Last 3 Encounters:  11/26/22 220 lb (99.8 kg)  08/07/22 230 lb (104.3 kg)  06/10/22 228 lb (103.4 kg)   BP Readings from Last 3 Encounters:  04/01/23 128/78  03/12/23 138/86  12/18/22 138/86   Immunization History  Administered Date(s) Administered   Fluad Quad(high Dose 65+) 12/14/2021, 08/21/2022   H1N1 11/17/2008   Influenza Whole 09/27/2008, 10/04/2009, 08/14/2010   Influenza, High Dose Seasonal PF 09/03/2017, 09/04/2018, 01/07/2020   Influenza, Seasonal, Injecte, Preservative Fre 11/09/2012   Influenza,inj,Quad PF,6+ Mos 11/24/2013, 12/22/2015, 08/22/2016   Moderna Sars-Covid-2 Vaccination 03/15/2020, 04/02/2020   Pneumococcal Conjugate-13 03/01/2015   Pneumococcal Polysaccharide-23 09/27/2008, 11/09/2012   Tdap 11/09/2012   Health Maintenance Due  Topic Date Due   Zoster Vaccines- Shingrix (1 of 2) Never done   DTaP/Tdap/Td (2 - Td or Tdap) 11/09/2022   Diabetic kidney evaluation - Urine ACR  12/14/2022   Medicare Annual Wellness (AWV)  03/27/2023      Past Medical History:   Diagnosis Date   ALLERGIC RHINITIS 04/02/2009   no per pt   Anxiety    BACK PAIN 09/27/2008   BUNIONS, BILATERAL 12/23/2007   CHEST DISCOMFORT, ATYPICAL 11/07/2009   Chronic LBP    COLONIC POLYPS, HX OF 09/13/2007   CONSTIPATION 09/27/2008   DEPRESSION 09/13/2007   Diabetes mellitus    diet controlled   DYSPNEA ON EXERTION 02/06/2010   Eustachian tube dysfunction 05/20/2011   FOOT PAIN, RIGHT 09/27/2008   HYPERLIPIDEMIA 03/11/2008   LBP (low back pain) 05/20/2011   Leukocytosis 11/20/2011   OSTEOARTHROSIS NOS, LOWER LEG 09/13/2007   SHOULDER PAIN, LEFT 02/14/2009   SVT (supraventricular tachycardia)    SYMPTOM, PALPITATIONS 09/13/2007   URINARY INCONTINENCE 08/23/2009   Vertigo 05/20/2011   Past Surgical History:  Procedure Laterality Date   BUNIONECTOMY     right   ccx     CHOLECYSTECTOMY     LUMBAR LAMINECTOMY     LUMBAR LAMINECTOMY     SHOULDER ARTHROSCOPY  12/13/2011   Procedure: ARTHROSCOPY SHOULDER;  Surgeon: Verlee Rossetti;  Location: [facility] OR;  Service: Orthopedics;  Laterality: Left;  Left Shoulder ArthroscopyDebridement Limited Tenodesis Open Rotator Cuff Repair Spur Removal Right Shoulder Injection     reports that she has quit smoking. Her smoking use included cigarettes. She has a 10.00 pack-year smoking history. She has never used smokeless tobacco. She reports that  she does not drink alcohol and does not use drugs. family history includes Colon cancer in her mother; Diabetes in an other family member; Diabetes type II in her father; Heart disease in her brother and father. Allergies  Allergen Reactions   Aripiprazole     REACTION: agitation   Simvastatin     REACTION: myalgia   Current Outpatient Medications on File Prior to Visit  Medication Sig Dispense Refill   10894 Inhale 2 puffs into the lungs 2 (two) times daily.     [address] PLACE 1 DROP INTO AFFECTED EYE QID.     acetaminophen (TYLENOL) 325 MG tablet Take 2 tablets (650 mg total) by mouth every 6 (six) hours as  needed for mild pain or headache.     albuterol (VENTOLIN HFA) 108 (90 Base) MCG/ACT inhaler INHALE 2 PUFFS INTO THE LUNGS EVERY 6 HOURS AS NEEDED FOR WHEEZING OR SHORTNESS OF BREATH 8.5 g 7   aspirin 81 MG EC tablet Take by mouth.     atorvastatin (LIPITOR) 80 MG tablet TAKE ONE TABLET DAILY 90 tablet 3   budesonide-formoterol (SYMBICORT) 160-4.5 MCG/ACT inhaler Inhale 2 puffs into the lungs 2 (two) times daily. 6 g 2   Calcium Carbonate (CALCIUM 500 PO) Take 1,000 mg by mouth 2 (two) times daily.      cetirizine (ZYRTEC) 10 MG tablet Take 1 tablet (10 mg total) by mouth daily. 90 tablet 3   citalopram (CELEXA) 40 MG tablet Take 1 tablet (40 mg total) by mouth daily. 90 tablet 3   ergocalciferol (VITAMIN D2) 1.25 MG (50000 UT) capsule Take 1 tablet by mouth daily.     fluticasone (FLONASE) 50 MCG/ACT nasal spray SHAKE LIQUID AND USE 2 SPRAYS IN EACH NOSTRIL DAILY 48 g 1   gabapentin (NEURONTIN) 100 MG capsule Take 1 capsule (100 mg total) by mouth 3 (three) times daily. 270 capsule 1   glucose blood (ONETOUCH ULTRA) test strip Use as instructed three times daily E11.9 300 each 12   glucose blood test strip OneTouch Verio test strips  USE AS DIRECTED THREE TIMES DAILY     guaiFENesin (MUCINEX) 600 MG 12 hr tablet Take 2 tablets (1,200 mg total) by mouth 2 (two) times daily as needed. 60 tablet 1   levETIRAcetam (KEPPRA) 500 MG tablet Take 1 tablet (500 mg total) by mouth 2 (two) times daily. 180 tablet 1   levothyroxine (SYNTHROID) 50 MCG tablet TAKE 1 TABLET(50 MCG) BY MOUTH DAILY. NEEDS APPOINTMENT 90 tablet 3   meclizine (ANTIVERT) 25 MG tablet Take 1 tablet (25 mg total) by mouth 3 (three) times daily as needed for dizziness. 90 tablet 0   meloxicam (MOBIC) 15 MG tablet TAKE 1 TABLET(15 MG) BY MOUTH DAILY 90 tablet 0   metFORMIN (GLUCOPHAGE) 500 MG tablet TAKE ONE TABLET TWICE DAILY WITH A MEAL 180 tablet 2   metoprolol succinate (TOPROL-XL) 25 MG 24 hr tablet TAKE 1 TABLET BY MOUTH DAILY. 90  tablet 3   Multiple Vitamin (MULTIVITAMIN WITH MINERALS) TABS tablet Take 1 tablet by mouth daily.     MYRBETRIQ 50 MG TB24 tablet Take 1 tablet (50 mg total) by mouth at bedtime. 30 tablet 0   NON FORMULARY Heart healthy lcs diet     NP THYROID 30 MG tablet Take 1 tablet (30 mg total) by mouth daily. 30 tablet 0   ondansetron (ZOFRAN) 4 MG tablet Take 1 tablet (4 mg total) by mouth every 8 (eight) hours as needed for nausea or vomiting.  30 tablet 0   ondansetron (ZOFRAN-ODT) 4 MG disintegrating tablet Take 1 tablet (4 mg total) by mouth every 8 (eight) hours as needed for nausea or vomiting. 30 tablet 1   oxybutynin (DITROPAN-XL) 10 MG 24 hr tablet Take by mouth.     pantoprazole (PROTONIX) 40 MG tablet Take 1 tablet (40 mg total) by mouth daily. 90 tablet 3   polyethylene glycol powder (GLYCOLAX/MIRALAX) 17 GM/SCOOP powder Take 17 g by mouth 2 (two) times daily as needed. 3350 g 3   senna-docusate (SENOKOT-S) 8.6-50 MG tablet Take 1 tablet by mouth at bedtime as needed for mild constipation.     traZODone (DESYREL) 50 MG tablet TAKE ONE TABLET AT BEDTIME 90 tablet 1   triamcinolone (NASACORT) 55 MCG/ACT AERO nasal inhaler Place 2 sprays into the nose daily. 1 each 12   trospium (SANCTURA) 20 MG tablet Take 20 mg by mouth at bedtime.     No current facility-administered medications on file prior to visit.        ROS:  All others reviewed and negative.  Objective        PE:  BP 128/78 (BP Location: Right Arm, Patient Position: Sitting, Cuff Size: Normal)   Pulse 75   Temp 98.1 F (36.7 C) (Oral)   Ht 5\' 6"  (1.676 m)   SpO2 98%   BMI 35.51 kg/m                 Constitutional: Pt appears in NAD               HENT: Head: NCAT.                Right Ear: External ear normal.                 Left Ear: External ear normal.                Eyes: . Pupils are equal, round, and reactive to light. Conjunctivae and EOM are normal               Nose: without d/c or deformity               Neck:  Neck supple. Gross normal ROM               Cardiovascular: Normal rate and regular rhythm.                 Pulmonary/Chest: Effort normal and breath sounds without rales or wheezing.                Abd:  Soft, NT, ND, + BS, no organomegaly               Neurological: Pt is alert. At baseline orientation, motor grossly intact               Skin: Skin is warm. No rashes, no other new lesions, LE edema - trace bilateral               Psychiatric: Pt behavior is normal without agitation   Micro: none  Cardiac tracings I have personally interpreted today:  none  Pertinent Radiological findings (summarize): none   Lab Results  Component Value Date   WBC 14.8 (H) 11/26/2022   HGB 13.5 11/26/2022   HCT 40.2 11/26/2022   PLT 391.0 11/26/2022   GLUCOSE 117 (H) 11/26/2022   CHOL 147 11/26/2022   TRIG 160.0 (H) 11/26/2022   HDL 44.20 11/26/2022  LDLDIRECT 75.0 06/10/2022   LDLCALC 71 11/26/2022   ALT 22 11/26/2022   AST 24 11/26/2022   NA 136 11/26/2022   K 4.1 11/26/2022   CL 101 11/26/2022   CREATININE 0.77 11/26/2022   BUN 18 11/26/2022   CO2 22 11/26/2022   TSH 2.70 11/26/2022   INR 1.1 05/09/2020   HGBA1C 6.7 (H) 11/26/2022   MICROALBUR 0.4 12/14/2021   Assessment/Plan:  GUSTA MARKSBERRY is a 77 y.o. White or Caucasian [1] female with  has a past medical history of ALLERGIC RHINITIS (04/02/2009), Anxiety, BACK PAIN (09/27/2008), BUNIONS, BILATERAL (12/23/2007), CHEST DISCOMFORT, ATYPICAL (11/07/2009), Chronic LBP, COLONIC POLYPS, HX OF (09/13/2007), CONSTIPATION (09/27/2008), DEPRESSION (09/13/2007), Diabetes mellitus, DYSPNEA ON EXERTION (02/06/2010), Eustachian tube dysfunction (05/20/2011), FOOT PAIN, RIGHT (09/27/2008), HYPERLIPIDEMIA (03/11/2008), LBP (low back pain) (05/20/2011), Leukocytosis (11/20/2011), OSTEOARTHROSIS NOS, LOWER LEG (09/13/2007), SHOULDER PAIN, LEFT (02/14/2009), SVT (supraventricular tachycardia), SYMPTOM, PALPITATIONS (09/13/2007), URINARY INCONTINENCE (08/23/2009),  and Vertigo (05/20/2011).  Encounter for well adult exam with abnormal findings Age and sex appropriate education and counseling updated with regular exercise and diet Referrals for preventative services - declines colonoscopy Immunizations addressed - for tdap and shingrx at pharmacy Smoking counseling  - none needed Evidence for depression or other mood disorder - none significant Most recent labs reviewed. I have personally reviewed and have noted: 1) the patient's medical and social history 2) The patient's current medications and supplements 3) The patient's height, weight, and BMI have been recorded in the chart   Diabetes mellitus type II, non insulin dependent (HCC) Lab Results  Component Value Date   HGBA1C 6.7 (H) 11/26/2022   Mild uncontrolled,, pt to continue current medical treatment metformin 500 mg bid, and add ozempic 0.25 mg weekly   Essential hypertension BP Readings from Last 3 Encounters:  04/01/23 128/78  03/12/23 138/86  12/18/22 138/86   Stable, pt to continue medical treatment toprol xl 25 qd   Hyperlipidemia Lab Results  Component Value Date   LDLCALC 71 11/26/2022   Uncontrolled, goal ldl  70, , pt to continue current statin liptior 80 mg and lower chol diet, for f/u lab next visit ow/ declines change today   Hypothyroidism Lab Results  Component Value Date   TSH 2.70 11/26/2022   Stable, pt to continue levothyroxine 50 mcg qd  Followup: Return in about 6 months (around 10/01/2023).  Oliver Barre, MD 04/03/2023 6:15 PM [facility]

## 2023-04-03 ENCOUNTER — Encounter: Payer: Self-pay | Admitting: Internal Medicine

## 2023-04-03 NOTE — Assessment & Plan Note (Signed)
Lab Results  Component Value Date   HGBA1C 6.7 (H) 11/26/2022   Mild uncontrolled,, pt to continue current medical treatment metformin 500 mg bid, and add ozempic 0.25 mg weekly

## 2023-04-03 NOTE — Assessment & Plan Note (Signed)
Lab Results  Component Value Date   LDLCALC 71 11/26/2022   Uncontrolled, goal ldl  70, , pt to continue current statin liptior 80 mg and lower chol diet, for f/u lab next visit ow/ declines change today

## 2023-04-03 NOTE — Assessment & Plan Note (Signed)
BP Readings from Last 3 Encounters:  04/01/23 128/78  03/12/23 138/86  12/18/22 138/86   Stable, pt to continue medical treatment toprol xl 25 qd

## 2023-04-03 NOTE — Assessment & Plan Note (Signed)
Age and sex appropriate education and counseling updated with regular exercise and diet Referrals for preventative services - declines colonoscopy Immunizations addressed - for tdap and shingrx at pharmacy Smoking counseling  - none needed Evidence for depression or other mood disorder - none significant Most recent labs reviewed. I have personally reviewed and have noted: 1) the patient's medical and social history 2) The patient's current medications and supplements 3) The patient's height, weight, and BMI have been recorded in the chart  

## 2023-04-03 NOTE — Assessment & Plan Note (Signed)
Lab Results  Component Value Date   TSH 2.70 11/26/2022   Stable, pt to continue levothyroxine 50 mcg qd

## 2023-04-17 ENCOUNTER — Telehealth: Payer: Self-pay

## 2023-04-17 NOTE — Telephone Encounter (Signed)
PA approved.   Approved. . Authorization Expiration Date: Apr 16, 2024.

## 2023-04-17 NOTE — Telephone Encounter (Signed)
PA initiated via Covermymeds; KEY:  [id number]. Awaiting determination.

## 2023-04-28 ENCOUNTER — Other Ambulatory Visit: Payer: Self-pay | Admitting: Internal Medicine

## 2023-04-28 DIAGNOSIS — J309 Allergic rhinitis, unspecified: Secondary | ICD-10-CM

## 2023-06-19 DIAGNOSIS — R350 Frequency of micturition: Secondary | ICD-10-CM | POA: Diagnosis not present

## 2023-06-19 DIAGNOSIS — N3946 Mixed incontinence: Secondary | ICD-10-CM | POA: Diagnosis not present

## 2023-06-23 ENCOUNTER — Telehealth: Payer: Self-pay

## 2023-06-25 ENCOUNTER — Ambulatory Visit (INDEPENDENT_AMBULATORY_CARE_PROVIDER_SITE_OTHER): Payer: Medicare Other

## 2023-06-25 DIAGNOSIS — Z0000 Encounter for general adult medical examination without abnormal findings: Secondary | ICD-10-CM

## 2023-06-25 NOTE — Telephone Encounter (Signed)
error 

## 2023-06-25 NOTE — Progress Notes (Signed)
Subjective:   Alexandra Wolfe is a 77 y.o. female who presents for Medicare Annual (Subsequent) preventive examination.  Visit Complete: Virtual  I connected with  [name] on 06/25/23 by a audio enabled telemedicine application and verified that I am speaking with the correct person using two identifiers.  Patient Location: Home  Provider Location: Office/Clinic  I discussed the limitations of evaluation and management by telemedicine. The patient expressed understanding and agreed to proceed.   Review of Systems    Defer to PCP Cardiac Risk Factors include: advanced age (>31men, >83 women);diabetes mellitus;hypertension;dyslipidemia      Objective:    Today's Vitals   06/25/23 1131  PainSc: 8    Patient was unable to self-report due to a lack of equipment at home via telehealth.  There is no height or weight on file to calculate BMI.     06/25/2023    1:19 PM 03/26/2022   10:19 AM 01/22/2022    1:10 PM 12/23/2021   12:54 PM 11/10/2021    1:53 PM 03/21/2021    3:31 PM 03/21/2020   11:36 AM  Advanced Directives  Does Patient Have a Medical Advance Directive? Yes Yes No No No Yes Yes  Type of Advance Directive Living will Living will;Healthcare Power of [employer] Power of Attorney --  Does patient want to make changes to medical advance directive? No - Patient declined No - Patient declined     No - Patient declined  Copy of Healthcare Power of Attorney in Chart?  No - copy requested    No - copy requested   Would patient like information on creating a medical advance directive?    No - Patient declined No - Patient declined      Current Medications (verified) Outpatient Encounter Medications as of 06/25/2023  Medication Sig   10894 Inhale 2 puffs into the lungs 2 (two) times daily.   82956 PLACE 1 DROP INTO AFFECTED EYE QID.   acetaminophen (TYLENOL) 325 MG tablet Take 2 tablets (650 mg total) by mouth every 6 (six) hours as needed for mild pain or  headache.   albuterol (VENTOLIN HFA) 108 (90 Base) MCG/ACT inhaler INHALE 2 PUFFS INTO THE LUNGS EVERY 6 HOURS AS NEEDED FOR WHEEZING OR SHORTNESS OF BREATH   aspirin 81 MG EC tablet Take by mouth.   atorvastatin (LIPITOR) 80 MG tablet TAKE ONE TABLET DAILY   budesonide-formoterol (SYMBICORT) 160-4.5 MCG/ACT inhaler Inhale 2 puffs into the lungs 2 (two) times daily.   Calcium Carbonate (CALCIUM 500 PO) Take 1,000 mg by mouth 2 (two) times daily.    cetirizine (ZYRTEC) 10 MG tablet TAKE ONE TABLET DAILY   citalopram (CELEXA) 40 MG tablet Take 1 tablet (40 mg total) by mouth daily.   ergocalciferol (VITAMIN D2) 1.25 MG (50000 UT) capsule Take 1 tablet by mouth daily.   fluticasone (FLONASE) 50 MCG/ACT nasal spray SHAKE LIQUID AND USE 2 SPRAYS IN EACH NOSTRIL DAILY   gabapentin (NEURONTIN) 100 MG capsule TAKE 1 CAPSULE (100 MG TOTAL) BY MOUTH THREE TIMES DAILY.   glucose blood (ONETOUCH ULTRA) test strip Use as instructed three times daily E11.9   glucose blood test strip OneTouch Verio test strips  USE AS DIRECTED THREE TIMES DAILY   guaiFENesin (MUCINEX) 600 MG 12 hr tablet Take 2 tablets (1,200 mg total) by mouth 2 (two) times daily as needed.   levETIRAcetam (KEPPRA) 500 MG tablet Take 1 tablet (500 mg total) by mouth 2 (  two) times daily.   levothyroxine (SYNTHROID) 50 MCG tablet TAKE 1 TABLET(50 MCG) BY MOUTH DAILY. NEEDS APPOINTMENT   meclizine (ANTIVERT) 25 MG tablet Take 1 tablet (25 mg total) by mouth 3 (three) times daily as needed for dizziness.   meloxicam (MOBIC) 15 MG tablet TAKE 1 TABLET(15 MG) BY MOUTH DAILY   metFORMIN (GLUCOPHAGE) 500 MG tablet TAKE ONE TABLET TWICE DAILY WITH A MEAL   metoprolol succinate (TOPROL-XL) 25 MG 24 hr tablet TAKE 1 TABLET BY MOUTH DAILY.   Multiple Vitamin (MULTIVITAMIN WITH MINERALS) TABS tablet Take 1 tablet by mouth daily.   MYRBETRIQ 50 MG TB24 tablet Take 1 tablet (50 mg total) by mouth at bedtime.   NON FORMULARY Heart healthy lcs diet   NP  THYROID 30 MG tablet Take 1 tablet (30 mg total) by mouth daily.   ondansetron (ZOFRAN) 4 MG tablet Take 1 tablet (4 mg total) by mouth every 8 (eight) hours as needed for nausea or vomiting.   ondansetron (ZOFRAN-ODT) 4 MG disintegrating tablet Take 1 tablet (4 mg total) by mouth every 8 (eight) hours as needed for nausea or vomiting.   oxybutynin (DITROPAN-XL) 10 MG 24 hr tablet Take by mouth.   pantoprazole (PROTONIX) 40 MG tablet Take 1 tablet (40 mg total) by mouth daily.   polyethylene glycol powder (GLYCOLAX/MIRALAX) 17 GM/SCOOP powder Take 17 g by mouth 2 (two) times daily as needed.   Semaglutide,0.25 or 0.5MG /DOS, (OZEMPIC, 0.25 OR 0.5 MG/DOSE,) 2 MG/3ML SOPN Take 0.25 mg subq once weekly   senna-docusate (SENOKOT-S) 8.6-50 MG tablet Take 1 tablet by mouth at bedtime as needed for mild constipation.   traZODone (DESYREL) 50 MG tablet TAKE ONE TABLET AT BEDTIME   triamcinolone (NASACORT) 55 MCG/ACT AERO nasal inhaler Place 2 sprays into the nose daily.   trospium (SANCTURA) 20 MG tablet Take 20 mg by mouth at bedtime.   No facility-administered encounter medications on file as of 06/25/2023.    Allergies (verified) Aripiprazole and Simvastatin   History: Past Medical History:  Diagnosis Date   ALLERGIC RHINITIS 04/02/2009   no per pt   Anxiety    BACK PAIN 09/27/2008   BUNIONS, BILATERAL 12/23/2007   CHEST DISCOMFORT, ATYPICAL 11/07/2009   Chronic LBP    COLONIC POLYPS, HX OF 09/13/2007   CONSTIPATION 09/27/2008   DEPRESSION 09/13/2007   Diabetes mellitus    diet controlled   DYSPNEA ON EXERTION 02/06/2010   Eustachian tube dysfunction 05/20/2011   FOOT PAIN, RIGHT 09/27/2008   HYPERLIPIDEMIA 03/11/2008   LBP (low back pain) 05/20/2011   Leukocytosis 11/20/2011   OSTEOARTHROSIS NOS, LOWER LEG 09/13/2007   SHOULDER PAIN, LEFT 02/14/2009   SVT (supraventricular tachycardia)    SYMPTOM, PALPITATIONS 09/13/2007   URINARY INCONTINENCE 08/23/2009   Vertigo 05/20/2011   Past Surgical  History:  Procedure Laterality Date   BUNIONECTOMY     right   ccx     CHOLECYSTECTOMY     LUMBAR LAMINECTOMY     LUMBAR LAMINECTOMY     SHOULDER ARTHROSCOPY  12/13/2011   Procedure: ARTHROSCOPY SHOULDER;  Surgeon: Verlee Rossetti;  Location: [facility] OR;  Service: Orthopedics;  Laterality: Left;  Left Shoulder ArthroscopyDebridement Limited Tenodesis Open Rotator Cuff Repair Spur Removal Right Shoulder Injection    Family History  Problem Relation Age of Onset   Colon cancer Mother    Heart disease Father    Diabetes type II Father    Heart disease Brother    Diabetes Other  father   Esophageal cancer Neg Hx    Stomach cancer Neg Hx    Rectal cancer Neg Hx    Social History   Socioeconomic History   Marital status: Married    Spouse name: Not on file   Number of children: 2   Years of education: Not on file   Highest education level: Not on file  Occupational History   Occupation: Disabled Psych.  Tobacco Use   Smoking status: Former    Current packs/day: 1.00    Average packs/day: 1 pack/day for 10.0 years (10.0 ttl pk-yrs)    Types: Cigarettes   Smokeless tobacco: Never   Tobacco comments:    Smoked as a teenager. Quit in 1970  Substance and Sexual Activity   Alcohol use: No    Alcohol/week: 0.0 standard drinks of alcohol   Drug use: No   Sexual activity: Never  Other Topics Concern   Not on file  Social History Narrative   Husband chronically ill in nursing home.      Disabled - psychiatric   Social Determinants of Health   Financial Resource Strain: Low Risk  (06/25/2023)   Overall Financial Resource Strain (CARDIA)    Difficulty of Paying Living Expenses: Not hard at all  Food Insecurity: No Food Insecurity (06/25/2023)   Hunger Vital Sign    Worried About Running Out of Food in the Last Year: Never true    Ran Out of Food in the Last Year: Never true  Transportation Needs: No Transportation Needs (03/26/2022)   PRAPARE - Scientist, research (physical sciences) (Medical): No    Lack of Transportation (Non-Medical): No  Physical Activity: Inactive (06/25/2023)   Exercise Vital Sign    Days of Exercise per Week: 0 days    Minutes of Exercise per Session: 0 min  Stress: No Stress Concern Present (06/25/2023)   [employer] of Occupational Health - Occupational Stress Questionnaire    Feeling of Stress : Not at all  Social Connections: Socially Isolated (06/25/2023)   Social Connection and Isolation Panel [NHANES]    Frequency of Communication with Friends and Family: Three times a week    Frequency of Social Gatherings with Friends and Family: Once a week    Attends Religious Services: Never    Database administrator or Organizations: No    Attends Banker Meetings: Never    Marital Status: Widowed    Tobacco Counseling Counseling given: Not Answered Tobacco comments: Smoked as a teenager. Quit in 1970   Clinical Intake:  Pre-visit preparation completed: Yes  Pain : 0-10 Pain Score: 8  Pain Onset: Today Pain Frequency: Occasional     Diabetes: Yes CBG done?: No Did pt. bring in CBG monitor from home?: No (pt not in office)  How often do you need to have someone help you when you read instructions, pamphlets, or other written materials from your doctor or pharmacy?: 2 - Rarely What is the last grade level you completed in school?: 12th grade  Interpreter Needed?: No      Activities of Daily Living    06/25/2023   11:33 AM  In your present state of health, do you have any difficulty performing the following activities:  Hearing? 0  Vision? 0  Difficulty concentrating or making decisions? 0  Walking or climbing stairs? 0  Dressing or bathing? 1  Comment due to mobility  Doing errands, shopping? 0  Preparing Food and eating ? N  Using  the Toilet? N  Do you have problems with loss of bowel control? N  Managing your Medications? N  Managing your Finances? N  Housekeeping or managing your  Housekeeping? N    Patient Care Team: Corwin Levins, MD as PCP - Marin Shutter, MD as Consulting Physician (Ophthalmology)  Indicate any recent Medical Services you may have received from other than [facility] providers in the past year (date may be approximate).     Assessment:   This is a routine wellness examination for [place].  Hearing/Vision screen Vision Screening - Comments:: Sees eye provider  Dietary issues and exercise activities discussed:     Goals Addressed               This Visit's Progress     Patient Stated (pt-stated)        She wants to work on mobility be able to walk and not fall and be back to driving       Depression Screen    06/25/2023    1:18 PM 06/25/2023   11:29 AM 04/01/2023    2:03 PM 09/09/2022    4:43 PM 08/07/2022   10:31 AM 06/10/2022   10:02 AM 03/26/2022   10:23 AM  PHQ 2/9 Scores  PHQ - 2 Score 0 0 0 2 1 0 0  PHQ- 9 Score   8 7 12       Fall Risk    06/25/2023   11:29 AM 04/01/2023    2:03 PM 08/07/2022   10:32 AM 06/10/2022   10:02 AM 03/26/2022   10:21 AM  Fall Risk   Falls in the past year? 0 1 1 1 1   Number falls in past yr: 0 1 1 1  0  Injury with Fall? 0 0 1 1 1   Risk for fall due to : History of fall(s) History of fall(s) Other (Comment)    Follow up Falls evaluation completed Falls evaluation completed Falls evaluation completed      MEDICARE RISK AT HOME:  Medicare Risk at Home - 06/25/23 1319     Any stairs in or around the home? No    If so, are there any without handrails? No    Home free of loose throw rugs in walkways, pet beds, electrical cords, etc? Yes    Adequate lighting in your home to reduce risk of falls? Yes    Life alert? Yes    Use of a cane, walker or w/c? Yes    Grab bars in the bathroom? Yes    Shower chair or bench in shower? Yes    Elevated toilet seat or a handicapped toilet? Yes             TIMED UP AND GO:  Was the test performed?  No    Cognitive Function:    03/21/2021     3:35 PM 12/19/2015    4:14 PM  MMSE - Mini Mental State Exam  Not completed: Unable to complete Unable to complete        06/25/2023   11:30 AM 03/26/2022   10:28 AM  6CIT Screen  What Year? 0 points 0 points  What month? 0 points 0 points  What time? 0 points 0 points  Count back from 20 0 points 0 points  Months in reverse 0 points 0 points  Repeat phrase 0 points 0 points  Total Score 0 points 0 points    Immunizations Immunization History  Administered Date(s) Administered   [employer]  Quad(high Dose 65+) 12/14/2021, 08/21/2022   H1N1 11/17/2008   Influenza Whole 09/27/2008, 10/04/2009, 08/14/2010   Influenza, High Dose Seasonal PF 09/03/2017, 09/04/2018, 01/07/2020   Influenza, Seasonal, Injecte, Preservative Fre 11/09/2012   Influenza,inj,Quad PF,6+ Mos 11/24/2013, 12/22/2015, 08/22/2016   Moderna Sars-Covid-2 Vaccination 03/15/2020, 04/02/2020   Pneumococcal Conjugate-13 03/01/2015   Pneumococcal Polysaccharide-23 09/27/2008, 11/09/2012   Tdap 11/09/2012    TDAP status: Due, Education has been provided regarding the importance of this vaccine. Advised may receive this vaccine at local pharmacy or Health Dept. Aware to provide a copy of the vaccination record if obtained from local pharmacy or Health Dept. Verbalized acceptance and understanding.  Flu Vaccine status: Up to date  Pneumococcal vaccine status: Up to date  Covid-19 vaccine status: Completed vaccines  Qualifies for Shingles Vaccine? Yes   Zostavax completed No   Shingrix Completed?: No.    Education has been provided regarding the importance of this vaccine. Patient has been advised to call insurance company to determine out of pocket expense if they have not yet received this vaccine. Advised may also receive vaccine at local pharmacy or Health Dept. Verbalized acceptance and understanding.  Screening Tests Health Maintenance  Topic Date Due   Zoster Vaccines- Shingrix (1 of 2) Never done   DTaP/Tdap/Td  (2 - Td or Tdap) 11/09/2022   Diabetic kidney evaluation - Urine ACR  12/14/2022   OPHTHALMOLOGY EXAM  05/14/2023   HEMOGLOBIN A1C  05/28/2023   Colonoscopy  08/08/2023 (Originally 03/20/2022)   INFLUENZA VACCINE  07/10/2023   Diabetic kidney evaluation - eGFR measurement  11/27/2023   FOOT EXAM  03/31/2024   Medicare Annual Wellness (AWV)  06/24/2024   Pneumonia Vaccine 33+ Years old  Completed   DEXA SCAN  Completed   Hepatitis C Screening  Completed   HPV VACCINES  Aged Out   COVID-19 Vaccine  Discontinued    Health Maintenance  Health Maintenance Due  Topic Date Due   Zoster Vaccines- Shingrix (1 of 2) Never done   DTaP/Tdap/Td (2 - Td or Tdap) 11/09/2022   Diabetic kidney evaluation - Urine ACR  12/14/2022   OPHTHALMOLOGY EXAM  05/14/2023   HEMOGLOBIN A1C  05/28/2023    Colorectal cancer screening: Type of screening: Colonoscopy. Completed 03/20/12. Repeat every 10 years  Mammogram status: Ordered 06/25/23. Pt provided with contact info and advised to call to schedule appt.   Bone Density status: Completed 04/26/16. Results reflect: Bone density results: NORMAL. Repeat every N/A years.  Lung Cancer Screening: (Low Dose CT Chest recommended if Age 35-80 years, 20 pack-year currently smoking OR have quit w/in 15years.) does not qualify.   Lung Cancer Screening Referral: N/A  Additional Screening:  Hepatitis C Screening: does qualify; Completed 12/23/15  Vision Screening: Recommended annual ophthalmology exams for early detection of glaucoma and other disorders of the eye. Is the patient up to date with their annual eye exam?  Yes  Who is the provider or what is the name of the office in which the patient attends annual eye exams? [facility]  If pt is not established with a provider, would they like to be referred to a provider to establish care? No .   Dental Screening: Recommended annual dental exams for proper oral hygiene  Diabetic Foot Exam:  Diabetic Foot Exam: Completed 04/01/23  Nutrition Risk Assessment:  Has the patient had any N/V/D within the last 2 months?  Yes  Does the patient have any non-healing wounds?  No  Has the patient  had any unintentional weight loss or weight gain?  No   Diabetes:  Is the patient diabetic?  Yes  If diabetic, was a CBG obtained today?  No  Did the patient bring in their glucometer from home?  No  How often do you monitor your CBG's? Everyday.   Financial Strains and Diabetes Management:  Are you having any financial strains with the device, your supplies or your medication? No .  Does the patient want to be seen by Chronic Care Management for management of their diabetes?  No  Would the patient like to be referred to a Nutritionist or for Diabetic Management?  No   Diabetic Exams:  Diabetic Eye Exam: Overdue for diabetic eye exam. Pt has been advised about the importance in completing this exam. Patient advised to call and schedule an eye exam. Diabetic Foot Exam: Completed 04/01/23    Community Resource Referral / Chronic Care Management: CRR required this visit?  No   CCM required this visit?  No     Plan:     I have personally reviewed and noted the following in the patient's chart:   Medical and social history Use of alcohol, tobacco or illicit drugs  Current medications and supplements including opioid prescriptions. Patient is not currently taking opioid prescriptions. Functional ability and status Nutritional status Physical activity Advanced directives List of other physicians Hospitalizations, surgeries, and ER visits in previous 12 months Vitals Screenings to include cognitive, depression, and falls Referrals and appointments  In addition, I have reviewed and discussed with patient certain preventive protocols, quality metrics, and best practice recommendations. A written personalized care plan for preventive services as well as general preventive health  recommendations were provided to patient.     Young Berry Haru Anspaugh, CMA   06/25/2023   After Visit Summary: (Declined) Due to this being a telephonic visit, with patients personalized plan was offered to patient but patient Declined AVS at this time   Nurse Notes:  Ms. Winning , Thank you for taking time to come for your Medicare Wellness Visit. I appreciate your ongoing commitment to your health goals. Please review the following plan we discussed and let me know if I can assist you in the future.   These are the goals we discussed:  Goals       fup depressed mood (pt-stated)      The patient discussed recent losses and admitted current environment is not in a particularly safe area; The patient set a goal of reaching out to church for assistance with move to a safer neighborhood;  States she has asked for help from the church at an earlier time and has high level of confidence that will receive help.   2nd goal is to make apt with dr. Jonny Ruiz (scheduled Friday at 2:30) for discussion and fup for depression;   3rd. Will outreach [facility] if she feels she needs to process grief.       Patient Stated (pt-stated)      Continue to loss weight.  I would like to lose 10 more pounds.      Patient Stated (pt-stated)      She wants to work on mobility be able to walk and not fall and be back to driving       Patient Stated she receives physical therapy session one time weekly in home (pt-stated)      Care Coordination:   Informed client about Care Coordination program support Talked with client about her  receiving physical therapy session one time weekly in the home Talked with client about support from her PCP Dr. Corwin Levins Encouraged client to talk with PCP about Care Coordination program support        This is a list of the screening recommended for you and due dates:  Health Maintenance  Topic Date Due   Zoster (Shingles) Vaccine (1 of 2) Never done   DTaP/Tdap/Td  vaccine (2 - Td or Tdap) 11/09/2022   Yearly kidney health urinalysis for diabetes  12/14/2022   Eye exam for diabetics  05/14/2023   Hemoglobin A1C  05/28/2023   Colon Cancer Screening  08/08/2023*   Flu Shot  07/10/2023   Yearly kidney function blood test for diabetes  11/27/2023   Complete foot exam   03/31/2024   Medicare Annual Wellness Visit  06/24/2024   Pneumonia Vaccine  Completed   DEXA scan (bone density measurement)  Completed   Hepatitis C Screening  Completed   HPV Vaccine  Aged Out   COVID-19 Vaccine  Discontinued  *Topic was postponed. The date shown is not the original due date.

## 2023-07-29 DIAGNOSIS — I10 Essential (primary) hypertension: Secondary | ICD-10-CM | POA: Diagnosis not present

## 2023-07-29 DIAGNOSIS — F330 Major depressive disorder, recurrent, mild: Secondary | ICD-10-CM | POA: Diagnosis not present

## 2023-08-05 ENCOUNTER — Encounter: Payer: Self-pay | Admitting: Family Medicine

## 2023-08-05 ENCOUNTER — Other Ambulatory Visit: Payer: Self-pay

## 2023-08-05 ENCOUNTER — Ambulatory Visit (INDEPENDENT_AMBULATORY_CARE_PROVIDER_SITE_OTHER): Payer: Medicare Other | Admitting: Family Medicine

## 2023-08-05 VITALS — BP 110/76 | HR 90 | Ht 66.0 in | Wt 227.0 lb

## 2023-08-05 DIAGNOSIS — G8929 Other chronic pain: Secondary | ICD-10-CM

## 2023-08-05 DIAGNOSIS — M25562 Pain in left knee: Secondary | ICD-10-CM | POA: Diagnosis not present

## 2023-08-05 DIAGNOSIS — M170 Bilateral primary osteoarthritis of knee: Secondary | ICD-10-CM | POA: Diagnosis not present

## 2023-08-05 DIAGNOSIS — M25561 Pain in right knee: Secondary | ICD-10-CM | POA: Diagnosis not present

## 2023-08-05 MED ORDER — HYALURONAN 30 MG/2ML IX SOSY
30.0000 mg | PREFILLED_SYRINGE | Freq: Once | INTRA_ARTICULAR | Status: AC
Start: 2023-08-05 — End: 2023-08-05
  Administered 2023-08-05: 30 mg via INTRA_ARTICULAR

## 2023-08-05 NOTE — Patient Instructions (Addendum)
Thank you for coming in today.   You received an injection today. Seek immediate medical attention if the joint becomes red, extremely painful, or is oozing fluid.   Schedule the 2nd Orthovisc injection for next week and the 3rd for the week after that. 

## 2023-08-05 NOTE — Progress Notes (Signed)
I, Stevenson Clinch, CMA acting as a scribe for [name], MD.  Alexandra Wolfe is a 77 y.o. female who presents to [facility] at [facility] today for cont'd bilat knee pain. Pt was last seen by Dr. [name] on 03/12/23 and was given bilat knee steroid injections.  Today, pt reports worsening knee pain bilaterally. Locates pain to joint line, lateral aspect, and peripatellar regions. Notes that the right knee has been giving out more recently. Notes n/t in B LE, has neuropathy. Swelling present bilaterally. Would like to discuss procedure for the bladder, if it will affect her knees. Would also like to discuss injections for the knees - steroid vs visco; may have difficulty getting to the office weekly for 3 weeks d/t other appointments and transportation.   Dx imaging: 12/04/22 R & L knee   12/23/21 R & L knee XR             03/02/20 R & L knee XR  Pertinent review of systems: No fevers or chills  Relevant historical information: Hypertension and diabetes.  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   MSK: Knees bilaterally decreased range of motion.    Lab and Radiology Results  Alexandra Wolfe presents to clinic today for Orthovisc injection bilateral knee 1/3 Procedure: Real-time Ultrasound Guided Injection of right knee anterior medial approach Device: Philips Affiniti 50G/GE Logiq Images permanently stored and available for review in PACS Verbal informed consent obtained.  Discussed risks and benefits of procedure. Warned about infection, bleeding, damage to structures among others. Patient expresses understanding and agreement Time-out conducted.   Noted no overlying erythema, induration, or other signs of local infection.   Skin prepped in a sterile fashion.   Local anesthesia:  Topical Ethyl chloride.   With sterile technique and under real time ultrasound guidance: Orthovisc 30 mg injected into knee joint. Fluid seen entering the joint capsule.   Completed without difficulty   Advised to call if fevers/chills, erythema, induration, drainage, or persistent bleeding.   Images permanently stored and available for review in the ultrasound unit.  Impression: Technically successful ultrasound guided injection.  Procedure: Real-time Ultrasound Guided Injection of left knee anterior medial approach Device: Philips Affiniti 50G/GE Logiq Images permanently stored and available for review in PACS Verbal informed consent obtained.  Discussed risks and benefits of procedure. Warned about infection, bleeding, damage to structures among others. Patient expresses understanding and agreement Time-out conducted.   Noted no overlying erythema, induration, or other signs of local infection.   Skin prepped in a sterile fashion.   Local anesthesia: Topical Ethyl chloride.   With sterile technique and under real time ultrasound guidance: Orthovisc 30 mg injected into knee joint. Fluid seen entering the joint capsule.   Completed without difficulty   Advised to call if fevers/chills, erythema, induration, drainage, or persistent bleeding.   Images permanently stored and available for review in the ultrasound unit.  Impression: Technically successful ultrasound guided injection. Lot number: [id number] for both injections      Assessment and Plan: 77 y.o. female with bilateral knee pain due to DJD.  Plan for Orthovisc injection series starting today.  She had increased pain with the anterior medial approach.  Will attempt lateral superior  patellar approach at the next visit.  Recheck back in 1 week for Orthovisc injection 2/3.   PDMP not reviewed this encounter. Orders Placed This Encounter  Procedures   [place] LIMITED JOINT SPACE STRUCTURES LOW BILAT(NO LINKED CHARGES)    Order Specific  Question:   Reason for Exam (SYMPTOM  OR DIAGNOSIS REQUIRED)    Answer:   bilateral knee pain    Order Specific Question:   Preferred imaging location?    Answer:   [facility] ordered this encounter  Medications   Hyaluronan (ORTHOVISC) intra-articular injection 30 mg   Hyaluronan (ORTHOVISC) intra-articular injection 30 mg     Discussed warning signs or symptoms. Please see discharge instructions. Patient expresses understanding.   The above documentation has been reviewed and is accurate and complete [name], M.D.

## 2023-08-08 LAB — LAB REPORT - SCANNED
Creatinine, POC: 0.77 mg/dL
EGFR: 79

## 2023-08-13 NOTE — Telephone Encounter (Signed)
Scheduled for Orthovisc injections 08/2023.

## 2023-08-14 ENCOUNTER — Telehealth: Payer: Self-pay | Admitting: Family Medicine

## 2023-08-14 NOTE — Telephone Encounter (Signed)
Zilretta labeled.

## 2023-08-14 NOTE — Telephone Encounter (Signed)
Can we please reserve 2 Zilrettas for her tomorrow?  Spoke to pt and explained that the gel shots take awhile and require the additional 2nd and 3rd injection before you might see benefit. She states that she didn't realize this. I informed her that her insurance has approved her for Zilretta, so they could discuss at tomorrow's visit how to proceed. She verbalized understanding and confirmed her appointment time.

## 2023-08-14 NOTE — Telephone Encounter (Signed)
Patient called stating that since having the first gel injection, she has been having a lot of pain and she does not wish to continue the gel series and instead would like to do the "other injection".  Please advise.  She is currently scheduled for tomorrow.

## 2023-08-15 ENCOUNTER — Ambulatory Visit: Payer: Medicare Other | Admitting: Family Medicine

## 2023-08-15 ENCOUNTER — Other Ambulatory Visit: Payer: Self-pay

## 2023-08-15 VITALS — BP 110/76 | HR 90 | Ht 66.0 in

## 2023-08-15 DIAGNOSIS — G8929 Other chronic pain: Secondary | ICD-10-CM

## 2023-08-15 DIAGNOSIS — M25562 Pain in left knee: Secondary | ICD-10-CM

## 2023-08-15 DIAGNOSIS — M170 Bilateral primary osteoarthritis of knee: Secondary | ICD-10-CM

## 2023-08-15 DIAGNOSIS — M25561 Pain in right knee: Secondary | ICD-10-CM | POA: Diagnosis not present

## 2023-08-15 MED ORDER — TRIAMCINOLONE ACETONIDE 32 MG IX SRER
32.0000 mg | Freq: Once | INTRA_ARTICULAR | Status: AC
Start: 2023-08-15 — End: 2023-08-15
  Administered 2023-08-15: 32 mg via INTRA_ARTICULAR

## 2023-08-15 MED ORDER — TRIAMCINOLONE ACETONIDE 32 MG IX SRER
32.0000 mg | Freq: Once | INTRA_ARTICULAR | Status: AC
  Administered 2023-08-15: 32 mg via INTRA_ARTICULAR

## 2023-08-15 NOTE — Patient Instructions (Addendum)
Thank you for coming in today.   You received an injection today. Seek immediate medical attention if the joint becomes red, extremely painful, or is oozing fluid.   Let me know if this is not working and I can refer you to Interventional Radiology for a Genicular Artery Embolization procedure  Tylenol Arthritis  Ibuprofen

## 2023-08-15 NOTE — Progress Notes (Signed)
Rubin Payor, PhD, LAT, ATC acting as a scribe for [name], MD.  Alexandra Wolfe is a 77 y.o. female who presents to [facility] at [facility] today for cont'd bilat knee pain. Pt was last seen by Dr. [name] on 08/05/23 and started the Orthovisc series, 1/3, bilaterally. Pt called the office yesterday, reporting the her knees were still very painful, and she wanted to switched to the Zilretta injections.  The gel injections she notes either did not work at all or even made her left knee more painful.  Dx imaging: 12/04/22 R & L knee   12/23/21 R & L knee XR             03/02/20 R & L knee XR  Pertinent review of systems: No fevers or chills  Relevant historical information: Mild cognitive impairment   Exam:  BP 110/76   Pulse 90   Ht 5\' 6"  (1.676 m)   SpO2 97%   BMI 36.64 kg/m  General: Well Developed, well nourished, and in no acute distress.   MSK: Bilateral knees mild effusion normal motion.  Very antalgic gait.    Lab and Radiology Results   Zilretta injection bilateral knee Procedure: Real-time Ultrasound Guided Injection of right knee joint superior lateral patellar space Device: Philips Affiniti 50G Images permanently stored and available for review in PACS Verbal informed consent obtained.  Discussed risks and benefits of procedure. Warned about infection, hyperglycemia bleeding, damage to structures among others. Patient expresses understanding and agreement Time-out conducted.   Noted no overlying erythema, induration, or other signs of local infection.   Skin prepped in a sterile fashion.   Local anesthesia: Topical Ethyl chloride.   With sterile technique and under real time ultrasound guidance: Zilretta 32 mg injected into knee joint. Fluid seen entering the joint capsule.   Completed without difficulty   Advised to call if fevers/chills, erythema, induration, drainage, or persistent bleeding.   Images permanently stored and  available for review in the ultrasound unit.  Impression: Technically successful ultrasound guided injection.   Procedure: Real-time Ultrasound Guided Injection of left knee joint superior lateral patellar space Device: Philips Affiniti 50G Images permanently stored and available for review in PACS Verbal informed consent obtained.  Discussed risks and benefits of procedure. Warned about infection, hyperglycemia bleeding, damage to structures among others. Patient expresses understanding and agreement Time-out conducted.   Noted no overlying erythema, induration, or other signs of local infection.   Skin prepped in a sterile fashion.   Local anesthesia: Topical Ethyl chloride.   With sterile technique and under real time ultrasound guidance: Zilretta 32 mg injected into knee joint. Fluid seen entering the joint capsule.   Completed without difficulty   Advised to call if fevers/chills, erythema, induration, drainage, or persistent bleeding.   Images permanently stored and available for review in the ultrasound unit.  Impression: Technically successful ultrasound guided injection.  Lot number: [id number] for both injections      Assessment and Plan: 77 y.o. 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  Transitioning from gel injections to Zilretta injection which I think will work better for her.  She either had a reaction to the gel injections or it is just not can to be effective.  If Zilretta injection is not helpful consider referral to interventional radiology for geniculate artery embolization.   PDMP not reviewed this encounter. Orders Placed This Encounter  Procedures   [place] LIMITED JOINT SPACE STRUCTURES LOW BILAT(NO  LINKED CHARGES)    Order Specific Question:   Reason for Exam (SYMPTOM  OR DIAGNOSIS REQUIRED)    Answer:   bilateral knee pain    Order Specific Question:   Preferred imaging location?    Answer:   [facility] ordered  this encounter  Medications   Triamcinolone Acetonide (ZILRETTA) intra-articular injection 32 mg   Triamcinolone Acetonide (ZILRETTA) intra-articular injection 32 mg     Discussed warning signs or symptoms. Please see discharge instructions. Patient expresses understanding.   The above documentation has been reviewed and is accurate and complete [name], M.D.

## 2023-08-20 ENCOUNTER — Telehealth: Payer: Self-pay

## 2023-08-20 MED ORDER — DIPHENOXYLATE-ATROPINE 2.5-0.025 MG PO TABS: 1.0000 | ORAL_TABLET | Freq: Four times a day (QID) | ORAL | 0 refills | Status: AC | PRN

## 2023-08-20 NOTE — Telephone Encounter (Signed)
Called and let Pt know

## 2023-08-20 NOTE — Addendum Note (Signed)
Addended by: Corwin Levins on: 08/20/2023 01:03 PM   Modules accepted: Orders

## 2023-08-20 NOTE — Telephone Encounter (Signed)
Pt has stated she is having really bad diarrhea since taking the Ozempic shot on Sunday. Pt states she is needing some medication sent in to help her with the diarrhea.

## 2023-08-20 NOTE — Telephone Encounter (Signed)
Ok for lomotil prn, but would need OV for worsening pain, fever, or blood or other unusual symptoms.

## 2023-08-22 ENCOUNTER — Other Ambulatory Visit: Payer: Self-pay | Admitting: Internal Medicine

## 2023-08-22 ENCOUNTER — Other Ambulatory Visit: Payer: Self-pay

## 2023-08-22 ENCOUNTER — Ambulatory Visit: Payer: Medicare Other | Admitting: Family Medicine

## 2023-08-29 NOTE — Telephone Encounter (Signed)
Per telephone note 08/14/23:  "Alexandra Wolfe   [facility]   08/14/23 11:44 AM Note Patient called stating that since having the first gel injection, she has been having a lot of pain and she does not wish to continue the gel series and instead would like to do the "other injection".   Please advise.  She is currently scheduled for tomorrow.    "

## 2023-09-08 ENCOUNTER — Other Ambulatory Visit (HOSPITAL_COMMUNITY): Payer: Self-pay

## 2023-09-11 DIAGNOSIS — R350 Frequency of micturition: Secondary | ICD-10-CM | POA: Diagnosis not present

## 2023-09-18 DIAGNOSIS — R350 Frequency of micturition: Secondary | ICD-10-CM | POA: Diagnosis not present

## 2023-09-25 DIAGNOSIS — R350 Frequency of micturition: Secondary | ICD-10-CM | POA: Diagnosis not present

## 2023-09-29 ENCOUNTER — Telehealth: Payer: Self-pay | Admitting: Internal Medicine

## 2023-09-29 NOTE — Telephone Encounter (Signed)
Prescription Request  09/29/2023  LOV: 04/01/2023  What is the name of the medication or equipment? Ozempic - patient would like to go to the next high dosage  Have you contacted your pharmacy to request a refill? Yes   Which pharmacy would you like this sent to?  [id number] - [place], [place] - [address] [place] [address] Phone: [phone] Fax: [phone]    Patient notified that their request is being sent to the clinical staff for review and that they should receive a response within 2 business days.   Please advise at Mobile [phone] (mobile)

## 2023-09-30 MED ORDER — OZEMPIC (0.25 OR 0.5 MG/DOSE) 2 MG/3ML ~~LOC~~ SOPN: PEN_INJECTOR | SUBCUTANEOUS | 3 refills | Status: DC

## 2023-09-30 NOTE — Telephone Encounter (Signed)
Ok the ozempic is increased to 0.50 mg weekly - done erx

## 2023-10-07 DIAGNOSIS — R350 Frequency of micturition: Secondary | ICD-10-CM | POA: Diagnosis not present

## 2023-10-16 DIAGNOSIS — R350 Frequency of micturition: Secondary | ICD-10-CM | POA: Diagnosis not present

## 2023-10-24 DIAGNOSIS — R350 Frequency of micturition: Secondary | ICD-10-CM | POA: Diagnosis not present

## 2023-10-30 ENCOUNTER — Other Ambulatory Visit: Payer: Self-pay

## 2023-10-30 ENCOUNTER — Other Ambulatory Visit: Payer: Self-pay | Admitting: Internal Medicine

## 2023-10-30 DIAGNOSIS — E034 Atrophy of thyroid (acquired): Secondary | ICD-10-CM

## 2023-10-30 DIAGNOSIS — I10 Essential (primary) hypertension: Secondary | ICD-10-CM

## 2023-10-30 DIAGNOSIS — E1169 Type 2 diabetes mellitus with other specified complication: Secondary | ICD-10-CM

## 2023-10-30 DIAGNOSIS — F5101 Primary insomnia: Secondary | ICD-10-CM

## 2023-10-30 DIAGNOSIS — J309 Allergic rhinitis, unspecified: Secondary | ICD-10-CM

## 2023-10-31 DIAGNOSIS — R350 Frequency of micturition: Secondary | ICD-10-CM | POA: Diagnosis not present

## 2023-11-13 ENCOUNTER — Ambulatory Visit: Payer: Medicare Other | Admitting: Family Medicine

## 2023-11-14 DIAGNOSIS — N3946 Mixed incontinence: Secondary | ICD-10-CM | POA: Diagnosis not present

## 2023-11-14 DIAGNOSIS — R350 Frequency of micturition: Secondary | ICD-10-CM | POA: Diagnosis not present

## 2023-11-19 ENCOUNTER — Ambulatory Visit: Payer: Medicare Other | Admitting: Family Medicine

## 2023-11-21 ENCOUNTER — Ambulatory Visit: Payer: Medicare Other | Admitting: Family Medicine

## 2023-11-21 DIAGNOSIS — N3946 Mixed incontinence: Secondary | ICD-10-CM | POA: Diagnosis not present

## 2023-11-21 DIAGNOSIS — R350 Frequency of micturition: Secondary | ICD-10-CM | POA: Diagnosis not present

## 2023-12-01 ENCOUNTER — Telehealth: Payer: Self-pay | Admitting: Radiology

## 2023-12-01 NOTE — Telephone Encounter (Signed)
Copied from CRM [id number]. Topic: Clinical - Medication Refill >> Dec 01, 2023  3:22 PM Alexandra Wolfe wrote: Most Recent Primary Care Visit:  Provider: Cathleen Fears, GRACE P  Department: [facility]  Visit Type: MEDICARE AWV, SEQUENTIAL  Date: 06/25/2023  Medication: Pt states toke last shot of Ozempic on Sunday, and needs another rf. Pt would like an increase the rx. Pls advise.    Has the patient contacted their pharmacy?  (Agent: If no, request that the patient contact the pharmacy for the refill. If patient does not wish to contact the pharmacy document the reason why and proceed with request.) (Agent: If yes, when and what did the pharmacy advise?)  Is this the correct pharmacy for this prescription? Yes If no, delete pharmacy and type the correct one.  This is the patient's preferred pharmacy:  [id number] - [place], [place] - [address] [place] [address] Phone: [phone] Fax: [phone]   Has the prescription been filled recently?   Is the patient out of the medication? Yes  Has the patient been seen for an appointment in the last year OR does the patient have an upcoming appointment?   Can we respond through MyChart? No  Agent: Please be advised that Rx refills may take up to 3 business days. We ask that you follow-up with your pharmacy.

## 2023-12-02 MED ORDER — SEMAGLUTIDE (1 MG/DOSE) 4 MG/3ML ~~LOC~~ SOPN: 1.0000 mg | PEN_INJECTOR | SUBCUTANEOUS | 3 refills | Status: DC

## 2023-12-02 NOTE — Addendum Note (Signed)
Addended by: Corwin Levins on: 12/02/2023 12:01 PM   Modules accepted: Orders

## 2023-12-02 NOTE — Telephone Encounter (Signed)
Done erx 

## 2023-12-11 NOTE — Telephone Encounter (Signed)
 VOB initiated for 2025 coverage of Zilretta.

## 2023-12-15 NOTE — Telephone Encounter (Signed)
 [employer] - Prior Authorization NOT required  [employer] - Prior Authorization REQUIRED

## 2023-12-17 ENCOUNTER — Other Ambulatory Visit: Payer: Self-pay | Admitting: Internal Medicine

## 2023-12-17 ENCOUNTER — Other Ambulatory Visit: Payer: Self-pay

## 2023-12-17 DIAGNOSIS — J309 Allergic rhinitis, unspecified: Secondary | ICD-10-CM

## 2023-12-17 DIAGNOSIS — I10 Essential (primary) hypertension: Secondary | ICD-10-CM

## 2023-12-24 ENCOUNTER — Other Ambulatory Visit: Payer: Self-pay | Admitting: Internal Medicine

## 2023-12-24 DIAGNOSIS — E034 Atrophy of thyroid (acquired): Secondary | ICD-10-CM

## 2023-12-24 DIAGNOSIS — E1169 Type 2 diabetes mellitus with other specified complication: Secondary | ICD-10-CM

## 2023-12-25 ENCOUNTER — Other Ambulatory Visit: Payer: Self-pay

## 2023-12-30 ENCOUNTER — Telehealth: Payer: Self-pay | Admitting: Family Medicine

## 2023-12-30 NOTE — Telephone Encounter (Signed)
Scheduled

## 2023-12-30 NOTE — Telephone Encounter (Signed)
Patient called to set up an appointment with Dr [name] for knee injections.  She is scheduled for next Tuesday.  Please advise on what injection she would need and authorization.

## 2023-12-30 NOTE — Telephone Encounter (Signed)
ZILRETTA for BILAT knee OA   [employer] and [employer]  Primary Insurance: [employer] Medicare Co-pay: [phone] Co-insurance: 20% Deductible: does not apply Prior Auth: NOT required   Knee Injection History 12/04/22 - Kenalog BILAT 12/18/22 - Kenalog BILAT 03/12/23 - Kenalog BILAT 08/05/23 - Orthovisc #1 BILAT 08/15/23 - Zilretta BILAT

## 2023-12-30 NOTE — Telephone Encounter (Signed)
Prior Authorization is NOT required for Buy and Annette St[employer], OK to order now for upcoming appointment.

## 2024-01-02 ENCOUNTER — Telehealth: Payer: Self-pay

## 2024-01-02 NOTE — Telephone Encounter (Signed)
Pharmacy has been updated.

## 2024-01-02 NOTE — Telephone Encounter (Signed)
Copied from CRM [id number]. Topic: General - Other >> Jan 02, 2024  3:13 PM Sonny Dandy B wrote: Reason for CRM: pt called to advise she will be changing her pharmacy to [facility] on [address] in [place] point #[phone]

## 2024-01-06 ENCOUNTER — Ambulatory Visit: Payer: Medicare Other | Admitting: Family Medicine

## 2024-01-09 ENCOUNTER — Ambulatory Visit: Payer: Medicare Other | Admitting: Family Medicine

## 2024-01-09 ENCOUNTER — Other Ambulatory Visit: Payer: Self-pay

## 2024-01-09 ENCOUNTER — Encounter: Payer: Self-pay | Admitting: Family Medicine

## 2024-01-09 VITALS — BP 122/78 | HR 98 | Ht 66.0 in

## 2024-01-09 DIAGNOSIS — G8929 Other chronic pain: Secondary | ICD-10-CM

## 2024-01-09 DIAGNOSIS — M25561 Pain in right knee: Secondary | ICD-10-CM | POA: Diagnosis not present

## 2024-01-09 DIAGNOSIS — M170 Bilateral primary osteoarthritis of knee: Secondary | ICD-10-CM

## 2024-01-09 DIAGNOSIS — M25562 Pain in left knee: Secondary | ICD-10-CM | POA: Diagnosis not present

## 2024-01-09 MED ORDER — DICLOFENAC SODIUM 1 % EX GEL: 4.0000 g | Freq: Four times a day (QID) | CUTANEOUS | 1 refills | Status: AC

## 2024-01-09 MED ORDER — TRIAMCINOLONE ACETONIDE 32 MG IX SRER
32.0000 mg | Freq: Once | INTRA_ARTICULAR | Status: AC
  Administered 2024-01-09: 32 mg via INTRA_ARTICULAR

## 2024-01-09 NOTE — Patient Instructions (Addendum)
Thank you for coming in today.   Zilretta injections for both knees today, can consider repeat in 12 weeks 1 day.   You received an injection today. Seek immediate medical attention if the joint becomes red, extremely painful, or is oozing fluid.  The knee procedure is a Genicular Artery Embolization.   Prescriptions sent in for Topical Voltaren for both knees.   See [place] back as needed.

## 2024-01-09 NOTE — Progress Notes (Signed)
I, Stevenson Clinch, CMA acting as a scribe for [name], MD.  Alexandra Wolfe is a 78 y.o. female who presents to [facility] at [facility] today for cont'd bilat knee pain. Pt was last seen by Dr. [name] on 08/15/23 and was given bilat Zilretta injections.  Today, pt reports about 1-2 months of relief s/p Zilretta injections. [employer] for gel shots. Notes L>R knee pain. Having trouble ambulating d/t pain. The knee feels stiff. Swelling present bilaterally, L>R. Denies fall since last visit. Would like rx for topical diclofenac  Dx imaging: 12/04/22 R & L knee   12/23/21 R & L knee XR             03/02/20 R & L knee XR  Pertinent review of systems: No fevers or chills  Relevant historical information: Hypertension and diabetes. Neurocognitive defects following traumatic brain injury.  Exam:  BP 122/78   Pulse 98   Ht 5\' 6"  (1.676 m)   SpO2 98%   BMI 36.64 kg/m  General: Well Developed, well nourished, and in no acute distress.   MSK: Bilateral knees moderate effusion decreased motion.  Patient need assistance with standing.    Lab and Radiology Results   Zilretta injection bilateral knee Procedure: Real-time Ultrasound Guided Injection of right knee joint superior lateral patellar space Device: Philips Affiniti 50G Images permanently stored and available for review in PACS Verbal informed consent obtained.  Discussed risks and benefits of procedure. Warned about infection, hyperglycemia bleeding, damage to structures among others. Patient expresses understanding and agreement Time-out conducted.   Noted no overlying erythema, induration, or other signs of local infection.   Skin prepped in a sterile fashion.   Local anesthesia: Topical Ethyl chloride.   With sterile technique and under real time ultrasound guidance: Zilretta 32 mg injected into knee joint. Fluid seen entering the joint capsule.   Completed without difficulty   Advised to call if  fevers/chills, erythema, induration, drainage, or persistent bleeding.   Images permanently stored and available for review in the ultrasound unit.  Impression: Technically successful ultrasound guided injection.   Procedure: Real-time Ultrasound Guided Injection of left knee joint superior lateral patellar space Device: Philips Affiniti 50G Images permanently stored and available for review in PACS Verbal informed consent obtained.  Discussed risks and benefits of procedure. Warned about infection, hyperglycemia bleeding, damage to structures among others. Patient expresses understanding and agreement Time-out conducted.   Noted no overlying erythema, induration, or other signs of local infection.   Skin prepped in a sterile fashion.   Local anesthesia: Topical Ethyl chloride.   With sterile technique and under real time ultrasound guidance: Zilretta 32 mg injected into knee joint. Fluid seen entering the joint capsule.   Completed without difficulty   Advised to call if fevers/chills, erythema, induration, drainage, or persistent bleeding.   Images permanently stored and available for review in the ultrasound unit.  Impression: Technically successful ultrasound guided injection.  Lot number: [id number]      Assessment and Plan: 78 y.o. female with bilateral knee pain due to exacerbation of DJD.  She is a poor surgical candidate therefore we will continue Zilretta injections as long as they are working.  We did talk about genicular artery embolization as an option going forward if this stops working.  Continue to work on quad strength is much as possible.  Diclofenac gel prescribed.   PDMP not reviewed this encounter. Orders Placed This Encounter  Procedures   [place] LIMITED  JOINT SPACE STRUCTURES LOW BILAT(NO LINKED CHARGES)    Reason for Exam (SYMPTOM  OR DIAGNOSIS REQUIRED):   bilat knee pain    Preferred imaging location?:   [facility]   Meds ordered this  encounter  Medications   Triamcinolone Acetonide (ZILRETTA) intra-articular injection 32 mg   Triamcinolone Acetonide (ZILRETTA) intra-articular injection 32 mg   diclofenac Sodium (VOLTAREN) 1 % GEL    Sig: Apply 4 g topically 4 (four) times daily. Apply topically to affected area qid    Dispense:  112 g    Refill:  1     Discussed warning signs or symptoms. Please see discharge instructions. Patient expresses understanding.   The above documentation has been reviewed and is accurate and complete [name], M.D.

## 2024-01-12 NOTE — Telephone Encounter (Signed)
Pt received Zilretta injections for BILAT knee OA on 01/09/24. Can consider repeat injections on or after 04/03/24.

## 2024-02-05 ENCOUNTER — Telehealth: Payer: Self-pay | Admitting: Internal Medicine

## 2024-02-05 ENCOUNTER — Other Ambulatory Visit: Payer: Self-pay | Admitting: Internal Medicine

## 2024-02-05 DIAGNOSIS — J45909 Unspecified asthma, uncomplicated: Secondary | ICD-10-CM

## 2024-02-05 NOTE — Telephone Encounter (Unsigned)
 Copied from CRM [id number]. Topic: Clinical - Medication Refill >> Feb 05, 2024 10:44 AM Denese Killings wrote: Most Recent Primary Care Visit:  Provider: Cathleen Fears, GRACE P  Department: [facility]  Visit Type: MEDICARE AWV, SEQUENTIAL  Date: 06/25/2023  Medication: budesonide-formoterol (SYMBICORT) 160-4.5 MCG/ACT inhaler   Has the patient contacted their pharmacy? No (Agent: If no, request that the patient contact the pharmacy for the refill. If patient does not wish to contact the pharmacy document the reason why and proceed with request.) (Agent: If yes, when and what did the pharmacy advise?) pharmacy doesn't have prescription (new pharmacy)  Is this the correct pharmacy for this prescription? Yes If no, delete pharmacy and type the correct one.  This is the patient's preferred pharmacy:  [facility]  Has the prescription been filled recently? No  Is the patient out of the medication? No  Has the patient been seen for an appointment in the last year OR does the patient have an upcoming appointment? Yes  Can we respond through MyChart? No  Agent: Please be advised that Rx refills may take up to 3 business days. We ask that you follow-up with your pharmacy.

## 2024-02-05 NOTE — Telephone Encounter (Signed)
 Copied from CRM [id number]. Topic: Clinical - Prescription Issue >> Feb 05, 2024 12:11 PM Theodis Sato wrote: Reason for CRM: Barkley Bruns a pharmacist with [facility] is relaying this message for Dr. Jonny Ruiz, patient needs her metFORMIN (GLUCOPHAGE) 500 MG tablet sent to [facility] as that is her new pharmacy. Barkley Bruns also states patients trospium (SANCTURA) 20 MG tablet medication is to expensive and should be sent to [facility] to be filled as it is much cheaper.

## 2024-02-06 ENCOUNTER — Other Ambulatory Visit: Payer: Self-pay

## 2024-02-06 MED ORDER — BUDESONIDE-FORMOTEROL FUMARATE 160-4.5 MCG/ACT IN AERO: 2.0000 | INHALATION_SPRAY | Freq: Two times a day (BID) | RESPIRATORY_TRACT | 2 refills | Status: AC

## 2024-02-06 MED ORDER — METFORMIN HCL 500 MG PO TABS
500.0000 mg | ORAL_TABLET | Freq: Two times a day (BID) | ORAL | 2 refills | Status: AC
Start: 2024-02-06 — End: ?

## 2024-02-06 MED ORDER — TROSPIUM CHLORIDE 20 MG PO TABS: 20.0000 mg | ORAL_TABLET | Freq: Every day | ORAL | 0 refills | Status: AC

## 2024-02-06 NOTE — Telephone Encounter (Signed)
 Alexandra Wolfe has been sent to [facility].

## 2024-02-06 NOTE — Telephone Encounter (Signed)
 Metformin has been sent to [facility].

## 2024-02-19 ENCOUNTER — Emergency Department (HOSPITAL_COMMUNITY)

## 2024-02-19 ENCOUNTER — Encounter (HOSPITAL_COMMUNITY): Payer: Self-pay

## 2024-02-19 ENCOUNTER — Inpatient Hospital Stay (HOSPITAL_COMMUNITY)
Admission: EM | Admit: 2024-02-19 | Discharge: 2024-02-24 | DRG: 690 | Disposition: A | Discharge status: Skilled Nursing Facility | Attending: Internal Medicine | Admitting: Internal Medicine

## 2024-02-19 ENCOUNTER — Other Ambulatory Visit: Payer: Self-pay

## 2024-02-19 DIAGNOSIS — E785 Hyperlipidemia, unspecified: Secondary | ICD-10-CM | POA: Diagnosis not present

## 2024-02-19 DIAGNOSIS — W19XXXA Unspecified fall, initial encounter: Principal | ICD-10-CM | POA: Diagnosis present

## 2024-02-19 DIAGNOSIS — Z9049 Acquired absence of other specified parts of digestive tract: Secondary | ICD-10-CM

## 2024-02-19 DIAGNOSIS — Z7984 Long term (current) use of oral hypoglycemic drugs: Secondary | ICD-10-CM

## 2024-02-19 DIAGNOSIS — Z833 Family history of diabetes mellitus: Secondary | ICD-10-CM

## 2024-02-19 DIAGNOSIS — E034 Atrophy of thyroid (acquired): Secondary | ICD-10-CM

## 2024-02-19 DIAGNOSIS — M25562 Pain in left knee: Secondary | ICD-10-CM | POA: Diagnosis present

## 2024-02-19 DIAGNOSIS — Z7951 Long term (current) use of inhaled steroids: Secondary | ICD-10-CM

## 2024-02-19 DIAGNOSIS — N390 Urinary tract infection, site not specified: Secondary | ICD-10-CM | POA: Diagnosis not present

## 2024-02-19 DIAGNOSIS — Z79890 Hormone replacement therapy (postmenopausal): Secondary | ICD-10-CM

## 2024-02-19 DIAGNOSIS — J309 Allergic rhinitis, unspecified: Secondary | ICD-10-CM | POA: Diagnosis not present

## 2024-02-19 DIAGNOSIS — E119 Type 2 diabetes mellitus without complications: Secondary | ICD-10-CM

## 2024-02-19 DIAGNOSIS — M4804 Spinal stenosis, thoracic region: Secondary | ICD-10-CM | POA: Diagnosis not present

## 2024-02-19 DIAGNOSIS — Z6836 Body mass index (BMI) 36.0-36.9, adult: Secondary | ICD-10-CM

## 2024-02-19 DIAGNOSIS — F339 Major depressive disorder, recurrent, unspecified: Secondary | ICD-10-CM | POA: Diagnosis not present

## 2024-02-19 DIAGNOSIS — B961 Klebsiella pneumoniae [K. pneumoniae] as the cause of diseases classified elsewhere: Secondary | ICD-10-CM | POA: Diagnosis present

## 2024-02-19 DIAGNOSIS — B9620 Unspecified Escherichia coli [E. coli] as the cause of diseases classified elsewhere: Secondary | ICD-10-CM | POA: Diagnosis present

## 2024-02-19 DIAGNOSIS — Z7982 Long term (current) use of aspirin: Secondary | ICD-10-CM

## 2024-02-19 DIAGNOSIS — M4805 Spinal stenosis, thoracolumbar region: Secondary | ICD-10-CM | POA: Diagnosis not present

## 2024-02-19 DIAGNOSIS — G8929 Other chronic pain: Secondary | ICD-10-CM | POA: Diagnosis present

## 2024-02-19 DIAGNOSIS — T796XXA Traumatic ischemia of muscle, initial encounter: Secondary | ICD-10-CM | POA: Diagnosis not present

## 2024-02-19 DIAGNOSIS — Z87891 Personal history of nicotine dependence: Secondary | ICD-10-CM

## 2024-02-19 DIAGNOSIS — F32A Depression, unspecified: Secondary | ICD-10-CM | POA: Diagnosis present

## 2024-02-19 DIAGNOSIS — K219 Gastro-esophageal reflux disease without esophagitis: Secondary | ICD-10-CM | POA: Diagnosis present

## 2024-02-19 DIAGNOSIS — R41841 Cognitive communication deficit: Secondary | ICD-10-CM | POA: Diagnosis not present

## 2024-02-19 DIAGNOSIS — R29818 Other symptoms and signs involving the nervous system: Secondary | ICD-10-CM | POA: Diagnosis not present

## 2024-02-19 DIAGNOSIS — R296 Repeated falls: Secondary | ICD-10-CM | POA: Diagnosis present

## 2024-02-19 DIAGNOSIS — E1165 Type 2 diabetes mellitus with hyperglycemia: Secondary | ICD-10-CM | POA: Diagnosis present

## 2024-02-19 DIAGNOSIS — G4700 Insomnia, unspecified: Secondary | ICD-10-CM | POA: Diagnosis not present

## 2024-02-19 DIAGNOSIS — R270 Ataxia, unspecified: Secondary | ICD-10-CM | POA: Diagnosis not present

## 2024-02-19 DIAGNOSIS — Z8249 Family history of ischemic heart disease and other diseases of the circulatory system: Secondary | ICD-10-CM

## 2024-02-19 DIAGNOSIS — R531 Weakness: Secondary | ICD-10-CM | POA: Diagnosis not present

## 2024-02-19 DIAGNOSIS — R000 Tachycardia, unspecified: Secondary | ICD-10-CM | POA: Diagnosis not present

## 2024-02-19 DIAGNOSIS — R519 Headache, unspecified: Secondary | ICD-10-CM | POA: Diagnosis not present

## 2024-02-19 DIAGNOSIS — M5032 Other cervical disc degeneration, mid-cervical region, unspecified level: Secondary | ICD-10-CM | POA: Diagnosis not present

## 2024-02-19 DIAGNOSIS — R309 Painful micturition, unspecified: Secondary | ICD-10-CM | POA: Diagnosis present

## 2024-02-19 DIAGNOSIS — E569 Vitamin deficiency, unspecified: Secondary | ICD-10-CM | POA: Diagnosis not present

## 2024-02-19 DIAGNOSIS — G4733 Obstructive sleep apnea (adult) (pediatric): Secondary | ICD-10-CM | POA: Diagnosis not present

## 2024-02-19 DIAGNOSIS — Z7401 Bed confinement status: Secondary | ICD-10-CM | POA: Diagnosis not present

## 2024-02-19 DIAGNOSIS — E1140 Type 2 diabetes mellitus with diabetic neuropathy, unspecified: Secondary | ICD-10-CM | POA: Diagnosis not present

## 2024-02-19 DIAGNOSIS — Z87820 Personal history of traumatic brain injury: Secondary | ICD-10-CM

## 2024-02-19 DIAGNOSIS — J45998 Other asthma: Secondary | ICD-10-CM | POA: Diagnosis not present

## 2024-02-19 DIAGNOSIS — E8809 Other disorders of plasma-protein metabolism, not elsewhere classified: Secondary | ICD-10-CM | POA: Diagnosis not present

## 2024-02-19 DIAGNOSIS — R4189 Other symptoms and signs involving cognitive functions and awareness: Secondary | ICD-10-CM | POA: Diagnosis present

## 2024-02-19 DIAGNOSIS — A419 Sepsis, unspecified organism: Secondary | ICD-10-CM | POA: Diagnosis not present

## 2024-02-19 DIAGNOSIS — I10 Essential (primary) hypertension: Secondary | ICD-10-CM | POA: Diagnosis present

## 2024-02-19 DIAGNOSIS — K5900 Constipation, unspecified: Secondary | ICD-10-CM | POA: Diagnosis present

## 2024-02-19 DIAGNOSIS — M25561 Pain in right knee: Secondary | ICD-10-CM | POA: Diagnosis not present

## 2024-02-19 DIAGNOSIS — N3000 Acute cystitis without hematuria: Secondary | ICD-10-CM

## 2024-02-19 DIAGNOSIS — R197 Diarrhea, unspecified: Secondary | ICD-10-CM | POA: Diagnosis not present

## 2024-02-19 DIAGNOSIS — M5134 Other intervertebral disc degeneration, thoracic region: Secondary | ICD-10-CM | POA: Diagnosis present

## 2024-02-19 DIAGNOSIS — Z888 Allergy status to other drugs, medicaments and biological substances status: Secondary | ICD-10-CM

## 2024-02-19 DIAGNOSIS — M6281 Muscle weakness (generalized): Secondary | ICD-10-CM | POA: Diagnosis not present

## 2024-02-19 DIAGNOSIS — M1990 Unspecified osteoarthritis, unspecified site: Secondary | ICD-10-CM | POA: Diagnosis present

## 2024-02-19 DIAGNOSIS — Z800 Family history of malignant neoplasm of digestive organs: Secondary | ICD-10-CM

## 2024-02-19 DIAGNOSIS — S069XAA Unspecified intracranial injury with loss of consciousness status unknown, initial encounter: Secondary | ICD-10-CM | POA: Diagnosis present

## 2024-02-19 DIAGNOSIS — Z860100 Personal history of colon polyps, unspecified: Secondary | ICD-10-CM

## 2024-02-19 DIAGNOSIS — M51369 Other intervertebral disc degeneration, lumbar region without mention of lumbar back pain or lower extremity pain: Secondary | ICD-10-CM | POA: Diagnosis present

## 2024-02-19 DIAGNOSIS — I700 Atherosclerosis of aorta: Secondary | ICD-10-CM | POA: Diagnosis not present

## 2024-02-19 DIAGNOSIS — E039 Hypothyroidism, unspecified: Secondary | ICD-10-CM | POA: Diagnosis present

## 2024-02-19 DIAGNOSIS — Z791 Long term (current) use of non-steroidal anti-inflammatories (NSAID): Secondary | ICD-10-CM

## 2024-02-19 DIAGNOSIS — M542 Cervicalgia: Secondary | ICD-10-CM | POA: Diagnosis not present

## 2024-02-19 DIAGNOSIS — M25559 Pain in unspecified hip: Secondary | ICD-10-CM | POA: Diagnosis not present

## 2024-02-19 DIAGNOSIS — M5030 Other cervical disc degeneration, unspecified cervical region: Secondary | ICD-10-CM | POA: Diagnosis present

## 2024-02-19 DIAGNOSIS — J45909 Unspecified asthma, uncomplicated: Secondary | ICD-10-CM | POA: Diagnosis present

## 2024-02-19 DIAGNOSIS — R748 Abnormal levels of other serum enzymes: Secondary | ICD-10-CM | POA: Diagnosis present

## 2024-02-19 DIAGNOSIS — R32 Unspecified urinary incontinence: Secondary | ICD-10-CM | POA: Diagnosis present

## 2024-02-19 DIAGNOSIS — F419 Anxiety disorder, unspecified: Secondary | ICD-10-CM | POA: Diagnosis not present

## 2024-02-19 DIAGNOSIS — Z79899 Other long term (current) drug therapy: Secondary | ICD-10-CM

## 2024-02-19 DIAGNOSIS — N3281 Overactive bladder: Secondary | ICD-10-CM | POA: Diagnosis not present

## 2024-02-19 DIAGNOSIS — M129 Arthropathy, unspecified: Secondary | ICD-10-CM | POA: Diagnosis not present

## 2024-02-19 DIAGNOSIS — M48061 Spinal stenosis, lumbar region without neurogenic claudication: Secondary | ICD-10-CM | POA: Diagnosis present

## 2024-02-19 DIAGNOSIS — H9209 Otalgia, unspecified ear: Secondary | ICD-10-CM | POA: Diagnosis present

## 2024-02-19 DIAGNOSIS — R140 Abdominal distension (gaseous): Secondary | ICD-10-CM | POA: Diagnosis not present

## 2024-02-19 DIAGNOSIS — M6282 Rhabdomyolysis: Secondary | ICD-10-CM | POA: Diagnosis not present

## 2024-02-19 DIAGNOSIS — R0689 Other abnormalities of breathing: Secondary | ICD-10-CM | POA: Diagnosis not present

## 2024-02-19 DIAGNOSIS — Z043 Encounter for examination and observation following other accident: Secondary | ICD-10-CM | POA: Diagnosis not present

## 2024-02-19 DIAGNOSIS — M47812 Spondylosis without myelopathy or radiculopathy, cervical region: Secondary | ICD-10-CM | POA: Diagnosis not present

## 2024-02-19 DIAGNOSIS — S069XAS Unspecified intracranial injury with loss of consciousness status unknown, sequela: Secondary | ICD-10-CM | POA: Diagnosis not present

## 2024-02-19 DIAGNOSIS — R7401 Elevation of levels of liver transaminase levels: Secondary | ICD-10-CM | POA: Diagnosis present

## 2024-02-19 LAB — COMPREHENSIVE METABOLIC PANEL
ALT: 22 U/L (ref 0–44)
AST: 54 U/L — ABNORMAL HIGH (ref 15–41)
Albumin: 3.5 g/dL (ref 3.5–5.0)
Alkaline Phosphatase: 63 U/L (ref 38–126)
Anion gap: 14 (ref 5–15)
BUN: 20 mg/dL (ref 8–23)
CO2: 19 mmol/L — ABNORMAL LOW (ref 22–32)
Calcium: 9 mg/dL (ref 8.9–10.3)
Chloride: 102 mmol/L (ref 98–111)
Creatinine, Ser: 0.81 mg/dL (ref 0.44–1.00)
GFR, Estimated: >60 mL/min (ref >60)
Glucose, Bld: 148 mg/dL — ABNORMAL HIGH (ref 70–99)
Potassium: 4.2 mmol/L (ref 3.5–5.1)
Sodium: 135 mmol/L (ref 135–145)
Total Bilirubin: 1.5 mg/dL — ABNORMAL HIGH (ref 0.0–1.2)
Total Protein: 6.3 g/dL — ABNORMAL LOW (ref 6.5–8.1)

## 2024-02-19 LAB — URINALYSIS, W/ REFLEX TO CULTURE (INFECTION SUSPECTED)
Bilirubin Urine: NEGATIVE
Glucose, UA: NEGATIVE mg/dL
Ketones, ur: NEGATIVE mg/dL
Nitrite: POSITIVE — AB
Protein, ur: 100 mg/dL — AB
Specific Gravity, Urine: 1.015 (ref 1.005–1.030)
WBC, UA: >50 WBC/hpf (ref 0–5)
pH: 5 (ref 5.0–8.0)

## 2024-02-19 LAB — HEMOGLOBIN A1C
Hgb A1c MFr Bld: 6.4 % — ABNORMAL HIGH (ref 4.8–5.6)
Mean Plasma Glucose: 136.98 mg/dL

## 2024-02-19 LAB — CK: Total CK: 745 U/L — ABNORMAL HIGH (ref 38–234)

## 2024-02-19 LAB — CBC WITH DIFFERENTIAL/PLATELET
Abs Immature Granulocytes: 0.04 10*3/uL (ref 0.00–0.07)
Basophils Absolute: 0 10*3/uL (ref 0.0–0.1)
Basophils Relative: 0 %
Eosinophils Absolute: 0 10*3/uL (ref 0.0–0.5)
Eosinophils Relative: 0 %
HCT: 38.4 % (ref 36.0–46.0)
Hemoglobin: 12.5 g/dL (ref 12.0–15.0)
Immature Granulocytes: 0 %
Lymphocytes Relative: 16 %
Lymphs Abs: 2 10*3/uL (ref 0.7–4.0)
MCH: 28.4 pg (ref 26.0–34.0)
MCHC: 32.6 g/dL (ref 30.0–36.0)
MCV: 87.3 fL (ref 80.0–100.0)
Monocytes Absolute: 1 10*3/uL (ref 0.1–1.0)
Monocytes Relative: 8 %
Neutro Abs: 9.6 10*3/uL — ABNORMAL HIGH (ref 1.7–7.7)
Neutrophils Relative %: 76 %
Platelets: 311 10*3/uL (ref 150–400)
RBC: 4.4 MIL/uL (ref 3.87–5.11)
RDW: 13.8 % (ref 11.5–15.5)
WBC: 12.7 10*3/uL — ABNORMAL HIGH (ref 4.0–10.5)
nRBC: 0 % (ref 0.0–0.2)

## 2024-02-19 LAB — GLUCOSE, CAPILLARY: Glucose-Capillary: 158 mg/dL — ABNORMAL HIGH (ref 70–99)

## 2024-02-19 MED ORDER — MORPHINE SULFATE (PF) 2 MG/ML IV SOLN
2.0000 mg | Freq: Once | INTRAVENOUS | Status: AC
  Administered 2024-02-19: 2 mg via INTRAVENOUS
  Filled 2024-02-19: qty 1

## 2024-02-19 MED ORDER — ALBUTEROL SULFATE (2.5 MG/3ML) 0.083% IN NEBU: 2.5000 mg | INHALATION_SOLUTION | Freq: Four times a day (QID) | RESPIRATORY_TRACT | Status: DC | PRN

## 2024-02-19 MED ORDER — ONDANSETRON HCL 4 MG/2ML IJ SOLN
4.0000 mg | Freq: Once | INTRAMUSCULAR | Status: AC
  Administered 2024-02-19: 4 mg via INTRAVENOUS
  Filled 2024-02-19: qty 2

## 2024-02-19 MED ORDER — MOMETASONE FURO-FORMOTEROL FUM 200-5 MCG/ACT IN AERO
2.0000 | INHALATION_SPRAY | Freq: Two times a day (BID) | RESPIRATORY_TRACT | Status: DC
Start: 2024-02-19 — End: 2024-02-24
  Administered 2024-02-20 – 2024-02-24 (×8): 2 via RESPIRATORY_TRACT
  Filled 2024-02-19: qty 8.8

## 2024-02-19 MED ORDER — FESOTERODINE FUMARATE ER 4 MG PO TB24
4.0000 mg | ORAL_TABLET | Freq: Every day | ORAL | Status: DC
  Administered 2024-02-20 – 2024-02-24 (×5): 4 mg via ORAL
  Filled 2024-02-19 (×5): qty 1

## 2024-02-19 MED ORDER — ENOXAPARIN SODIUM 40 MG/0.4ML IJ SOSY
40.0000 mg | PREFILLED_SYRINGE | INTRAMUSCULAR | Status: DC
Start: 2024-02-20 — End: 2024-02-24
  Administered 2024-02-20 – 2024-02-24 (×5): 40 mg via SUBCUTANEOUS
  Filled 2024-02-19 (×5): qty 0.4

## 2024-02-19 MED ORDER — INSULIN ASPART 100 UNIT/ML IJ SOLN: 0.0000 [IU] | Freq: Every day | INTRAMUSCULAR | Status: DC

## 2024-02-19 MED ORDER — GABAPENTIN 100 MG PO CAPS
100.0000 mg | ORAL_CAPSULE | Freq: Three times a day (TID) | ORAL | Status: DC
Start: 2024-02-19 — End: 2024-02-24
  Administered 2024-02-20 – 2024-02-24 (×14): 100 mg via ORAL
  Filled 2024-02-19 (×14): qty 1

## 2024-02-19 MED ORDER — SODIUM CHLORIDE 0.9 % IV SOLN
1.0000 g | Freq: Once | INTRAVENOUS | Status: AC
  Administered 2024-02-19: 1 g via INTRAVENOUS
  Filled 2024-02-19: qty 10

## 2024-02-19 MED ORDER — ATORVASTATIN CALCIUM 80 MG PO TABS
80.0000 mg | ORAL_TABLET | Freq: Every day | ORAL | Status: DC
  Administered 2024-02-20: 80 mg via ORAL
  Filled 2024-02-19: qty 1

## 2024-02-19 MED ORDER — LEVOTHYROXINE SODIUM 50 MCG PO TABS
50.0000 ug | ORAL_TABLET | Freq: Every day | ORAL | Status: DC
  Administered 2024-02-20 – 2024-02-24 (×5): 50 ug via ORAL
  Filled 2024-02-19 (×5): qty 1

## 2024-02-19 MED ORDER — SENNOSIDES-DOCUSATE SODIUM 8.6-50 MG PO TABS
1.0000 | ORAL_TABLET | Freq: Every evening | ORAL | Status: DC | PRN
  Administered 2024-02-22 – 2024-02-23 (×2): 1 via ORAL
  Filled 2024-02-19 (×2): qty 1

## 2024-02-19 MED ORDER — ASPIRIN 81 MG PO TBEC
81.0000 mg | DELAYED_RELEASE_TABLET | Freq: Every day | ORAL | Status: DC
  Administered 2024-02-20 – 2024-02-24 (×5): 81 mg via ORAL
  Filled 2024-02-19 (×5): qty 1

## 2024-02-19 MED ORDER — INSULIN ASPART 100 UNIT/ML IJ SOLN
0.0000 [IU] | Freq: Three times a day (TID) | INTRAMUSCULAR | Status: DC
  Administered 2024-02-20 – 2024-02-23 (×8): 2 [IU] via SUBCUTANEOUS
  Administered 2024-02-24: 3 [IU] via SUBCUTANEOUS

## 2024-02-19 MED ORDER — LORATADINE 10 MG PO TABS
10.0000 mg | ORAL_TABLET | Freq: Every day | ORAL | Status: DC
  Administered 2024-02-20 – 2024-02-24 (×5): 10 mg via ORAL
  Filled 2024-02-19 (×5): qty 1

## 2024-02-19 MED ORDER — POLYETHYLENE GLYCOL 3350 17 G PO PACK
17.0000 g | PACK | Freq: Two times a day (BID) | ORAL | Status: DC | PRN
  Administered 2024-02-22: 17 g via ORAL
  Filled 2024-02-19: qty 1

## 2024-02-19 MED ORDER — FLUTICASONE PROPIONATE 50 MCG/ACT NA SUSP
1.0000 | Freq: Every day | NASAL | Status: DC
Start: 2024-02-20 — End: 2024-02-22
  Administered 2024-02-20 – 2024-02-21 (×2): 1 via NASAL
  Filled 2024-02-19: qty 16

## 2024-02-19 MED ORDER — PANTOPRAZOLE SODIUM 40 MG PO TBEC
40.0000 mg | DELAYED_RELEASE_TABLET | Freq: Every day | ORAL | Status: DC
  Administered 2024-02-20 – 2024-02-24 (×5): 40 mg via ORAL
  Filled 2024-02-19 (×5): qty 1

## 2024-02-19 MED ORDER — ADULT MULTIVITAMIN W/MINERALS CH
1.0000 | ORAL_TABLET | Freq: Every day | ORAL | Status: DC
  Administered 2024-02-20 – 2024-02-24 (×5): 1 via ORAL
  Filled 2024-02-19 (×6): qty 1

## 2024-02-19 MED ORDER — SODIUM CHLORIDE 0.9 % IV SOLN
2.0000 g | INTRAVENOUS | Status: DC
  Administered 2024-02-20 – 2024-02-21 (×2): 2 g via INTRAVENOUS
  Filled 2024-02-19 (×2): qty 20

## 2024-02-19 MED ORDER — ONDANSETRON HCL 4 MG PO TABS: 4.0000 mg | ORAL_TABLET | Freq: Four times a day (QID) | ORAL | Status: DC | PRN

## 2024-02-19 MED ORDER — ONDANSETRON HCL 4 MG/2ML IJ SOLN: 4.0000 mg | Freq: Four times a day (QID) | INTRAMUSCULAR | Status: DC | PRN

## 2024-02-19 MED ORDER — MECLIZINE HCL 25 MG PO TABS: 25.0000 mg | ORAL_TABLET | Freq: Three times a day (TID) | ORAL | Status: DC | PRN

## 2024-02-19 MED ORDER — LACTATED RINGERS IV BOLUS
1000.0000 mL | Freq: Once | INTRAVENOUS | Status: AC
  Administered 2024-02-19: 1000 mL via INTRAVENOUS

## 2024-02-19 MED ORDER — LACTATED RINGERS IV SOLN
150.0000 mL/h | INTRAVENOUS | Status: DC
  Administered 2024-02-20: 150 mL/h via INTRAVENOUS

## 2024-02-19 MED ORDER — DIPHENOXYLATE-ATROPINE 2.5-0.025 MG PO TABS: 1.0000 | ORAL_TABLET | Freq: Four times a day (QID) | ORAL | Status: DC | PRN

## 2024-02-19 MED ORDER — ACETAMINOPHEN 325 MG PO TABS
650.0000 mg | ORAL_TABLET | Freq: Four times a day (QID) | ORAL | Status: DC | PRN
  Administered 2024-02-21 – 2024-02-23 (×3): 650 mg via ORAL
  Filled 2024-02-19 (×4): qty 2

## 2024-02-19 MED ORDER — METOPROLOL SUCCINATE ER 25 MG PO TB24
25.0000 mg | ORAL_TABLET | Freq: Every day | ORAL | Status: DC
  Administered 2024-02-20 – 2024-02-24 (×5): 25 mg via ORAL
  Filled 2024-02-19 (×5): qty 1

## 2024-02-19 NOTE — ED Notes (Signed)
 Pt was put on the bedpan with attempts to urinate, pt did not put out anything aware she might have to be cathed

## 2024-02-19 NOTE — H&P (Signed)
 History and Physical    Patient: Alexandra Wolfe:[id number] DOB: 11/09/1946 DOA: 02/19/2024 DOS: the patient was seen and examined on 02/19/2024 PCP: Corwin Levins, MD  Patient coming from: Home  Chief Complaint:  Chief Complaint  Patient presents with   Fall   HPI: Alexandra Wolfe is a 78 y.o. female with medical history significant of hypothyroidism, non-insulin-dependent diabetes, essential hypertension, diabetic neuropathy, asthma, morbid obesity, history of TBI, GERD, chronic pain issues, anxiety with depression, seasonal allergies, urinary incontinence, Hyperlipidemia among other things who was brought in from home due to multiple falls.  Patient reported falling at least 3 times today alone.  She uses a rolling walker.  She lives alone with her health coming to help her out at least once a week.  She has felt this weakness gradually in the last 2 days and now she is following over with any little activity.  Patient was on the ground for more than 8 hours last night prior to coming in.  In the ER she was found to be weak debilitated.  She is afebrile but is tachycardic.  Hide evaluation shows leukocytosis, significant evidence of UTI.  No fractures or any bony damage on multiple scans.  She did have mildly elevated CPK at 745.  Patient meets sepsis criteria with a tachycardia and leukocytosis as well as evidence of UTI.  She has been admitted to the hospital for further evaluation.  Review of Systems: As mentioned in the history of present illness. All other systems reviewed and are negative. Past Medical History:  Diagnosis Date   ALLERGIC RHINITIS 04/02/2009   no per pt   Anxiety    BACK PAIN 09/27/2008   BUNIONS, BILATERAL 12/23/2007   CHEST DISCOMFORT, ATYPICAL 11/07/2009   Chronic LBP    COLONIC POLYPS, HX OF 09/13/2007   CONSTIPATION 09/27/2008   DEPRESSION 09/13/2007   Diabetes mellitus    diet controlled   DYSPNEA ON EXERTION 02/06/2010   Eustachian tube dysfunction  05/20/2011   FOOT PAIN, RIGHT 09/27/2008   HYPERLIPIDEMIA 03/11/2008   LBP (low back pain) 05/20/2011   Left otitis media 08/07/2022   Leukocytosis 11/20/2011   OSTEOARTHROSIS NOS, LOWER LEG 09/13/2007   SHOULDER PAIN, LEFT 02/14/2009   SVT (supraventricular tachycardia) (HCC)    SYMPTOM, PALPITATIONS 09/13/2007   Traumatic brain injury (HCC) 03/21/2021   URINARY INCONTINENCE 08/23/2009   Vertigo 05/20/2011   Past Surgical History:  Procedure Laterality Date   BUNIONECTOMY     right   ccx     CHOLECYSTECTOMY     LUMBAR LAMINECTOMY     LUMBAR LAMINECTOMY     SHOULDER ARTHROSCOPY  12/13/2011   Procedure: ARTHROSCOPY SHOULDER;  Surgeon: Verlee Rossetti;  Location: [facility] OR;  Service: Orthopedics;  Laterality: Left;  Left Shoulder ArthroscopyDebridement Limited Tenodesis Open Rotator Cuff Repair Spur Removal Right Shoulder Injection    Social History:  reports that she has quit smoking. Her smoking use included cigarettes. She has a 10 pack-year smoking history. She has never used smokeless tobacco. She reports that she does not drink alcohol and does not use drugs.  Allergies  Allergen Reactions   Aripiprazole     REACTION: agitation   Simvastatin     REACTION: myalgia    Family History  Problem Relation Age of Onset   Colon cancer Mother    Heart disease Father    Diabetes type II Father    Heart disease Brother    Diabetes Other  father   Esophageal cancer Neg Hx    Stomach cancer Neg Hx    Rectal cancer Neg Hx     Prior to Admission medications   Medication Sig Start Date End Date Taking? Authorizing Provider  40981 Inhale 2 puffs into the lungs 2 (two) times daily.    [provider]  19147 PLACE 1 DROP INTO AFFECTED EYE QID.    [provider]  acetaminophen (TYLENOL) 325 MG tablet Take 2 tablets (650 mg total) by mouth every 6 (six) hours as needed for mild pain or headache. 03/02/20   Joseph Art, DO  albuterol (VENTOLIN HFA) 108 (90  Base) MCG/ACT inhaler INHALE 2 PUFFS INTO THE LUNGS EVERY 6 HOURS AS NEEDED FOR WHEEZING OR SHORTNESS OF BREATH 09/19/20   Corwin Levins, MD  aspirin 81 MG EC tablet Take by mouth.    [provider]  atorvastatin (LIPITOR) 80 MG tablet TAKE 1 TABLET DAILY 12/25/23   Corwin Levins, MD  budesonide-formoterol [facility]) 160-4.5 MCG/ACT inhaler Inhale 2 puffs into the lungs 2 (two) times daily. 02/06/24   Corwin Levins, MD  Calcium Carbonate (CALCIUM 500 PO) Take 1,000 mg by mouth 2 (two) times daily.     [provider]  cetirizine (ZYRTEC) 10 MG tablet TAKE 1 TABLET DAILY 12/17/23   Corwin Levins, MD  citalopram (CELEXA) 40 MG tablet TAKE 1 TABLET BY MOUTH EVERY DAY 10/30/23   Corwin Levins, MD  diclofenac Sodium (VOLTAREN) 1 % GEL Apply 4 g topically 4 (four) times daily. Apply topically to affected area qid 01/09/24   Rodolph Bong, MD  diphenoxylate-atropine (LOMOTIL) 2.5-0.025 MG tablet Take 1 tablet by mouth 4 (four) times daily as needed for diarrhea or loose stools. 08/20/23   Corwin Levins, MD  ergocalciferol (VITAMIN D2) 1.25 MG (50000 UT) capsule Take 1 tablet by mouth daily.    [provider]  fluticasone (FLONASE) 50 MCG/ACT nasal spray SHAKE LIQUID AND USE 2 SPRAYS IN [facility] NOSTRIL DAILY 12/11/20   Corwin Levins, MD  gabapentin (NEURONTIN) 100 MG capsule TAKE 1 CAPSULE (100 MG TOTAL) BY MOUTH 3 TIMES A DAY 12/25/23   Corwin Levins, MD  glucose blood [facility] ULTRA) test strip Use as instructed three times daily E11.9 11/26/22   Corwin Levins, MD  glucose blood test strip OneTouch Verio test strips  USE AS DIRECTED THREE TIMES DAILY    [provider]  guaiFENesin (MUCINEX) 600 MG 12 hr tablet Take 2 tablets (1,200 mg total) by mouth 2 (two) times daily as needed. 08/21/22   Corwin Levins, MD  levETIRAcetam (KEPPRA) 500 MG tablet Take 1 tablet (500 mg total) by mouth 2 (two) times daily. 04/06/20   Corwin Levins, MD  levothyroxine (SYNTHROID) 50 MCG tablet TAKE  1 TABLET(50 MCG) BY MOUTH DAILY. NEEDS APPOINTMENT 12/25/23   Corwin Levins, MD  meclizine (ANTIVERT) 25 MG tablet Take 1 tablet (25 mg total) by mouth 3 (three) times daily as needed for dizziness. 06/10/22   Corwin Levins, MD  meloxicam (MOBIC) 15 MG tablet TAKE 1 TABLET(15 MG) BY MOUTH DAILY 12/23/21   Felecia Shelling, DPM  metFORMIN (GLUCOPHAGE) 500 MG tablet Take 1 tablet (500 mg total) by mouth 2 (two) times daily with a meal. 02/06/24   Corwin Levins, MD  metoprolol succinate (TOPROL-XL) 25 MG 24 hr tablet TAKE 1 TABLET BY MOUTH EVERY DAY 12/17/23   Corwin Levins, MD  Multiple Vitamin (MULTIVITAMIN WITH MINERALS) TABS tablet Take 1 tablet by mouth daily. 03/02/20   Joseph Art, DO  MYRBETRIQ 50 MG TB24 tablet Take 1 tablet (50 mg total) by mouth at bedtime. 03/30/20   Medina-Vargas, Monina C, NP  NON FORMULARY Heart healthy [facility] diet    [provider]  NP THYROID 30 MG tablet Take 1 tablet (30 mg total) by mouth daily. 03/30/20   Medina-Vargas, Monina C, NP  ondansetron (ZOFRAN) 4 MG tablet Take 1 tablet (4 mg total) by mouth every 8 (eight) hours as needed for nausea or vomiting. 07/06/20   Corwin Levins, MD  ondansetron (ZOFRAN-ODT) 4 MG disintegrating tablet Take 1 tablet (4 mg total) by mouth every 8 (eight) hours as needed for nausea or vomiting. 06/10/22   Corwin Levins, MD  oxybutynin (DITROPAN-XL) 10 MG 24 hr tablet Take by mouth.    [provider]  pantoprazole (PROTONIX) 40 MG tablet TAKE 1 TABLET BY MOUTH EVERY DAY 10/30/23   Corwin Levins, MD  polyethylene glycol powder (GLYCOLAX/MIRALAX) 17 GM/SCOOP powder Take 17 g by mouth 2 (two) times daily as needed. 12/14/21   Corwin Levins, MD  Semaglutide, 1 MG/DOSE, 4 MG/3ML SOPN Inject 1 mg as directed once a week. 12/02/23   Corwin Levins, MD  senna-docusate (SENOKOT-S) 8.6-50 MG tablet Take 1 tablet by mouth at bedtime as needed for mild constipation. 03/16/20   Kathlen Mody, MD  traZODone (DESYREL) 50 MG tablet TAKE 1 TABLET BY  MOUTH EVERYDAY AT BEDTIME 10/30/23   Corwin Levins, MD  triamcinolone (NASACORT) 55 MCG/ACT AERO nasal inhaler Place 2 sprays into the nose daily. 08/07/22   Corwin Levins, MD  trospium (SANCTURA) 20 MG tablet Take 1 tablet (20 mg total) by mouth at bedtime. 02/06/24   Corwin Levins, MD    Physical Exam: Vitals:   02/19/24 2001 02/19/24 2030 02/19/24 2100 02/19/24 2159  BP:  (!) 141/69 (!) 134/105 (!) 140/66  Pulse: (!) 102 (!) 106 (!) 103 (!) 101  Resp:  (!) 33 16 20  Temp:    98.4 F (36.9 C)  TempSrc:    Oral  SpO2: 90% 92% 93% 96%   Constitutional: Chronically ill looking, weak, NAD, calm, comfortable Eyes: PERRL, lids and conjunctivae normal ENMT: Mucous membranes are dry.  Posterior pharynx clear of any exudate or lesions.Normal dentition.  Neck: normal, supple, no masses, no thyromegaly Respiratory: clear to auscultation bilaterally, no wheezing, no crackles. Normal respiratory effort. No accessory muscle use.  Cardiovascular: Sinus tachycardia, no murmurs / rubs / gallops. No extremity edema. 2+ pedal pulses. No carotid bruits.  Abdomen: no tenderness, no masses palpated. No hepatosplenomegaly. Bowel sounds positive.  Musculoskeletal: Good range of motion, no joint swelling or tenderness, Skin: no rashes, lesions, ulcers. No induration Neurologic: CN 2-12 grossly intact. Sensation intact, DTR normal. Strength 5/5 in all 4.  Psychiatric: Mildly confused alert and oriented x 3. Normal mood  Data Reviewed:  Patient is afebrile, blood pressure 140/69, pulse 106 respiratory rate 33 oxygen sat 90% on room air urinalysis showed turbid urine nitrite positive large leukocytes WBC more than 50 many bacteria.  White count 12.7 AST 54 total protein 6.3 glucose 148 CO2 19 chest x-ray is negative x-ray of the pelvis negative CT angio chest negative CT cervical spine head lumbar and thoracic spine all showed no bony damage  Assessment and Plan:  #1 sepsis due to UTI: Patient will be admitted  to hospital  for management.  Urine cultures obtained.  Will add blood cultures.  Check lactic acid and follow-up.  IV Rocephin initiated.  Will await culture results and then transition to oral agent as needed.  #2 recurrent falls: Patient has had multiple falls and weakness.  This could be related to the UTI.  At this point we will get PT and OT consultation.  Patient may require higher level living condition like skilled facility in the  short-term.  #3 diabetes: Non-insulin-dependent.  She is on metformin.  Will hold it and do sliding scale insulin.  #4 hypothyroidism: Continue levothyroxine.  #5 history of asthma: No acute exacerbation.  Continue breathing treatments  #6 essential hypertension: Initial blood pressure medications are continued.  #7 depression with anxiety: Continue home regimen  #8 GERD: Continue with PPIs  #9 urinary incontinence: Continue home regimen of Marlan Palau  #10 history of TBI: Patient slightly confused.  #11 morbid obesity: Dietary counseling  #12 neurocognitive deficit: Patient still able to communicate.  She is alert.    Advance Care Planning:   Code Status: Full Code   Consults: None  Family Communication: No family at bedside  Severity of Illness: The appropriate patient status for this patient is INPATIENT. Inpatient status is judged to be reasonable and necessary in order to provide the required intensity of service to ensure the patient's safety. The patient's presenting symptoms, physical exam findings, and initial radiographic and laboratory data in the context of their chronic comorbidities is felt to place them at high risk for further clinical deterioration. Furthermore, it is not anticipated that the patient will be medically stable for discharge from the hospital within 2 midnights of admission.   * I certify that at the point of admission it is my clinical judgment that the patient will require inpatient hospital care spanning beyond 2  midnights from the point of admission due to high intensity of service, high risk for further deterioration and high frequency of surveillance required.*  AuthorLonia Blood, MD 02/19/2024 10:10 PM  For on call review [email or web address].

## 2024-02-19 NOTE — ED Notes (Signed)
 Got patient into a gown on the monitor did EKG shown to er provider patient is resting with call bell in reach got patient a warm blanket

## 2024-02-19 NOTE — Plan of Care (Signed)

## 2024-02-19 NOTE — ED Triage Notes (Signed)
 Pt BIB EMS for a fall sometime throughout the night, patient estimates she has been lying in the floor for approximately 8 hours.  Patient states she had 3 falls yesterday.  Patient complains of neck, shoulder, hip, and back pain.  C-collar in place, patient uses a rolling walker for assistance. Patient is AOX4.   HR 80 Cap 20 CBG 166 170/98 HR 80 Resp 32 97.7 20 gauge Left forearm

## 2024-02-19 NOTE — Care Management (Addendum)
 Met with patient at bedside she is A&Ox3. Patient reports living alone and son checks in on her. Patient has recently had several falls and most recent she said she laid on the floor for several hours.  Explained that her ED workup is still pending, and TOC team was consulted to also follow for work with patient and family to come up with a safe transitional care plan, she verbalized understanding and was appreciative.  TOC will continue to follow.

## 2024-02-19 NOTE — ED Provider Notes (Addendum)
 [place] [facility]ERGENCY DEPARTMENT AT [facility] Provider Note   CSN: [id number] Arrival date & time: 02/19/24  1028     History  Chief Complaint  Patient presents with   Alexandra Wolfe    SANAE Wolfe is a 78 y.o. female.  This is a 78 year old female who presents emergency department today due to multiple falls.  Patient reports having 3 falls over the last 24 hours.  Patient uses a rolling walker at home.  She lives alone, states that her son helps her out 1 time per week.  Patient is concerned that she needs additional assistance at home.  Patient is not on any blood thinners, describes losing her balance and falling over.  She reports that she was on the ground for 8 hours last night.   Fall       Home Medications Prior to Admission medications   Medication Sig Start Date End Date Taking? Authorizing Provider  [id number] Inhale 2 puffs into the lungs 2 (two) times daily.    [provider]  [id number] PLACE 1 DROP INTO AFFECTED EYE QID.    [provider]  acetaminophen (TYLENOL) 325 MG tablet Take 2 tablets (650 mg total) by mouth every 6 (six) hours as needed for mild pain or headache. 03/02/20   Ledarrius Beauchaine Art, DO  albuterol (VENTOLIN HFA) 108 (90 Base) MCG/ACT inhaler INHALE 2 PUFFS INTO THE LUNGS EVERY 6 HOURS AS NEEDED FOR WHEEZING OR SHORTNESS OF BREATH 09/19/20   Corwin Levins, MD  aspirin 81 MG EC tablet Take by mouth.    [provider]  atorvastatin (LIPITOR) 80 MG tablet TAKE 1 TABLET DAILY 12/25/23   Corwin Levins, MD  budesonide-formoterol [facility]) 160-4.5 MCG/ACT inhaler Inhale 2 puffs into the lungs 2 (two) times daily. 02/06/24   Corwin Levins, MD  Calcium Carbonate (CALCIUM 500 PO) Take 1,000 mg by mouth 2 (two) times daily.     [provider]  cetirizine (ZYRTEC) 10 MG tablet TAKE 1 TABLET DAILY 12/17/23   Corwin Levins, MD  citalopram (CELEXA) 40 MG tablet TAKE 1 TABLET BY MOUTH EVERY DAY 10/30/23   Corwin Levins, MD   diclofenac Sodium (VOLTAREN) 1 % GEL Apply 4 g topically 4 (four) times daily. Apply topically to affected area qid 01/09/24   Rodolph Bong, MD  diphenoxylate-atropine (LOMOTIL) 2.5-0.025 MG tablet Take 1 tablet by mouth 4 (four) times daily as needed for diarrhea or loose stools. 08/20/23   Corwin Levins, MD  ergocalciferol (VITAMIN D2) 1.25 MG (50000 UT) capsule Take 1 tablet by mouth daily.    [provider]  fluticasone (FLONASE) 50 MCG/ACT nasal spray SHAKE LIQUID AND USE 2 SPRAYS IN [facility] NOSTRIL DAILY 12/11/20   Corwin Levins, MD  gabapentin (NEURONTIN) 100 MG capsule TAKE 1 CAPSULE (100 MG TOTAL) BY MOUTH 3 TIMES A DAY 12/25/23   Corwin Levins, MD  glucose blood [facility] ULTRA) test strip Use as instructed three times daily E11.9 11/26/22   Corwin Levins, MD  glucose blood test strip OneTouch Verio test strips  USE AS DIRECTED THREE TIMES DAILY    [provider]  guaiFENesin (MUCINEX) 600 MG 12 hr tablet Take 2 tablets (1,200 mg total) by mouth 2 (two) times daily as needed. 08/21/22   Corwin Levins, MD  levETIRAcetam (KEPPRA) 500 MG tablet Take 1 tablet (500 mg total) by mouth 2 (two) times daily. 04/06/20   Corwin Levins, MD  levothyroxine (SYNTHROID) 50 MCG tablet TAKE 1 TABLET(50 MCG) BY MOUTH DAILY. NEEDS APPOINTMENT 12/25/23   Corwin Levins, MD  meclizine (ANTIVERT) 25 MG tablet Take 1 tablet (25 mg total) by mouth 3 (three) times daily as needed for dizziness. 06/10/22   Corwin Levins, MD  meloxicam (MOBIC) 15 MG tablet TAKE 1 TABLET(15 MG) BY MOUTH DAILY 12/23/21   Felecia Shelling, DPM  metFORMIN (GLUCOPHAGE) 500 MG tablet Take 1 tablet (500 mg total) by mouth 2 (two) times daily with a meal. 02/06/24   Corwin Levins, MD  metoprolol succinate (TOPROL-XL) 25 MG 24 hr tablet TAKE 1 TABLET BY MOUTH EVERY DAY 12/17/23   Corwin Levins, MD  Multiple Vitamin (MULTIVITAMIN WITH MINERALS) TABS tablet Take 1 tablet by mouth daily. 03/02/20   Elleanna Melling Art, DO  MYRBETRIQ 50 MG TB24  tablet Take 1 tablet (50 mg total) by mouth at bedtime. 03/30/20   Medina-Vargas, Monina C, NP  NON FORMULARY Heart healthy [facility] diet    [provider]  NP THYROID 30 MG tablet Take 1 tablet (30 mg total) by mouth daily. 03/30/20   Medina-Vargas, Monina C, NP  ondansetron (ZOFRAN) 4 MG tablet Take 1 tablet (4 mg total) by mouth every 8 (eight) hours as needed for nausea or vomiting. 07/06/20   Corwin Levins, MD  ondansetron (ZOFRAN-ODT) 4 MG disintegrating tablet Take 1 tablet (4 mg total) by mouth every 8 (eight) hours as needed for nausea or vomiting. 06/10/22   Corwin Levins, MD  oxybutynin (DITROPAN-XL) 10 MG 24 hr tablet Take by mouth.    [provider]  pantoprazole (PROTONIX) 40 MG tablet TAKE 1 TABLET BY MOUTH EVERY DAY 10/30/23   Corwin Levins, MD  polyethylene glycol powder (GLYCOLAX/MIRALAX) 17 GM/SCOOP powder Take 17 g by mouth 2 (two) times daily as needed. 12/14/21   Corwin Levins, MD  Semaglutide, 1 MG/DOSE, 4 MG/3ML SOPN Inject 1 mg as directed once a week. 12/02/23   Corwin Levins, MD  senna-docusate (SENOKOT-S) 8.6-50 MG tablet Take 1 tablet by mouth at bedtime as needed for mild constipation. 03/16/20   Kathlen Mody, MD  traZODone (DESYREL) 50 MG tablet TAKE 1 TABLET BY MOUTH EVERYDAY AT BEDTIME 10/30/23   Corwin Levins, MD  triamcinolone (NASACORT) 55 MCG/ACT AERO nasal inhaler Place 2 sprays into the nose daily. 08/07/22   Corwin Levins, MD  trospium (SANCTURA) 20 MG tablet Take 1 tablet (20 mg total) by mouth at bedtime. 02/06/24   Corwin Levins, MD      Allergies    Aripiprazole and Simvastatin    Review of Systems   Review of Systems  Physical Exam Updated Vital Signs BP (!) 154/64 (BP Location: Right Arm)   Pulse 92   Temp 97.9 F (36.6 C) (Oral)   Resp 15   SpO2 98%  Physical Exam Vitals reviewed.  HENT:     Head: Normocephalic and atraumatic.  Eyes:     Pupils: Pupils are equal, round, and reactive to light.  Cardiovascular:     Rate and Rhythm:  Normal rate.     Pulses: Normal pulses.  Pulmonary:     Effort: Pulmonary effort is normal. No respiratory distress.     Breath sounds: No wheezing.  Abdominal:     General: Abdomen is flat.  Musculoskeletal:        General: Normal range of motion.     Comments: No tenderness to palpation in  the bilateral shoulders, upper arms, elbows, forearms or wrists.  No tenderness to palpation in the chest.  Pelvis stable, nontender.  No tenderness, deformities noted on bilateral upper legs, knees, lower legs or ankles.  Patient able to lift both legs from the bed.  Lymphadenopathy:     Cervical: No cervical adenopathy.  Neurological:     General: No focal deficit present.     Mental Status: She is alert.     Cranial Nerves: No cranial nerve deficit.     Motor: No weakness.     ED Results / Procedures / Treatments   Labs (all labs ordered are listed, but only abnormal results are displayed) Labs Reviewed  COMPREHENSIVE METABOLIC PANEL - Abnormal; Notable for the following components:      Result Value   CO2 19 (*)    Glucose, Bld 148 (*)    Total Protein 6.3 (*)    AST 54 (*)    Total Bilirubin 1.5 (*)    All other components within normal limits  CBC WITH DIFFERENTIAL/PLATELET - Abnormal; Notable for the following components:   WBC 12.7 (*)    Neutro Abs 9.6 (*)    All other components within normal limits  CK - Abnormal; Notable for the following components:   Total CK 745 (*)    All other components within normal limits    EKG None  Radiology CT Lumbar Spine Wo Contrast Result Date: 02/19/2024 CLINICAL DATA:  78 year old female status post fall sometime in the night. Down for approximately 8 hours. Pain. Ataxia. EXAM: CT LUMBAR SPINE WITHOUT CONTRAST TECHNIQUE: Multidetector CT imaging of the lumbar spine was performed without intravenous contrast administration. Multiplanar CT image reconstructions were also generated. RADIATION DOSE REDUCTION: This exam was performed according  to the departmental dose-optimization program which includes automated exposure control, adjustment of the mA and/or kV according to patient size and/or use of iterative reconstruction technique. COMPARISON:  Thoracic spine CT today reported separately. FINDINGS: Segmentation: Normal, concordant with thoracic lumbar and today. Alignment: Straightening of lumbar lordosis with no significant spondylolisthesis or scoliosis. Vertebrae: Degenerative bone heterogeneity of bone mineralization throughout the visible spine. Mild motion artifact at the L3 and L4 levels. Maintained lumbar vertebral body height. Visible sacrum and SI joints appear intact. No acute osseous abnormality identified. Paraspinal and other soft tissues: Aortoiliac calcified atherosclerosis. Negative other visible noncontrast abdominal viscera. Lumbar paraspinal soft tissues remain within normal limits. Disc levels: Bulky lumbar spine degeneration. Multifactorial spinal stenosis at L1-L2 appears moderate to severe and asymmetric to the right (series 6, image 26). Moderate to severe L2-L3 multifactorial spinal stenosis with pronounced vacuum disc there. IMPRESSION: 1. No acute traumatic injury identified in the Lumbar Spine. 2. Widespread severe lumbar spine degeneration, with suspected Moderate Or Severe Multifactorial Spinal Stenosis at both L1-L2 and L2-L3. 3.  Aortic Atherosclerosis (ICD10-I70.0). Electronically Signed   By: Odessa Fleming M.D.   On: 02/19/2024 13:12   CT Thoracic Spine Wo Contrast Result Date: 02/19/2024 CLINICAL DATA:  78 year old female status post fall sometime in the night. Down for approximately 8 hours. Pain. Ataxia. EXAM: CT THORACIC SPINE WITHOUT CONTRAST TECHNIQUE: Multidetector CT images of the thoracic were obtained using the standard protocol without intravenous contrast. RADIATION DOSE REDUCTION: This exam was performed according to the departmental dose-optimization program which includes automated exposure control,  adjustment of the mA and/or kV according to patient size and/or use of iterative reconstruction technique. COMPARISON:  Cervical spine CT today. Prior chest CTA 11/10/2021. FINDINGS: Limited cervical  spine imaging:  Detailed separately today. Thoracic spine segmentation: Hypoplastic ribs at T12, otherwise normal. Alignment: Stable mild dextroconvex thoracic scoliosis since 2022. Subtle degenerative multilevel upper thoracic anterolisthesis not significantly changed. Subtle degenerative anterolisthesis of T8 on T9 has progressed since 2022. Vertebrae: Maintained thoracic vertebral body height, stable since 2022. Background bone mineralization within normal limits. Widespread degenerative sclerosis of thoracic vertebrae. No acute osseous abnormality identified. Visible posterior ribs appear intact. Paraspinal and other soft tissues: Calcified aortic atherosclerosis. Visible lung parenchyma not significantly changed since 2022, scattered mild bilateral atelectasis or scarring. No evidence of pericardial or pleural effusion. Calcified coronary artery plaque or stents. Negative visible noncontrast upper abdominal viscera. Thoracic paraspinal soft tissues are within normal limits. Disc levels: Chronic severe thoracic spine degeneration is widespread. Chronic severe disc and endplate degeneration especially in the lower thoracic spine with vacuum disc T8-T9 through T11-T12. Multifactorial degenerative lower thoracic spinal stenosis at both T11-T12 and T12-L1, at least moderate at the latter (series 4, image 148) which was not included on the previous CTA. Mild multifactorial spinal stenosis suspected at T9-T10 and appears increased. IMPRESSION: 1. No acute traumatic injury identified in the Thoracic Spine. 2. Chronic severe Thoracic Spine degeneration. Multilevel Lower Thoracic Spinal Stenosis appears progressed from a 2022 Chest CTA, and is at least moderate at T12-L1. 3.  Aortic Atherosclerosis (ICD10-I70.0).  Electronically Signed   By: Odessa Fleming M.D.   On: 02/19/2024 13:09   CT Cervical Spine Wo Contrast Result Date: 02/19/2024 CLINICAL DATA:  78 year old female status post fall sometime in the night. Down for approximately 8 hours. Pain. Ataxia. EXAM: CT CERVICAL SPINE WITHOUT CONTRAST TECHNIQUE: Multidetector CT imaging of the cervical spine was performed without intravenous contrast. Multiplanar CT image reconstructions were also generated. RADIATION DOSE REDUCTION: This exam was performed according to the departmental dose-optimization program which includes automated exposure control, adjustment of the mA and/or kV according to patient size and/or use of iterative reconstruction technique. COMPARISON:  Head CT today reported separately. Prior cervical spine CT 03/13/2020. FINDINGS: Alignment: Stable straightening of cervical lordosis. Stable cervicothoracic junction alignment. Maintained posterior element alignment. Skull base and vertebrae: Bone mineralization is within normal limits for age. Visualized skull base is intact. No atlanto-occipital dissociation. C1 and C2 appear intact and aligned. No acute osseous abnormality identified. Soft tissues and spinal canal: No prevertebral fluid or swelling. No visible canal hematoma. Calcified cervical carotid artery atherosclerosis. Negative visible noncontrast neck soft tissues. Disc levels: Advanced cervical spine degeneration, especially right side facet arthropathy and lower cervical disc and endplate degeneration. Evidence of developing degenerative ankylosis of C6-C7. No significant change by CT since 2021. Upper chest: Thoracic spine CT today reported separately. Negative lung apices. Calcified aortic atherosclerosis. IMPRESSION: 1. No acute traumatic injury identified in the cervical spine. 2. Advanced cervical spine degeneration, not significantly changed by CT since 2021. 3. Thoracic spine CT today reported separately. 4.  Aortic Atherosclerosis  (ICD10-I70.0). Electronically Signed   By: Odessa Fleming M.D.   On: 02/19/2024 13:03   DG Chest Portable 1 View Result Date: 02/19/2024 CLINICAL DATA:  78 year old female status post fall sometime in the night. Down for approximately 8 hours. Pain. Ataxia. EXAM: PORTABLE CHEST 1 VIEW COMPARISON:  Chest radiographs 12/23/2021. FINDINGS: Portable AP supine views at 1103 hours. Normal lung volumes and mediastinal contours. Visualized tracheal air column is within normal limits. Allowing for portable technique the lungs are clear. No evidence of pneumothorax or pleural effusion on the supine views. Paucity of bowel gas in the  upper abdomen. Calcified aortic atherosclerosis. No acute osseous abnormality identified. IMPRESSION: No acute cardiopulmonary abnormality or acute traumatic injury identified. Electronically Signed   By: Odessa Fleming M.D.   On: 02/19/2024 12:59   DG Pelvis 1-2 Views Result Date: 02/19/2024 CLINICAL DATA:  78 year old female status post fall sometime in the night. Down for approximately 8 hours. Pain. Ataxia. EXAM: PELVIS - 1-2 VIEW COMPARISON:  None Available. FINDINGS: Portable AP supine view at 1100 hours. Bone mineralization is within normal limits for age. Femoral heads normally located. Grossly intact proximal femurs. No pelvis fracture identified. SI joints appear symmetric. Nonobstructed bowel-gas pattern. IMPRESSION: No acute fracture or dislocation identified about the pelvis. Electronically Signed   By: Odessa Fleming M.D.   On: 02/19/2024 12:58   CT Head Wo Contrast Result Date: 02/19/2024 CLINICAL DATA:  78 year old female status post fall sometime in the night. Down for approximately 8 hours. Pain. Ataxia. EXAM: CT HEAD WITHOUT CONTRAST TECHNIQUE: Contiguous axial images were obtained from the base of the skull through the vertex without intravenous contrast. RADIATION DOSE REDUCTION: This exam was performed according to the departmental dose-optimization program which includes automated  exposure control, adjustment of the mA and/or kV according to patient size and/or use of iterative reconstruction technique. COMPARISON:  Brain MRI 05/09/2020.  Head CT 12/23/2021. FINDINGS: Brain: Stable cerebral volume. No midline shift, ventriculomegaly, mass effect, evidence of mass lesion, intracranial hemorrhage or evidence of cortically based acute infarction. Chronic basal ganglia vascular calcifications and heterogeneous hypodensity. Patchy mild to moderate for age periventricular white matter hypodensity. Stable gray-white matter differentiation throughout the brain. Vascular: Calcified atherosclerosis at the skull base. No suspicious intracranial vascular hyperdensity. Skull: Stable, intact.  No acute osseous abnormality identified. Sinuses/Orbits: Visualized paranasal sinuses and mastoids are stable and well aerated. Other: No acute orbit or scalp soft tissue injury identified. IMPRESSION: 1. No acute intracranial abnormality or acute traumatic injury identified. 2. Stable non contrast CT appearance of chronic small vessel disease. Electronically Signed   By: Odessa Fleming M.D.   On: 02/19/2024 12:57    Procedures Procedures    Medications Ordered in ED Medications  lactated ringers bolus 1,000 mL (has no administration in time range)  morphine (PF) 2 MG/ML injection 2 mg (2 mg Intravenous Given 02/19/24 1107)  ondansetron (ZOFRAN) injection 4 mg (4 mg Intravenous Given 02/19/24 1106)    ED Course/ Medical Decision Making/ A&P Clinical Course as of 02/19/24 1324  Thu Feb 19, 2024  1250 CK Total(!): 745 [JS]    Clinical Course User Index [JS] Arletha Pili, DO                                 Medical Decision Making 78 year old female here today for multiple falls, states that she needs extra assistance at home, states she is lonely.  Plan-I do not observe any obvious traumatic injuries to the patient.  Will obtain imaging, basic labs.  CK ordered given the patient reports she was on  the ground for 8 hours.  EMS did not report any signs of the patient soiling themselves, or the house being in disarray.  Patient tells me that she has not been able to drive for some time, lives alone at home, and is felt lonely.  I think that the patient is mostly hoping to get additional at home services added this visit to the emergency room.  Reassessment 1:20 PM-patient does have an elevated  CK7 80, no renal dysfunction.  Have begun to fluid resuscitate the patient.  My dependent review the patient's head CT shows no intracranial hemorrhage.  I reviewed the patient's radiology imaging, no acute process.  With the patient's elevated CK, believe she does have a medical necessity requiring admission for IV fluids and monitoring.  Will discuss with hospitalist.  Reassessment 1:40 PM-spoke with hospitalist, Dr. Alinda Money.  He thinks patient is appropriate for boarder status in the ED, and I agree.  Will recheck patient's CK in the morning.   Reassessment 145-patient's son has arrived.  He states the patient had been endorsing increased burning with urination, that they had an appointment scheduled with PCP for this.  He is concerned about a UTI.  I have added on a urinalysis.  Unfortunately we have had some delays in getting the urinalysis.  Patient will be signed out to Dr. Fredderick Phenix pending UA and disposition.  Amount and/or Complexity of Data Reviewed Labs: ordered. Decision-making details documented in ED Course. Radiology: ordered.  Risk Prescription drug management. Decision regarding hospitalization.           Final Clinical Impression(s) / ED Diagnoses Final diagnoses:  Fall, initial encounter  Traumatic rhabdomyolysis, initial encounter [facility])    Rx / DC Orders ED Discharge Orders     None             Arletha Pili, DO 02/19/24 1453

## 2024-02-19 NOTE — ED Provider Notes (Signed)
 Care taken over from Dr. Andria Meuse.  Patient was awaiting urinalysis.  Urine does appear infected.  She was started on IV Rocephin.  Discussed with the hospitalist who will admit the patient for further treatment.   Rolan Bucco, MD 02/19/24 2043

## 2024-02-19 NOTE — ED Notes (Signed)
 Patients son, Orvilla Fus, said to call him with any updates at [phone].

## 2024-02-19 NOTE — TOC Progression Note (Signed)
 Transition of Care [facility]) - Progression Note    Patient Details  Name: Alexandra Wolfe MRN: [id number] Date of Birth: July 06, 1946  Transition of Care [facility]) CM/SW Contact  Carmina Miller, [place] Phone Number: 02/19/2024, 7:46 PM  Clinical Narrative:     CSW spoke with pt's son [name] via phone, he states he recently left the hospital for the evening and plans on returning in the morning. CSW inquired on questions about SNF, Orvilla Fus states he believes his mom would benefit from SNF, states she has had numerous falls and doesn't feel like she is safe at this time. CSW asked Orvilla Fus what he felt pt would want, he states he believes pt would be agreeable to STR, advised we would have a conversation with her as well. CSW explained STR process including insurance auth process. [name] expressed that he believes his mother may need LTC in the near future, CSW advised he could work with SNF's to start that process, pt does have Medicaid, doesn't own property and has very little funds in the bank. TOC will continue to follow and await PT recs.         Expected Discharge Plan and Services                                               Social Determinants of Health (SDOH) Interventions SDOH Screenings   Food Insecurity: No Food Insecurity (06/25/2023)  Housing: Low Risk  (06/25/2023)  Transportation Needs: No Transportation Needs (03/26/2022)  Utilities: Not At Risk (06/25/2023)  Alcohol Screen: Low Risk  (06/25/2023)  Depression (PHQ2-9): Low Risk  (06/25/2023)  Recent Concern: Depression (PHQ2-9) - Medium Risk (04/01/2023)  Financial Resource Strain: Low Risk  (06/25/2023)  Physical Activity: Inactive (06/25/2023)  Social Connections: Socially Isolated (06/25/2023)  Stress: No Stress Concern Present (06/25/2023)  Tobacco Use: Medium Risk (02/19/2024)  Health Literacy: Adequate Health Literacy (06/25/2023)    Readmission Risk Interventions     No data to display

## 2024-02-19 NOTE — Discharge Planning (Signed)
 RNCM met with pt and son, Alexandra Wolfe at bedside regarding discharge plan.  Pt lives at home alone in apartment.  She has wheelchair, walker, shower chair and lift chair.  Son helps as much as he can but can not be there at all times and mom does not always utilize DME and ends up falling.  Pt and [name] would like to explore SNF (if she qualifies) and home health as a back up.  RNCM placed PT evaluation order and TOC will continue to follow.

## 2024-02-20 DIAGNOSIS — J45909 Unspecified asthma, uncomplicated: Secondary | ICD-10-CM | POA: Diagnosis not present

## 2024-02-20 DIAGNOSIS — R32 Unspecified urinary incontinence: Secondary | ICD-10-CM

## 2024-02-20 DIAGNOSIS — I10 Essential (primary) hypertension: Secondary | ICD-10-CM

## 2024-02-20 DIAGNOSIS — K219 Gastro-esophageal reflux disease without esophagitis: Secondary | ICD-10-CM

## 2024-02-20 DIAGNOSIS — M25561 Pain in right knee: Secondary | ICD-10-CM

## 2024-02-20 DIAGNOSIS — R29818 Other symptoms and signs involving the nervous system: Secondary | ICD-10-CM | POA: Diagnosis not present

## 2024-02-20 DIAGNOSIS — R4189 Other symptoms and signs involving cognitive functions and awareness: Secondary | ICD-10-CM

## 2024-02-20 DIAGNOSIS — F32A Depression, unspecified: Secondary | ICD-10-CM

## 2024-02-20 DIAGNOSIS — G8929 Other chronic pain: Secondary | ICD-10-CM

## 2024-02-20 DIAGNOSIS — F419 Anxiety disorder, unspecified: Secondary | ICD-10-CM

## 2024-02-20 DIAGNOSIS — N3000 Acute cystitis without hematuria: Secondary | ICD-10-CM | POA: Diagnosis not present

## 2024-02-20 DIAGNOSIS — M25562 Pain in left knee: Secondary | ICD-10-CM

## 2024-02-20 LAB — COMPREHENSIVE METABOLIC PANEL
ALT: 25 U/L (ref 0–44)
AST: 36 U/L (ref 15–41)
Albumin: 2.9 g/dL — ABNORMAL LOW (ref 3.5–5.0)
Alkaline Phosphatase: 54 U/L (ref 38–126)
Anion gap: 10 (ref 5–15)
BUN: 12 mg/dL (ref 8–23)
CO2: 22 mmol/L (ref 22–32)
Calcium: 8.3 mg/dL — ABNORMAL LOW (ref 8.9–10.3)
Chloride: 101 mmol/L (ref 98–111)
Creatinine, Ser: 0.77 mg/dL (ref 0.44–1.00)
GFR, Estimated: >60 mL/min (ref >60)
Glucose, Bld: 147 mg/dL — ABNORMAL HIGH (ref 70–99)
Potassium: 3.9 mmol/L (ref 3.5–5.1)
Sodium: 133 mmol/L — ABNORMAL LOW (ref 135–145)
Total Bilirubin: 0.9 mg/dL (ref 0.0–1.2)
Total Protein: 5.6 g/dL — ABNORMAL LOW (ref 6.5–8.1)

## 2024-02-20 LAB — CBC
HCT: 40.9 % (ref 36.0–46.0)
HCT: 35.4 % — ABNORMAL LOW (ref 36.0–46.0)
Hemoglobin: 13.3 g/dL (ref 12.0–15.0)
Hemoglobin: 11.5 g/dL — ABNORMAL LOW (ref 12.0–15.0)
MCH: 28.8 pg (ref 26.0–34.0)
MCH: 28.5 pg (ref 26.0–34.0)
MCHC: 32.5 g/dL (ref 30.0–36.0)
MCHC: 32.5 g/dL (ref 30.0–36.0)
MCV: 88.5 fL (ref 80.0–100.0)
MCV: 87.8 fL (ref 80.0–100.0)
Platelets: 332 10*3/uL (ref 150–400)
Platelets: 265 10*3/uL (ref 150–400)
RBC: 4.62 MIL/uL (ref 3.87–5.11)
RBC: 4.03 MIL/uL (ref 3.87–5.11)
RDW: 14.1 % (ref 11.5–15.5)
RDW: 14.1 % (ref 11.5–15.5)
WBC: 12.1 10*3/uL — ABNORMAL HIGH (ref 4.0–10.5)
WBC: 9.1 10*3/uL (ref 4.0–10.5)
nRBC: 0 % (ref 0.0–0.2)
nRBC: 0 % (ref 0.0–0.2)

## 2024-02-20 LAB — PROTIME-INR
INR: 1.2 (ref 0.8–1.2)
Prothrombin Time: 15.2 s (ref 11.4–15.2)

## 2024-02-20 LAB — CORTISOL-AM, BLOOD: Cortisol - AM: 14.4 ug/dL (ref 6.7–22.6)

## 2024-02-20 LAB — GLUCOSE, CAPILLARY
Glucose-Capillary: 148 mg/dL — ABNORMAL HIGH (ref 70–99)
Glucose-Capillary: 132 mg/dL — ABNORMAL HIGH (ref 70–99)
Glucose-Capillary: 145 mg/dL — ABNORMAL HIGH (ref 70–99)
Glucose-Capillary: 131 mg/dL — ABNORMAL HIGH (ref 70–99)

## 2024-02-20 LAB — CK: Total CK: 444 U/L — ABNORMAL HIGH (ref 38–234)

## 2024-02-20 LAB — CREATININE, SERUM
Creatinine, Ser: 0.82 mg/dL (ref 0.44–1.00)
GFR, Estimated: >60 mL/min (ref >60)

## 2024-02-20 MED ORDER — ACT DRY MOUTH MT LOZG
1.0000 | LOZENGE | OROMUCOSAL | Status: DC | PRN
  Filled 2024-02-20 (×2): qty 26

## 2024-02-20 MED ORDER — LACTATED RINGERS IV SOLN: INTRAVENOUS | Status: DC

## 2024-02-20 NOTE — Evaluation (Signed)
 Physical Therapy Evaluation Patient Details Name: Alexandra Wolfe MRN: [id number] DOB: 06/13/1946 Today's Date: 02/20/2024  History of Present Illness  78 y.o. female admitted 3/13 with falls and UTI. PMhx: hypothyroidism, DM, HTN, diabetic neuropathy, asthma, morbid obesity, TBI, GERD, chronic pain issues, anxiety with depression, HLD, urinary incontinence, vertigo  Clinical Impression  Pt pleasant and willing to mobilize. Pt reports generalized aching and weakness with difficulty moving legs, impaired balance and transfers, and decreased functional mobility who will benefit from acute therapy to maximize mobility, safety and function. Patient will benefit from continued inpatient follow up therapy, <3 hours/day       If plan is discharge home, recommend the following: A lot of help with walking and/or transfers;A lot of help with bathing/dressing/bathroom;Assistance with cooking/housework;Assist for transportation   Can travel by private vehicle   No    Equipment Recommendations None recommended by PT  Recommendations for Other Services  OT consult    Functional Status Assessment Patient has had a recent decline in their functional status and demonstrates the ability to make significant improvements in function in a reasonable and predictable amount of time.     Precautions / Restrictions Precautions Precautions: Fall Recall of Precautions/Restrictions: Impaired      Mobility  Bed Mobility Overal bed mobility: Needs Assistance Bed Mobility: Supine to Sit     Supine to sit: Mod assist, HOB elevated, Used rails     General bed mobility comments: Mod +2 assist to pivot to right side of bed with assist to clear legs and elevate trunk from surface, increased time    Transfers Overall transfer level: Needs assistance   Transfers: Sit to/from Stand Sit to Stand: Min assist, +2 physical assistance           General transfer comment: cues for hand placement and assist  to stabilize RW as pt pulling up on RW with cues for pushing off surface, anterior translation and assist to power up    Ambulation/Gait Ambulation/Gait assistance: Min assist, +2 safety/equipment Gait Distance (Feet): 6 Feet Assistive device: Rolling walker (2 wheels) Gait Pattern/deviations: Shuffle, Trunk flexed   Gait velocity interpretation: <1.31 ft/sec, indicative of household ambulator   General Gait Details: pt with min assist to control RW and balance with stepping, +2 close chair follow, limited by fatigue and weakness  Stairs            Wheelchair Mobility     Tilt Bed    Modified Rankin (Stroke Patients Only)       Balance Overall balance assessment: Needs assistance   Sitting balance-Leahy Scale: Fair     Standing balance support: Bilateral upper extremity supported, Reliant on assistive device for balance, During functional activity Standing balance-Leahy Scale: Poor Standing balance comment: RW and physical assist in standing                             Pertinent Vitals/Pain Pain Assessment Pain Assessment: 0-10 Pain Score: 4  Pain Location: right hip, left knee Pain Descriptors / Indicators: Aching Pain Intervention(s): Limited activity within patient's tolerance, Monitored during session, Repositioned    Home Living Family/patient expects to be discharged to:: Private residence Living Arrangements: Alone Available Help at Discharge: Family;Available PRN/intermittently Type of Home: Apartment Home Access: Level entry       Home Layout: One level Home Equipment: Patent examiner (4 wheels);Wheelchair - manual;Shower seat;BSC/3in1;Lift chair Additional Comments: sleeps in lift chair, several falls at home  Prior Function Prior Level of Function : Independent/Modified Independent             Mobility Comments: pt uses lift chair to stand and reports moving on her own in home but with falls ADLs Comments: son drives  and assist with groceries     Extremity/Trunk Assessment   Upper Extremity Assessment Upper Extremity Assessment: Generalized weakness    Lower Extremity Assessment Lower Extremity Assessment: Generalized weakness (grossly 40 degrees active left knee flexion, very limited hip abduction bil)    Cervical / Trunk Assessment Cervical / Trunk Assessment: Normal  Communication   Communication Communication: No apparent difficulties    Cognition Arousal: Alert Behavior During Therapy: WFL for tasks assessed/performed   PT - Cognitive impairments: Safety/Judgement, Problem solving                       PT - Cognition Comments: posterior bias with max cues to push down on RW for mobility, sequential cues for transfers         Cueing Cueing Techniques: Gestural cues, Tactile cues, Verbal cues     General Comments      Exercises     Assessment/Plan    PT Assessment Patient needs continued PT services  PT Problem List Decreased strength;Decreased range of motion;Decreased activity tolerance;Decreased balance;Decreased mobility;Decreased safety awareness;Decreased knowledge of use of DME;Decreased cognition;Decreased coordination       PT Treatment Interventions Gait training;DME instruction;Balance training;Functional mobility training;Therapeutic activities;Therapeutic exercise;Patient/family education;Cognitive remediation;Neuromuscular re-education    PT Goals (Current goals can be found in the Care Plan section)  Acute Rehab PT Goals Patient Stated Goal: walk without falling PT Goal Formulation: With patient Time For Goal Achievement: 03/05/24 Potential to Achieve Goals: Fair    Frequency Min 2X/week     Co-evaluation               AM-PAC PT "6 Clicks" Mobility  Outcome Measure Help needed turning from your back to your side while in a flat bed without using bedrails?: A Lot Help needed moving from lying on your back to sitting on the side of a  flat bed without using bedrails?: A Lot Help needed moving to and from a bed to a chair (including a wheelchair)?: A Lot Help needed standing up from a chair using your arms (e.g., wheelchair or bedside chair)?: A Lot Help needed to walk in hospital room?: Total Help needed climbing 3-5 steps with a railing? : Total 6 Click Score: 10    End of Session Equipment Utilized During Treatment: Gait belt Activity Tolerance: Patient tolerated treatment well Patient left: in chair;with call bell/phone within reach;with chair alarm set Nurse Communication: Mobility status PT Visit Diagnosis: Other abnormalities of gait and mobility (R26.89);Difficulty in walking, not elsewhere classified (R26.2);History of falling (Z91.81);Muscle weakness (generalized) (M62.81)    Time: 1610-9604 PT Time Calculation (min) (ACUTE ONLY): 19 min   Charges:   PT Evaluation $PT Eval Moderate Complexity: 1 Mod   PT General Charges $$ ACUTE PT VISIT: 1 Visit         Merryl Hacker, PT Acute Rehabilitation Services Office: [phone]   Micheala Morissette B Gurfateh Mcclain 02/20/2024, 10:03 AM

## 2024-02-20 NOTE — Progress Notes (Signed)
 PROGRESS NOTE  AVERIANNA BRUGGER ZOX:[id number] DOB: [birth year]   PCP: Corwin Levins, MD  Patient is from: Home.  Lives alone.  Uses rolling walker at baseline.  DOA: 02/19/2024 LOS: 1  Chief complaints Chief Complaint  Patient presents with   Fall     Brief Narrative / Interim history: 78 year old F with PMH of DM-2 with neuropathy, morbid obesity, asthma, urinary incontinence, SDH/TBI/neurocognitive deficit, osteoarthritis, lumbar laminectomy, hypothyroidism, anxiety, depression and GERD presenting with recurrent falls, dysuria and suprapubic pain, and admitted with working diagnosis of UTI, generalized weakness and recurrent falls.  Patient reports dysuria for about a week and suprapubic tenderness and pain for over 2 weeks.  Chronic urinary incontinence.   In ED, stable vitals.  Afebrile.  CMP with mild hyperglycemia and mildly elevated AST.  T. bili 1.5.  WBC 12.7 with left shift.  CK5 147.  CXR and pelvic x-ray without acute finding.  CT head without acute finding.  Cervical, thoracic and lumbar spine CT with advanced degenerative changes, progressive multilevel lower thoracic spine and lumbar stenosis, at least moderate at T12-L1, moderate to severe at L1-L2 and L2-L3.  UA consistent with UTI.  Cultures obtained.  Started on IV fluid and IV ceftriaxone.   Blood cultures NGTD.  Urine culture pending.  Subjective: Seen and examined earlier this morning.  No major events overnight of this morning.  Continues to endorse dysuria and suprapubic tenderness.  She has chronic urinary incontinence.  Denies fever, chills or GI symptoms.  She has bilateral feet neuropathy unchanged from baseline.  Also reports bilateral leg weakness but has not noticed acute change.  No history of bowel incontinence or accidents.  Last bowel movement about 4 days ago.  Reports using Mylanta for bowel.  Objective: Vitals:   02/19/24 2159 02/20/24 0100 02/20/24 0605 02/20/24 0750  BP: (!) 140/66 (!) 134/53 (!)  140/52 (!) 142/63  Pulse: (!) 101 92 92 92  Resp: 20 18 16 20   Temp: 98.4 F (36.9 C) 98.8 F (37.1 C) 99.6 F (37.6 C) 98.3 F (36.8 C)  TempSrc: Oral Oral Oral Oral  SpO2: 96%  93% 94%    Examination:  GENERAL: No apparent distress.  Nontoxic. HEENT: MMM.  Vision and hearing grossly intact.  NECK: Supple.  No apparent JVD.  RESP:  No IWOB.  Fair aeration bilaterally. CVS:  RRR. Heart sounds normal.  ABD/GI/GU: BS+. Abd soft, NTND.  MSK/EXT:  Moves extremities. No apparent deformity. No edema.  SKIN: no apparent skin lesion or wound NEURO: Awake, alert and oriented appropriately.  No apparent focal neuro deficit other than significant symmetric BLE weakness and paresthesia.  Moderate about 2+/5 with bilateral knee extension and hip flexion. PSYCH: Calm. Normal affect.   Procedures:  None  Microbiology summarized: Blood cultures NGTD Urine culture pending  Assessment and plan: Sepsis due to urinary tract infection: Present on admission.  Heart tachycardia, tachypnea and leukocytosis.  Has dysuria with suprapubic pain.  Blood cultures NGTD.  Urine culture pending. -Continue IV ceftriaxone pending urine cultures  Generalized weakness/recurrent falls: Multifactorial including UTI, arthritis and degenerative disc disease.   cervical, thoracic and lumbar spine CT with advanced degenerative changes, progressive multilevel lower thoracic spine and lumbar stenosis, at least moderate at T12-L1, moderate to severe at L1-L2 and L2-L3.  Has prior lumbar laminectomy.  Exam with significant BLE weakness and paresthesia.  Paresthesia likely from diabetic neuropathy. -Outpatient follow-up with neurosurgery or orthopedic surgery -Fall precaution, PT/OT  NIDDM-2 with hyperglycemia and neuropathy: A1c  6.4%.  On metformin and Ozempic at home. Recent Labs  Lab 02/19/24 2212 02/20/24 0804  GLUCAP 158* 148*  -Continue SSI-moderate. -Continue home gabapentin.  Traumatic rhabdomyolysis: CK  elevated to 700s.  She is also on high-dose Lipitor. -Decrease LR to 100 cc an hour. -Recheck CK -Hold Lipitor  Essential hypertension:BP within acceptable range -Continue home Toprol-XL   History of asthma: No acute exacerbation.   -Continue breathing treatments   Depression with anxiety: Stable -Continue home regimen   History of SDH/TBI/neurocognitive deficit: Awake and alert and oriented    Urinary incontinence:  -Continue home regimen of Tobias on hospital formulary   GERD:  -Continue with PPIs  Constipation -Bowel regimen. -May have to hold Lamictal if no success.   Obesity -Check BMI There is no height or weight on file to calculate BMI.           DVT prophylaxis:  enoxaparin (LOVENOX) injection 40 mg Start: 02/20/24 0800  Code Status: Full code Family Communication: None at bedside Level of care: Med-Surg Status is: Inpatient Remains inpatient appropriate because: UTI, generalized weakness and recurrent fall   Final disposition: SNF Consultants:  None  55 minutes with more than 50% spent in reviewing records, counseling patient/family and coordinating care.   Sch Meds:  Scheduled Meds:  aspirin EC  81 mg Oral Daily   enoxaparin (LOVENOX) injection  40 mg Subcutaneous Q24H   fesoterodine  4 mg Oral Daily   fluticasone  1 spray Each Nare Daily   gabapentin  100 mg Oral TID   insulin aspart  0-15 Units Subcutaneous TID WC   insulin aspart  0-5 Units Subcutaneous QHS   levothyroxine  50 mcg Oral Q0600   loratadine  10 mg Oral Daily   metoprolol succinate  25 mg Oral Daily   mometasone-formoterol  2 puff Inhalation BID   multivitamin with minerals  1 tablet Oral Daily   pantoprazole  40 mg Oral Daily   Continuous Infusions:  cefTRIAXone (ROCEPHIN)  IV     lactated ringers     PRN Meds:.acetaminophen, albuterol, diphenoxylate-atropine, meclizine, ondansetron **OR** ondansetron (ZOFRAN) IV, polyethylene glycol,  senna-docusate  Antimicrobials: Anti-infectives (From admission, onward)    Start     Dose/Rate Route Frequency Ordered Stop   02/20/24 1900  cefTRIAXone (ROCEPHIN) 2 g in sodium chloride 0.9 % 100 mL IVPB        2 g 200 mL/hr over 30 Minutes Intravenous Every 24 hours 02/19/24 2210 02/26/24 1859   02/19/24 1930  cefTRIAXone (ROCEPHIN) 1 g in sodium chloride 0.9 % 100 mL IVPB        1 g 200 mL/hr over 30 Minutes Intravenous  Once 02/19/24 1920 02/19/24 2010        I have personally reviewed the following labs and images: CBC: Recent Labs  Lab 02/19/24 1058 02/19/24 2254 02/20/24 0726  WBC 12.7* 12.1* 9.1  NEUTROABS 9.6*  --   --   HGB 12.5 13.3 11.5*  HCT 38.4 40.9 35.4*  MCV 87.3 88.5 87.8  PLT 311 332 265   BMP &GFR Recent Labs  Lab 02/19/24 1058 02/19/24 2254 02/20/24 0726  NA 135  --  133*  K 4.2  --  3.9  CL 102  --  101  CO2 19*  --  22  GLUCOSE 148*  --  147*  BUN 20  --  12  CREATININE 0.81 0.82 0.77  CALCIUM 9.0  --  8.3*   CrCl cannot be calculated (Unknown  ideal weight.). Liver & Pancreas: Recent Labs  Lab 02/19/24 1058 02/20/24 0726  AST 54* 36  ALT 22 25  ALKPHOS 63 54  BILITOT 1.5* 0.9  PROT 6.3* 5.6*  ALBUMIN 3.5 2.9*   No results for input(s): "LIPASE", "AMYLASE" in the last 168 hours. No results for input(s): "AMMONIA" in the last 168 hours. Diabetic: Recent Labs    02/19/24 2254  HGBA1C 6.4*   Recent Labs  Lab 02/19/24 2212 02/20/24 0804  GLUCAP 158* 148*   Cardiac Enzymes: Recent Labs  Lab 02/19/24 1058 02/20/24 0726  CKTOTAL 745* 444*   No results for input(s): "PROBNP" in the last 8760 hours. Coagulation Profile: Recent Labs  Lab 02/20/24 0726  INR 1.2   Thyroid Function Tests: No results for input(s): "TSH", "T4TOTAL", "FREET4", "T3FREE", "THYROIDAB" in the last 72 hours. Lipid Profile: No results for input(s): "CHOL", "HDL", "LDLCALC", "TRIG", "CHOLHDL", "LDLDIRECT" in the last 72 hours. Anemia  Panel: No results for input(s): "VITAMINB12", "FOLATE", "FERRITIN", "TIBC", "IRON", "RETICCTPCT" in the last 72 hours. Urine analysis:    Component Value Date/Time   COLORURINE AMBER (A) 02/19/2024 1728   APPEARANCEUR TURBID (A) 02/19/2024 1728   LABSPEC 1.015 02/19/2024 1728   PHURINE 5.0 02/19/2024 1728   GLUCOSEU NEGATIVE 02/19/2024 1728   GLUCOSEU >=1000 (A) 07/01/2019 1429   HGBUR SMALL (A) 02/19/2024 1728   BILIRUBINUR NEGATIVE 02/19/2024 1728   KETONESUR NEGATIVE 02/19/2024 1728   PROTEINUR 100 (A) 02/19/2024 1728   UROBILINOGEN 0.2 07/01/2019 1429   NITRITE POSITIVE (A) 02/19/2024 1728   LEUKOCYTESUR LARGE (A) 02/19/2024 1728   Sepsis Labs: Invalid input(s): "PROCALCITONIN", "LACTICIDVEN"  Microbiology: Recent Results (from the past 240 hours)  Urine Culture     Status: None (Preliminary result)   Collection Time: 02/19/24  5:28 PM   Specimen: Urine, Random  Result Value Ref Range Status   Specimen Description URINE, RANDOM  Final   Special Requests   Final    URINE, CATHETERIZED Performed at [facility], [address] [place], [place] [address]    Culture PENDING  Incomplete   Report Status PENDING  Incomplete  Culture, blood (Routine X 2) w Reflex to ID Panel     Status: None (Preliminary result)   Collection Time: 02/19/24 10:54 PM   Specimen: BLOOD LEFT ARM  Result Value Ref Range Status   Specimen Description BLOOD LEFT ARM  Final   Special Requests   Final    BOTTLES DRAWN AEROBIC AND ANAEROBIC Blood Culture adequate volume   Culture   Final    NO GROWTH < 12 HOURS Performed at [facility], [address], [place], [place] [address]    Report Status PENDING  Incomplete  Culture, blood (Routine X 2) w Reflex to ID Panel     Status: None (Preliminary result)   Collection Time: 02/19/24 10:57 PM   Specimen: BLOOD LEFT HAND  Result Value Ref Range Status   Specimen Description BLOOD LEFT HAND  Final   Special Requests   Final    BOTTLES  DRAWN AEROBIC AND ANAEROBIC Blood Culture adequate volume   Culture   Final    NO GROWTH < 12 HOURS Performed at [facility], [address], [place], [place] [address]    Report Status PENDING  Incomplete    Radiology Studies: CT Lumbar Spine Wo Contrast Result Date: 02/19/2024 CLINICAL DATA:  78 year old female status post fall sometime in the night. Down for approximately 8 hours. Pain. Ataxia.  EXAM: CT LUMBAR SPINE WITHOUT CONTRAST TECHNIQUE: Multidetector CT imaging of the lumbar spine was performed without intravenous contrast administration. Multiplanar CT image reconstructions were also generated. RADIATION DOSE REDUCTION: This exam was performed according to the departmental dose-optimization program which includes automated exposure control, adjustment of the mA and/or kV according to patient size and/or use of iterative reconstruction technique. COMPARISON:  Thoracic spine CT today reported separately. FINDINGS: Segmentation: Normal, concordant with thoracic lumbar and today. Alignment: Straightening of lumbar lordosis with no significant spondylolisthesis or scoliosis. Vertebrae: Degenerative bone heterogeneity of bone mineralization throughout the visible spine. Mild motion artifact at the L3 and L4 levels. Maintained lumbar vertebral body height. Visible sacrum and SI joints appear intact. No acute osseous abnormality identified. Paraspinal and other soft tissues: Aortoiliac calcified atherosclerosis. Negative other visible noncontrast abdominal viscera. Lumbar paraspinal soft tissues remain within normal limits. Disc levels: Bulky lumbar spine degeneration. Multifactorial spinal stenosis at L1-L2 appears moderate to severe and asymmetric to the right (series 6, image 26). Moderate to severe L2-L3 multifactorial spinal stenosis with pronounced vacuum disc there. IMPRESSION: 1. No acute traumatic injury identified in the Lumbar Spine. 2. Widespread severe lumbar spine degeneration, with  suspected Moderate Or Severe Multifactorial Spinal Stenosis at both L1-L2 and L2-L3. 3.  Aortic Atherosclerosis (ICD10-I70.0). Electronically Signed   By: Odessa Fleming M.D.   On: 02/19/2024 13:12   CT Thoracic Spine Wo Contrast Result Date: 02/19/2024 CLINICAL DATA:  78 year old female status post fall sometime in the night. Down for approximately 8 hours. Pain. Ataxia. EXAM: CT THORACIC SPINE WITHOUT CONTRAST TECHNIQUE: Multidetector CT images of the thoracic were obtained using the standard protocol without intravenous contrast. RADIATION DOSE REDUCTION: This exam was performed according to the departmental dose-optimization program which includes automated exposure control, adjustment of the mA and/or kV according to patient size and/or use of iterative reconstruction technique. COMPARISON:  Cervical spine CT today. Prior chest CTA 11/10/2021. FINDINGS: Limited cervical spine imaging:  Detailed separately today. Thoracic spine segmentation: Hypoplastic ribs at T12, otherwise normal. Alignment: Stable mild dextroconvex thoracic scoliosis since 2022. Subtle degenerative multilevel upper thoracic anterolisthesis not significantly changed. Subtle degenerative anterolisthesis of T8 on T9 has progressed since 2022. Vertebrae: Maintained thoracic vertebral body height, stable since 2022. Background bone mineralization within normal limits. Widespread degenerative sclerosis of thoracic vertebrae. No acute osseous abnormality identified. Visible posterior ribs appear intact. Paraspinal and other soft tissues: Calcified aortic atherosclerosis. Visible lung parenchyma not significantly changed since 2022, scattered mild bilateral atelectasis or scarring. No evidence of pericardial or pleural effusion. Calcified coronary artery plaque or stents. Negative visible noncontrast upper abdominal viscera. Thoracic paraspinal soft tissues are within normal limits. Disc levels: Chronic severe thoracic spine degeneration is widespread.  Chronic severe disc and endplate degeneration especially in the lower thoracic spine with vacuum disc T8-T9 through T11-T12. Multifactorial degenerative lower thoracic spinal stenosis at both T11-T12 and T12-L1, at least moderate at the latter (series 4, image 148) which was not included on the previous CTA. Mild multifactorial spinal stenosis suspected at T9-T10 and appears increased. IMPRESSION: 1. No acute traumatic injury identified in the Thoracic Spine. 2. Chronic severe Thoracic Spine degeneration. Multilevel Lower Thoracic Spinal Stenosis appears progressed from a 2022 Chest CTA, and is at least moderate at T12-L1. 3.  Aortic Atherosclerosis (ICD10-I70.0). Electronically Signed   By: Odessa Fleming M.D.   On: 02/19/2024 13:09   CT Cervical Spine Wo Contrast Result Date: 02/19/2024 CLINICAL DATA:  78 year old female status post fall sometime in the night. Down  for approximately 8 hours. Pain. Ataxia. EXAM: CT CERVICAL SPINE WITHOUT CONTRAST TECHNIQUE: Multidetector CT imaging of the cervical spine was performed without intravenous contrast. Multiplanar CT image reconstructions were also generated. RADIATION DOSE REDUCTION: This exam was performed according to the departmental dose-optimization program which includes automated exposure control, adjustment of the mA and/or kV according to patient size and/or use of iterative reconstruction technique. COMPARISON:  Head CT today reported separately. Prior cervical spine CT 03/13/2020. FINDINGS: Alignment: Stable straightening of cervical lordosis. Stable cervicothoracic junction alignment. Maintained posterior element alignment. Skull base and vertebrae: Bone mineralization is within normal limits for age. Visualized skull base is intact. No atlanto-occipital dissociation. C1 and C2 appear intact and aligned. No acute osseous abnormality identified. Soft tissues and spinal canal: No prevertebral fluid or swelling. No visible canal hematoma. Calcified cervical carotid  artery atherosclerosis. Negative visible noncontrast neck soft tissues. Disc levels: Advanced cervical spine degeneration, especially right side facet arthropathy and lower cervical disc and endplate degeneration. Evidence of developing degenerative ankylosis of C6-C7. No significant change by CT since 2021. Upper chest: Thoracic spine CT today reported separately. Negative lung apices. Calcified aortic atherosclerosis. IMPRESSION: 1. No acute traumatic injury identified in the cervical spine. 2. Advanced cervical spine degeneration, not significantly changed by CT since 2021. 3. Thoracic spine CT today reported separately. 4.  Aortic Atherosclerosis (ICD10-I70.0). Electronically Signed   By: Odessa Fleming M.D.   On: 02/19/2024 13:03   DG Chest Portable 1 View Result Date: 02/19/2024 CLINICAL DATA:  78 year old female status post fall sometime in the night. Down for approximately 8 hours. Pain. Ataxia. EXAM: PORTABLE CHEST 1 VIEW COMPARISON:  Chest radiographs 12/23/2021. FINDINGS: Portable AP supine views at 1103 hours. Normal lung volumes and mediastinal contours. Visualized tracheal air column is within normal limits. Allowing for portable technique the lungs are clear. No evidence of pneumothorax or pleural effusion on the supine views. Paucity of bowel gas in the upper abdomen. Calcified aortic atherosclerosis. No acute osseous abnormality identified. IMPRESSION: No acute cardiopulmonary abnormality or acute traumatic injury identified. Electronically Signed   By: Odessa Fleming M.D.   On: 02/19/2024 12:59   DG Pelvis 1-2 Views Result Date: 02/19/2024 CLINICAL DATA:  78 year old female status post fall sometime in the night. Down for approximately 8 hours. Pain. Ataxia. EXAM: PELVIS - 1-2 VIEW COMPARISON:  None Available. FINDINGS: Portable AP supine view at 1100 hours. Bone mineralization is within normal limits for age. Femoral heads normally located. Grossly intact proximal femurs. No pelvis fracture identified. SI  joints appear symmetric. Nonobstructed bowel-gas pattern. IMPRESSION: No acute fracture or dislocation identified about the pelvis. Electronically Signed   By: Odessa Fleming M.D.   On: 02/19/2024 12:58   CT Head Wo Contrast Result Date: 02/19/2024 CLINICAL DATA:  78 year old female status post fall sometime in the night. Down for approximately 8 hours. Pain. Ataxia. EXAM: CT HEAD WITHOUT CONTRAST TECHNIQUE: Contiguous axial images were obtained from the base of the skull through the vertex without intravenous contrast. RADIATION DOSE REDUCTION: This exam was performed according to the departmental dose-optimization program which includes automated exposure control, adjustment of the mA and/or kV according to patient size and/or use of iterative reconstruction technique. COMPARISON:  Brain MRI 05/09/2020.  Head CT 12/23/2021. FINDINGS: Brain: Stable cerebral volume. No midline shift, ventriculomegaly, mass effect, evidence of mass lesion, intracranial hemorrhage or evidence of cortically based acute infarction. Chronic basal ganglia vascular calcifications and heterogeneous hypodensity. Patchy mild to moderate for age periventricular white matter hypodensity. Stable  gray-white matter differentiation throughout the brain. Vascular: Calcified atherosclerosis at the skull base. No suspicious intracranial vascular hyperdensity. Skull: Stable, intact.  No acute osseous abnormality identified. Sinuses/Orbits: Visualized paranasal sinuses and mastoids are stable and well aerated. Other: No acute orbit or scalp soft tissue injury identified. IMPRESSION: 1. No acute intracranial abnormality or acute traumatic injury identified. 2. Stable non contrast CT appearance of chronic small vessel disease. Electronically Signed   By: Odessa Fleming M.D.   On: 02/19/2024 12:57      Laylah Riga T. Reinhardt Licausi [facility]  If 7PM-7AM, please contact night-coverage [email or web address] 02/20/2024, 10:54 AM

## 2024-02-20 NOTE — Evaluation (Signed)
 Occupational Therapy Evaluation Patient Details Name: Alexandra Wolfe MRN: [id number] DOB: [birth year] Today's Date: 02/20/2024   History of Present Illness   78 y.o. female admitted 3/13 with falls and UTI. PMhx: hypothyroidism, DM, HTN, diabetic neuropathy, asthma, morbid obesity, TBI, GERD, chronic pain issues, anxiety with depression, HLD, urinary incontinence, vertigo     Clinical Impressions Patient admitted for the diagnosis above.  PTA she lives alone, remains independent with ADL, iADL and household mobility.  Son assists with community mobility.  Currently she is needing up to Mod A for basic mobility and LB ADL from a sit to stand level.  OT will follow in the acute setting to address deficits.  Patient will benefit from continued inpatient follow up therapy, <3 hours/day.     If plan is discharge home, recommend the following:   Assist for transportation;Assistance with cooking/housework;A lot of help with bathing/dressing/bathroom;A lot of help with walking and/or transfers     Functional Status Assessment   Patient has had a recent decline in their functional status and demonstrates the ability to make significant improvements in function in a reasonable and predictable amount of time.     Equipment Recommendations   BSC/3in1     Recommendations for Other Services         Precautions/Restrictions   Precautions Precautions: Fall Recall of Precautions/Restrictions: Impaired Restrictions Weight Bearing Restrictions Per Provider Order: No     Mobility Bed Mobility Overal bed mobility: Needs Assistance Bed Mobility: Supine to Sit, Sit to Supine     Supine to sit: Mod assist, HOB elevated, Used rails Sit to supine: Mod assist        Transfers Overall transfer level: Needs assistance   Transfers: Sit to/from Stand Sit to Stand: Mod assist                  Balance Overall balance assessment: Needs assistance Sitting-balance support:  Feet supported Sitting balance-Leahy Scale: Fair     Standing balance support: Reliant on assistive device for balance Standing balance-Leahy Scale: Poor                             ADL either performed or assessed with clinical judgement   ADL Overall ADL's : Needs assistance/impaired Eating/Feeding: Set up;Sitting   Grooming: Wash/dry hands;Wash/dry face;Supervision/safety;Sitting   Upper Body Bathing: Minimal assistance;Sitting   Lower Body Bathing: Moderate assistance;Sit to/from stand   Upper Body Dressing : Minimal assistance;Sitting   Lower Body Dressing: Moderate assistance;Sit to/from stand   Toilet Transfer: Minimal assistance;Stand-pivot;Rolling walker (2 wheels);BSC/3in1;Moderate assistance                   Vision Baseline Vision/History: 1 Wears glasses Patient Visual Report: No change from baseline       Perception Perception: Not tested       Praxis Praxis: Not tested       Pertinent Vitals/Pain Pain Assessment Pain Assessment: Faces Faces Pain Scale: Hurts even more Pain Location: right hip, left knee Pain Descriptors / Indicators: Sore, Grimacing Pain Intervention(s): Monitored during session     Extremity/Trunk Assessment Upper Extremity Assessment Upper Extremity Assessment: Generalized weakness   Lower Extremity Assessment Lower Extremity Assessment: Defer to PT evaluation   Cervical / Trunk Assessment Cervical / Trunk Assessment: Normal   Communication Communication Communication: No apparent difficulties   Cognition Arousal: Alert   Cognition: Cognition impaired       Memory impairment (select all impairments):  Short-term memory, Working memory Attention impairment (select first level of impairment): Selective attention Executive functioning impairment (select all impairments): Reasoning, Problem solving                   Following commands: Intact       Cueing  General Comments   Cueing  Techniques: Gestural cues;Tactile cues;Verbal cues   VSS on RA   Exercises     Shoulder Instructions      Home Living Family/patient expects to be discharged to:: Private residence Living Arrangements: Alone Available Help at Discharge: Family;Available PRN/intermittently Type of Home: Apartment Home Access: Level entry     Home Layout: One level     Bathroom Shower/Tub: Chief Strategy Officer: Standard Bathroom Accessibility: Yes How Accessible: Accessible via walker Home Equipment: Toilet riser;Rollator (4 wheels);Wheelchair - Animator;Other (comment)   Additional Comments: patient stating she sleeps in the bed, and will nap in the recliner during the day.  Has a life alert      Prior Functioning/Environment Prior Level of Function : Independent/Modified Independent             Mobility Comments: pt uses lift chair to stand and reports moving on her own in home but with falls over the last few days ADLs Comments: son drives and assist with groceries.  Patient performs her own ADL, iADL, medications and bill payment    OT Problem List: Decreased strength;Decreased activity tolerance;Impaired balance (sitting and/or standing);Pain   OT Treatment/Interventions: Self-care/ADL training;Therapeutic activities;Therapeutic exercise;Patient/family education;Balance training;DME and/or AE instruction;Energy conservation      OT Goals(Current goals can be found in the care plan section)   Acute Rehab OT Goals Patient Stated Goal: Return home OT Goal Formulation: With patient Time For Goal Achievement: 03/05/24 Potential to Achieve Goals: Good ADL Goals Pt Will Perform Grooming: with supervision;standing Pt Will Perform Upper Body Dressing: with set-up;sitting Pt Will Perform Lower Body Dressing: with contact guard assist;sit to/from stand Pt Will Transfer to Toilet: with supervision;ambulating;regular height  toilet Pt/caregiver will Perform Home Exercise Program: Increased strength;Both right and left upper extremity;With theraband;With written HEP provided   OT Frequency:  Min 1X/week    Co-evaluation              AM-PAC OT "6 Clicks" Daily Activity     Outcome Measure Help from another person eating meals?: None Help from another person taking care of personal grooming?: A Little Help from another person toileting, which includes using toliet, bedpan, or urinal?: A Lot Help from another person bathing (including washing, rinsing, drying)?: A Lot Help from another person to put on and taking off regular upper body clothing?: A Little Help from another person to put on and taking off regular lower body clothing?: A Lot 6 Click Score: 16   End of Session Equipment Utilized During Treatment: Rolling walker (2 wheels) Nurse Communication: Mobility status  Activity Tolerance: Patient tolerated treatment well Patient left: in bed;with call bell/phone within reach;with bed alarm set  OT Visit Diagnosis: Unsteadiness on feet (R26.81);Muscle weakness (generalized) (M62.81)                Time: 5621-3086 OT Time Calculation (min): 21 min Charges:  OT General Charges $OT Visit: 1 Visit OT Evaluation $OT Eval Moderate Complexity: 1 Mod  02/20/2024  RP, OTR/L  Acute Rehabilitation Services  Office:  [phone]   Suzanna Obey 02/20/2024, 1:34 PM

## 2024-02-21 DIAGNOSIS — R29818 Other symptoms and signs involving the nervous system: Secondary | ICD-10-CM | POA: Diagnosis not present

## 2024-02-21 DIAGNOSIS — I10 Essential (primary) hypertension: Secondary | ICD-10-CM | POA: Diagnosis not present

## 2024-02-21 DIAGNOSIS — J45909 Unspecified asthma, uncomplicated: Secondary | ICD-10-CM | POA: Diagnosis not present

## 2024-02-21 DIAGNOSIS — N3000 Acute cystitis without hematuria: Secondary | ICD-10-CM | POA: Diagnosis not present

## 2024-02-21 LAB — RENAL FUNCTION PANEL
Albumin: 2.9 g/dL — ABNORMAL LOW (ref 3.5–5.0)
Anion gap: 7 (ref 5–15)
BUN: 12 mg/dL (ref 8–23)
CO2: 24 mmol/L (ref 22–32)
Calcium: 8.5 mg/dL — ABNORMAL LOW (ref 8.9–10.3)
Chloride: 105 mmol/L (ref 98–111)
Creatinine, Ser: 0.76 mg/dL (ref 0.44–1.00)
GFR, Estimated: >60 mL/min (ref >60)
Glucose, Bld: 150 mg/dL — ABNORMAL HIGH (ref 70–99)
Phosphorus: 4.2 mg/dL (ref 2.5–4.6)
Potassium: 3.7 mmol/L (ref 3.5–5.1)
Sodium: 136 mmol/L (ref 135–145)

## 2024-02-21 LAB — CBC
HCT: 34.7 % — ABNORMAL LOW (ref 36.0–46.0)
Hemoglobin: 11.2 g/dL — ABNORMAL LOW (ref 12.0–15.0)
MCH: 28.1 pg (ref 26.0–34.0)
MCHC: 32.3 g/dL (ref 30.0–36.0)
MCV: 87.2 fL (ref 80.0–100.0)
Platelets: 261 10*3/uL (ref 150–400)
RBC: 3.98 MIL/uL (ref 3.87–5.11)
RDW: 14.1 % (ref 11.5–15.5)
WBC: 7.3 10*3/uL (ref 4.0–10.5)
nRBC: 0 % (ref 0.0–0.2)

## 2024-02-21 LAB — GLUCOSE, CAPILLARY
Glucose-Capillary: 135 mg/dL — ABNORMAL HIGH (ref 70–99)
Glucose-Capillary: 109 mg/dL — ABNORMAL HIGH (ref 70–99)
Glucose-Capillary: 118 mg/dL — ABNORMAL HIGH (ref 70–99)
Glucose-Capillary: 135 mg/dL — ABNORMAL HIGH (ref 70–99)

## 2024-02-21 LAB — CK: Total CK: 335 U/L — ABNORMAL HIGH (ref 38–234)

## 2024-02-21 LAB — MAGNESIUM: Magnesium: 1.8 mg/dL (ref 1.7–2.4)

## 2024-02-21 NOTE — Progress Notes (Signed)
 PROGRESS NOTE  Alexandra Wolfe NGE:[id number] DOB: Nov 26, 1946   PCP: Corwin Levins, MD  Patient is from: Home.  Lives alone.  Uses rolling walker at baseline.  DOA: 02/19/2024 LOS: 2  Chief complaints Chief Complaint  Patient presents with   Fall     Brief Narrative / Interim history: 78 year old F with PMH of DM-2 with neuropathy, morbid obesity, asthma, urinary incontinence, SDH/TBI/neurocognitive deficit, osteoarthritis, lumbar laminectomy, hypothyroidism, anxiety, depression and GERD presenting with recurrent falls, dysuria and suprapubic pain, and admitted with working diagnosis of UTI, generalized weakness and recurrent falls.  Patient reports dysuria for about a week and suprapubic tenderness and pain for over 2 weeks.  Chronic urinary incontinence.   In ED, stable vitals.  Afebrile.  CMP with mild hyperglycemia and mildly elevated AST.  T. bili 1.5.  WBC 12.7 with left shift.  CK5 147.  CXR and pelvic x-ray without acute finding.  CT head without acute finding.  Cervical, thoracic and lumbar spine CT with advanced degenerative changes, progressive multilevel lower thoracic spine and lumbar stenosis, at least moderate at T12-L1, moderate to severe at L1-L2 and L2-L3.  UA consistent with UTI.  Cultures obtained.  Started on IV fluid and IV ceftriaxone.   Blood cultures NGTD.  Urine culture with E. coli and Klebsiella.  Sensitivities on Klebsiella pending.  Therapy recommended SNF.  Subjective: Seen and examined earlier this morning.  No major events overnight of this morning.  Reports improvement in dysuria and suprapubic pain.  No complaints.  Objective: Vitals:   02/20/24 2020 02/21/24 0444 02/21/24 0741 02/21/24 0900  BP: 138/60 (!) 152/60 139/64   Pulse: 87 84 79   Resp: 18 19 18    Temp: 98.9 F (37.2 C) 98.4 F (36.9 C) 97.7 F (36.5 C)   TempSrc: Oral Oral Oral   SpO2: 94% 94% 95%   Weight:    103 kg  Height:    5\' 6"  (1.676 m)    Examination:  GENERAL: No  apparent distress.  Nontoxic. HEENT: MMM.  Vision and hearing grossly intact.  NECK: Supple.  No apparent JVD.  RESP:  No IWOB.  Fair aeration bilaterally. CVS:  RRR. Heart sounds normal.  ABD/GI/GU: BS+. Abd soft, NTND.  MSK/EXT:  Moves extremities. No apparent deformity. No edema.  SKIN: no apparent skin lesion or wound NEURO: Awake, alert and oriented appropriately.  No apparent focal neuro deficit other than significant symmetric BLE weakness and paresthesia.  Mod 2+/5 on the left and 3/5 on the right with bilateral knee extension and hip flexion. PSYCH: Calm. Normal affect.   Procedures:  None  Microbiology summarized: Blood cultures NGTD Urine culture with Klebsiella aerogenes and pansensitive E. coli.  Assessment and plan: Sepsis due to urinary tract infection: Present on admission.  Heart tachycardia, tachypnea and leukocytosis.  Has dysuria with suprapubic pain.  Blood cultures NGTD.  Urine culture as above. -Continue IV ceftriaxone pending sensitivity on Klebsiella.  Generalized weakness/recurrent falls: Multifactorial including UTI, arthritis and degenerative disc disease. Cervical, thoracic and lumbar spine CT with advanced degenerative changes, progressive multilevel lower thoracic spine and lumbar stenosis, at least moderate at T12-L1, moderate to severe at L1-L2 and L2-L3.  Has prior lumbar laminectomy.  Exam with significant BLE weakness and paresthesia.  Paresthesia likely from diabetic neuropathy. -Outpatient follow-up with neurosurgery or orthopedic surgery -Fall precaution,  -PT/OT-recommended SNF.  NIDDM-2 with hyperglycemia and neuropathy: A1c 6.4%.  On metformin and Ozempic at home. Recent Labs  Lab 02/20/24 0804 02/20/24 1155  02/20/24 1628 02/20/24 2112 02/21/24 0739  GLUCAP 148* 132* 145* 131* 135*  -Continue SSI-moderate. -Continue home gabapentin.  Traumatic rhabdomyolysis: CK trended from 702-300.  Patient is on high-dose Lipitor. -Monitor off IV  fluid -Continue holding Lipitor.  Essential hypertension:BP within acceptable range -Continue home Toprol-XL   History of asthma: No acute exacerbation.   -Continue breathing treatments   Depression with anxiety: Stable -Continue home regimen   History of SDH/TBI/neurocognitive deficit: Awake and alert and oriented    Urinary incontinence:  -Continue home regimen of Marlan Palau on hospital formulary   GERD:  -Continue with PPIs  Constipation -Bowel regimen.   Morbid obesity: Elevated BMI with comorbidity as above. Body mass index is 36.65 kg/m. -Consider GLP-1 agonist -Encourage lifestyle change to lose weight          DVT prophylaxis:  enoxaparin (LOVENOX) injection 40 mg Start: 02/20/24 0800  Code Status: Full code Family Communication: Updated patient's son from patient's phone. Level of care: Med-Surg Status is: Inpatient Remains inpatient appropriate because: UTI, generalized weakness and recurrent fall   Final disposition: SNF Consultants:  None  55 minutes with more than 50% spent in reviewing records, counseling patient/family and coordinating care.   Sch Meds:  Scheduled Meds:  aspirin EC  81 mg Oral Daily   enoxaparin (LOVENOX) injection  40 mg Subcutaneous Q24H   fesoterodine  4 mg Oral Daily   fluticasone  1 spray Each Nare Daily   gabapentin  100 mg Oral TID   insulin aspart  0-15 Units Subcutaneous TID WC   insulin aspart  0-5 Units Subcutaneous QHS   levothyroxine  50 mcg Oral Q0600   loratadine  10 mg Oral Daily   metoprolol succinate  25 mg Oral Daily   mometasone-formoterol  2 puff Inhalation BID   multivitamin with minerals  1 tablet Oral Daily   pantoprazole  40 mg Oral Daily   Continuous Infusions:  cefTRIAXone (ROCEPHIN)  IV 2 g (02/20/24 1730)   PRN Meds:.acetaminophen, ACT Dry Mouth, albuterol, diphenoxylate-atropine, meclizine, ondansetron **OR** ondansetron (ZOFRAN) IV, polyethylene glycol,  senna-docusate  Antimicrobials: Anti-infectives (From admission, onward)    Start     Dose/Rate Route Frequency Ordered Stop   02/20/24 1900  cefTRIAXone (ROCEPHIN) 2 g in sodium chloride 0.9 % 100 mL IVPB        2 g 200 mL/hr over 30 Minutes Intravenous Every 24 hours 02/19/24 2210 02/26/24 1859   02/19/24 1930  cefTRIAXone (ROCEPHIN) 1 g in sodium chloride 0.9 % 100 mL IVPB        1 g 200 mL/hr over 30 Minutes Intravenous  Once 02/19/24 1920 02/19/24 2010        I have personally reviewed the following labs and images: CBC: Recent Labs  Lab 02/19/24 1058 02/19/24 2254 02/20/24 0726 02/21/24 0623  WBC 12.7* 12.1* 9.1 7.3  NEUTROABS 9.6*  --   --   --   HGB 12.5 13.3 11.5* 11.2*  HCT 38.4 40.9 35.4* 34.7*  MCV 87.3 88.5 87.8 87.2  PLT 311 332 265 261   BMP &GFR Recent Labs  Lab 02/19/24 1058 02/19/24 2254 02/20/24 0726 02/21/24 0623  NA 135  --  133* 136  K 4.2  --  3.9 3.7  CL 102  --  101 105  CO2 19*  --  22 24  GLUCOSE 148*  --  147* 150*  BUN 20  --  12 12  CREATININE 0.81 0.82 0.77 0.76  CALCIUM 9.0  --  8.3* 8.5*  MG  --   --   --  1.8  PHOS  --   --   --  4.2   Estimated Creatinine Clearance: 70.3 mL/min (by C-G formula based on SCr of 0.76 mg/dL). Liver & Pancreas: Recent Labs  Lab 02/19/24 1058 02/20/24 0726 02/21/24 0623  AST 54* 36  --   ALT 22 25  --   ALKPHOS 63 54  --   BILITOT 1.5* 0.9  --   PROT 6.3* 5.6*  --   ALBUMIN 3.5 2.9* 2.9*   No results for input(s): "LIPASE", "AMYLASE" in the last 168 hours. No results for input(s): "AMMONIA" in the last 168 hours. Diabetic: Recent Labs    02/19/24 2254  HGBA1C 6.4*   Recent Labs  Lab 02/20/24 0804 02/20/24 1155 02/20/24 1628 02/20/24 2112 02/21/24 0739  GLUCAP 148* 132* 145* 131* 135*   Cardiac Enzymes: Recent Labs  Lab 02/19/24 1058 02/20/24 0726 02/21/24 0623  CKTOTAL 745* 444* 335*   No results for input(s): "PROBNP" in the last 8760 hours. Coagulation  Profile: Recent Labs  Lab 02/20/24 0726  INR 1.2   Thyroid Function Tests: No results for input(s): "TSH", "T4TOTAL", "FREET4", "T3FREE", "THYROIDAB" in the last 72 hours. Lipid Profile: No results for input(s): "CHOL", "HDL", "LDLCALC", "TRIG", "CHOLHDL", "LDLDIRECT" in the last 72 hours. Anemia Panel: No results for input(s): "VITAMINB12", "FOLATE", "FERRITIN", "TIBC", "IRON", "RETICCTPCT" in the last 72 hours. Urine analysis:    Component Value Date/Time   COLORURINE AMBER (A) 02/19/2024 1728   APPEARANCEUR TURBID (A) 02/19/2024 1728   LABSPEC 1.015 02/19/2024 1728   PHURINE 5.0 02/19/2024 1728   GLUCOSEU NEGATIVE 02/19/2024 1728   GLUCOSEU >=1000 (A) 07/01/2019 1429   HGBUR SMALL (A) 02/19/2024 1728   BILIRUBINUR NEGATIVE 02/19/2024 1728   KETONESUR NEGATIVE 02/19/2024 1728   PROTEINUR 100 (A) 02/19/2024 1728   UROBILINOGEN 0.2 07/01/2019 1429   NITRITE POSITIVE (A) 02/19/2024 1728   LEUKOCYTESUR LARGE (A) 02/19/2024 1728   Sepsis Labs: Invalid input(s): "PROCALCITONIN", "LACTICIDVEN"  Microbiology: Recent Results (from the past 240 hours)  Urine Culture     Status: Abnormal (Preliminary result)   Collection Time: 02/19/24  5:28 PM   Specimen: Urine, Random  Result Value Ref Range Status   Specimen Description URINE, RANDOM  Final   Special Requests URINE, CATHETERIZED  Final   Culture (A)  Final    >=100,000 COLONIES/mL ESCHERICHIA COLI 80,000 COLONIES/mL KLEBSIELLA AEROGENES SUSCEPTIBILITIES TO FOLLOW Performed at [facility], [address] [place], [place] [address]    Report Status PENDING  Incomplete   Organism ID, Bacteria ESCHERICHIA COLI (A)  Final      Susceptibility   Escherichia coli - MIC*    AMPICILLIN <=2 SENSITIVE Sensitive     CEFAZOLIN <=4 SENSITIVE Sensitive     CEFEPIME <=0.12 SENSITIVE Sensitive     CEFTRIAXONE <=0.25 SENSITIVE Sensitive     CIPROFLOXACIN <=0.25 SENSITIVE Sensitive     GENTAMICIN <=1 SENSITIVE Sensitive      IMIPENEM <=0.25 SENSITIVE Sensitive     NITROFURANTOIN <=16 SENSITIVE Sensitive     TRIMETH/SULFA <=20 SENSITIVE Sensitive     AMPICILLIN/SULBACTAM <=2 SENSITIVE Sensitive     PIP/TAZO <=4 SENSITIVE Sensitive ug/mL    * >=100,000 COLONIES/mL ESCHERICHIA COLI  Culture, blood (Routine X 2) w Reflex to ID Panel     Status: None (Preliminary result)   Collection Time: 02/19/24 10:54 PM   Specimen: BLOOD LEFT ARM  Result Value Ref  Range Status   Specimen Description BLOOD LEFT ARM  Final   Special Requests   Final    BOTTLES DRAWN AEROBIC AND ANAEROBIC Blood Culture adequate volume   Culture   Final    NO GROWTH 2 DAYS Performed at [facility], [address] [place], [place] [address]    Report Status PENDING  Incomplete  Culture, blood (Routine X 2) w Reflex to ID Panel     Status: None (Preliminary result)   Collection Time: 02/19/24 10:57 PM   Specimen: BLOOD LEFT HAND  Result Value Ref Range Status   Specimen Description BLOOD LEFT HAND  Final   Special Requests   Final    BOTTLES DRAWN AEROBIC AND ANAEROBIC Blood Culture adequate volume   Culture   Final    NO GROWTH 2 DAYS Performed at [facility], [address], [place], [place] [address]    Report Status PENDING  Incomplete    Radiology Studies: No results found.     Ziana Heyliger T. Cedrick Partain [facility]  If 7PM-7AM, please contact night-coverage [email or web address] 02/21/2024, 12:25 PM

## 2024-02-21 NOTE — Plan of Care (Signed)
  Problem: Nutrition: Goal: Adequate nutrition will be maintained Outcome: Progressing   Problem: Coping: Goal: Level of anxiety will decrease Outcome: Progressing   Problem: Elimination: Goal: Will not experience complications related to urinary retention Outcome: Progressing   Problem: Safety: Goal: Ability to remain free from injury will improve Outcome: Progressing   

## 2024-02-21 NOTE — TOC Progression Note (Addendum)
 Transition of Care [facility]) - Progression Note    Patient Details  Name: Alexandra Wolfe MRN: [id number] Date of Birth: [birth year]  [facility] CM/SW Contact  Nicanor Bake Phone Number: [phone] 02/21/2024, 12:48 PM  Clinical Narrative: HF CSW met with pt at bedside. Pt stated that she is ok with going to a SNF. Pt gave CSW permission to fax out. CSW explained the SNF process to the pt. Pt stated that she would prefer to be in or close to [place] close to where her son lives. CSW completed FL2 and faxed pt out. SNF offers pending.    TOC will continue following.            Expected Discharge Plan and Services                                               Social Determinants of Health (SDOH) Interventions SDOH Screenings   Food Insecurity: No Food Insecurity (02/19/2024)  Housing: Low Risk  (02/19/2024)  Transportation Needs: No Transportation Needs (02/19/2024)  Utilities: Not At Risk (02/19/2024)  Alcohol Screen: Low Risk  (06/25/2023)  Depression (PHQ2-9): Low Risk  (06/25/2023)  Recent Concern: Depression (PHQ2-9) - Medium Risk (04/01/2023)  Financial Resource Strain: Low Risk  (06/25/2023)  Physical Activity: Inactive (06/25/2023)  Social Connections: Socially Isolated (02/19/2024)  Stress: No Stress Concern Present (06/25/2023)  Tobacco Use: Medium Risk (02/19/2024)  Health Literacy: Adequate Health Literacy (06/25/2023)    Readmission Risk Interventions     No data to display

## 2024-02-21 NOTE — [place] FL2 (Signed)
 [place] MEDICAID FL2 LEVEL OF CARE FORM     IDENTIFICATION  Patient Name: Alexandra Wolfe Birthdate: Apr 06, 1946 Sex: female Admission Date (Current Location): 02/19/2024  [facility] and [place] Number:  Producer, television/film/video and Address:  [facility], [address] [place], [place] [address]      Provider Number: [id number]  Attending Physician Name and Address:  Almon Hercules, MD  Relative Name and Phone Number:  Betsy, Rosello [phone]    Current Level of Care: Hospital Recommended Level of Care: Skilled Nursing Facility Prior Approval Number:    Date Approved/Denied:   PASRR Number: [id number]  Discharge Plan: SNF    Current Diagnoses: Patient Active Problem List   Diagnosis Date Noted   UTI (urinary tract infection) 02/19/2024   Sepsis (HCC) 02/19/2024   Anxiety 11/26/2022   Chronic pain of both knees 12/15/2021   GERD (gastroesophageal reflux disease) 12/14/2021   Insomnia 12/14/2021   Morbid obesity (HCC) 01/13/2021   Recurrent falls 07/05/2020   Body mass index (BMI) 37.0-37.9, adult 04/10/2020   TBI (traumatic brain injury) (HCC) 04/10/2020   Neurocognitive deficits 03/30/2020   Acute subdural hematoma 02/29/2020   Syncope 02/29/2020   Mild renal insufficiency 02/29/2020   Essential hypertension 02/29/2020   Asthma 02/29/2020   Chronic dyspnea 01/04/2020   Allergic rhinitis 01/04/2020   External otitis of right ear 07/25/2019   Bilateral foot pain 07/01/2019   Pain in left knee 01/05/2019   Pain in right knee 01/05/2019   Diabetic neuropathy (HCC) 03/04/2018   Obstructive sleep apnea 04/04/2016   Peripheral edema 12/23/2015   Hypersomnolence 12/22/2015   Baker's cyst of knee 11/24/2013   Left knee pain 11/24/2013   Rash 05/26/2012   Bilateral shoulder pain 12/13/2011   Diabetes mellitus type II, non insulin dependent (HCC) 11/20/2011   Leukocytosis 11/20/2011   Preoperative clearance 11/20/2011   Eustachian tube  dysfunction 05/20/2011   Low back pain 05/20/2011   Encounter for well adult exam with abnormal findings 05/20/2011   Vertigo 05/20/2011   Hypothyroidism 02/15/2011   Dysphagia 02/15/2011   URINARY INCONTINENCE 08/23/2009   Seasonal and perennial allergic rhinitis 04/02/2009   Constipation 09/27/2008   Hyperlipidemia 03/11/2008   BUNIONS, BILATERAL 12/23/2007   Depression 09/13/2007   Degenerative arthritis of knee, bilateral 09/13/2007   History of colonic polyps 09/13/2007    Orientation RESPIRATION BLADDER Height & Weight     Self, Place, Situation, Time  Normal Continent Weight: 227 lb 1.2 oz (103 kg) Height:  5\' 6"  (167.6 cm)  BEHAVIORAL SYMPTOMS/MOOD NEUROLOGICAL BOWEL NUTRITION STATUS      Continent Diet (See dc summary)  AMBULATORY STATUS COMMUNICATION OF NEEDS Skin   Limited Assist Verbally PU Stage and Appropriate Care (Incision 12/13/11 Shoulder Left)                       Personal Care Assistance Level of Assistance  Bathing, Feeding, Dressing, Total care Bathing Assistance: Maximum assistance Feeding assistance: Limited assistance Dressing Assistance: Limited assistance Total Care Assistance: Limited assistance   Functional Limitations Info  Sight, Hearing, Speech Sight Info: Adequate Hearing Info: Adequate Speech Info: Adequate    SPECIAL CARE FACTORS FREQUENCY  PT (By licensed PT), OT (By licensed OT)     PT Frequency: 5x weekly OT Frequency: 5x weekly            Contractures      Additional Factors Info  Code Status, Allergies Code Status Info:  FULL Allergies Info: Aripiprazole;Simvastatin           Current Medications (02/21/2024):  This is the current hospital active medication list Current Facility-Administered Medications  Medication Dose Route Frequency Provider Last Rate Last Admin   acetaminophen (TYLENOL) tablet 650 mg  650 mg Oral Q6H PRN Garba, Mohammad L, MD       ACT Dry Mouth LOZG 1 each  1 each Mouth/Throat Q2H PRN  Gonfa, Taye T, MD       albuterol (PROVENTIL) (2.5 MG/3ML) 0.083% nebulizer solution 2.5 mg  2.5 mg Inhalation Q6H PRN Rometta Emery, MD       aspirin EC tablet 81 mg  81 mg Oral Daily Earlie Lou L, MD   81 mg at 02/21/24 6962   cefTRIAXone (ROCEPHIN) 2 g in sodium chloride 0.9 % 100 mL IVPB  2 g Intravenous Q24H Earlie Lou L, MD 200 mL/hr at 02/20/24 1730 2 g at 02/20/24 1730   diphenoxylate-atropine (LOMOTIL) 2.5-0.025 MG per tablet 1 tablet  1 tablet Oral QID PRN Rometta Emery, MD       enoxaparin (LOVENOX) injection 40 mg  40 mg Subcutaneous Q24H Earlie Lou L, MD   40 mg at 02/21/24 9528   fesoterodine (TOVIAZ) tablet 4 mg  4 mg Oral Daily Earlie Lou L, MD   4 mg at 02/21/24 0821   fluticasone (FLONASE) 50 MCG/ACT nasal spray 1 spray  1 spray Each Nare Daily Rometta Emery, MD   1 spray at 02/21/24 4132   gabapentin (NEURONTIN) capsule 100 mg  100 mg Oral TID Earlie Lou L, MD   100 mg at 02/21/24 0821   insulin aspart (novoLOG) injection 0-15 Units  0-15 Units Subcutaneous TID WC Rometta Emery, MD   2 Units at 02/21/24 4401   insulin aspart (novoLOG) injection 0-5 Units  0-5 Units Subcutaneous QHS Earlie Lou L, MD       levothyroxine (SYNTHROID) tablet 50 mcg  50 mcg Oral Q0600 Rometta Emery, MD   50 mcg at 02/21/24 0639   loratadine (CLARITIN) tablet 10 mg  10 mg Oral Daily Earlie Lou L, MD   10 mg at 02/21/24 0272   meclizine (ANTIVERT) tablet 25 mg  25 mg Oral TID PRN Rometta Emery, MD       metoprolol succinate (TOPROL-XL) 24 hr tablet 25 mg  25 mg Oral Daily Earlie Lou L, MD   25 mg at 02/21/24 5366   mometasone-formoterol (DULERA) 200-5 MCG/ACT inhaler 2 puff  2 puff Inhalation BID Rometta Emery, MD   2 puff at 02/20/24 1945   multivitamin with minerals tablet 1 tablet  1 tablet Oral Daily Rometta Emery, MD   1 tablet at 02/21/24 0821   ondansetron (ZOFRAN) tablet 4 mg  4 mg Oral Q6H PRN Rometta Emery, MD       Or    ondansetron (ZOFRAN) injection 4 mg  4 mg Intravenous Q6H PRN Rometta Emery, MD       pantoprazole (PROTONIX) EC tablet 40 mg  40 mg Oral Daily Earlie Lou L, MD   40 mg at 02/21/24 4403   polyethylene glycol (MIRALAX / GLYCOLAX) packet 17 g  17 g Oral BID PRN Rometta Emery, MD       senna-docusate (Senokot-S) tablet 1 tablet  1 tablet Oral QHS PRN Rometta Emery, MD         Discharge Medications: Please see discharge summary for a list  of discharge medications.  Relevant Imaging Results:  Relevant Lab Results:   Additional Information SSN: [id number]  Reva Bores, LCSWA

## 2024-02-22 DIAGNOSIS — A419 Sepsis, unspecified organism: Secondary | ICD-10-CM | POA: Diagnosis not present

## 2024-02-22 LAB — URINE CULTURE: Culture: >=100000 — AB

## 2024-02-22 LAB — GLUCOSE, CAPILLARY
Glucose-Capillary: 118 mg/dL — ABNORMAL HIGH (ref 70–99)
Glucose-Capillary: 137 mg/dL — ABNORMAL HIGH (ref 70–99)
Glucose-Capillary: 144 mg/dL — ABNORMAL HIGH (ref 70–99)
Glucose-Capillary: 148 mg/dL — ABNORMAL HIGH (ref 70–99)
Glucose-Capillary: 193 mg/dL — ABNORMAL HIGH (ref 70–99)

## 2024-02-22 MED ORDER — FLUTICASONE PROPIONATE 50 MCG/ACT NA SUSP
2.0000 | Freq: Every day | NASAL | Status: DC
  Administered 2024-02-22 – 2024-02-24 (×2): 2 via NASAL
  Filled 2024-02-22: qty 16

## 2024-02-22 MED ORDER — CEPHALEXIN 500 MG PO CAPS
500.0000 mg | ORAL_CAPSULE | Freq: Three times a day (TID) | ORAL | Status: DC
  Administered 2024-02-22 – 2024-02-24 (×6): 500 mg via ORAL
  Filled 2024-02-22 (×6): qty 1

## 2024-02-22 NOTE — Progress Notes (Signed)
 Mobility Specialist Progress Note:   02/22/24 1139  Mobility  Activity Transferred to/from [facility]  Level of Assistance Minimal assist, patient does 75% or more  Assistive Device Other (Comment) (HHA)  Distance Ambulated (ft) 3 ft  Activity Response Tolerated well  Mobility Referral Yes  Mobility visit 1 Mobility  Mobility Specialist Start Time (ACUTE ONLY) 1000  Mobility Specialist Stop Time (ACUTE ONLY) 1015  Mobility Specialist Time Calculation (min) (ACUTE ONLY) 15 min   Pt received on BSC agreeable to mobility. Pt required MinA w/ HHA to stand and take a couple steps towards the chair. NT assisted throughout session. No c/o. Left in chair w/ call bell and personal belongings in reach. All needs met. Chair alarm on.   Thompson Grayer Mobility Specialist  Please contact vis Secure Chat or  Rehab Office [phone]

## 2024-02-22 NOTE — Plan of Care (Signed)
   Problem: Education: Goal: Knowledge of General Education information will improve Description Including pain rating scale, medication(s)/side effects and non-pharmacologic comfort measures Outcome: Progressing

## 2024-02-22 NOTE — Progress Notes (Addendum)
 PROGRESS NOTE    Alexandra Wolfe  ZOX:[id number] DOB: 04-07-1946 DOA: 02/19/2024 PCP: Corwin Levins, MD     Brief Narrative:  78 year old F with PMH of DM-2 with neuropathy, morbid obesity, asthma, urinary incontinence, SDH/TBI/neurocognitive deficit, osteoarthritis, lumbar laminectomy, hypothyroidism, anxiety, depression and GERD presenting with recurrent falls, dysuria and suprapubic pain, and admitted with working diagnosis of UTI, generalized weakness and recurrent falls.  Patient reports dysuria for about a week and suprapubic tenderness and pain for over 2 weeks.  Chronic urinary incontinence.    In ED, stable vitals.  Afebrile.  CMP with mild hyperglycemia and mildly elevated AST.  T. bili 1.5.  WBC 12.7 with left shift.  CK5 147.  CXR and pelvic x-ray without acute finding.  CT head without acute finding.  Cervical, thoracic and lumbar spine CT with advanced degenerative changes, progressive multilevel lower thoracic spine and lumbar stenosis, at least moderate at T12-L1, moderate to severe at L1-L2 and L2-L3.  UA consistent with UTI.  Cultures obtained.  Started on IV fluid and IV ceftriaxone.    Blood cultures NGTD.  Urine culture with E. coli and Klebsiella.  Sensitivities on Klebsiella pending.  Therapy recommended SNF.   New events last 24 hours / Subjective: No acute events overnight.  Patient feeling well overall.  Does have some intermittent ear pain.  Flonase is on her chart but prescribed for 1 spray daily.  Also gets headaches which Tylenol is helpful for her.  She would like it scheduled as needed so nursing does not have to ask [place].  Assessment & Plan:   Sepsis due to urinary tract infection: -Pan-sensitive Klebsiella, transition to Keflex 500 mg 3 times daily for 4 more days -Sepsis 2/2 WBC and HR. These have normalized hence sepsis resolved as of today. Will cont to monitor. -BC's pending   Generalized weakness/recurrent falls: -Outpatient follow-up with neurosurgery  or orthopedic surgery -Fall precaution,  -PT/OT-recommended SNF. -Tylenol as needed for headaches  Ear Pain Change INCS to 2 sprays daily from once daily   NIDDM-2 with hyperglycemia and neuropathy:  -Continue SSI-moderate. -Continue home gabapentin.   Traumatic rhabdomyolysis:  -Monitor off IV fluid -Trend CK, improving -Continue holding Lipitor.  Would likely restart upon discharge.   Essential hypertension: -Continue home Toprol-XL 25 mg daily   History of asthma:   -Continue Dulera 200-5 mcg 2 puffs twice daily   Depression with anxiety:  -Continue home regimen   History of SDH/TBI/neurocognitive deficit: Awake and alert and oriented    Urinary incontinence:  -Continue home regimen of Toviaz 4 mg daily on hospital formulary   GERD:  -Continue with PPIs   Constipation -Bowel regimen.  Hypothyroidism -Levothyroxine 50 mcg daily   Morbid obesity: Elevated BMI with comorbidity as above. Body mass index is 36.65 kg/m. -Consider GLP-1 agonist -Encourage lifestyle change to lose weight   DVT prophylaxis: Lovenox Code Status: Full Family Communication: None at bedside Coming From: Home Disposition Plan: SNF Barriers to Discharge: SNF placement  Consultants:  None  Procedures:  None  Antimicrobials:  Anti-infectives (From admission, onward)    Start     Dose/Rate Route Frequency Ordered Stop   02/20/24 1900  cefTRIAXone (ROCEPHIN) 2 g in sodium chloride 0.9 % 100 mL IVPB        2 g 200 mL/hr over 30 Minutes Intravenous Every 24 hours 02/19/24 2210 02/26/24 1859   02/19/24 1930  cefTRIAXone (ROCEPHIN) 1 g in sodium chloride 0.9 % 100 mL IVPB  1 g 200 mL/hr over 30 Minutes Intravenous  Once 02/19/24 1920 02/19/24 2010        Objective: Vitals:   02/21/24 0900 02/21/24 1649 02/21/24 2003 02/22/24 0753  BP:  (!) 151/79 (!) 141/67 (!) 148/63  Pulse:  83 81 71  Resp:  17 19 18   Temp:   98.1 F (36.7 C) 97.8 F (36.6 C)  TempSrc:   Oral  Oral  SpO2:  95% 95% 97%  Weight: 103 kg     Height: 5\' 6"  (1.676 m)       Intake/Output Summary (Last 24 hours) at 02/22/2024 1444 Last data filed at 02/21/2024 2300 Gross per 24 hour  Intake --  Output 500 ml  Net -500 ml   Filed Weights   02/21/24 0900  Weight: 103 kg    Examination:  General exam: Appears calm and comfortable  Respiratory system: Clear to auscultation. Respiratory effort normal. No respiratory distress. No conversational dyspnea.  Cardiovascular system: S1 & S2 heard, RRR. No murmurs. No pedal edema. Gastrointestinal system: Abdomen is nondistended, soft and nontender. Normal bowel sounds heard. Central nervous system: Alert and oriented. No focal neurological deficits. Speech clear.  Extremities: Symmetric in appearance  Skin: No rashes, lesions or ulcers on exposed skin  Psychiatry: Judgement and insight appear normal. Mood & affect appropriate.   Data Reviewed: I have personally reviewed following labs and imaging studies  CBC: Recent Labs  Lab 02/19/24 1058 02/19/24 2254 02/20/24 0726 02/21/24 0623  WBC 12.7* 12.1* 9.1 7.3  NEUTROABS 9.6*  --   --   --   HGB 12.5 13.3 11.5* 11.2*  HCT 38.4 40.9 35.4* 34.7*  MCV 87.3 88.5 87.8 87.2  PLT 311 332 265 261   Basic Metabolic Panel: Recent Labs  Lab 02/19/24 1058 02/19/24 2254 02/20/24 0726 02/21/24 0623  NA 135  --  133* 136  K 4.2  --  3.9 3.7  CL 102  --  101 105  CO2 19*  --  22 24  GLUCOSE 148*  --  147* 150*  BUN 20  --  12 12  CREATININE 0.81 0.82 0.77 0.76  CALCIUM 9.0  --  8.3* 8.5*  MG  --   --   --  1.8  PHOS  --   --   --  4.2   GFR: Estimated Creatinine Clearance: 70.3 mL/min (by C-G formula based on SCr of 0.76 mg/dL).  Liver Function Tests: Recent Labs  Lab 02/19/24 1058 02/20/24 0726 02/21/24 0623  AST 54* 36  --   ALT 22 25  --   ALKPHOS 63 54  --   BILITOT 1.5* 0.9  --   PROT 6.3* 5.6*  --   ALBUMIN 3.5 2.9* 2.9*   Coagulation Profile: Recent Labs  Lab  02/20/24 0726  INR 1.2   Cardiac Enzymes: Recent Labs  Lab 02/19/24 1058 02/20/24 0726 02/21/24 0623  CKTOTAL 745* 444* 335*   HbA1C: Recent Labs    02/19/24 2254  HGBA1C 6.4*   CBG: Recent Labs  Lab 02/21/24 1648 02/21/24 2056 02/22/24 0023 02/22/24 0750 02/22/24 1212  GLUCAP 118* 135* 137* 148* 144*   Recent Results (from the past 240 hours)  Urine Culture     Status: Abnormal   Collection Time: 02/19/24  5:28 PM   Specimen: Urine, Random  Result Value Ref Range Status   Specimen Description URINE, RANDOM  Final   Special Requests   Final    URINE, CATHETERIZED Performed at  [facility], [address], [place], [place] [address]    Culture (A)  Final    >=100,000 COLONIES/mL ESCHERICHIA COLI 80,000 COLONIES/mL KLEBSIELLA AEROGENES    Report Status 02/22/2024 FINAL  Final   Organism ID, Bacteria ESCHERICHIA COLI (A)  Final   Organism ID, Bacteria KLEBSIELLA AEROGENES (A)  Final      Susceptibility   Klebsiella aerogenes - MIC*    CEFEPIME <=0.12 SENSITIVE Sensitive     CEFTRIAXONE <=0.25 SENSITIVE Sensitive     CIPROFLOXACIN <=0.25 SENSITIVE Sensitive     GENTAMICIN <=1 SENSITIVE Sensitive     IMIPENEM 1 SENSITIVE Sensitive     NITROFURANTOIN 128 RESISTANT Resistant     TRIMETH/SULFA <=20 SENSITIVE Sensitive     PIP/TAZO <=4 SENSITIVE Sensitive ug/mL    * 80,000 COLONIES/mL KLEBSIELLA AEROGENES   Escherichia coli - MIC*    AMPICILLIN <=2 SENSITIVE Sensitive     CEFAZOLIN <=4 SENSITIVE Sensitive     CEFEPIME <=0.12 SENSITIVE Sensitive     CEFTRIAXONE <=0.25 SENSITIVE Sensitive     CIPROFLOXACIN <=0.25 SENSITIVE Sensitive     GENTAMICIN <=1 SENSITIVE Sensitive     IMIPENEM <=0.25 SENSITIVE Sensitive     NITROFURANTOIN <=16 SENSITIVE Sensitive     TRIMETH/SULFA <=20 SENSITIVE Sensitive     AMPICILLIN/SULBACTAM <=2 SENSITIVE Sensitive     PIP/TAZO <=4 SENSITIVE Sensitive ug/mL    * >=100,000 COLONIES/mL ESCHERICHIA COLI  Culture, blood (Routine  X 2) w Reflex to ID Panel     Status: None (Preliminary result)   Collection Time: 02/19/24 10:54 PM   Specimen: BLOOD LEFT ARM  Result Value Ref Range Status   Specimen Description BLOOD LEFT ARM  Final   Special Requests   Final    BOTTLES DRAWN AEROBIC AND ANAEROBIC Blood Culture adequate volume   Culture   Final    NO GROWTH 3 DAYS Performed at [facility], [address], [place], [place] [address]    Report Status PENDING  Incomplete  Culture, blood (Routine X 2) w Reflex to ID Panel     Status: None (Preliminary result)   Collection Time: 02/19/24 10:57 PM   Specimen: BLOOD LEFT HAND  Result Value Ref Range Status   Specimen Description BLOOD LEFT HAND  Final   Special Requests   Final    BOTTLES DRAWN AEROBIC AND ANAEROBIC Blood Culture adequate volume   Culture   Final    NO GROWTH 3 DAYS Performed at [facility], [address], [place], [place] [address]    Report Status PENDING  Incomplete     Scheduled Meds:  aspirin EC  81 mg Oral Daily   enoxaparin (LOVENOX) injection  40 mg Subcutaneous Q24H   fesoterodine  4 mg Oral Daily   fluticasone  2 spray Each Nare Daily   gabapentin  100 mg Oral TID   insulin aspart  0-15 Units Subcutaneous TID WC   insulin aspart  0-5 Units Subcutaneous QHS   levothyroxine  50 mcg Oral Q0600   loratadine  10 mg Oral Daily   metoprolol succinate  25 mg Oral Daily   mometasone-formoterol  2 puff Inhalation BID   multivitamin with minerals  1 tablet Oral Daily   pantoprazole  40 mg Oral Daily   Continuous Infusions:  cefTRIAXone (ROCEPHIN)  IV 2 g (02/21/24 2230)     LOS: 3 days    Time spent: 35 minutes   Sharlene Dory, DO [facility] 02/22/2024,  2:44 PM   Available via Epic secure chat 7am-7pm After these hours, please refer to coverage provider listed on [email or web address]

## 2024-02-23 DIAGNOSIS — N3000 Acute cystitis without hematuria: Secondary | ICD-10-CM | POA: Diagnosis not present

## 2024-02-23 LAB — CBC
HCT: 37.4 % (ref 36.0–46.0)
Hemoglobin: 12.2 g/dL (ref 12.0–15.0)
MCH: 28.6 pg (ref 26.0–34.0)
MCHC: 32.6 g/dL (ref 30.0–36.0)
MCV: 87.6 fL (ref 80.0–100.0)
Platelets: 338 10*3/uL (ref 150–400)
RBC: 4.27 MIL/uL (ref 3.87–5.11)
RDW: 14.1 % (ref 11.5–15.5)
WBC: 9.7 10*3/uL (ref 4.0–10.5)
nRBC: 0 % (ref 0.0–0.2)

## 2024-02-23 LAB — BASIC METABOLIC PANEL
Anion gap: 10 (ref 5–15)
BUN: 14 mg/dL (ref 8–23)
CO2: 23 mmol/L (ref 22–32)
Calcium: 8.8 mg/dL — ABNORMAL LOW (ref 8.9–10.3)
Chloride: 103 mmol/L (ref 98–111)
Creatinine, Ser: 0.71 mg/dL (ref 0.44–1.00)
GFR, Estimated: >60 mL/min (ref >60)
Glucose, Bld: 159 mg/dL — ABNORMAL HIGH (ref 70–99)
Potassium: 3.8 mmol/L (ref 3.5–5.1)
Sodium: 136 mmol/L (ref 135–145)

## 2024-02-23 LAB — GLUCOSE, CAPILLARY
Glucose-Capillary: 147 mg/dL — ABNORMAL HIGH (ref 70–99)
Glucose-Capillary: 144 mg/dL — ABNORMAL HIGH (ref 70–99)
Glucose-Capillary: 111 mg/dL — ABNORMAL HIGH (ref 70–99)
Glucose-Capillary: 133 mg/dL — ABNORMAL HIGH (ref 70–99)
Glucose-Capillary: 152 mg/dL — ABNORMAL HIGH (ref 70–99)

## 2024-02-23 NOTE — Progress Notes (Signed)
 Physical Therapy Treatment Patient Details Name: Alexandra Wolfe MRN: [id number] DOB: 1946/11/07 Today's Date: 02/23/2024   History of Present Illness 78 y.o. female admitted 3/13 with falls and UTI. PMhx: hypothyroidism, DM, HTN, diabetic neuropathy, asthma, morbid obesity, TBI, GERD, chronic pain issues, anxiety with depression, HLD, urinary incontinence, vertigo.    PT Comments  Pt received in supine, agreeable to therapy session, with good participation in seated BLE exercises, transfer and gait training. From lower bed height pt needing modA to stand but unstable in standing and sat back down. From elevated bed surface, pt standing with minA lift/steadying assist and pt needing consistent minA for stability and occasional RW management for short household distance gait trial in her room, pt c/o fatigue limiting gait distance. Elevated RR/DOE while standing, improves with seated break. BP checked sitting/standing and stable throughout, SpO2 and HR WFL on RA. Pt remains a high fall risk, with posterior instability with transfers and with limited standing tolerance, she lives alone and is not currently safe to transfer or ambulate without consistent +1-2 physical assist.   If plan is discharge home, recommend the following: A lot of help with walking and/or transfers;A lot of help with bathing/dressing/bathroom;Assistance with cooking/housework;Assist for transportation   Can travel by private vehicle      (could probably do wheelchair Zenaida Niece)  Equipment Recommendations  None recommended by PT (TBD post-acute)    Recommendations for Other Services       Precautions / Restrictions Precautions Precautions: Fall Recall of Precautions/Restrictions: Impaired Restrictions Weight Bearing Restrictions Per Provider Order: No     Mobility  Bed Mobility Overal bed mobility: Needs Assistance Bed Mobility: Supine to Sit, Sit to Supine     Supine to sit: Min assist, HOB elevated, Used rails Sit  to supine: Contact guard assist, HOB elevated, Used rails   General bed mobility comments: Pt states her bed at home has a rail that clips under the mattress she can pull on.    Transfers Overall transfer level: Needs assistance Equipment used: Rolling walker (2 wheels) Transfers: Sit to/from Stand Sit to Stand: Min assist, Mod assist, From elevated surface           General transfer comment: Cues for use of momentum to achieve upright as she was unable to maintain upright on first trial from lower bed height with modA, posterior instability and sat back down. On second trial, bed elevated ~4" and pt standing with minA and cues for momentum and needing consistent minA as she tends to lift RW up off the floor and posterior instability observed. Cues to push down through RW and RW lowered one click to reduce posterior bias.    Ambulation/Gait Ambulation/Gait assistance: Min assist, +2 safety/equipment Gait Distance (Feet): 18 Feet Assistive device: Rolling walker (2 wheels) Gait Pattern/deviations: Trunk flexed, Decreased dorsiflexion - right, Decreased dorsiflexion - left, Step-to pattern       General Gait Details: Assist needed to control RW and balance with stepping/turning, +2 close chair follow, limited by c/o fatigue, DOE 2/4. Pt states "I'm always somewhat winded, SpO2 and HR WFL on RA. BP checked standing and stable. Pt reports >5/10 RPE post-exertion, encouraged her to do same distance again but she reports too fatigued.   Stairs             Wheelchair Mobility     Tilt Bed    Modified Rankin (Stroke Patients Only)       Balance Overall balance assessment: Needs assistance Sitting-balance support: Feet  supported, Single extremity supported Sitting balance-Leahy Scale: Good Sitting balance - Comments: seated BLE exercises no LOB, holding R rail end of bed   Standing balance support: Reliant on assistive device for balance Standing balance-Leahy Scale:  Poor Standing balance comment: RW and physical assist in standing                            Communication Communication Communication: No apparent difficulties  Cognition Arousal: Alert Behavior During Therapy: WFL for tasks assessed/performed   PT - Cognitive impairments: Safety/Judgement, Problem solving                       PT - Cognition Comments: posterior bias with max cues to push down on RW for mobility, sequential cues for transfers. Pt A&O to month/year, situation, location, but states "I don't have internet or a smart phone" when prompted (pt asking about exercises when home and PTA asked about computer re: Medbridge videos) but pt has an iphone on bedside table. Following commands: Intact      Cueing Cueing Techniques: Verbal cues, Gestural cues, Tactile cues  Exercises Other Exercises Other Exercises: seated BLE AROM: ankle pumps, hip flexion, LAQ x10 reps ea Other Exercises: STS x 4 reps with rest breaks between (using hands to push)    General Comments General comments (skin integrity, edema, etc.): Pt c/o feeling cold, her room is set to 64 degrees, thermostat increased to 70 degrees at pt request      Pertinent Vitals/Pain Pain Assessment Pain Assessment: No/denies pain Pain Intervention(s): Monitored during session    Home Living                          Prior Function            PT Goals (current goals can now be found in the care plan section) Acute Rehab PT Goals Patient Stated Goal: To walk without falling PT Goal Formulation: With patient Time For Goal Achievement: 03/05/24 Progress towards PT goals: Progressing toward goals    Frequency    Min 2X/week      PT Plan      Co-evaluation              AM-PAC PT "6 Clicks" Mobility   Outcome Measure  Help needed turning from your back to your side while in a flat bed without using bedrails?: A Little Help needed moving from lying on your back to  sitting on the side of a flat bed without using bedrails?: A Lot (flat bed/no rail) Help needed moving to and from a bed to a chair (including a wheelchair)?: A Lot Help needed standing up from a chair using your arms (e.g., wheelchair or bedside chair)?: A Lot Help needed to walk in hospital room?: A Lot (chair follow) Help needed climbing 3-5 steps with a railing? : Total 6 Click Score: 12    End of Session Equipment Utilized During Treatment: Gait belt Activity Tolerance: Patient tolerated treatment well;Patient limited by fatigue Patient left: in bed;with call bell/phone within reach;with bed alarm set;Other (comment) (HOB >30* so pt can drink water, pillows to float heels and offload R lower back/hip) Nurse Communication: Mobility status;Other (comment) (needs new purewick, hers was old) PT Visit Diagnosis: Other abnormalities of gait and mobility (R26.89);Difficulty in walking, not elsewhere classified (R26.2);History of falling (Z91.81);Muscle weakness (generalized) (M62.81)     Time: 1530-1601 PT  Time Calculation (min) (ACUTE ONLY): 31 min  Charges:    $Gait Training: 8-22 mins $Therapeutic Exercise: 8-22 mins PT General Charges $$ ACUTE PT VISIT: 1 Visit                     Gelsey Amyx P., PTA Acute Rehabilitation Services Secure Chat Preferred 9a-5:30pm Office: [phone]    Dorathy Kinsman [facility] 02/23/2024, 4:20 PM

## 2024-02-23 NOTE — Progress Notes (Addendum)
 PROGRESS NOTE    Alexandra Wolfe  WUJ:[id number] DOB: 08/30/1946 DOA: 02/19/2024 PCP: Corwin Levins, MD     Brief Narrative:  78 year old woman with PMH of T2DM with neuropathy, morbid obesity, asthma, urinary incontinence, SDH/TBI/neurocognitive deficit, osteoarthritis, lumbar laminectomy, hypothyroidism, anxiety, depression and GERD presenting with recurrent falls, dysuria and suprapubic pain, and admitted with working diagnosis of UTI, generalized weakness and recurrent falls.  Patient reports dysuria for about a week and suprapubic tenderness and pain for over 2 weeks.  Chronic urinary incontinence.    In ED, stable vitals.  Afebrile.  CMP with mild hyperglycemia and mildly elevated AST.  T. bili 1.5.  WBC 12.7 with left shift.  CK5 147.  CXR and pelvic x-ray without acute finding.  CT head without acute finding.  Cervical, thoracic and lumbar spine CT with advanced degenerative changes, progressive multilevel lower thoracic spine and lumbar stenosis, at least moderate at T12-L1, moderate to severe at L1-L2 and L2-L3.  UA consistent with UTI.  Cultures obtained.  Started on IV fluid and IV ceftriaxone.    Blood cultures NGTD.  Urine culture with E. coli and Klebsiella.  Therapy recommended SNF.   New events last 24 hours / Subjective: Patient states she is feeling well today.  She is anxious to discharge to rehab.  Assessment & Plan:   Urinary tract infection 2/2 Klebsiella and E. coli. Patient did not meet clinical sepsis criteria. Patient was initially started on ceftriaxone.  Transitioned to Keflex 500 mg 3 times daily with end date 3/20. Blood cultures negative. -Continue Keflex until 3/20.   Generalized weakness/recurrent falls: -Outpatient follow-up with neurosurgery or orthopedic surgery -Fall precaution,  -PT/OT-recommended SNF. -Tylenol as needed for headaches  Ear Pain Continue tylenol   NIDDM-2 with hyperglycemia and neuropathy:  -Continue SSI-moderate. -Continue  home gabapentin.   Traumatic rhabdomyolysis:  S/p IV fluids. CK has trended down.  -Continue holding Lipitor.  Would likely restart upon discharge.   Essential hypertension: -Continue home Toprol-XL 25 mg daily   History of asthma:   -Continue Dulera 200-5 mcg 2 puffs twice daily   Depression with anxiety:  -Continue home regimen   History of SDH/TBI/neurocognitive deficit: Awake and alert and oriented    Urinary incontinence:  -Continue home regimen of Toviaz 4 mg daily on hospital formulary   GERD:  -Continue with PPIs   Constipation -Bowel regimen.  Hypothyroidism -Levothyroxine 50 mcg daily   Morbid obesity: Elevated BMI with comorbidity as above. Body mass index is 36.65 kg/m. -Consider GLP-1 agonist -Encourage lifestyle change to lose weight   DVT prophylaxis: Lovenox Code Status: Full Family Communication: None at bedside Coming From: Home Disposition Plan: SNF Barriers to Discharge: SNF placement  Consultants:  None  Procedures:  None  Antimicrobials:  Anti-infectives (From admission, onward)    Start     Dose/Rate Route Frequency Ordered Stop   02/22/24 1545  cephALEXin (KEFLEX) capsule 500 mg        500 mg Oral Every 8 hours 02/22/24 1446 02/26/24 1359   02/20/24 1900  cefTRIAXone (ROCEPHIN) 2 g in sodium chloride 0.9 % 100 mL IVPB  Status:  Discontinued        2 g 200 mL/hr over 30 Minutes Intravenous Every 24 hours 02/19/24 2210 02/22/24 1446   02/19/24 1930  cefTRIAXone (ROCEPHIN) 1 g in sodium chloride 0.9 % 100 mL IVPB        1 g 200 mL/hr over 30 Minutes Intravenous  Once 02/19/24 1920 02/19/24 2010  Objective: Vitals:   02/22/24 2005 02/23/24 0458 02/23/24 0730 02/23/24 0834  BP: 138/60 (!) 153/75 (!) 159/74   Pulse: 82 78 76   Resp: 18 17 18    Temp: 98 F (36.7 C) 97.8 F (36.6 C) 97.8 F (36.6 C)   TempSrc: Oral Oral Oral   SpO2: 98% 94% 97% 97%  Weight:      Height:        Intake/Output Summary (Last 24  hours) at 02/23/2024 1407 Last data filed at 02/23/2024 0612 Gross per 24 hour  Intake --  Output 1225 ml  Net -1225 ml   Filed Weights   02/21/24 0900  Weight: 103 kg    Examination:  General exam: Appears calm and comfortable  Respiratory system: Clear to auscultation. Respiratory effort normal. No respiratory distress. No conversational dyspnea.  Cardiovascular system: S1 & S2 heard, RRR. No murmurs. No pedal edema. Gastrointestinal system: Abdomen is nondistended, soft and nontender. Normal bowel sounds heard. Central nervous system: Alert and oriented. No focal neurological deficits. Speech clear.  Extremities: Symmetric in appearance  Skin: No rashes, lesions or ulcers on exposed skin  Psychiatry: Judgement and insight appear normal. Mood & affect appropriate.   Data Reviewed: I have personally reviewed following labs and imaging studies  CBC: Recent Labs  Lab 02/19/24 1058 02/19/24 2254 02/20/24 0726 02/21/24 0623 02/23/24 0608  WBC 12.7* 12.1* 9.1 7.3 9.7  NEUTROABS 9.6*  --   --   --   --   HGB 12.5 13.3 11.5* 11.2* 12.2  HCT 38.4 40.9 35.4* 34.7* 37.4  MCV 87.3 88.5 87.8 87.2 87.6  PLT 311 332 265 261 338   Basic Metabolic Panel: Recent Labs  Lab 02/19/24 1058 02/19/24 2254 02/20/24 0726 02/21/24 0623 02/23/24 0608  NA 135  --  133* 136 136  K 4.2  --  3.9 3.7 3.8  CL 102  --  101 105 103  CO2 19*  --  22 24 23   GLUCOSE 148*  --  147* 150* 159*  BUN 20  --  12 12 14   CREATININE 0.81 0.82 0.77 0.76 0.71  CALCIUM 9.0  --  8.3* 8.5* 8.8*  MG  --   --   --  1.8  --   PHOS  --   --   --  4.2  --    GFR: Estimated Creatinine Clearance: 70.3 mL/min (by C-G formula based on SCr of 0.71 mg/dL).  Liver Function Tests: Recent Labs  Lab 02/19/24 1058 02/20/24 0726 02/21/24 0623  AST 54* 36  --   ALT 22 25  --   ALKPHOS 63 54  --   BILITOT 1.5* 0.9  --   PROT 6.3* 5.6*  --   ALBUMIN 3.5 2.9* 2.9*   Coagulation Profile: Recent Labs  Lab  02/20/24 0726  INR 1.2   Cardiac Enzymes: Recent Labs  Lab 02/19/24 1058 02/20/24 0726 02/21/24 0623  CKTOTAL 745* 444* 335*   HbA1C: No results for input(s): "HGBA1C" in the last 72 hours.  CBG: Recent Labs  Lab 02/22/24 1653 02/22/24 2009 02/23/24 0442 02/23/24 0745 02/23/24 1209  GLUCAP 118* 193* 147* 144* 111*   Recent Results (from the past 240 hours)  Urine Culture     Status: Abnormal   Collection Time: 02/19/24  5:28 PM   Specimen: Urine, Random  Result Value Ref Range Status   Specimen Description URINE, RANDOM  Final   Special Requests   Final    URINE, CATHETERIZED  Performed at [facility], [address], [place], [place] [address]    Culture (A)  Final    >=100,000 COLONIES/mL ESCHERICHIA COLI 80,000 COLONIES/mL KLEBSIELLA AEROGENES    Report Status 02/22/2024 FINAL  Final   Organism ID, Bacteria ESCHERICHIA COLI (A)  Final   Organism ID, Bacteria KLEBSIELLA AEROGENES (A)  Final      Susceptibility   Klebsiella aerogenes - MIC*    CEFEPIME <=0.12 SENSITIVE Sensitive     CEFTRIAXONE <=0.25 SENSITIVE Sensitive     CIPROFLOXACIN <=0.25 SENSITIVE Sensitive     GENTAMICIN <=1 SENSITIVE Sensitive     IMIPENEM 1 SENSITIVE Sensitive     NITROFURANTOIN 128 RESISTANT Resistant     TRIMETH/SULFA <=20 SENSITIVE Sensitive     PIP/TAZO <=4 SENSITIVE Sensitive ug/mL    * 80,000 COLONIES/mL KLEBSIELLA AEROGENES   Escherichia coli - MIC*    AMPICILLIN <=2 SENSITIVE Sensitive     CEFAZOLIN <=4 SENSITIVE Sensitive     CEFEPIME <=0.12 SENSITIVE Sensitive     CEFTRIAXONE <=0.25 SENSITIVE Sensitive     CIPROFLOXACIN <=0.25 SENSITIVE Sensitive     GENTAMICIN <=1 SENSITIVE Sensitive     IMIPENEM <=0.25 SENSITIVE Sensitive     NITROFURANTOIN <=16 SENSITIVE Sensitive     TRIMETH/SULFA <=20 SENSITIVE Sensitive     AMPICILLIN/SULBACTAM <=2 SENSITIVE Sensitive     PIP/TAZO <=4 SENSITIVE Sensitive ug/mL    * >=100,000 COLONIES/mL ESCHERICHIA COLI  Culture,  blood (Routine X 2) w Reflex to ID Panel     Status: None (Preliminary result)   Collection Time: 02/19/24 10:54 PM   Specimen: BLOOD LEFT ARM  Result Value Ref Range Status   Specimen Description BLOOD LEFT ARM  Final   Special Requests   Final    BOTTLES DRAWN AEROBIC AND ANAEROBIC Blood Culture adequate volume   Culture   Final    NO GROWTH 4 DAYS Performed at [facility], [address] [place], [place] [address]    Report Status PENDING  Incomplete  Culture, blood (Routine X 2) w Reflex to ID Panel     Status: None (Preliminary result)   Collection Time: 02/19/24 10:57 PM   Specimen: BLOOD LEFT HAND  Result Value Ref Range Status   Specimen Description BLOOD LEFT HAND  Final   Special Requests   Final    BOTTLES DRAWN AEROBIC AND ANAEROBIC Blood Culture adequate volume   Culture   Final    NO GROWTH 4 DAYS Performed at [facility], [address], [place], [place] [address]    Report Status PENDING  Incomplete     Scheduled Meds:  aspirin EC  81 mg Oral Daily   cephALEXin  500 mg Oral Q8H   enoxaparin (LOVENOX) injection  40 mg Subcutaneous Q24H   fesoterodine  4 mg Oral Daily   fluticasone  2 spray Each Nare Daily   gabapentin  100 mg Oral TID   insulin aspart  0-15 Units Subcutaneous TID WC   insulin aspart  0-5 Units Subcutaneous QHS   levothyroxine  50 mcg Oral Q0600   loratadine  10 mg Oral Daily   metoprolol succinate  25 mg Oral Daily   mometasone-formoterol  2 puff Inhalation BID   multivitamin with minerals  1 tablet Oral Daily   pantoprazole  40 mg Oral Daily   Continuous Infusions:     LOS: 4 days    Time spent: 35 minutes  MDALA-GAUSI, Gwenette Greet, MD \ [facility]  02/23/2024, 2:07 PM   Available via Epic secure chat 7am-7pm After these hours, please refer to coverage provider listed on [email or web address]

## 2024-02-23 NOTE — Plan of Care (Signed)

## 2024-02-23 NOTE — Care Management Important Message (Signed)
 Important Message  Patient Details  Name: Alexandra Wolfe MRN: [id number] Date of Birth: 1946/01/27   Important Message Given:  Yes - Medicare IM     Dorena Bodo 02/23/2024, 2:12 PM

## 2024-02-23 NOTE — TOC Progression Note (Addendum)
 Transition of Care [facility]) - Progression Note    Patient Details  Name: Alexandra Wolfe MRN: [id number] Date of Birth: 1946-08-09  [facility] of [facility] CM/SW Contact  Twanda Stakes A [place], LCSW Phone Number: 02/23/2024, 1:48 PM  Clinical Narrative:     Update 1632 CSW started insurance request to [facility].  Status pending: Auth ID: [id number]   TOC will continue to follow.   CSW met with pt at bedside to provide bed offers with Medicare.gov ratings. She stated that she would choose [facility], second [facility] if there was not a bed available. CSW to reach out to [facility] today regarding bed availability. CSW to start insurance authorization as pt is medically stable for DC.      TOC will continue to follow.        Expected Discharge Plan and Services                                               Social Determinants of Health (SDOH) Interventions SDOH Screenings   Food Insecurity: No Food Insecurity (02/19/2024)  Housing: Low Risk  (02/19/2024)  Transportation Needs: No Transportation Needs (02/19/2024)  Utilities: Not At Risk (02/19/2024)  Alcohol Screen: Low Risk  (06/25/2023)  Depression (PHQ2-9): Low Risk  (06/25/2023)  Recent Concern: Depression (PHQ2-9) - Medium Risk (04/01/2023)  Financial Resource Strain: Low Risk  (06/25/2023)  Physical Activity: Inactive (06/25/2023)  Social Connections: Socially Isolated (02/19/2024)  Stress: No Stress Concern Present (06/25/2023)  Tobacco Use: Medium Risk (02/19/2024)  Health Literacy: Adequate Health Literacy (06/25/2023)    Readmission Risk Interventions     No data to display

## 2024-02-23 NOTE — Progress Notes (Signed)
 Mobility Specialist: Progress Note   02/23/24 1033  Mobility  Activity Ambulated with assistance in room  Level of Assistance Contact guard assist, steadying assist  Assistive Device Front wheel walker  Distance Ambulated (ft) 40 ft  Activity Response Tolerated well  Mobility Referral Yes  Mobility visit 1 Mobility  Mobility Specialist Start Time (ACUTE ONLY) 0914  Mobility Specialist Stop Time (ACUTE ONLY) [id number]  Mobility Specialist Time Calculation (min) (ACUTE ONLY) 14 min    Pt was agreeable to mobility session - received in bed. SV for bed mobility, MinA for STS, minG for ambulation. Breathing heavily throughout mobility; denies any pain, but states she feels SOB. Took 1x standing pause to catch her breath. Ambulated to the door and around the bed to the chair. C/o fatigue once in chair. Left in chair with all needs met, call bell in reach. Chair alarm on.   Maurene Capes Mobility Specialist Please contact via SecureChat or Rehab office at [phone]

## 2024-02-23 NOTE — Plan of Care (Signed)
  Problem: Activity: Goal: Risk for activity intolerance will decrease Outcome: Not Progressing   Problem: Safety: Goal: Ability to remain free from injury will improve Outcome: Not Progressing   Problem: Elimination: Goal: Will not experience complications related to urinary retention Outcome: Not Progressing

## 2024-02-24 DIAGNOSIS — J45998 Other asthma: Secondary | ICD-10-CM | POA: Diagnosis not present

## 2024-02-24 DIAGNOSIS — F339 Major depressive disorder, recurrent, unspecified: Secondary | ICD-10-CM | POA: Diagnosis not present

## 2024-02-24 DIAGNOSIS — K219 Gastro-esophageal reflux disease without esophagitis: Secondary | ICD-10-CM | POA: Diagnosis not present

## 2024-02-24 DIAGNOSIS — R300 Dysuria: Secondary | ICD-10-CM | POA: Diagnosis not present

## 2024-02-24 DIAGNOSIS — Z7401 Bed confinement status: Secondary | ICD-10-CM | POA: Diagnosis not present

## 2024-02-24 DIAGNOSIS — E1140 Type 2 diabetes mellitus with diabetic neuropathy, unspecified: Secondary | ICD-10-CM | POA: Diagnosis not present

## 2024-02-24 DIAGNOSIS — M129 Arthropathy, unspecified: Secondary | ICD-10-CM | POA: Diagnosis not present

## 2024-02-24 DIAGNOSIS — N3281 Overactive bladder: Secondary | ICD-10-CM | POA: Diagnosis not present

## 2024-02-24 DIAGNOSIS — K5900 Constipation, unspecified: Secondary | ICD-10-CM | POA: Diagnosis not present

## 2024-02-24 DIAGNOSIS — G8929 Other chronic pain: Secondary | ICD-10-CM | POA: Diagnosis not present

## 2024-02-24 DIAGNOSIS — M79601 Pain in right arm: Secondary | ICD-10-CM | POA: Diagnosis not present

## 2024-02-24 DIAGNOSIS — R0600 Dyspnea, unspecified: Secondary | ICD-10-CM | POA: Diagnosis not present

## 2024-02-24 DIAGNOSIS — E119 Type 2 diabetes mellitus without complications: Secondary | ICD-10-CM | POA: Diagnosis not present

## 2024-02-24 DIAGNOSIS — G4733 Obstructive sleep apnea (adult) (pediatric): Secondary | ICD-10-CM | POA: Diagnosis not present

## 2024-02-24 DIAGNOSIS — N3000 Acute cystitis without hematuria: Secondary | ICD-10-CM | POA: Diagnosis not present

## 2024-02-24 DIAGNOSIS — R296 Repeated falls: Secondary | ICD-10-CM | POA: Diagnosis not present

## 2024-02-24 DIAGNOSIS — M6281 Muscle weakness (generalized): Secondary | ICD-10-CM | POA: Diagnosis not present

## 2024-02-24 DIAGNOSIS — R4189 Other symptoms and signs involving cognitive functions and awareness: Secondary | ICD-10-CM | POA: Diagnosis not present

## 2024-02-24 DIAGNOSIS — Z136 Encounter for screening for cardiovascular disorders: Secondary | ICD-10-CM | POA: Diagnosis not present

## 2024-02-24 DIAGNOSIS — M4805 Spinal stenosis, thoracolumbar region: Secondary | ICD-10-CM | POA: Diagnosis not present

## 2024-02-24 DIAGNOSIS — R531 Weakness: Secondary | ICD-10-CM | POA: Diagnosis not present

## 2024-02-24 DIAGNOSIS — E8809 Other disorders of plasma-protein metabolism, not elsewhere classified: Secondary | ICD-10-CM | POA: Diagnosis not present

## 2024-02-24 DIAGNOSIS — J309 Allergic rhinitis, unspecified: Secondary | ICD-10-CM | POA: Diagnosis not present

## 2024-02-24 DIAGNOSIS — E785 Hyperlipidemia, unspecified: Secondary | ICD-10-CM | POA: Diagnosis not present

## 2024-02-24 DIAGNOSIS — N390 Urinary tract infection, site not specified: Secondary | ICD-10-CM | POA: Diagnosis not present

## 2024-02-24 DIAGNOSIS — S069XAS Unspecified intracranial injury with loss of consciousness status unknown, sequela: Secondary | ICD-10-CM | POA: Diagnosis not present

## 2024-02-24 DIAGNOSIS — E569 Vitamin deficiency, unspecified: Secondary | ICD-10-CM | POA: Diagnosis not present

## 2024-02-24 DIAGNOSIS — I10 Essential (primary) hypertension: Secondary | ICD-10-CM | POA: Diagnosis not present

## 2024-02-24 DIAGNOSIS — F419 Anxiety disorder, unspecified: Secondary | ICD-10-CM | POA: Diagnosis not present

## 2024-02-24 DIAGNOSIS — R062 Wheezing: Secondary | ICD-10-CM | POA: Diagnosis not present

## 2024-02-24 DIAGNOSIS — E039 Hypothyroidism, unspecified: Secondary | ICD-10-CM | POA: Diagnosis not present

## 2024-02-24 DIAGNOSIS — R41841 Cognitive communication deficit: Secondary | ICD-10-CM | POA: Diagnosis not present

## 2024-02-24 DIAGNOSIS — G4700 Insomnia, unspecified: Secondary | ICD-10-CM | POA: Diagnosis not present

## 2024-02-24 DIAGNOSIS — W19XXXA Unspecified fall, initial encounter: Secondary | ICD-10-CM | POA: Diagnosis not present

## 2024-02-24 DIAGNOSIS — R197 Diarrhea, unspecified: Secondary | ICD-10-CM | POA: Diagnosis not present

## 2024-02-24 LAB — BASIC METABOLIC PANEL
Anion gap: 10 (ref 5–15)
BUN: 16 mg/dL (ref 8–23)
CO2: 22 mmol/L (ref 22–32)
Calcium: 8.8 mg/dL — ABNORMAL LOW (ref 8.9–10.3)
Chloride: 107 mmol/L (ref 98–111)
Creatinine, Ser: 0.74 mg/dL (ref 0.44–1.00)
GFR, Estimated: >60 mL/min (ref >60)
Glucose, Bld: 144 mg/dL — ABNORMAL HIGH (ref 70–99)
Potassium: 4 mmol/L (ref 3.5–5.1)
Sodium: 139 mmol/L (ref 135–145)

## 2024-02-24 LAB — CBC
HCT: 37 % (ref 36.0–46.0)
Hemoglobin: 11.9 g/dL — ABNORMAL LOW (ref 12.0–15.0)
MCH: 28.3 pg (ref 26.0–34.0)
MCHC: 32.2 g/dL (ref 30.0–36.0)
MCV: 87.9 fL (ref 80.0–100.0)
Platelets: 347 10*3/uL (ref 150–400)
RBC: 4.21 MIL/uL (ref 3.87–5.11)
RDW: 14.4 % (ref 11.5–15.5)
WBC: 10.1 10*3/uL (ref 4.0–10.5)
nRBC: 0 % (ref 0.0–0.2)

## 2024-02-24 LAB — CULTURE, BLOOD (ROUTINE X 2)
Culture: NO GROWTH
Culture: NO GROWTH
Special Requests: ADEQUATE
Special Requests: ADEQUATE

## 2024-02-24 LAB — GLUCOSE, CAPILLARY
Glucose-Capillary: 156 mg/dL — ABNORMAL HIGH (ref 70–99)
Glucose-Capillary: 161 mg/dL — ABNORMAL HIGH (ref 70–99)
Glucose-Capillary: 101 mg/dL — ABNORMAL HIGH (ref 70–99)

## 2024-02-24 MED ORDER — CEPHALEXIN 500 MG PO CAPS: 500.0000 mg | ORAL_CAPSULE | Freq: Three times a day (TID) | ORAL | Status: AC

## 2024-02-24 NOTE — Plan of Care (Signed)

## 2024-02-24 NOTE — Discharge Summary (Signed)
 Physician Discharge Summary   Patient: Alexandra Wolfe MRN: [id number] DOB: 1946/02/11  Admit date:     02/19/2024  Discharge date: 02/24/24  Discharge Physician: MDALA-GAUSI, Gwenette Greet   PCP: Corwin Levins, MD   Recommendations at discharge:    Follow up with Orthopedic surgery  Discharge Diagnoses: Principal Problem:   Sepsis (HCC) Active Problems:   Hyperlipidemia   Depression   URINARY INCONTINENCE   Hypothyroidism   Diabetes mellitus type II, non insulin dependent (HCC)   Diabetic neuropathy (HCC)   Essential hypertension   Asthma   Neurocognitive deficits   Morbid obesity (HCC)   TBI (traumatic brain injury) (HCC)   GERD (gastroesophageal reflux disease)   Chronic pain of both knees   Anxiety   UTI (urinary tract infection)  Resolved Problems:   * No resolved hospital problems. *  Hospital Course: 78 year old woman with PMH of T2DM with neuropathy, morbid obesity, asthma, urinary incontinence, SDH/TBI/neurocognitive deficit, osteoarthritis, lumbar laminectomy, hypothyroidism, anxiety, depression and GERD presenting with recurrent falls, dysuria and suprapubic pain, and admitted with working diagnosis of UTI, generalized weakness and recurrent falls.  Patient reported dysuria for about a week and suprapubic tenderness and pain for over 2 weeks.  Chronic urinary incontinence.    In ED, stable vitals.  Afebrile.  CMP with mild hyperglycemia and mildly elevated AST.  T. bili 1.5.  WBC 12.7 with left shift.  CK 745.  CXR and pelvic x-ray without acute finding.  CT head without acute finding.  Cervical, thoracic and lumbar spine CT with advanced degenerative changes, progressive multilevel lower thoracic spine and lumbar stenosis, at least moderate at T12-L1, moderate to severe at L1-L2 and L2-L3.  UA consistent with UTI.  Cultures obtained.  Started on IV fluid and IV ceftriaxone.    Blood cultures were negative.  Urine cultures grew E. coli and Klebsiella.  Antibiotics  de-escalated to Keflex. Patient seen by PT.  SNF recommended.    The hospital course is in problem-based format below:  Urinary tract infection 2/2 Klebsiella and E. coli. Patient did not meet clinical sepsis criteria. Patient was initially started on ceftriaxone.  Transitioned to Keflex 500 mg 3 times daily with end date 3/20. Blood cultures negative.   Generalized weakness/recurrent falls: Noted to have spinal stenosis. Seen by PT and SNF recommended. Ambulatory referral to orthopedics placed at discharge.  Ear Pain Treated with Tylenol   T2DM with hyperglycemia and neuropathy:  Home gabapentin, semaglutide and metformin resumed.   Traumatic rhabdomyolysis:  Mild rhabdomyolysis. Presented with CK of 745. Patient received IV fluids and CK trended down. Statin was resumed at discharge.    Essential hypertension: Home metoprolol was continued.   History of asthma:   Home Dulera was continued.   Depression with anxiety:  Home trazodone was continued.   Urinary incontinence:  Home medications resumed.   GERD:  Continued with PPI.   Constipation Bowel regimen ordered.   Hypothyroidism Home Synthroid was continued.   Morbid obesity: Elevated BMI with comorbidity as above. Body mass index is 35.05 kg/m.  Home semaglutide resumed at discharge.       Consultants: n/a Procedures performed: n/a  Disposition: Skilled nursing facility Diet recommendation:  Discharge Diet Orders (From admission, onward)     Start     Ordered   02/24/24 0000  Diet - low sodium heart healthy , diabetic diet   02/24/24 1048           Cardiac diet DISCHARGE MEDICATION: Allergies as of  02/24/2024       Reactions   Aripiprazole Other (See Comments)   REACTION: agitation, patient does not recognize   Simvastatin Other (See Comments)   REACTION: myalgia,  patient does not recognize        Medication List     STOP taking these medications    fluticasone 50 MCG/ACT  nasal spray Commonly known as: FLONASE   meclizine 25 MG tablet Commonly known as: ANTIVERT   ondansetron 4 MG disintegrating tablet Commonly known as: ZOFRAN-ODT   oxybutynin 10 MG 24 hr tablet Commonly known as: DITROPAN-XL       TAKE these medications    acetaminophen 325 MG tablet Commonly known as: TYLENOL Take 2 tablets (650 mg total) by mouth every 6 (six) hours as needed for mild pain or headache.   albuterol 108 (90 Base) MCG/ACT inhaler Commonly known as: VENTOLIN HFA INHALE 2 PUFFS INTO THE LUNGS EVERY 6 HOURS AS NEEDED FOR WHEEZING OR SHORTNESS OF BREATH   aspirin EC 81 MG tablet Take 81 mg by mouth daily.   atorvastatin 80 MG tablet Commonly known as: LIPITOR TAKE 1 TABLET DAILY   budesonide-formoterol 160-4.5 MCG/ACT inhaler Commonly known as: Symbicort Inhale 2 puffs into the lungs 2 (two) times daily.   CALCIUM 500 PO Take 1,000 mg by mouth 2 (two) times daily.   cephALEXin 500 MG capsule Commonly known as: KEFLEX Take 1 capsule (500 mg total) by mouth every 8 (eight) hours for 2 days.   cetirizine 10 MG tablet Commonly known as: ZYRTEC TAKE 1 TABLET DAILY What changed:  when to take this reasons to take this   citalopram 40 MG tablet Commonly known as: CELEXA TAKE 1 TABLET BY MOUTH EVERY DAY   diclofenac Sodium 1 % Gel Commonly known as: Voltaren Apply 4 g topically 4 (four) times daily. Apply topically to affected area qid What changed:  when to take this reasons to take this additional instructions   diphenoxylate-atropine 2.5-0.025 MG tablet Commonly known as: Lomotil Take 1 tablet by mouth 4 (four) times daily as needed for diarrhea or loose stools. What changed: when to take this   ergocalciferol 1.25 MG (50000 UT) capsule Commonly known as: VITAMIN D2 Take 1 tablet by mouth daily.   gabapentin 100 MG capsule Commonly known as: NEURONTIN TAKE 1 CAPSULE (100 MG TOTAL) BY MOUTH 3 TIMES A DAY What changed: See the new  instructions.   Gemtesa 75 MG Tabs Generic drug: Vibegron Take 1 tablet by mouth daily.   levothyroxine 50 MCG tablet Commonly known as: SYNTHROID TAKE 1 TABLET(50 MCG) BY MOUTH DAILY. NEEDS APPOINTMENT What changed: See the new instructions.   metFORMIN 500 MG tablet Commonly known as: GLUCOPHAGE Take 1 tablet (500 mg total) by mouth 2 (two) times daily with a meal.   metoprolol succinate 25 MG 24 hr tablet Commonly known as: TOPROL-XL TAKE 1 TABLET BY MOUTH EVERY DAY   multivitamin with minerals Tabs tablet Take 1 tablet by mouth daily.   pantoprazole 40 MG tablet Commonly known as: PROTONIX TAKE 1 TABLET BY MOUTH EVERY DAY   polyethylene glycol powder 17 GM/SCOOP powder Commonly known as: GLYCOLAX/MIRALAX Take 17 g by mouth 2 (two) times daily as needed. What changed: reasons to take this   Semaglutide (1 MG/DOSE) 4 MG/3ML Sopn Inject 1 mg as directed once a week. What changed: additional instructions   traZODone 50 MG tablet Commonly known as: DESYREL TAKE 1 TABLET BY MOUTH EVERYDAY AT BEDTIME What changed:  how much to take how to take this when to take this   trospium 20 MG tablet Commonly known as: SANCTURA Take 1 tablet (20 mg total) by mouth at bedtime.        Contact information for after-discharge care     Destination     [facility] Preferred SNF .   Service: Skilled Nursing Contact information: [address] [place] [address] [phone]                    Discharge Exam: Ceasar Mons Weights   02/21/24 0900 02/24/24 0511  Weight: 103 kg 98.5 kg   Physical Exam on Day of Discharge   General: Alert, cheerful, oriented X3  Oral cavity: moist mucous membranes  Neck: supple  Chest: clear to auscultation. No crackles, no wheezes  CVS: S1,S2 RRR. No murmurs  Abd: No distention, soft, non-tender. No masses palpable  Extr: No edema    Condition at discharge: stable  The results of significant  diagnostics from this hospitalization (including imaging, microbiology, ancillary and laboratory) are listed below for reference.   Imaging Studies: CT Lumbar Spine Wo Contrast Result Date: 02/19/2024 CLINICAL DATA:  78 year old female status post fall sometime in the night. Down for approximately 8 hours. Pain. Ataxia. EXAM: CT LUMBAR SPINE WITHOUT CONTRAST TECHNIQUE: Multidetector CT imaging of the lumbar spine was performed without intravenous contrast administration. Multiplanar CT image reconstructions were also generated. RADIATION DOSE REDUCTION: This exam was performed according to the departmental dose-optimization program which includes automated exposure control, adjustment of the mA and/or kV according to patient size and/or use of iterative reconstruction technique. COMPARISON:  Thoracic spine CT today reported separately. FINDINGS: Segmentation: Normal, concordant with thoracic lumbar and today. Alignment: Straightening of lumbar lordosis with no significant spondylolisthesis or scoliosis. Vertebrae: Degenerative bone heterogeneity of bone mineralization throughout the visible spine. Mild motion artifact at the L3 and L4 levels. Maintained lumbar vertebral body height. Visible sacrum and SI joints appear intact. No acute osseous abnormality identified. Paraspinal and other soft tissues: Aortoiliac calcified atherosclerosis. Negative other visible noncontrast abdominal viscera. Lumbar paraspinal soft tissues remain within normal limits. Disc levels: Bulky lumbar spine degeneration. Multifactorial spinal stenosis at L1-L2 appears moderate to severe and asymmetric to the right (series 6, image 26). Moderate to severe L2-L3 multifactorial spinal stenosis with pronounced vacuum disc there. IMPRESSION: 1. No acute traumatic injury identified in the Lumbar Spine. 2. Widespread severe lumbar spine degeneration, with suspected Moderate Or Severe Multifactorial Spinal Stenosis at both L1-L2 and L2-L3. 3.   Aortic Atherosclerosis (ICD10-I70.0). Electronically Signed   By: Odessa Fleming M.D.   On: 02/19/2024 13:12   CT Thoracic Spine Wo Contrast Result Date: 02/19/2024 CLINICAL DATA:  78 year old female status post fall sometime in the night. Down for approximately 8 hours. Pain. Ataxia. EXAM: CT THORACIC SPINE WITHOUT CONTRAST TECHNIQUE: Multidetector CT images of the thoracic were obtained using the standard protocol without intravenous contrast. RADIATION DOSE REDUCTION: This exam was performed according to the departmental dose-optimization program which includes automated exposure control, adjustment of the mA and/or kV according to patient size and/or use of iterative reconstruction technique. COMPARISON:  Cervical spine CT today. Prior chest CTA 11/10/2021. FINDINGS: Limited cervical spine imaging:  Detailed separately today. Thoracic spine segmentation: Hypoplastic ribs at T12, otherwise normal. Alignment: Stable mild dextroconvex thoracic scoliosis since 2022. Subtle degenerative multilevel upper thoracic anterolisthesis not significantly changed. Subtle degenerative anterolisthesis of T8 on T9 has progressed since 2022. Vertebrae: Maintained thoracic vertebral body height,  stable since 2022. Background bone mineralization within normal limits. Widespread degenerative sclerosis of thoracic vertebrae. No acute osseous abnormality identified. Visible posterior ribs appear intact. Paraspinal and other soft tissues: Calcified aortic atherosclerosis. Visible lung parenchyma not significantly changed since 2022, scattered mild bilateral atelectasis or scarring. No evidence of pericardial or pleural effusion. Calcified coronary artery plaque or stents. Negative visible noncontrast upper abdominal viscera. Thoracic paraspinal soft tissues are within normal limits. Disc levels: Chronic severe thoracic spine degeneration is widespread. Chronic severe disc and endplate degeneration especially in the lower thoracic spine with  vacuum disc T8-T9 through T11-T12. Multifactorial degenerative lower thoracic spinal stenosis at both T11-T12 and T12-L1, at least moderate at the latter (series 4, image 148) which was not included on the previous CTA. Mild multifactorial spinal stenosis suspected at T9-T10 and appears increased. IMPRESSION: 1. No acute traumatic injury identified in the Thoracic Spine. 2. Chronic severe Thoracic Spine degeneration. Multilevel Lower Thoracic Spinal Stenosis appears progressed from a 2022 Chest CTA, and is at least moderate at T12-L1. 3.  Aortic Atherosclerosis (ICD10-I70.0). Electronically Signed   By: Odessa Fleming M.D.   On: 02/19/2024 13:09   CT Cervical Spine Wo Contrast Result Date: 02/19/2024 CLINICAL DATA:  78 year old female status post fall sometime in the night. Down for approximately 8 hours. Pain. Ataxia. EXAM: CT CERVICAL SPINE WITHOUT CONTRAST TECHNIQUE: Multidetector CT imaging of the cervical spine was performed without intravenous contrast. Multiplanar CT image reconstructions were also generated. RADIATION DOSE REDUCTION: This exam was performed according to the departmental dose-optimization program which includes automated exposure control, adjustment of the mA and/or kV according to patient size and/or use of iterative reconstruction technique. COMPARISON:  Head CT today reported separately. Prior cervical spine CT 03/13/2020. FINDINGS: Alignment: Stable straightening of cervical lordosis. Stable cervicothoracic junction alignment. Maintained posterior element alignment. Skull base and vertebrae: Bone mineralization is within normal limits for age. Visualized skull base is intact. No atlanto-occipital dissociation. C1 and C2 appear intact and aligned. No acute osseous abnormality identified. Soft tissues and spinal canal: No prevertebral fluid or swelling. No visible canal hematoma. Calcified cervical carotid artery atherosclerosis. Negative visible noncontrast neck soft tissues. Disc levels:  Advanced cervical spine degeneration, especially right side facet arthropathy and lower cervical disc and endplate degeneration. Evidence of developing degenerative ankylosis of C6-C7. No significant change by CT since 2021. Upper chest: Thoracic spine CT today reported separately. Negative lung apices. Calcified aortic atherosclerosis. IMPRESSION: 1. No acute traumatic injury identified in the cervical spine. 2. Advanced cervical spine degeneration, not significantly changed by CT since 2021. 3. Thoracic spine CT today reported separately. 4.  Aortic Atherosclerosis (ICD10-I70.0). Electronically Signed   By: Odessa Fleming M.D.   On: 02/19/2024 13:03   DG Chest Portable 1 View Result Date: 02/19/2024 CLINICAL DATA:  78 year old female status post fall sometime in the night. Down for approximately 8 hours. Pain. Ataxia. EXAM: PORTABLE CHEST 1 VIEW COMPARISON:  Chest radiographs 12/23/2021. FINDINGS: Portable AP supine views at 1103 hours. Normal lung volumes and mediastinal contours. Visualized tracheal air column is within normal limits. Allowing for portable technique the lungs are clear. No evidence of pneumothorax or pleural effusion on the supine views. Paucity of bowel gas in the upper abdomen. Calcified aortic atherosclerosis. No acute osseous abnormality identified. IMPRESSION: No acute cardiopulmonary abnormality or acute traumatic injury identified. Electronically Signed   By: Odessa Fleming M.D.   On: 02/19/2024 12:59   DG Pelvis 1-2 Views Result Date: 02/19/2024 CLINICAL DATA:  78 year old female status  post fall sometime in the night. Down for approximately 8 hours. Pain. Ataxia. EXAM: PELVIS - 1-2 VIEW COMPARISON:  None Available. FINDINGS: Portable AP supine view at 1100 hours. Bone mineralization is within normal limits for age. Femoral heads normally located. Grossly intact proximal femurs. No pelvis fracture identified. SI joints appear symmetric. Nonobstructed bowel-gas pattern. IMPRESSION: No acute  fracture or dislocation identified about the pelvis. Electronically Signed   By: Odessa Fleming M.D.   On: 02/19/2024 12:58   CT Head Wo Contrast Result Date: 02/19/2024 CLINICAL DATA:  78 year old female status post fall sometime in the night. Down for approximately 8 hours. Pain. Ataxia. EXAM: CT HEAD WITHOUT CONTRAST TECHNIQUE: Contiguous axial images were obtained from the base of the skull through the vertex without intravenous contrast. RADIATION DOSE REDUCTION: This exam was performed according to the departmental dose-optimization program which includes automated exposure control, adjustment of the mA and/or kV according to patient size and/or use of iterative reconstruction technique. COMPARISON:  Brain MRI 05/09/2020.  Head CT 12/23/2021. FINDINGS: Brain: Stable cerebral volume. No midline shift, ventriculomegaly, mass effect, evidence of mass lesion, intracranial hemorrhage or evidence of cortically based acute infarction. Chronic basal ganglia vascular calcifications and heterogeneous hypodensity. Patchy mild to moderate for age periventricular white matter hypodensity. Stable gray-white matter differentiation throughout the brain. Vascular: Calcified atherosclerosis at the skull base. No suspicious intracranial vascular hyperdensity. Skull: Stable, intact.  No acute osseous abnormality identified. Sinuses/Orbits: Visualized paranasal sinuses and mastoids are stable and well aerated. Other: No acute orbit or scalp soft tissue injury identified. IMPRESSION: 1. No acute intracranial abnormality or acute traumatic injury identified. 2. Stable non contrast CT appearance of chronic small vessel disease. Electronically Signed   By: Odessa Fleming M.D.   On: 02/19/2024 12:57    Microbiology: Results for orders placed or performed during the hospital encounter of 02/19/24  Urine Culture     Status: Abnormal   Collection Time: 02/19/24  5:28 PM   Specimen: Urine, Random  Result Value Ref Range Status   Specimen  Description URINE, RANDOM  Final   Special Requests   Final    URINE, CATHETERIZED Performed at [facility], [address] [place], [place] [address]    Culture (A)  Final    >=100,000 COLONIES/mL ESCHERICHIA COLI 80,000 COLONIES/mL KLEBSIELLA AEROGENES    Report Status 02/22/2024 FINAL  Final   Organism ID, Bacteria ESCHERICHIA COLI (A)  Final   Organism ID, Bacteria KLEBSIELLA AEROGENES (A)  Final      Susceptibility   Klebsiella aerogenes - MIC*    CEFEPIME <=0.12 SENSITIVE Sensitive     CEFTRIAXONE <=0.25 SENSITIVE Sensitive     CIPROFLOXACIN <=0.25 SENSITIVE Sensitive     GENTAMICIN <=1 SENSITIVE Sensitive     IMIPENEM 1 SENSITIVE Sensitive     NITROFURANTOIN 128 RESISTANT Resistant     TRIMETH/SULFA <=20 SENSITIVE Sensitive     PIP/TAZO <=4 SENSITIVE Sensitive ug/mL    * 80,000 COLONIES/mL KLEBSIELLA AEROGENES   Escherichia coli - MIC*    AMPICILLIN <=2 SENSITIVE Sensitive     CEFAZOLIN <=4 SENSITIVE Sensitive     CEFEPIME <=0.12 SENSITIVE Sensitive     CEFTRIAXONE <=0.25 SENSITIVE Sensitive     CIPROFLOXACIN <=0.25 SENSITIVE Sensitive     GENTAMICIN <=1 SENSITIVE Sensitive     IMIPENEM <=0.25 SENSITIVE Sensitive     NITROFURANTOIN <=16 SENSITIVE Sensitive     TRIMETH/SULFA <=20 SENSITIVE Sensitive     AMPICILLIN/SULBACTAM <=2 SENSITIVE Sensitive  PIP/TAZO <=4 SENSITIVE Sensitive ug/mL    * >=100,000 COLONIES/mL ESCHERICHIA COLI  Culture, blood (Routine X 2) w Reflex to ID Panel     Status: None   Collection Time: 02/19/24 10:54 PM   Specimen: BLOOD LEFT ARM  Result Value Ref Range Status   Specimen Description BLOOD LEFT ARM  Final   Special Requests   Final    BOTTLES DRAWN AEROBIC AND ANAEROBIC Blood Culture adequate volume   Culture   Final    NO GROWTH 5 DAYS Performed at [facility], [address], [place], [place] [address]    Report Status 02/24/2024 FINAL  Final  Culture, blood (Routine X 2) w Reflex to ID Panel     Status: None    Collection Time: 02/19/24 10:57 PM   Specimen: BLOOD LEFT HAND  Result Value Ref Range Status   Specimen Description BLOOD LEFT HAND  Final   Special Requests   Final    BOTTLES DRAWN AEROBIC AND ANAEROBIC Blood Culture adequate volume   Culture   Final    NO GROWTH 5 DAYS Performed at [facility], [address], [place], [place] [address]    Report Status 02/24/2024 FINAL  Final    Labs: CBC: Recent Labs  Lab 02/19/24 1058 02/19/24 2254 02/20/24 0726 02/21/24 0623 02/23/24 0608 02/24/24 0542  WBC 12.7* 12.1* 9.1 7.3 9.7 10.1  NEUTROABS 9.6*  --   --   --   --   --   HGB 12.5 13.3 11.5* 11.2* 12.2 11.9*  HCT 38.4 40.9 35.4* 34.7* 37.4 37.0  MCV 87.3 88.5 87.8 87.2 87.6 87.9  PLT 311 332 265 261 338 347   Basic Metabolic Panel: Recent Labs  Lab 02/19/24 1058 02/19/24 2254 02/20/24 0726 02/21/24 0623 02/23/24 0608 02/24/24 0542  NA 135  --  133* 136 136 139  K 4.2  --  3.9 3.7 3.8 4.0  CL 102  --  101 105 103 107  CO2 19*  --  22 24 23 22   GLUCOSE 148*  --  147* 150* 159* 144*  BUN 20  --  12 12 14 16   CREATININE 0.81 0.82 0.77 0.76 0.71 0.74  CALCIUM 9.0  --  8.3* 8.5* 8.8* 8.8*  MG  --   --   --  1.8  --   --   PHOS  --   --   --  4.2  --   --    Liver Function Tests: Recent Labs  Lab 02/19/24 1058 02/20/24 0726 02/21/24 0623  AST 54* 36  --   ALT 22 25  --   ALKPHOS 63 54  --   BILITOT 1.5* 0.9  --   PROT 6.3* 5.6*  --   ALBUMIN 3.5 2.9* 2.9*   CBG: Recent Labs  Lab 02/23/24 1209 02/23/24 1716 02/23/24 2009 02/24/24 0645 02/24/24 0741  GLUCAP 111* 133* 152* 156* 161*    Discharge time spent: greater than 30 minutes.  Signed: MDALA-GAUSI, Gwenette Greet, MD [facility] 02/24/2024

## 2024-02-24 NOTE — TOC Transition Note (Signed)
 Transition of Care [facility]) - Discharge Note   Patient Details  Name: Alexandra Wolfe MRN: [id number] Date of Birth: [birth year]  Transition of Care [facility]) CM/SW Contact:  Lopaka Karge A [place], LCSW Phone Number: 02/24/2024, 11:30 AM   Clinical Narrative:     Patient will DC to: [facility]  Anticipated DC date: 02/24/24  Family notified: Kandy Garrison  Transport by: Theodoro Grist ID :[id number]  Approval Dates:  02/24/2024-02/26/2024     Per MD patient ready for DC to [facility]. RN, patient, patient's family, and facility notified of DC. Discharge Summary and FL2 sent to facility. RN to call report prior to discharge [phone], room 117a). DC packet on chart. Ambulance transport requested for patient.     CSW will sign off for now as social work intervention is no longer needed. Please consult [place] again if new needs arise.   Final next level of care: Skilled Nursing Facility Barriers to Discharge: Barriers Resolved   Patient Goals and CMS Choice            Discharge Placement              Patient chooses bed at: [facility] Patient to be transferred to facility by: [facility] Name of family member notified: Kandy Garrison Patient and family notified of of transfer: 02/24/24  Discharge Plan and Services Additional resources added to the After Visit Summary for                                       Social Drivers of Health (SDOH) Interventions SDOH Screenings   Food Insecurity: No Food Insecurity (02/19/2024)  Housing: Low Risk  (02/19/2024)  Transportation Needs: No Transportation Needs (02/19/2024)  Utilities: Not At Risk (02/19/2024)  Alcohol Screen: Low Risk  (06/25/2023)  Depression (PHQ2-9): Low Risk  (06/25/2023)  Recent Concern: Depression (PHQ2-9) - Medium Risk (04/01/2023)  Financial Resource Strain: Low Risk  (06/25/2023)  Physical Activity: Inactive (06/25/2023)  Social Connections: Socially Isolated (02/19/2024)  Stress:  No Stress Concern Present (06/25/2023)  Tobacco Use: Medium Risk (02/19/2024)  Health Literacy: Adequate Health Literacy (06/25/2023)     Readmission Risk Interventions     No data to display

## 2024-02-24 NOTE — Progress Notes (Signed)
 This RN called and gave report to Nurse at [facility] facility that patient is being transferred to. Patient has discharge instructions, home medications, PIV removed and dry dressing applied. A&Ox4. NO distress noted. Patient voice no concerns or complaints.

## 2024-02-24 NOTE — Progress Notes (Signed)
 Occupational Therapy Treatment Patient Details Name: Alexandra Wolfe MRN: [id number] DOB: [birth year] Today's Date: 02/24/2024   History of present illness 78 y.o. female admitted 3/13 with falls and UTI. PMhx: hypothyroidism, DM, HTN, diabetic neuropathy, asthma, morbid obesity, TBI, GERD, chronic pain issues, anxiety with depression, HLD, urinary incontinence, vertigo.   OT comments  Pt progressing toward goals this session, able to ambulate to sink for UB/LB bathing and dressing task in standing. Pt needing up to mod A for ADLs, CGA for bed mobility and CGA for transfers with RW. Pt standing x10 min for bathing task, needed x1 seated rest break before ambulating back around bed to chair. Pt presenting with impairments listed below, will follow acutely. Patient will benefit from continued inpatient follow up therapy, <3 hours/day to maximize safety/ind with ADL/functional mobility.       If plan is discharge home, recommend the following:  Assist for transportation;Assistance with cooking/housework;A lot of help with bathing/dressing/bathroom;A lot of help with walking and/or transfers   Equipment Recommendations  BSC/3in1    Recommendations for Other Services PT consult    Precautions / Restrictions Precautions Precautions: Fall Restrictions Weight Bearing Restrictions Per Provider Order: No       Mobility Bed Mobility Overal bed mobility: Needs Assistance Bed Mobility: Supine to Sit     Supine to sit: Contact guard     General bed mobility comments: cues to scoot forward to get feet on floor    Transfers Overall transfer level: Needs assistance Equipment used: Rolling walker (2 wheels) Transfers: Sit to/from Stand Sit to Stand: Contact guard assist                 Balance Overall balance assessment: Needs assistance Sitting-balance support: Feet supported, Single extremity supported Sitting balance-Leahy Scale: Good     Standing balance support: Rel[place]  on assistive device for balance Standing balance-Leahy Scale: Poor Standing balance comment: static standing with unilateral UE support                           ADL either performed or assessed with clinical judgement   ADL Overall ADL's : Needs assistance/impaired Eating/Feeding: Set up;Sitting   Grooming: Oral care;Wash/dry face;Contact guard assist;Standing   Upper Body Bathing: Minimal assistance;Standing Upper Body Bathing Details (indicate cue type and reason): to wash back Lower Body Bathing: Moderate assistance Lower Body Bathing Details (indicate cue type and reason): to wash lower legs, below knee Upper Body Dressing : Minimal assistance;Sitting Upper Body Dressing Details (indicate cue type and reason): donning gown Lower Body Dressing: Moderate assistance Lower Body Dressing Details (indicate cue type and reason): donning socks EOB Toilet Transfer: Contact guard assist;Ambulation;Rolling walker (2 wheels);Regular Toilet           Functional mobility during ADLs: Rolling walker (2 wheels);Contact guard assist      Extremity/Trunk Assessment Upper Extremity Assessment Upper Extremity Assessment: Generalized weakness   Lower Extremity Assessment Lower Extremity Assessment: Defer to PT evaluation        Vision       Perception Perception Perception: Not tested   Praxis Praxis Praxis: Not tested   Communication Communication Communication: No apparent difficulties   Cognition Arousal: Alert Behavior During Therapy: WFL for tasks assessed/performed Cognition: Cognition impaired       Memory impairment (select all impairments): Short-term memory, Working memory Attention impairment (select first level of impairment): Selective attention Executive functioning impairment (select all impairments): Reasoning, Problem solving  Following commands: Intact        Cueing   Cueing Techniques: Verbal cues, Gestural cues,  Tactile cues  Exercises      Shoulder Instructions       General Comments VSS; SPO2 96% on RA at end of session    Pertinent Vitals/ Pain       Pain Assessment Pain Assessment: No/denies pain  Home Living                                          Prior Functioning/Environment              Frequency  Min 1X/week        Progress Toward Goals  OT Goals(current goals can now be found in the care plan section)  Progress towards OT goals: Progressing toward goals  Acute Rehab OT Goals Patient Stated Goal: to wash up OT Goal Formulation: With patient Time For Goal Achievement: 03/05/24 Potential to Achieve Goals: Good ADL Goals Pt Will Perform Grooming: with supervision;standing Pt Will Perform Upper Body Dressing: with set-up;sitting Pt Will Perform Lower Body Dressing: with contact guard assist;sit to/from stand Pt Will Transfer to Toilet: with supervision;ambulating;regular height toilet Pt/caregiver will Perform Home Exercise Program: Increased strength;Both right and left upper extremity;With theraband;With written HEP provided  Plan      Co-evaluation                 AM-PAC OT "6 Clicks" Daily Activity     Outcome Measure   Help from another person eating meals?: None Help from another person taking care of personal grooming?: A Little Help from another person toileting, which includes using toliet, bedpan, or urinal?: A Little Help from another person bathing (including washing, rinsing, drying)?: A Lot Help from another person to put on and taking off regular upper body clothing?: A Little Help from another person to put on and taking off regular lower body clothing?: A Lot 6 Click Score: 17    End of Session Equipment Utilized During Treatment: Rolling walker (2 wheels)  OT Visit Diagnosis: Unsteadiness on feet (R26.81);Muscle weakness (generalized) (M62.81)   Activity Tolerance Patient tolerated treatment well   Patient  Left in chair;with call bell/phone within reach;with chair alarm set   Nurse Communication Mobility status        Time: 1610-9604 OT Time Calculation (min): 26 min  Charges: OT General Charges $OT Visit: 1 Visit OT Treatments $Self Care/Home Management : 23-37 mins  Alexandra Fila, OTD, OTR/L SecureChat Preferred Acute Rehab [phone]   Alexandra Wolfe 02/24/2024, 9:37 AM

## 2024-02-24 NOTE — Progress Notes (Signed)
 This RN picked up patient's home medications from main pharmacy for discharge. This RN delivered medications to patient's room and were placed in patient's bag.

## 2024-02-24 NOTE — Plan of Care (Signed)

## 2024-02-25 DIAGNOSIS — R531 Weakness: Secondary | ICD-10-CM | POA: Diagnosis not present

## 2024-02-25 DIAGNOSIS — N390 Urinary tract infection, site not specified: Secondary | ICD-10-CM | POA: Diagnosis not present

## 2024-02-26 DIAGNOSIS — E569 Vitamin deficiency, unspecified: Secondary | ICD-10-CM | POA: Diagnosis not present

## 2024-02-26 DIAGNOSIS — N390 Urinary tract infection, site not specified: Secondary | ICD-10-CM | POA: Diagnosis not present

## 2024-02-26 DIAGNOSIS — E785 Hyperlipidemia, unspecified: Secondary | ICD-10-CM | POA: Diagnosis not present

## 2024-02-26 DIAGNOSIS — F339 Major depressive disorder, recurrent, unspecified: Secondary | ICD-10-CM | POA: Diagnosis not present

## 2024-02-26 DIAGNOSIS — E119 Type 2 diabetes mellitus without complications: Secondary | ICD-10-CM | POA: Diagnosis not present

## 2024-02-26 DIAGNOSIS — R531 Weakness: Secondary | ICD-10-CM | POA: Diagnosis not present

## 2024-02-29 DIAGNOSIS — N3281 Overactive bladder: Secondary | ICD-10-CM | POA: Diagnosis not present

## 2024-02-29 DIAGNOSIS — N390 Urinary tract infection, site not specified: Secondary | ICD-10-CM | POA: Diagnosis not present

## 2024-02-29 DIAGNOSIS — R531 Weakness: Secondary | ICD-10-CM | POA: Diagnosis not present

## 2024-02-29 DIAGNOSIS — R300 Dysuria: Secondary | ICD-10-CM | POA: Diagnosis not present

## 2024-03-01 DIAGNOSIS — R300 Dysuria: Secondary | ICD-10-CM | POA: Diagnosis not present

## 2024-03-01 DIAGNOSIS — R531 Weakness: Secondary | ICD-10-CM | POA: Diagnosis not present

## 2024-03-01 DIAGNOSIS — W19XXXA Unspecified fall, initial encounter: Secondary | ICD-10-CM | POA: Diagnosis not present

## 2024-03-01 DIAGNOSIS — M79601 Pain in right arm: Secondary | ICD-10-CM | POA: Diagnosis not present

## 2024-03-03 DIAGNOSIS — N390 Urinary tract infection, site not specified: Secondary | ICD-10-CM | POA: Diagnosis not present

## 2024-03-03 DIAGNOSIS — R296 Repeated falls: Secondary | ICD-10-CM | POA: Diagnosis not present

## 2024-03-03 DIAGNOSIS — N3281 Overactive bladder: Secondary | ICD-10-CM | POA: Diagnosis not present

## 2024-03-03 DIAGNOSIS — R531 Weakness: Secondary | ICD-10-CM | POA: Diagnosis not present

## 2024-03-04 DIAGNOSIS — N390 Urinary tract infection, site not specified: Secondary | ICD-10-CM | POA: Diagnosis not present

## 2024-03-04 DIAGNOSIS — R062 Wheezing: Secondary | ICD-10-CM | POA: Diagnosis not present

## 2024-03-04 DIAGNOSIS — R531 Weakness: Secondary | ICD-10-CM | POA: Diagnosis not present

## 2024-03-04 DIAGNOSIS — R0600 Dyspnea, unspecified: Secondary | ICD-10-CM | POA: Diagnosis not present

## 2024-03-06 DIAGNOSIS — R062 Wheezing: Secondary | ICD-10-CM | POA: Diagnosis not present

## 2024-03-06 DIAGNOSIS — N390 Urinary tract infection, site not specified: Secondary | ICD-10-CM | POA: Diagnosis not present

## 2024-03-06 DIAGNOSIS — R531 Weakness: Secondary | ICD-10-CM | POA: Diagnosis not present

## 2024-03-08 ENCOUNTER — Other Ambulatory Visit: Payer: Self-pay | Admitting: Internal Medicine

## 2024-03-08 MED ORDER — SEMAGLUTIDE (1 MG/DOSE) 4 MG/3ML ~~LOC~~ SOPN
1.0000 mg | PEN_INJECTOR | SUBCUTANEOUS | 3 refills | Status: AC
Start: 2024-03-08 — End: ?

## 2024-03-08 NOTE — Telephone Encounter (Signed)
 Copied from CRM [id number]. Topic: Clinical - Medication Refill >> Mar 08, 2024 11:35 AM Truddie Crumble wrote: Most Recent Primary Care Visit:  Provider: Melida Quitter P  Department[facility]  Visit Type: MEDICARE AWV, SEQUENTIAL  Date: 06/25/2023  Medication: ozempic  Has the patient contacted their pharmacy? Yes (Agent: If no, request that the patient contact the pharmacy for the refill. If patient does not wish to contact the pharmacy document the reason why and proceed with request.) (Agent: If yes, when and what did the pharmacy advise?)  Is this the correct pharmacy for this prescription? Yes If no, delete pharmacy and type the correct one.  This is the patient's preferred pharmacy:  [facility]  [address], [place]  Has the prescription been filled recently? No  Is the patient out of the medication? Yes  Has the patient been seen for an appointment in the last year OR does the patient have an upcoming appointment? Yes  Can we respond through MyChart? No  Agent: Please be advised that Rx refills may take up to 3 business days. We ask that you follow-up with your pharmacy.

## 2024-03-09 DIAGNOSIS — A419 Sepsis, unspecified organism: Secondary | ICD-10-CM | POA: Diagnosis not present

## 2024-03-09 DIAGNOSIS — E049 Nontoxic goiter, unspecified: Secondary | ICD-10-CM | POA: Diagnosis not present

## 2024-03-09 DIAGNOSIS — R32 Unspecified urinary incontinence: Secondary | ICD-10-CM | POA: Diagnosis not present

## 2024-03-09 DIAGNOSIS — K5900 Constipation, unspecified: Secondary | ICD-10-CM | POA: Diagnosis not present

## 2024-03-09 DIAGNOSIS — N390 Urinary tract infection, site not specified: Secondary | ICD-10-CM | POA: Diagnosis not present

## 2024-03-09 DIAGNOSIS — N3281 Overactive bladder: Secondary | ICD-10-CM | POA: Diagnosis not present

## 2024-03-09 DIAGNOSIS — E785 Hyperlipidemia, unspecified: Secondary | ICD-10-CM | POA: Diagnosis not present

## 2024-03-09 DIAGNOSIS — R296 Repeated falls: Secondary | ICD-10-CM | POA: Diagnosis not present

## 2024-03-09 DIAGNOSIS — M549 Dorsalgia, unspecified: Secondary | ICD-10-CM | POA: Diagnosis not present

## 2024-03-09 DIAGNOSIS — F32A Depression, unspecified: Secondary | ICD-10-CM | POA: Diagnosis not present

## 2024-03-09 DIAGNOSIS — J45909 Unspecified asthma, uncomplicated: Secondary | ICD-10-CM | POA: Diagnosis not present

## 2024-03-09 DIAGNOSIS — M79601 Pain in right arm: Secondary | ICD-10-CM | POA: Diagnosis not present

## 2024-03-09 DIAGNOSIS — K219 Gastro-esophageal reflux disease without esophagitis: Secondary | ICD-10-CM | POA: Diagnosis not present

## 2024-03-09 DIAGNOSIS — E1140 Type 2 diabetes mellitus with diabetic neuropathy, unspecified: Secondary | ICD-10-CM | POA: Diagnosis not present

## 2024-03-09 DIAGNOSIS — F419 Anxiety disorder, unspecified: Secondary | ICD-10-CM | POA: Diagnosis not present

## 2024-03-09 DIAGNOSIS — I10 Essential (primary) hypertension: Secondary | ICD-10-CM | POA: Diagnosis not present

## 2024-03-09 DIAGNOSIS — B9620 Unspecified Escherichia coli [E. coli] as the cause of diseases classified elsewhere: Secondary | ICD-10-CM | POA: Diagnosis not present

## 2024-03-09 DIAGNOSIS — E039 Hypothyroidism, unspecified: Secondary | ICD-10-CM | POA: Diagnosis not present

## 2024-03-09 DIAGNOSIS — R4189 Other symptoms and signs involving cognitive functions and awareness: Secondary | ICD-10-CM | POA: Diagnosis not present

## 2024-03-09 DIAGNOSIS — M1990 Unspecified osteoarthritis, unspecified site: Secondary | ICD-10-CM | POA: Diagnosis not present

## 2024-03-09 DIAGNOSIS — B961 Klebsiella pneumoniae [K. pneumoniae] as the cause of diseases classified elsewhere: Secondary | ICD-10-CM | POA: Diagnosis not present

## 2024-03-09 DIAGNOSIS — Z87891 Personal history of nicotine dependence: Secondary | ICD-10-CM | POA: Diagnosis not present

## 2024-03-09 DIAGNOSIS — J309 Allergic rhinitis, unspecified: Secondary | ICD-10-CM | POA: Diagnosis not present

## 2024-03-09 NOTE — Telephone Encounter (Signed)
 Will wait till patient expresses need for another injection before running. Patient does not have a follow up with our office

## 2024-03-11 ENCOUNTER — Telehealth: Payer: Self-pay | Admitting: Internal Medicine

## 2024-03-11 DIAGNOSIS — N390 Urinary tract infection, site not specified: Secondary | ICD-10-CM | POA: Diagnosis not present

## 2024-03-11 DIAGNOSIS — F419 Anxiety disorder, unspecified: Secondary | ICD-10-CM | POA: Diagnosis not present

## 2024-03-11 DIAGNOSIS — R32 Unspecified urinary incontinence: Secondary | ICD-10-CM | POA: Diagnosis not present

## 2024-03-11 DIAGNOSIS — B961 Klebsiella pneumoniae [K. pneumoniae] as the cause of diseases classified elsewhere: Secondary | ICD-10-CM | POA: Diagnosis not present

## 2024-03-11 DIAGNOSIS — N3281 Overactive bladder: Secondary | ICD-10-CM | POA: Diagnosis not present

## 2024-03-11 DIAGNOSIS — K219 Gastro-esophageal reflux disease without esophagitis: Secondary | ICD-10-CM | POA: Diagnosis not present

## 2024-03-11 DIAGNOSIS — E039 Hypothyroidism, unspecified: Secondary | ICD-10-CM | POA: Diagnosis not present

## 2024-03-11 DIAGNOSIS — M1990 Unspecified osteoarthritis, unspecified site: Secondary | ICD-10-CM | POA: Diagnosis not present

## 2024-03-11 DIAGNOSIS — B9620 Unspecified Escherichia coli [E. coli] as the cause of diseases classified elsewhere: Secondary | ICD-10-CM | POA: Diagnosis not present

## 2024-03-11 DIAGNOSIS — M79601 Pain in right arm: Secondary | ICD-10-CM | POA: Diagnosis not present

## 2024-03-11 DIAGNOSIS — R4189 Other symptoms and signs involving cognitive functions and awareness: Secondary | ICD-10-CM | POA: Diagnosis not present

## 2024-03-11 DIAGNOSIS — E1140 Type 2 diabetes mellitus with diabetic neuropathy, unspecified: Secondary | ICD-10-CM | POA: Diagnosis not present

## 2024-03-11 DIAGNOSIS — A419 Sepsis, unspecified organism: Secondary | ICD-10-CM | POA: Diagnosis not present

## 2024-03-11 DIAGNOSIS — J45909 Unspecified asthma, uncomplicated: Secondary | ICD-10-CM | POA: Diagnosis not present

## 2024-03-11 DIAGNOSIS — F32A Depression, unspecified: Secondary | ICD-10-CM | POA: Diagnosis not present

## 2024-03-11 NOTE — Telephone Encounter (Signed)
 Ok for [employer]

## 2024-03-11 NOTE — Telephone Encounter (Signed)
 Copied from CRM [id number]. Topic: Clinical - Home Health Verbal Orders >> Mar 11, 2024  3:40 PM [place] M wrote: Caller/Agency: [facility] Callback Number: [phone] Service Requested: Physical Therapy Frequency: 1 time a week for 9 weeks Any new concerns about the patient? No

## 2024-03-12 NOTE — Telephone Encounter (Signed)
 Called and left voicemail giving verbals.

## 2024-03-15 DIAGNOSIS — R4189 Other symptoms and signs involving cognitive functions and awareness: Secondary | ICD-10-CM | POA: Diagnosis not present

## 2024-03-15 DIAGNOSIS — K219 Gastro-esophageal reflux disease without esophagitis: Secondary | ICD-10-CM | POA: Diagnosis not present

## 2024-03-15 DIAGNOSIS — R32 Unspecified urinary incontinence: Secondary | ICD-10-CM | POA: Diagnosis not present

## 2024-03-15 DIAGNOSIS — E039 Hypothyroidism, unspecified: Secondary | ICD-10-CM | POA: Diagnosis not present

## 2024-03-15 DIAGNOSIS — N3281 Overactive bladder: Secondary | ICD-10-CM | POA: Diagnosis not present

## 2024-03-15 DIAGNOSIS — B9620 Unspecified Escherichia coli [E. coli] as the cause of diseases classified elsewhere: Secondary | ICD-10-CM | POA: Diagnosis not present

## 2024-03-15 DIAGNOSIS — M1990 Unspecified osteoarthritis, unspecified site: Secondary | ICD-10-CM | POA: Diagnosis not present

## 2024-03-15 DIAGNOSIS — M79601 Pain in right arm: Secondary | ICD-10-CM | POA: Diagnosis not present

## 2024-03-15 DIAGNOSIS — J45909 Unspecified asthma, uncomplicated: Secondary | ICD-10-CM | POA: Diagnosis not present

## 2024-03-15 DIAGNOSIS — F32A Depression, unspecified: Secondary | ICD-10-CM | POA: Diagnosis not present

## 2024-03-15 DIAGNOSIS — N390 Urinary tract infection, site not specified: Secondary | ICD-10-CM | POA: Diagnosis not present

## 2024-03-15 DIAGNOSIS — B961 Klebsiella pneumoniae [K. pneumoniae] as the cause of diseases classified elsewhere: Secondary | ICD-10-CM | POA: Diagnosis not present

## 2024-03-15 DIAGNOSIS — A419 Sepsis, unspecified organism: Secondary | ICD-10-CM | POA: Diagnosis not present

## 2024-03-15 DIAGNOSIS — F419 Anxiety disorder, unspecified: Secondary | ICD-10-CM | POA: Diagnosis not present

## 2024-03-15 DIAGNOSIS — E1140 Type 2 diabetes mellitus with diabetic neuropathy, unspecified: Secondary | ICD-10-CM | POA: Diagnosis not present

## 2024-03-16 DIAGNOSIS — J45909 Unspecified asthma, uncomplicated: Secondary | ICD-10-CM | POA: Diagnosis not present

## 2024-03-16 DIAGNOSIS — N3281 Overactive bladder: Secondary | ICD-10-CM | POA: Diagnosis not present

## 2024-03-16 DIAGNOSIS — B9620 Unspecified Escherichia coli [E. coli] as the cause of diseases classified elsewhere: Secondary | ICD-10-CM | POA: Diagnosis not present

## 2024-03-16 DIAGNOSIS — F419 Anxiety disorder, unspecified: Secondary | ICD-10-CM | POA: Diagnosis not present

## 2024-03-16 DIAGNOSIS — A419 Sepsis, unspecified organism: Secondary | ICD-10-CM | POA: Diagnosis not present

## 2024-03-16 DIAGNOSIS — R32 Unspecified urinary incontinence: Secondary | ICD-10-CM | POA: Diagnosis not present

## 2024-03-16 DIAGNOSIS — M79601 Pain in right arm: Secondary | ICD-10-CM | POA: Diagnosis not present

## 2024-03-16 DIAGNOSIS — M1990 Unspecified osteoarthritis, unspecified site: Secondary | ICD-10-CM | POA: Diagnosis not present

## 2024-03-16 DIAGNOSIS — B961 Klebsiella pneumoniae [K. pneumoniae] as the cause of diseases classified elsewhere: Secondary | ICD-10-CM | POA: Diagnosis not present

## 2024-03-16 DIAGNOSIS — K219 Gastro-esophageal reflux disease without esophagitis: Secondary | ICD-10-CM | POA: Diagnosis not present

## 2024-03-16 DIAGNOSIS — E1140 Type 2 diabetes mellitus with diabetic neuropathy, unspecified: Secondary | ICD-10-CM | POA: Diagnosis not present

## 2024-03-16 DIAGNOSIS — N390 Urinary tract infection, site not specified: Secondary | ICD-10-CM | POA: Diagnosis not present

## 2024-03-16 DIAGNOSIS — E039 Hypothyroidism, unspecified: Secondary | ICD-10-CM | POA: Diagnosis not present

## 2024-03-16 DIAGNOSIS — F32A Depression, unspecified: Secondary | ICD-10-CM | POA: Diagnosis not present

## 2024-03-16 DIAGNOSIS — R4189 Other symptoms and signs involving cognitive functions and awareness: Secondary | ICD-10-CM | POA: Diagnosis not present

## 2024-03-22 ENCOUNTER — Ambulatory Visit: Admitting: Internal Medicine

## 2024-03-23 DIAGNOSIS — E1140 Type 2 diabetes mellitus with diabetic neuropathy, unspecified: Secondary | ICD-10-CM | POA: Diagnosis not present

## 2024-03-23 DIAGNOSIS — F32A Depression, unspecified: Secondary | ICD-10-CM | POA: Diagnosis not present

## 2024-03-23 DIAGNOSIS — E039 Hypothyroidism, unspecified: Secondary | ICD-10-CM | POA: Diagnosis not present

## 2024-03-23 DIAGNOSIS — F419 Anxiety disorder, unspecified: Secondary | ICD-10-CM | POA: Diagnosis not present

## 2024-03-23 DIAGNOSIS — J45909 Unspecified asthma, uncomplicated: Secondary | ICD-10-CM | POA: Diagnosis not present

## 2024-03-23 DIAGNOSIS — B9620 Unspecified Escherichia coli [E. coli] as the cause of diseases classified elsewhere: Secondary | ICD-10-CM | POA: Diagnosis not present

## 2024-03-23 DIAGNOSIS — M1990 Unspecified osteoarthritis, unspecified site: Secondary | ICD-10-CM | POA: Diagnosis not present

## 2024-03-23 DIAGNOSIS — R32 Unspecified urinary incontinence: Secondary | ICD-10-CM | POA: Diagnosis not present

## 2024-03-23 DIAGNOSIS — R4189 Other symptoms and signs involving cognitive functions and awareness: Secondary | ICD-10-CM | POA: Diagnosis not present

## 2024-03-23 DIAGNOSIS — N3281 Overactive bladder: Secondary | ICD-10-CM | POA: Diagnosis not present

## 2024-03-23 DIAGNOSIS — B961 Klebsiella pneumoniae [K. pneumoniae] as the cause of diseases classified elsewhere: Secondary | ICD-10-CM | POA: Diagnosis not present

## 2024-03-23 DIAGNOSIS — M79601 Pain in right arm: Secondary | ICD-10-CM | POA: Diagnosis not present

## 2024-03-23 DIAGNOSIS — N390 Urinary tract infection, site not specified: Secondary | ICD-10-CM | POA: Diagnosis not present

## 2024-03-23 DIAGNOSIS — A419 Sepsis, unspecified organism: Secondary | ICD-10-CM | POA: Diagnosis not present

## 2024-03-23 DIAGNOSIS — K219 Gastro-esophageal reflux disease without esophagitis: Secondary | ICD-10-CM | POA: Diagnosis not present

## 2024-03-25 DIAGNOSIS — F419 Anxiety disorder, unspecified: Secondary | ICD-10-CM | POA: Diagnosis not present

## 2024-03-25 DIAGNOSIS — N390 Urinary tract infection, site not specified: Secondary | ICD-10-CM | POA: Diagnosis not present

## 2024-03-25 DIAGNOSIS — B961 Klebsiella pneumoniae [K. pneumoniae] as the cause of diseases classified elsewhere: Secondary | ICD-10-CM | POA: Diagnosis not present

## 2024-03-25 DIAGNOSIS — M79601 Pain in right arm: Secondary | ICD-10-CM | POA: Diagnosis not present

## 2024-03-25 DIAGNOSIS — N3281 Overactive bladder: Secondary | ICD-10-CM | POA: Diagnosis not present

## 2024-03-25 DIAGNOSIS — K219 Gastro-esophageal reflux disease without esophagitis: Secondary | ICD-10-CM | POA: Diagnosis not present

## 2024-03-25 DIAGNOSIS — M1990 Unspecified osteoarthritis, unspecified site: Secondary | ICD-10-CM | POA: Diagnosis not present

## 2024-03-25 DIAGNOSIS — R32 Unspecified urinary incontinence: Secondary | ICD-10-CM | POA: Diagnosis not present

## 2024-03-25 DIAGNOSIS — F32A Depression, unspecified: Secondary | ICD-10-CM | POA: Diagnosis not present

## 2024-03-25 DIAGNOSIS — E039 Hypothyroidism, unspecified: Secondary | ICD-10-CM | POA: Diagnosis not present

## 2024-03-25 DIAGNOSIS — R4189 Other symptoms and signs involving cognitive functions and awareness: Secondary | ICD-10-CM | POA: Diagnosis not present

## 2024-03-25 DIAGNOSIS — A419 Sepsis, unspecified organism: Secondary | ICD-10-CM | POA: Diagnosis not present

## 2024-03-25 DIAGNOSIS — E1140 Type 2 diabetes mellitus with diabetic neuropathy, unspecified: Secondary | ICD-10-CM | POA: Diagnosis not present

## 2024-03-25 DIAGNOSIS — J45909 Unspecified asthma, uncomplicated: Secondary | ICD-10-CM | POA: Diagnosis not present

## 2024-03-25 DIAGNOSIS — B9620 Unspecified Escherichia coli [E. coli] as the cause of diseases classified elsewhere: Secondary | ICD-10-CM | POA: Diagnosis not present

## 2024-03-30 DIAGNOSIS — N3281 Overactive bladder: Secondary | ICD-10-CM | POA: Diagnosis not present

## 2024-03-30 DIAGNOSIS — N390 Urinary tract infection, site not specified: Secondary | ICD-10-CM | POA: Diagnosis not present

## 2024-03-30 DIAGNOSIS — M79601 Pain in right arm: Secondary | ICD-10-CM | POA: Diagnosis not present

## 2024-03-30 DIAGNOSIS — J45909 Unspecified asthma, uncomplicated: Secondary | ICD-10-CM | POA: Diagnosis not present

## 2024-03-30 DIAGNOSIS — M1990 Unspecified osteoarthritis, unspecified site: Secondary | ICD-10-CM | POA: Diagnosis not present

## 2024-03-30 DIAGNOSIS — K219 Gastro-esophageal reflux disease without esophagitis: Secondary | ICD-10-CM | POA: Diagnosis not present

## 2024-03-30 DIAGNOSIS — F32A Depression, unspecified: Secondary | ICD-10-CM | POA: Diagnosis not present

## 2024-03-30 DIAGNOSIS — F419 Anxiety disorder, unspecified: Secondary | ICD-10-CM | POA: Diagnosis not present

## 2024-03-30 DIAGNOSIS — R4189 Other symptoms and signs involving cognitive functions and awareness: Secondary | ICD-10-CM | POA: Diagnosis not present

## 2024-03-30 DIAGNOSIS — A419 Sepsis, unspecified organism: Secondary | ICD-10-CM | POA: Diagnosis not present

## 2024-03-30 DIAGNOSIS — E039 Hypothyroidism, unspecified: Secondary | ICD-10-CM | POA: Diagnosis not present

## 2024-03-30 DIAGNOSIS — B9620 Unspecified Escherichia coli [E. coli] as the cause of diseases classified elsewhere: Secondary | ICD-10-CM | POA: Diagnosis not present

## 2024-03-30 DIAGNOSIS — R32 Unspecified urinary incontinence: Secondary | ICD-10-CM | POA: Diagnosis not present

## 2024-03-30 DIAGNOSIS — B961 Klebsiella pneumoniae [K. pneumoniae] as the cause of diseases classified elsewhere: Secondary | ICD-10-CM | POA: Diagnosis not present

## 2024-03-30 DIAGNOSIS — E1140 Type 2 diabetes mellitus with diabetic neuropathy, unspecified: Secondary | ICD-10-CM | POA: Diagnosis not present

## 2024-03-31 ENCOUNTER — Encounter: Payer: Self-pay | Admitting: Internal Medicine

## 2024-03-31 ENCOUNTER — Ambulatory Visit (INDEPENDENT_AMBULATORY_CARE_PROVIDER_SITE_OTHER): Admitting: Internal Medicine

## 2024-03-31 VITALS — BP 126/72 | HR 82 | Temp 97.9°F | Ht 66.0 in

## 2024-03-31 DIAGNOSIS — S069XAS Unspecified intracranial injury with loss of consciousness status unknown, sequela: Secondary | ICD-10-CM

## 2024-03-31 DIAGNOSIS — N390 Urinary tract infection, site not specified: Secondary | ICD-10-CM | POA: Diagnosis not present

## 2024-03-31 DIAGNOSIS — Z0000 Encounter for general adult medical examination without abnormal findings: Secondary | ICD-10-CM

## 2024-03-31 DIAGNOSIS — E7800 Pure hypercholesterolemia, unspecified: Secondary | ICD-10-CM | POA: Diagnosis not present

## 2024-03-31 DIAGNOSIS — D649 Anemia, unspecified: Secondary | ICD-10-CM | POA: Diagnosis not present

## 2024-03-31 DIAGNOSIS — E119 Type 2 diabetes mellitus without complications: Secondary | ICD-10-CM | POA: Diagnosis not present

## 2024-03-31 DIAGNOSIS — I10 Essential (primary) hypertension: Secondary | ICD-10-CM

## 2024-03-31 DIAGNOSIS — Z0001 Encounter for general adult medical examination with abnormal findings: Secondary | ICD-10-CM

## 2024-03-31 DIAGNOSIS — M48061 Spinal stenosis, lumbar region without neurogenic claudication: Secondary | ICD-10-CM | POA: Insufficient documentation

## 2024-03-31 DIAGNOSIS — Z7984 Long term (current) use of oral hypoglycemic drugs: Secondary | ICD-10-CM

## 2024-03-31 DIAGNOSIS — Z7985 Long-term (current) use of injectable non-insulin antidiabetic drugs: Secondary | ICD-10-CM

## 2024-03-31 LAB — LIPID PANEL
Cholesterol: 152 mg/dL (ref 0–200)
HDL: 45.8 mg/dL (ref >39.00)
LDL Cholesterol: 45 mg/dL (ref 0–99)
NonHDL: 106.11
Total CHOL/HDL Ratio: 3
Triglycerides: 307 mg/dL — ABNORMAL HIGH (ref 0.0–149.0)
VLDL: 61.4 mg/dL — ABNORMAL HIGH (ref 0.0–40.0)

## 2024-03-31 LAB — CBC WITH DIFFERENTIAL/PLATELET
Basophils Absolute: 0.1 10*3/uL (ref 0.0–0.1)
Basophils Relative: 0.4 % (ref 0.0–3.0)
Eosinophils Absolute: 0.3 10*3/uL (ref 0.0–0.7)
Eosinophils Relative: 2 % (ref 0.0–5.0)
HCT: 40.1 % (ref 36.0–46.0)
Hemoglobin: 13 g/dL (ref 12.0–15.0)
Lymphocytes Relative: 30.4 % (ref 12.0–46.0)
Lymphs Abs: 4.4 10*3/uL — ABNORMAL HIGH (ref 0.7–4.0)
MCHC: 32.6 g/dL (ref 30.0–36.0)
MCV: 88.1 fl (ref 78.0–100.0)
Monocytes Absolute: 0.8 10*3/uL (ref 0.1–1.0)
Monocytes Relative: 5.6 % (ref 3.0–12.0)
Neutro Abs: 9 10*3/uL — ABNORMAL HIGH (ref 1.4–7.7)
Neutrophils Relative %: 61.6 % (ref 43.0–77.0)
Platelets: 452 10*3/uL — ABNORMAL HIGH (ref 150.0–400.0)
RBC: 4.55 Mil/uL (ref 3.87–5.11)
RDW: 15.4 % (ref 11.5–15.5)
WBC: 14.6 10*3/uL — ABNORMAL HIGH (ref 4.0–10.5)

## 2024-03-31 LAB — BASIC METABOLIC PANEL WITH GFR
BUN: 20 mg/dL (ref 6–23)
CO2: 25 meq/L (ref 19–32)
Calcium: 9.7 mg/dL (ref 8.4–10.5)
Chloride: 101 meq/L (ref 96–112)
Creatinine, Ser: 0.78 mg/dL (ref 0.40–1.20)
GFR: 72.73 mL/min (ref >60.00)
Glucose, Bld: 154 mg/dL — ABNORMAL HIGH (ref 70–99)
Potassium: 4.8 meq/L (ref 3.5–5.1)
Sodium: 136 meq/L (ref 135–145)

## 2024-03-31 LAB — HEPATIC FUNCTION PANEL
ALT: 14 U/L (ref 0–35)
AST: 18 U/L (ref 0–37)
Albumin: 4.5 g/dL (ref 3.5–5.2)
Alkaline Phosphatase: 68 U/L (ref 39–117)
Bilirubin, Direct: 0.1 mg/dL (ref 0.0–0.3)
Total Bilirubin: 0.4 mg/dL (ref 0.2–1.2)
Total Protein: 7 g/dL (ref 6.0–8.3)

## 2024-03-31 LAB — IBC PANEL
Iron: 58 ug/dL (ref 42–145)
Saturation Ratios: 13.5 % — ABNORMAL LOW (ref 20.0–50.0)
TIBC: 431.2 ug/dL (ref 250.0–450.0)
Transferrin: 308 mg/dL (ref 212.0–360.0)

## 2024-03-31 LAB — TSH: TSH: 1.69 u[IU]/mL (ref 0.35–5.50)

## 2024-03-31 LAB — FERRITIN: Ferritin: 13 ng/mL (ref 10.0–291.0)

## 2024-03-31 MED ORDER — CEPHALEXIN 500 MG PO CAPS: ORAL_CAPSULE | ORAL | 1 refills | Status: DC

## 2024-03-31 NOTE — Assessment & Plan Note (Signed)
 Also for iron lab, cbc

## 2024-03-31 NOTE — Assessment & Plan Note (Signed)
 Age and sex appropriate education and counseling updated with regular exercise and diet Referrals for preventative services - pt to call for eye exam soon Immunizations addressed - for tdap and shingrix at pharmacy Smoking counseling  - none needed Evidence for depression or other mood disorder - stable chronic anxiety Most recent labs reviewed. I have personally reviewed and have noted: 1) the patient's medical and social history 2) The patient's current medications and supplements 3) The patient's height, weight, and BMI have been recorded in the chart

## 2024-03-31 NOTE — Assessment & Plan Note (Signed)
 Persistent pain, recent CT lumbar reviewed, for ortho referral

## 2024-03-31 NOTE — Progress Notes (Signed)
 Patient ID: Alexandra Wolfe, female   DOB: Jan 03, 1946, 78 y.o.   MRN: [id number]         Chief Complaint:: wellness exam and anemia, social needs, recurrent uti, lumbar spinal stenosis       HPI:  Alexandra Wolfe is a 78 y.o. female here for wellness exam; pt to call for eye exam soon, has finished cologuard, declines colonoscopy, for tdap and shingrix at pharmacy, o/w up to date                        Also had recent ecoli and klebsiella UTI and sepsis mar 13 - 18, then 2 wks at rehab, but had another uti while there.  Denies urinary symptoms such as dysuria, frequency, urgency, flank pain, hematuria or n/v, fever, chills.  Also found to have lumbar spinal stenosis, asks for ortho referral.  Has HH with PT starting, has social needs.  Pt denies chest pain, increased sob or doe, wheezing, orthopnea, PND, increased LE swelling, palpitations, dizziness or syncope.   Pt denies polydipsia, polyuria, or new focal neuro s/s.   No overt bleeding.     Wt Readings from Last 3 Encounters:  02/24/24 217 lb 2.5 oz (98.5 kg)  08/05/23 227 lb (103 kg)  11/26/22 220 lb (99.8 kg)   BP Readings from Last 3 Encounters:  03/31/24 126/72  02/24/24 (!) 151/67  01/09/24 122/78   Immunization History  Administered Date(s) Administered   Fluad Quad(high Dose 65+) 12/14/2021, 08/21/2022   H1N1 11/17/2008   Influenza Whole 09/27/2008, 10/04/2009, 08/14/2010   Influenza, High Dose Seasonal PF 09/03/2017, 09/04/2018, 01/07/2020   Influenza, Seasonal, Injecte, Preservative Fre 11/09/2012   Influenza,inj,Quad PF,6+ Mos 11/24/2013, 12/22/2015, 08/22/2016   Moderna Sars-Covid-2 Vaccination 03/15/2020, 04/02/2020   Pneumococcal Conjugate-13 03/01/2015   Pneumococcal Polysaccharide-23 09/27/2008, 11/09/2012   Tdap 11/09/2012   Health Maintenance Due  Topic Date Due   Zoster Vaccines- Shingrix (1 of 2) Never done   Colonoscopy  03/20/2022   DTaP/Tdap/Td (2 - Td or Tdap) 11/09/2022   Diabetic kidney evaluation -  Urine ACR  12/14/2022   OPHTHALMOLOGY EXAM  05/14/2023      Past Medical History:  Diagnosis Date   ALLERGIC RHINITIS 04/02/2009   no per pt   Anxiety    BACK PAIN 09/27/2008   BUNIONS, BILATERAL 12/23/2007   CHEST DISCOMFORT, ATYPICAL 11/07/2009   Chronic LBP    COLONIC POLYPS, HX OF 09/13/2007   CONSTIPATION 09/27/2008   DEPRESSION 09/13/2007   Diabetes mellitus    diet controlled   DYSPNEA ON EXERTION 02/06/2010   Eustachian tube dysfunction 05/20/2011   FOOT PAIN, RIGHT 09/27/2008   HYPERLIPIDEMIA 03/11/2008   LBP (low back pain) 05/20/2011   Left otitis media 08/07/2022   Leukocytosis 11/20/2011   OSTEOARTHROSIS NOS, LOWER LEG 09/13/2007   SHOULDER PAIN, LEFT 02/14/2009   SVT (supraventricular tachycardia) (HCC)    SYMPTOM, PALPITATIONS 09/13/2007   Traumatic brain injury (HCC) 03/21/2021   URINARY INCONTINENCE 08/23/2009   Vertigo 05/20/2011   Past Surgical History:  Procedure Laterality Date   BUNIONECTOMY     right   ccx     CHOLECYSTECTOMY     LUMBAR LAMINECTOMY     LUMBAR LAMINECTOMY     SHOULDER ARTHROSCOPY  12/13/2011   Procedure: ARTHROSCOPY SHOULDER;  Surgeon: Lorriane Rote;  Location: [facility] OR;  Service: Orthopedics;  Laterality: Left;  Left Shoulder ArthroscopyDebridement Limited Tenodesis Open Rotator Cuff Repair Spur Removal Right Shoulder  Injection     reports that she has quit smoking. Her smoking use included cigarettes. She has a 10 pack-year smoking history. She has never used smokeless tobacco. She reports that she does not drink alcohol and does not use drugs. family history includes Colon cancer in her mother; Diabetes in an other family member; Diabetes type II in her father; Heart disease in her brother and father. Allergies  Allergen Reactions   Aripiprazole Other (See Comments)    REACTION: agitation, patient does not recognize   Simvastatin Other (See Comments)    REACTION: myalgia,  patient does not recognize   Current Outpatient  Medications on File Prior to Visit  Medication Sig Dispense Refill   acetaminophen  (TYLENOL ) 325 MG tablet Take 2 tablets (650 mg total) by mouth every 6 (six) hours as needed for mild pain or headache.     aspirin  81 MG EC tablet Take 81 mg by mouth daily.     atorvastatin  (LIPITOR ) 80 MG tablet TAKE 1 TABLET DAILY 90 tablet 3   Calcium  Carbonate (CALCIUM  500 PO) Take 1,000 mg by mouth 2 (two) times daily.      cetirizine  (ZYRTEC ) 10 MG tablet TAKE 1 TABLET DAILY (Patient taking differently: Take 10 mg by mouth daily as needed for allergies or rhinitis.) 90 tablet 2   citalopram  (CELEXA ) 40 MG tablet TAKE 1 TABLET BY MOUTH EVERY DAY 90 tablet 3   diclofenac  Sodium (VOLTAREN ) 1 % GEL Apply 4 g topically 4 (four) times daily. Apply topically to affected area qid (Patient taking differently: Apply 4 g topically 3 (three) times daily as needed (Knee Pain).) 112 g 1   diphenoxylate -atropine  (LOMOTIL ) 2.5-0.025 MG tablet Take 1 tablet by mouth 4 (four) times daily as needed for diarrhea or loose stools. (Patient taking differently: Take 1 tablet by mouth daily as needed for diarrhea or loose stools.) 30 tablet 0   ergocalciferol  (VITAMIN D2) 1.25 MG (50000 UT) capsule Take 1 tablet by mouth daily.     gabapentin  (NEURONTIN ) 100 MG capsule TAKE 1 CAPSULE (100 MG TOTAL) BY MOUTH 3 TIMES A DAY (Patient taking differently: Take 100 mg by mouth 3 (three) times daily.) 270 capsule 2   GEMTESA 75 MG TABS Take 1 tablet by mouth daily.     levothyroxine  (SYNTHROID ) 50 MCG tablet TAKE 1 TABLET(50 MCG) BY MOUTH DAILY. NEEDS APPOINTMENT (Patient taking differently: Take 50 mcg by mouth daily before breakfast.) 90 tablet 3   metFORMIN  (GLUCOPHAGE ) 500 MG tablet Take 1 tablet (500 mg total) by mouth 2 (two) times daily with a meal. 180 tablet 2   metoprolol  succinate (TOPROL -XL) 25 MG 24 hr tablet TAKE 1 TABLET BY MOUTH EVERY DAY 90 tablet 3   Multiple Vitamin (MULTIVITAMIN WITH MINERALS) TABS tablet Take 1 tablet by  mouth daily.     pantoprazole  (PROTONIX ) 40 MG tablet TAKE 1 TABLET BY MOUTH EVERY DAY 90 tablet 3   polyethylene glycol powder (GLYCOLAX /MIRALAX ) 17 GM/SCOOP powder Take 17 g by mouth 2 (two) times daily as needed. (Patient taking differently: Take 17 g by mouth 2 (two) times daily as needed for mild constipation or moderate constipation.) 3350 g 3   Semaglutide , 1 MG/DOSE, 4 MG/3ML SOPN Inject 1 mg as directed once a week. 3 mL 3   traZODone  (DESYREL ) 50 MG tablet TAKE 1 TABLET BY MOUTH EVERYDAY AT BEDTIME (Patient taking differently: Take 50 mg by mouth at bedtime. TAKE 1 TABLET BY MOUTH EVERYDAY AT BEDTIME) 90 tablet 3  trospium  (SANCTURA ) 20 MG tablet Take 1 tablet (20 mg total) by mouth at bedtime. 90 tablet 0   albuterol  (VENTOLIN  HFA) 108 (90 Base) MCG/ACT inhaler INHALE 2 PUFFS INTO THE LUNGS EVERY 6 HOURS AS NEEDED FOR WHEEZING OR SHORTNESS OF BREATH (Patient not taking: Reported on 02/20/2024) 8.5 g 7   budesonide -formoterol  (SYMBICORT ) 160-4.5 MCG/ACT inhaler Inhale 2 puffs into the lungs 2 (two) times daily. (Patient not taking: Reported on 02/20/2024) 6 g 2   No current facility-administered medications on file prior to visit.        ROS:  All others reviewed and negative.  Objective        PE:  BP 126/72 (BP Location: Right Arm, Patient Position: Sitting, Cuff Size: Normal)   Pulse 82   Temp 97.9 F (36.6 C) (Oral)   Ht 5\' 6"  (1.676 m)   SpO2 96%   BMI 35.05 kg/m                 Constitutional: Pt appears in NAD, in wheelchair               HENT: Head: NCAT.                Right Ear: External ear normal.                 Left Ear: External ear normal.                Eyes: . Pupils are equal, round, and reactive to light. Conjunctivae and EOM are normal               Nose: without d/c or deformity               Neck: Neck supple. Gross normal ROM               Cardiovascular: Normal rate and regular rhythm.                 Pulmonary/Chest: Effort normal and breath sounds  without rales or wheezing.                Abd:  Soft, NT, ND, + BS, no organomegaly               Neurological: Pt is alert. At baseline orientation, motor grossly intact               Skin: Skin is warm. No rashes, no other new lesions, LE edema - trace bilateral               Psychiatric: Pt behavior is normal without agitation   Micro: none  Cardiac tracings I have personally interpreted today:  none  Pertinent Radiological findings (summarize): none   Lab Results  Component Value Date   WBC 10.1 02/24/2024   HGB 11.9 (L) 02/24/2024   HCT 37.0 02/24/2024   PLT 347 02/24/2024   GLUCOSE 144 (H) 02/24/2024   CHOL 147 11/26/2022   TRIG 160.0 (H) 11/26/2022   HDL 44.20 11/26/2022   LDLDIRECT 75.0 06/10/2022   LDLCALC 71 11/26/2022   ALT 25 02/20/2024   AST 36 02/20/2024   NA 139 02/24/2024   K 4.0 02/24/2024   CL 107 02/24/2024   CREATININE 0.74 02/24/2024   BUN 16 02/24/2024   CO2 22 02/24/2024   TSH 2.70 11/26/2022   INR 1.2 02/20/2024   HGBA1C 6.4 (H) 02/19/2024   MICROALBUR 0.4 12/14/2021   Assessment/Plan:  Amos Balint  is a 78 y.o. White or Caucasian [1] female with  has a past medical history of ALLERGIC RHINITIS (04/02/2009), Anxiety, BACK PAIN (09/27/2008), BUNIONS, BILATERAL (12/23/2007), CHEST DISCOMFORT, ATYPICAL (11/07/2009), Chronic LBP, COLONIC POLYPS, HX OF (09/13/2007), CONSTIPATION (09/27/2008), DEPRESSION (09/13/2007), Diabetes mellitus, DYSPNEA ON EXERTION (02/06/2010), Eustachian tube dysfunction (05/20/2011), FOOT PAIN, RIGHT (09/27/2008), HYPERLIPIDEMIA (03/11/2008), LBP (low back pain) (05/20/2011), Left otitis media (08/07/2022), Leukocytosis (11/20/2011), OSTEOARTHROSIS NOS, LOWER LEG (09/13/2007), SHOULDER PAIN, LEFT (02/14/2009), SVT (supraventricular tachycardia) (HCC), SYMPTOM, PALPITATIONS (09/13/2007), Traumatic brain injury (HCC) (03/21/2021), URINARY INCONTINENCE (08/23/2009), and Vertigo (05/20/2011).  TBI (traumatic brain injury) (HCC) Ok  for social services referral  Hyperlipidemia Lab Results  Component Value Date   LDLCALC 71 11/26/2022   Uncontrolled, pt to continue current statin lipitor  80 mg , declines add zetia   Diabetes mellitus type II, non insulin  dependent (HCC) Lab Results  Component Value Date   HGBA1C 6.4 (H) 02/19/2024   Stable, pt to continue current medical treatment metformin  500 bid, ozemipc 1 mg weekly   Essential hypertension BP Readings from Last 3 Encounters:  03/31/24 126/72  02/24/24 (!) 151/67  01/09/24 122/78   Stable, pt to continue medical treatment toprol  xl 25 mg qd   Anemia Also for iron lab, cbc  Spinal stenosis of lumbar region Persistent pain, recent CT lumbar reviewed, for ortho referral  Recurrent UTI Ok for preventive cephalexin  500 mg three times weekly,  Encounter for well adult exam with abnormal findings Age and sex appropriate education and counseling updated with regular exercise and diet Referrals for preventative services - pt to call for eye exam soon Immunizations addressed - for tdap and shingrix at pharmacy Smoking counseling  - none needed Evidence for depression or other mood disorder - stable chronic anxiety Most recent labs reviewed. I have personally reviewed and have noted: 1) the patient's medical and social history 2) The patient's current medications and supplements 3) The patient's height, weight, and BMI have been recorded in the chart   Followup: Return in about 4 months (around 07/31/2024).  Rosalia Colonel, MD 03/31/2024 8:46 PM [facility]

## 2024-03-31 NOTE — Assessment & Plan Note (Signed)
 Lab Results  Component Value Date   LDLCALC 71 11/26/2022   Uncontrolled, pt to continue current statin lipitor  80 mg , declines add zetia

## 2024-03-31 NOTE — Assessment & Plan Note (Signed)
 BP Readings from Last 3 Encounters:  03/31/24 126/72  02/24/24 (!) 151/67  01/09/24 122/78   Stable, pt to continue medical treatment toprol  xl 25 mg qd

## 2024-03-31 NOTE — Assessment & Plan Note (Signed)
 Lab Results  Component Value Date   HGBA1C 6.4 (H) 02/19/2024   Stable, pt to continue current medical treatment metformin  500 bid, ozemipc 1 mg weekly

## 2024-03-31 NOTE — Assessment & Plan Note (Addendum)
 Ok for preventive cephalexin  500 mg three times weekly,

## 2024-03-31 NOTE — Patient Instructions (Addendum)
 Please take all new medication as prescribed  - the preventive antibiotic three times per week  Please continue all other medications as before, and refills have been done if requested.  Please have the pharmacy call with any other refills you may need.  Please continue your efforts at being more active, low cholesterol diet, and weight control.  You are otherwise up to date with prevention measures today.  Please keep your appointments with your specialists as you may have planned  You will be contacted regarding the referral for: orthopedic, and Social Services for activity and transportation and any other needs  Please go to the LAB at the blood drawing area for the tests to be done  You will be contacted by phone if any changes need to be made immediately.  Otherwise, you will receive a letter about your results with an explanation, but please check with MyChart first.  Please make an Appointment to return in 4 months, or sooner if needed, also with Lab Appointment for testing done 3-5 days before at the FIRST FLOOR Lab (so this is for TWO appointments - please see the scheduling desk as you leave)

## 2024-03-31 NOTE — Assessment & Plan Note (Signed)
 Ok for social services referral

## 2024-04-01 ENCOUNTER — Encounter: Payer: Self-pay | Admitting: Internal Medicine

## 2024-04-01 DIAGNOSIS — M79601 Pain in right arm: Secondary | ICD-10-CM | POA: Diagnosis not present

## 2024-04-01 DIAGNOSIS — R4189 Other symptoms and signs involving cognitive functions and awareness: Secondary | ICD-10-CM | POA: Diagnosis not present

## 2024-04-01 DIAGNOSIS — F419 Anxiety disorder, unspecified: Secondary | ICD-10-CM | POA: Diagnosis not present

## 2024-04-01 DIAGNOSIS — J45909 Unspecified asthma, uncomplicated: Secondary | ICD-10-CM | POA: Diagnosis not present

## 2024-04-01 DIAGNOSIS — B9620 Unspecified Escherichia coli [E. coli] as the cause of diseases classified elsewhere: Secondary | ICD-10-CM | POA: Diagnosis not present

## 2024-04-01 DIAGNOSIS — B961 Klebsiella pneumoniae [K. pneumoniae] as the cause of diseases classified elsewhere: Secondary | ICD-10-CM | POA: Diagnosis not present

## 2024-04-01 DIAGNOSIS — N3281 Overactive bladder: Secondary | ICD-10-CM | POA: Diagnosis not present

## 2024-04-01 DIAGNOSIS — E1140 Type 2 diabetes mellitus with diabetic neuropathy, unspecified: Secondary | ICD-10-CM | POA: Diagnosis not present

## 2024-04-01 DIAGNOSIS — R32 Unspecified urinary incontinence: Secondary | ICD-10-CM | POA: Diagnosis not present

## 2024-04-01 DIAGNOSIS — M1990 Unspecified osteoarthritis, unspecified site: Secondary | ICD-10-CM | POA: Diagnosis not present

## 2024-04-01 DIAGNOSIS — N390 Urinary tract infection, site not specified: Secondary | ICD-10-CM | POA: Diagnosis not present

## 2024-04-01 DIAGNOSIS — K219 Gastro-esophageal reflux disease without esophagitis: Secondary | ICD-10-CM | POA: Diagnosis not present

## 2024-04-01 DIAGNOSIS — E039 Hypothyroidism, unspecified: Secondary | ICD-10-CM | POA: Diagnosis not present

## 2024-04-01 DIAGNOSIS — F32A Depression, unspecified: Secondary | ICD-10-CM | POA: Diagnosis not present

## 2024-04-01 DIAGNOSIS — A419 Sepsis, unspecified organism: Secondary | ICD-10-CM | POA: Diagnosis not present

## 2024-04-01 LAB — URINALYSIS, ROUTINE W REFLEX MICROSCOPIC
Bilirubin Urine: NEGATIVE
Hgb urine dipstick: NEGATIVE
Nitrite: NEGATIVE
RBC / HPF: NONE SEEN (ref 0–?)
Specific Gravity, Urine: 1.02 (ref 1.000–1.030)
Total Protein, Urine: NEGATIVE
Urine Glucose: NEGATIVE
Urobilinogen, UA: 0.2 (ref 0.0–1.0)
pH: 6 (ref 5.0–8.0)

## 2024-04-01 NOTE — Progress Notes (Signed)
 The test results show that your current treatment is OK, as the tests are stable.  Please continue the same plan.  There is no other need for change of treatment or further evaluation based on these results, at this time.  thanks

## 2024-04-02 ENCOUNTER — Telehealth: Payer: Self-pay

## 2024-04-02 NOTE — Progress Notes (Signed)
 Complex Care Management Note Care Guide Note  04/02/2024 Name: Alexandra Wolfe MRN: [id number] DOB: 08-24-1946  Alexandra Wolfe is a 78 y.o. year old female who is a primary care patient of Autry Legions, Alveda Aures, MD . The community resource team was consulted for assistance with Transportation Needs   SDOH screenings and interventions completed:  Yes  Social Drivers of Health From This Encounter   Food Insecurity: No Food Insecurity (04/02/2024)   Hunger Vital Sign    Worried About Running Out of Food in the Last Year: Never true    Ran Out of Food in the Last Year: Never true  Housing: Low Risk  (04/02/2024)   Housing Stability Vital Sign    Unable to Pay for Housing in the Last Year: No    Number of Times Moved in the Last Year: 0    Homeless in the Last Year: No  Financial Resource Strain: Low Risk  (04/02/2024)   Overall Financial Resource Strain (CARDIA)    Difficulty of Paying Living Expenses: Not hard at all  Transportation Needs: No Transportation Needs (04/02/2024)   PRAPARE - Administrator, Civil Service (Medical): No    Lack of Transportation (Non-Medical): No  Utilities: Not At Risk (04/02/2024)   Utilities    Threatened with loss of utilities: No    SDOH Interventions Today    Flowsheet Row Most Recent Value  SDOH Interventions   Transportation Interventions Other (Comment)  [Spoke with patient,she stated she does not need transportation assistance at this time. Patient stated her son provides transportation for her.]        Care guide performed the following interventions: Patient provided with information about care guide support team and interviewed to confirm resource needs.  Follow Up Plan:  No further follow up planned at this time. The patient has been provided with needed resources.  Encounter Outcome:  Patient Visit Completed  Bralon Antkowiak [facility]  [facility] Guide Direct Dial: [phone]  Fax:  [phone] Website: [email or web address]

## 2024-04-05 ENCOUNTER — Other Ambulatory Visit (HOSPITAL_COMMUNITY): Payer: Self-pay

## 2024-04-05 ENCOUNTER — Telehealth: Payer: Self-pay | Admitting: Pharmacy Technician

## 2024-04-05 DIAGNOSIS — R32 Unspecified urinary incontinence: Secondary | ICD-10-CM | POA: Diagnosis not present

## 2024-04-05 DIAGNOSIS — K219 Gastro-esophageal reflux disease without esophagitis: Secondary | ICD-10-CM | POA: Diagnosis not present

## 2024-04-05 DIAGNOSIS — F419 Anxiety disorder, unspecified: Secondary | ICD-10-CM | POA: Diagnosis not present

## 2024-04-05 DIAGNOSIS — J45909 Unspecified asthma, uncomplicated: Secondary | ICD-10-CM | POA: Diagnosis not present

## 2024-04-05 DIAGNOSIS — N3281 Overactive bladder: Secondary | ICD-10-CM | POA: Diagnosis not present

## 2024-04-05 DIAGNOSIS — N390 Urinary tract infection, site not specified: Secondary | ICD-10-CM | POA: Diagnosis not present

## 2024-04-05 DIAGNOSIS — F32A Depression, unspecified: Secondary | ICD-10-CM | POA: Diagnosis not present

## 2024-04-05 DIAGNOSIS — A419 Sepsis, unspecified organism: Secondary | ICD-10-CM | POA: Diagnosis not present

## 2024-04-05 DIAGNOSIS — M1990 Unspecified osteoarthritis, unspecified site: Secondary | ICD-10-CM | POA: Diagnosis not present

## 2024-04-05 DIAGNOSIS — M79601 Pain in right arm: Secondary | ICD-10-CM | POA: Diagnosis not present

## 2024-04-05 DIAGNOSIS — R4189 Other symptoms and signs involving cognitive functions and awareness: Secondary | ICD-10-CM | POA: Diagnosis not present

## 2024-04-05 DIAGNOSIS — E1140 Type 2 diabetes mellitus with diabetic neuropathy, unspecified: Secondary | ICD-10-CM | POA: Diagnosis not present

## 2024-04-05 DIAGNOSIS — B961 Klebsiella pneumoniae [K. pneumoniae] as the cause of diseases classified elsewhere: Secondary | ICD-10-CM | POA: Diagnosis not present

## 2024-04-05 DIAGNOSIS — B9620 Unspecified Escherichia coli [E. coli] as the cause of diseases classified elsewhere: Secondary | ICD-10-CM | POA: Diagnosis not present

## 2024-04-05 DIAGNOSIS — E039 Hypothyroidism, unspecified: Secondary | ICD-10-CM | POA: Diagnosis not present

## 2024-04-05 NOTE — Telephone Encounter (Signed)
 Pharmacy Patient Advocate Encounter   Received notification from CoverMyMeds that prior authorization for Ozempic  (0.25 or 0.5 MG/DOSE) 2MG /3ML pen-injectors is required/requested.   Insurance verification completed.   The patient is insured through [facility] .   Per test claim: The current 28 day co-pay is, $45.00.  No PA needed at this time. This test claim was processed through [facility]- copay amounts may vary at other pharmacies due to pharmacy/plan contracts, or as the patient moves through the different stages of their insurance plan.

## 2024-04-06 ENCOUNTER — Telehealth: Payer: Self-pay | Admitting: Pharmacy Technician

## 2024-04-06 DIAGNOSIS — K219 Gastro-esophageal reflux disease without esophagitis: Secondary | ICD-10-CM | POA: Diagnosis not present

## 2024-04-06 DIAGNOSIS — N390 Urinary tract infection, site not specified: Secondary | ICD-10-CM | POA: Diagnosis not present

## 2024-04-06 DIAGNOSIS — M79601 Pain in right arm: Secondary | ICD-10-CM | POA: Diagnosis not present

## 2024-04-06 DIAGNOSIS — J45909 Unspecified asthma, uncomplicated: Secondary | ICD-10-CM | POA: Diagnosis not present

## 2024-04-06 DIAGNOSIS — A419 Sepsis, unspecified organism: Secondary | ICD-10-CM | POA: Diagnosis not present

## 2024-04-06 DIAGNOSIS — B961 Klebsiella pneumoniae [K. pneumoniae] as the cause of diseases classified elsewhere: Secondary | ICD-10-CM | POA: Diagnosis not present

## 2024-04-06 DIAGNOSIS — E1140 Type 2 diabetes mellitus with diabetic neuropathy, unspecified: Secondary | ICD-10-CM | POA: Diagnosis not present

## 2024-04-06 DIAGNOSIS — M1990 Unspecified osteoarthritis, unspecified site: Secondary | ICD-10-CM | POA: Diagnosis not present

## 2024-04-06 DIAGNOSIS — N3281 Overactive bladder: Secondary | ICD-10-CM | POA: Diagnosis not present

## 2024-04-06 DIAGNOSIS — R4189 Other symptoms and signs involving cognitive functions and awareness: Secondary | ICD-10-CM | POA: Diagnosis not present

## 2024-04-06 DIAGNOSIS — E039 Hypothyroidism, unspecified: Secondary | ICD-10-CM | POA: Diagnosis not present

## 2024-04-06 DIAGNOSIS — B9620 Unspecified Escherichia coli [E. coli] as the cause of diseases classified elsewhere: Secondary | ICD-10-CM | POA: Diagnosis not present

## 2024-04-06 DIAGNOSIS — F32A Depression, unspecified: Secondary | ICD-10-CM | POA: Diagnosis not present

## 2024-04-06 DIAGNOSIS — F419 Anxiety disorder, unspecified: Secondary | ICD-10-CM | POA: Diagnosis not present

## 2024-04-06 DIAGNOSIS — R32 Unspecified urinary incontinence: Secondary | ICD-10-CM | POA: Diagnosis not present

## 2024-04-06 NOTE — Telephone Encounter (Signed)
 Received another notice that the ozempic  needed a prior authorization so I tried one and it said: prior authorization not needed.   I called [facility] and asked them to fill it. They said it went through now for 45.00

## 2024-04-08 DIAGNOSIS — K219 Gastro-esophageal reflux disease without esophagitis: Secondary | ICD-10-CM | POA: Diagnosis not present

## 2024-04-08 DIAGNOSIS — I10 Essential (primary) hypertension: Secondary | ICD-10-CM | POA: Diagnosis not present

## 2024-04-08 DIAGNOSIS — F32A Depression, unspecified: Secondary | ICD-10-CM | POA: Diagnosis not present

## 2024-04-08 DIAGNOSIS — J45909 Unspecified asthma, uncomplicated: Secondary | ICD-10-CM | POA: Diagnosis not present

## 2024-04-08 DIAGNOSIS — F419 Anxiety disorder, unspecified: Secondary | ICD-10-CM | POA: Diagnosis not present

## 2024-04-08 DIAGNOSIS — M1990 Unspecified osteoarthritis, unspecified site: Secondary | ICD-10-CM | POA: Diagnosis not present

## 2024-04-08 DIAGNOSIS — R296 Repeated falls: Secondary | ICD-10-CM | POA: Diagnosis not present

## 2024-04-08 DIAGNOSIS — Z87891 Personal history of nicotine dependence: Secondary | ICD-10-CM | POA: Diagnosis not present

## 2024-04-08 DIAGNOSIS — E039 Hypothyroidism, unspecified: Secondary | ICD-10-CM | POA: Diagnosis not present

## 2024-04-08 DIAGNOSIS — B961 Klebsiella pneumoniae [K. pneumoniae] as the cause of diseases classified elsewhere: Secondary | ICD-10-CM | POA: Diagnosis not present

## 2024-04-08 DIAGNOSIS — E1140 Type 2 diabetes mellitus with diabetic neuropathy, unspecified: Secondary | ICD-10-CM | POA: Diagnosis not present

## 2024-04-08 DIAGNOSIS — N390 Urinary tract infection, site not specified: Secondary | ICD-10-CM | POA: Diagnosis not present

## 2024-04-08 DIAGNOSIS — M549 Dorsalgia, unspecified: Secondary | ICD-10-CM | POA: Diagnosis not present

## 2024-04-08 DIAGNOSIS — E785 Hyperlipidemia, unspecified: Secondary | ICD-10-CM | POA: Diagnosis not present

## 2024-04-08 DIAGNOSIS — E049 Nontoxic goiter, unspecified: Secondary | ICD-10-CM | POA: Diagnosis not present

## 2024-04-08 DIAGNOSIS — B9620 Unspecified Escherichia coli [E. coli] as the cause of diseases classified elsewhere: Secondary | ICD-10-CM | POA: Diagnosis not present

## 2024-04-08 DIAGNOSIS — N3281 Overactive bladder: Secondary | ICD-10-CM | POA: Diagnosis not present

## 2024-04-08 DIAGNOSIS — M79601 Pain in right arm: Secondary | ICD-10-CM | POA: Diagnosis not present

## 2024-04-08 DIAGNOSIS — K5900 Constipation, unspecified: Secondary | ICD-10-CM | POA: Diagnosis not present

## 2024-04-08 DIAGNOSIS — J309 Allergic rhinitis, unspecified: Secondary | ICD-10-CM | POA: Diagnosis not present

## 2024-04-08 DIAGNOSIS — A419 Sepsis, unspecified organism: Secondary | ICD-10-CM | POA: Diagnosis not present

## 2024-04-08 DIAGNOSIS — R4189 Other symptoms and signs involving cognitive functions and awareness: Secondary | ICD-10-CM | POA: Diagnosis not present

## 2024-04-08 DIAGNOSIS — R32 Unspecified urinary incontinence: Secondary | ICD-10-CM | POA: Diagnosis not present

## 2024-04-14 ENCOUNTER — Ambulatory Visit (INDEPENDENT_AMBULATORY_CARE_PROVIDER_SITE_OTHER): Admitting: Physical Medicine and Rehabilitation

## 2024-04-14 ENCOUNTER — Telehealth: Payer: Self-pay | Admitting: Internal Medicine

## 2024-04-14 ENCOUNTER — Encounter: Payer: Self-pay | Admitting: Physical Medicine and Rehabilitation

## 2024-04-14 DIAGNOSIS — M5441 Lumbago with sciatica, right side: Secondary | ICD-10-CM

## 2024-04-14 DIAGNOSIS — M5442 Lumbago with sciatica, left side: Secondary | ICD-10-CM | POA: Diagnosis not present

## 2024-04-14 DIAGNOSIS — M48062 Spinal stenosis, lumbar region with neurogenic claudication: Secondary | ICD-10-CM

## 2024-04-14 DIAGNOSIS — G8929 Other chronic pain: Secondary | ICD-10-CM

## 2024-04-14 DIAGNOSIS — M5416 Radiculopathy, lumbar region: Secondary | ICD-10-CM | POA: Diagnosis not present

## 2024-04-14 DIAGNOSIS — R296 Repeated falls: Secondary | ICD-10-CM

## 2024-04-14 DIAGNOSIS — R29818 Other symptoms and signs involving the nervous system: Secondary | ICD-10-CM

## 2024-04-14 DIAGNOSIS — S069XAS Unspecified intracranial injury with loss of consciousness status unknown, sequela: Secondary | ICD-10-CM

## 2024-04-14 NOTE — Telephone Encounter (Signed)
 Copied from CRM [id number]. Topic: Referral - Request for Referral >> Apr 14, 2024  2:44 PM Dorisann Garre T wrote: Did the patient discuss referral with their provider in the last year? Yes (If No - schedule appointment) (If Yes - send message)  Appointment offered? No  Type of order/referral and detailed reason for visit: assistant living   Preference of office, provider, location: na  If referral order, have you been seen by this specialty before? No (If Yes, this issue or another issue? When? Where?  Can we respond through MyChart? No

## 2024-04-14 NOTE — Progress Notes (Signed)
 Core Outcome Measures Index (COMI) Back Score  Average Pain 8  COMI Score 80%

## 2024-04-14 NOTE — Progress Notes (Signed)
 Pain Scale   Average Pain 5 Patient advising she has Chronic middle to lower back pain, patient advising her pain increases at night when laying in bed. Without relief.        +Driver, -BT, -Dye Allergies.

## 2024-04-14 NOTE — Progress Notes (Signed)
 Alexandra Wolfe - 78 y.o. female MRN [id number]  Date of birth: 1946/02/23  Office Visit Note: Visit Date: 04/14/2024 PCP: Roslyn Coombe, MD Referred by: Roslyn Coombe, MD  Subjective: Chief Complaint  Patient presents with   Middle Back - Pain   HPI: Alexandra Wolfe is a 78 y.o. female who comes in today per the request of Dr. Rosalia Colonel for evaluation of chronic, worsening and severe bilateral lower back pain radiating down both legs, left greater than right. Patient is somewhat of poor historian, her son Alexandra Wolfe accompanying her during visit today. Pain ongoing for several years. Her pain becomes worse with prolonged sitting, standing and walking. She describes pain as sore and throbbing sensation, currently rates as 8 out of 10. Some relief of pain with home exercise regimen, rest and use of medications. OT and PT are currently coming to her house on weekly basis. She does take Tylenol  as needed. Recent CT of lumbar spine shows moderate or severe multifactorial spinal stenosis at both L1-L2 and L2-L3. She reports history of lumbar surgery about 40 plus years ago in [place]. Per patient, this surgery was for herniated disc issue. 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 Garlan Juniper with [facility]. She is currently using rolling walker to assist with ambulation. Last fall in March.   Patient has complex medical history including TBI, diabetes mellitus, recurrent falls and UTI. She was recently admitted to hospital on 02/19/2024 for fall and sepsis. She was discharged to rehab facility for 2 weeks, she is now leaving at home by herself. Her son Alexandra Wolfe is primary care giver, he voiced frustration today as he feels his mother does not need to be living by herself and would benefit from placement into facility.       Review of Systems  Constitutional:  Positive for malaise/fatigue.  Respiratory:  Positive for shortness of breath.   Musculoskeletal:   Positive for back pain, falls and joint pain.  Neurological:  Negative for tingling, sensory change, focal weakness and weakness.  All other systems reviewed and are negative.  Otherwise per HPI.  Assessment & Plan: Visit Diagnoses:    ICD-10-CM   1. Chronic bilateral low back pain with bilateral sciatica  M54.42 MR LUMBAR SPINE WO CONTRAST   M54.41    G89.29     2. Lumbar radiculopathy  M54.16 MR LUMBAR SPINE WO CONTRAST    3. Spinal stenosis of lumbar region with neurogenic claudication  M48.062 MR LUMBAR SPINE WO CONTRAST    4. Recurrent falls  R29.6 MR LUMBAR SPINE WO CONTRAST       Plan: Findings:  Chronic, worsening and severe bilateral lower back pain radiating down both legs, left greater than right.  Patient continues to have severe pain despite good conservative therapy such as home exercise regimen, rest and use of medications.  Patient's clinical presentation and exam are consistent with neurogenic claudication as a result of spinal canal stenosis.  There is moderate or severe spinal canal stenosis noted at the levels of L1-L2 and L2-L3 on recent CT of the lumbar spine.  We discussed treatment plan in detail today.  Next step is to obtain lumbar MRI imaging in preparation for possible lumbar epidural steroid injection.  I I discussed injection procedure with patient and son today, patient does feel she will be able to get onto procedure table with assistance.  I explained to the patient that we perform these injections  diagnostically to help treat pain and that she should continue to work with OT/PT to address weakness and issues with mobility.  I will call her son with lumbar MRI results and proceed with injection therapy if warranted.  I encouraged her son to speak with Dr. Autry Legions regarding placement into skilled facility.  Patient has no questions at this time.  No red flag symptoms noted upon exam today.    Meds & Orders: No orders of the defined types were placed in this  encounter.   Orders Placed This Encounter  Procedures   MR LUMBAR SPINE WO CONTRAST    Follow-up: Return for Lumbar MRI review-call son with results.   Procedures: No procedures performed      Clinical History: No specialty comments available.   She reports that she has quit smoking. Her smoking use included cigarettes. She has a 10 pack-year smoking history. She has never used smokeless tobacco.  Recent Labs    02/19/24 2254  HGBA1C 6.4*    Objective:  VS:  HT:    WT:   BMI:     BP:   HR: bpm  TEMP: ( )  RESP:  Physical Exam Vitals and nursing note reviewed.  HENT:     Head: Normocephalic and atraumatic.     Right Ear: External ear normal.     Left Ear: External ear normal.     Nose: Nose normal.     Mouth/Throat:     Mouth: Mucous membranes are moist.  Eyes:     Extraocular Movements: Extraocular movements intact.  Cardiovascular:     Rate and Rhythm: Normal rate.     Pulses: Normal pulses.  Pulmonary:     Effort: Pulmonary effort is normal.  Abdominal:     General: Abdomen is flat. There is no distension.  Musculoskeletal:        General: Tenderness present.     Cervical back: Normal range of motion.     Comments: Difficult exam today due to pain and decreased effort. Patient requires assistance when moving from sitting to standing position. Good lumbar range of motion. No pain noted with facet loading. 5/5 strength noted with right hip flexion, knee flexion/extension, ankle dorsiflexion/plantarflexion and EHL  4/5 strength noted with left hip flexion, knee flexion/extension, ankle dorsiflexion/plantarflexion and EHL  No clonus noted bilaterally. No pain upon palpation of greater trochanters. No pain with internal/external rotation of bilateral hips. Sensation intact bilaterally. Negative slump test bilaterally. Ambulates without aid, gait steady.     Skin:    General: Skin is warm and dry.     Capillary Refill: Capillary refill takes less than 2 seconds.   Neurological:     Mental Status: She is alert and oriented to person, place, and time.     Motor: Weakness present.     Gait: Gait abnormal.  Psychiatric:        Mood and Affect: Mood normal.        Behavior: Behavior normal.     Ortho Exam  Imaging: No results found.  Past Medical/Family/Surgical/Social History: Medications & Allergies reviewed per EMR, new medications updated. Patient Active Problem List   Diagnosis Date Noted   Anemia 03/31/2024   Spinal stenosis of lumbar region 03/31/2024   Recurrent UTI 03/31/2024   UTI (urinary tract infection) 02/19/2024   Sepsis (HCC) 02/19/2024   Anxiety 11/26/2022   Chronic pain of both knees 12/15/2021   GERD (gastroesophageal reflux disease) 12/14/2021   Insomnia 12/14/2021   Morbid obesity (  HCC) 01/13/2021   Recurrent falls 07/05/2020   Body mass index (BMI) 37.0-37.9, adult 04/10/2020   TBI (traumatic brain injury) (HCC) 04/10/2020   Neurocognitive deficits 03/30/2020   Acute subdural hematoma 02/29/2020   Syncope 02/29/2020   Mild renal insufficiency 02/29/2020   Essential hypertension 02/29/2020   Asthma 02/29/2020   Chronic dyspnea 01/04/2020   Allergic rhinitis 01/04/2020   Bilateral foot pain 07/01/2019   Pain in left knee 01/05/2019   Pain in right knee 01/05/2019   Diabetic neuropathy (HCC) 03/04/2018   Obstructive sleep apnea 04/04/2016   Peripheral edema 12/23/2015   Hypersomnolence 12/22/2015   Baker's cyst of knee 11/24/2013   Left knee pain 11/24/2013   Rash 05/26/2012   Bilateral shoulder pain 12/13/2011   Diabetes mellitus type II, non insulin  dependent (HCC) 11/20/2011   Leukocytosis 11/20/2011   Preoperative clearance 11/20/2011   Eustachian tube dysfunction 05/20/2011   Low back pain 05/20/2011   Encounter for well adult exam with abnormal findings 05/20/2011   Vertigo 05/20/2011   Hypothyroidism 02/15/2011   Dysphagia 02/15/2011   URINARY INCONTINENCE 08/23/2009   Seasonal and perennial  allergic rhinitis 04/02/2009   Constipation 09/27/2008   Hyperlipidemia 03/11/2008   BUNIONS, BILATERAL 12/23/2007   Depression 09/13/2007   Degenerative arthritis of knee, bilateral 09/13/2007   History of colonic polyps 09/13/2007   Past Medical History:  Diagnosis Date   ALLERGIC RHINITIS 04/02/2009   no per pt   Anxiety    BACK PAIN 09/27/2008   BUNIONS, BILATERAL 12/23/2007   CHEST DISCOMFORT, ATYPICAL 11/07/2009   Chronic LBP    COLONIC POLYPS, HX OF 09/13/2007   CONSTIPATION 09/27/2008   DEPRESSION 09/13/2007   Diabetes mellitus    diet controlled   DYSPNEA ON EXERTION 02/06/2010   Eustachian tube dysfunction 05/20/2011   FOOT PAIN, RIGHT 09/27/2008   HYPERLIPIDEMIA 03/11/2008   LBP (low back pain) 05/20/2011   Left otitis media 08/07/2022   Leukocytosis 11/20/2011   OSTEOARTHROSIS NOS, LOWER LEG 09/13/2007   SHOULDER PAIN, LEFT 02/14/2009   SVT (supraventricular tachycardia) (HCC)    SYMPTOM, PALPITATIONS 09/13/2007   Traumatic brain injury (HCC) 03/21/2021   URINARY INCONTINENCE 08/23/2009   Vertigo 05/20/2011   Family History  Problem Relation Age of Onset   Colon cancer Mother    Heart disease Father    Diabetes type II Father    Heart disease Brother    Diabetes Other        father   Esophageal cancer Neg Hx    Stomach cancer Neg Hx    Rectal cancer Neg Hx    Past Surgical History:  Procedure Laterality Date   BUNIONECTOMY     right   ccx     CHOLECYSTECTOMY     LUMBAR LAMINECTOMY     LUMBAR LAMINECTOMY     SHOULDER ARTHROSCOPY  12/13/2011   Procedure: ARTHROSCOPY SHOULDER;  Surgeon: Lorriane Rote;  Location: [facility] OR;  Service: Orthopedics;  Laterality: Left;  Left Shoulder ArthroscopyDebridement Limited Tenodesis Open Rotator Cuff Repair Spur Removal Right Shoulder Injection    Social History   Occupational History   Occupation: Disabled Psych.  Tobacco Use   Smoking status: Former    Current packs/day: 1.00    Average packs/day: 1  pack/day for 10.0 years (10.0 ttl pk-yrs)    Types: Cigarettes   Smokeless tobacco: Never   Tobacco comments:    Smoked as a teenager. Quit in 1970  Substance and Sexual Activity   Alcohol use: No  Alcohol/week: 0.0 standard drinks of alcohol   Drug use: No   Sexual activity: Never

## 2024-04-14 NOTE — Telephone Encounter (Signed)
 Ok I have referred for social worker to help with this, hopefully she will hear soon    thanks

## 2024-04-15 ENCOUNTER — Encounter: Payer: Self-pay | Admitting: Physical Medicine and Rehabilitation

## 2024-04-15 DIAGNOSIS — F419 Anxiety disorder, unspecified: Secondary | ICD-10-CM | POA: Diagnosis not present

## 2024-04-15 DIAGNOSIS — R296 Repeated falls: Secondary | ICD-10-CM | POA: Diagnosis not present

## 2024-04-15 DIAGNOSIS — R4189 Other symptoms and signs involving cognitive functions and awareness: Secondary | ICD-10-CM | POA: Diagnosis not present

## 2024-04-15 DIAGNOSIS — J309 Allergic rhinitis, unspecified: Secondary | ICD-10-CM | POA: Diagnosis not present

## 2024-04-15 DIAGNOSIS — K219 Gastro-esophageal reflux disease without esophagitis: Secondary | ICD-10-CM | POA: Diagnosis not present

## 2024-04-15 DIAGNOSIS — A419 Sepsis, unspecified organism: Secondary | ICD-10-CM | POA: Diagnosis not present

## 2024-04-15 DIAGNOSIS — I10 Essential (primary) hypertension: Secondary | ICD-10-CM | POA: Diagnosis not present

## 2024-04-15 DIAGNOSIS — K5900 Constipation, unspecified: Secondary | ICD-10-CM | POA: Diagnosis not present

## 2024-04-15 DIAGNOSIS — M79601 Pain in right arm: Secondary | ICD-10-CM | POA: Diagnosis not present

## 2024-04-15 DIAGNOSIS — E785 Hyperlipidemia, unspecified: Secondary | ICD-10-CM | POA: Diagnosis not present

## 2024-04-15 DIAGNOSIS — J45909 Unspecified asthma, uncomplicated: Secondary | ICD-10-CM | POA: Diagnosis not present

## 2024-04-15 DIAGNOSIS — Z87891 Personal history of nicotine dependence: Secondary | ICD-10-CM | POA: Diagnosis not present

## 2024-04-15 DIAGNOSIS — N390 Urinary tract infection, site not specified: Secondary | ICD-10-CM | POA: Diagnosis not present

## 2024-04-15 DIAGNOSIS — N3281 Overactive bladder: Secondary | ICD-10-CM | POA: Diagnosis not present

## 2024-04-15 DIAGNOSIS — B961 Klebsiella pneumoniae [K. pneumoniae] as the cause of diseases classified elsewhere: Secondary | ICD-10-CM | POA: Diagnosis not present

## 2024-04-15 DIAGNOSIS — R32 Unspecified urinary incontinence: Secondary | ICD-10-CM | POA: Diagnosis not present

## 2024-04-15 DIAGNOSIS — E1140 Type 2 diabetes mellitus with diabetic neuropathy, unspecified: Secondary | ICD-10-CM | POA: Diagnosis not present

## 2024-04-15 DIAGNOSIS — E049 Nontoxic goiter, unspecified: Secondary | ICD-10-CM | POA: Diagnosis not present

## 2024-04-15 DIAGNOSIS — B9620 Unspecified Escherichia coli [E. coli] as the cause of diseases classified elsewhere: Secondary | ICD-10-CM | POA: Diagnosis not present

## 2024-04-15 DIAGNOSIS — F32A Depression, unspecified: Secondary | ICD-10-CM | POA: Diagnosis not present

## 2024-04-15 DIAGNOSIS — M549 Dorsalgia, unspecified: Secondary | ICD-10-CM | POA: Diagnosis not present

## 2024-04-15 DIAGNOSIS — M1990 Unspecified osteoarthritis, unspecified site: Secondary | ICD-10-CM | POA: Diagnosis not present

## 2024-04-15 DIAGNOSIS — E039 Hypothyroidism, unspecified: Secondary | ICD-10-CM | POA: Diagnosis not present

## 2024-04-16 ENCOUNTER — Telehealth: Payer: Self-pay | Admitting: Family Medicine

## 2024-04-16 NOTE — Telephone Encounter (Signed)
 Patient is scheduled for bilateral zilretta  injections on Friday, 5/16.  Can you confirm authorization?

## 2024-04-19 DIAGNOSIS — J309 Allergic rhinitis, unspecified: Secondary | ICD-10-CM | POA: Diagnosis not present

## 2024-04-19 DIAGNOSIS — M79601 Pain in right arm: Secondary | ICD-10-CM | POA: Diagnosis not present

## 2024-04-19 DIAGNOSIS — A419 Sepsis, unspecified organism: Secondary | ICD-10-CM | POA: Diagnosis not present

## 2024-04-19 DIAGNOSIS — E1140 Type 2 diabetes mellitus with diabetic neuropathy, unspecified: Secondary | ICD-10-CM | POA: Diagnosis not present

## 2024-04-19 DIAGNOSIS — K5900 Constipation, unspecified: Secondary | ICD-10-CM | POA: Diagnosis not present

## 2024-04-19 DIAGNOSIS — R4189 Other symptoms and signs involving cognitive functions and awareness: Secondary | ICD-10-CM | POA: Diagnosis not present

## 2024-04-19 DIAGNOSIS — J45909 Unspecified asthma, uncomplicated: Secondary | ICD-10-CM | POA: Diagnosis not present

## 2024-04-19 DIAGNOSIS — Z87891 Personal history of nicotine dependence: Secondary | ICD-10-CM | POA: Diagnosis not present

## 2024-04-19 DIAGNOSIS — E049 Nontoxic goiter, unspecified: Secondary | ICD-10-CM | POA: Diagnosis not present

## 2024-04-19 DIAGNOSIS — B961 Klebsiella pneumoniae [K. pneumoniae] as the cause of diseases classified elsewhere: Secondary | ICD-10-CM | POA: Diagnosis not present

## 2024-04-19 DIAGNOSIS — R296 Repeated falls: Secondary | ICD-10-CM | POA: Diagnosis not present

## 2024-04-19 DIAGNOSIS — B9620 Unspecified Escherichia coli [E. coli] as the cause of diseases classified elsewhere: Secondary | ICD-10-CM | POA: Diagnosis not present

## 2024-04-19 DIAGNOSIS — F419 Anxiety disorder, unspecified: Secondary | ICD-10-CM | POA: Diagnosis not present

## 2024-04-19 DIAGNOSIS — I10 Essential (primary) hypertension: Secondary | ICD-10-CM | POA: Diagnosis not present

## 2024-04-19 DIAGNOSIS — K219 Gastro-esophageal reflux disease without esophagitis: Secondary | ICD-10-CM | POA: Diagnosis not present

## 2024-04-19 DIAGNOSIS — R32 Unspecified urinary incontinence: Secondary | ICD-10-CM | POA: Diagnosis not present

## 2024-04-19 DIAGNOSIS — M549 Dorsalgia, unspecified: Secondary | ICD-10-CM | POA: Diagnosis not present

## 2024-04-19 DIAGNOSIS — E785 Hyperlipidemia, unspecified: Secondary | ICD-10-CM | POA: Diagnosis not present

## 2024-04-19 DIAGNOSIS — N3281 Overactive bladder: Secondary | ICD-10-CM | POA: Diagnosis not present

## 2024-04-19 DIAGNOSIS — E039 Hypothyroidism, unspecified: Secondary | ICD-10-CM | POA: Diagnosis not present

## 2024-04-19 DIAGNOSIS — M1990 Unspecified osteoarthritis, unspecified site: Secondary | ICD-10-CM | POA: Diagnosis not present

## 2024-04-19 DIAGNOSIS — F32A Depression, unspecified: Secondary | ICD-10-CM | POA: Diagnosis not present

## 2024-04-19 DIAGNOSIS — N390 Urinary tract infection, site not specified: Secondary | ICD-10-CM | POA: Diagnosis not present

## 2024-04-20 ENCOUNTER — Telehealth: Payer: Self-pay

## 2024-04-20 DIAGNOSIS — K5900 Constipation, unspecified: Secondary | ICD-10-CM | POA: Diagnosis not present

## 2024-04-20 DIAGNOSIS — J45909 Unspecified asthma, uncomplicated: Secondary | ICD-10-CM | POA: Diagnosis not present

## 2024-04-20 DIAGNOSIS — R4189 Other symptoms and signs involving cognitive functions and awareness: Secondary | ICD-10-CM | POA: Diagnosis not present

## 2024-04-20 DIAGNOSIS — F419 Anxiety disorder, unspecified: Secondary | ICD-10-CM | POA: Diagnosis not present

## 2024-04-20 DIAGNOSIS — J309 Allergic rhinitis, unspecified: Secondary | ICD-10-CM | POA: Diagnosis not present

## 2024-04-20 DIAGNOSIS — B961 Klebsiella pneumoniae [K. pneumoniae] as the cause of diseases classified elsewhere: Secondary | ICD-10-CM | POA: Diagnosis not present

## 2024-04-20 DIAGNOSIS — R296 Repeated falls: Secondary | ICD-10-CM | POA: Diagnosis not present

## 2024-04-20 DIAGNOSIS — B9620 Unspecified Escherichia coli [E. coli] as the cause of diseases classified elsewhere: Secondary | ICD-10-CM | POA: Diagnosis not present

## 2024-04-20 DIAGNOSIS — K219 Gastro-esophageal reflux disease without esophagitis: Secondary | ICD-10-CM | POA: Diagnosis not present

## 2024-04-20 DIAGNOSIS — N3281 Overactive bladder: Secondary | ICD-10-CM | POA: Diagnosis not present

## 2024-04-20 DIAGNOSIS — Z87891 Personal history of nicotine dependence: Secondary | ICD-10-CM | POA: Diagnosis not present

## 2024-04-20 DIAGNOSIS — F32A Depression, unspecified: Secondary | ICD-10-CM | POA: Diagnosis not present

## 2024-04-20 DIAGNOSIS — E049 Nontoxic goiter, unspecified: Secondary | ICD-10-CM | POA: Diagnosis not present

## 2024-04-20 DIAGNOSIS — E1140 Type 2 diabetes mellitus with diabetic neuropathy, unspecified: Secondary | ICD-10-CM | POA: Diagnosis not present

## 2024-04-20 DIAGNOSIS — E785 Hyperlipidemia, unspecified: Secondary | ICD-10-CM | POA: Diagnosis not present

## 2024-04-20 DIAGNOSIS — E039 Hypothyroidism, unspecified: Secondary | ICD-10-CM | POA: Diagnosis not present

## 2024-04-20 DIAGNOSIS — I10 Essential (primary) hypertension: Secondary | ICD-10-CM | POA: Diagnosis not present

## 2024-04-20 DIAGNOSIS — M549 Dorsalgia, unspecified: Secondary | ICD-10-CM | POA: Diagnosis not present

## 2024-04-20 DIAGNOSIS — R32 Unspecified urinary incontinence: Secondary | ICD-10-CM | POA: Diagnosis not present

## 2024-04-20 DIAGNOSIS — M79601 Pain in right arm: Secondary | ICD-10-CM | POA: Diagnosis not present

## 2024-04-20 DIAGNOSIS — M1990 Unspecified osteoarthritis, unspecified site: Secondary | ICD-10-CM | POA: Diagnosis not present

## 2024-04-20 DIAGNOSIS — N390 Urinary tract infection, site not specified: Secondary | ICD-10-CM | POA: Diagnosis not present

## 2024-04-20 DIAGNOSIS — A419 Sepsis, unspecified organism: Secondary | ICD-10-CM | POA: Diagnosis not present

## 2024-04-20 NOTE — Telephone Encounter (Signed)
 Scheduled for 04/23/24 but can come earlier if we have openings  Zilretta  authorized for BILAT knee   Primary Insurance: BC[employer] Medicare Co-Pay : $20 Co-Insurance: 20% OOP max $3150 has met $2459.83 Deductible: does not apply  Prior Auth: NOT required  Reference number [id number]

## 2024-04-20 NOTE — Telephone Encounter (Signed)
 Noted.

## 2024-04-20 NOTE — Telephone Encounter (Signed)
 Approved.  See other phone note.

## 2024-04-22 NOTE — Progress Notes (Unsigned)
 I, Leone Ralphs am a scribe for Dr. Alease Hunter.    Alexandra Wolfe is a 78 y.o. female who presents to [facility] today for cont'd bilat knee pain. Pt was last seen by Dr. Alease Hunter on 01/09/24 and was given repeat bilat Zilretta  injections.  Today, pt reports knees hurt really bad today and would like the injections today.  Dx imaging: 12/04/22 R & L knee   12/23/21 R & L knee XR             03/02/20 R & L knee XR   Pertinent review of systems: No fevers or chills  Relevant historical information: Mild cognitive impairment.  History of a TBI. Spinal stenosis lumbar spine   Exam:  BP 110/60   Pulse 99   Ht 5\' 6"  (1.676 m)   SpO2 96%   BMI 35.05 kg/m  General: Well Developed, well nourished, and in no acute distress.   MSK: Knees bilaterally moderate effusion normal motion with crepitation.    Lab and Radiology Results   Zilretta  injection bilateral knee Procedure: Real-time Ultrasound Guided Injection of right knee joint superior lateral patellar space Device: Philips Affiniti 50G Images permanently stored and available for review in PACS Verbal informed consent obtained.  Discussed risks and benefits of procedure. Warned about infection, hyperglycemia bleeding, damage to structures among others. Patient expresses understanding and agreement Time-out conducted.   Noted no overlying erythema, induration, or other signs of local infection.   Skin prepped in a sterile fashion.   Local anesthesia: Topical Ethyl chloride.   With sterile technique and under real time ultrasound guidance: Zilretta  32 mg injected into knee joint. Fluid seen entering the joint capsule.   Completed without difficulty   Advised to call if fevers/chills, erythema, induration, drainage, or persistent bleeding.   Images permanently stored and available for review in the ultrasound unit.  Impression: Technically successful ultrasound guided injection.   Procedure:  Real-time Ultrasound Guided Injection of left knee joint superior lateral patellar space Device: Philips Affiniti 50G Images permanently stored and available for review in PACS Verbal informed consent obtained.  Discussed risks and benefits of procedure. Warned about infection, hyperglycemia bleeding, damage to structures among others. Patient expresses understanding and agreement Time-out conducted.   Noted no overlying erythema, induration, or other signs of local infection.   Skin prepped in a sterile fashion.   Local anesthesia: Topical Ethyl chloride.   With sterile technique and under real time ultrasound guidance: Zilretta  32 mg injected into knee joint. Fluid seen entering the joint capsule.   Completed without difficulty   Advised to call if fevers/chills, erythema, induration, drainage, or persistent bleeding.   Images permanently stored and available for review in the ultrasound unit.  Impression: Technically successful ultrasound guided injection.  Lot number: [id number] for both injections     Assessment and Plan: 78 y.o. female with bilateral knee pain due to DJD.  Plan for repeat Zilretta  injection.  She has been getting these injections about every 3 months.  In 3 months I expect to be out office on leave for about a month.  Recommend that she schedule with my partner Dr. Cleora Daft to anticipate Zilretta  injection in about 3 months. Could proceed to genicular artery embolization if needed if these injections are no longer helpful.   PDMP not reviewed this encounter. Orders Placed This Encounter  Procedures   US  LIMITED JOINT SPACE STRUCTURES LOW BILAT(NO LINKED CHARGES)    Reason for Exam (SYMPTOM  OR DIAGNOSIS REQUIRED):   knee pain    Preferred imaging location?:   [facility] ordered this encounter  Medications   Triamcinolone  Acetonide (ZILRETTA ) intra-articular injection 32 mg   Triamcinolone  Acetonide (ZILRETTA ) intra-articular  injection 32 mg     Discussed warning signs or symptoms. Please see discharge instructions. Patient expresses understanding.   The above documentation has been reviewed and is accurate and complete Garlan Juniper, M.D.

## 2024-04-23 ENCOUNTER — Ambulatory Visit: Admitting: Family Medicine

## 2024-04-23 ENCOUNTER — Telehealth: Payer: Self-pay | Admitting: *Deleted

## 2024-04-23 ENCOUNTER — Other Ambulatory Visit: Payer: Self-pay

## 2024-04-23 VITALS — BP 110/60 | HR 99 | Ht 66.0 in

## 2024-04-23 DIAGNOSIS — M25562 Pain in left knee: Secondary | ICD-10-CM

## 2024-04-23 DIAGNOSIS — M25561 Pain in right knee: Secondary | ICD-10-CM

## 2024-04-23 DIAGNOSIS — M170 Bilateral primary osteoarthritis of knee: Secondary | ICD-10-CM

## 2024-04-23 DIAGNOSIS — G8929 Other chronic pain: Secondary | ICD-10-CM

## 2024-04-23 MED ORDER — TRIAMCINOLONE ACETONIDE 32 MG IX SRER
32.0000 mg | Freq: Once | INTRA_ARTICULAR | Status: AC
  Administered 2024-04-23: 32 mg via INTRA_ARTICULAR

## 2024-04-23 NOTE — Patient Instructions (Signed)
 Thank you for coming in today.   Call or go to the ER if you develop a large red swollen joint with extreme pain or oozing puss.    Consider genicular artery embolization if needed.  Schedule with Dr. Cleora Daft in 3 months.  Make sure to contact my office before that visit to make sure that we have the Zilretta  authorized.

## 2024-04-23 NOTE — Progress Notes (Signed)
 Complex Care Management Note  Care Guide Note 04/23/2024 Name: Alexandra Wolfe MRN: [id number] DOB: 08/23/1946  Alexandra Wolfe is a 78 y.o. year old female who sees Roslyn Coombe, MD for primary care. I reached out to Amos Balint by phone today to offer complex care management services.  [name] was given information about Complex Care Management services today including:   The Complex Care Management services include support from the care team which includes your Nurse Care Manager, Clinical Social Worker, or Pharmacist.  The Complex Care Management team is here to help remove barriers to the health concerns and goals most important to you. Complex Care Management services are voluntary, and the patient may decline or stop services at any time by request to their care team member.   Complex Care Management Consent Status: Patient did not agree to participate in complex care management services at this time.   Encounter Outcome:  Patient Refused  Kandis Ormond, CMA [place]  [facility], [facility] Guide Direct Dial: [phone]  Fax: [phone] Website: [email or web address]

## 2024-04-27 ENCOUNTER — Inpatient Hospital Stay: Admission: RE | Admit: 2024-04-27 | Source: Ambulatory Visit

## 2024-04-27 DIAGNOSIS — M1990 Unspecified osteoarthritis, unspecified site: Secondary | ICD-10-CM | POA: Diagnosis not present

## 2024-04-27 DIAGNOSIS — R296 Repeated falls: Secondary | ICD-10-CM | POA: Diagnosis not present

## 2024-04-27 DIAGNOSIS — I10 Essential (primary) hypertension: Secondary | ICD-10-CM | POA: Diagnosis not present

## 2024-04-27 DIAGNOSIS — E049 Nontoxic goiter, unspecified: Secondary | ICD-10-CM | POA: Diagnosis not present

## 2024-04-27 DIAGNOSIS — E039 Hypothyroidism, unspecified: Secondary | ICD-10-CM | POA: Diagnosis not present

## 2024-04-27 DIAGNOSIS — F419 Anxiety disorder, unspecified: Secondary | ICD-10-CM | POA: Diagnosis not present

## 2024-04-27 DIAGNOSIS — M79601 Pain in right arm: Secondary | ICD-10-CM | POA: Diagnosis not present

## 2024-04-27 DIAGNOSIS — J45909 Unspecified asthma, uncomplicated: Secondary | ICD-10-CM | POA: Diagnosis not present

## 2024-04-27 DIAGNOSIS — A419 Sepsis, unspecified organism: Secondary | ICD-10-CM | POA: Diagnosis not present

## 2024-04-27 DIAGNOSIS — R32 Unspecified urinary incontinence: Secondary | ICD-10-CM | POA: Diagnosis not present

## 2024-04-27 DIAGNOSIS — B961 Klebsiella pneumoniae [K. pneumoniae] as the cause of diseases classified elsewhere: Secondary | ICD-10-CM | POA: Diagnosis not present

## 2024-04-27 DIAGNOSIS — B9620 Unspecified Escherichia coli [E. coli] as the cause of diseases classified elsewhere: Secondary | ICD-10-CM | POA: Diagnosis not present

## 2024-04-27 DIAGNOSIS — R4189 Other symptoms and signs involving cognitive functions and awareness: Secondary | ICD-10-CM | POA: Diagnosis not present

## 2024-04-27 DIAGNOSIS — Z87891 Personal history of nicotine dependence: Secondary | ICD-10-CM | POA: Diagnosis not present

## 2024-04-27 DIAGNOSIS — N3281 Overactive bladder: Secondary | ICD-10-CM | POA: Diagnosis not present

## 2024-04-27 DIAGNOSIS — E785 Hyperlipidemia, unspecified: Secondary | ICD-10-CM | POA: Diagnosis not present

## 2024-04-27 DIAGNOSIS — E1140 Type 2 diabetes mellitus with diabetic neuropathy, unspecified: Secondary | ICD-10-CM | POA: Diagnosis not present

## 2024-04-27 DIAGNOSIS — K219 Gastro-esophageal reflux disease without esophagitis: Secondary | ICD-10-CM | POA: Diagnosis not present

## 2024-04-27 DIAGNOSIS — M549 Dorsalgia, unspecified: Secondary | ICD-10-CM | POA: Diagnosis not present

## 2024-04-27 DIAGNOSIS — K5900 Constipation, unspecified: Secondary | ICD-10-CM | POA: Diagnosis not present

## 2024-04-27 DIAGNOSIS — F32A Depression, unspecified: Secondary | ICD-10-CM | POA: Diagnosis not present

## 2024-04-27 DIAGNOSIS — N390 Urinary tract infection, site not specified: Secondary | ICD-10-CM | POA: Diagnosis not present

## 2024-04-27 DIAGNOSIS — J309 Allergic rhinitis, unspecified: Secondary | ICD-10-CM | POA: Diagnosis not present

## 2024-04-28 DIAGNOSIS — N390 Urinary tract infection, site not specified: Secondary | ICD-10-CM | POA: Diagnosis not present

## 2024-04-28 DIAGNOSIS — E1140 Type 2 diabetes mellitus with diabetic neuropathy, unspecified: Secondary | ICD-10-CM | POA: Diagnosis not present

## 2024-04-28 DIAGNOSIS — B961 Klebsiella pneumoniae [K. pneumoniae] as the cause of diseases classified elsewhere: Secondary | ICD-10-CM | POA: Diagnosis not present

## 2024-04-28 DIAGNOSIS — B9620 Unspecified Escherichia coli [E. coli] as the cause of diseases classified elsewhere: Secondary | ICD-10-CM | POA: Diagnosis not present

## 2024-04-28 DIAGNOSIS — R32 Unspecified urinary incontinence: Secondary | ICD-10-CM | POA: Diagnosis not present

## 2024-04-28 DIAGNOSIS — R296 Repeated falls: Secondary | ICD-10-CM | POA: Diagnosis not present

## 2024-04-28 DIAGNOSIS — R4189 Other symptoms and signs involving cognitive functions and awareness: Secondary | ICD-10-CM | POA: Diagnosis not present

## 2024-04-28 DIAGNOSIS — E785 Hyperlipidemia, unspecified: Secondary | ICD-10-CM | POA: Diagnosis not present

## 2024-04-28 DIAGNOSIS — M1990 Unspecified osteoarthritis, unspecified site: Secondary | ICD-10-CM | POA: Diagnosis not present

## 2024-04-28 DIAGNOSIS — Z87891 Personal history of nicotine dependence: Secondary | ICD-10-CM | POA: Diagnosis not present

## 2024-04-28 DIAGNOSIS — K219 Gastro-esophageal reflux disease without esophagitis: Secondary | ICD-10-CM | POA: Diagnosis not present

## 2024-04-28 DIAGNOSIS — M549 Dorsalgia, unspecified: Secondary | ICD-10-CM | POA: Diagnosis not present

## 2024-04-28 DIAGNOSIS — J309 Allergic rhinitis, unspecified: Secondary | ICD-10-CM | POA: Diagnosis not present

## 2024-04-28 DIAGNOSIS — I10 Essential (primary) hypertension: Secondary | ICD-10-CM | POA: Diagnosis not present

## 2024-04-28 DIAGNOSIS — N3281 Overactive bladder: Secondary | ICD-10-CM | POA: Diagnosis not present

## 2024-04-28 DIAGNOSIS — J45909 Unspecified asthma, uncomplicated: Secondary | ICD-10-CM | POA: Diagnosis not present

## 2024-04-28 DIAGNOSIS — E049 Nontoxic goiter, unspecified: Secondary | ICD-10-CM | POA: Diagnosis not present

## 2024-04-28 DIAGNOSIS — F419 Anxiety disorder, unspecified: Secondary | ICD-10-CM | POA: Diagnosis not present

## 2024-04-28 DIAGNOSIS — E039 Hypothyroidism, unspecified: Secondary | ICD-10-CM | POA: Diagnosis not present

## 2024-04-28 DIAGNOSIS — M79601 Pain in right arm: Secondary | ICD-10-CM | POA: Diagnosis not present

## 2024-04-28 DIAGNOSIS — A419 Sepsis, unspecified organism: Secondary | ICD-10-CM | POA: Diagnosis not present

## 2024-04-28 DIAGNOSIS — K5900 Constipation, unspecified: Secondary | ICD-10-CM | POA: Diagnosis not present

## 2024-04-28 DIAGNOSIS — F32A Depression, unspecified: Secondary | ICD-10-CM | POA: Diagnosis not present

## 2024-04-29 DIAGNOSIS — F419 Anxiety disorder, unspecified: Secondary | ICD-10-CM | POA: Diagnosis not present

## 2024-04-29 DIAGNOSIS — Z7982 Long term (current) use of aspirin: Secondary | ICD-10-CM | POA: Diagnosis not present

## 2024-05-04 DIAGNOSIS — E785 Hyperlipidemia, unspecified: Secondary | ICD-10-CM | POA: Diagnosis not present

## 2024-05-04 DIAGNOSIS — R296 Repeated falls: Secondary | ICD-10-CM | POA: Diagnosis not present

## 2024-05-04 DIAGNOSIS — N3281 Overactive bladder: Secondary | ICD-10-CM | POA: Diagnosis not present

## 2024-05-04 DIAGNOSIS — N390 Urinary tract infection, site not specified: Secondary | ICD-10-CM | POA: Diagnosis not present

## 2024-05-04 DIAGNOSIS — B9620 Unspecified Escherichia coli [E. coli] as the cause of diseases classified elsewhere: Secondary | ICD-10-CM | POA: Diagnosis not present

## 2024-05-04 DIAGNOSIS — A419 Sepsis, unspecified organism: Secondary | ICD-10-CM | POA: Diagnosis not present

## 2024-05-04 DIAGNOSIS — K5900 Constipation, unspecified: Secondary | ICD-10-CM | POA: Diagnosis not present

## 2024-05-04 DIAGNOSIS — R4189 Other symptoms and signs involving cognitive functions and awareness: Secondary | ICD-10-CM | POA: Diagnosis not present

## 2024-05-04 DIAGNOSIS — I10 Essential (primary) hypertension: Secondary | ICD-10-CM | POA: Diagnosis not present

## 2024-05-04 DIAGNOSIS — B961 Klebsiella pneumoniae [K. pneumoniae] as the cause of diseases classified elsewhere: Secondary | ICD-10-CM | POA: Diagnosis not present

## 2024-05-04 DIAGNOSIS — M1990 Unspecified osteoarthritis, unspecified site: Secondary | ICD-10-CM | POA: Diagnosis not present

## 2024-05-04 DIAGNOSIS — J309 Allergic rhinitis, unspecified: Secondary | ICD-10-CM | POA: Diagnosis not present

## 2024-05-04 DIAGNOSIS — R32 Unspecified urinary incontinence: Secondary | ICD-10-CM | POA: Diagnosis not present

## 2024-05-04 DIAGNOSIS — F32A Depression, unspecified: Secondary | ICD-10-CM | POA: Diagnosis not present

## 2024-05-04 DIAGNOSIS — E049 Nontoxic goiter, unspecified: Secondary | ICD-10-CM | POA: Diagnosis not present

## 2024-05-04 DIAGNOSIS — J45909 Unspecified asthma, uncomplicated: Secondary | ICD-10-CM | POA: Diagnosis not present

## 2024-05-04 DIAGNOSIS — E039 Hypothyroidism, unspecified: Secondary | ICD-10-CM | POA: Diagnosis not present

## 2024-05-04 DIAGNOSIS — E1140 Type 2 diabetes mellitus with diabetic neuropathy, unspecified: Secondary | ICD-10-CM | POA: Diagnosis not present

## 2024-05-04 DIAGNOSIS — M549 Dorsalgia, unspecified: Secondary | ICD-10-CM | POA: Diagnosis not present

## 2024-05-04 DIAGNOSIS — Z87891 Personal history of nicotine dependence: Secondary | ICD-10-CM | POA: Diagnosis not present

## 2024-05-04 DIAGNOSIS — M79601 Pain in right arm: Secondary | ICD-10-CM | POA: Diagnosis not present

## 2024-05-04 DIAGNOSIS — K219 Gastro-esophageal reflux disease without esophagitis: Secondary | ICD-10-CM | POA: Diagnosis not present

## 2024-05-04 DIAGNOSIS — F419 Anxiety disorder, unspecified: Secondary | ICD-10-CM | POA: Diagnosis not present

## 2024-05-06 ENCOUNTER — Other Ambulatory Visit: Payer: Self-pay | Admitting: Internal Medicine

## 2024-05-06 MED ORDER — GABAPENTIN 100 MG PO CAPS: ORAL_CAPSULE | ORAL | 1 refills | Status: DC

## 2024-05-06 NOTE — Telephone Encounter (Signed)
 Copied from CRM [id number]. Topic: Clinical - Prescription Issue >> May 06, 2024 10:31 AM Alexandra Wolfe D wrote: Reason for CRM: gabapentin  (NEURONTIN ) 100 MG capsule: Patient previously had [facility] pharmacy for this medication but had switched to [facility]. She stated that [facility] told her they transferred the medication to [facility] for her but she said [facility] had not received it. She is wondering if it could be resent.

## 2024-05-06 NOTE — Telephone Encounter (Signed)
Ok this was done  thanks

## 2024-05-07 DIAGNOSIS — J45909 Unspecified asthma, uncomplicated: Secondary | ICD-10-CM | POA: Diagnosis not present

## 2024-05-07 DIAGNOSIS — E1140 Type 2 diabetes mellitus with diabetic neuropathy, unspecified: Secondary | ICD-10-CM | POA: Diagnosis not present

## 2024-05-07 DIAGNOSIS — M1990 Unspecified osteoarthritis, unspecified site: Secondary | ICD-10-CM | POA: Diagnosis not present

## 2024-05-07 DIAGNOSIS — R4189 Other symptoms and signs involving cognitive functions and awareness: Secondary | ICD-10-CM | POA: Diagnosis not present

## 2024-05-07 DIAGNOSIS — E785 Hyperlipidemia, unspecified: Secondary | ICD-10-CM | POA: Diagnosis not present

## 2024-05-07 DIAGNOSIS — E049 Nontoxic goiter, unspecified: Secondary | ICD-10-CM | POA: Diagnosis not present

## 2024-05-07 DIAGNOSIS — Z87891 Personal history of nicotine dependence: Secondary | ICD-10-CM | POA: Diagnosis not present

## 2024-05-07 DIAGNOSIS — K219 Gastro-esophageal reflux disease without esophagitis: Secondary | ICD-10-CM | POA: Diagnosis not present

## 2024-05-07 DIAGNOSIS — B961 Klebsiella pneumoniae [K. pneumoniae] as the cause of diseases classified elsewhere: Secondary | ICD-10-CM | POA: Diagnosis not present

## 2024-05-07 DIAGNOSIS — M549 Dorsalgia, unspecified: Secondary | ICD-10-CM | POA: Diagnosis not present

## 2024-05-07 DIAGNOSIS — F419 Anxiety disorder, unspecified: Secondary | ICD-10-CM | POA: Diagnosis not present

## 2024-05-07 DIAGNOSIS — J309 Allergic rhinitis, unspecified: Secondary | ICD-10-CM | POA: Diagnosis not present

## 2024-05-07 DIAGNOSIS — N3281 Overactive bladder: Secondary | ICD-10-CM | POA: Diagnosis not present

## 2024-05-07 DIAGNOSIS — A419 Sepsis, unspecified organism: Secondary | ICD-10-CM | POA: Diagnosis not present

## 2024-05-07 DIAGNOSIS — M79601 Pain in right arm: Secondary | ICD-10-CM | POA: Diagnosis not present

## 2024-05-07 DIAGNOSIS — N390 Urinary tract infection, site not specified: Secondary | ICD-10-CM | POA: Diagnosis not present

## 2024-05-07 DIAGNOSIS — B9620 Unspecified Escherichia coli [E. coli] as the cause of diseases classified elsewhere: Secondary | ICD-10-CM | POA: Diagnosis not present

## 2024-05-07 DIAGNOSIS — R32 Unspecified urinary incontinence: Secondary | ICD-10-CM | POA: Diagnosis not present

## 2024-05-07 DIAGNOSIS — R296 Repeated falls: Secondary | ICD-10-CM | POA: Diagnosis not present

## 2024-05-07 DIAGNOSIS — F32A Depression, unspecified: Secondary | ICD-10-CM | POA: Diagnosis not present

## 2024-05-07 DIAGNOSIS — I10 Essential (primary) hypertension: Secondary | ICD-10-CM | POA: Diagnosis not present

## 2024-05-07 DIAGNOSIS — K5900 Constipation, unspecified: Secondary | ICD-10-CM | POA: Diagnosis not present

## 2024-05-07 DIAGNOSIS — E039 Hypothyroidism, unspecified: Secondary | ICD-10-CM | POA: Diagnosis not present

## 2024-05-08 DIAGNOSIS — Z87891 Personal history of nicotine dependence: Secondary | ICD-10-CM | POA: Diagnosis not present

## 2024-05-08 DIAGNOSIS — Z6835 Body mass index (BMI) 35.0-35.9, adult: Secondary | ICD-10-CM | POA: Diagnosis not present

## 2024-05-08 DIAGNOSIS — M1990 Unspecified osteoarthritis, unspecified site: Secondary | ICD-10-CM | POA: Diagnosis not present

## 2024-05-08 DIAGNOSIS — Z791 Long term (current) use of non-steroidal anti-inflammatories (NSAID): Secondary | ICD-10-CM | POA: Diagnosis not present

## 2024-05-08 DIAGNOSIS — E039 Hypothyroidism, unspecified: Secondary | ICD-10-CM | POA: Diagnosis not present

## 2024-05-08 DIAGNOSIS — I10 Essential (primary) hypertension: Secondary | ICD-10-CM | POA: Diagnosis not present

## 2024-05-08 DIAGNOSIS — R296 Repeated falls: Secondary | ICD-10-CM | POA: Diagnosis not present

## 2024-05-08 DIAGNOSIS — Z7982 Long term (current) use of aspirin: Secondary | ICD-10-CM | POA: Diagnosis not present

## 2024-05-08 DIAGNOSIS — K219 Gastro-esophageal reflux disease without esophagitis: Secondary | ICD-10-CM | POA: Diagnosis not present

## 2024-05-08 DIAGNOSIS — E785 Hyperlipidemia, unspecified: Secondary | ICD-10-CM | POA: Diagnosis not present

## 2024-05-08 DIAGNOSIS — Z87440 Personal history of urinary (tract) infections: Secondary | ICD-10-CM | POA: Diagnosis not present

## 2024-05-08 DIAGNOSIS — F32A Depression, unspecified: Secondary | ICD-10-CM | POA: Diagnosis not present

## 2024-05-08 DIAGNOSIS — Z9181 History of falling: Secondary | ICD-10-CM | POA: Diagnosis not present

## 2024-05-08 DIAGNOSIS — F419 Anxiety disorder, unspecified: Secondary | ICD-10-CM | POA: Diagnosis not present

## 2024-05-08 DIAGNOSIS — Z7984 Long term (current) use of oral hypoglycemic drugs: Secondary | ICD-10-CM | POA: Diagnosis not present

## 2024-05-08 DIAGNOSIS — M79601 Pain in right arm: Secondary | ICD-10-CM | POA: Diagnosis not present

## 2024-05-08 DIAGNOSIS — E049 Nontoxic goiter, unspecified: Secondary | ICD-10-CM | POA: Diagnosis not present

## 2024-05-08 DIAGNOSIS — K5900 Constipation, unspecified: Secondary | ICD-10-CM | POA: Diagnosis not present

## 2024-05-08 DIAGNOSIS — N3281 Overactive bladder: Secondary | ICD-10-CM | POA: Diagnosis not present

## 2024-05-08 DIAGNOSIS — R32 Unspecified urinary incontinence: Secondary | ICD-10-CM | POA: Diagnosis not present

## 2024-05-08 DIAGNOSIS — Z556 Problems related to health literacy: Secondary | ICD-10-CM | POA: Diagnosis not present

## 2024-05-08 DIAGNOSIS — J45909 Unspecified asthma, uncomplicated: Secondary | ICD-10-CM | POA: Diagnosis not present

## 2024-05-08 DIAGNOSIS — E1140 Type 2 diabetes mellitus with diabetic neuropathy, unspecified: Secondary | ICD-10-CM | POA: Diagnosis not present

## 2024-05-08 DIAGNOSIS — Z87820 Personal history of traumatic brain injury: Secondary | ICD-10-CM | POA: Diagnosis not present

## 2024-05-11 DIAGNOSIS — K219 Gastro-esophageal reflux disease without esophagitis: Secondary | ICD-10-CM | POA: Diagnosis not present

## 2024-05-11 DIAGNOSIS — Z87440 Personal history of urinary (tract) infections: Secondary | ICD-10-CM | POA: Diagnosis not present

## 2024-05-11 DIAGNOSIS — Z87820 Personal history of traumatic brain injury: Secondary | ICD-10-CM | POA: Diagnosis not present

## 2024-05-11 DIAGNOSIS — Z556 Problems related to health literacy: Secondary | ICD-10-CM | POA: Diagnosis not present

## 2024-05-11 DIAGNOSIS — Z7984 Long term (current) use of oral hypoglycemic drugs: Secondary | ICD-10-CM | POA: Diagnosis not present

## 2024-05-11 DIAGNOSIS — Z9181 History of falling: Secondary | ICD-10-CM | POA: Diagnosis not present

## 2024-05-11 DIAGNOSIS — E049 Nontoxic goiter, unspecified: Secondary | ICD-10-CM | POA: Diagnosis not present

## 2024-05-11 DIAGNOSIS — J45909 Unspecified asthma, uncomplicated: Secondary | ICD-10-CM | POA: Diagnosis not present

## 2024-05-11 DIAGNOSIS — Z6835 Body mass index (BMI) 35.0-35.9, adult: Secondary | ICD-10-CM | POA: Diagnosis not present

## 2024-05-11 DIAGNOSIS — E039 Hypothyroidism, unspecified: Secondary | ICD-10-CM | POA: Diagnosis not present

## 2024-05-11 DIAGNOSIS — R296 Repeated falls: Secondary | ICD-10-CM | POA: Diagnosis not present

## 2024-05-11 DIAGNOSIS — M79601 Pain in right arm: Secondary | ICD-10-CM | POA: Diagnosis not present

## 2024-05-11 DIAGNOSIS — N3281 Overactive bladder: Secondary | ICD-10-CM | POA: Diagnosis not present

## 2024-05-11 DIAGNOSIS — K5900 Constipation, unspecified: Secondary | ICD-10-CM | POA: Diagnosis not present

## 2024-05-11 DIAGNOSIS — Z791 Long term (current) use of non-steroidal anti-inflammatories (NSAID): Secondary | ICD-10-CM | POA: Diagnosis not present

## 2024-05-11 DIAGNOSIS — R32 Unspecified urinary incontinence: Secondary | ICD-10-CM | POA: Diagnosis not present

## 2024-05-11 DIAGNOSIS — F419 Anxiety disorder, unspecified: Secondary | ICD-10-CM | POA: Diagnosis not present

## 2024-05-11 DIAGNOSIS — Z7982 Long term (current) use of aspirin: Secondary | ICD-10-CM | POA: Diagnosis not present

## 2024-05-11 DIAGNOSIS — E785 Hyperlipidemia, unspecified: Secondary | ICD-10-CM | POA: Diagnosis not present

## 2024-05-11 DIAGNOSIS — Z87891 Personal history of nicotine dependence: Secondary | ICD-10-CM | POA: Diagnosis not present

## 2024-05-11 DIAGNOSIS — F32A Depression, unspecified: Secondary | ICD-10-CM | POA: Diagnosis not present

## 2024-05-11 DIAGNOSIS — I10 Essential (primary) hypertension: Secondary | ICD-10-CM | POA: Diagnosis not present

## 2024-05-11 DIAGNOSIS — E1140 Type 2 diabetes mellitus with diabetic neuropathy, unspecified: Secondary | ICD-10-CM | POA: Diagnosis not present

## 2024-05-11 DIAGNOSIS — M1990 Unspecified osteoarthritis, unspecified site: Secondary | ICD-10-CM | POA: Diagnosis not present

## 2024-05-13 DIAGNOSIS — Z7984 Long term (current) use of oral hypoglycemic drugs: Secondary | ICD-10-CM | POA: Diagnosis not present

## 2024-05-13 DIAGNOSIS — Z556 Problems related to health literacy: Secondary | ICD-10-CM | POA: Diagnosis not present

## 2024-05-13 DIAGNOSIS — E785 Hyperlipidemia, unspecified: Secondary | ICD-10-CM | POA: Diagnosis not present

## 2024-05-13 DIAGNOSIS — Z6835 Body mass index (BMI) 35.0-35.9, adult: Secondary | ICD-10-CM | POA: Diagnosis not present

## 2024-05-13 DIAGNOSIS — E039 Hypothyroidism, unspecified: Secondary | ICD-10-CM | POA: Diagnosis not present

## 2024-05-13 DIAGNOSIS — Z87891 Personal history of nicotine dependence: Secondary | ICD-10-CM | POA: Diagnosis not present

## 2024-05-13 DIAGNOSIS — E049 Nontoxic goiter, unspecified: Secondary | ICD-10-CM | POA: Diagnosis not present

## 2024-05-13 DIAGNOSIS — Z9181 History of falling: Secondary | ICD-10-CM | POA: Diagnosis not present

## 2024-05-13 DIAGNOSIS — Z87820 Personal history of traumatic brain injury: Secondary | ICD-10-CM | POA: Diagnosis not present

## 2024-05-13 DIAGNOSIS — R296 Repeated falls: Secondary | ICD-10-CM | POA: Diagnosis not present

## 2024-05-13 DIAGNOSIS — M79601 Pain in right arm: Secondary | ICD-10-CM | POA: Diagnosis not present

## 2024-05-13 DIAGNOSIS — N3281 Overactive bladder: Secondary | ICD-10-CM | POA: Diagnosis not present

## 2024-05-13 DIAGNOSIS — F32A Depression, unspecified: Secondary | ICD-10-CM | POA: Diagnosis not present

## 2024-05-13 DIAGNOSIS — R32 Unspecified urinary incontinence: Secondary | ICD-10-CM | POA: Diagnosis not present

## 2024-05-13 DIAGNOSIS — F419 Anxiety disorder, unspecified: Secondary | ICD-10-CM | POA: Diagnosis not present

## 2024-05-13 DIAGNOSIS — M1990 Unspecified osteoarthritis, unspecified site: Secondary | ICD-10-CM | POA: Diagnosis not present

## 2024-05-13 DIAGNOSIS — Z87440 Personal history of urinary (tract) infections: Secondary | ICD-10-CM | POA: Diagnosis not present

## 2024-05-13 DIAGNOSIS — I10 Essential (primary) hypertension: Secondary | ICD-10-CM | POA: Diagnosis not present

## 2024-05-13 DIAGNOSIS — K219 Gastro-esophageal reflux disease without esophagitis: Secondary | ICD-10-CM | POA: Diagnosis not present

## 2024-05-13 DIAGNOSIS — Z7982 Long term (current) use of aspirin: Secondary | ICD-10-CM | POA: Diagnosis not present

## 2024-05-13 DIAGNOSIS — K5900 Constipation, unspecified: Secondary | ICD-10-CM | POA: Diagnosis not present

## 2024-05-13 DIAGNOSIS — J45909 Unspecified asthma, uncomplicated: Secondary | ICD-10-CM | POA: Diagnosis not present

## 2024-05-13 DIAGNOSIS — Z791 Long term (current) use of non-steroidal anti-inflammatories (NSAID): Secondary | ICD-10-CM | POA: Diagnosis not present

## 2024-05-13 DIAGNOSIS — E1140 Type 2 diabetes mellitus with diabetic neuropathy, unspecified: Secondary | ICD-10-CM | POA: Diagnosis not present

## 2024-05-18 DIAGNOSIS — Z9181 History of falling: Secondary | ICD-10-CM | POA: Diagnosis not present

## 2024-05-18 DIAGNOSIS — F32A Depression, unspecified: Secondary | ICD-10-CM | POA: Diagnosis not present

## 2024-05-18 DIAGNOSIS — E049 Nontoxic goiter, unspecified: Secondary | ICD-10-CM | POA: Diagnosis not present

## 2024-05-18 DIAGNOSIS — Z556 Problems related to health literacy: Secondary | ICD-10-CM | POA: Diagnosis not present

## 2024-05-18 DIAGNOSIS — J45909 Unspecified asthma, uncomplicated: Secondary | ICD-10-CM | POA: Diagnosis not present

## 2024-05-18 DIAGNOSIS — Z87891 Personal history of nicotine dependence: Secondary | ICD-10-CM | POA: Diagnosis not present

## 2024-05-18 DIAGNOSIS — I10 Essential (primary) hypertension: Secondary | ICD-10-CM | POA: Diagnosis not present

## 2024-05-18 DIAGNOSIS — Z7982 Long term (current) use of aspirin: Secondary | ICD-10-CM | POA: Diagnosis not present

## 2024-05-18 DIAGNOSIS — Z791 Long term (current) use of non-steroidal anti-inflammatories (NSAID): Secondary | ICD-10-CM | POA: Diagnosis not present

## 2024-05-18 DIAGNOSIS — E039 Hypothyroidism, unspecified: Secondary | ICD-10-CM | POA: Diagnosis not present

## 2024-05-18 DIAGNOSIS — E785 Hyperlipidemia, unspecified: Secondary | ICD-10-CM | POA: Diagnosis not present

## 2024-05-18 DIAGNOSIS — M79601 Pain in right arm: Secondary | ICD-10-CM | POA: Diagnosis not present

## 2024-05-18 DIAGNOSIS — K5900 Constipation, unspecified: Secondary | ICD-10-CM | POA: Diagnosis not present

## 2024-05-18 DIAGNOSIS — K219 Gastro-esophageal reflux disease without esophagitis: Secondary | ICD-10-CM | POA: Diagnosis not present

## 2024-05-18 DIAGNOSIS — Z87820 Personal history of traumatic brain injury: Secondary | ICD-10-CM | POA: Diagnosis not present

## 2024-05-18 DIAGNOSIS — Z6835 Body mass index (BMI) 35.0-35.9, adult: Secondary | ICD-10-CM | POA: Diagnosis not present

## 2024-05-18 DIAGNOSIS — Z7984 Long term (current) use of oral hypoglycemic drugs: Secondary | ICD-10-CM | POA: Diagnosis not present

## 2024-05-18 DIAGNOSIS — N3281 Overactive bladder: Secondary | ICD-10-CM | POA: Diagnosis not present

## 2024-05-18 DIAGNOSIS — R296 Repeated falls: Secondary | ICD-10-CM | POA: Diagnosis not present

## 2024-05-18 DIAGNOSIS — M1990 Unspecified osteoarthritis, unspecified site: Secondary | ICD-10-CM | POA: Diagnosis not present

## 2024-05-18 DIAGNOSIS — F419 Anxiety disorder, unspecified: Secondary | ICD-10-CM | POA: Diagnosis not present

## 2024-05-18 DIAGNOSIS — Z87440 Personal history of urinary (tract) infections: Secondary | ICD-10-CM | POA: Diagnosis not present

## 2024-05-18 DIAGNOSIS — E1140 Type 2 diabetes mellitus with diabetic neuropathy, unspecified: Secondary | ICD-10-CM | POA: Diagnosis not present

## 2024-05-18 DIAGNOSIS — R32 Unspecified urinary incontinence: Secondary | ICD-10-CM | POA: Diagnosis not present

## 2024-05-19 DIAGNOSIS — Z87440 Personal history of urinary (tract) infections: Secondary | ICD-10-CM | POA: Diagnosis not present

## 2024-05-19 DIAGNOSIS — Z791 Long term (current) use of non-steroidal anti-inflammatories (NSAID): Secondary | ICD-10-CM | POA: Diagnosis not present

## 2024-05-19 DIAGNOSIS — Z7984 Long term (current) use of oral hypoglycemic drugs: Secondary | ICD-10-CM | POA: Diagnosis not present

## 2024-05-19 DIAGNOSIS — Z87891 Personal history of nicotine dependence: Secondary | ICD-10-CM | POA: Diagnosis not present

## 2024-05-19 DIAGNOSIS — Z556 Problems related to health literacy: Secondary | ICD-10-CM | POA: Diagnosis not present

## 2024-05-19 DIAGNOSIS — R296 Repeated falls: Secondary | ICD-10-CM | POA: Diagnosis not present

## 2024-05-19 DIAGNOSIS — M79601 Pain in right arm: Secondary | ICD-10-CM | POA: Diagnosis not present

## 2024-05-19 DIAGNOSIS — E039 Hypothyroidism, unspecified: Secondary | ICD-10-CM | POA: Diagnosis not present

## 2024-05-19 DIAGNOSIS — Z6835 Body mass index (BMI) 35.0-35.9, adult: Secondary | ICD-10-CM | POA: Diagnosis not present

## 2024-05-19 DIAGNOSIS — R32 Unspecified urinary incontinence: Secondary | ICD-10-CM | POA: Diagnosis not present

## 2024-05-19 DIAGNOSIS — Z87820 Personal history of traumatic brain injury: Secondary | ICD-10-CM | POA: Diagnosis not present

## 2024-05-19 DIAGNOSIS — N3281 Overactive bladder: Secondary | ICD-10-CM | POA: Diagnosis not present

## 2024-05-19 DIAGNOSIS — Z9181 History of falling: Secondary | ICD-10-CM | POA: Diagnosis not present

## 2024-05-19 DIAGNOSIS — I10 Essential (primary) hypertension: Secondary | ICD-10-CM | POA: Diagnosis not present

## 2024-05-19 DIAGNOSIS — J45909 Unspecified asthma, uncomplicated: Secondary | ICD-10-CM | POA: Diagnosis not present

## 2024-05-19 DIAGNOSIS — E785 Hyperlipidemia, unspecified: Secondary | ICD-10-CM | POA: Diagnosis not present

## 2024-05-19 DIAGNOSIS — E1140 Type 2 diabetes mellitus with diabetic neuropathy, unspecified: Secondary | ICD-10-CM | POA: Diagnosis not present

## 2024-05-19 DIAGNOSIS — F419 Anxiety disorder, unspecified: Secondary | ICD-10-CM | POA: Diagnosis not present

## 2024-05-19 DIAGNOSIS — F32A Depression, unspecified: Secondary | ICD-10-CM | POA: Diagnosis not present

## 2024-05-19 DIAGNOSIS — M1990 Unspecified osteoarthritis, unspecified site: Secondary | ICD-10-CM | POA: Diagnosis not present

## 2024-05-19 DIAGNOSIS — K219 Gastro-esophageal reflux disease without esophagitis: Secondary | ICD-10-CM | POA: Diagnosis not present

## 2024-05-19 DIAGNOSIS — E049 Nontoxic goiter, unspecified: Secondary | ICD-10-CM | POA: Diagnosis not present

## 2024-05-19 DIAGNOSIS — K5900 Constipation, unspecified: Secondary | ICD-10-CM | POA: Diagnosis not present

## 2024-05-19 DIAGNOSIS — Z7982 Long term (current) use of aspirin: Secondary | ICD-10-CM | POA: Diagnosis not present

## 2024-05-20 DIAGNOSIS — Z556 Problems related to health literacy: Secondary | ICD-10-CM | POA: Diagnosis not present

## 2024-05-20 DIAGNOSIS — E785 Hyperlipidemia, unspecified: Secondary | ICD-10-CM | POA: Diagnosis not present

## 2024-05-20 DIAGNOSIS — Z7982 Long term (current) use of aspirin: Secondary | ICD-10-CM | POA: Diagnosis not present

## 2024-05-20 DIAGNOSIS — E049 Nontoxic goiter, unspecified: Secondary | ICD-10-CM | POA: Diagnosis not present

## 2024-05-20 DIAGNOSIS — Z87440 Personal history of urinary (tract) infections: Secondary | ICD-10-CM | POA: Diagnosis not present

## 2024-05-20 DIAGNOSIS — I10 Essential (primary) hypertension: Secondary | ICD-10-CM | POA: Diagnosis not present

## 2024-05-20 DIAGNOSIS — J45909 Unspecified asthma, uncomplicated: Secondary | ICD-10-CM | POA: Diagnosis not present

## 2024-05-20 DIAGNOSIS — R32 Unspecified urinary incontinence: Secondary | ICD-10-CM | POA: Diagnosis not present

## 2024-05-20 DIAGNOSIS — Z87891 Personal history of nicotine dependence: Secondary | ICD-10-CM | POA: Diagnosis not present

## 2024-05-20 DIAGNOSIS — E039 Hypothyroidism, unspecified: Secondary | ICD-10-CM | POA: Diagnosis not present

## 2024-05-20 DIAGNOSIS — E1140 Type 2 diabetes mellitus with diabetic neuropathy, unspecified: Secondary | ICD-10-CM | POA: Diagnosis not present

## 2024-05-20 DIAGNOSIS — F419 Anxiety disorder, unspecified: Secondary | ICD-10-CM | POA: Diagnosis not present

## 2024-05-20 DIAGNOSIS — K219 Gastro-esophageal reflux disease without esophagitis: Secondary | ICD-10-CM | POA: Diagnosis not present

## 2024-05-20 DIAGNOSIS — M79601 Pain in right arm: Secondary | ICD-10-CM | POA: Diagnosis not present

## 2024-05-20 DIAGNOSIS — Z791 Long term (current) use of non-steroidal anti-inflammatories (NSAID): Secondary | ICD-10-CM | POA: Diagnosis not present

## 2024-05-20 DIAGNOSIS — R296 Repeated falls: Secondary | ICD-10-CM | POA: Diagnosis not present

## 2024-05-20 DIAGNOSIS — M1990 Unspecified osteoarthritis, unspecified site: Secondary | ICD-10-CM | POA: Diagnosis not present

## 2024-05-20 DIAGNOSIS — Z6835 Body mass index (BMI) 35.0-35.9, adult: Secondary | ICD-10-CM | POA: Diagnosis not present

## 2024-05-20 DIAGNOSIS — Z87820 Personal history of traumatic brain injury: Secondary | ICD-10-CM | POA: Diagnosis not present

## 2024-05-20 DIAGNOSIS — Z9181 History of falling: Secondary | ICD-10-CM | POA: Diagnosis not present

## 2024-05-20 DIAGNOSIS — N3281 Overactive bladder: Secondary | ICD-10-CM | POA: Diagnosis not present

## 2024-05-20 DIAGNOSIS — Z7984 Long term (current) use of oral hypoglycemic drugs: Secondary | ICD-10-CM | POA: Diagnosis not present

## 2024-05-20 DIAGNOSIS — K5900 Constipation, unspecified: Secondary | ICD-10-CM | POA: Diagnosis not present

## 2024-05-20 DIAGNOSIS — F32A Depression, unspecified: Secondary | ICD-10-CM | POA: Diagnosis not present

## 2024-05-25 DIAGNOSIS — Z556 Problems related to health literacy: Secondary | ICD-10-CM | POA: Diagnosis not present

## 2024-05-25 DIAGNOSIS — R296 Repeated falls: Secondary | ICD-10-CM | POA: Diagnosis not present

## 2024-05-25 DIAGNOSIS — I10 Essential (primary) hypertension: Secondary | ICD-10-CM | POA: Diagnosis not present

## 2024-05-25 DIAGNOSIS — N3281 Overactive bladder: Secondary | ICD-10-CM | POA: Diagnosis not present

## 2024-05-25 DIAGNOSIS — E785 Hyperlipidemia, unspecified: Secondary | ICD-10-CM | POA: Diagnosis not present

## 2024-05-25 DIAGNOSIS — Z6835 Body mass index (BMI) 35.0-35.9, adult: Secondary | ICD-10-CM | POA: Diagnosis not present

## 2024-05-25 DIAGNOSIS — F419 Anxiety disorder, unspecified: Secondary | ICD-10-CM | POA: Diagnosis not present

## 2024-05-25 DIAGNOSIS — M1990 Unspecified osteoarthritis, unspecified site: Secondary | ICD-10-CM | POA: Diagnosis not present

## 2024-05-25 DIAGNOSIS — J45909 Unspecified asthma, uncomplicated: Secondary | ICD-10-CM | POA: Diagnosis not present

## 2024-05-25 DIAGNOSIS — K219 Gastro-esophageal reflux disease without esophagitis: Secondary | ICD-10-CM | POA: Diagnosis not present

## 2024-05-25 DIAGNOSIS — E039 Hypothyroidism, unspecified: Secondary | ICD-10-CM | POA: Diagnosis not present

## 2024-05-25 DIAGNOSIS — Z7982 Long term (current) use of aspirin: Secondary | ICD-10-CM | POA: Diagnosis not present

## 2024-05-25 DIAGNOSIS — R32 Unspecified urinary incontinence: Secondary | ICD-10-CM | POA: Diagnosis not present

## 2024-05-25 DIAGNOSIS — Z7984 Long term (current) use of oral hypoglycemic drugs: Secondary | ICD-10-CM | POA: Diagnosis not present

## 2024-05-25 DIAGNOSIS — Z791 Long term (current) use of non-steroidal anti-inflammatories (NSAID): Secondary | ICD-10-CM | POA: Diagnosis not present

## 2024-05-25 DIAGNOSIS — Z87440 Personal history of urinary (tract) infections: Secondary | ICD-10-CM | POA: Diagnosis not present

## 2024-05-25 DIAGNOSIS — E1140 Type 2 diabetes mellitus with diabetic neuropathy, unspecified: Secondary | ICD-10-CM | POA: Diagnosis not present

## 2024-05-25 DIAGNOSIS — F32A Depression, unspecified: Secondary | ICD-10-CM | POA: Diagnosis not present

## 2024-05-25 DIAGNOSIS — M79601 Pain in right arm: Secondary | ICD-10-CM | POA: Diagnosis not present

## 2024-05-25 DIAGNOSIS — Z87891 Personal history of nicotine dependence: Secondary | ICD-10-CM | POA: Diagnosis not present

## 2024-05-25 DIAGNOSIS — K5900 Constipation, unspecified: Secondary | ICD-10-CM | POA: Diagnosis not present

## 2024-05-25 DIAGNOSIS — Z9181 History of falling: Secondary | ICD-10-CM | POA: Diagnosis not present

## 2024-05-25 DIAGNOSIS — E049 Nontoxic goiter, unspecified: Secondary | ICD-10-CM | POA: Diagnosis not present

## 2024-05-25 DIAGNOSIS — Z87820 Personal history of traumatic brain injury: Secondary | ICD-10-CM | POA: Diagnosis not present

## 2024-05-27 DIAGNOSIS — E039 Hypothyroidism, unspecified: Secondary | ICD-10-CM | POA: Diagnosis not present

## 2024-05-27 DIAGNOSIS — Z791 Long term (current) use of non-steroidal anti-inflammatories (NSAID): Secondary | ICD-10-CM | POA: Diagnosis not present

## 2024-05-27 DIAGNOSIS — Z556 Problems related to health literacy: Secondary | ICD-10-CM | POA: Diagnosis not present

## 2024-05-27 DIAGNOSIS — E785 Hyperlipidemia, unspecified: Secondary | ICD-10-CM | POA: Diagnosis not present

## 2024-05-27 DIAGNOSIS — Z7984 Long term (current) use of oral hypoglycemic drugs: Secondary | ICD-10-CM | POA: Diagnosis not present

## 2024-05-27 DIAGNOSIS — E049 Nontoxic goiter, unspecified: Secondary | ICD-10-CM | POA: Diagnosis not present

## 2024-05-27 DIAGNOSIS — F419 Anxiety disorder, unspecified: Secondary | ICD-10-CM | POA: Diagnosis not present

## 2024-05-27 DIAGNOSIS — K5900 Constipation, unspecified: Secondary | ICD-10-CM | POA: Diagnosis not present

## 2024-05-27 DIAGNOSIS — Z87891 Personal history of nicotine dependence: Secondary | ICD-10-CM | POA: Diagnosis not present

## 2024-05-27 DIAGNOSIS — J45909 Unspecified asthma, uncomplicated: Secondary | ICD-10-CM | POA: Diagnosis not present

## 2024-05-27 DIAGNOSIS — M79601 Pain in right arm: Secondary | ICD-10-CM | POA: Diagnosis not present

## 2024-05-27 DIAGNOSIS — Z7982 Long term (current) use of aspirin: Secondary | ICD-10-CM | POA: Diagnosis not present

## 2024-05-27 DIAGNOSIS — I10 Essential (primary) hypertension: Secondary | ICD-10-CM | POA: Diagnosis not present

## 2024-05-27 DIAGNOSIS — Z9181 History of falling: Secondary | ICD-10-CM | POA: Diagnosis not present

## 2024-05-27 DIAGNOSIS — E1140 Type 2 diabetes mellitus with diabetic neuropathy, unspecified: Secondary | ICD-10-CM | POA: Diagnosis not present

## 2024-05-27 DIAGNOSIS — Z87440 Personal history of urinary (tract) infections: Secondary | ICD-10-CM | POA: Diagnosis not present

## 2024-05-27 DIAGNOSIS — M1990 Unspecified osteoarthritis, unspecified site: Secondary | ICD-10-CM | POA: Diagnosis not present

## 2024-05-27 DIAGNOSIS — Z6835 Body mass index (BMI) 35.0-35.9, adult: Secondary | ICD-10-CM | POA: Diagnosis not present

## 2024-05-27 DIAGNOSIS — N3281 Overactive bladder: Secondary | ICD-10-CM | POA: Diagnosis not present

## 2024-05-27 DIAGNOSIS — F32A Depression, unspecified: Secondary | ICD-10-CM | POA: Diagnosis not present

## 2024-05-27 DIAGNOSIS — R32 Unspecified urinary incontinence: Secondary | ICD-10-CM | POA: Diagnosis not present

## 2024-05-27 DIAGNOSIS — K219 Gastro-esophageal reflux disease without esophagitis: Secondary | ICD-10-CM | POA: Diagnosis not present

## 2024-05-27 DIAGNOSIS — Z87820 Personal history of traumatic brain injury: Secondary | ICD-10-CM | POA: Diagnosis not present

## 2024-05-27 DIAGNOSIS — R296 Repeated falls: Secondary | ICD-10-CM | POA: Diagnosis not present

## 2024-06-03 DIAGNOSIS — E785 Hyperlipidemia, unspecified: Secondary | ICD-10-CM | POA: Diagnosis not present

## 2024-06-03 DIAGNOSIS — Z7982 Long term (current) use of aspirin: Secondary | ICD-10-CM | POA: Diagnosis not present

## 2024-06-03 DIAGNOSIS — Z87440 Personal history of urinary (tract) infections: Secondary | ICD-10-CM | POA: Diagnosis not present

## 2024-06-03 DIAGNOSIS — J45909 Unspecified asthma, uncomplicated: Secondary | ICD-10-CM | POA: Diagnosis not present

## 2024-06-03 DIAGNOSIS — Z87820 Personal history of traumatic brain injury: Secondary | ICD-10-CM | POA: Diagnosis not present

## 2024-06-03 DIAGNOSIS — Z556 Problems related to health literacy: Secondary | ICD-10-CM | POA: Diagnosis not present

## 2024-06-03 DIAGNOSIS — E1140 Type 2 diabetes mellitus with diabetic neuropathy, unspecified: Secondary | ICD-10-CM | POA: Diagnosis not present

## 2024-06-03 DIAGNOSIS — M1990 Unspecified osteoarthritis, unspecified site: Secondary | ICD-10-CM | POA: Diagnosis not present

## 2024-06-03 DIAGNOSIS — M79601 Pain in right arm: Secondary | ICD-10-CM | POA: Diagnosis not present

## 2024-06-03 DIAGNOSIS — Z87891 Personal history of nicotine dependence: Secondary | ICD-10-CM | POA: Diagnosis not present

## 2024-06-03 DIAGNOSIS — K219 Gastro-esophageal reflux disease without esophagitis: Secondary | ICD-10-CM | POA: Diagnosis not present

## 2024-06-03 DIAGNOSIS — Z791 Long term (current) use of non-steroidal anti-inflammatories (NSAID): Secondary | ICD-10-CM | POA: Diagnosis not present

## 2024-06-03 DIAGNOSIS — R296 Repeated falls: Secondary | ICD-10-CM | POA: Diagnosis not present

## 2024-06-03 DIAGNOSIS — R32 Unspecified urinary incontinence: Secondary | ICD-10-CM | POA: Diagnosis not present

## 2024-06-03 DIAGNOSIS — Z6835 Body mass index (BMI) 35.0-35.9, adult: Secondary | ICD-10-CM | POA: Diagnosis not present

## 2024-06-03 DIAGNOSIS — K5900 Constipation, unspecified: Secondary | ICD-10-CM | POA: Diagnosis not present

## 2024-06-03 DIAGNOSIS — F32A Depression, unspecified: Secondary | ICD-10-CM | POA: Diagnosis not present

## 2024-06-03 DIAGNOSIS — Z7984 Long term (current) use of oral hypoglycemic drugs: Secondary | ICD-10-CM | POA: Diagnosis not present

## 2024-06-03 DIAGNOSIS — E049 Nontoxic goiter, unspecified: Secondary | ICD-10-CM | POA: Diagnosis not present

## 2024-06-03 DIAGNOSIS — I10 Essential (primary) hypertension: Secondary | ICD-10-CM | POA: Diagnosis not present

## 2024-06-03 DIAGNOSIS — Z9181 History of falling: Secondary | ICD-10-CM | POA: Diagnosis not present

## 2024-06-03 DIAGNOSIS — E039 Hypothyroidism, unspecified: Secondary | ICD-10-CM | POA: Diagnosis not present

## 2024-06-03 DIAGNOSIS — F419 Anxiety disorder, unspecified: Secondary | ICD-10-CM | POA: Diagnosis not present

## 2024-06-03 DIAGNOSIS — N3281 Overactive bladder: Secondary | ICD-10-CM | POA: Diagnosis not present

## 2024-06-06 DIAGNOSIS — N3281 Overactive bladder: Secondary | ICD-10-CM | POA: Diagnosis not present

## 2024-06-06 DIAGNOSIS — R296 Repeated falls: Secondary | ICD-10-CM | POA: Diagnosis not present

## 2024-06-06 DIAGNOSIS — I10 Essential (primary) hypertension: Secondary | ICD-10-CM | POA: Diagnosis not present

## 2024-06-06 DIAGNOSIS — K219 Gastro-esophageal reflux disease without esophagitis: Secondary | ICD-10-CM | POA: Diagnosis not present

## 2024-06-06 DIAGNOSIS — Z9181 History of falling: Secondary | ICD-10-CM | POA: Diagnosis not present

## 2024-06-06 DIAGNOSIS — M79601 Pain in right arm: Secondary | ICD-10-CM | POA: Diagnosis not present

## 2024-06-06 DIAGNOSIS — E785 Hyperlipidemia, unspecified: Secondary | ICD-10-CM | POA: Diagnosis not present

## 2024-06-06 DIAGNOSIS — Z87820 Personal history of traumatic brain injury: Secondary | ICD-10-CM | POA: Diagnosis not present

## 2024-06-06 DIAGNOSIS — E049 Nontoxic goiter, unspecified: Secondary | ICD-10-CM | POA: Diagnosis not present

## 2024-06-06 DIAGNOSIS — E1140 Type 2 diabetes mellitus with diabetic neuropathy, unspecified: Secondary | ICD-10-CM | POA: Diagnosis not present

## 2024-06-06 DIAGNOSIS — Z87891 Personal history of nicotine dependence: Secondary | ICD-10-CM | POA: Diagnosis not present

## 2024-06-06 DIAGNOSIS — Z7984 Long term (current) use of oral hypoglycemic drugs: Secondary | ICD-10-CM | POA: Diagnosis not present

## 2024-06-06 DIAGNOSIS — F419 Anxiety disorder, unspecified: Secondary | ICD-10-CM | POA: Diagnosis not present

## 2024-06-06 DIAGNOSIS — Z556 Problems related to health literacy: Secondary | ICD-10-CM | POA: Diagnosis not present

## 2024-06-06 DIAGNOSIS — J45909 Unspecified asthma, uncomplicated: Secondary | ICD-10-CM | POA: Diagnosis not present

## 2024-06-06 DIAGNOSIS — K5900 Constipation, unspecified: Secondary | ICD-10-CM | POA: Diagnosis not present

## 2024-06-06 DIAGNOSIS — F32A Depression, unspecified: Secondary | ICD-10-CM | POA: Diagnosis not present

## 2024-06-06 DIAGNOSIS — Z791 Long term (current) use of non-steroidal anti-inflammatories (NSAID): Secondary | ICD-10-CM | POA: Diagnosis not present

## 2024-06-06 DIAGNOSIS — Z87440 Personal history of urinary (tract) infections: Secondary | ICD-10-CM | POA: Diagnosis not present

## 2024-06-06 DIAGNOSIS — E039 Hypothyroidism, unspecified: Secondary | ICD-10-CM | POA: Diagnosis not present

## 2024-06-06 DIAGNOSIS — Z6835 Body mass index (BMI) 35.0-35.9, adult: Secondary | ICD-10-CM | POA: Diagnosis not present

## 2024-06-06 DIAGNOSIS — Z7982 Long term (current) use of aspirin: Secondary | ICD-10-CM | POA: Diagnosis not present

## 2024-06-06 DIAGNOSIS — R32 Unspecified urinary incontinence: Secondary | ICD-10-CM | POA: Diagnosis not present

## 2024-06-06 DIAGNOSIS — M1990 Unspecified osteoarthritis, unspecified site: Secondary | ICD-10-CM | POA: Diagnosis not present

## 2024-06-07 DIAGNOSIS — Z9181 History of falling: Secondary | ICD-10-CM | POA: Diagnosis not present

## 2024-06-07 DIAGNOSIS — K219 Gastro-esophageal reflux disease without esophagitis: Secondary | ICD-10-CM | POA: Diagnosis not present

## 2024-06-07 DIAGNOSIS — Z556 Problems related to health literacy: Secondary | ICD-10-CM | POA: Diagnosis not present

## 2024-06-07 DIAGNOSIS — R296 Repeated falls: Secondary | ICD-10-CM | POA: Diagnosis not present

## 2024-06-07 DIAGNOSIS — R32 Unspecified urinary incontinence: Secondary | ICD-10-CM | POA: Diagnosis not present

## 2024-06-07 DIAGNOSIS — M1990 Unspecified osteoarthritis, unspecified site: Secondary | ICD-10-CM | POA: Diagnosis not present

## 2024-06-07 DIAGNOSIS — Z7982 Long term (current) use of aspirin: Secondary | ICD-10-CM | POA: Diagnosis not present

## 2024-06-07 DIAGNOSIS — Z87891 Personal history of nicotine dependence: Secondary | ICD-10-CM | POA: Diagnosis not present

## 2024-06-07 DIAGNOSIS — Z791 Long term (current) use of non-steroidal anti-inflammatories (NSAID): Secondary | ICD-10-CM | POA: Diagnosis not present

## 2024-06-07 DIAGNOSIS — E049 Nontoxic goiter, unspecified: Secondary | ICD-10-CM | POA: Diagnosis not present

## 2024-06-07 DIAGNOSIS — N3281 Overactive bladder: Secondary | ICD-10-CM | POA: Diagnosis not present

## 2024-06-07 DIAGNOSIS — E1140 Type 2 diabetes mellitus with diabetic neuropathy, unspecified: Secondary | ICD-10-CM | POA: Diagnosis not present

## 2024-06-07 DIAGNOSIS — Z87820 Personal history of traumatic brain injury: Secondary | ICD-10-CM | POA: Diagnosis not present

## 2024-06-07 DIAGNOSIS — Z7984 Long term (current) use of oral hypoglycemic drugs: Secondary | ICD-10-CM | POA: Diagnosis not present

## 2024-06-07 DIAGNOSIS — I10 Essential (primary) hypertension: Secondary | ICD-10-CM | POA: Diagnosis not present

## 2024-06-07 DIAGNOSIS — E039 Hypothyroidism, unspecified: Secondary | ICD-10-CM | POA: Diagnosis not present

## 2024-06-07 DIAGNOSIS — Z87440 Personal history of urinary (tract) infections: Secondary | ICD-10-CM | POA: Diagnosis not present

## 2024-06-07 DIAGNOSIS — J45909 Unspecified asthma, uncomplicated: Secondary | ICD-10-CM | POA: Diagnosis not present

## 2024-06-07 DIAGNOSIS — F32A Depression, unspecified: Secondary | ICD-10-CM | POA: Diagnosis not present

## 2024-06-07 DIAGNOSIS — M79601 Pain in right arm: Secondary | ICD-10-CM | POA: Diagnosis not present

## 2024-06-07 DIAGNOSIS — K5900 Constipation, unspecified: Secondary | ICD-10-CM | POA: Diagnosis not present

## 2024-06-07 DIAGNOSIS — Z6835 Body mass index (BMI) 35.0-35.9, adult: Secondary | ICD-10-CM | POA: Diagnosis not present

## 2024-06-07 DIAGNOSIS — F419 Anxiety disorder, unspecified: Secondary | ICD-10-CM | POA: Diagnosis not present

## 2024-06-07 DIAGNOSIS — E785 Hyperlipidemia, unspecified: Secondary | ICD-10-CM | POA: Diagnosis not present

## 2024-06-08 DIAGNOSIS — N3281 Overactive bladder: Secondary | ICD-10-CM | POA: Diagnosis not present

## 2024-06-08 DIAGNOSIS — E1140 Type 2 diabetes mellitus with diabetic neuropathy, unspecified: Secondary | ICD-10-CM | POA: Diagnosis not present

## 2024-06-08 DIAGNOSIS — R296 Repeated falls: Secondary | ICD-10-CM | POA: Diagnosis not present

## 2024-06-08 DIAGNOSIS — M1990 Unspecified osteoarthritis, unspecified site: Secondary | ICD-10-CM | POA: Diagnosis not present

## 2024-06-08 DIAGNOSIS — Z7984 Long term (current) use of oral hypoglycemic drugs: Secondary | ICD-10-CM | POA: Diagnosis not present

## 2024-06-08 DIAGNOSIS — J45909 Unspecified asthma, uncomplicated: Secondary | ICD-10-CM | POA: Diagnosis not present

## 2024-06-08 DIAGNOSIS — Z7982 Long term (current) use of aspirin: Secondary | ICD-10-CM | POA: Diagnosis not present

## 2024-06-08 DIAGNOSIS — E785 Hyperlipidemia, unspecified: Secondary | ICD-10-CM | POA: Diagnosis not present

## 2024-06-08 DIAGNOSIS — F32A Depression, unspecified: Secondary | ICD-10-CM | POA: Diagnosis not present

## 2024-06-08 DIAGNOSIS — I10 Essential (primary) hypertension: Secondary | ICD-10-CM | POA: Diagnosis not present

## 2024-06-08 DIAGNOSIS — Z87820 Personal history of traumatic brain injury: Secondary | ICD-10-CM | POA: Diagnosis not present

## 2024-06-08 DIAGNOSIS — F419 Anxiety disorder, unspecified: Secondary | ICD-10-CM | POA: Diagnosis not present

## 2024-06-08 DIAGNOSIS — E039 Hypothyroidism, unspecified: Secondary | ICD-10-CM | POA: Diagnosis not present

## 2024-06-08 DIAGNOSIS — Z791 Long term (current) use of non-steroidal anti-inflammatories (NSAID): Secondary | ICD-10-CM | POA: Diagnosis not present

## 2024-06-08 DIAGNOSIS — R32 Unspecified urinary incontinence: Secondary | ICD-10-CM | POA: Diagnosis not present

## 2024-06-08 DIAGNOSIS — K5900 Constipation, unspecified: Secondary | ICD-10-CM | POA: Diagnosis not present

## 2024-06-08 DIAGNOSIS — E049 Nontoxic goiter, unspecified: Secondary | ICD-10-CM | POA: Diagnosis not present

## 2024-06-08 DIAGNOSIS — Z6835 Body mass index (BMI) 35.0-35.9, adult: Secondary | ICD-10-CM | POA: Diagnosis not present

## 2024-06-08 DIAGNOSIS — M79601 Pain in right arm: Secondary | ICD-10-CM | POA: Diagnosis not present

## 2024-06-08 DIAGNOSIS — K219 Gastro-esophageal reflux disease without esophagitis: Secondary | ICD-10-CM | POA: Diagnosis not present

## 2024-06-08 DIAGNOSIS — Z87891 Personal history of nicotine dependence: Secondary | ICD-10-CM | POA: Diagnosis not present

## 2024-06-08 DIAGNOSIS — Z87440 Personal history of urinary (tract) infections: Secondary | ICD-10-CM | POA: Diagnosis not present

## 2024-06-08 DIAGNOSIS — Z9181 History of falling: Secondary | ICD-10-CM | POA: Diagnosis not present

## 2024-06-08 DIAGNOSIS — Z556 Problems related to health literacy: Secondary | ICD-10-CM | POA: Diagnosis not present

## 2024-06-16 DIAGNOSIS — Z7984 Long term (current) use of oral hypoglycemic drugs: Secondary | ICD-10-CM | POA: Diagnosis not present

## 2024-06-16 DIAGNOSIS — R32 Unspecified urinary incontinence: Secondary | ICD-10-CM | POA: Diagnosis not present

## 2024-06-16 DIAGNOSIS — Z556 Problems related to health literacy: Secondary | ICD-10-CM | POA: Diagnosis not present

## 2024-06-16 DIAGNOSIS — M1990 Unspecified osteoarthritis, unspecified site: Secondary | ICD-10-CM | POA: Diagnosis not present

## 2024-06-16 DIAGNOSIS — K5900 Constipation, unspecified: Secondary | ICD-10-CM | POA: Diagnosis not present

## 2024-06-16 DIAGNOSIS — M79601 Pain in right arm: Secondary | ICD-10-CM | POA: Diagnosis not present

## 2024-06-16 DIAGNOSIS — Z87440 Personal history of urinary (tract) infections: Secondary | ICD-10-CM | POA: Diagnosis not present

## 2024-06-16 DIAGNOSIS — I10 Essential (primary) hypertension: Secondary | ICD-10-CM | POA: Diagnosis not present

## 2024-06-16 DIAGNOSIS — E785 Hyperlipidemia, unspecified: Secondary | ICD-10-CM | POA: Diagnosis not present

## 2024-06-16 DIAGNOSIS — E039 Hypothyroidism, unspecified: Secondary | ICD-10-CM | POA: Diagnosis not present

## 2024-06-16 DIAGNOSIS — E1140 Type 2 diabetes mellitus with diabetic neuropathy, unspecified: Secondary | ICD-10-CM | POA: Diagnosis not present

## 2024-06-16 DIAGNOSIS — N3281 Overactive bladder: Secondary | ICD-10-CM | POA: Diagnosis not present

## 2024-06-16 DIAGNOSIS — E049 Nontoxic goiter, unspecified: Secondary | ICD-10-CM | POA: Diagnosis not present

## 2024-06-16 DIAGNOSIS — Z6835 Body mass index (BMI) 35.0-35.9, adult: Secondary | ICD-10-CM | POA: Diagnosis not present

## 2024-06-16 DIAGNOSIS — R296 Repeated falls: Secondary | ICD-10-CM | POA: Diagnosis not present

## 2024-06-16 DIAGNOSIS — F32A Depression, unspecified: Secondary | ICD-10-CM | POA: Diagnosis not present

## 2024-06-16 DIAGNOSIS — Z7982 Long term (current) use of aspirin: Secondary | ICD-10-CM | POA: Diagnosis not present

## 2024-06-16 DIAGNOSIS — F419 Anxiety disorder, unspecified: Secondary | ICD-10-CM | POA: Diagnosis not present

## 2024-06-16 DIAGNOSIS — J45909 Unspecified asthma, uncomplicated: Secondary | ICD-10-CM | POA: Diagnosis not present

## 2024-06-16 DIAGNOSIS — K219 Gastro-esophageal reflux disease without esophagitis: Secondary | ICD-10-CM | POA: Diagnosis not present

## 2024-06-16 DIAGNOSIS — Z791 Long term (current) use of non-steroidal anti-inflammatories (NSAID): Secondary | ICD-10-CM | POA: Diagnosis not present

## 2024-06-16 DIAGNOSIS — Z9181 History of falling: Secondary | ICD-10-CM | POA: Diagnosis not present

## 2024-06-16 DIAGNOSIS — Z87820 Personal history of traumatic brain injury: Secondary | ICD-10-CM | POA: Diagnosis not present

## 2024-06-16 DIAGNOSIS — Z87891 Personal history of nicotine dependence: Secondary | ICD-10-CM | POA: Diagnosis not present

## 2024-06-17 ENCOUNTER — Telehealth: Payer: Self-pay | Admitting: Internal Medicine

## 2024-06-17 NOTE — Telephone Encounter (Signed)
 Copied from CRM [id number]. Topic: Clinical - Prescription Issue >> Jun 17, 2024  9:54 AM [place] E wrote: Reason for CRM: Patient stated she lost one of her medications, went through med list and patient did not know the exact name of medication, tried to locate which med this was and neither of them were sounding familiar to her. Callback number [phone]. >> Jun 17, 2024 10:02 AM Viola F wrote: Patient called back to let office know the name of the medication is citalopram  (CELEXA ) 40 MG tablet [[id number]]  She cannot find it and wants to know if another script can be sent to her pharmacy on file. Please call her with an update.

## 2024-06-18 MED ORDER — CITALOPRAM HYDROBROMIDE 40 MG PO TABS: 40.0000 mg | ORAL_TABLET | Freq: Every day | ORAL | 3 refills | Status: DC

## 2024-06-18 NOTE — Telephone Encounter (Signed)
 Ok this refill is done erx

## 2024-06-22 DIAGNOSIS — F32A Depression, unspecified: Secondary | ICD-10-CM | POA: Diagnosis not present

## 2024-06-22 DIAGNOSIS — E049 Nontoxic goiter, unspecified: Secondary | ICD-10-CM | POA: Diagnosis not present

## 2024-06-22 DIAGNOSIS — K219 Gastro-esophageal reflux disease without esophagitis: Secondary | ICD-10-CM | POA: Diagnosis not present

## 2024-06-22 DIAGNOSIS — Z87440 Personal history of urinary (tract) infections: Secondary | ICD-10-CM | POA: Diagnosis not present

## 2024-06-22 DIAGNOSIS — F419 Anxiety disorder, unspecified: Secondary | ICD-10-CM | POA: Diagnosis not present

## 2024-06-22 DIAGNOSIS — K5900 Constipation, unspecified: Secondary | ICD-10-CM | POA: Diagnosis not present

## 2024-06-22 DIAGNOSIS — R32 Unspecified urinary incontinence: Secondary | ICD-10-CM | POA: Diagnosis not present

## 2024-06-22 DIAGNOSIS — Z791 Long term (current) use of non-steroidal anti-inflammatories (NSAID): Secondary | ICD-10-CM | POA: Diagnosis not present

## 2024-06-22 DIAGNOSIS — M79601 Pain in right arm: Secondary | ICD-10-CM | POA: Diagnosis not present

## 2024-06-22 DIAGNOSIS — E785 Hyperlipidemia, unspecified: Secondary | ICD-10-CM | POA: Diagnosis not present

## 2024-06-22 DIAGNOSIS — R296 Repeated falls: Secondary | ICD-10-CM | POA: Diagnosis not present

## 2024-06-22 DIAGNOSIS — E1140 Type 2 diabetes mellitus with diabetic neuropathy, unspecified: Secondary | ICD-10-CM | POA: Diagnosis not present

## 2024-06-22 DIAGNOSIS — Z87891 Personal history of nicotine dependence: Secondary | ICD-10-CM | POA: Diagnosis not present

## 2024-06-22 DIAGNOSIS — M1990 Unspecified osteoarthritis, unspecified site: Secondary | ICD-10-CM | POA: Diagnosis not present

## 2024-06-22 DIAGNOSIS — E039 Hypothyroidism, unspecified: Secondary | ICD-10-CM | POA: Diagnosis not present

## 2024-06-22 DIAGNOSIS — Z9181 History of falling: Secondary | ICD-10-CM | POA: Diagnosis not present

## 2024-06-22 DIAGNOSIS — Z6835 Body mass index (BMI) 35.0-35.9, adult: Secondary | ICD-10-CM | POA: Diagnosis not present

## 2024-06-22 DIAGNOSIS — Z87820 Personal history of traumatic brain injury: Secondary | ICD-10-CM | POA: Diagnosis not present

## 2024-06-22 DIAGNOSIS — Z7984 Long term (current) use of oral hypoglycemic drugs: Secondary | ICD-10-CM | POA: Diagnosis not present

## 2024-06-22 DIAGNOSIS — I10 Essential (primary) hypertension: Secondary | ICD-10-CM | POA: Diagnosis not present

## 2024-06-22 DIAGNOSIS — N3281 Overactive bladder: Secondary | ICD-10-CM | POA: Diagnosis not present

## 2024-06-22 DIAGNOSIS — J45909 Unspecified asthma, uncomplicated: Secondary | ICD-10-CM | POA: Diagnosis not present

## 2024-06-22 DIAGNOSIS — Z7982 Long term (current) use of aspirin: Secondary | ICD-10-CM | POA: Diagnosis not present

## 2024-06-22 DIAGNOSIS — Z556 Problems related to health literacy: Secondary | ICD-10-CM | POA: Diagnosis not present

## 2024-06-28 DIAGNOSIS — Z556 Problems related to health literacy: Secondary | ICD-10-CM | POA: Diagnosis not present

## 2024-06-28 DIAGNOSIS — N3281 Overactive bladder: Secondary | ICD-10-CM | POA: Diagnosis not present

## 2024-06-28 DIAGNOSIS — I10 Essential (primary) hypertension: Secondary | ICD-10-CM | POA: Diagnosis not present

## 2024-06-28 DIAGNOSIS — K5900 Constipation, unspecified: Secondary | ICD-10-CM | POA: Diagnosis not present

## 2024-06-28 DIAGNOSIS — F419 Anxiety disorder, unspecified: Secondary | ICD-10-CM | POA: Diagnosis not present

## 2024-06-28 DIAGNOSIS — R296 Repeated falls: Secondary | ICD-10-CM | POA: Diagnosis not present

## 2024-06-28 DIAGNOSIS — E1140 Type 2 diabetes mellitus with diabetic neuropathy, unspecified: Secondary | ICD-10-CM | POA: Diagnosis not present

## 2024-06-28 DIAGNOSIS — M1990 Unspecified osteoarthritis, unspecified site: Secondary | ICD-10-CM | POA: Diagnosis not present

## 2024-06-28 DIAGNOSIS — Z87820 Personal history of traumatic brain injury: Secondary | ICD-10-CM | POA: Diagnosis not present

## 2024-06-28 DIAGNOSIS — E049 Nontoxic goiter, unspecified: Secondary | ICD-10-CM | POA: Diagnosis not present

## 2024-06-28 DIAGNOSIS — E039 Hypothyroidism, unspecified: Secondary | ICD-10-CM | POA: Diagnosis not present

## 2024-06-28 DIAGNOSIS — F32A Depression, unspecified: Secondary | ICD-10-CM | POA: Diagnosis not present

## 2024-06-28 DIAGNOSIS — Z7984 Long term (current) use of oral hypoglycemic drugs: Secondary | ICD-10-CM | POA: Diagnosis not present

## 2024-06-28 DIAGNOSIS — Z9181 History of falling: Secondary | ICD-10-CM | POA: Diagnosis not present

## 2024-06-28 DIAGNOSIS — R32 Unspecified urinary incontinence: Secondary | ICD-10-CM | POA: Diagnosis not present

## 2024-06-28 DIAGNOSIS — K219 Gastro-esophageal reflux disease without esophagitis: Secondary | ICD-10-CM | POA: Diagnosis not present

## 2024-06-28 DIAGNOSIS — M79601 Pain in right arm: Secondary | ICD-10-CM | POA: Diagnosis not present

## 2024-06-28 DIAGNOSIS — Z7982 Long term (current) use of aspirin: Secondary | ICD-10-CM | POA: Diagnosis not present

## 2024-06-28 DIAGNOSIS — Z87440 Personal history of urinary (tract) infections: Secondary | ICD-10-CM | POA: Diagnosis not present

## 2024-06-28 DIAGNOSIS — E785 Hyperlipidemia, unspecified: Secondary | ICD-10-CM | POA: Diagnosis not present

## 2024-06-28 DIAGNOSIS — Z791 Long term (current) use of non-steroidal anti-inflammatories (NSAID): Secondary | ICD-10-CM | POA: Diagnosis not present

## 2024-06-28 DIAGNOSIS — J45909 Unspecified asthma, uncomplicated: Secondary | ICD-10-CM | POA: Diagnosis not present

## 2024-06-28 DIAGNOSIS — Z6835 Body mass index (BMI) 35.0-35.9, adult: Secondary | ICD-10-CM | POA: Diagnosis not present

## 2024-06-28 DIAGNOSIS — Z87891 Personal history of nicotine dependence: Secondary | ICD-10-CM | POA: Diagnosis not present

## 2024-06-29 DIAGNOSIS — N3281 Overactive bladder: Secondary | ICD-10-CM | POA: Diagnosis not present

## 2024-06-29 DIAGNOSIS — Z556 Problems related to health literacy: Secondary | ICD-10-CM | POA: Diagnosis not present

## 2024-06-29 DIAGNOSIS — Z6835 Body mass index (BMI) 35.0-35.9, adult: Secondary | ICD-10-CM | POA: Diagnosis not present

## 2024-06-29 DIAGNOSIS — R32 Unspecified urinary incontinence: Secondary | ICD-10-CM | POA: Diagnosis not present

## 2024-06-29 DIAGNOSIS — Z7984 Long term (current) use of oral hypoglycemic drugs: Secondary | ICD-10-CM | POA: Diagnosis not present

## 2024-06-29 DIAGNOSIS — I10 Essential (primary) hypertension: Secondary | ICD-10-CM | POA: Diagnosis not present

## 2024-06-29 DIAGNOSIS — E049 Nontoxic goiter, unspecified: Secondary | ICD-10-CM | POA: Diagnosis not present

## 2024-06-29 DIAGNOSIS — Z87891 Personal history of nicotine dependence: Secondary | ICD-10-CM | POA: Diagnosis not present

## 2024-06-29 DIAGNOSIS — Z87820 Personal history of traumatic brain injury: Secondary | ICD-10-CM | POA: Diagnosis not present

## 2024-06-29 DIAGNOSIS — E785 Hyperlipidemia, unspecified: Secondary | ICD-10-CM | POA: Diagnosis not present

## 2024-06-29 DIAGNOSIS — E039 Hypothyroidism, unspecified: Secondary | ICD-10-CM | POA: Diagnosis not present

## 2024-06-29 DIAGNOSIS — F419 Anxiety disorder, unspecified: Secondary | ICD-10-CM | POA: Diagnosis not present

## 2024-06-29 DIAGNOSIS — Z9181 History of falling: Secondary | ICD-10-CM | POA: Diagnosis not present

## 2024-06-29 DIAGNOSIS — R296 Repeated falls: Secondary | ICD-10-CM | POA: Diagnosis not present

## 2024-06-29 DIAGNOSIS — J45909 Unspecified asthma, uncomplicated: Secondary | ICD-10-CM | POA: Diagnosis not present

## 2024-06-29 DIAGNOSIS — Z791 Long term (current) use of non-steroidal anti-inflammatories (NSAID): Secondary | ICD-10-CM | POA: Diagnosis not present

## 2024-06-29 DIAGNOSIS — E1140 Type 2 diabetes mellitus with diabetic neuropathy, unspecified: Secondary | ICD-10-CM | POA: Diagnosis not present

## 2024-06-29 DIAGNOSIS — Z7982 Long term (current) use of aspirin: Secondary | ICD-10-CM | POA: Diagnosis not present

## 2024-06-29 DIAGNOSIS — M1990 Unspecified osteoarthritis, unspecified site: Secondary | ICD-10-CM | POA: Diagnosis not present

## 2024-06-29 DIAGNOSIS — K5900 Constipation, unspecified: Secondary | ICD-10-CM | POA: Diagnosis not present

## 2024-06-29 DIAGNOSIS — F32A Depression, unspecified: Secondary | ICD-10-CM | POA: Diagnosis not present

## 2024-06-29 DIAGNOSIS — M79601 Pain in right arm: Secondary | ICD-10-CM | POA: Diagnosis not present

## 2024-06-29 DIAGNOSIS — K219 Gastro-esophageal reflux disease without esophagitis: Secondary | ICD-10-CM | POA: Diagnosis not present

## 2024-06-29 DIAGNOSIS — Z87440 Personal history of urinary (tract) infections: Secondary | ICD-10-CM | POA: Diagnosis not present

## 2024-07-05 DIAGNOSIS — E785 Hyperlipidemia, unspecified: Secondary | ICD-10-CM | POA: Diagnosis not present

## 2024-07-05 DIAGNOSIS — E1140 Type 2 diabetes mellitus with diabetic neuropathy, unspecified: Secondary | ICD-10-CM | POA: Diagnosis not present

## 2024-07-05 DIAGNOSIS — Z7982 Long term (current) use of aspirin: Secondary | ICD-10-CM | POA: Diagnosis not present

## 2024-07-05 DIAGNOSIS — Z6835 Body mass index (BMI) 35.0-35.9, adult: Secondary | ICD-10-CM | POA: Diagnosis not present

## 2024-07-05 DIAGNOSIS — R32 Unspecified urinary incontinence: Secondary | ICD-10-CM | POA: Diagnosis not present

## 2024-07-05 DIAGNOSIS — Z7984 Long term (current) use of oral hypoglycemic drugs: Secondary | ICD-10-CM | POA: Diagnosis not present

## 2024-07-05 DIAGNOSIS — Z556 Problems related to health literacy: Secondary | ICD-10-CM | POA: Diagnosis not present

## 2024-07-05 DIAGNOSIS — K5900 Constipation, unspecified: Secondary | ICD-10-CM | POA: Diagnosis not present

## 2024-07-05 DIAGNOSIS — Z87891 Personal history of nicotine dependence: Secondary | ICD-10-CM | POA: Diagnosis not present

## 2024-07-05 DIAGNOSIS — Z87440 Personal history of urinary (tract) infections: Secondary | ICD-10-CM | POA: Diagnosis not present

## 2024-07-05 DIAGNOSIS — Z791 Long term (current) use of non-steroidal anti-inflammatories (NSAID): Secondary | ICD-10-CM | POA: Diagnosis not present

## 2024-07-05 DIAGNOSIS — E039 Hypothyroidism, unspecified: Secondary | ICD-10-CM | POA: Diagnosis not present

## 2024-07-05 DIAGNOSIS — N3281 Overactive bladder: Secondary | ICD-10-CM | POA: Diagnosis not present

## 2024-07-05 DIAGNOSIS — R296 Repeated falls: Secondary | ICD-10-CM | POA: Diagnosis not present

## 2024-07-05 DIAGNOSIS — M79601 Pain in right arm: Secondary | ICD-10-CM | POA: Diagnosis not present

## 2024-07-05 DIAGNOSIS — F419 Anxiety disorder, unspecified: Secondary | ICD-10-CM | POA: Diagnosis not present

## 2024-07-05 DIAGNOSIS — M1990 Unspecified osteoarthritis, unspecified site: Secondary | ICD-10-CM | POA: Diagnosis not present

## 2024-07-05 DIAGNOSIS — F32A Depression, unspecified: Secondary | ICD-10-CM | POA: Diagnosis not present

## 2024-07-05 DIAGNOSIS — E049 Nontoxic goiter, unspecified: Secondary | ICD-10-CM | POA: Diagnosis not present

## 2024-07-05 DIAGNOSIS — Z87820 Personal history of traumatic brain injury: Secondary | ICD-10-CM | POA: Diagnosis not present

## 2024-07-05 DIAGNOSIS — I10 Essential (primary) hypertension: Secondary | ICD-10-CM | POA: Diagnosis not present

## 2024-07-05 DIAGNOSIS — K219 Gastro-esophageal reflux disease without esophagitis: Secondary | ICD-10-CM | POA: Diagnosis not present

## 2024-07-05 DIAGNOSIS — J45909 Unspecified asthma, uncomplicated: Secondary | ICD-10-CM | POA: Diagnosis not present

## 2024-07-05 DIAGNOSIS — Z9181 History of falling: Secondary | ICD-10-CM | POA: Diagnosis not present

## 2024-07-09 DIAGNOSIS — E119 Type 2 diabetes mellitus without complications: Secondary | ICD-10-CM | POA: Diagnosis not present

## 2024-07-22 NOTE — Progress Notes (Deleted)
 Ben Jackson D.[facility] [address] [place] [address] Phone: [phone]   Assessment and Plan:     There are no diagnoses linked to this encounter.  ***   Pertinent previous records reviewed include ***    Follow Up: ***     Subjective:   I, Sira Adsit, am serving as a Neurosurgeon for Doctor Morene Mace  Chief Complaint: bilat knee pain   HPI:   04/23/2024 Alexandra Wolfe is a 78 y.o. female who presents to [facility] at [facility] today for cont'd bilat knee pain. Pt was last seen by Dr. Joane on 01/09/24 and was given repeat bilat Zilretta  injections.   Today, pt reports knees hurt really bad today and would like the injections today.   Dx imaging: 12/04/22 R & L knee   12/23/21 R & L knee XR             03/02/20 R & L knee XR     Pertinent review of systems: No fevers or chills   Relevant historical information: Mild cognitive impairment.  History of a TBI. Spinal stenosis lumbar spine     Exam:  BP 110/60   Pulse 99   Ht 5' 6 (1.676 m)   SpO2 96%   BMI 35.05 kg/m  General: Well Developed, well nourished, and in no acute distress.    MSK: Knees bilaterally moderate effusion normal motion with crepitation.    07/23/2024 Patient states  Relevant Historical Information: ***  Additional pertinent review of systems negative.   Current Outpatient Medications:    acetaminophen  (TYLENOL ) 325 MG tablet, Take 2 tablets (650 mg total) by mouth every 6 (six) hours as needed for mild pain or headache., Disp:  , Rfl:    albuterol  (VENTOLIN  HFA) 108 (90 Base) MCG/ACT inhaler, INHALE 2 PUFFS INTO THE LUNGS EVERY 6 HOURS AS NEEDED FOR WHEEZING OR SHORTNESS OF BREATH (Patient not taking: Reported on 02/20/2024), Disp: 8.5 g, Rfl: 7   aspirin  81 MG EC tablet, Take 81 mg by mouth daily., Disp: , Rfl:    atorvastatin  (LIPITOR ) 80 MG tablet, TAKE 1 TABLET DAILY, Disp: 90 tablet, Rfl: 3    budesonide -formoterol  (SYMBICORT ) 160-4.5 MCG/ACT inhaler, Inhale 2 puffs into the lungs 2 (two) times daily., Disp: 6 g, Rfl: 2   Calcium  Carbonate (CALCIUM  500 PO), Take 1,000 mg by mouth 2 (two) times daily. , Disp: , Rfl:    cephALEXin  (KEFLEX ) 500 MG capsule, Take 1 tab by mouth three times weekly (mon - wed - fri), Disp: 36 capsule, Rfl: 1   cetirizine  (ZYRTEC ) 10 MG tablet, TAKE 1 TABLET DAILY (Patient taking differently: Take 10 mg by mouth daily as needed for allergies or rhinitis.), Disp: 90 tablet, Rfl: 2   citalopram  (CELEXA ) 40 MG tablet, Take 1 tablet (40 mg total) by mouth daily., Disp: 90 tablet, Rfl: 3   diclofenac  Sodium (VOLTAREN ) 1 % GEL, Apply 4 g topically 4 (four) times daily. Apply topically to affected area qid (Patient taking differently: Apply 4 g topically 3 (three) times daily as needed (Knee Pain).), Disp: 112 g, Rfl: 1   diphenoxylate -atropine  (LOMOTIL ) 2.5-0.025 MG tablet, Take 1 tablet by mouth 4 (four) times daily as needed for diarrhea or loose stools. (Patient taking differently: Take 1 tablet by mouth daily as needed for diarrhea or loose stools.), Disp: 30 tablet, Rfl: 0   ergocalciferol  (VITAMIN D2) 1.25 MG (50000 UT) capsule, Take 1 tablet by mouth  daily., Disp: , Rfl:    gabapentin  (NEURONTIN ) 100 MG capsule, TAKE 1 CAPSULE (100 MG TOTAL) BY MOUTH 3 TIMES A DAY, Disp: 270 capsule, Rfl: 1   GEMTESA 75 MG TABS, Take 1 tablet by mouth daily., Disp: , Rfl:    levothyroxine  (SYNTHROID ) 50 MCG tablet, TAKE 1 TABLET(50 MCG) BY MOUTH DAILY. NEEDS APPOINTMENT (Patient taking differently: Take 50 mcg by mouth daily before breakfast.), Disp: 90 tablet, Rfl: 3   metFORMIN  (GLUCOPHAGE ) 500 MG tablet, Take 1 tablet (500 mg total) by mouth 2 (two) times daily with a meal., Disp: 180 tablet, Rfl: 2   metoprolol  succinate (TOPROL -XL) 25 MG 24 hr tablet, TAKE 1 TABLET BY MOUTH EVERY DAY, Disp: 90 tablet, Rfl: 3   Multiple Vitamin (MULTIVITAMIN WITH MINERALS) TABS tablet, Take 1  tablet by mouth daily., Disp:  , Rfl:    pantoprazole  (PROTONIX ) 40 MG tablet, TAKE 1 TABLET BY MOUTH EVERY DAY, Disp: 90 tablet, Rfl: 3   polyethylene glycol powder (GLYCOLAX /MIRALAX ) 17 GM/SCOOP powder, Take 17 g by mouth 2 (two) times daily as needed. (Patient taking differently: Take 17 g by mouth 2 (two) times daily as needed for mild constipation or moderate constipation.), Disp: 3350 g, Rfl: 3   Semaglutide , 1 MG/DOSE, 4 MG/3ML SOPN, Inject 1 mg as directed once a week., Disp: 3 mL, Rfl: 3   traZODone  (DESYREL ) 50 MG tablet, TAKE 1 TABLET BY MOUTH EVERYDAY AT BEDTIME (Patient taking differently: Take 50 mg by mouth at bedtime. TAKE 1 TABLET BY MOUTH EVERYDAY AT BEDTIME), Disp: 90 tablet, Rfl: 3   trospium  (SANCTURA ) 20 MG tablet, Take 1 tablet (20 mg total) by mouth at bedtime., Disp: 90 tablet, Rfl: 0   Objective:     There were no vitals filed for this visit.    There is no height or weight on file to calculate BMI.    Physical Exam:    ***   Electronically signed by:  Odis Mace [facility] 7:35 AM 07/22/24

## 2024-07-23 ENCOUNTER — Ambulatory Visit: Admitting: Sports Medicine

## 2024-08-02 ENCOUNTER — Ambulatory Visit: Admitting: Internal Medicine

## 2024-08-03 ENCOUNTER — Ambulatory Visit: Admitting: Internal Medicine

## 2024-08-09 DIAGNOSIS — E119 Type 2 diabetes mellitus without complications: Secondary | ICD-10-CM | POA: Diagnosis not present

## 2024-08-26 ENCOUNTER — Other Ambulatory Visit: Payer: Self-pay | Admitting: Internal Medicine

## 2024-09-08 DIAGNOSIS — E119 Type 2 diabetes mellitus without complications: Secondary | ICD-10-CM | POA: Diagnosis not present

## 2024-09-25 ENCOUNTER — Emergency Department (HOSPITAL_COMMUNITY)

## 2024-09-25 ENCOUNTER — Other Ambulatory Visit: Payer: Self-pay

## 2024-09-25 ENCOUNTER — Emergency Department (HOSPITAL_COMMUNITY)
Admission: EM | Admit: 2024-09-25 | Discharge: 2024-09-25 | Disposition: A | Discharge status: Home / Self Care | Attending: Emergency Medicine | Admitting: Emergency Medicine

## 2024-09-25 ENCOUNTER — Encounter (HOSPITAL_COMMUNITY): Payer: Self-pay

## 2024-09-25 DIAGNOSIS — E039 Hypothyroidism, unspecified: Secondary | ICD-10-CM | POA: Diagnosis not present

## 2024-09-25 DIAGNOSIS — Z794 Long term (current) use of insulin: Secondary | ICD-10-CM | POA: Diagnosis not present

## 2024-09-25 DIAGNOSIS — Z79899 Other long term (current) drug therapy: Secondary | ICD-10-CM | POA: Insufficient documentation

## 2024-09-25 DIAGNOSIS — M549 Dorsalgia, unspecified: Secondary | ICD-10-CM | POA: Insufficient documentation

## 2024-09-25 DIAGNOSIS — I700 Atherosclerosis of aorta: Secondary | ICD-10-CM | POA: Diagnosis not present

## 2024-09-25 DIAGNOSIS — S8011XA Contusion of right lower leg, initial encounter: Secondary | ICD-10-CM | POA: Diagnosis not present

## 2024-09-25 DIAGNOSIS — R296 Repeated falls: Secondary | ICD-10-CM | POA: Insufficient documentation

## 2024-09-25 DIAGNOSIS — M47812 Spondylosis without myelopathy or radiculopathy, cervical region: Secondary | ICD-10-CM | POA: Diagnosis not present

## 2024-09-25 DIAGNOSIS — S3013XA Contusion of flank (latus) region, initial encounter: Secondary | ICD-10-CM | POA: Diagnosis not present

## 2024-09-25 DIAGNOSIS — R600 Localized edema: Secondary | ICD-10-CM | POA: Diagnosis not present

## 2024-09-25 DIAGNOSIS — S3991XA Unspecified injury of abdomen, initial encounter: Secondary | ICD-10-CM | POA: Insufficient documentation

## 2024-09-25 DIAGNOSIS — S8012XA Contusion of left lower leg, initial encounter: Secondary | ICD-10-CM | POA: Insufficient documentation

## 2024-09-25 DIAGNOSIS — W19XXXA Unspecified fall, initial encounter: Secondary | ICD-10-CM | POA: Insufficient documentation

## 2024-09-25 DIAGNOSIS — Z7982 Long term (current) use of aspirin: Secondary | ICD-10-CM | POA: Diagnosis not present

## 2024-09-25 DIAGNOSIS — M8580 Other specified disorders of bone density and structure, unspecified site: Secondary | ICD-10-CM | POA: Diagnosis not present

## 2024-09-25 DIAGNOSIS — R0602 Shortness of breath: Secondary | ICD-10-CM | POA: Insufficient documentation

## 2024-09-25 DIAGNOSIS — M160 Bilateral primary osteoarthritis of hip: Secondary | ICD-10-CM | POA: Diagnosis not present

## 2024-09-25 DIAGNOSIS — S0990XA Unspecified injury of head, initial encounter: Secondary | ICD-10-CM | POA: Diagnosis not present

## 2024-09-25 DIAGNOSIS — S299XXA Unspecified injury of thorax, initial encounter: Secondary | ICD-10-CM | POA: Diagnosis not present

## 2024-09-25 DIAGNOSIS — S20212A Contusion of left front wall of thorax, initial encounter: Secondary | ICD-10-CM | POA: Diagnosis not present

## 2024-09-25 DIAGNOSIS — S79912A Unspecified injury of left hip, initial encounter: Secondary | ICD-10-CM | POA: Diagnosis not present

## 2024-09-25 DIAGNOSIS — S7002XA Contusion of left hip, initial encounter: Secondary | ICD-10-CM | POA: Diagnosis not present

## 2024-09-25 LAB — COMPREHENSIVE METABOLIC PANEL WITH GFR
ALT: 21 U/L (ref 0–44)
AST: 29 U/L (ref 15–41)
Albumin: 3.7 g/dL (ref 3.5–5.0)
Alkaline Phosphatase: 75 U/L (ref 38–126)
Anion gap: 12 (ref 5–15)
BUN: 17 mg/dL (ref 8–23)
CO2: 19 mmol/L — ABNORMAL LOW (ref 22–32)
Calcium: 9.5 mg/dL (ref 8.9–10.3)
Chloride: 105 mmol/L (ref 98–111)
Creatinine, Ser: 1.14 mg/dL — ABNORMAL HIGH (ref 0.44–1.00)
GFR, Estimated: 49 mL/min — ABNORMAL LOW (ref >60)
Glucose, Bld: 136 mg/dL — ABNORMAL HIGH (ref 70–99)
Potassium: 3.9 mmol/L (ref 3.5–5.1)
Sodium: 136 mmol/L (ref 135–145)
Total Bilirubin: 0.4 mg/dL (ref 0.0–1.2)
Total Protein: 6.4 g/dL — ABNORMAL LOW (ref 6.5–8.1)

## 2024-09-25 LAB — CBC WITH DIFFERENTIAL/PLATELET
Abs Immature Granulocytes: 0.04 K/uL (ref 0.00–0.07)
Basophils Absolute: 0.1 K/uL (ref 0.0–0.1)
Basophils Relative: 1 %
Eosinophils Absolute: 0.1 K/uL (ref 0.0–0.5)
Eosinophils Relative: 1 %
HCT: 38.1 % (ref 36.0–46.0)
Hemoglobin: 12.4 g/dL (ref 12.0–15.0)
Immature Granulocytes: 0 %
Lymphocytes Relative: 33 %
Lymphs Abs: 3.8 K/uL (ref 0.7–4.0)
MCH: 27.1 pg (ref 26.0–34.0)
MCHC: 32.5 g/dL (ref 30.0–36.0)
MCV: 83.4 fL (ref 80.0–100.0)
Monocytes Absolute: 0.8 K/uL (ref 0.1–1.0)
Monocytes Relative: 7 %
Neutro Abs: 6.9 K/uL (ref 1.7–7.7)
Neutrophils Relative %: 58 %
Platelets: 417 K/uL — ABNORMAL HIGH (ref 150–400)
RBC: 4.57 MIL/uL (ref 3.87–5.11)
RDW: 14.5 % (ref 11.5–15.5)
WBC: 11.8 K/uL — ABNORMAL HIGH (ref 4.0–10.5)
nRBC: 0 % (ref 0.0–0.2)

## 2024-09-25 LAB — TROPONIN I (HIGH SENSITIVITY): Troponin I (High Sensitivity): 4 ng/L (ref <18)

## 2024-09-25 LAB — URINALYSIS, ROUTINE W REFLEX MICROSCOPIC
Bilirubin Urine: NEGATIVE
Glucose, UA: NEGATIVE mg/dL
Hgb urine dipstick: NEGATIVE
Ketones, ur: NEGATIVE mg/dL
Leukocytes,Ua: NEGATIVE
Nitrite: NEGATIVE
Protein, ur: NEGATIVE mg/dL
Specific Gravity, Urine: 1.012 (ref 1.005–1.030)
pH: 7 (ref 5.0–8.0)

## 2024-09-25 LAB — CK: Total CK: 108 U/L (ref 38–234)

## 2024-09-25 LAB — BRAIN NATRIURETIC PEPTIDE: B Natriuretic Peptide: 35.1 pg/mL (ref 0.0–100.0)

## 2024-09-25 MED ORDER — SODIUM CHLORIDE 0.9 % IV BOLUS: 500.0000 mL | Freq: Once | INTRAVENOUS | Status: DC

## 2024-09-25 MED ORDER — IOHEXOL 350 MG/ML SOLN
75.0000 mL | Freq: Once | INTRAVENOUS | Status: AC | PRN
  Administered 2024-09-25: 75 mL via INTRAVENOUS

## 2024-09-25 NOTE — Discharge Instructions (Signed)
 It was a pleasure taking care of you here today  We had recommended admission however you declined.  Make sure to follow-up outpatient, return for any worsening symptoms

## 2024-09-25 NOTE — ED Notes (Addendum)
 This NT ambulated the patient with a walker around the room with a distance of approximately 55ft. During the ambulation, pt. had a steady gait, her O2 remained on 100% and her HR was between 99-117 bpm.

## 2024-09-25 NOTE — ED Notes (Signed)
 ED Provider at bedside.

## 2024-09-25 NOTE — ED Triage Notes (Signed)
 Per EMS, Pt, from home, c/o increasing SOB x4-5 months increasing x 2days and increasing falls x4-67months.  Pt had a fall last night and c/o L rib cage pain and fell a couple days ago, c/o L hip pain.  Pain score 9/10.  Pt reports chronic bilateral shoulder and knee pain.  Pt reports she needs surgery but she's high risk.    EMS reported lungs are clear.

## 2024-09-25 NOTE — ED Notes (Signed)
 This RN has informed patient that there is an order for a troponin to be drawn and for IV fluids to be given. Pt responds I am going home. When asked if she would like those interventions patient states no. EDP and primary RN notified via secure chat.

## 2024-09-25 NOTE — ED Notes (Signed)
 Called son to see if he can pick patient up and take her home, son refuses to come get her. Will wait for PTAR.

## 2024-09-25 NOTE — ED Provider Notes (Signed)
 Care assumed from previous provider.  See note for full HPI.  In summation 78 year old here for evaluation of frequent falls.  Patient states he has history of similar.  She has chronic bilateral knee pain and back pain which limit her ambulation.  She uses a rollator at home.  She has [employer] Whe[employer] who delivers her food, son checks on her weekly.  Initial plan was to follow-up on imaging, admit for frequent falls, PT OT. Physical Exam  BP (!) 141/100   Pulse 87   Temp 97.7 F (36.5 C) (Oral)   Resp (!) 23   Ht 5' 6 (1.676 m)   Wt 98.4 kg   SpO2 100%   BMI 35.02 kg/m   Physical Exam Vitals and nursing note reviewed.  Constitutional:      General: She is not in acute distress.    Appearance: She is well-developed. She is not ill-appearing, toxic-appearing or diaphoretic.  HENT:     Head: Atraumatic.  Eyes:     Pupils: Pupils are equal, round, and reactive to light.  Cardiovascular:     Rate and Rhythm: Normal rate and regular rhythm.     Pulses: Normal pulses.     Heart sounds: Normal heart sounds.  Pulmonary:     Effort: Pulmonary effort is normal. No respiratory distress.     Breath sounds: Normal breath sounds.  Chest:     Comments: Nontender chest wall.  No crepitus or step-off Abdominal:     General: Bowel sounds are normal. There is no distension.     Palpations: Abdomen is soft.     Comments: Soft, nontender  Musculoskeletal:        General: Normal range of motion.     Cervical back: Normal range of motion and neck supple.     Right lower leg: No edema.     Left lower leg: No edema.     Comments: No midline C/T/L tenderness.  Mild tenderness bilateral anterior knees however full range of motion.  No obvious joint effusion.  No erythema.  Nontender pelvis.  Shortening or rotation of legs.  Nontender bile upper extremities  Skin:    General: Skin is warm and dry.     Comments: Multiple areas of varying ecchymosis to trunk and lower extremities.  Neurological:      General: No focal deficit present.     Mental Status: She is alert and oriented to person, place, and time.     Cranial Nerves: No cranial nerve deficit.     Procedures  Procedures Labs Reviewed  CBC WITH DIFFERENTIAL/PLATELET - Abnormal; Notable for the following components:      Result Value   WBC 11.8 (*)    Platelets 417 (*)    All other components within normal limits  COMPREHENSIVE METABOLIC PANEL WITH GFR - Abnormal; Notable for the following components:   CO2 19 (*)    Glucose, Bld 136 (*)    Creatinine, Ser 1.14 (*)    Total Protein 6.4 (*)    GFR, Estimated 49 (*)    All other components within normal limits  URINALYSIS, ROUTINE W REFLEX MICROSCOPIC - Abnormal; Notable for the following components:   APPearance HAZY (*)    All other components within normal limits  BRAIN NATRIURETIC PEPTIDE  CK  TROPONIN I (HIGH SENSITIVITY)  TROPONIN I (HIGH SENSITIVITY)   CT Angio Chest PE W and/or Wo Contrast Result Date: 09/25/2024 CLINICAL DATA:  Concern for pulmonary embolism. No abdominal or pain.  Trauma. EXAM: CT ANGIOGRAPHY CHEST CT ABDOMEN AND PELVIS WITH CONTRAST TECHNIQUE: Multidetector CT imaging of the chest was performed using the standard protocol during bolus administration of intravenous contrast. Multiplanar CT image reconstructions and MIPs were obtained to evaluate the vascular anatomy. Multidetector CT imaging of the abdomen and pelvis was performed using the standard protocol during bolus administration of intravenous contrast. RADIATION DOSE REDUCTION: This exam was performed according to the departmental dose-optimization program which includes automated exposure control, adjustment of the mA and/or kV according to patient size and/or use of iterative reconstruction technique. CONTRAST:  75mL OMNIPAQUE  IOHEXOL  350 MG/ML SOLN COMPARISON:  Chest radiograph dated 09/25/2024. FINDINGS: CTA CHEST FINDINGS Cardiovascular: There is no cardiomegaly or pericardial effusion.  There is coronary vascular calcification. Mild atherosclerotic calcification of the thoracic aorta. No aneurysmal dilatation or dissection. No pulmonary artery embolus identified. Mediastinum/Nodes: No hilar or mediastinal adenopathy. The esophagus is grossly unremarkable. No mediastinal fluid collection. Lungs/Pleura: No focal consolidation, pleural effusion, or pneumothorax. The central airways are patent. Musculoskeletal: Degenerative changes of the spine and shoulders. No acute osseous pathology. Bilateral breast nodular densities measure up to 1.3 x 2.2 cm on the right. Review of the MIP images confirms the above findings. CT ABDOMEN and PELVIS FINDINGS No intra-abdominal free air or free fluid. Hepatobiliary: The liver is unremarkable. No biliary ductal dilatation. Cholecystectomy. Pancreas: Unremarkable. No pancreatic ductal dilatation or surrounding inflammatory changes. Spleen: Normal in size without focal abnormality. Adrenals/Urinary Tract: The adrenal glands unremarkable. There is no hydronephrosis on either side. Slight heterogeneity of the renal parenchymal bilaterally. Correlation with urinalysis recommended to exclude pyelonephritis. The visualized ureters and urinary bladder appear unremarkable. Stomach/Bowel: There is no bowel obstruction or active inflammation. The appendix is not visualized with certainty. No inflammatory changes identified in the right lower quadrant. Vascular/Lymphatic: Mild aortoiliac atherosclerotic disease. The IVC is unremarkable. No portal venous gas. There is no adenopathy. Reproductive: Hysterectomy.  No suspicious adnexal masses. Other: Small fat containing umbilical hernia. Musculoskeletal: Osteopenia with degenerative changes of the spine. No acute osseous pathology. Review of the MIP images confirms the above findings. IMPRESSION: 1. No acute intrathoracic pathology. No CT evidence of pulmonary artery embolus. 2. Slight heterogeneity of the renal parenchymal  bilaterally. Correlation with urinalysis recommended to exclude pyelonephritis. No hydronephrosis. 3.  Aortic Atherosclerosis (ICD10-I70.0). Electronically Signed   By: Vanetta Chou M.D.   On: 09/25/2024 15:43   CT ABDOMEN PELVIS W CONTRAST Result Date: 09/25/2024 CLINICAL DATA:  Concern for pulmonary embolism. No abdominal or pain. Trauma. EXAM: CT ANGIOGRAPHY CHEST CT ABDOMEN AND PELVIS WITH CONTRAST TECHNIQUE: Multidetector CT imaging of the chest was performed using the standard protocol during bolus administration of intravenous contrast. Multiplanar CT image reconstructions and MIPs were obtained to evaluate the vascular anatomy. Multidetector CT imaging of the abdomen and pelvis was performed using the standard protocol during bolus administration of intravenous contrast. RADIATION DOSE REDUCTION: This exam was performed according to the departmental dose-optimization program which includes automated exposure control, adjustment of the mA and/or kV according to patient size and/or use of iterative reconstruction technique. CONTRAST:  75mL OMNIPAQUE  IOHEXOL  350 MG/ML SOLN COMPARISON:  Chest radiograph dated 09/25/2024. FINDINGS: CTA CHEST FINDINGS Cardiovascular: There is no cardiomegaly or pericardial effusion. There is coronary vascular calcification. Mild atherosclerotic calcification of the thoracic aorta. No aneurysmal dilatation or dissection. No pulmonary artery embolus identified. Mediastinum/Nodes: No hilar or mediastinal adenopathy. The esophagus is grossly unremarkable. No mediastinal fluid collection. Lungs/Pleura: No focal consolidation, pleural effusion, or pneumothorax. The central  airways are patent. Musculoskeletal: Degenerative changes of the spine and shoulders. No acute osseous pathology. Bilateral breast nodular densities measure up to 1.3 x 2.2 cm on the right. Review of the MIP images confirms the above findings. CT ABDOMEN and PELVIS FINDINGS No intra-abdominal free air or free  fluid. Hepatobiliary: The liver is unremarkable. No biliary ductal dilatation. Cholecystectomy. Pancreas: Unremarkable. No pancreatic ductal dilatation or surrounding inflammatory changes. Spleen: Normal in size without focal abnormality. Adrenals/Urinary Tract: The adrenal glands unremarkable. There is no hydronephrosis on either side. Slight heterogeneity of the renal parenchymal bilaterally. Correlation with urinalysis recommended to exclude pyelonephritis. The visualized ureters and urinary bladder appear unremarkable. Stomach/Bowel: There is no bowel obstruction or active inflammation. The appendix is not visualized with certainty. No inflammatory changes identified in the right lower quadrant. Vascular/Lymphatic: Mild aortoiliac atherosclerotic disease. The IVC is unremarkable. No portal venous gas. There is no adenopathy. Reproductive: Hysterectomy.  No suspicious adnexal masses. Other: Small fat containing umbilical hernia. Musculoskeletal: Osteopenia with degenerative changes of the spine. No acute osseous pathology. Review of the MIP images confirms the above findings. IMPRESSION: 1. No acute intrathoracic pathology. No CT evidence of pulmonary artery embolus. 2. Slight heterogeneity of the renal parenchymal bilaterally. Correlation with urinalysis recommended to exclude pyelonephritis. No hydronephrosis. 3.  Aortic Atherosclerosis (ICD10-I70.0). Electronically Signed   By: Vanetta Chou M.D.   On: 09/25/2024 15:43   CT Cervical Spine Wo Contrast Result Date: 09/25/2024 CLINICAL DATA:  Clemens, neck trauma EXAM: CT CERVICAL SPINE WITHOUT CONTRAST TECHNIQUE: Multidetector CT imaging of the cervical spine was performed without intravenous contrast. Multiplanar CT image reconstructions were also generated. RADIATION DOSE REDUCTION: This exam was performed according to the departmental dose-optimization program which includes automated exposure control, adjustment of the mA and/or kV according to patient  size and/or use of iterative reconstruction technique. COMPARISON:  02/19/2024 FINDINGS: Alignment: Alignment is grossly anatomic. Skull base and vertebrae: No acute fracture. No primary bone lesion or focal pathologic process. Soft tissues and spinal canal: No prevertebral fluid or swelling. No visible canal hematoma. Disc levels: Multilevel spondylosis, greatest at C3-4 and C5-6, with left predominant neural foraminal encroachment. Multilevel facet hypertrophy extending on the right from C2-3 through C5-6. Upper chest: Airway is patent. Visualized portions of the right lung apex is clear. Other: Reconstructed images demonstrate no additional findings. IMPRESSION: 1. No acute cervical spine fracture. 2. Stable multilevel cervical degenerative changes. Electronically Signed   By: Ozell Daring M.D.   On: 09/25/2024 15:42   CT Head Wo Contrast Result Date: 09/25/2024 CLINICAL DATA:  Head trauma, fell EXAM: CT HEAD WITHOUT CONTRAST TECHNIQUE: Contiguous axial images were obtained from the base of the skull through the vertex without intravenous contrast. RADIATION DOSE REDUCTION: This exam was performed according to the departmental dose-optimization program which includes automated exposure control, adjustment of the mA and/or kV according to patient size and/or use of iterative reconstruction technique. COMPARISON:  02/19/2024 FINDINGS: Brain: No acute infarct or hemorrhage. Lateral ventricles and midline structures are unremarkable. No acute extra-axial fluid collections. No mass effect. Vascular: No hyperdense vessel or unexpected calcification. Skull: Normal. Negative for fracture or focal lesion. Sinuses/Orbits: No acute finding. Other: None. IMPRESSION: 1. No acute intracranial process. Electronically Signed   By: Ozell Daring M.D.   On: 09/25/2024 15:39   DG Pelvis Portable Result Date: 09/25/2024 CLINICAL DATA:  Fall. EXAM: PORTABLE PELVIS 1-2 VIEWS COMPARISON:  Radiograph dated 02/19/2024.  FINDINGS: No acute fracture or dislocation. The bones are osteopenic. Moderate bilateral  hip arthritic changes. The soft tissues are unremarkable. IMPRESSION: 1. No acute fracture or dislocation. 2. Moderate bilateral hip arthritic changes. Electronically Signed   By: Vanetta Chou M.D.   On: 09/25/2024 13:40   DG Chest Portable 1 View Result Date: 09/25/2024 CLINICAL DATA:  Fall. EXAM: PORTABLE CHEST 1 VIEW COMPARISON:  Chest radiograph dated 02/19/2024. FINDINGS: No focal consolidation, pleural effusion or pneumothorax. Stable cardiac silhouette. Atherosclerotic calcification of the aorta. No acute osseous pathology. IMPRESSION: No active disease. Electronically Signed   By: Vanetta Chou M.D.   On: 09/25/2024 13:39    ED Course / MDM   Clinical Course as of 09/25/24 1758  Sat Sep 25, 2024  1630 Patient assessed at bedside after shift handoff.  She states she would like to go home.  I discussed risk versus benefit given her recurrent falls and her concern about going home and her taking care of herself.  Patient voiced understanding.  She has capacity make medical decisions.  She was still like to try to go home.  Will have staff ambulate her to see if she is able to get around. [BH]  1733 Patient ambulated here with walker which she uses at baseline.  Again discussed admission however patient declines.  She states she has key out her front door to get home.  PTAR will pick up patient. [BH]    Clinical Course User Index [BH] Zorina Mallin A, PA-C   Patient reassessed.  Discussed labs and imaging.  Discussed admission however patient declines.  We discussed risk versus benefit.  She states she has been in rehab previously and it did not help her much.  She states she does not want inpatient management.  She would like to be discharged home.  She is refusing IV fluids for her AKI as well as delta troponin.  She voiced understand risk versus benefit.  She has capacity make medical decisions.   Will see if she can ambulate here.  Nursing staff was able to ambulate patient with a walker which she uses at home.  She had steady gait.  Patient reassessed.  I again offered admission for her frequent falls however patient declines.  Will DC home.  I attempted to contact her family (Son) however no answer.  Will discharge home per patient's request.  I offered to have home health come and assess her however she declined homehealth evaluation with PT OT at home as well as nursing staff as well.  Medical Decision Making Amount and/or Complexity of Data Reviewed External Data Reviewed: labs, radiology, ECG and notes. Labs: ordered. Decision-making details documented in ED Course. Radiology: ordered and independent interpretation performed. Decision-making details documented in ED Course. ECG/medicine tests: ordered and independent interpretation performed. Decision-making details documented in ED Course.  Risk OTC drugs. Prescription drug management. Decision regarding hospitalization. Diagnosis or treatment significantly limited by social determinants of health.          Hurley Blevins A, PA-C 09/25/24 1758    Ruthe Cornet, DO 09/25/24 2012

## 2024-09-25 NOTE — ED Notes (Signed)
 Dinner tray ordered for patient.

## 2024-09-25 NOTE — ED Provider Notes (Signed)
 [name] EMERGENCY DEPARTMENT AT [facility] Provider Note   CSN: [id number] Arrival date & time: 09/25/24  1229     Patient presents with: Shortness of Breath and Fall   Alexandra Wolfe is a 78 y.o. female with history of hyperlipidemia, frequent falls, hypothyroidism, chronic knee pain presents to the emergency department today for evaluation of frequent falls.  Patient reports that she has had 2 falls in the past week.  Her first fall was a week ago when she was walking carrying her dinner tray to the living room when she tripped falling landing on her left hip.  She reports that she was on the ground for around 45 minutes after pushing her life alert button and EMS was able to stand her.  Additionally, she fell yesterday try to get out of her bed and landed on her bottom and hit her head on a device that she reports helps her stand.  No loss conscious.  Not having any head pain.  She reports that she noticed the bruising from her first fall and also the bruising to her back of her arm presents today from yesterday's fall.  She reports that her son was concerned with the bruising and wanted her to be evaluated.  Additionally, she reports that she has been having some shortness of breath worsening for the past few months but feels it has more been worse over the past few days.  She denies any chest pain.  Reports some bilateral lower leg swelling.  She denies any abdominal pain, nausea, vomiting or diarrhea, constipation, dysuria, or hematuria.  She reports that she feels that she cannot take care of herself any longer at home.  Denies any lightheadedness, dizziness, or syncope.  Reports it is difficult to ambulate at home given her chronic knee pain and that she is not a surgical candidate. No blood thinner use. She reports anxiety as well.   Shortness of Breath Associated symptoms: no abdominal pain, no chest pain, no cough, no fever, no headaches, no neck pain and no vomiting    Fall Associated symptoms include shortness of breath. Pertinent negatives include no chest pain, no abdominal pain and no headaches.       Prior to Admission medications   Medication Sig Start Date End Date Taking? Authorizing Provider  acetaminophen  (TYLENOL ) 325 MG tablet Take 2 tablets (650 mg total) by mouth every 6 (six) hours as needed for mild pain or headache. 03/02/20   Juvenal Raisin U, DO  albuterol  (VENTOLIN  HFA) 108 (90 Base) MCG/ACT inhaler INHALE 2 PUFFS INTO THE LUNGS EVERY 6 HOURS AS NEEDED FOR WHEEZING OR SHORTNESS OF BREATH Patient not taking: Reported on 02/20/2024 09/19/20   Norleen Lynwood ORN, MD  aspirin  81 MG EC tablet Take 81 mg by mouth daily.    [provider]  atorvastatin  (LIPITOR ) 80 MG tablet TAKE 1 TABLET DAILY 12/25/23   Norleen Lynwood ORN, MD  budesonide -formoterol  (SYMBICORT ) 160-4.5 MCG/ACT inhaler Inhale 2 puffs into the lungs 2 (two) times daily. 02/06/24   Norleen Lynwood ORN, MD  Calcium  Carbonate (CALCIUM  500 PO) Take 1,000 mg by mouth 2 (two) times daily.     [provider]  cephALEXin  (KEFLEX ) 500 MG capsule TAKE 1 CAPSULE BY MOUTH ON MONDAY, WEDNESDAY AND FRIDAY 08/30/24   Norleen Lynwood ORN, MD  cetirizine  (ZYRTEC ) 10 MG tablet TAKE 1 TABLET DAILY Patient taking differently: Take 10 mg by mouth daily as needed for allergies or rhinitis. 12/17/23   Norleen,  Lynwood ORN, MD  citalopram  (CELEXA ) 40 MG tablet Take 1 tablet (40 mg total) by mouth daily. 06/18/24   Norleen Lynwood ORN, MD  diclofenac  Sodium (VOLTAREN ) 1 % GEL Apply 4 g topically 4 (four) times daily. Apply topically to affected area qid Patient taking differently: Apply 4 g topically 3 (three) times daily as needed (Knee Pain). 01/09/24   Corey, Evan S, MD  diphenoxylate -atropine  (LOMOTIL ) 2.5-0.025 MG tablet Take 1 tablet by mouth 4 (four) times daily as needed for diarrhea or loose stools. Patient taking differently: Take 1 tablet by mouth daily as needed for diarrhea or loose stools. 08/20/23   Norleen Lynwood ORN,  MD  ergocalciferol  (VITAMIN D2) 1.25 MG (50000 UT) capsule Take 1 tablet by mouth daily.    [provider]  gabapentin  (NEURONTIN ) 100 MG capsule TAKE 1 CAPSULE (100 MG TOTAL) BY MOUTH 3 TIMES A DAY 05/06/24   Norleen Lynwood ORN, MD  GEMTESA 75 MG TABS Take 1 tablet by mouth daily. 01/05/24   [provider]  levothyroxine  (SYNTHROID ) 50 MCG tablet TAKE 1 TABLET(50 MCG) BY MOUTH DAILY. NEEDS APPOINTMENT Patient taking differently: Take 50 mcg by mouth daily before breakfast. 12/25/23   Norleen Lynwood ORN, MD  metFORMIN  (GLUCOPHAGE ) 500 MG tablet Take 1 tablet (500 mg total) by mouth 2 (two) times daily with a meal. 02/06/24   Norleen Lynwood ORN, MD  metoprolol  succinate (TOPROL -XL) 25 MG 24 hr tablet TAKE 1 TABLET BY MOUTH EVERY DAY 12/17/23   Norleen Lynwood ORN, MD  Multiple Vitamin (MULTIVITAMIN WITH MINERALS) TABS tablet Take 1 tablet by mouth daily. 03/02/20   Vann, Jessica U, DO  pantoprazole  (PROTONIX ) 40 MG tablet TAKE 1 TABLET BY MOUTH EVERY DAY 10/30/23   Norleen Lynwood ORN, MD  polyethylene glycol powder (GLYCOLAX /MIRALAX ) 17 GM/SCOOP powder Take 17 g by mouth 2 (two) times daily as needed. Patient taking differently: Take 17 g by mouth 2 (two) times daily as needed for mild constipation or moderate constipation. 12/14/21   Norleen Lynwood ORN, MD  Semaglutide , 1 MG/DOSE, 4 MG/3ML SOPN Inject 1 mg as directed once a week. 03/08/24   Norleen Lynwood ORN, MD  traZODone  (DESYREL ) 50 MG tablet TAKE 1 TABLET BY MOUTH EVERYDAY AT BEDTIME Patient taking differently: Take 50 mg by mouth at bedtime. TAKE 1 TABLET BY MOUTH EVERYDAY AT BEDTIME 10/30/23   Norleen Lynwood ORN, MD  trospium  (SANCTURA ) 20 MG tablet Take 1 tablet (20 mg total) by mouth at bedtime. 02/06/24   Norleen Lynwood ORN, MD    Allergies: Aripiprazole and Simvastatin    Review of Systems  Constitutional:  Negative for chills and fever.  Respiratory:  Positive for shortness of breath. Negative for cough.   Cardiovascular:  Negative for chest pain.  Gastrointestinal:   Negative for abdominal pain, nausea and vomiting.  Genitourinary:  Negative for dysuria and hematuria.  Musculoskeletal:  Positive for arthralgias. Negative for neck pain.  Neurological:  Negative for light-headedness and headaches.    Updated Vital Signs BP 139/63   Pulse 87   Temp 98.1 F (36.7 C) (Oral)   Resp (!) 28   Ht 5' 6 (1.676 m)   Wt 98.4 kg   SpO2 98%   BMI 35.02 kg/m   Physical Exam Vitals and nursing note reviewed.  Constitutional:      Appearance: She is not toxic-appearing.     Comments: Anxious appearing  Cardiovascular:     Rate and Rhythm: Normal rate.  Pulmonary:  Effort: Pulmonary effort is normal.     Breath sounds: No decreased breath sounds.     Comments: Patient is able to speak in full sentences with ease, sats on room air without any increased work of breathing while having a session however whenever she stops, she starts to hyperventilate.  This is likely anxiety related.  Her lung sounds are clear to auscultation bilaterally and throughout all lung fields. Chest:     Chest wall: No tenderness.  Abdominal:     Palpations: Abdomen is soft.     Tenderness: There is no abdominal tenderness.  Musculoskeletal:     Right lower leg: Edema present.     Left lower leg: Edema present.     Comments: 1+ edema bilaterally  Skin:    General: Skin is warm and dry.         Comments: Large old bruising present to the area marked above.  Soft.  No induration or fluctuance.  Neurological:     Mental Status: She is alert and oriented to person, place, and time.     GCS: GCS eye subscore is 4. GCS verbal subscore is 5. GCS motor subscore is 6.     Cranial Nerves: No cranial nerve deficit, dysarthria or facial asymmetry.     Motor: Weakness present.     Comments: Generalized weakness but no focal weakness.  Oriented x 3.     (all labs ordered are listed, but only abnormal results are displayed) Labs Reviewed  CBC WITH DIFFERENTIAL/PLATELET   COMPREHENSIVE METABOLIC PANEL WITH GFR  URINALYSIS, ROUTINE W REFLEX MICROSCOPIC  BRAIN NATRIURETIC PEPTIDE  CK  TROPONIN I (HIGH SENSITIVITY)    EKG: None  Radiology: No results found.  Procedures   Medications Ordered in the ED - No data to display  Clinical Course as of 09/25/24 1655  Sat Sep 25, 2024  1630 Patient assessed at bedside after shift handoff.  She states she would like to go home.  I discussed risk versus benefit given her recurrent falls and her concern about going home and her taking care of herself.  Patient voiced understanding.  She has capacity make medical decisions.  She was still like to try to go home.  Will have staff ambulate her to see if she is able to get around. [BH]    Clinical Course User Index [BH] Henderly, Britni A, PA-C                               Medical Decision Making Amount and/or Complexity of Data Reviewed Labs: ordered. Radiology: ordered.  Risk Prescription drug management.   78 y.o. female presents to the ER for evaluation of frequent mechanical falls with left hip pain and bruising as well as worsening SOB. Differential diagnosis includes but is not limited to trauma, electrolyte abnormality, chronic pain, CHF, pericardial effusion/tamponade, arrhythmias, ACS, COPD, asthma, bronchitis, pneumonia, pneumothorax, PE, anemia . Vital signs elevated BP otherwise unremarkable. Physical exam as noted above.   Patient reports that she is experiencing some worsening shortness regimen past few months but worsened past few days.  Given her sedentary lifestyle, have added on CTA of her chest given they will need to be scanned regardless due to her multiple falls and bruising seen to the area.  She is not hypoxic and does not appear to be tachypneic.  She does appear to be very anxious.  She was speaking full sentences without the need  to take a rest or stop however whenever she stops talking she starts to hyperventilate.  She reports that she  is on clonazepam  at home for her anxiety and reports that she does feel anxious as well.  She is neuro vastly intact distally with some generalized weakness but no focal weakness.  She does have a large bruising seen to her left hip/flank/lower chest.  Will obtain CT imaging of this.  Reports that she hit her head however she is not having any headache or neck pain.  Given her age, will order CT of her head and neck however.  Her extremities are at their chronic state.  She does have some knee pain and her reports that her baseline.  No worsening.  Otherwise has no other tenderness.  CT and labs pending. Will hand off to oncoming shift to follow up with. The patient is requesting a SNF given that she can't take care of herself anymore.   Care of Rojelio ONEIDA Rakes transferred to PA Henderly at the end of my shift as the patient will require reassessment once labs/imaging have resulted. Patient presentation, ED course, and plan of care discussed with review of all pertinent labs and imaging. Please see his/her note for further details regarding further ED course and disposition. Plan at time of handoff is follow up with labs and imaging. This may be altered or completely changed at the discretion of the oncoming team pending results of further workup.   Portions of this report may have been transcribed using voice recognition software. Every effort was made to ensure accuracy; however, inadvertent computerized transcription errors may be present.   Final diagnoses:  None    ED Discharge Orders     None          Bernis Ernst, [place] 09/25/24 1706    Dreama Longs, MD 09/27/24 1135

## 2024-10-09 DIAGNOSIS — E119 Type 2 diabetes mellitus without complications: Secondary | ICD-10-CM | POA: Diagnosis not present

## 2024-10-12 DIAGNOSIS — R262 Difficulty in walking, not elsewhere classified: Secondary | ICD-10-CM | POA: Diagnosis not present

## 2024-10-12 DIAGNOSIS — M25561 Pain in right knee: Secondary | ICD-10-CM | POA: Diagnosis not present

## 2024-10-12 DIAGNOSIS — M170 Bilateral primary osteoarthritis of knee: Secondary | ICD-10-CM | POA: Diagnosis not present

## 2024-10-12 DIAGNOSIS — M25562 Pain in left knee: Secondary | ICD-10-CM | POA: Diagnosis not present

## 2024-10-20 DIAGNOSIS — R262 Difficulty in walking, not elsewhere classified: Secondary | ICD-10-CM | POA: Diagnosis not present

## 2024-10-20 DIAGNOSIS — M25562 Pain in left knee: Secondary | ICD-10-CM | POA: Diagnosis not present

## 2024-10-20 DIAGNOSIS — M25561 Pain in right knee: Secondary | ICD-10-CM | POA: Diagnosis not present

## 2024-10-20 DIAGNOSIS — M170 Bilateral primary osteoarthritis of knee: Secondary | ICD-10-CM | POA: Diagnosis not present

## 2024-10-27 DIAGNOSIS — R350 Frequency of micturition: Secondary | ICD-10-CM | POA: Diagnosis not present

## 2024-10-27 DIAGNOSIS — R351 Nocturia: Secondary | ICD-10-CM | POA: Diagnosis not present

## 2024-10-27 DIAGNOSIS — N3281 Overactive bladder: Secondary | ICD-10-CM | POA: Diagnosis not present

## 2024-10-29 ENCOUNTER — Ambulatory Visit: Payer: Self-pay

## 2024-10-29 NOTE — Telephone Encounter (Signed)
 FYI Only or Action Required?: Action required by provider: clinical question for provider.  Patient was last seen in primary care on 03/31/2024 by Norleen Lynwood ORN, MD.  Called Nurse Triage reporting Back Pain.  Symptoms began yesterday.  Interventions attempted: Prescription medications: keflex , was prescribed a second atbx but has not started it.  Symptoms are: gradually worsening.  Triage Disposition: Home Care  Patient/caregiver understands and will follow disposition?: Yes  Copied from CRM #[id number]. Topic: Clinical - Medical Advice >> Oct 29, 2024  4:15 PM Ashley R wrote: Reason for CRM: ED visit, labs came back for bladder infection, prescribed new medication and would like to know if she should continue the antibiotic he prescribed. Reason for Disposition  [1] Taking antibiotic < 72 hours (3 days) for UTI AND [2] painful urination or frequency is SAME (unchanged, not better)  Answer Assessment - Initial Assessment Questions Pt states that she has been to the Er multiple times. She states she was hospitalized for 5 days with sepsis and then the bladder infection came back. She states Dr. Norleen started her on an atbx and she went and saw her urologist yesterday and her bladder infection is back. She states she had no symptoms of a bladder infection when she saw him. So she thinks the antibiotic just covered up the signs. She states he did have her start a new medication, she is not sure what the medication is and she hasn't started it yet. She states her son is supposed to bring her new medicine over tonight. She does say that she is having left sided back pain, feels the urge to go and really has to push to get it out, and is tired and weak.  She would just like to know if she is supposed to continue the Keflex  on top of the medication the urologist prescribed.     1. MAIN SYMPTOM: What is the main symptom you are concerned about? (e.g., painful urination, urine frequency)     Just  feels weak, tired, urgency to pee 2. BETTER-SAME-WORSE: Are you getting better, staying the same, or getting worse compared to how you felt at your last visit to the doctor (most recent medical visit)?     Slightly worse 3. PAIN: How bad is the pain?  (e.g., Scale 1-10; mild, moderate, or severe)     No pain 4. FEVER: Do you have a fever? If Yes, ask: What is it, how was it measured, and when did it start?     denies 5. OTHER SYMPTOMS: Do you have any other symptoms? (e.g., blood in the urine, flank pain, vaginal discharge)     Back pain 6. DIAGNOSIS: When was the UTI diagnosed? By whom? Was it a kidney infection, bladder infection or both?     Just this week but hasn't started new antibiotic. 7. ANTIBIOTIC: What antibiotic(s) are you taking? How many times per day?     Hasn't started it yet and is unsure what is icalled 8. ANTIBIOTIC - START DATE: When did you start taking the antibiotic?     Current atbx is keflex  3 days a week as a preventative.  Protocols used: Urinary Tract Infection on Antibiotic Follow-up Call - [facility]

## 2024-11-01 NOTE — Telephone Encounter (Signed)
 Sorry for the confusion; I thought I was trying to do the right thing with prescribing the keflex , but in this case I would defer to Urology and let pt know she should only take the antibiotic per urology   thanks

## 2024-11-03 DIAGNOSIS — M170 Bilateral primary osteoarthritis of knee: Secondary | ICD-10-CM | POA: Diagnosis not present

## 2024-11-03 DIAGNOSIS — R262 Difficulty in walking, not elsewhere classified: Secondary | ICD-10-CM | POA: Diagnosis not present

## 2024-11-03 DIAGNOSIS — M25562 Pain in left knee: Secondary | ICD-10-CM | POA: Diagnosis not present

## 2024-11-03 DIAGNOSIS — M25561 Pain in right knee: Secondary | ICD-10-CM | POA: Diagnosis not present

## 2024-11-11 ENCOUNTER — Ambulatory Visit: Payer: Self-pay

## 2024-11-11 ENCOUNTER — Other Ambulatory Visit: Payer: Self-pay | Admitting: Internal Medicine

## 2024-11-11 NOTE — Telephone Encounter (Signed)
 Pt stated that she is having some weakness and low energy. Pt stated that it may be due to bladder infection for which she is being treated for. Pt has a wellness visit on 12/11 and did not want to be seen sooner. Pt stated will call if sx persist or become worse.

## 2024-11-11 NOTE — Telephone Encounter (Signed)
 FYI Only or Action Required?: FYI only for provider: ED advised. ED declined  Patient was last seen in primary care on 03/31/2024 by Norleen Lynwood ORN, MD.  Called Nurse Triage reporting Weakness.  Symptoms began a week ago.  Interventions attempted: Nothing.  Symptoms are: unchanged.  Triage Disposition: Go to ED Now (Notify PCP)  Patient/caregiver understands and will follow disposition?: No, wishes to speak with PCP   Copied from CRM #[id number]. Topic: Clinical - Red Word Triage >> Nov 11, 2024  1:34 PM Franky GRADE wrote: Red Word that prompted transfer to Nurse Triage: Patient son is calling because patient has recently been feeling weak, and low on energy. Reason for Disposition  [1] MODERATE difficulty breathing (e.g., speaks in phrases, SOB even at rest, pulse 100-120) AND [2] NEW-onset or WORSE than normal  Answer Assessment - Initial Assessment Questions 1. DESCRIPTION: Describe how you are feeling.     Weakness,low energy Sob rest and exertion; have to talk to self to motivate to get up 2. SEVERITY: How bad is it?  Can you stand and walk?     yes 3. ONSET: When did these symptoms begin? (e.g., hours, days, weeks, months)     Last week; uti 4. CAUSE: What do you think is causing the weakness or fatigue? (e.g., not drinking enough fluids, medical problem, trouble sleeping)     Not eating drinking much, still wet diapers 5. NEW MEDICINES:  Have you started on any new medicines recently? (e.g., opioid pain medicines, benzodiazepines, muscle relaxants, antidepressants, antihistamines, neuroleptics, beta blockers)     no 6. OTHER SYMPTOMS: Do you have any other symptoms? (e.g., chest pain, fever, cough, SOB, vomiting, diarrhea, bleeding, other areas of pain)     Denies chest pain,  Sob rest and exertion  Answer Assessment - Initial Assessment Questions Advised ED now.  Patient reports will have to see if son will take her.  Advised 911 if symptoms worsen.  Nurse  called pt's son and advised ED. Pt's son declined ED at this time. Reports she's been dealing with sob for a long time.  Patient's son requesting appt.  1. RESPIRATORY STATUS: Describe your breathing? (e.g., wheezing, shortness of breath, unable to speak, severe coughing)      Sob with rest and exertion; pt reports does not have rescue inhaler 2. ONSET: When did this breathing problem begin?      Week ago 3. PATTERN Does the difficult breathing come and go, or has it been constant since it started?      Comes and goes 4. SEVERITY: How bad is your breathing? (e.g., mild, moderate, severe)   moderate  Protocols used: Weakness (Generalized) and Fatigue-A-AH, Breathing Difficulty-A-AH

## 2024-11-11 NOTE — Telephone Encounter (Signed)
 Received call from patient's son reporting pt's been having weakness and low energy, but it is getting worse.  Patient is currently not with son. Nurse will call patient.  Pt's son appt availability: 12/9 anytime and 12/10 only mid morning.

## 2024-11-12 ENCOUNTER — Other Ambulatory Visit: Payer: Self-pay

## 2024-11-12 NOTE — Telephone Encounter (Signed)
 Tried to reach pt to inform her of the following  Sorry for the confusion; I thought I was trying to do the right thing with prescribing the keflex , but in this case I would defer to Urology and let pt know she should only take the antibiotic per urology   thanks  '

## 2024-11-18 ENCOUNTER — Ambulatory Visit (INDEPENDENT_AMBULATORY_CARE_PROVIDER_SITE_OTHER)

## 2024-11-18 VITALS — Ht 66.0 in | Wt 217.0 lb

## 2024-11-18 DIAGNOSIS — Z0000 Encounter for general adult medical examination without abnormal findings: Secondary | ICD-10-CM

## 2024-11-18 NOTE — Progress Notes (Signed)
 Chief Complaint  Patient presents with   Medicare Wellness     Subjective:   Alexandra Wolfe is a 78 y.o. female who presents for a Medicare Annual Wellness Visit.  Visit info / Clinical Intake: Medicare Wellness Visit Type:: Subsequent Annual Wellness Visit Persons participating in visit and providing information:: patient Medicare Wellness Visit Mode:: Telephone If telephone:: video declined Since this visit was completed virtually, some vitals may be partially provided or unavailable. Missing vitals are due to the limitations of the virtual format.: Unable to obtain vitals - no equipment If Telephone or Video please confirm:: I connected with patient using audio/video enable telemedicine. I verified patient identity with two identifiers, discussed telehealth limitations, and patient agreed to proceed. Patient Location:: Home Provider Location:: Home Interpreter Needed?: No Pre-visit prep was completed: yes AWV questionnaire completed by patient prior to visit?: no Living arrangements:: (!) lives alone Patient's Overall Health Status Rating: good Typical amount of pain: some (knee pain) Does pain affect daily life?: no Are you currently prescribed opioids?: no  Dietary Habits and Nutritional Risks How many meals a day?: 2 Eats fruit and vegetables daily?: yes Most meals are obtained by: having others provide food (Meals on Wheels) In the last 2 weeks, have you had any of the following?: none Diabetic:: (!) yes Any non-healing wounds?: no How often do you check your BS?: as needed (every other day-per pt) Would you like to be referred to a Nutritionist or for Diabetic Management? : no  Functional Status Activities of Daily Living (to include ambulation/medication): Independent Ambulation: Independent with device- listed below Home Assistive Devices/Equipment: Wheelchair; Eyeglasses Medication Administration: Independent Home Management (perform basic housework or  laundry): Independent Manage your own finances?: yes Primary transportation is: family / friends (son drives her around) Concerns about vision?: no *vision screening is required for [facility]* Concerns about hearing?: (!) yes (would like to get hearing checked) Uses hearing aids?: no Hear whispered voice?: (!) no *in-person visit only*  Fall Screening Falls in the past year?: 1 Number of falls in past year: 1 Was there an injury with Fall?: 1 Fall Risk Category Calculator: 3 Patient Fall Risk Level: High Fall Risk  Fall Risk Patient at Risk for Falls Due to: Impaired balance/gait Fall risk Follow up: Falls evaluation completed; Falls prevention discussed  Home and Transportation Safety: All rugs have non-skid backing?: yes All stairs or steps have railings?: N/A, no stairs Grab bars in the bathtub or shower?: yes Have non-skid surface in bathtub or shower?: yes Good home lighting?: yes Regular seat belt use?: yes Hospital stays in the last year:: (!) yes How many hospital stays:: 5 Reason: sepsis  Advance Directives (For Healthcare) Does Patient Have a Medical Advance Directive?: Yes Does patient want to make changes to medical advance directive?: No - Patient declined Type of Advance Directive: Living will    Allergies (verified) Aripiprazole and Simvastatin   Current Medications (verified) Outpatient Encounter Medications as of 11/18/2024  Medication Sig   acetaminophen  (TYLENOL ) 325 MG tablet Take 2 tablets (650 mg total) by mouth every 6 (six) hours as needed for mild pain or headache.   aspirin  81 MG EC tablet Take 81 mg by mouth daily.   atorvastatin  (LIPITOR ) 80 MG tablet TAKE 1 TABLET DAILY   budesonide -formoterol  (SYMBICORT ) 160-4.5 MCG/ACT inhaler Inhale 2 puffs into the lungs 2 (two) times daily.   citalopram  (CELEXA ) 40 MG tablet Take 1 tablet (40 mg total) by mouth daily.   diclofenac  Sodium (VOLTAREN )  1 % GEL Apply 4 g topically 4 (four) times daily. Apply  topically to affected area qid   diphenoxylate -atropine  (LOMOTIL ) 2.5-0.025 MG tablet Take 1 tablet by mouth 4 (four) times daily as needed for diarrhea or loose stools.   ergocalciferol  (VITAMIN D2) 1.25 MG (50000 UT) capsule Take 1 tablet by mouth daily.   gabapentin  (NEURONTIN ) 100 MG capsule TAKE 1 CAPSULE BY MOUTH THREE TIMES DAILY   levothyroxine  (SYNTHROID ) 50 MCG tablet TAKE 1 TABLET(50 MCG) BY MOUTH DAILY. NEEDS APPOINTMENT   metFORMIN  (GLUCOPHAGE ) 500 MG tablet Take 1 tablet (500 mg total) by mouth 2 (two) times daily with a meal.   metoprolol  succinate (TOPROL -XL) 25 MG 24 hr tablet TAKE 1 TABLET BY MOUTH EVERY DAY   Multiple Vitamin (MULTIVITAMIN WITH MINERALS) TABS tablet Take 1 tablet by mouth daily.   pantoprazole  (PROTONIX ) 40 MG tablet TAKE 1 TABLET BY MOUTH EVERY DAY   traZODone  (DESYREL ) 50 MG tablet TAKE 1 TABLET BY MOUTH EVERYDAY AT BEDTIME   trospium  (SANCTURA ) 20 MG tablet Take 1 tablet (20 mg total) by mouth at bedtime.   albuterol  (VENTOLIN  HFA) 108 (90 Base) MCG/ACT inhaler INHALE 2 PUFFS INTO THE LUNGS EVERY 6 HOURS AS NEEDED FOR WHEEZING OR SHORTNESS OF BREATH (Patient not taking: Reported on 11/18/2024)   Calcium  Carbonate (CALCIUM  500 PO) Take 1,000 mg by mouth 2 (two) times daily.  (Patient not taking: Reported on 11/18/2024)   cephALEXin  (KEFLEX ) 500 MG capsule TAKE 1 CAPSULE BY MOUTH ON MONDAY, WEDNESDAY AND FRIDAY   cetirizine  (ZYRTEC ) 10 MG tablet TAKE 1 TABLET DAILY (Patient not taking: Reported on 11/18/2024)   GEMTESA 75 MG TABS Take 1 tablet by mouth daily. (Patient not taking: Reported on 11/18/2024)   polyethylene glycol powder (GLYCOLAX /MIRALAX ) 17 GM/SCOOP powder Take 17 g by mouth 2 (two) times daily as needed. (Patient not taking: Reported on 11/18/2024)   Semaglutide , 1 MG/DOSE, 4 MG/3ML SOPN Inject 1 mg as directed once a week. (Patient not taking: Reported on 11/18/2024)   No facility-administered encounter medications on file as of 11/18/2024.     History: Past Medical History:  Diagnosis Date   ALLERGIC RHINITIS 04/02/2009   no per pt   Anxiety    BACK PAIN 09/27/2008   BUNIONS, BILATERAL 12/23/2007   CHEST DISCOMFORT, ATYPICAL 11/07/2009   Chronic LBP    COLONIC POLYPS, HX OF 09/13/2007   CONSTIPATION 09/27/2008   DEPRESSION 09/13/2007   Diabetes mellitus    diet controlled   DYSPNEA ON EXERTION 02/06/2010   Eustachian tube dysfunction 05/20/2011   FOOT PAIN, RIGHT 09/27/2008   HYPERLIPIDEMIA 03/11/2008   LBP (low back pain) 05/20/2011   Left otitis media 08/07/2022   Leukocytosis 11/20/2011   OSTEOARTHROSIS NOS, LOWER LEG 09/13/2007   SHOULDER PAIN, LEFT 02/14/2009   SVT (supraventricular tachycardia)    SYMPTOM, PALPITATIONS 09/13/2007   Traumatic brain injury (HCC) 03/21/2021   URINARY INCONTINENCE 08/23/2009   Vertigo 05/20/2011   Past Surgical History:  Procedure Laterality Date   BUNIONECTOMY     right   ccx     CHOLECYSTECTOMY     LUMBAR LAMINECTOMY     LUMBAR LAMINECTOMY     SHOULDER ARTHROSCOPY  12/13/2011   Procedure: ARTHROSCOPY SHOULDER;  Surgeon: Elspeth JONELLE Her;  Location: [facility] OR;  Service: Orthopedics;  Laterality: Left;  Left Shoulder ArthroscopyDebridement Limited Tenodesis Open Rotator Cuff Repair Spur Removal Right Shoulder Injection    Family History  Problem Relation Age of Onset   Colon cancer Mother  Heart disease Father    Diabetes type II Father    Heart disease Brother    Diabetes Other        father   Esophageal cancer Neg Hx    Stomach cancer Neg Hx    Rectal cancer Neg Hx    Social History   Occupational History   Occupation: Disabled Psych.  Tobacco Use   Smoking status: Former    Current packs/day: 1.00    Average packs/day: 1 pack/day for 10.0 years (10.0 ttl pk-yrs)    Types: Cigarettes   Smokeless tobacco: Never   Tobacco comments:    Smoked as a teenager. Quit in 1970  Vaping Use   Vaping status: Never Used  Substance and Sexual Activity   Alcohol  use: No    Alcohol/week: 0.0 standard drinks of alcohol   Drug use: No   Sexual activity: Never   Tobacco Counseling Counseling given: Not Answered Tobacco comments: Smoked as a teenager. Quit in 1970  SDOH Screenings   Food Insecurity: No Food Insecurity (04/02/2024)  Housing: Low Risk (04/02/2024)  Transportation Needs: No Transportation Needs (04/02/2024)  Utilities: Not At Risk (04/02/2024)  Alcohol Screen: Low Risk (06/25/2023)  Depression (PHQ2-9): Low Risk (03/31/2024)  Financial Resource Strain: Low Risk (04/02/2024)  Physical Activity: Inactive (06/25/2023)  Social Connections: Socially Isolated (02/19/2024)  Stress: No Stress Concern Present (06/25/2023)  Tobacco Use: Medium Risk (11/18/2024)  Health Literacy: Adequate Health Literacy (06/25/2023)   See flowsheets for full screening details  Depression Screen PHQ 2 & 9 Depression Scale- Over the past 2 weeks, how often have you been bothered by any of the following problems? Little interest or pleasure in doing things: 0 Feeling down, depressed, or hopeless (PHQ Adolescent also includes...irritable): 0 PHQ-2 Total Score: 0     Goals Addressed               This Visit's Progress     Patient Stated (pt-stated)               Objective:    Today's Vitals   11/18/24 1310  Weight: 217 lb (98.4 kg)  Height: 5' 6 (1.676 m)   Body mass index is 35.02 kg/m.  Hearing/Vision screen No results found. Immunizations and Health Maintenance Health Maintenance  Topic Date Due   Zoster Vaccines- Shingrix (1 of 2) Never done   Colonoscopy  03/20/2022   DTaP/Tdap/Td (2 - Td or Tdap) 11/09/2022   Diabetic kidney evaluation - Urine ACR  12/14/2022   OPHTHALMOLOGY EXAM  05/14/2023   Influenza Vaccine  07/09/2024   HEMOGLOBIN A1C  08/21/2024   FOOT EXAM  03/31/2025   Diabetic kidney evaluation - eGFR measurement  09/25/2025   Medicare Annual Wellness (AWV)  11/18/2025   Pneumococcal Vaccine: 50+ Years  Completed    Bone Density Scan  Completed   Hepatitis C Screening  Completed   Meningococcal B Vaccine  Aged Out   Mammogram  Discontinued   COVID-19 Vaccine  Discontinued        Assessment/Plan:  This is a routine wellness examination for Alexandra Wolfe.  Patient Care Team: Norleen Lynwood ORN, MD as PCP - General (Internal Medicine) Waylan Cain, MD as Consulting Physician (Ophthalmology)  I have personally reviewed and noted the following in the patients chart:   Medical and social history Use of alcohol, tobacco or illicit drugs  Current medications and supplements including opioid prescriptions. Functional ability and status Nutritional status Physical activity Advanced directives List of other physicians Hospitalizations,  surgeries, and ER visits in previous 12 months Vitals Screenings to include cognitive, depression, and falls Referrals and appointments  No orders of the defined types were placed in this encounter.  In addition, I have reviewed and discussed with patient certain preventive protocols, quality metrics, and best practice recommendations. A written personalized care plan for preventive services as well as general preventive health recommendations were provided to patient.   Kylene Zamarron L Lynze Reddy, CMA   11/18/2024   Return in 1 year (on 11/18/2025).  After Visit Summary: (Mail) Due to this being a telephonic visit, the after visit summary with patients personalized plan was offered to patient via mail   Nurse Notes: Patient is due for a diabetic eye exam, an A1c check, a UACR, a Tdap and a flu vaccine.  Patient stated that she will ask her son to call office and get scheduled for a visit. She had no other concerns to address today.

## 2024-11-18 NOTE — Patient Instructions (Addendum)
 Alexandra Wolfe,  Thank you for taking the time for your Medicare Wellness Visit. I appreciate your continued commitment to your health goals. Please review the care plan we discussed, and feel free to reach out if I can assist you further.  Please note that Annual Wellness Visits do not include a physical exam. Some assessments may be limited, especially if the visit was conducted virtually. If needed, we may recommend an in-person follow-up with your provider.  Ongoing Care Seeing your primary care provider every 3 to 6 months helps us  monitor your health and provide consistent, personalized care. Last office visit on 03/31/2024.  You are due for a tetanus vaccine, a flu vaccine and can get these done at your local pharmacy.  You are due for a diabetic eye exam, please call and get scheduled.  You are also due for a kidney evaluation and a A1c check, which will be done at your next office visit.  Please call the office to get scheduled for a follow up visit. Each day, aim for 6 glasses of water, plenty of protein in your diet and try to get up and walk/ stretch every hour for 5-10 minutes at a time.    Referrals If a referral was made during today's visit and you haven't received any updates within two weeks, please contact the referred provider directly to check on the status.  Recommended Screenings:  Health Maintenance  Topic Date Due   Zoster (Shingles) Vaccine (1 of 2) Never done   DTaP/Tdap/Td vaccine (2 - Td or Tdap) 11/09/2022   Yearly kidney health urinalysis for diabetes  12/14/2022   Eye exam for diabetics  05/14/2023   Flu Shot  07/09/2024   Hemoglobin A1C  08/21/2024   Complete foot exam   03/31/2025   Cologuard (Stool DNA test)  09/02/2025   Yearly kidney function blood test for diabetes  09/25/2025   Medicare Annual Wellness Visit  11/18/2025   Pneumococcal Vaccine for age over 48  Completed   Osteoporosis screening with Bone Density Scan  Completed   Hepatitis C Screening   Completed   Meningitis B Vaccine  Aged Out   Breast Cancer Screening  Discontinued   Colon Cancer Screening  Discontinued   COVID-19 Vaccine  Discontinued       11/18/2024    1:29 PM  Advanced Directives  Does Patient Have a Medical Advance Directive? Yes  Type of Advance Directive Healthcare Power of Attorney  Copy of Healthcare Power of Attorney in Chart? No - copy requested    Vision: Annual vision screenings are recommended for early detection of glaucoma, cataracts, and diabetic retinopathy. These exams can also reveal signs of chronic conditions such as diabetes and high blood pressure.  Dental: Annual dental screenings help detect early signs of oral cancer, gum disease, and other conditions linked to overall health, including heart disease and diabetes.  Please see the attached documents for additional preventive care recommendations.

## 2024-12-03 ENCOUNTER — Observation Stay (HOSPITAL_COMMUNITY)
Admission: EM | Admit: 2024-12-03 | Source: Home / Self Care | Attending: Emergency Medicine | Admitting: Emergency Medicine

## 2024-12-03 ENCOUNTER — Other Ambulatory Visit: Payer: Self-pay

## 2024-12-03 ENCOUNTER — Emergency Department (HOSPITAL_COMMUNITY)

## 2024-12-03 DIAGNOSIS — E669 Obesity, unspecified: Secondary | ICD-10-CM | POA: Diagnosis not present

## 2024-12-03 DIAGNOSIS — S8002XA Contusion of left knee, initial encounter: Secondary | ICD-10-CM

## 2024-12-03 DIAGNOSIS — Z79890 Hormone replacement therapy (postmenopausal): Secondary | ICD-10-CM | POA: Insufficient documentation

## 2024-12-03 DIAGNOSIS — K219 Gastro-esophageal reflux disease without esophagitis: Secondary | ICD-10-CM | POA: Diagnosis not present

## 2024-12-03 DIAGNOSIS — R9431 Abnormal electrocardiogram [ECG] [EKG]: Secondary | ICD-10-CM | POA: Insufficient documentation

## 2024-12-03 DIAGNOSIS — E039 Hypothyroidism, unspecified: Secondary | ICD-10-CM | POA: Diagnosis present

## 2024-12-03 DIAGNOSIS — M51369 Other intervertebral disc degeneration, lumbar region without mention of lumbar back pain or lower extremity pain: Secondary | ICD-10-CM | POA: Insufficient documentation

## 2024-12-03 DIAGNOSIS — E785 Hyperlipidemia, unspecified: Secondary | ICD-10-CM | POA: Insufficient documentation

## 2024-12-03 DIAGNOSIS — E1140 Type 2 diabetes mellitus with diabetic neuropathy, unspecified: Secondary | ICD-10-CM | POA: Insufficient documentation

## 2024-12-03 DIAGNOSIS — R7989 Other specified abnormal findings of blood chemistry: Secondary | ICD-10-CM | POA: Insufficient documentation

## 2024-12-03 DIAGNOSIS — R296 Repeated falls: Secondary | ICD-10-CM | POA: Insufficient documentation

## 2024-12-03 DIAGNOSIS — R9082 White matter disease, unspecified: Secondary | ICD-10-CM | POA: Insufficient documentation

## 2024-12-03 DIAGNOSIS — S8001XA Contusion of right knee, initial encounter: Secondary | ICD-10-CM | POA: Diagnosis not present

## 2024-12-03 DIAGNOSIS — S0990XA Unspecified injury of head, initial encounter: Secondary | ICD-10-CM | POA: Diagnosis not present

## 2024-12-03 DIAGNOSIS — Z79899 Other long term (current) drug therapy: Secondary | ICD-10-CM | POA: Diagnosis not present

## 2024-12-03 DIAGNOSIS — S00212A Abrasion of left eyelid and periocular area, initial encounter: Secondary | ICD-10-CM | POA: Diagnosis present

## 2024-12-03 DIAGNOSIS — F411 Generalized anxiety disorder: Secondary | ICD-10-CM | POA: Diagnosis not present

## 2024-12-03 DIAGNOSIS — R0602 Shortness of breath: Secondary | ICD-10-CM | POA: Diagnosis present

## 2024-12-03 DIAGNOSIS — G3189 Other specified degenerative diseases of nervous system: Secondary | ICD-10-CM | POA: Insufficient documentation

## 2024-12-03 DIAGNOSIS — N3289 Other specified disorders of bladder: Secondary | ICD-10-CM | POA: Insufficient documentation

## 2024-12-03 DIAGNOSIS — E1165 Type 2 diabetes mellitus with hyperglycemia: Secondary | ICD-10-CM | POA: Diagnosis not present

## 2024-12-03 DIAGNOSIS — G4733 Obstructive sleep apnea (adult) (pediatric): Secondary | ICD-10-CM | POA: Diagnosis not present

## 2024-12-03 DIAGNOSIS — W19XXXA Unspecified fall, initial encounter: Secondary | ICD-10-CM | POA: Diagnosis not present

## 2024-12-03 DIAGNOSIS — Z794 Long term (current) use of insulin: Secondary | ICD-10-CM | POA: Diagnosis not present

## 2024-12-03 DIAGNOSIS — Y92009 Unspecified place in unspecified non-institutional (private) residence as the place of occurrence of the external cause: Secondary | ICD-10-CM | POA: Diagnosis not present

## 2024-12-03 DIAGNOSIS — Z6837 Body mass index (BMI) 37.0-37.9, adult: Secondary | ICD-10-CM | POA: Insufficient documentation

## 2024-12-03 DIAGNOSIS — Z7982 Long term (current) use of aspirin: Secondary | ICD-10-CM | POA: Insufficient documentation

## 2024-12-03 DIAGNOSIS — R339 Retention of urine, unspecified: Secondary | ICD-10-CM | POA: Insufficient documentation

## 2024-12-03 DIAGNOSIS — I10 Essential (primary) hypertension: Secondary | ICD-10-CM | POA: Diagnosis present

## 2024-12-03 DIAGNOSIS — E119 Type 2 diabetes mellitus without complications: Secondary | ICD-10-CM

## 2024-12-03 DIAGNOSIS — R079 Chest pain, unspecified: Secondary | ICD-10-CM | POA: Diagnosis present

## 2024-12-03 LAB — COMPREHENSIVE METABOLIC PANEL WITH GFR
ALT: 21 U/L (ref 0–44)
AST: 43 U/L — ABNORMAL HIGH (ref 15–41)
Albumin: 4.4 g/dL (ref 3.5–5.0)
Alkaline Phosphatase: 83 U/L (ref 38–126)
Anion gap: 14 (ref 5–15)
BUN: 15 mg/dL (ref 8–23)
CO2: 20 mmol/L — ABNORMAL LOW (ref 22–32)
Calcium: 10.1 mg/dL (ref 8.9–10.3)
Chloride: 105 mmol/L (ref 98–111)
Creatinine, Ser: 0.76 mg/dL (ref 0.44–1.00)
GFR, Estimated: >60 mL/min
Glucose, Bld: 155 mg/dL — ABNORMAL HIGH (ref 70–99)
Potassium: 5 mmol/L (ref 3.5–5.1)
Sodium: 139 mmol/L (ref 135–145)
Total Bilirubin: 0.4 mg/dL (ref 0.0–1.2)
Total Protein: 6.7 g/dL (ref 6.5–8.1)

## 2024-12-03 LAB — URINALYSIS, ROUTINE W REFLEX MICROSCOPIC
Bilirubin Urine: NEGATIVE
Glucose, UA: NEGATIVE mg/dL
Hgb urine dipstick: NEGATIVE
Ketones, ur: NEGATIVE mg/dL
Leukocytes,Ua: NEGATIVE
Nitrite: NEGATIVE
Protein, ur: NEGATIVE mg/dL
Specific Gravity, Urine: 1.039 — ABNORMAL HIGH (ref 1.005–1.030)
pH: 8 (ref 5.0–8.0)

## 2024-12-03 LAB — I-STAT CG4 LACTIC ACID, ED
Lactic Acid, Venous: 2.9 mmol/L (ref 0.5–1.9)
Lactic Acid, Venous: 3.4 mmol/L (ref 0.5–1.9)
Lactic Acid, Venous: 0.9 mmol/L (ref 0.5–1.9)

## 2024-12-03 LAB — I-STAT CHEM 8, ED
BUN: 15 mg/dL (ref 8–23)
Calcium, Ion: 1.18 mmol/L (ref 1.15–1.40)
Chloride: 107 mmol/L (ref 98–111)
Creatinine, Ser: 0.8 mg/dL (ref 0.44–1.00)
Glucose, Bld: 128 mg/dL — ABNORMAL HIGH (ref 70–99)
HCT: 39 % (ref 36.0–46.0)
Hemoglobin: 13.3 g/dL (ref 12.0–15.0)
Potassium: 4.8 mmol/L (ref 3.5–5.1)
Sodium: 140 mmol/L (ref 135–145)
TCO2: 20 mmol/L — ABNORMAL LOW (ref 22–32)

## 2024-12-03 LAB — PROTIME-INR
INR: 1.1 (ref 0.8–1.2)
Prothrombin Time: 14.7 s (ref 11.4–15.2)

## 2024-12-03 LAB — CK: Total CK: 93 U/L (ref 38–234)

## 2024-12-03 LAB — HEMOGLOBIN A1C
Hgb A1c MFr Bld: 7 % — ABNORMAL HIGH (ref 4.8–5.6)
Mean Plasma Glucose: 154.2 mg/dL

## 2024-12-03 LAB — CBC
HCT: 39 % (ref 36.0–46.0)
Hemoglobin: 12.6 g/dL (ref 12.0–15.0)
MCH: 26.9 pg (ref 26.0–34.0)
MCHC: 32.3 g/dL (ref 30.0–36.0)
MCV: 83.2 fL (ref 80.0–100.0)
Platelets: 356 K/uL (ref 150–400)
RBC: 4.69 MIL/uL (ref 3.87–5.11)
RDW: 15.3 % (ref 11.5–15.5)
WBC: 10.2 K/uL (ref 4.0–10.5)
nRBC: 0 % (ref 0.0–0.2)

## 2024-12-03 LAB — SAMPLE TO BLOOD BANK

## 2024-12-03 LAB — GLUCOSE, CAPILLARY: Glucose-Capillary: 126 mg/dL — ABNORMAL HIGH (ref 70–99)

## 2024-12-03 MED ORDER — MORPHINE SULFATE (PF) 2 MG/ML IV SOLN: 2.0000 mg | INTRAVENOUS | Status: DC | PRN

## 2024-12-03 MED ORDER — HYDROMORPHONE HCL 1 MG/ML IJ SOLN
1.0000 mg | Freq: Once | INTRAMUSCULAR | Status: AC
  Administered 2024-12-03: 1 mg via INTRAVENOUS
  Filled 2024-12-03: qty 1

## 2024-12-03 MED ORDER — SODIUM CHLORIDE 0.9 % IV BOLUS
1000.0000 mL | Freq: Once | INTRAVENOUS | Status: AC
  Administered 2024-12-03: 1000 mL via INTRAVENOUS

## 2024-12-03 MED ORDER — INSULIN ASPART 100 UNIT/ML IJ SOLN
0.0000 [IU] | Freq: Three times a day (TID) | INTRAMUSCULAR | Status: DC
  Administered 2024-12-03: 2 [IU] via SUBCUTANEOUS
  Administered 2024-12-04 – 2024-12-07 (×8): 3 [IU] via SUBCUTANEOUS
  Filled 2024-12-03 (×2): qty 3
  Filled 2024-12-03: qty 2
  Filled 2024-12-03: qty 3

## 2024-12-03 MED ORDER — ATORVASTATIN CALCIUM 80 MG PO TABS
80.0000 mg | ORAL_TABLET | Freq: Every day | ORAL | Status: DC
  Administered 2024-12-03 – 2024-12-07 (×5): 80 mg via ORAL
  Filled 2024-12-03 (×3): qty 1

## 2024-12-03 MED ORDER — LEVOTHYROXINE SODIUM 50 MCG PO TABS
50.0000 ug | ORAL_TABLET | Freq: Every day | ORAL | Status: DC
  Administered 2024-12-04 – 2024-12-07 (×4): 50 ug via ORAL
  Filled 2024-12-03 (×2): qty 1

## 2024-12-03 MED ORDER — IOHEXOL 350 MG/ML SOLN
75.0000 mL | Freq: Once | INTRAVENOUS | Status: AC | PRN
  Administered 2024-12-03: 75 mL via INTRAVENOUS

## 2024-12-03 MED ORDER — METOPROLOL SUCCINATE ER 25 MG PO TB24: 25.0000 mg | ORAL_TABLET | Freq: Every day | ORAL | Status: DC

## 2024-12-03 MED ORDER — SODIUM CHLORIDE 0.9% FLUSH
3.0000 mL | Freq: Two times a day (BID) | INTRAVENOUS | Status: DC
  Administered 2024-12-03 – 2024-12-07 (×7): 3 mL via INTRAVENOUS

## 2024-12-03 MED ORDER — GABAPENTIN 100 MG PO CAPS
100.0000 mg | ORAL_CAPSULE | Freq: Three times a day (TID) | ORAL | Status: DC
  Administered 2024-12-03 – 2024-12-07 (×11): 100 mg via ORAL
  Filled 2024-12-03 (×7): qty 1

## 2024-12-03 MED ORDER — ACETAMINOPHEN 325 MG PO TABS
650.0000 mg | ORAL_TABLET | Freq: Four times a day (QID) | ORAL | Status: DC | PRN
  Administered 2024-12-03 – 2024-12-07 (×5): 650 mg via ORAL
  Filled 2024-12-03 (×2): qty 2

## 2024-12-03 MED ORDER — SODIUM CHLORIDE 0.9 % IV SOLN: INTRAVENOUS | Status: AC

## 2024-12-03 MED ORDER — PANTOPRAZOLE SODIUM 40 MG PO TBEC
40.0000 mg | DELAYED_RELEASE_TABLET | Freq: Every day | ORAL | Status: DC
  Administered 2024-12-03 – 2024-12-07 (×5): 40 mg via ORAL
  Filled 2024-12-03 (×3): qty 1

## 2024-12-03 MED ORDER — ACETAMINOPHEN 650 MG RE SUPP: 650.0000 mg | Freq: Four times a day (QID) | RECTAL | Status: DC | PRN

## 2024-12-03 MED ORDER — HYDROCODONE-ACETAMINOPHEN 5-325 MG PO TABS
1.0000 | ORAL_TABLET | ORAL | Status: DC | PRN
  Administered 2024-12-03: 1 via ORAL
  Filled 2024-12-03: qty 1

## 2024-12-03 MED ORDER — CITALOPRAM HYDROBROMIDE 20 MG PO TABS
40.0000 mg | ORAL_TABLET | Freq: Every day | ORAL | Status: DC
  Administered 2024-12-03 – 2024-12-04 (×2): 40 mg via ORAL
  Filled 2024-12-03 (×2): qty 2

## 2024-12-03 MED ORDER — HYDROMORPHONE HCL 1 MG/ML IJ SOLN
1.0000 mg | Freq: Four times a day (QID) | INTRAMUSCULAR | Status: AC | PRN
  Administered 2024-12-03 – 2024-12-04 (×3): 1 mg via INTRAVENOUS
  Filled 2024-12-03 (×3): qty 1

## 2024-12-03 MED ORDER — METOPROLOL SUCCINATE ER 25 MG PO TB24
25.0000 mg | ORAL_TABLET | Freq: Every day | ORAL | Status: DC
  Administered 2024-12-03 – 2024-12-07 (×5): 25 mg via ORAL
  Filled 2024-12-03 (×3): qty 1

## 2024-12-03 MED ORDER — HYDRALAZINE HCL 20 MG/ML IJ SOLN
10.0000 mg | INTRAMUSCULAR | Status: DC | PRN
  Administered 2024-12-03 – 2024-12-07 (×4): 10 mg via INTRAVENOUS
  Filled 2024-12-03 (×3): qty 1

## 2024-12-03 MED ORDER — TRAZODONE HCL 50 MG PO TABS
50.0000 mg | ORAL_TABLET | Freq: Every day | ORAL | Status: DC
  Administered 2024-12-03 – 2024-12-06 (×4): 50 mg via ORAL
  Filled 2024-12-03 (×3): qty 1

## 2024-12-03 MED ORDER — ASPIRIN 81 MG PO TBEC
81.0000 mg | DELAYED_RELEASE_TABLET | Freq: Every day | ORAL | Status: DC
  Administered 2024-12-03 – 2024-12-07 (×5): 81 mg via ORAL
  Filled 2024-12-03 (×3): qty 1

## 2024-12-03 NOTE — ED Triage Notes (Signed)
 Pt BIBA from home, after fall due to ongoing balance issues. No LOC and not on blood thinners. Injuries - Left side head injury., Left hip and lower back pain.  Abrasions - above left eye and bilateral knees. Pt hyperventilating on arrival. She endorses hx of anxiety.

## 2024-12-03 NOTE — H&P (Signed)
 " History and Physical    Patient: Alexandra Wolfe FMW:[id number] DOB: September 06, 1946 DOA: 12/03/2024 DOS: the patient was seen and examined on 12/03/2024 . PCP: Pcp, No  Patient coming from: Home Chief complaint: Chief Complaint  Patient presents with   Fall   HPI:  Alexandra Wolfe is a 78 y.o. female with past medical history  of  T2DM with neuropathy, morbid obesity, asthma, urinary incontinence, SDH/TBI/neurocognitive deficit, osteoarthritis, lumbar laminectomy, hypothyroidism, anxiety, depression and GERD presenting with recurrent falls, dysuria and suprapubic pain coming in for falls and loss of balance this is also chronic , pt does live at home and is independent. PT sustained injuries to her head and left hip and lower back. And abrasion to left eye and BL knees.   ED Course:  Vital signs in the ED were notable for the following:  Vitals:   12/03/24 1406 12/03/24 1536 12/03/24 1600 12/03/24 1743  BP: (!) 183/71 (!) 177/81 (!) 172/70 137/60  Pulse: 80  94 91  Temp:  97.6 F (36.4 C)    Resp: (!) 25 (!) 22  18  Height:      Weight:      SpO2: 97% 99% 98% 96%  TempSrc:  Oral    BMI (Calculated):       >>ED evaluation thus far shows: -EKG shows sinus rhythm 61 QTc prolonged at 505, PR interval 161. - CMP shows bicarb of 20 glucose 155 normal kidney function AST of 43 lactic acid of 3.4 repeat lactic of 2.9. - CBC is within normal limits. - Blood cultures collected in the ED and are pending. - Head CT negative for any traumatic injury, mild chronic white matter disease, stable chronic cervical spine degeneration but no acute traumatic injuries identified, CT of the abdomen negative for any acute traumatic injury in the chest and abdomen and pelvis distended urinary bladder query urinary retention.  >>While in the ED patient received the following: Medications  HYDROmorphone  (DILAUDID ) injection 1 mg (1 mg Intravenous Given 12/03/24 1131)  sodium chloride  0.9 % bolus 1,000 mL  (1,000 mLs Intravenous New Bag/Given 12/03/24 1233)  iohexol  (OMNIPAQUE ) 350 MG/ML injection 75 mL (75 mLs Intravenous Contrast Given 12/03/24 1227)  HYDROmorphone  (DILAUDID ) injection 1 mg (1 mg Intravenous Given 12/03/24 1236)   Review of Systems  Musculoskeletal:  Positive for falls and joint pain.   Past Medical History:  Diagnosis Date   ALLERGIC RHINITIS 04/02/2009   no per pt   Anxiety    BACK PAIN 09/27/2008   BUNIONS, BILATERAL 12/23/2007   CHEST DISCOMFORT, ATYPICAL 11/07/2009   Chronic LBP    COLONIC POLYPS, HX OF 09/13/2007   CONSTIPATION 09/27/2008   DEPRESSION 09/13/2007   Diabetes mellitus    diet controlled   DYSPNEA ON EXERTION 02/06/2010   Eustachian tube dysfunction 05/20/2011   FOOT PAIN, RIGHT 09/27/2008   HYPERLIPIDEMIA 03/11/2008   LBP (low back pain) 05/20/2011   Left otitis media 08/07/2022   Leukocytosis 11/20/2011   OSTEOARTHROSIS NOS, LOWER LEG 09/13/2007   SHOULDER PAIN, LEFT 02/14/2009   SVT (supraventricular tachycardia)    SYMPTOM, PALPITATIONS 09/13/2007   Traumatic brain injury (HCC) 03/21/2021   URINARY INCONTINENCE 08/23/2009   Vertigo 05/20/2011   Past Surgical History:  Procedure Laterality Date   BUNIONECTOMY     right   ccx     CHOLECYSTECTOMY     LUMBAR LAMINECTOMY     LUMBAR LAMINECTOMY     SHOULDER ARTHROSCOPY  12/13/2011   Procedure: ARTHROSCOPY SHOULDER;  Surgeon: Elspeth JONELLE Her;  Location: [facility] OR;  Service: Orthopedics;  Laterality: Left;  Left Shoulder ArthroscopyDebridement Limited Tenodesis Open Rotator Cuff Repair Spur Removal Right Shoulder Injection     reports that she has quit smoking. Her smoking use included cigarettes. She has a 10 pack-year smoking history. She has never used smokeless tobacco. She reports that she does not drink alcohol and does not use drugs. Allergies[1] Family History  Problem Relation Age of Onset   Colon cancer Mother    Heart disease Father    Diabetes type II Father    Heart disease  Brother    Diabetes Other        father   Esophageal cancer Neg Hx    Stomach cancer Neg Hx    Rectal cancer Neg Hx    Prior to Admission medications  Medication Sig Start Date End Date Taking? Authorizing Provider  acetaminophen  (TYLENOL ) 325 MG tablet Take 2 tablets (650 mg total) by mouth every 6 (six) hours as needed for mild pain or headache. 03/02/20   Vann, Jessica U, DO  albuterol  (VENTOLIN  HFA) 108 (90 Base) MCG/ACT inhaler INHALE 2 PUFFS INTO THE LUNGS EVERY 6 HOURS AS NEEDED FOR WHEEZING OR SHORTNESS OF BREATH Patient not taking: Reported on 11/18/2024 09/19/20   Norleen Lynwood ORN, MD  aspirin  81 MG EC tablet Take 81 mg by mouth daily.    [provider]  atorvastatin  (LIPITOR ) 80 MG tablet TAKE 1 TABLET DAILY 12/25/23   Norleen Lynwood ORN, MD  budesonide -formoterol  (SYMBICORT ) 160-4.5 MCG/ACT inhaler Inhale 2 puffs into the lungs 2 (two) times daily. 02/06/24   Norleen Lynwood ORN, MD  Calcium  Carbonate (CALCIUM  500 PO) Take 1,000 mg by mouth 2 (two) times daily.  Patient not taking: Reported on 11/18/2024    [provider]  cephALEXin  (KEFLEX ) 500 MG capsule TAKE 1 CAPSULE BY MOUTH ON MONDAY, WEDNESDAY AND FRIDAY 08/30/24   Norleen Lynwood ORN, MD  cetirizine  (ZYRTEC ) 10 MG tablet TAKE 1 TABLET DAILY Patient not taking: Reported on 11/18/2024 12/17/23   Norleen Lynwood ORN, MD  citalopram  (CELEXA ) 40 MG tablet Take 1 tablet (40 mg total) by mouth daily. 06/18/24   Norleen Lynwood ORN, MD  diclofenac  Sodium (VOLTAREN ) 1 % GEL Apply 4 g topically 4 (four) times daily. Apply topically to affected area qid 01/09/24   Corey, Evan S, MD  diphenoxylate -atropine  (LOMOTIL ) 2.5-0.025 MG tablet Take 1 tablet by mouth 4 (four) times daily as needed for diarrhea or loose stools. 08/20/23   Norleen Lynwood ORN, MD  ergocalciferol  (VITAMIN D2) 1.25 MG (50000 UT) capsule Take 1 tablet by mouth daily.    [provider]  gabapentin  (NEURONTIN ) 100 MG capsule TAKE 1 CAPSULE BY MOUTH THREE TIMES DAILY 11/12/24   Norleen Lynwood ORN, MD  GEMTESA 75 MG TABS Take 1 tablet by mouth daily. Patient not taking: Reported on 11/18/2024 01/05/24   [provider]  levothyroxine  (SYNTHROID ) 50 MCG tablet TAKE 1 TABLET(50 MCG) BY MOUTH DAILY. NEEDS APPOINTMENT 12/25/23   Norleen Lynwood ORN, MD  metFORMIN  (GLUCOPHAGE ) 500 MG tablet Take 1 tablet (500 mg total) by mouth 2 (two) times daily with a meal. 02/06/24   Norleen Lynwood ORN, MD  metoprolol  succinate (TOPROL -XL) 25 MG 24 hr tablet TAKE 1 TABLET BY MOUTH EVERY DAY 12/17/23   Norleen Lynwood ORN, MD  Multiple Vitamin (MULTIVITAMIN WITH MINERALS) TABS tablet Take 1 tablet by mouth daily. 03/02/20   Vann, Jessica U, DO  pantoprazole  (PROTONIX ) 40  MG tablet TAKE 1 TABLET BY MOUTH EVERY DAY 10/30/23   Norleen Lynwood ORN, MD  polyethylene glycol powder (GLYCOLAX /MIRALAX ) 17 GM/SCOOP powder Take 17 g by mouth 2 (two) times daily as needed. Patient not taking: Reported on 11/18/2024 12/14/21   Norleen Lynwood ORN, MD  Semaglutide , 1 MG/DOSE, 4 MG/3ML SOPN Inject 1 mg as directed once a week. Patient not taking: Reported on 11/18/2024 03/08/24   Norleen Lynwood ORN, MD  traZODone  (DESYREL ) 50 MG tablet TAKE 1 TABLET BY MOUTH EVERYDAY AT BEDTIME 10/30/23   Norleen Lynwood ORN, MD  trospium  (SANCTURA ) 20 MG tablet Take 1 tablet (20 mg total) by mouth at bedtime. 02/06/24   Norleen Lynwood ORN, MD                                                                                 Vitals:   12/03/24 1406 12/03/24 1536 12/03/24 1600 12/03/24 1743  BP: (!) 183/71 (!) 177/81 (!) 172/70 137/60  Pulse: 80  94 91  Resp: (!) 25 (!) 22  18  Temp:  97.6 F (36.4 C)    TempSrc:  Oral    SpO2: 97% 99% 98% 96%  Weight:      Height:       Physical Exam Vitals reviewed.  Constitutional:      General: She is not in acute distress.    Appearance: She is not ill-appearing.  HENT:     Head: Normocephalic.      Comments: Small abrasion at the outer corner of the left eye. Eyes:     Extraocular Movements: Extraocular movements intact.   Cardiovascular:     Rate and Rhythm: Normal rate and regular rhythm.     Pulses: Normal pulses.     Heart sounds: Normal heart sounds.  Pulmonary:     Effort: Pulmonary effort is normal.     Breath sounds: Normal breath sounds.  Abdominal:     General: There is no distension.     Palpations: Abdomen is soft.     Tenderness: There is no abdominal tenderness.      Comments: Large bruise  Musculoskeletal:     Right lower leg: No edema.     Left lower leg: No edema.  Skin:    Findings: Bruising present.  Neurological:     General: No focal deficit present.     Mental Status: She is alert and oriented to person, place, and time.     Labs on Admission: I have personally reviewed following labs and imaging studies CBC: Recent Labs  Lab 12/03/24 1130 12/03/24 1145  WBC 10.2  --   HGB 12.6 13.3  HCT 39.0 39.0  MCV 83.2  --   PLT 356  --    Basic Metabolic Panel: Recent Labs  Lab 12/03/24 1130 12/03/24 1145  NA 139 140  K 5.0 4.8  CL 105 107  CO2 20*  --   GLUCOSE 155* 128*  BUN 15 15  CREATININE 0.76 0.80  CALCIUM  10.1  --    GFR: Estimated Creatinine Clearance: 68 mL/min (by C-G formula based on SCr of 0.8 mg/dL). Liver Function Tests: Recent Labs  Lab 12/03/24 1130  AST  43*  ALT 21  ALKPHOS 83  BILITOT 0.4  PROT 6.7  ALBUMIN 4.4   No results for input(s): LIPASE, AMYLASE in the last 168 hours. No results for input(s): AMMONIA in the last 168 hours. Recent Labs    02/19/24 1058 02/19/24 2254 02/20/24 0726 02/21/24 0623 02/23/24 0608 02/24/24 0542 03/31/24 1511 09/25/24 1334 12/03/24 1130 12/03/24 1145  BUN 20  --  12 12 14 16 20 17 15 15   CREATININE 0.81 0.82 0.77 0.76 0.71 0.74 0.78 1.14* 0.76 0.80    Cardiac Enzymes: Recent Labs  Lab 12/03/24 1130  CKTOTAL 93   BNP (last 3 results) No results for input(s): PROBNP in the last 8760 hours. HbA1C: Recent Labs    12/03/24 1130  HGBA1C 7.0*   CBG: Recent Labs  Lab  12/03/24 1555  GLUCAP 126*   Lipid Profile: No results for input(s): CHOL, HDL, LDLCALC, TRIG, CHOLHDL, LDLDIRECT in the last 72 hours. Thyroid  Function Tests: No results for input(s): TSH, T4TOTAL, FREET4, T3FREE, THYROIDAB in the last 72 hours. Anemia Panel: No results for input(s): VITAMINB12, FOLATE, FERRITIN, TIBC, IRON, RETICCTPCT in the last 72 hours. Urine analysis:    Component Value Date/Time   COLORURINE STRAW (A) 12/03/2024 1428   APPEARANCEUR CLEAR 12/03/2024 1428   LABSPEC 1.039 (H) 12/03/2024 1428   PHURINE 8.0 12/03/2024 1428   GLUCOSEU NEGATIVE 12/03/2024 1428   GLUCOSEU NEGATIVE 03/31/2024 1511   HGBUR NEGATIVE 12/03/2024 1428   BILIRUBINUR NEGATIVE 12/03/2024 1428   KETONESUR NEGATIVE 12/03/2024 1428   PROTEINUR NEGATIVE 12/03/2024 1428   UROBILINOGEN 0.2 03/31/2024 1511   NITRITE NEGATIVE 12/03/2024 1428   LEUKOCYTESUR NEGATIVE 12/03/2024 1428   Radiological Exams on Admission: DG Knee Complete 4 Views Right Result Date: 12/03/2024 CLINICAL DATA:  Bilateral knee injuries after fall. EXAM: RIGHT KNEE - COMPLETE 4+ VIEW COMPARISON:  None Available. FINDINGS: No evidence of fracture, dislocation, or joint effusion. Moderate narrowing of lateral joint space is noted with osteophyte formation. Chondrocalcinosis is noted medially. Soft tissues are unremarkable. 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 No acute abnormality seen. Electronically Signed   By: Lynwood Landy Raddle M.D.   On: 12/03/2024 14:04   DG Knee Complete 4 Views Left Result Date: 12/03/2024 CLINICAL DATA:  Bilateral knee abrasions after fall EXAM: LEFT KNEE - COMPLETE 4+ VIEW COMPARISON:  None Available. FINDINGS: No evidence of fracture, dislocation, or joint effusion. Moderate to severe narrowing of lateral joint space is noted with osteophyte formation. Chondrocalcinosis is noted medially. Soft tissues are unremarkable. IMPRESSION: Moderate to  severe degenerative joint disease is noted laterally. No acute abnormality seen. Electronically Signed   By: Lynwood Landy Raddle M.D.   On: 12/03/2024 14:03   CT CHEST ABDOMEN PELVIS W CONTRAST Result Date: 12/03/2024 EXAM: CT CHEST, ABDOMEN AND PELVIS WITH CONTRAST 12/03/2024 12:28:00 PM TECHNIQUE: CT of the chest, abdomen and pelvis was performed with the administration of 75 mL of iohexol  (OMNIPAQUE ) 350 MG/ML injection. Multiplanar reformatted images are provided for review. Automated exposure control, iterative reconstruction, and/or weight based adjustment of the mA/kV was utilized to reduce the radiation dose to as low as reasonably achievable. COMPARISON: CT chest, abdomen, and pelvis 09/25/2024. CLINICAL HISTORY: 78 year old female with polytrauma, blunt, fall, left side injury. FINDINGS: CHEST: MEDIASTINUM AND LYMPH NODES: Heart and pericardium are unremarkable. Normal heart size. No pericardial effusion. The central airways are clear. No mediastinal, hilar or axillary lymphadenopathy. LUNGS AND PLEURA: Lung volumes and major airways remain normal. No focal consolidation or  pulmonary edema. No pleural effusion or pneumothorax. ABDOMEN AND PELVIS: LIVER: Stable liver enhancement. GALLBLADDER AND BILE DUCTS: Status post cholecystectomy. No biliary ductal dilatation. SPLEEN: No acute abnormality. PANCREAS: No acute abnormality. ADRENAL GLANDS: No acute abnormality. KIDNEYS, URETERS AND BLADDER: Symmetric and normal renal contrast excretion. Diminutive ureters. No stones in the kidneys or ureters. No hydronephrosis. No perinephric or periureteral stranding. Distended urinary bladder, estimated volume 601 mL. GI AND BOWEL: Stomach is decompressed. Redundant large bowel. Mild retained stool. Nondilated bowel loops. There is no bowel obstruction. REPRODUCTIVE ORGANS: No acute abnormality. PERITONEUM AND RETROPERITONEUM: Stable small fat containing umbilical hernia. No ascites. No free air. VASCULATURE: Calcified  coronary artery and aortic atherosclerosis. Aorta is normal in caliber. Major arterial structures and portal venous system appear to be patent. ABDOMINAL AND PELVIS LYMPH NODES: No lymphadenopathy. BONES AND SOFT TISSUES: Normal thoracic and lumbar segmentation. Widespread advanced spinal degeneration appears stable since 09/25/2024. 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 Chronic severe bilateral glenohumeral degeneration. No acute osseous abnormality. Stable mild breast tissue asymmetry. No superficial soft tissue injury identified. IMPRESSION: 1. No acute traumatic injury identified in the chest, abdomen, and pelvis. 2. Distended urinary bladder (estimated 601 mL). Query urinary retention. 3. Advanced chronic spinal degeneration. Electronically signed by: Helayne Hurst MD 12/03/2024 12:46 PM EST RP Workstation: [id number]   CT CERVICAL SPINE WO CONTRAST Result Date: 12/03/2024 EXAM: CT CERVICAL SPINE WITHOUT CONTRAST 12/03/2024 12:28:00 PM TECHNIQUE: CT of the cervical spine was performed without the administration of intravenous contrast. Multiplanar reformatted images are provided for review. Automated exposure control, iterative reconstruction, and/or weight based adjustment of the mA/kV was utilized to reduce the radiation dose to as low as reasonably achievable. COMPARISON: Cervical spine CT 09/25/2024. CLINICAL HISTORY: 78 year old female. Polytrauma, blunt. Fall, left side injury. FINDINGS: BONES AND ALIGNMENT: Mildly improved cervical lordosis. No acute fracture or traumatic malalignment. DEGENERATIVE CHANGES: Chronic C1-C2 degeneration. Advanced chronic cervical facet arthropathy on the right including vacuum facet at C2-C3 through C4-C5. There does appear to be chronic degenerative facet ankylosis at the cervicothoracic junction. No other convincing facet ankylosis. Chronic severe lower cervical disc and endplate degeneration at C5-C6 and C6-C7,  with evidence of developing interbody ankylosis at the latter. Chronic cervical spine degeneration appears stable by CT. SOFT TISSUES: No prevertebral soft tissue swelling. Negative visible non-contrast thoracic inlet. Calcified left ICA origin atherosclerosis. Otherwise negative visible non-contrast neck soft tissues. IMPRESSION: 1. No acute traumatic injury identified in the cervical spine. 2. Stable chronic cervical spine degeneration. Electronically signed by: Helayne Hurst MD 12/03/2024 12:37 PM EST RP Workstation: [id number]   CT HEAD WO CONTRAST Result Date: 12/03/2024 EXAM: CT HEAD WITHOUT CONTRAST 12/03/2024 12:28:00 PM TECHNIQUE: CT of the head was performed without the administration of intravenous contrast. Automated exposure control, iterative reconstruction, and/or weight based adjustment of the mA/kV was utilized to reduce the radiation dose to as low as reasonably achievable. COMPARISON: Brain MRI 05/09/2020, Head CT 09/25/2024. CLINICAL HISTORY: 78 year old female. History of moderate-severe head trauma, fall, and left side injury. FINDINGS: BRAIN AND VENTRICLES: No acute hemorrhage. No evidence of acute infarct. No hydrocephalus. No extra-axial collection. No mass effect or midline shift. Brain volume stable, within normal limits for age. Basal ganglia vascular calcifications incidentally noted and stable. Stable gray white differentiation. Mild for age periventricular white matter hypodensity. No suspicious intracranial vascular hyperdensity. ORBITS: No acute abnormality. SINUSES: Paranasal sinuses, tympanic cavities and mastoids are well aerated. SOFT TISSUES AND SKULL: No  acute soft tissue abnormality. No skull fracture. Hyperostosis of the calvarium, normal variant. Calcified atherosclerosis at the skull base. IMPRESSION: 1. No acute traumatic injury identified. 2. Stable mild for age chronic white matter disease. Electronically signed by: Helayne Hurst MD 12/03/2024 12:34 PM EST RP  Workstation: [id number]   Data Reviewed: Relevant notes from primary care and specialist visits, past discharge summaries as available in EHR, including Care Everywhere . Prior diagnostic testing as pertinent to current admission diagnoses, Updated medications and problem lists for reconciliation .ED course, including vitals, labs, imaging, treatment and response to treatment,Triage notes, nursing and pharmacy notes and ED provider's notes.Notable results as noted in HPI.Discussed case with EDMD/ ED APP/ or Specialty MD on call and as needed.  Assessment & Plan  >>Falls: Pt states she is suppose to get knee sx done but is told she is not good candidate and keeps falling because of her knees. Pt has also had SDH in past from her falls. Will order PT consult for safety and plan for mobility. Ortho consult on outpatient basis.  Fall and aspiration precaution.  >> Lactic acidosis: Suspect may be due to metformin  less likely sepsis will follow cultures, Urinalysis is still pending.gentle IVF hydration.   >>Hypothyroidism: Continue levothyroxine  at 50 mcg.   >>Essential HTN: Vitals:   12/03/24 1100 12/03/24 1406 12/03/24 1536 12/03/24 1600  BP: (!) 166/80 (!) 183/71 (!) 177/81 (!) 172/70   12/03/24 1743  BP: 137/60  Continue metoprolol . PRN hydralazine .   >>DM II: Glycemic protocol and currently hold patient's metformin .   >> Urinary Retention: Urinalysis pending, will follow and initiate antibiotics as deemed appropriate.    >> GERD: Aspiration precaution and IV PPI.   >>GAD: Continue citalopram .  >> OSA: Cpap per home settings.    DVT prophylaxis:  SCD'S Consults:  None.   Advance Care Planning:    Code Status: Full Code   Family Communication:  None.  Disposition Plan:  TBD Severity of Illness: The appropriate patient status for this patient is OBSERVATION. Observation status is judged to be reasonable and necessary in order to provide the required intensity of  service to ensure the patient's safety. The patient's presenting symptoms, physical exam findings, and initial radiographic and laboratory data in the context of their medical condition is felt to place them at decreased risk for further clinical deterioration. Furthermore, it is anticipated that the patient will be medically stable for discharge from the hospital within 2 midnights of admission.   Unresulted Labs (From admission, onward)     Start     Ordered   12/04/24 0500  Comprehensive metabolic panel  Tomorrow morning,   R        12/03/24 1445   12/04/24 0500  CBC  Tomorrow morning,   R        12/03/24 1445   12/03/24 1149  Culture, blood (routine x 2)  BLOOD CULTURE X 2,   R      12/03/24 1148            Meds ordered this encounter  Medications   HYDROmorphone  (DILAUDID ) injection 1 mg   sodium chloride  0.9 % bolus 1,000 mL   iohexol  (OMNIPAQUE ) 350 MG/ML injection 75 mL   HYDROmorphone  (DILAUDID ) injection 1 mg   sodium chloride  flush (NS) 0.9 % injection 3 mL   0.9 %  sodium chloride  infusion   OR Linked Order Group    acetaminophen  (TYLENOL ) tablet 650 mg    acetaminophen  (TYLENOL ) suppository 650 mg  DISCONTD: HYDROcodone -acetaminophen  (NORCO/VICODIN) 5-325 MG per tablet 1 tablet    Refill:  0   DISCONTD: morphine  (PF) 2 MG/ML injection 2 mg   atorvastatin  (LIPITOR ) tablet 80 mg   aspirin  EC tablet 81 mg   citalopram  (CELEXA ) tablet 40 mg   gabapentin  (NEURONTIN ) capsule 100 mg   levothyroxine  (SYNTHROID ) tablet 50 mcg   DISCONTD: metoprolol  succinate (TOPROL -XL) 24 hr tablet 25 mg   pantoprazole  (PROTONIX ) EC tablet 40 mg   traZODone  (DESYREL ) tablet 50 mg   insulin  aspart (novoLOG ) injection 0-15 Units    Correction coverage::   Moderate (average weight, post-op)    CBG < 70::   implement hypoglycemia protocol    CBG 70 - 120::   0 units    CBG 121 - 150::   2 units    CBG 151 - 200::   3 units    CBG 201 - 250::   5 units    CBG 251 - 300::   8 units     CBG 301 - 350::   11 units    CBG 351 - 400::   15 units    CBG > 400:   call MD and obtain STAT lab verification   hydrALAZINE  (APRESOLINE ) injection 10 mg   HYDROmorphone  (DILAUDID ) injection 1 mg   metoprolol  succinate (TOPROL -XL) 24 hr tablet 25 mg     Orders Placed This Encounter  Procedures   Culture, blood (routine x 2)   CT HEAD WO CONTRAST   CT CERVICAL SPINE WO CONTRAST   CT CHEST ABDOMEN PELVIS W CONTRAST   DG Knee Complete 4 Views Left   DG Knee Complete 4 Views Right   Comprehensive metabolic panel   CBC   Urinalysis, Routine w reflex microscopic -Urine, Clean Catch   Protime-INR   CK   Comprehensive metabolic panel   CBC   Hemoglobin A1c   Glucose, capillary   Diet Carb Modified Room service appropriate? Yes   ED Cardiac monitoring   Measure blood pressure   Initiate Carrier Fluid Protocol   In and Out Cath   Maintain IV access   Vital signs   Notify physician (specify)   Refer to Sidebar Report Mobility Protocol for Adult Inpatient   Initiate Adult Central Line Maintenance and Catheter Clearance Protocol for patients with central line (CVC, PICC, Port, Hemodialysis, Trialysis)   Daily weights   Intake and Output   Initiate CHG Protocol for patients in ICU/SD or any patient with a central line or foley catheter   Do not place and if present remove PureWick   Initiate Oral Care Protocol   Initiate Carrier Fluid Protocol   RN may order General Admission PRN Orders utilizing General Admission PRN medications (through manage orders) for the following patient needs: allergy symptoms (Claritin ), cold sores (Carmex), cough (Robitussin DM), eye irritation (Liquifilm Tears), hemorrhoids (Tucks), indigestion (Maalox), minor skin irritation (Hydrocortisone Cream), muscle pain Lucienne Gay), nose irritation (saline nasal spray) and sore throat (Chloraseptic spray).   SCDs   Cardiac Monitoring Continuous x 48 hours Indications for use: Other; Other indications for use:  Falls.   Ambulate with assistance   Apply Diabetes Mellitus Care Plan   STAT CBG when hypoglycemia is suspected. If treated, recheck every 15 minutes after each treatment until CBG >/= 70 mg/dl   Refer to Hypoglycemia Protocol Sidebar Report for treatment of CBG < 70 mg/dl   No HS correction Insulin    Full code   Consult to hospitalist   ED  Pulse oximetry, continuous   Pulse oximetry check with vital signs   Oxygen  therapy Mode or (Route): Nasal cannula; Liters Per Minute: 2; Keep O2 saturation between: greater than 92 %   CPAP   I-Stat Chem 8, ED   I-Stat Lactic Acid, ED   I-Stat Lactic Acid   EKG 12-Lead   Sample to Blood Bank   Place in observation (patient's expected length of stay will be less than 2 midnights)   Aspiration precautions   Fall precautions    Author: Mario LULLA Blanch, MD 12 pm- 8 pm. [facility]. 12/03/2024 7:19 PM Please note for any communication after hours contact [facility] Assigned provider on call on Amion.       [1]  Allergies Allergen Reactions   Aripiprazole Other (See Comments)    REACTION: agitation, patient does not recognize   Simvastatin Other (See Comments)    REACTION: myalgia,  patient does not recognize   "

## 2024-12-03 NOTE — Plan of Care (Signed)

## 2024-12-03 NOTE — ED Provider Notes (Signed)
 " [place] [facility] Provider Note   CSN: [id number] Arrival date & time: 12/03/24  1055     Patient presents with: Alexandra Wolfe is a 78 y.o. female.  She is brought in by ambulance after a fall at home this morning.  She said she struck her head and hit her left hip and both her knees.  She is having some trouble breathing.  She is in significant pain.  She said she has chronic shoulder and knee pain.  Had a fall a month ago and is still recovering from that with significant bruising on her left flank.   The history is provided by the patient and the EMS personnel.  Fall This is a recurrent problem. The problem has not changed since onset.Associated symptoms include chest pain, abdominal pain, headaches and shortness of breath. The symptoms are aggravated by bending and twisting. Nothing relieves the symptoms. She has tried nothing for the symptoms. The treatment provided no relief.       Prior to Admission medications  Medication Sig Start Date End Date Taking? Authorizing Provider  acetaminophen  (TYLENOL ) 325 MG tablet Take 2 tablets (650 mg total) by mouth every 6 (six) hours as needed for mild pain or headache. 03/02/20   Vann, Jessica U, DO  albuterol  (VENTOLIN  HFA) 108 (90 Base) MCG/ACT inhaler INHALE 2 PUFFS INTO THE LUNGS EVERY 6 HOURS AS NEEDED FOR WHEEZING OR SHORTNESS OF BREATH Patient not taking: Reported on 11/18/2024 09/19/20   Norleen Lynwood ORN, MD  aspirin  81 MG EC tablet Take 81 mg by mouth daily.    [provider]  atorvastatin  (LIPITOR ) 80 MG tablet TAKE 1 TABLET DAILY 12/25/23   Norleen Lynwood ORN, MD  budesonide -formoterol  (SYMBICORT ) 160-4.5 MCG/ACT inhaler Inhale 2 puffs into the lungs 2 (two) times daily. 02/06/24   Norleen Lynwood ORN, MD  Calcium  Carbonate (CALCIUM  500 PO) Take 1,000 mg by mouth 2 (two) times daily.  Patient not taking: Reported on 11/18/2024    [provider]  cephALEXin  (KEFLEX ) 500 MG  capsule TAKE 1 CAPSULE BY MOUTH ON MONDAY, WEDNESDAY AND FRIDAY 08/30/24   Norleen Lynwood ORN, MD  cetirizine  (ZYRTEC ) 10 MG tablet TAKE 1 TABLET DAILY Patient not taking: Reported on 11/18/2024 12/17/23   Norleen Lynwood ORN, MD  citalopram  (CELEXA ) 40 MG tablet Take 1 tablet (40 mg total) by mouth daily. 06/18/24   Norleen Lynwood ORN, MD  diclofenac  Sodium (VOLTAREN ) 1 % GEL Apply 4 g topically 4 (four) times daily. Apply topically to affected area qid 01/09/24   Corey, Evan S, MD  diphenoxylate -atropine  (LOMOTIL ) 2.5-0.025 MG tablet Take 1 tablet by mouth 4 (four) times daily as needed for diarrhea or loose stools. 08/20/23   Norleen Lynwood ORN, MD  ergocalciferol  (VITAMIN D2) 1.25 MG (50000 UT) capsule Take 1 tablet by mouth daily.    [provider]  gabapentin  (NEURONTIN ) 100 MG capsule TAKE 1 CAPSULE BY MOUTH THREE TIMES DAILY 11/12/24   Norleen Lynwood ORN, MD  GEMTESA 75 MG TABS Take 1 tablet by mouth daily. Patient not taking: Reported on 11/18/2024 01/05/24   [provider]  levothyroxine  (SYNTHROID ) 50 MCG tablet TAKE 1 TABLET(50 MCG) BY MOUTH DAILY. NEEDS APPOINTMENT 12/25/23   Norleen Lynwood ORN, MD  metFORMIN  (GLUCOPHAGE ) 500 MG tablet Take 1 tablet (500 mg total) by mouth 2 (two) times daily with a meal. 02/06/24   Norleen Lynwood ORN, MD  metoprolol  succinate (TOPROL -XL) 25 MG 24  hr tablet TAKE 1 TABLET BY MOUTH EVERY DAY 12/17/23   Norleen Lynwood ORN, MD  Multiple Vitamin (MULTIVITAMIN WITH MINERALS) TABS tablet Take 1 tablet by mouth daily. 03/02/20   Vann, Jessica U, DO  pantoprazole  (PROTONIX ) 40 MG tablet TAKE 1 TABLET BY MOUTH EVERY DAY 10/30/23   Norleen Lynwood ORN, MD  polyethylene glycol powder (GLYCOLAX /MIRALAX ) 17 GM/SCOOP powder Take 17 g by mouth 2 (two) times daily as needed. Patient not taking: Reported on 11/18/2024 12/14/21   Norleen Lynwood ORN, MD  Semaglutide , 1 MG/DOSE, 4 MG/3ML SOPN Inject 1 mg as directed once a week. Patient not taking: Reported on 11/18/2024 03/08/24   Norleen Lynwood ORN, MD  traZODone   (DESYREL ) 50 MG tablet TAKE 1 TABLET BY MOUTH EVERYDAY AT BEDTIME 10/30/23   Norleen Lynwood ORN, MD  trospium  (SANCTURA ) 20 MG tablet Take 1 tablet (20 mg total) by mouth at bedtime. 02/06/24   Norleen Lynwood ORN, MD    Allergies: Aripiprazole and Simvastatin    Review of Systems  Respiratory:  Positive for shortness of breath.   Cardiovascular:  Positive for chest pain.  Gastrointestinal:  Positive for abdominal pain.  Neurological:  Positive for headaches.    Updated Vital Signs BP (!) 166/80 (BP Location: Left Arm)   Pulse 78   Temp (!) 97.5 F (36.4 C) (Oral)   Resp (!) 35   Ht 5' 6 (1.676 m)   Wt 99.8 kg   SpO2 100%   BMI 35.51 kg/m   Physical Exam Vitals and nursing note reviewed.  Constitutional:      General: She is not in acute distress.    Appearance: Normal appearance. She is well-developed.  HENT:     Head: Normocephalic.     Comments: She has a little bit of bleeding just lateral to her left eye.  No significant facial bruising no large scalp hematomas Eyes:     Conjunctiva/sclera: Conjunctivae normal.  Cardiovascular:     Rate and Rhythm: Normal rate and regular rhythm.     Heart sounds: No murmur heard. Pulmonary:     Effort: Tachypnea and accessory muscle usage present. No respiratory distress.     Breath sounds: Normal breath sounds.  Abdominal:     Palpations: Abdomen is soft.     Tenderness: There is no abdominal tenderness. There is no guarding or rebound.  Musculoskeletal:        General: Tenderness present.     Cervical back: Neck supple.     Comments: She has significant tenderness left hip and both knees.  She has some older appearing bruising throughout her left chest and left flank.  Extends down to her left hip area.  No significant shortening or rotation of her lower extremities.  Distal pulses intact.  Skin:    General: Skin is warm and dry.     Capillary Refill: Capillary refill takes less than 2 seconds.  Neurological:     General: No focal  deficit present.     Mental Status: She is alert and oriented to person, place, and time.     Sensory: No sensory deficit.     Motor: No weakness.     (all labs ordered are listed, but only abnormal results are displayed) Labs Reviewed  COMPREHENSIVE METABOLIC PANEL WITH GFR - Abnormal; Notable for the following components:      Result Value   CO2 20 (*)    Glucose, Bld 155 (*)    AST 43 (*)    All  other components within normal limits  URINALYSIS, ROUTINE W REFLEX MICROSCOPIC - Abnormal; Notable for the following components:   Color, Urine STRAW (*)    Specific Gravity, Urine 1.039 (*)    All other components within normal limits  HEMOGLOBIN A1C - Abnormal; Notable for the following components:   Hgb A1c MFr Bld 7.0 (*)    All other components within normal limits  GLUCOSE, CAPILLARY - Abnormal; Notable for the following components:   Glucose-Capillary 126 (*)    All other components within normal limits  I-STAT CHEM 8, ED - Abnormal; Notable for the following components:   Glucose, Bld 128 (*)    TCO2 20 (*)    All other components within normal limits  I-STAT CG4 LACTIC ACID, ED - Abnormal; Notable for the following components:   Lactic Acid, Venous 3.4 (*)    All other components within normal limits  I-STAT CG4 LACTIC ACID, ED - Abnormal; Notable for the following components:   Lactic Acid, Venous 2.9 (*)    All other components within normal limits  CULTURE, BLOOD (ROUTINE X 2)  CULTURE, BLOOD (ROUTINE X 2)  CBC  PROTIME-INR  CK  COMPREHENSIVE METABOLIC PANEL WITH GFR  CBC  I-STAT CG4 LACTIC ACID, ED  SAMPLE TO BLOOD BANK    EKG: EKG Interpretation Date/Time:  Friday December 03 2024 11:07:46 EST Ventricular Rate:  80 PR Interval:  161 QRS Duration:  160 QT Interval:  437 QTC Calculation: 505 R Axis:   44  Text Interpretation: Sinus rhythm Probable left ventricular hypertrophy Prolonged QT interval Confirmed by Towana Sharper [id number]) on 12/03/2024  11:09:28 AM  Radiology: [facility] Knee Complete 4 Views Right Result Date: 12/03/2024 CLINICAL DATA:  Bilateral knee injuries after fall. EXAM: RIGHT KNEE - COMPLETE 4+ VIEW COMPARISON:  None Available. FINDINGS: No evidence of fracture, dislocation, or joint effusion. Moderate narrowing of lateral joint space is noted with osteophyte formation. Chondrocalcinosis is noted medially. Soft tissues are unremarkable. 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 No acute abnormality seen. Electronically Signed   By: Lynwood Landy Raddle M.D.   On: 12/03/2024 14:04   DG Knee Complete 4 Views Left Result Date: 12/03/2024 CLINICAL DATA:  Bilateral knee abrasions after fall EXAM: LEFT KNEE - COMPLETE 4+ VIEW COMPARISON:  None Available. FINDINGS: No evidence of fracture, dislocation, or joint effusion. Moderate to severe narrowing of lateral joint space is noted with osteophyte formation. Chondrocalcinosis is noted medially. Soft tissues are unremarkable. IMPRESSION: Moderate to severe degenerative joint disease is noted laterally. No acute abnormality seen. Electronically Signed   By: Lynwood Landy Raddle M.D.   On: 12/03/2024 14:03   CT CHEST ABDOMEN PELVIS W CONTRAST Result Date: 12/03/2024 EXAM: CT CHEST, ABDOMEN AND PELVIS WITH CONTRAST 12/03/2024 12:28:00 PM TECHNIQUE: CT of the chest, abdomen and pelvis was performed with the administration of 75 mL of iohexol  (OMNIPAQUE ) 350 MG/ML injection. Multiplanar reformatted images are provided for review. Automated exposure control, iterative reconstruction, and/or weight based adjustment of the mA/kV was utilized to reduce the radiation dose to as low as reasonably achievable. COMPARISON: CT chest, abdomen, and pelvis 09/25/2024. CLINICAL HISTORY: 78 year old female with polytrauma, blunt, fall, left side injury. FINDINGS: CHEST: MEDIASTINUM AND LYMPH NODES: Heart and pericardium are unremarkable. Normal heart size. No pericardial effusion. The central  airways are clear. No mediastinal, hilar or axillary lymphadenopathy. LUNGS AND PLEURA: Lung volumes and major airways remain normal. No focal consolidation or pulmonary edema. No pleural effusion or pneumothorax. ABDOMEN AND  PELVIS: LIVER: Stable liver enhancement. GALLBLADDER AND BILE DUCTS: Status post cholecystectomy. No biliary ductal dilatation. SPLEEN: No acute abnormality. PANCREAS: No acute abnormality. ADRENAL GLANDS: No acute abnormality. KIDNEYS, URETERS AND BLADDER: Symmetric and normal renal contrast excretion. Diminutive ureters. No stones in the kidneys or ureters. No hydronephrosis. No perinephric or periureteral stranding. Distended urinary bladder, estimated volume 601 mL. GI AND BOWEL: Stomach is decompressed. Redundant large bowel. Mild retained stool. Nondilated bowel loops. There is no bowel obstruction. REPRODUCTIVE ORGANS: No acute abnormality. PERITONEUM AND RETROPERITONEUM: Stable small fat containing umbilical hernia. No ascites. No free air. VASCULATURE: Calcified coronary artery and aortic atherosclerosis. Aorta is normal in caliber. Major arterial structures and portal venous system appear to be patent. ABDOMINAL AND PELVIS LYMPH NODES: No lymphadenopathy. BONES AND SOFT TISSUES: Normal thoracic and lumbar segmentation. Widespread advanced spinal degeneration appears stable since 09/25/2024. 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 Chronic severe bilateral glenohumeral degeneration. No acute osseous abnormality. Stable mild breast tissue asymmetry. No superficial soft tissue injury identified. IMPRESSION: 1. No acute traumatic injury identified in the chest, abdomen, and pelvis. 2. Distended urinary bladder (estimated 601 mL). Query urinary retention. 3. Advanced chronic spinal degeneration. Electronically signed by: Helayne Hurst MD 12/03/2024 12:46 PM EST RP Workstation: [id number]   CT CERVICAL SPINE WO CONTRAST Result Date:  12/03/2024 EXAM: CT CERVICAL SPINE WITHOUT CONTRAST 12/03/2024 12:28:00 PM TECHNIQUE: CT of the cervical spine was performed without the administration of intravenous contrast. Multiplanar reformatted images are provided for review. Automated exposure control, iterative reconstruction, and/or weight based adjustment of the mA/kV was utilized to reduce the radiation dose to as low as reasonably achievable. COMPARISON: Cervical spine CT 09/25/2024. CLINICAL HISTORY: 78 year old female. Polytrauma, blunt. Fall, left side injury. FINDINGS: BONES AND ALIGNMENT: Mildly improved cervical lordosis. No acute fracture or traumatic malalignment. DEGENERATIVE CHANGES: Chronic C1-C2 degeneration. Advanced chronic cervical facet arthropathy on the right including vacuum facet at C2-C3 through C4-C5. There does appear to be chronic degenerative facet ankylosis at the cervicothoracic junction. No other convincing facet ankylosis. Chronic severe lower cervical disc and endplate degeneration at C5-C6 and C6-C7, with evidence of developing interbody ankylosis at the latter. Chronic cervical spine degeneration appears stable by CT. SOFT TISSUES: No prevertebral soft tissue swelling. Negative visible non-contrast thoracic inlet. Calcified left ICA origin atherosclerosis. Otherwise negative visible non-contrast neck soft tissues. IMPRESSION: 1. No acute traumatic injury identified in the cervical spine. 2. Stable chronic cervical spine degeneration. Electronically signed by: Helayne Hurst MD 12/03/2024 12:37 PM EST RP Workstation: [id number]   CT HEAD WO CONTRAST Result Date: 12/03/2024 EXAM: CT HEAD WITHOUT CONTRAST 12/03/2024 12:28:00 PM TECHNIQUE: CT of the head was performed without the administration of intravenous contrast. Automated exposure control, iterative reconstruction, and/or weight based adjustment of the mA/kV was utilized to reduce the radiation dose to as low as reasonably achievable. COMPARISON: Brain MRI  05/09/2020, Head CT 09/25/2024. CLINICAL HISTORY: 78 year old female. History of moderate-severe head trauma, fall, and left side injury. FINDINGS: BRAIN AND VENTRICLES: No acute hemorrhage. No evidence of acute infarct. No hydrocephalus. No extra-axial collection. No mass effect or midline shift. Brain volume stable, within normal limits for age. Basal ganglia vascular calcifications incidentally noted and stable. Stable gray white differentiation. Mild for age periventricular white matter hypodensity. No suspicious intracranial vascular hyperdensity. ORBITS: No acute abnormality. SINUSES: Paranasal sinuses, tympanic cavities and mastoids are well aerated. SOFT TISSUES AND SKULL: No acute soft tissue abnormality. No skull fracture. Hyperostosis of  the calvarium, normal variant. Calcified atherosclerosis at the skull base. IMPRESSION: 1. No acute traumatic injury identified. 2. Stable mild for age chronic white matter disease. Electronically signed by: Helayne Hurst MD 12/03/2024 12:34 PM EST RP Workstation: [id number]     Procedures   Medications Ordered in the ED  sodium chloride  flush (NS) 0.9 % injection 3 mL (3 mLs Intravenous Given 12/03/24 1551)  0.9 %  sodium chloride  infusion ( Intravenous Infusion Verify 12/03/24 1640)  acetaminophen  (TYLENOL ) tablet 650 mg (has no administration in time range)    Or  acetaminophen  (TYLENOL ) suppository 650 mg (has no administration in time range)  atorvastatin  (LIPITOR ) tablet 80 mg (has no administration in time range)  aspirin  EC tablet 81 mg (has no administration in time range)  citalopram  (CELEXA ) tablet 40 mg (has no administration in time range)  gabapentin  (NEURONTIN ) capsule 100 mg (has no administration in time range)  levothyroxine  (SYNTHROID ) tablet 50 mcg (has no administration in time range)  pantoprazole  (PROTONIX ) EC tablet 40 mg (has no administration in time range)  traZODone  (DESYREL ) tablet 50 mg (has no administration in time range)   insulin  aspart (novoLOG ) injection 0-15 Units (2 Units Subcutaneous Given 12/03/24 1557)  hydrALAZINE  (APRESOLINE ) injection 10 mg (10 mg Intravenous Given 12/03/24 1549)  HYDROmorphone  (DILAUDID ) injection 1 mg (1 mg Intravenous Given 12/03/24 1707)  metoprolol  succinate (TOPROL -XL) 24 hr tablet 25 mg (25 mg Oral Given 12/03/24 1658)  HYDROmorphone  (DILAUDID ) injection 1 mg (1 mg Intravenous Given 12/03/24 1131)  sodium chloride  0.9 % bolus 1,000 mL (0 mLs Intravenous Stopped 12/03/24 1451)  iohexol  (OMNIPAQUE ) 350 MG/ML injection 75 mL (75 mLs Intravenous Contrast Given 12/03/24 1227)  HYDROmorphone  (DILAUDID ) injection 1 mg (1 mg Intravenous Given 12/03/24 1236)    Clinical Course as of 12/03/24 1730  Fri Dec 03, 2024  1148 Lactic acid came back at 3.4.  Afebrile.  I have ordered blood cultures and IV fluids.  Will continue to monitor. [MB]  1420 Discussed with Dr. Tobie [facility] hospitalist who will evaluate for admission. [MB]    Clinical Course User Index [MB] Towana Ozell BROCKS, MD                                 Medical Decision Making Amount and/or Complexity of Data Reviewed Labs: ordered. Radiology: ordered.  Risk Prescription drug management. Decision regarding hospitalization.   This patient complains of fall, pain in her left chest and abdomen left flank left hip left knee right knee; this involves an extensive number of treatment Options and is a complaint that carries with it a high risk of complications and morbidity. The differential includes fracture, contusion, dislocation, intra-abdominal bleed, intractable pain,   I ordered, reviewed and interpreted labs, which included CBC normal chemistries with mildly low bicarb and elevated glucose, urinalysis without signs of infection, lactate elevated I ordered medication IV pain medicine and fluids and reviewed PMP when indicated. I ordered imaging studies which included x-rays of bilateral knees, CT head cervical spine  chest abdomen and pelvis and I independently    visualized and interpreted imaging which showed no acute traumatic findings.  Does have bladder enlargement. Previous records obtained and reviewed in epic, patient seen a few months ago for frequent falls I consulted [facility] hospitalist Dr. Tobie and discussed lab and imaging findings and discussed disposition.  Cardiac monitoring reviewed, sinus rhythm Social determinants considered, physically inactive socially isolated, stress Critical interventions, none  After the interventions stated above, I reevaluated the patient and found patient still to be in significant pain and would be unable to ambulate independently Admission and further testing considered, she would benefit from mission the hospital for further management and workup.  She is in agreement with plan for admission.      Final diagnoses:  Fall, initial encounter  Elevated lactic acid level  Injury of head, initial encounter  Contusion of left knee, initial encounter  Contusion of right knee, initial encounter    ED Discharge Orders     None          Towana Ozell BROCKS, MD 12/03/24 1735  "

## 2024-12-03 NOTE — ED Notes (Signed)
 Spoke with floor charge and aware patient coming up

## 2024-12-03 NOTE — Hospital Course (Signed)
 Alexandra Wolfe

## 2024-12-04 ENCOUNTER — Observation Stay (HOSPITAL_COMMUNITY)

## 2024-12-04 DIAGNOSIS — R296 Repeated falls: Secondary | ICD-10-CM

## 2024-12-04 DIAGNOSIS — E039 Hypothyroidism, unspecified: Secondary | ICD-10-CM

## 2024-12-04 DIAGNOSIS — R7989 Other specified abnormal findings of blood chemistry: Secondary | ICD-10-CM | POA: Diagnosis not present

## 2024-12-04 DIAGNOSIS — S8002XA Contusion of left knee, initial encounter: Secondary | ICD-10-CM | POA: Diagnosis not present

## 2024-12-04 DIAGNOSIS — E119 Type 2 diabetes mellitus without complications: Secondary | ICD-10-CM | POA: Diagnosis not present

## 2024-12-04 LAB — CBC
HCT: 41.3 % (ref 36.0–46.0)
Hemoglobin: 12.9 g/dL (ref 12.0–15.0)
MCH: 27.3 pg (ref 26.0–34.0)
MCHC: 31.2 g/dL (ref 30.0–36.0)
MCV: 87.5 fL (ref 80.0–100.0)
Platelets: 287 K/uL (ref 150–400)
RBC: 4.72 MIL/uL (ref 3.87–5.11)
RDW: 15.8 % — ABNORMAL HIGH (ref 11.5–15.5)
WBC: 11.3 K/uL — ABNORMAL HIGH (ref 4.0–10.5)
nRBC: 0 % (ref 0.0–0.2)

## 2024-12-04 LAB — COMPREHENSIVE METABOLIC PANEL WITH GFR
ALT: 30 U/L (ref 0–44)
AST: 57 U/L — ABNORMAL HIGH (ref 15–41)
Albumin: 4.3 g/dL (ref 3.5–5.0)
Alkaline Phosphatase: 83 U/L (ref 38–126)
Anion gap: 11 (ref 5–15)
BUN: 14 mg/dL (ref 8–23)
CO2: 23 mmol/L (ref 22–32)
Calcium: 9.3 mg/dL (ref 8.9–10.3)
Chloride: 103 mmol/L (ref 98–111)
Creatinine, Ser: 0.71 mg/dL (ref 0.44–1.00)
GFR, Estimated: >60 mL/min
Glucose, Bld: 158 mg/dL — ABNORMAL HIGH (ref 70–99)
Potassium: 4.2 mmol/L (ref 3.5–5.1)
Sodium: 137 mmol/L (ref 135–145)
Total Bilirubin: 0.5 mg/dL (ref 0.0–1.2)
Total Protein: 6.7 g/dL (ref 6.5–8.1)

## 2024-12-04 LAB — GLUCOSE, CAPILLARY
Glucose-Capillary: 152 mg/dL — ABNORMAL HIGH (ref 70–99)
Glucose-Capillary: 100 mg/dL — ABNORMAL HIGH (ref 70–99)
Glucose-Capillary: 108 mg/dL — ABNORMAL HIGH (ref 70–99)
Glucose-Capillary: 136 mg/dL — ABNORMAL HIGH (ref 70–99)

## 2024-12-04 MED ORDER — ENOXAPARIN SODIUM 40 MG/0.4ML IJ SOSY
40.0000 mg | PREFILLED_SYRINGE | INTRAMUSCULAR | Status: DC
  Administered 2024-12-04 – 2024-12-06 (×3): 40 mg via SUBCUTANEOUS
  Filled 2024-12-04 (×2): qty 0.4

## 2024-12-04 MED ORDER — SODIUM CHLORIDE 0.9 % IV SOLN: INTRAVENOUS | Status: AC

## 2024-12-04 MED ORDER — TRAMADOL HCL 50 MG PO TABS
50.0000 mg | ORAL_TABLET | Freq: Four times a day (QID) | ORAL | Status: DC | PRN
  Administered 2024-12-04 – 2024-12-07 (×5): 50 mg via ORAL
  Filled 2024-12-04 (×2): qty 1

## 2024-12-04 MED ORDER — CITALOPRAM HYDROBROMIDE 20 MG PO TABS
20.0000 mg | ORAL_TABLET | Freq: Every day | ORAL | Status: DC
  Administered 2024-12-05 – 2024-12-07 (×3): 20 mg via ORAL
  Filled 2024-12-04: qty 1

## 2024-12-04 NOTE — Evaluation (Signed)
 Physical Therapy Evaluation Patient Details Name: Alexandra Wolfe MRN: [id number] DOB: 08-Apr-1946 Today's Date: 12/04/2024  History of Present Illness  78 y.o. female presents to [facility] 12/03/24 after falling at home. Imaging negative for acute fxs. PMHx: T2DM with neuropathy, morbid obesity, asthma, urinary incontinence, SDH/TBI/neurocognitive deficit, osteoarthritis, lumbar laminectomy, hypothyroidism, anxiety, depression and GERD   Clinical Impression  Pt from home alone where she would primarily stay in a lift chair and would ambulate short distances with use of rollator. Pt reported at least 5 falls due to losing her balance. Pt required up to [facility] for bed mobility and ModA to maintain seated balance. With dynamic activities, pt would lean posteriorly requiring support to maintain upright posture. Pt was able to stand with heavy ModA/MaxA and use of RW. Pt was only able to take a few steps forwards due to pain/weakness. Performed lateral steps towards HOB with MinA and RW. Recommending <3hrs post acute rehab to prevent future falls and work towards independence with mobility. Acute PT to follow.         If plan is discharge home, recommend the following: A lot of help with walking and/or transfers;A lot of help with bathing/dressing/bathroom;Assist for transportation;Help with stairs or ramp for entrance   Can travel by private vehicle   No    Equipment Recommendations Rolling walker (2 wheels)     Functional Status Assessment Patient has had a recent decline in their functional status and demonstrates the ability to make significant improvements in function in a reasonable and predictable amount of time.     Precautions / Restrictions Precautions Precautions: Fall Recall of Precautions/Restrictions: Intact Restrictions Weight Bearing Restrictions Per Provider Order: No      Mobility  Bed Mobility Overal bed mobility: Needs Assistance Bed Mobility: Rolling, Sidelying to Sit,  Sit to Supine Rolling: Contact guard assist, Used rails Sidelying to sit: Min assist, Used rails   Sit to supine: Mod assist   General bed mobility comments: able to bring LE's off EOB with MinA to raise trunk. ModA for return to supine for LE management    Transfers Overall transfer level: Needs assistance Equipment used: Rolling walker (2 wheels) Transfers: Sit to/from Stand Sit to Stand: Mod assist, Max assist    General transfer comment: Heavy ModA to MaxA to boost-up with cues for hand placement    Ambulation/Gait Ambulation/Gait assistance: Min assist Gait Distance (Feet): 2 Feet Assistive device: Rolling walker (2 wheels) Gait Pattern/deviations: Step-to pattern, Decreased stride length, Shuffle Gait velocity: decr    General Gait Details: Only able to take a few steps forwards 2/2 pain and weakness. Performed sideways steps towards [facility] with MinA for RW management. Heavy reliance on UE support. Deferred further gait due to need for +2 for chair follow     Balance Overall balance assessment: Needs assistance, Mild deficits observed, not formally tested, History of Falls Sitting-balance support: Feet supported, Bilateral upper extremity supported Sitting balance-Leahy Scale: Poor Sitting balance - Comments: intermittent ModA for posterior lean with dynamic activities Postural control: Posterior lean Standing balance support: Bilateral upper extremity supported, During functional activity, Reliant on assistive device for balance Standing balance-Leahy Scale: Poor Standing balance comment: reliant on BUE and external support         Pertinent Vitals/Pain Pain Assessment Pain Assessment: Faces Faces Pain Scale: Hurts little more Pain Location: all over Pain Descriptors / Indicators: Aching, Discomfort Pain Intervention(s): Limited activity within patient's tolerance, Monitored during session, Repositioned    Home Living Family/patient expects to  be discharged  to:: Private residence Living Arrangements: Alone Available Help at Discharge: Family;Available PRN/intermittently Type of Home: Apartment Home Access: Level entry    Home Layout: One level Home Equipment: Rollator (4 wheels);Wheelchair - manual;Shower seat;BSC/3in1;Lift chair Additional Comments: patient stating she sleeps in the bed, and will nap in the recliner during the day.  Has a life alert    Prior Function Prior Level of Function : Independent/Modified Independent    Mobility Comments: Pt uses lift chair to stand during the day. ModI for short distances with rollator. x5 falls ADLs Comments: son drives and assist with groceries.  Patient performs her own ADL, iADL, medications and bill payment     Extremity/Trunk Assessment   Upper Extremity Assessment Upper Extremity Assessment: Defer to OT evaluation    Lower Extremity Assessment Lower Extremity Assessment: Generalized weakness       Communication   Communication Communication: No apparent difficulties    Cognition Arousal: Alert Behavior During Therapy: WFL for tasks assessed/performed   PT - Cognitive impairments: No apparent impairments    Following commands: Intact       Cueing Cueing Techniques: Verbal cues, Tactile cues     General Comments General comments (skin integrity, edema, etc.): VSS on RA     PT Assessment Patient needs continued PT services  PT Problem List Decreased strength;Decreased activity tolerance;Decreased balance;Decreased mobility       PT Treatment Interventions Gait training;DME instruction;Functional mobility training;Therapeutic activities;Therapeutic exercise;Balance training;Neuromuscular re-education;Patient/family education    PT Goals (Current goals can be found in the Care Plan section)  Acute Rehab PT Goals Patient Stated Goal: to get stronger PT Goal Formulation: With patient Time For Goal Achievement: 12/18/24 Potential to Achieve Goals: Good    Frequency  Min 2X/week        AM-PAC PT 6 Clicks Mobility  Outcome Measure Help needed turning from your back to your side while in a flat bed without using bedrails?: A Little Help needed moving from lying on your back to sitting on the side of a flat bed without using bedrails?: A Little Help needed moving to and from a bed to a chair (including a wheelchair)?: A Lot Help needed standing up from a chair using your arms (e.g., wheelchair or bedside chair)?: A Lot Help needed to walk in hospital room?: Total Help needed climbing 3-5 steps with a railing? : Total 6 Click Score: 12    End of Session Equipment Utilized During Treatment: Gait belt Activity Tolerance: Patient tolerated treatment well Patient left: in bed;with call bell/phone within reach;with bed alarm set Nurse Communication: Mobility status;Patient requests pain meds PT Visit Diagnosis: Unsteadiness on feet (R26.81);Other abnormalities of gait and mobility (R26.89);Muscle weakness (generalized) (M62.81);History of falling (Z91.81)    Time: 8754-8692 PT Time Calculation (min) (ACUTE ONLY): 22 min   Charges:   PT Evaluation $PT Eval Low Complexity: 1 Low   PT General Charges $$ ACUTE PT VISIT: 1 Visit        Kate ORN, PT, DPT Secure Chat Preferred  Rehab Office [phone]   Kate BRAVO Wendolyn 12/04/2024, 1:45 PM

## 2024-12-04 NOTE — Care Management Obs Status (Signed)
 MEDICARE OBSERVATION STATUS NOTIFICATION   Patient Details  Name: Alexandra Wolfe MRN: [id number] Date of Birth: [birth year]   Medicare Observation Status Notification Given:  Yes    Marval Gell, RN 12/04/2024, 3:35 PM

## 2024-12-04 NOTE — Progress Notes (Signed)
 "        [facility]                                                                               Rockelle Heuerman, is a 78 y.o. female, DOB - 12/26/1945, FMW:[id number] Admit date - 12/03/2024    Outpatient Primary MD for the patient is Pcp, No  LOS - 0  days    Brief summary   TASHONA CALK is a 78 y.o. female with past medical history  of  T2DM with neuropathy, morbid obesity, asthma, urinary incontinence, SDH/TBI/neurocognitive deficit, osteoarthritis, lumbar laminectomy, hypothyroidism, anxiety, depression and GERD presenting with recurrent falls, dysuria and suprapubic pain coming in for falls and loss of balance. Patient lives at home and independent.   Assessment & Plan    Assessment and Plan:   Recurrent falls from increased debility She lives alone and has had recurrent falls as per the son.  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  She reports falling due to intermittent vertigo and her knees giving out.  So far no source of infection found. She is probably dehydrated as her lactic acid was elevated.  She was started on IV fluids with improvement in lactic acid.      Type 2 DM with hyperglycemia CBG (last 3)  Recent Labs    12/04/24 0737 12/04/24 1149 12/04/24 1557  GLUCAP 152* 100* 108*   Resume SSI.  A1c is 7.    Lactic acidosis Resolved with IV fluids.    Obesity Body mass index is 36.53 kg/m.    GERD Stable.    Anxiety and depression Resume home meds.    Hypothyroidism Resume synthroid .    Hyperlipidemia:  Resume lipitor .       Estimated body mass index is 36.53 kg/m as calculated from the following:   Height as of this encounter: 5' 6 (1.676 m).   Weight as of this encounter: 102.6 kg.  Code Status: full code.  DVT Prophylaxis:  SCDs Start: 12/03/24 1442   Level of Care: Level of care: Telemetry Family Communication: discussed the plan with her son at bedside.    Disposition Plan:     Remains inpatient appropriate:  pending clinical improvement and possibly to SNF when bed available.    Procedures:  None   Consultants:   None.   Antimicrobials:   Anti-infectives (From admission, onward)    None        Medications  Scheduled Meds:  aspirin  EC  81 mg Oral Daily   atorvastatin   80 mg Oral Daily   citalopram   40 mg Oral Daily   gabapentin   100 mg Oral TID   insulin  aspart  0-15 Units Subcutaneous TID WC   levothyroxine   50 mcg Oral Q0600   metoprolol  succinate  25 mg Oral Daily   pantoprazole   40 mg Oral Daily   sodium chloride  flush  3 mL Intravenous Q12H   traZODone   50 mg Oral QHS   Continuous Infusions:  sodium chloride  50 mL/hr at 12/03/24 1640   PRN Meds:.acetaminophen  **OR** acetaminophen , hydrALAZINE , traMADol     Subjective:   Cree Napoli was seen  and examined today.   Reports feeling weak, and tired. No chest pain or sob. Reports having pain   Objective:   Vitals:   12/03/24 2326 12/04/24 0601 12/04/24 0740 12/04/24 1152  BP: (!) 149/69 (!) 156/75 (!) 157/69 (!) 141/57  Pulse: 77 86 87 78  Resp:  17 18 18   Temp: 97.6 F (36.4 C) (!) 97.5 F (36.4 C) 98.4 F (36.9 C) 98.7 F (37.1 C)  TempSrc: Oral Oral Oral   SpO2: 96% 98% 92% 94%  Weight:  102.6 kg    Height:        Intake/Output Summary (Last 24 hours) at 12/04/2024 1426 Last data filed at 12/04/2024 0600 Gross per 24 hour  Intake 2568.01 ml  Output 1300 ml  Net 1268.01 ml   Filed Weights   12/03/24 1104 12/04/24 0601  Weight: 99.8 kg 102.6 kg     Exam General exam: Appears calm and comfortable  Respiratory system: Clear to auscultation. Respiratory effort normal. Cardiovascular system: S1 & S2 heard, RRR.  Gastrointestinal system: Abdomen is soft bs+ Central nervous system: Alert and oriented to person and place.  Extremities: tenderness on the knees.  Skin: No rashes, Psychiatry: Mood & affect appropriate.     Data Reviewed:   I have personally reviewed following labs and imaging studies   CBC Lab Results  Component Value Date   WBC 11.3 (H) 12/04/2024   RBC 4.72 12/04/2024   HGB 12.9 12/04/2024   HCT 41.3 12/04/2024   MCV 87.5 12/04/2024   MCH 27.3 12/04/2024   PLT 287 12/04/2024   MCHC 31.2 12/04/2024   RDW 15.8 (H) 12/04/2024   LYMPHSABS 3.8 09/25/2024   MONOABS 0.8 09/25/2024   EOSABS 0.1 09/25/2024   BASOSABS 0.1 09/25/2024     Last metabolic panel Lab Results  Component Value Date   NA 137 12/04/2024   K 4.2 12/04/2024   CL 103 12/04/2024   CO2 23 12/04/2024   BUN 14 12/04/2024   CREATININE 0.71 12/04/2024   GLUCOSE 158 (H) 12/04/2024   GFRNONAA >60 12/04/2024   GFRAA >60 05/09/2020   CALCIUM  9.3 12/04/2024   PHOS 4.2 02/21/2024   PROT 6.7 12/04/2024   ALBUMIN 4.3 12/04/2024   BILITOT 0.5 12/04/2024   ALKPHOS 83 12/04/2024   AST 57 (H) 12/04/2024   ALT 30 12/04/2024   ANIONGAP 11 12/04/2024    CBG (last 3)  Recent Labs    12/03/24 1555 12/04/24 0737 12/04/24 1149  GLUCAP 126* 152* 100*      Coagulation Profile: Recent Labs  Lab 12/03/24 1130  INR 1.1     Radiology Studies: DG Knee Complete 4 Views Right Result Date: 12/03/2024 CLINICAL DATA:  Bilateral knee injuries after fall. EXAM: RIGHT KNEE - COMPLETE 4+ VIEW COMPARISON:  None Available. FINDINGS: No evidence of fracture, dislocation, or joint effusion. Moderate narrowing of lateral joint space is noted with osteophyte formation. Chondrocalcinosis is noted medially. Soft tissues are unremarkable. 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 No acute abnormality seen. Electronically Signed   By: Lynwood Landy Raddle M.D.   On: 12/03/2024 14:04   DG Knee Complete 4 Views Left Result Date: 12/03/2024 CLINICAL DATA:  Bilateral knee abrasions after fall EXAM: LEFT KNEE - COMPLETE 4+ VIEW COMPARISON:  None Available. FINDINGS: No evidence of fracture, dislocation, or joint effusion. Moderate to  severe narrowing of lateral joint space is noted with osteophyte formation. Chondrocalcinosis is noted medially. Soft tissues are unremarkable. IMPRESSION: Moderate to severe degenerative  joint disease is noted laterally. No acute abnormality seen. Electronically Signed   By: Lynwood Landy Raddle M.D.   On: 12/03/2024 14:03   CT CHEST ABDOMEN PELVIS W CONTRAST Result Date: 12/03/2024 EXAM: CT CHEST, ABDOMEN AND PELVIS WITH CONTRAST 12/03/2024 12:28:00 PM TECHNIQUE: CT of the chest, abdomen and pelvis was performed with the administration of 75 mL of iohexol  (OMNIPAQUE ) 350 MG/ML injection. Multiplanar reformatted images are provided for review. Automated exposure control, iterative reconstruction, and/or weight based adjustment of the mA/kV was utilized to reduce the radiation dose to as low as reasonably achievable. COMPARISON: CT chest, abdomen, and pelvis 09/25/2024. CLINICAL HISTORY: 78 year old female with polytrauma, blunt, fall, left side injury. FINDINGS: CHEST: MEDIASTINUM AND LYMPH NODES: Heart and pericardium are unremarkable. Normal heart size. No pericardial effusion. The central airways are clear. No mediastinal, hilar or axillary lymphadenopathy. LUNGS AND PLEURA: Lung volumes and major airways remain normal. No focal consolidation or pulmonary edema. No pleural effusion or pneumothorax. ABDOMEN AND PELVIS: LIVER: Stable liver enhancement. GALLBLADDER AND BILE DUCTS: Status post cholecystectomy. No biliary ductal dilatation. SPLEEN: No acute abnormality. PANCREAS: No acute abnormality. ADRENAL GLANDS: No acute abnormality. KIDNEYS, URETERS AND BLADDER: Symmetric and normal renal contrast excretion. Diminutive ureters. No stones in the kidneys or ureters. No hydronephrosis. No perinephric or periureteral stranding. Distended urinary bladder, estimated volume 601 mL. GI AND BOWEL: Stomach is decompressed. Redundant large bowel. Mild retained stool. Nondilated bowel loops. There is no bowel obstruction.  REPRODUCTIVE ORGANS: No acute abnormality. PERITONEUM AND RETROPERITONEUM: Stable small fat containing umbilical hernia. No ascites. No free air. VASCULATURE: Calcified coronary artery and aortic atherosclerosis. Aorta is normal in caliber. Major arterial structures and portal venous system appear to be patent. ABDOMINAL AND PELVIS LYMPH NODES: No lymphadenopathy. BONES AND SOFT TISSUES: Normal thoracic and lumbar segmentation. Widespread advanced spinal degeneration appears stable since 09/25/2024. 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 Chronic severe bilateral glenohumeral degeneration. No acute osseous abnormality. Stable mild breast tissue asymmetry. No superficial soft tissue injury identified. IMPRESSION: 1. No acute traumatic injury identified in the chest, abdomen, and pelvis. 2. Distended urinary bladder (estimated 601 mL). Query urinary retention. 3. Advanced chronic spinal degeneration. Electronically signed by: Helayne Hurst MD 12/03/2024 12:46 PM EST RP Workstation: [id number]   CT CERVICAL SPINE WO CONTRAST Result Date: 12/03/2024 EXAM: CT CERVICAL SPINE WITHOUT CONTRAST 12/03/2024 12:28:00 PM TECHNIQUE: CT of the cervical spine was performed without the administration of intravenous contrast. Multiplanar reformatted images are provided for review. Automated exposure control, iterative reconstruction, and/or weight based adjustment of the mA/kV was utilized to reduce the radiation dose to as low as reasonably achievable. COMPARISON: Cervical spine CT 09/25/2024. CLINICAL HISTORY: 78 year old female. Polytrauma, blunt. Fall, left side injury. FINDINGS: BONES AND ALIGNMENT: Mildly improved cervical lordosis. No acute fracture or traumatic malalignment. DEGENERATIVE CHANGES: Chronic C1-C2 degeneration. Advanced chronic cervical facet arthropathy on the right including vacuum facet at C2-C3 through C4-C5. There does appear to be chronic degenerative  facet ankylosis at the cervicothoracic junction. No other convincing facet ankylosis. Chronic severe lower cervical disc and endplate degeneration at C5-C6 and C6-C7, with evidence of developing interbody ankylosis at the latter. Chronic cervical spine degeneration appears stable by CT. SOFT TISSUES: No prevertebral soft tissue swelling. Negative visible non-contrast thoracic inlet. Calcified left ICA origin atherosclerosis. Otherwise negative visible non-contrast neck soft tissues. IMPRESSION: 1. No acute traumatic injury identified in the cervical spine. 2. Stable chronic cervical spine degeneration. Electronically signed by:  Helayne Hurst MD 12/03/2024 12:37 PM EST RP Workstation: [id number]   CT HEAD WO CONTRAST Result Date: 12/03/2024 EXAM: CT HEAD WITHOUT CONTRAST 12/03/2024 12:28:00 PM TECHNIQUE: CT of the head was performed without the administration of intravenous contrast. Automated exposure control, iterative reconstruction, and/or weight based adjustment of the mA/kV was utilized to reduce the radiation dose to as low as reasonably achievable. COMPARISON: Brain MRI 05/09/2020, Head CT 09/25/2024. CLINICAL HISTORY: 78 year old female. History of moderate-severe head trauma, fall, and left side injury. FINDINGS: BRAIN AND VENTRICLES: No acute hemorrhage. No evidence of acute infarct. No hydrocephalus. No extra-axial collection. No mass effect or midline shift. Brain volume stable, within normal limits for age. Basal ganglia vascular calcifications incidentally noted and stable. Stable gray white differentiation. Mild for age periventricular white matter hypodensity. No suspicious intracranial vascular hyperdensity. ORBITS: No acute abnormality. SINUSES: Paranasal sinuses, tympanic cavities and mastoids are well aerated. SOFT TISSUES AND SKULL: No acute soft tissue abnormality. No skull fracture. Hyperostosis of the calvarium, normal variant. Calcified atherosclerosis at the skull base. IMPRESSION: 1. No  acute traumatic injury identified. 2. Stable mild for age chronic white matter disease. Electronically signed by: Helayne Hurst MD 12/03/2024 12:34 PM EST RP Workstation: [id number]       Elgie Butter M.D. [facility] 12/04/2024, 2:26 PM  Available via Epic secure chat 7am-7pm After 7 pm, please refer to night coverage provider listed on amion.    "

## 2024-12-05 DIAGNOSIS — R7989 Other specified abnormal findings of blood chemistry: Secondary | ICD-10-CM | POA: Diagnosis not present

## 2024-12-05 DIAGNOSIS — E119 Type 2 diabetes mellitus without complications: Secondary | ICD-10-CM | POA: Diagnosis not present

## 2024-12-05 DIAGNOSIS — S8002XA Contusion of left knee, initial encounter: Secondary | ICD-10-CM | POA: Diagnosis not present

## 2024-12-05 DIAGNOSIS — E039 Hypothyroidism, unspecified: Secondary | ICD-10-CM | POA: Diagnosis not present

## 2024-12-05 LAB — GLUCOSE, CAPILLARY
Glucose-Capillary: 182 mg/dL — ABNORMAL HIGH (ref 70–99)
Glucose-Capillary: 179 mg/dL — ABNORMAL HIGH (ref 70–99)
Glucose-Capillary: 117 mg/dL — ABNORMAL HIGH (ref 70–99)
Glucose-Capillary: 141 mg/dL — ABNORMAL HIGH (ref 70–99)

## 2024-12-05 MED ORDER — ALPRAZOLAM 0.25 MG PO TABS
0.2500 mg | ORAL_TABLET | Freq: Two times a day (BID) | ORAL | Status: DC | PRN
  Administered 2024-12-05: 0.25 mg via ORAL
  Filled 2024-12-05: qty 1

## 2024-12-05 NOTE — [place] FL2 (Deleted)
 " [place]  MEDICAID FL2 LEVEL OF CARE FORM     IDENTIFICATION  Patient Name: Alexandra Wolfe Birthdate: 12-Apr-1946 Sex: female Admission Date (Current Location): 12/03/2024  [facility] and [place] Number:  Producer, Television/film/video and Address:  [facility] [place], [address], [place], [place] [address]      Provider Number: [id number]  Attending Physician Name and Address:  Cherlyn Labella, MD  Relative Name and Phone Number:  Jayleena, Stille [phone]    Current Level of Care: SNF Recommended Level of Care: Skilled Nursing Facility Prior Approval Number:    Date Approved/Denied: 12/05/24 PASRR Number: [id number]  Discharge Plan: SNF    Current Diagnoses: Patient Active Problem List   Diagnosis Date Noted   Fall at home, initial encounter 12/03/2024   Anemia 03/31/2024   Spinal stenosis of lumbar region 03/31/2024   Recurrent UTI 03/31/2024   UTI (urinary tract infection) 02/19/2024   Sepsis (HCC) 02/19/2024   Anxiety 11/26/2022   Chronic pain of both knees 12/15/2021   GERD (gastroesophageal reflux disease) 12/14/2021   Insomnia 12/14/2021   Morbid obesity (HCC) 01/13/2021   Recurrent falls 07/05/2020   Body mass index (BMI) 37.0-37.9, adult 04/10/2020   TBI (traumatic brain injury) (HCC) 04/10/2020   Neurocognitive deficits 03/30/2020   Acute subdural hematoma 02/29/2020   Syncope 02/29/2020   Mild renal insufficiency 02/29/2020   Essential hypertension 02/29/2020   Asthma 02/29/2020   Chronic dyspnea 01/04/2020   Allergic rhinitis 01/04/2020   Bilateral foot pain 07/01/2019   Pain in left knee 01/05/2019   Pain in right knee 01/05/2019   Diabetic neuropathy (HCC) 03/04/2018   Obstructive sleep apnea 04/04/2016   Peripheral edema 12/23/2015   Hypersomnolence 12/22/2015   Baker's cyst of knee 11/24/2013   Left knee pain 11/24/2013   Rash 05/26/2012   Bilateral shoulder pain 12/13/2011   Diabetes mellitus type II, non insulin   dependent (HCC) 11/20/2011   Leukocytosis 11/20/2011   Preoperative clearance 11/20/2011   Eustachian tube dysfunction 05/20/2011   Low back pain 05/20/2011   Encounter for well adult exam with abnormal findings 05/20/2011   Vertigo 05/20/2011   Hypothyroidism 02/15/2011   Dysphagia 02/15/2011   URINARY INCONTINENCE 08/23/2009   Seasonal and perennial allergic rhinitis 04/02/2009   Constipation 09/27/2008   Hyperlipidemia 03/11/2008   BUNIONS, BILATERAL 12/23/2007   Depression 09/13/2007   Degenerative arthritis of knee, bilateral 09/13/2007   History of colonic polyps 09/13/2007    Orientation RESPIRATION BLADDER Height & Weight     Self, Time, Situation, Place  Normal Incontinent Weight: 226 lb 4.8 oz (102.6 kg) Height:  5' 6 (167.6 cm)  BEHAVIORAL SYMPTOMS/MOOD NEUROLOGICAL BOWEL NUTRITION STATUS      Continent Diet  AMBULATORY STATUS COMMUNICATION OF NEEDS Skin   Limited Assist Verbally Normal                       Personal Care Assistance Level of Assistance              Functional Limitations Info  Sight Sight Info: Impaired        SPECIAL CARE FACTORS FREQUENCY  PT (By licensed PT), OT (By licensed OT)     PT Frequency: 5x wk OT Frequency: 3x wk            Contractures      Additional Factors Info  Code Status Code Status Info: Full  Current Medications (12/05/2024):  This is the current hospital active medication list Current Facility-Administered Medications  Medication Dose Route Frequency Provider Last Rate Last Admin   0.9 %  sodium chloride  infusion   Intravenous Continuous Akula, Vijaya, MD 50 mL/hr at 12/04/24 2054 Restarted at 12/04/24 2054   acetaminophen  (TYLENOL ) tablet 650 mg  650 mg Oral Q6H PRN Patel, Ekta V, MD   650 mg at 12/05/24 0601   Or   acetaminophen  (TYLENOL ) suppository 650 mg  650 mg Rectal Q6H PRN Patel, Ekta V, MD       ALPRAZolam  (XANAX ) tablet 0.25 mg  0.25 mg Oral BID PRN Duncan, Hazel V, MD    0.25 mg at 12/05/24 0027   aspirin  EC tablet 81 mg  81 mg Oral Daily Patel, Ekta V, MD   81 mg at 12/05/24 9164   atorvastatin  (LIPITOR ) tablet 80 mg  80 mg Oral Daily Patel, Ekta V, MD   80 mg at 12/05/24 9164   citalopram  (CELEXA ) tablet 20 mg  20 mg Oral Daily Akula, Vijaya, MD   20 mg at 12/05/24 9165   enoxaparin  (LOVENOX ) injection 40 mg  40 mg Subcutaneous Q24H Akula, Vijaya, MD   40 mg at 12/04/24 1719   gabapentin  (NEURONTIN ) capsule 100 mg  100 mg Oral TID Patel, Ekta V, MD   100 mg at 12/05/24 0835   hydrALAZINE  (APRESOLINE ) injection 10 mg  10 mg Intravenous Q4H PRN Patel, Ekta V, MD   10 mg at 12/05/24 9660   insulin  aspart (novoLOG ) injection 0-15 Units  0-15 Units Subcutaneous TID WC Tobie Mario GAILS, MD   3 Units at 12/05/24 1158   levothyroxine  (SYNTHROID ) tablet 50 mcg  50 mcg Oral Q0600 Tobie Mario GAILS, MD   50 mcg at 12/05/24 9441   metoprolol  succinate (TOPROL -XL) 24 hr tablet 25 mg  25 mg Oral Daily Patel, Ekta V, MD   25 mg at 12/05/24 0835   pantoprazole  (PROTONIX ) EC tablet 40 mg  40 mg Oral Daily Patel, Ekta V, MD   40 mg at 12/05/24 0835   sodium chloride  flush (NS) 0.9 % injection 3 mL  3 mL Intravenous Q12H Tobie Mario V, MD   3 mL at 12/05/24 1158   traMADol  (ULTRAM ) tablet 50 mg  50 mg Oral Q6H PRN Akula, Vijaya, MD   50 mg at 12/05/24 1046   traZODone  (DESYREL ) tablet 50 mg  50 mg Oral QHS Patel, Ekta V, MD   50 mg at 12/04/24 2103     Discharge Medications: Please see discharge summary for a list of discharge medications.  Relevant Imaging Results:  Relevant Lab Results:   Additional Information SSN: [id number]  Darin JULIANNA Bend, LCSW     "

## 2024-12-05 NOTE — Plan of Care (Signed)

## 2024-12-05 NOTE — Evaluation (Signed)
 Occupational Therapy Evaluation Patient Details Name: Alexandra Wolfe MRN: [id number] DOB: 10-27-1946 Today's Date: 12/05/2024   History of Present Illness   78 y.o. female [facility] to [facility] 12/03/24 after falling at home. Imaging negative for acute fxs. PMHx: T2DM with neuropathy, morbid obesity, asthma, urinary incontinence, SDH/TBI/neurocognitive deficit, osteoarthritis, lumbar laminectomy, hypothyroidism, anxiety, depression and GERD     Clinical Impressions Pt reports ind at baseline with ADLs and uses rollator for short distance mobility, son assists with transportation and IADLs. Pt currently needs up to max A for ADLs, min A for bed mobility, and mod +2 for transfers with RW. Pt with audible SOB, cues for PLB with pivot transfer. Pt presenting with impairments listed below, will follow acutely. Patient will benefit from continued inpatient follow up therapy, <3 hours/day to maximize safety/ind with ADL/functional mobility.      If plan is discharge home, recommend the following:   Two people to help with walking and/or transfers;A lot of help with bathing/dressing/bathroom;Assistance with cooking/housework;Assist for transportation;Help with stairs or ramp for entrance     Functional Status Assessment   Patient has had a recent decline in their functional status and demonstrates the ability to make significant improvements in function in a reasonable and predictable amount of time.     Equipment Recommendations   Other (comment) (RW)     Recommendations for Other Services   PT consult     Precautions/Restrictions   Precautions Precautions: Fall Recall of Precautions/Restrictions: Intact Restrictions Weight Bearing Restrictions Per Provider Order: No     Mobility Bed Mobility Overal bed mobility: Needs Assistance Bed Mobility: Rolling, Sidelying to Sit, Sit to Supine Rolling: Used rails, Min assist Sidelying to sit: Min assist       General bed  mobility comments: from flat/slightly elevated bed    Transfers Overall transfer level: Needs assistance Equipment used: Rolling walker (2 wheels) Transfers: Sit to/from Stand Sit to Stand: Mod assist, +2 physical assistance                  Balance Overall balance assessment: Needs assistance, Mild deficits observed, not formally tested, History of Falls Sitting-balance support: Feet supported, Bilateral upper extremity supported Sitting balance-Leahy Scale: Good     Standing balance support: Bilateral upper extremity supported, During functional activity, Reliant on assistive device for balance Standing balance-Leahy Scale: Poor Standing balance comment: reliant on BUE and external support                           ADL either performed or assessed with clinical judgement   ADL Overall ADL's : Needs assistance/impaired Eating/Feeding: Set up   Grooming: Contact guard assist;Sitting   Upper Body Bathing: Moderate assistance;Sitting   Lower Body Bathing: Moderate assistance;Sitting/lateral leans   Upper Body Dressing : Moderate assistance;Sitting   Lower Body Dressing: Maximal assistance;Sitting/lateral leans   Toilet Transfer: Moderate assistance;+2 for physical assistance   Toileting- Clothing Manipulation and Hygiene: Maximal assistance       Functional mobility during ADLs: Moderate assistance;+2 for physical assistance;Rolling walker (2 wheels)       Vision   Vision Assessment?: No apparent visual deficits     Perception Perception: Not tested       Praxis Praxis: Not tested       Pertinent Vitals/Pain Pain Assessment Pain Assessment: No/denies pain     Extremity/Trunk Assessment Upper Extremity Assessment Upper Extremity Assessment: Generalized weakness   Lower Extremity Assessment Lower Extremity Assessment: Defer  to PT evaluation   Cervical / Trunk Assessment Cervical / Trunk Assessment: Normal   Communication  Communication Communication: No apparent difficulties   Cognition Arousal: Alert Behavior During Therapy: WFL for tasks assessed/performed Cognition: No family/caregiver present to determine baseline             OT - Cognition Comments: some overall decr insight to current deficits and level of assist needed                 Following commands: Intact       Cueing  General Comments   Cueing Techniques: Verbal cues;Tactile cues  VSS on RA   Exercises     Shoulder Instructions      Home Living Family/patient expects to be discharged to:: Private residence Living Arrangements: Alone Available Help at Discharge: Family;Available PRN/intermittently (reports son will be there when she calls him) Type of Home: Apartment Home Access: Level entry     Home Layout: One level     Bathroom Shower/Tub: Chief Strategy Officer: Standard Bathroom Accessibility: Yes How Accessible: Accessible via walker Home Equipment: Rollator (4 wheels);Wheelchair - manual;Shower seat;BSC/3in1;Lift chair   Additional Comments: patient stating she sleeps in the bed, and will nap in the recliner during the day.  Has a life alert      Prior Functioning/Environment Prior Level of Function : Independent/Modified Independent             Mobility Comments: Pt uses lift chair to stand during the day. ModI for short distances with rollator. x5 falls ADLs Comments: son drives and assist with groceries.  Patient performs her own ADL, iADL, medications and bill payment    OT Problem List: Decreased strength;Decreased activity tolerance;Decreased range of motion;Impaired balance (sitting and/or standing);Decreased cognition;Decreased coordination   OT Treatment/Interventions: Self-care/ADL training;Therapeutic exercise;Energy conservation;DME and/or AE instruction;Therapeutic activities;Patient/family education;Balance training      OT Goals(Current goals can be found in the  care plan section)   Acute Rehab OT Goals Patient Stated Goal: none stated OT Goal Formulation: With patient Time For Goal Achievement: 12/19/24 Potential to Achieve Goals: Good ADL Goals Pt Will Perform Grooming: with contact guard assist;standing Pt Will Perform Upper Body Dressing: with contact guard assist;sitting Pt Will Perform Lower Body Dressing: with min assist;sitting/lateral leans;sit to/from stand Pt/caregiver will Perform Home Exercise Program: Increased ROM;Increased strength;Both right and left upper extremity;With minimal assist;With written HEP provided Additional ADL Goal #1: pt wil tolerate OOB standing activity x5 min in order to improve activity tolerance for ADLs   OT Frequency:  Min 2X/week    Co-evaluation PT/OT/SLP Co-Evaluation/Treatment: Yes Reason for Co-Treatment: Complexity of the patient's impairments (multi-system involvement);Necessary to address cognition/behavior during functional activity;For patient/therapist safety;To address functional/ADL transfers   OT goals addressed during session: ADL's and self-care;Strengthening/ROM      AM-PAC OT 6 Clicks Daily Activity     Outcome Measure Help from another person eating meals?: A Little Help from another person taking care of personal grooming?: A Little Help from another person toileting, which includes using toliet, bedpan, or urinal?: A Lot Help from another person bathing (including washing, rinsing, drying)?: A Lot Help from another person to put on and taking off regular upper body clothing?: A Lot Help from another person to put on and taking off regular lower body clothing?: A Lot 6 Click Score: 14   End of Session Equipment Utilized During Treatment: Gait belt;Rolling walker (2 wheels) Nurse Communication: Mobility status  Activity Tolerance: Patient tolerated treatment  well Patient left: in chair;with call bell/phone within reach;with chair alarm set  OT Visit Diagnosis: Unsteadiness  on feet (R26.81);Other abnormalities of gait and mobility (R26.89);Muscle weakness (generalized) (M62.81)                Time: 8687-8661 OT Time Calculation (min): 26 min Charges:  OT General Charges $OT Visit: 1 Visit OT Evaluation $OT Eval Moderate Complexity: 1 Mod  Lakyn Alsteen K, OTD, OTR/L SecureChat Preferred Acute Rehab [phone]   Laneta POUR Koonce 12/05/2024, 2:24 PM

## 2024-12-05 NOTE — [place] FL2 (Signed)
 " [place]  MEDICAID FL2 LEVEL OF CARE FORM     IDENTIFICATION  Patient Name: ESTELA VINAL Birthdate: [birth year] Sex: female Admission Date (Current Location): 12/03/2024  [facility] and [place] Number:  Producer, Television/film/video and Address:  The [place]. [facility], [address] [place], [place] [address]      Provider Number: [id number]  Attending Physician Name and Address:  Cherlyn Labella, MD  Relative Name and Phone Number:  Peta, Peachey [phone]    Current Level of Care: SNF Recommended Level of Care: Skilled Nursing Facility Prior Approval Number:    Date Approved/Denied: 12/05/24 PASRR Number: [id number]  Discharge Plan: SNF    Current Diagnoses: Patient Active Problem List   Diagnosis Date Noted   Fall at home, initial encounter 12/03/2024   Anemia 03/31/2024   Spinal stenosis of lumbar region 03/31/2024   Recurrent UTI 03/31/2024   UTI (urinary tract infection) 02/19/2024   Sepsis (HCC) 02/19/2024   Anxiety 11/26/2022   Chronic pain of both knees 12/15/2021   GERD (gastroesophageal reflux disease) 12/14/2021   Insomnia 12/14/2021   Morbid obesity (HCC) 01/13/2021   Recurrent falls 07/05/2020   Body mass index (BMI) 37.0-37.9, adult 04/10/2020   TBI (traumatic brain injury) (HCC) 04/10/2020   Neurocognitive deficits 03/30/2020   Acute subdural hematoma 02/29/2020   Syncope 02/29/2020   Mild renal insufficiency 02/29/2020   Essential hypertension 02/29/2020   Asthma 02/29/2020   Chronic dyspnea 01/04/2020   Allergic rhinitis 01/04/2020   Bilateral foot pain 07/01/2019   Pain in left knee 01/05/2019   Pain in right knee 01/05/2019   Diabetic neuropathy (HCC) 03/04/2018   Obstructive sleep apnea 04/04/2016   Peripheral edema 12/23/2015   Hypersomnolence 12/22/2015   Baker's cyst of knee 11/24/2013   Left knee pain 11/24/2013   Rash 05/26/2012   Bilateral shoulder pain 12/13/2011   Diabetes mellitus type II, non insulin   dependent (HCC) 11/20/2011   Leukocytosis 11/20/2011   Preoperative clearance 11/20/2011   Eustachian tube dysfunction 05/20/2011   Low back pain 05/20/2011   Encounter for well adult exam with abnormal findings 05/20/2011   Vertigo 05/20/2011   Hypothyroidism 02/15/2011   Dysphagia 02/15/2011   URINARY INCONTINENCE 08/23/2009   Seasonal and perennial allergic rhinitis 04/02/2009   Constipation 09/27/2008   Hyperlipidemia 03/11/2008   BUNIONS, BILATERAL 12/23/2007   Depression 09/13/2007   Degenerative arthritis of knee, bilateral 09/13/2007   History of colonic polyps 09/13/2007    Orientation RESPIRATION BLADDER Height & Weight     Self, Time, Situation, Place  Normal Incontinent Weight: 226 lb 4.8 oz (102.6 kg) Height:  5' 6 (167.6 cm)  BEHAVIORAL SYMPTOMS/MOOD NEUROLOGICAL BOWEL NUTRITION STATUS      Continent Diet  AMBULATORY STATUS COMMUNICATION OF NEEDS Skin   Limited Assist Verbally Normal                       Personal Care Assistance Level of Assistance              Functional Limitations Info  Sight Sight Info: Impaired        SPECIAL CARE FACTORS FREQUENCY  PT (By licensed PT), OT (By licensed OT)     PT Frequency: 5x wk OT Frequency: 3x wk            Contractures      Additional Factors Info  Code Status Code Status Info: Full  Current Medications (12/05/2024):  This is the current hospital active medication list Current Facility-Administered Medications  Medication Dose Route Frequency Provider Last Rate Last Admin   0.9 %  sodium chloride  infusion   Intravenous Continuous Akula, Vijaya, MD 50 mL/hr at 12/04/24 2054 Restarted at 12/04/24 2054   acetaminophen  (TYLENOL ) tablet 650 mg  650 mg Oral Q6H PRN Patel, Ekta V, MD   650 mg at 12/05/24 9398   Or   acetaminophen  (TYLENOL ) suppository 650 mg  650 mg Rectal Q6H PRN Patel, Ekta V, MD       ALPRAZolam  (XANAX ) tablet 0.25 mg  0.25 mg Oral BID PRN Duncan, Hazel V, MD    0.25 mg at 12/05/24 0027   aspirin  EC tablet 81 mg  81 mg Oral Daily Patel, Ekta V, MD   81 mg at 12/05/24 0835   atorvastatin  (LIPITOR ) tablet 80 mg  80 mg Oral Daily Patel, Ekta V, MD   80 mg at 12/05/24 9164   citalopram  (CELEXA ) tablet 20 mg  20 mg Oral Daily Akula, Vijaya, MD   20 mg at 12/05/24 0834   enoxaparin  (LOVENOX ) injection 40 mg  40 mg Subcutaneous Q24H Akula, Vijaya, MD   40 mg at 12/04/24 1719   gabapentin  (NEURONTIN ) capsule 100 mg  100 mg Oral TID Patel, Ekta V, MD   100 mg at 12/05/24 0835   hydrALAZINE  (APRESOLINE ) injection 10 mg  10 mg Intravenous Q4H PRN Patel, Ekta V, MD   10 mg at 12/05/24 9660   insulin  aspart (novoLOG ) injection 0-15 Units  0-15 Units Subcutaneous TID WC Tobie Mario GAILS, MD   3 Units at 12/05/24 1158   levothyroxine  (SYNTHROID ) tablet 50 mcg  50 mcg Oral Q0600 Tobie Mario GAILS, MD   50 mcg at 12/05/24 9441   metoprolol  succinate (TOPROL -XL) 24 hr tablet 25 mg  25 mg Oral Daily Patel, Ekta V, MD   25 mg at 12/05/24 0835   pantoprazole  (PROTONIX ) EC tablet 40 mg  40 mg Oral Daily Patel, Ekta V, MD   40 mg at 12/05/24 9164   sodium chloride  flush (NS) 0.9 % injection 3 mL  3 mL Intravenous Q12H Tobie Mario V, MD   3 mL at 12/05/24 1158   traMADol  (ULTRAM ) tablet 50 mg  50 mg Oral Q6H PRN Akula, Vijaya, MD   50 mg at 12/05/24 1046   traZODone  (DESYREL ) tablet 50 mg  50 mg Oral QHS Patel, Ekta V, MD   50 mg at 12/04/24 2103     Discharge Medications: Please see discharge summary for a list of discharge medications.  Relevant Imaging Results:  Relevant Lab Results:   Additional Information SSN: [id number]  Darin JULIANNA Bend, LCSW     "

## 2024-12-05 NOTE — Plan of Care (Incomplete)
   Problem: Coping: Goal: Ability to adjust to condition or change in health will improve Outcome: Progressing

## 2024-12-05 NOTE — Progress Notes (Signed)
Placed patient on CPAP for the night via auto-mode.  

## 2024-12-05 NOTE — Progress Notes (Signed)
 Physical Therapy Treatment Patient Details Name: Alexandra Wolfe MRN: [id number] DOB: 06/01/1946 Today's Date: 12/05/2024   History of Present Illness 78 y.o. female presents to [facility] 12/03/24 after falling at home. Imaging negative for acute fxs. PMHx: T2DM with neuropathy, morbid obesity, asthma, urinary incontinence, SDH/TBI/neurocognitive deficit, osteoarthritis, lumbar laminectomy, hypothyroidism, anxiety, depression and GERD    PT Comments  Pt admitted with above diagnosis. Pt was able to come to sitting with min assist today using rails and with difficulty using momentum. Pt with posterior lean once sitting EOB needing assist. Pt needing mod assist of 2 for sit to stand and transfer with rW. Discussed need for post acute rehab with pt < 3 hours day and pt intiially stating she will be ok going home. Discussed the fact that she can't walk currently and lives alone and pt states that she is open to therapy if she needs it.  Overall pt with poor postural control. Of note, MD asked for vestibular evaluation. Pt negative for BPPV and was positive for left hypofunction. Initiated x 1 exercises and pt performed x 3 and instructed to do 3x 5 x day.  Pt currently with functional limitations due to the deficits listed below (see PT Problem List). Pt will benefit from acute skilled PT to increase their independence and safety with mobility to allow discharge.       If plan is discharge home, recommend the following: A lot of help with walking and/or transfers;A lot of help with bathing/dressing/bathroom;Assist for transportation;Help with stairs or ramp for entrance   Can travel by private vehicle     No  Equipment Recommendations  Rolling walker (2 wheels)    Recommendations for Other Services       Precautions / Restrictions Precautions Precautions: Fall Recall of Precautions/Restrictions: Intact Restrictions Weight Bearing Restrictions Per Provider Order: No     Mobility  Bed  Mobility Overal bed mobility: Needs Assistance Bed Mobility: Rolling, Sidelying to Sit, Sit to Supine Rolling: Used rails, Min assist Sidelying to sit: Min assist       General bed mobility comments: from flat/slightly elevated bed wtih pt using momentum and rails to come to EOB with incr time.    Transfers Overall transfer level: Needs assistance Equipment used: Rolling walker (2 wheels) Transfers: Sit to/from Stand, Bed to chair/wheelchair/BSC Sit to Stand: Mod assist, +2 physical assistance           General transfer comment: Needed mod assist and cues for sit to stand with pt taking several attempts to come to stand and then needing assist and cues to stand tall.  Pt took pivotal steps to recliner with min assist and mod cues for sequencing steps and RW with pt taking incr time and fatiguing quickly with DOE 3/4. Pt with flexed posture that worsened as she pivoted.  Needing controlled descent into chair.    Ambulation/Gait                   Stairs             Wheelchair Mobility     Tilt Bed    Modified Rankin (Stroke Patients Only)       Balance Overall balance assessment: Needs assistance, Mild deficits observed, not formally tested, History of Falls Sitting-balance support: Feet supported, No upper extremity supported, Bilateral upper extremity supported Sitting balance-Leahy Scale: Poor Sitting balance - Comments: intermittent ModA for posterior lean with dynamic activities Postural control: Posterior lean Standing balance support: Bilateral upper extremity  supported, During functional activity, Reliant on assistive device for balance Standing balance-Leahy Scale: Poor Standing balance comment: reliant on BUE and external support                            Communication Communication Communication: No apparent difficulties  Cognition Arousal: Alert Behavior During Therapy: WFL for tasks assessed/performed   PT - Cognitive  impairments: No apparent impairments                         Following commands: Intact      Cueing Cueing Techniques: Verbal cues, Tactile cues  Exercises Other Exercises Other Exercises: intiiated x 1 exercises.    General Comments General comments (skin integrity, edema, etc.): VSS on RA aothough pt was DOE 3/4 with little activity.  Tested pt for BPPV and negative testing all canals. Pt was positive for left vestibular hypofunction and intiiated x 1 exercises.      Pertinent Vitals/Pain Pain Assessment Pain Assessment: No/denies pain Faces Pain Scale: Hurts little more Pain Location: all over Pain Descriptors / Indicators: Aching, Discomfort Pain Intervention(s): Limited activity within patient's tolerance, Monitored during session, Repositioned    Home Living Family/patient expects to be discharged to:: Private residence Living Arrangements: Alone Available Help at Discharge: Family;Available PRN/intermittently (reports son will be there when she calls him) Type of Home: Apartment Home Access: Level entry       Home Layout: One level Home Equipment: Rollator (4 wheels);Wheelchair - manual;Shower seat;BSC/3in1;Lift chair Additional Comments: patient stating she sleeps in the bed, and will nap in the recliner during the day.  Has a life alert    Prior Function            PT Goals (current goals can now be found in the care plan section) Acute Rehab PT Goals Patient Stated Goal: to get stronger Progress towards PT goals: Progressing toward goals    Frequency    Min 2X/week      PT Plan      Co-evaluation PT/OT/SLP Co-Evaluation/Treatment: Yes Reason for Co-Treatment: Complexity of the patient's impairments (multi-system involvement);Necessary to address cognition/behavior during functional activity;For patient/therapist safety;To address functional/ADL transfers PT goals addressed during session: Mobility/safety with mobility OT goals  addressed during session: ADL's and self-care;Strengthening/ROM      AM-PAC PT 6 Clicks Mobility   Outcome Measure  Help needed turning from your back to your side while in a flat bed without using bedrails?: A Little Help needed moving from lying on your back to sitting on the side of a flat bed without using bedrails?: A Little Help needed moving to and from a bed to a chair (including a wheelchair)?: A Lot Help needed standing up from a chair using your arms (e.g., wheelchair or bedside chair)?: A Lot Help needed to walk in hospital room?: Total Help needed climbing 3-5 steps with a railing? : Total 6 Click Score: 12    End of Session Equipment Utilized During Treatment: Gait belt Activity Tolerance: Patient limited by fatigue Patient left: with call bell/phone within reach;in chair;with chair alarm set Nurse Communication: Mobility status PT Visit Diagnosis: Unsteadiness on feet (R26.81);Other abnormalities of gait and mobility (R26.89);Muscle weakness (generalized) (M62.81);History of falling (Z91.81)     Time: 8741-8661 PT Time Calculation (min) (ACUTE ONLY): 40 min  Charges:    $Therapeutic Exercise: 8-22 mins $Therapeutic Activity: 8-22 mins $Self Care/Home Management: 8-22 PT General Charges $$ ACUTE PT  VISIT: 1 Visit                     Arra Connaughton M,PT Acute Rehab Services [phone]    Alexandra Wolfe 12/05/2024, 4:40 PM

## 2024-12-05 NOTE — TOC Initial Note (Addendum)
 Transition of Care [facility]) - Initial/Assessment Note   Patient Details  Name: Alexandra Wolfe MRN: [id number] Date of Birth: 1946/04/02  [facility] CM/SW Contact:    Darin JULIANNA Bend, LCSW Phone Number: 12/05/2024, 12:52 PM  Clinical Narrative:                 CSW followed-up disposition recommendations (SNF placement).  CSW completed initial TOC work-up/assessment as noted by the following below.   CSW spoke with the patient to review SNF referral process per clinical recommendations and assessed the pt's SNF preference.   The patient expressed no preference  CSW updated the bedside nurse and additional clinical members o the information above regarding SNF initial efforts for SNF placement.    CSW referral efforts to support the patient's disposition FL2:  PASRR:  SNF referrals  TOC Disposition follow-up needs  Please provide the patient or natural support with bed-offer updates.  Please continue with SNF placement efforts. Please update the clinical team to SNF placement efforts:  No other needs identified by this clinical research associate currently. Patient needs and current disposition to be followed by    Expected Discharge Plan: Skilled Nursing Facility Barriers to Discharge: Continued Medical Work up   Patient Goals and CMS Choice Patient states their goals for this hospitalization and ongoing recovery are:: To get better   Choice offered to / list presented to : Patient, Spouse      Expected Discharge Plan and Services In-house Referral: Clinical Social Work     Living arrangements for the past 2 months: Single Family Home                                      Prior Living Arrangements/Services Living arrangements for the past 2 months: Single Family Home Lives with:: Spouse Patient language and need for interpreter reviewed:: No        Need for Family Participation in Patient Care: Yes (Comment) Care giver support system in place?: Yes (comment)    Criminal Activity/Legal Involvement Pertinent to Current Situation/Hospitalization: No - Comment as needed  Activities of Daily Living      Permission Sought/Granted Permission sought to share information with : Case Manager, Magazine Features Editor, Family Supports Permission granted to share information with : Yes, Verbal Permission Granted  Share Information with NAME: Kynslee, Baham     Permission granted to share info w Relationship: Spouse Goodlow,[name]     Emotional Assessment       Orientation: : Oriented to Self, Oriented to Place, Oriented to  Time, Oriented to Situation      Admission diagnosis:  Fall at home, initial encounter [[email or web address], Y92.009] Patient Active Problem List   Diagnosis Date Noted   Fall at home, initial encounter 12/03/2024   Anemia 03/31/2024   Spinal stenosis of lumbar region 03/31/2024   Recurrent UTI 03/31/2024   UTI (urinary tract infection) 02/19/2024   Sepsis (HCC) 02/19/2024   Anxiety 11/26/2022   Chronic pain of both knees 12/15/2021   GERD (gastroesophageal reflux disease) 12/14/2021   Insomnia 12/14/2021   Morbid obesity (HCC) 01/13/2021   Recurrent falls 07/05/2020   Body mass index (BMI) 37.0-37.9, adult 04/10/2020   TBI (traumatic brain injury) (HCC) 04/10/2020   Neurocognitive deficits 03/30/2020   Acute subdural hematoma 02/29/2020   Syncope 02/29/2020   Mild renal insufficiency 02/29/2020   Essential hypertension 02/29/2020   Asthma 02/29/2020   Chronic  dyspnea 01/04/2020   Allergic rhinitis 01/04/2020   Bilateral foot pain 07/01/2019   Pain in left knee 01/05/2019   Pain in right knee 01/05/2019   Diabetic neuropathy (HCC) 03/04/2018   Obstructive sleep apnea 04/04/2016   Peripheral edema 12/23/2015   Hypersomnolence 12/22/2015   Baker's cyst of knee 11/24/2013   Left knee pain 11/24/2013   Rash 05/26/2012   Bilateral shoulder pain 12/13/2011   Diabetes mellitus type II, non insulin  dependent (HCC)  11/20/2011   Leukocytosis 11/20/2011   Preoperative clearance 11/20/2011   Eustachian tube dysfunction 05/20/2011   Low back pain 05/20/2011   Encounter for well adult exam with abnormal findings 05/20/2011   Vertigo 05/20/2011   Hypothyroidism 02/15/2011   Dysphagia 02/15/2011   URINARY INCONTINENCE 08/23/2009   Seasonal and perennial allergic rhinitis 04/02/2009   Constipation 09/27/2008   Hyperlipidemia 03/11/2008   BUNIONS, BILATERAL 12/23/2007   Depression 09/13/2007   Degenerative arthritis of knee, bilateral 09/13/2007   History of colonic polyps 09/13/2007   PCP:  Pcp, No Pharmacy:   [id number] [place], [place] - [address] [place] [address] Phone: [phone] Fax: [phone]     Social Drivers of Health (SDOH) Social History: SDOH Screenings   Food Insecurity: No Food Insecurity (12/03/2024)  Housing: Low Risk (12/03/2024)  Transportation Needs: No Transportation Needs (12/03/2024)  Utilities: Not At Risk (12/03/2024)  Alcohol Screen: Low Risk (06/25/2023)  Depression (PHQ2-9): Low Risk (11/18/2024)  Financial Resource Strain: Low Risk (04/02/2024)  Physical Activity: Inactive (11/18/2024)  Social Connections: Socially Isolated (12/03/2024)  Stress: Stress Concern Present (11/18/2024)  Tobacco Use: Medium Risk (11/18/2024)  Health Literacy: Adequate Health Literacy (11/18/2024)   SDOH Interventions:     Readmission Risk Interventions     No data to display

## 2024-12-05 NOTE — Progress Notes (Signed)
 "        [facility]                                                                               Davis Vannatter, is a 78 y.o. female, DOB - [birth year], FMW:[id number] Admit date - 12/03/2024    Outpatient Primary MD for the patient is Pcp, No  LOS - 0  days    Brief summary   Alexandra Wolfe is a 78 y.o. female with past medical history  of  T2DM with neuropathy, morbid obesity, asthma, urinary incontinence, SDH/TBI/neurocognitive deficit, osteoarthritis, lumbar laminectomy, hypothyroidism, anxiety, depression and GERD presenting with recurrent falls, dysuria and suprapubic pain coming in for falls and loss of balance. Patient lives at home and independent.   Assessment & Plan    Assessment and Plan:   Recurrent falls from increased debility She lives alone and has had recurrent falls as per the son.  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  She reports falling due to intermittent vertigo and her knees giving out.  So far no source of infection found. She is probably dehydrated as her lactic acid was elevated.  She was started on IV fluids with improvement in lactic acid.  She denies any new complaints.  MRI brain without contrast is negative for acute stroke.  Therapy eval recommending SNF. Patient agreeable and wants [facility].      Type 2 DM with hyperglycemia CBG (last 3)  Recent Labs    12/04/24 1557 12/04/24 2149 12/05/24 0744  GLUCAP 108* 136* 182*   Resume SSI.  A1c is 7.    Lactic acidosis Resolved with IV fluids.    Obesity Body mass index is 36.53 kg/m.    GERD Stable.    Anxiety and depression Resume home meds.    Hypothyroidism Resume synthroid .    Hyperlipidemia:  Resume lipitor .       Estimated body mass index is 36.53 kg/m as calculated from the following:   Height as of this encounter: 5' 6 (1.676 m).   Weight as of this encounter: 102.6 kg.  Code Status: full  code.  DVT Prophylaxis:  enoxaparin  (LOVENOX ) injection 40 mg Start: 12/04/24 1700 SCDs Start: 12/03/24 1442   Level of Care: Level of care: Telemetry Family Communication: discussed the plan with her son at bedside.   Disposition Plan:     Remains inpatient appropriate:  pending clinical improvement and possibly to SNF when bed available.    Procedures:  None   Consultants:   None.   Antimicrobials:   Anti-infectives (From admission, onward)    None        Medications  Scheduled Meds:  aspirin  EC  81 mg Oral Daily   atorvastatin   80 mg Oral Daily   citalopram   20 mg Oral Daily   enoxaparin  (LOVENOX ) injection  40 mg Subcutaneous Q24H   gabapentin   100 mg Oral TID   insulin  aspart  0-15 Units Subcutaneous TID WC   levothyroxine   50 mcg Oral Q0600   metoprolol  succinate  25 mg Oral Daily   pantoprazole   40 mg Oral Daily   sodium  chloride flush  3 mL Intravenous Q12H   traZODone   50 mg Oral QHS   Continuous Infusions:  sodium chloride  50 mL/hr at 12/04/24 2054   PRN Meds:.acetaminophen  **OR** acetaminophen , ALPRAZolam , hydrALAZINE , traMADol     Subjective:   Alexandra Wolfe was seen and examined today.   NO NEW COMPLAINTS.   Objective:   Vitals:   12/04/24 2318 12/05/24 0000 12/05/24 0334 12/05/24 0736  BP: (!) 163/68 (!) 154/63 (!) 165/64 (!) 157/73  Pulse: 90 91 87 98  Resp: 20  20 19   Temp: 98 F (36.7 C)  98.1 F (36.7 C) 98 F (36.7 C)  TempSrc:   Oral Oral  SpO2: 95%  97% 97%  Weight:      Height:        Intake/Output Summary (Last 24 hours) at 12/05/2024 1000 Last data filed at 12/05/2024 0600 Gross per 24 hour  Intake --  Output 3325 ml  Net -3325 ml   Filed Weights   12/03/24 1104 12/04/24 0601  Weight: 99.8 kg 102.6 kg     Exam General exam: Appears calm and comfortable  Respiratory system: Clear to auscultation. Respiratory effort normal. Cardiovascular system: S1 & S2 heard, RRR.  Gastrointestinal system: Abdomen is  nondistended, soft and nontender.  Central nervous system: Alert and oriented.  Extremities: Symmetric 5 x 5 power. Skin: No rashes, lesions or ulcers Psychiatry: Mood & affect appropriate.      Data Reviewed:  I have personally reviewed following labs and imaging studies   CBC Lab Results  Component Value Date   WBC 11.3 (H) 12/04/2024   RBC 4.72 12/04/2024   HGB 12.9 12/04/2024   HCT 41.3 12/04/2024   MCV 87.5 12/04/2024   MCH 27.3 12/04/2024   PLT 287 12/04/2024   MCHC 31.2 12/04/2024   RDW 15.8 (H) 12/04/2024   LYMPHSABS 3.8 09/25/2024   MONOABS 0.8 09/25/2024   EOSABS 0.1 09/25/2024   BASOSABS 0.1 09/25/2024     Last metabolic panel Lab Results  Component Value Date   NA 137 12/04/2024   K 4.2 12/04/2024   CL 103 12/04/2024   CO2 23 12/04/2024   BUN 14 12/04/2024   CREATININE 0.71 12/04/2024   GLUCOSE 158 (H) 12/04/2024   GFRNONAA >60 12/04/2024   GFRAA >60 05/09/2020   CALCIUM  9.3 12/04/2024   PHOS 4.2 02/21/2024   PROT 6.7 12/04/2024   ALBUMIN 4.3 12/04/2024   BILITOT 0.5 12/04/2024   ALKPHOS 83 12/04/2024   AST 57 (H) 12/04/2024   ALT 30 12/04/2024   ANIONGAP 11 12/04/2024    CBG (last 3)  Recent Labs    12/04/24 1557 12/04/24 2149 12/05/24 0744  GLUCAP 108* 136* 182*      Coagulation Profile: Recent Labs  Lab 12/03/24 1130  INR 1.1     Radiology Studies: MR BRAIN WO CONTRAST Result Date: 12/05/2024 EXAM: MRI BRAIN WITHOUT CONTRAST 12/04/2024 08:08:25 PM TECHNIQUE: Multiplanar multisequence MRI of the head/brain was performed without the administration of intravenous contrast. COMPARISON: MR Head without contrast 03/14/2020. CLINICAL HISTORY: recurrent falls. FINDINGS: BRAIN AND VENTRICLES: No acute infarct. No intracranial hemorrhage. No mass. No midline shift. Similar ventriculomegaly, likely due to cerebral atrophy. Similar patchy T2 hyperintensity of the white matter, nonspecific but compatible with chronic microvascular ischemic  change. Normal flow voids. ORBITS: No acute abnormality. SINUSES AND MASTOIDS: No acute abnormality. BONES AND SOFT TISSUES: Normal marrow signal. No acute soft tissue abnormality. IMPRESSION: 1. No acute intracranial abnormality. 2. Similar chronic findings as detailed  above. Electronically signed by: Gilmore Molt 12/05/2024 02:21 AM EST RP Workstation: [id number]   DG Knee Complete 4 Views Right Result Date: 12/03/2024 CLINICAL DATA:  Bilateral knee injuries after fall. EXAM: RIGHT KNEE - COMPLETE 4+ VIEW COMPARISON:  None Available. FINDINGS: No evidence of fracture, dislocation, or joint effusion. Moderate narrowing of lateral joint space is noted with osteophyte formation. Chondrocalcinosis is noted medially. Soft tissues are unremarkable. 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 No acute abnormality seen. Electronically Signed   By: Lynwood Landy Raddle M.D.   On: 12/03/2024 14:04   DG Knee Complete 4 Views Left Result Date: 12/03/2024 CLINICAL DATA:  Bilateral knee abrasions after fall EXAM: LEFT KNEE - COMPLETE 4+ VIEW COMPARISON:  None Available. FINDINGS: No evidence of fracture, dislocation, or joint effusion. Moderate to severe narrowing of lateral joint space is noted with osteophyte formation. Chondrocalcinosis is noted medially. Soft tissues are unremarkable. IMPRESSION: Moderate to severe degenerative joint disease is noted laterally. No acute abnormality seen. Electronically Signed   By: Lynwood Landy Raddle M.D.   On: 12/03/2024 14:03   CT CHEST ABDOMEN PELVIS W CONTRAST Result Date: 12/03/2024 EXAM: CT CHEST, ABDOMEN AND PELVIS WITH CONTRAST 12/03/2024 12:28:00 PM TECHNIQUE: CT of the chest, abdomen and pelvis was performed with the administration of 75 mL of iohexol  (OMNIPAQUE ) 350 MG/ML injection. Multiplanar reformatted images are provided for review. Automated exposure control, iterative reconstruction, and/or weight based adjustment of the mA/kV was utilized to  reduce the radiation dose to as low as reasonably achievable. COMPARISON: CT chest, abdomen, and pelvis 09/25/2024. CLINICAL HISTORY: 78 year old female with polytrauma, blunt, fall, left side injury. FINDINGS: CHEST: MEDIASTINUM AND LYMPH NODES: Heart and pericardium are unremarkable. Normal heart size. No pericardial effusion. The central airways are clear. No mediastinal, hilar or axillary lymphadenopathy. LUNGS AND PLEURA: Lung volumes and major airways remain normal. No focal consolidation or pulmonary edema. No pleural effusion or pneumothorax. ABDOMEN AND PELVIS: LIVER: Stable liver enhancement. GALLBLADDER AND BILE DUCTS: Status post cholecystectomy. No biliary ductal dilatation. SPLEEN: No acute abnormality. PANCREAS: No acute abnormality. ADRENAL GLANDS: No acute abnormality. KIDNEYS, URETERS AND BLADDER: Symmetric and normal renal contrast excretion. Diminutive ureters. No stones in the kidneys or ureters. No hydronephrosis. No perinephric or periureteral stranding. Distended urinary bladder, estimated volume 601 mL. GI AND BOWEL: Stomach is decompressed. Redundant large bowel. Mild retained stool. Nondilated bowel loops. There is no bowel obstruction. REPRODUCTIVE ORGANS: No acute abnormality. PERITONEUM AND RETROPERITONEUM: Stable small fat containing umbilical hernia. No ascites. No free air. VASCULATURE: Calcified coronary artery and aortic atherosclerosis. Aorta is normal in caliber. Major arterial structures and portal venous system appear to be patent. ABDOMINAL AND PELVIS LYMPH NODES: No lymphadenopathy. BONES AND SOFT TISSUES: Normal thoracic and lumbar segmentation. Widespread advanced spinal degeneration appears stable since 09/25/2024. 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 Chronic severe bilateral glenohumeral degeneration. No acute osseous abnormality. Stable mild breast tissue asymmetry. No superficial soft tissue injury identified.  IMPRESSION: 1. No acute traumatic injury identified in the chest, abdomen, and pelvis. 2. Distended urinary bladder (estimated 601 mL). Query urinary retention. 3. Advanced chronic spinal degeneration. Electronically signed by: Helayne Hurst MD 12/03/2024 12:46 PM EST RP Workstation: [id number]   CT CERVICAL SPINE WO CONTRAST Result Date: 12/03/2024 EXAM: CT CERVICAL SPINE WITHOUT CONTRAST 12/03/2024 12:28:00 PM TECHNIQUE: CT of the cervical spine was performed without the administration of intravenous contrast. Multiplanar reformatted images are provided for review. Automated exposure  control, iterative reconstruction, and/or weight based adjustment of the mA/kV was utilized to reduce the radiation dose to as low as reasonably achievable. COMPARISON: Cervical spine CT 09/25/2024. CLINICAL HISTORY: 78 year old female. Polytrauma, blunt. Fall, left side injury. FINDINGS: BONES AND ALIGNMENT: Mildly improved cervical lordosis. No acute fracture or traumatic malalignment. DEGENERATIVE CHANGES: Chronic C1-C2 degeneration. Advanced chronic cervical facet arthropathy on the right including vacuum facet at C2-C3 through C4-C5. There does appear to be chronic degenerative facet ankylosis at the cervicothoracic junction. No other convincing facet ankylosis. Chronic severe lower cervical disc and endplate degeneration at C5-C6 and C6-C7, with evidence of developing interbody ankylosis at the latter. Chronic cervical spine degeneration appears stable by CT. SOFT TISSUES: No prevertebral soft tissue swelling. Negative visible non-contrast thoracic inlet. Calcified left ICA origin atherosclerosis. Otherwise negative visible non-contrast neck soft tissues. IMPRESSION: 1. No acute traumatic injury identified in the cervical spine. 2. Stable chronic cervical spine degeneration. Electronically signed by: Helayne Hurst MD 12/03/2024 12:37 PM EST RP Workstation: [id number]   CT HEAD WO CONTRAST Result Date: 12/03/2024 EXAM: CT  HEAD WITHOUT CONTRAST 12/03/2024 12:28:00 PM TECHNIQUE: CT of the head was performed without the administration of intravenous contrast. Automated exposure control, iterative reconstruction, and/or weight based adjustment of the mA/kV was utilized to reduce the radiation dose to as low as reasonably achievable. COMPARISON: Brain MRI 05/09/2020, Head CT 09/25/2024. CLINICAL HISTORY: 78 year old female. History of moderate-severe head trauma, fall, and left side injury. FINDINGS: BRAIN AND VENTRICLES: No acute hemorrhage. No evidence of acute infarct. No hydrocephalus. No extra-axial collection. No mass effect or midline shift. Brain volume stable, within normal limits for age. Basal ganglia vascular calcifications incidentally noted and stable. Stable gray white differentiation. Mild for age periventricular white matter hypodensity. No suspicious intracranial vascular hyperdensity. ORBITS: No acute abnormality. SINUSES: Paranasal sinuses, tympanic cavities and mastoids are well aerated. SOFT TISSUES AND SKULL: No acute soft tissue abnormality. No skull fracture. Hyperostosis of the calvarium, normal variant. Calcified atherosclerosis at the skull base. IMPRESSION: 1. No acute traumatic injury identified. 2. Stable mild for age chronic white matter disease. Electronically signed by: Helayne Hurst MD 12/03/2024 12:34 PM EST RP Workstation: [id number]       Elgie Butter M.D. [facility] 12/05/2024, 10:00 AM  Available via Epic secure chat 7am-7pm After 7 pm, please refer to night coverage provider listed on amion.    "

## 2024-12-06 ENCOUNTER — Encounter (HOSPITAL_COMMUNITY): Payer: Self-pay | Admitting: Internal Medicine

## 2024-12-06 DIAGNOSIS — R7989 Other specified abnormal findings of blood chemistry: Secondary | ICD-10-CM | POA: Diagnosis not present

## 2024-12-06 DIAGNOSIS — E039 Hypothyroidism, unspecified: Secondary | ICD-10-CM | POA: Diagnosis not present

## 2024-12-06 DIAGNOSIS — E119 Type 2 diabetes mellitus without complications: Secondary | ICD-10-CM | POA: Diagnosis not present

## 2024-12-06 DIAGNOSIS — Z7982 Long term (current) use of aspirin: Secondary | ICD-10-CM | POA: Diagnosis not present

## 2024-12-06 DIAGNOSIS — G4733 Obstructive sleep apnea (adult) (pediatric): Secondary | ICD-10-CM | POA: Diagnosis not present

## 2024-12-06 DIAGNOSIS — K219 Gastro-esophageal reflux disease without esophagitis: Secondary | ICD-10-CM | POA: Diagnosis not present

## 2024-12-06 DIAGNOSIS — F411 Generalized anxiety disorder: Secondary | ICD-10-CM | POA: Diagnosis not present

## 2024-12-06 DIAGNOSIS — S8001XA Contusion of right knee, initial encounter: Secondary | ICD-10-CM | POA: Diagnosis not present

## 2024-12-06 DIAGNOSIS — S0990XA Unspecified injury of head, initial encounter: Secondary | ICD-10-CM | POA: Diagnosis not present

## 2024-12-06 DIAGNOSIS — R339 Retention of urine, unspecified: Secondary | ICD-10-CM | POA: Diagnosis not present

## 2024-12-06 DIAGNOSIS — S8002XA Contusion of left knee, initial encounter: Secondary | ICD-10-CM | POA: Diagnosis not present

## 2024-12-06 DIAGNOSIS — M51369 Other intervertebral disc degeneration, lumbar region without mention of lumbar back pain or lower extremity pain: Secondary | ICD-10-CM | POA: Diagnosis not present

## 2024-12-06 LAB — GLUCOSE, CAPILLARY
Glucose-Capillary: 163 mg/dL — ABNORMAL HIGH (ref 70–99)
Glucose-Capillary: 161 mg/dL — ABNORMAL HIGH (ref 70–99)
Glucose-Capillary: 162 mg/dL — ABNORMAL HIGH (ref 70–99)
Glucose-Capillary: 168 mg/dL — ABNORMAL HIGH (ref 70–99)

## 2024-12-06 NOTE — Progress Notes (Signed)
 Physical Therapy Treatment Patient Details Name: Alexandra Wolfe MRN: [id number] DOB: 07/24/1946 Today's Date: 12/06/2024   History of Present Illness 78 y.o. female [facility] 12/03/24 after falling at home. Imaging negative for acute fxs. PMHx: T2DM with neuropathy, morbid obesity, asthma, urinary incontinence, SDH/TBI/neurocognitive deficit, osteoarthritis, lumbar laminectomy, hypothyroidism, anxiety, depression and GERD    PT Comments  Tolerated treatment and activity progression well. CGA for bed mobility, mod assist to stand, pre-gait and formal gait training at min assist level for balance and RW control. Still with early tendency for posterior lean but improved with training techniques. X1 view reviewed, horizontal and vertical (more saccadic with vertical challenge.) Patient will continue to benefit from skilled physical therapy services to further improve independence with functional mobility. Patient will benefit from continued inpatient follow up therapy, <3 hours/day     If plan is discharge home, recommend the following: A lot of help with walking and/or transfers;A lot of help with bathing/dressing/bathroom;Assist for transportation;Help with stairs or ramp for entrance   Can travel by private vehicle     No  Equipment Recommendations  Rolling walker (2 wheels)    Recommendations for Other Services       Precautions / Restrictions Precautions Precautions: Fall Recall of Precautions/Restrictions: Intact Restrictions Weight Bearing Restrictions Per Provider Order: No     Mobility  Bed Mobility Overal bed mobility: Needs Assistance Bed Mobility: Supine to Sit     Supine to sit: Contact guard     General bed mobility comments: CGA for safety, extra time, using rail once stead due to posterior lean.    Transfers Overall transfer level: Needs assistance Equipment used: Rolling walker (2 wheels) Transfers: Sit to/from Stand Sit to Stand: Mod assist, Via  lift equipment           General transfer comment: Mod assist for boost to stand x2 from elevated bed surface. Education for set-up (foot and hand placement) to maximize leverage and stability with transitions.    Ambulation/Gait Ambulation/Gait assistance: Min assist Gait Distance (Feet): 35 Feet Assistive device: Rolling walker (2 wheels) Gait Pattern/deviations: Step-to pattern, Decreased stride length, Shuffle, Step-through pattern, Leaning posteriorly Gait velocity: decr Gait velocity interpretation: <1.31 ft/sec, indicative of household ambulator Pre-gait activities: Weight shift, a/p rocking, static march. BIL hands on RW, min assist for balance. General Gait Details: Min assist for balance and RW control. Progressed gradually from step-to to intermittent step through patterning with cues for compensatory techniques.  Poor advancement of LLE without increased effort and focus. Reports bil knee deficits which is likely contributing but appears to have limited ankle ROM as well hindering smooth transitions. Some tendendcy for posterior lean but overall does well early on with leaning forward onto RW for adequate support.   Stairs             Wheelchair Mobility     Tilt Bed    Modified Rankin (Stroke Patients Only)       Balance Overall balance assessment: Needs assistance, Mild deficits observed, not formally tested, History of Falls Sitting-balance support: Feet supported, No upper extremity supported, Single extremity supported Sitting balance-Leahy Scale: Poor Sitting balance - Comments: Progresssed from CGA to Supervision without UE support. Postural control: Posterior lean Standing balance support: Bilateral upper extremity supported, During functional activity, Reliant on assistive device for balance Standing balance-Leahy Scale: Poor Standing balance comment: reliant on BUE and external support  Communication  Communication Communication: No apparent difficulties  Cognition Arousal: Alert Behavior During Therapy: WFL for tasks assessed/performed   PT - Cognitive impairments: No apparent impairments                         Following commands: Intact      Cueing Cueing Techniques: Verbal cues, Tactile cues, Gestural cues  Exercises General Exercises - Lower Extremity Ankle Circles/Pumps: AROM, Both, 10 reps, Seated, Standing Quad Sets: Strengthening, Both, 10 reps, Seated Gluteal Sets: Strengthening, Both, 10 reps, Seated Long Arc Quad: Strengthening, Both, 5 reps, Seated Other Exercises Other Exercises: X1 view horizontal and vertical (1 min ea) Instructed to perform 5x/day    General Comments        Pertinent Vitals/Pain Pain Assessment Pain Assessment: No/denies pain    Home Living                          Prior Function            PT Goals (current goals can now be found in the care plan section) Acute Rehab PT Goals Patient Stated Goal: to get stronger PT Goal Formulation: With patient Time For Goal Achievement: 12/18/24 Potential to Achieve Goals: Good Progress towards PT goals: Progressing toward goals    Frequency    Min 2X/week      PT Plan      Co-evaluation              AM-PAC PT 6 Clicks Mobility   Outcome Measure  Help needed turning from your back to your side while in a flat bed without using bedrails?: A Little Help needed moving from lying on your back to sitting on the side of a flat bed without using bedrails?: A Little Help needed moving to and from a bed to a chair (including a wheelchair)?: A Lot Help needed standing up from a chair using your arms (e.g., wheelchair or bedside chair)?: A Lot Help needed to walk in hospital room?: A Lot Help needed climbing 3-5 steps with a railing? : Total 6 Click Score: 13    End of Session Equipment Utilized During Treatment: Gait belt Activity Tolerance: Patient  tolerated treatment well Patient left: with call bell/phone within reach;in chair;with chair alarm set   PT Visit Diagnosis: Unsteadiness on feet (R26.81);Other abnormalities of gait and mobility (R26.89);Muscle weakness (generalized) (M62.81);History of falling (Z91.81);Difficulty in walking, not elsewhere classified (R26.2)     Time: 8983-8956 PT Time Calculation (min) (ACUTE ONLY): 27 min  Charges:    $Gait Training: 8-22 mins $Therapeutic Activity: 8-22 mins PT General Charges $$ ACUTE PT VISIT: 1 Visit                     Alexandra Wolfe, PT, DPT [facility]  Rehabilitation Services Physical Therapist Office: [phone] Website: [email or web address]    Alexandra Wolfe 12/06/2024, 11:12 AM

## 2024-12-06 NOTE — TOC Progression Note (Addendum)
 Transition of Care [facility]) - Progression Note    Patient Details  Name: Alexandra Wolfe MRN: [id number] Date of Birth: 22-Aug-1946  [facility] CM/SW Contact  Sherline Clack, [place] Phone Number: 12/06/2024, 1:46 PM  Clinical Narrative:     CSW reached out to patient's son via phone call to to discuss anticipated SNF placement. Family expressed preference for [facility], which offered a bed for patient. Patient's son requested CSW accept bed offer and start insurance auth. CSW accepted facility in the [place] and submitted insurance authorization, auth ID: [id number]. Auth currently pending. CSW will continue to follow.   Expected Discharge Plan: Skilled Nursing Facility Barriers to Discharge: Continued Medical Work up               Expected Discharge Plan and Services In-house Referral: Clinical Social Work     Living arrangements for the past 2 months: Single Family Home                                       Social Drivers of Health (SDOH) Interventions SDOH Screenings   Food Insecurity: No Food Insecurity (12/03/2024)  Housing: Low Risk (12/03/2024)  Transportation Needs: No Transportation Needs (12/03/2024)  Utilities: Not At Risk (12/03/2024)  Alcohol Screen: Low Risk (06/25/2023)  Depression (PHQ2-9): Low Risk (11/18/2024)  Financial Resource Strain: Low Risk (04/02/2024)  Physical Activity: Inactive (11/18/2024)  Social Connections: Socially Isolated (12/03/2024)  Stress: Stress Concern Present (11/18/2024)  Tobacco Use: Medium Risk (12/06/2024)  Health Literacy: Adequate Health Literacy (11/18/2024)    Readmission Risk Interventions     No data to display

## 2024-12-06 NOTE — Progress Notes (Signed)
 "        [facility]                                                                               Shalinda Burkholder, is a 78 y.o. female, DOB - 1946-11-12, FMW:[id number] Admit date - 12/03/2024    Outpatient Primary MD for the patient is Pcp, No  LOS - 0  days    Brief summary   Alexandra Wolfe is a 78 y.o. female with past medical history  of  T2DM with neuropathy, morbid obesity, asthma, urinary incontinence, SDH/TBI/neurocognitive deficit, osteoarthritis, lumbar laminectomy, hypothyroidism, anxiety, depression and GERD presenting with recurrent falls, dysuria and suprapubic pain coming in for falls and loss of balance. Patient lives at home and independent.   Assessment & Plan    Assessment and Plan:   Recurrent falls from increased debility She lives alone and has had recurrent falls as per the son.  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  She reports falling due to intermittent vertigo and her knees giving out.  So far no source of infection found. She is probably dehydrated as her lactic acid was elevated.  She was started on IV fluids with improvement in lactic acid.  She denies any new complaints.  MRI brain without contrast is negative for acute stroke.  Therapy eval recommending SNF. Patient agreeable and wants [facility].  Vestibular evaluation done, and is negative for BPPV but positive for left hypofunction. Appreciate therapy evaluations.      Type 2 DM with hyperglycemia CBG (last 3)  Recent Labs    12/05/24 2207 12/06/24 0729 12/06/24 1150  GLUCAP 141* 163* 161*   Resume SSI.  A1c is 7.    Lactic acidosis Resolved with IV fluids.    Obesity Body mass index is 37.15 kg/m.    GERD Stable.    Anxiety and depression Resume home meds.    Hypothyroidism Resume synthroid .    Hyperlipidemia:  Resume lipitor .    Prolonged QTC on EKG Repeat EKG today.     Estimated body mass  index is 37.15 kg/m as calculated from the following:   Height as of this encounter: 5' 6 (1.676 m).   Weight as of this encounter: 104.4 kg.  Code Status: full code.  DVT Prophylaxis:  enoxaparin  (LOVENOX ) injection 40 mg Start: 12/04/24 1700 SCDs Start: 12/03/24 1442   Level of Care: Level of care: Telemetry Family Communication: discussed the plan with her son at bedside.   Disposition Plan:     Remains inpatient appropriate:  pending clinical improvement and possibly to SNF when bed available.    Procedures:  None   Consultants:   None.   Antimicrobials:   Anti-infectives (From admission, onward)    None        Medications  Scheduled Meds:  aspirin  EC  81 mg Oral Daily   atorvastatin   80 mg Oral Daily   citalopram   20 mg Oral Daily   enoxaparin  (LOVENOX ) injection  40 mg Subcutaneous Q24H   gabapentin   100 mg Oral TID   insulin  aspart  0-15 Units Subcutaneous TID WC   levothyroxine   50 mcg Oral Q0600   metoprolol  succinate  25 mg Oral Daily   pantoprazole   40 mg Oral Daily   sodium chloride  flush  3 mL Intravenous Q12H   traZODone   50 mg Oral QHS   Continuous Infusions:   PRN Meds:.acetaminophen  **OR** acetaminophen , ALPRAZolam , hydrALAZINE , traMADol     Subjective:   Alexandra Wolfe was seen and examined today.   No chest pain or sob, no nausea, vomiting.   Objective:   Vitals:   12/06/24 0414 12/06/24 0500 12/06/24 0731 12/06/24 1151  BP: 139/74  (!) 169/71 137/63  Pulse: 82  81 80  Resp:      Temp: 97.7 F (36.5 C)  97.8 F (36.6 C) 97.7 F (36.5 C)  TempSrc:      SpO2: 95%  96% 95%  Weight:  104.4 kg    Height:        Intake/Output Summary (Last 24 hours) at 12/06/2024 1351 Last data filed at 12/06/2024 0500 Gross per 24 hour  Intake 987.79 ml  Output 1000 ml  Net -12.21 ml   Filed Weights   12/03/24 1104 12/04/24 0601 12/06/24 0500  Weight: 99.8 kg 102.6 kg 104.4 kg     Exam General exam: Appears calm and comfortable   Respiratory system: Clear to auscultation. Respiratory effort normal. Cardiovascular system: S1 & S2 heard, RRR.  Gastrointestinal system: Abdomen is soft bs+ Central nervous system: Alert and oriented.  Extremities: no cyanosis.  Skin: No rashes, Psychiatry: Mood & affect appropriate.       Data Reviewed:  I have personally reviewed following labs and imaging studies   CBC Lab Results  Component Value Date   WBC 11.3 (H) 12/04/2024   RBC 4.72 12/04/2024   HGB 12.9 12/04/2024   HCT 41.3 12/04/2024   MCV 87.5 12/04/2024   MCH 27.3 12/04/2024   PLT 287 12/04/2024   MCHC 31.2 12/04/2024   RDW 15.8 (H) 12/04/2024   LYMPHSABS 3.8 09/25/2024   MONOABS 0.8 09/25/2024   EOSABS 0.1 09/25/2024   BASOSABS 0.1 09/25/2024     Last metabolic panel Lab Results  Component Value Date   NA 137 12/04/2024   K 4.2 12/04/2024   CL 103 12/04/2024   CO2 23 12/04/2024   BUN 14 12/04/2024   CREATININE 0.71 12/04/2024   GLUCOSE 158 (H) 12/04/2024   GFRNONAA >60 12/04/2024   GFRAA >60 05/09/2020   CALCIUM  9.3 12/04/2024   PHOS 4.2 02/21/2024   PROT 6.7 12/04/2024   ALBUMIN 4.3 12/04/2024   BILITOT 0.5 12/04/2024   ALKPHOS 83 12/04/2024   AST 57 (H) 12/04/2024   ALT 30 12/04/2024   ANIONGAP 11 12/04/2024    CBG (last 3)  Recent Labs    12/05/24 2207 12/06/24 0729 12/06/24 1150  GLUCAP 141* 163* 161*      Coagulation Profile: Recent Labs  Lab 12/03/24 1130  INR 1.1     Radiology Studies: MR BRAIN WO CONTRAST Result Date: 12/05/2024 EXAM: MRI BRAIN WITHOUT CONTRAST 12/04/2024 08:08:25 PM TECHNIQUE: Multiplanar multisequence MRI of the head/brain was performed without the administration of intravenous contrast. COMPARISON: MR Head without contrast 03/14/2020. CLINICAL HISTORY: recurrent falls. FINDINGS: BRAIN AND VENTRICLES: No acute infarct. No intracranial hemorrhage. No mass. No midline shift. Similar ventriculomegaly, likely due to cerebral atrophy. Similar patchy  T2 hyperintensity of the white matter, nonspecific but compatible with chronic microvascular ischemic change. Normal flow voids. ORBITS: No acute abnormality. SINUSES AND MASTOIDS: No acute abnormality. BONES AND SOFT TISSUES: Normal marrow signal.  No acute soft tissue abnormality. IMPRESSION: 1. No acute intracranial abnormality. 2. Similar chronic findings as detailed above. Electronically signed by: Gilmore Molt 12/05/2024 02:21 AM EST RP Workstation: [id number]       Elgie Butter M.D. [facility] 12/06/2024, 1:51 PM  Available via Epic secure chat 7am-7pm After 7 pm, please refer to night coverage provider listed on amion.    "

## 2024-12-07 LAB — GLUCOSE, CAPILLARY
Glucose-Capillary: 165 mg/dL — ABNORMAL HIGH (ref 70–99)
Glucose-Capillary: 161 mg/dL — ABNORMAL HIGH (ref 70–99)

## 2024-12-07 MED ORDER — CITALOPRAM HYDROBROMIDE 20 MG PO TABS: 20.0000 mg | ORAL_TABLET | Freq: Every day | ORAL | 0 refills | Status: AC

## 2024-12-07 NOTE — TOC Transition Note (Signed)
 Transition of Care [facility]) - Discharge Note   Patient Details  Name: Alexandra Wolfe MRN: [id number] Date of Birth: 1946/07/14  Transition of Care [facility]) CM/SW Contact:  Sherline Clack, LCSWA Phone Number: 12/07/2024, 12:40 PM   Clinical Narrative:     Patient will DC to: [facility] Anticipated DC date: 12/07/2024  Family notified: [name]/son Transport by: [facility]   Per MD patient ready for DC to [facility]. RN to call report prior to discharge [phone], room 129B). RN, patient, patient's family, and facility notified of DC. Discharge Summary and FL2 sent to facility. DC packet on chart. Ambulance transport requested for patient.   CSW will sign off for now as social work intervention is no longer needed. Please consult us  again if new needs arise.    Final next level of care: Skilled Nursing Facility Barriers to Discharge: Barriers Resolved   Patient Goals and CMS Choice Patient states their goals for this hospitalization and ongoing recovery are:: To get better   Choice offered to / list presented to : Patient, Adult Children      Discharge Placement              Patient [facility] bed at: [facility] Patient to be transferred to facility by: [facility] Name of family member notified: Madeleine Rakes Patient and family notified of of transfer: 12/07/24  Discharge Plan and Services Additional resources added to the After Visit Summary for   In-house Referral: Clinical Social Work                                   Social Drivers of Health (SDOH) Interventions SDOH Screenings   Food Insecurity: No Food Insecurity (12/03/2024)  Housing: Low Risk (12/03/2024)  Transportation Needs: No Transportation Needs (12/03/2024)  Utilities: Not At Risk (12/03/2024)  Alcohol Screen: Low Risk (06/25/2023)  Depression (PHQ2-9): Low Risk (11/18/2024)  Financial Resource Strain: Low Risk (04/02/2024)  Physical Activity: Inactive (11/18/2024)  Social  Connections: Socially Isolated (12/03/2024)  Stress: Stress Concern Present (11/18/2024)  Tobacco Use: Medium Risk (12/06/2024)  Health Literacy: Adequate Health Literacy (11/18/2024)     Readmission Risk Interventions     No data to display

## 2024-12-07 NOTE — Progress Notes (Signed)
 Call [place] and spoke to [place] to give report.

## 2024-12-07 NOTE — Discharge Summary (Signed)
 " Physician Discharge Summary   Patient: Alexandra Wolfe MRN: [id number] DOB: 1946/10/20  Admit date:     12/03/2024  Discharge date: 12/07/2024  Discharge Physician: Elgie Butter   PCP: Pcp, No   Recommendations at discharge:  Please follow up with PCP in one week.   Discharge Diagnoses: Principal Problem:   Fall at home, initial encounter Active Problems:   Hypothyroidism   Diabetes mellitus type II, non insulin  dependent (HCC)   Essential hypertension    Hospital Course: Alexandra Wolfe is a 78 y.o. female with past medical history  of  T2DM with neuropathy, morbid obesity, asthma, urinary incontinence, SDH/TBI/neurocognitive deficit, osteoarthritis, lumbar laminectomy, hypothyroidism, anxiety, depression and GERD presenting with recurrent falls, dysuria and suprapubic pain coming in for falls and loss of balance. Patient lives at home and independent.   Assessment and Plan:    Recurrent falls from increased debility She lives alone and has had recurrent falls as per the son.  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  She reports falling due to intermittent vertigo and her knees giving out.  So far no source of infection found. She is probably dehydrated as her lactic acid was elevated.  She was started on IV fluids with improvement in lactic acid.  She denies any new complaints.  MRI brain without contrast is negative for acute stroke.  Therapy eval recommending SNF. Patient agreeable and wants [facility].  Vestibular evaluation done, and is negative for BPPV but positive for left hypofunction. Appreciate therapy evaluations.          Type 2 DM with hyperglycemia Resume SSI.  A1c is 7.      Lactic acidosis Resolved with IV fluids.      Obesity Body mass index is 37.15 kg/m.       GERD Stable.      Anxiety and depression Resume home meds.      Hypothyroidism Resume synthroid .      Hyperlipidemia:   Resume lipitor .      Prolonged QTC on EKG Repeat EKG wnl.          Consultants: none.  Procedures performed: MRI brain.   Disposition: Skilled nursing facility Diet recommendation:  Regular diet DISCHARGE MEDICATION: Allergies as of 12/07/2024       Reactions   Abilify [aripiprazole] Other (See Comments)   REACTION: agitation, patient does not recognize   Zocor [simvastatin] Other (See Comments)   REACTION: myalgia,  patient does not recognize        Medication List     STOP taking these medications    cephALEXin  500 MG capsule Commonly known as: KEFLEX    Gemtesa 75 MG Tabs Generic drug: Vibegron       TAKE these medications    acetaminophen  325 MG tablet Commonly known as: TYLENOL  Take 2 tablets (650 mg total) by mouth every 6 (six) hours as needed for mild pain or headache.   albuterol  108 (90 Base) MCG/ACT inhaler Commonly known as: VENTOLIN  HFA INHALE 2 PUFFS INTO THE LUNGS EVERY 6 HOURS AS NEEDED FOR WHEEZING OR SHORTNESS OF BREATH   aspirin  EC 81 MG tablet Take 81 mg by mouth daily.   atorvastatin  80 MG tablet Commonly known as: LIPITOR  TAKE 1 TABLET DAILY   BIOFLEX PO Take 1 tablet by mouth daily.   budesonide -formoterol  160-4.5 MCG/ACT inhaler Commonly known as: Symbicort  Inhale 2 puffs into the lungs 2 (two) times daily.   CALCIUM  500  PO Take 1,000 mg by mouth 2 (two) times daily.   cetirizine  10 MG tablet Commonly known as: ZYRTEC  TAKE 1 TABLET DAILY   citalopram  20 MG tablet Commonly known as: CELEXA  Take 1 tablet (20 mg total) by mouth daily. Start taking on: December 08, 2024 What changed:  medication strength how much to take   diclofenac  Sodium 1 % Gel Commonly known as: Voltaren  Apply 4 g topically 4 (four) times daily. Apply topically to affected area qid   diphenoxylate -atropine  2.5-0.025 MG tablet Commonly known as: Lomotil  Take 1 tablet by mouth 4 (four) times daily as needed for diarrhea or loose stools.    gabapentin  100 MG capsule Commonly known as: NEURONTIN  TAKE 1 CAPSULE BY MOUTH THREE TIMES DAILY   levothyroxine  50 MCG tablet Commonly known as: SYNTHROID  TAKE 1 TABLET(50 MCG) BY MOUTH DAILY. NEEDS APPOINTMENT   metFORMIN  500 MG tablet Commonly known as: GLUCOPHAGE  Take 1 tablet (500 mg total) by mouth 2 (two) times daily with a meal.   metoprolol  succinate 25 MG 24 hr tablet Commonly known as: TOPROL -XL TAKE 1 TABLET BY MOUTH EVERY DAY   multivitamin with minerals Tabs tablet Take 1 tablet by mouth daily.   pantoprazole  40 MG tablet Commonly known as: PROTONIX  TAKE 1 TABLET BY MOUTH EVERY DAY   polyethylene glycol powder 17 GM/SCOOP powder Commonly known as: GLYCOLAX /MIRALAX  Take 17 g by mouth 2 (two) times daily as needed.   Semaglutide  (1 MG/DOSE) 4 MG/3ML Sopn Inject 1 mg as directed once a week.   traZODone  50 MG tablet Commonly known as: DESYREL  TAKE 1 TABLET BY MOUTH EVERYDAY AT BEDTIME   trospium  20 MG tablet Commonly known as: SANCTURA  Take 1 tablet (20 mg total) by mouth at bedtime. What changed: when to take this   Vitamin D3 125 MCG (5000 UT) Tabs Take 5,000 Units by mouth daily.        Contact information for after-discharge care     Destination     [place] [facility] [place], [place] .   Service: Skilled Nursing Contact information: [address] [place]  [address] [phone]                    Discharge Exam: Filed Weights   12/04/24 0601 12/06/24 0500 12/07/24 0340  Weight: 102.6 kg 104.4 kg 104.5 kg   General exam: Appears calm and comfortable  Respiratory system: Clear to auscultation. Respiratory effort normal. Cardiovascular system: S1 & S2 heard, RRR. No JVD,  Gastrointestinal system: Abdomen is nondistended, soft and nontender.  Central nervous system: Alert and oriented.  Extremities: Symmetric 5 x 5 power. Skin: No rashes,  Psychiatry:. Mood & affect appropriate.    Condition at discharge:  fair  The results of significant diagnostics from this hospitalization (including imaging, microbiology, ancillary and laboratory) are listed below for reference.   Imaging Studies: MR BRAIN WO CONTRAST Result Date: 12/05/2024 EXAM: MRI BRAIN WITHOUT CONTRAST 12/04/2024 08:08:25 PM TECHNIQUE: Multiplanar multisequence MRI of the head/brain was performed without the administration of intravenous contrast. COMPARISON: MR Head without contrast 03/14/2020. CLINICAL HISTORY: recurrent falls. FINDINGS: BRAIN AND VENTRICLES: No acute infarct. No intracranial hemorrhage. No mass. No midline shift. Similar ventriculomegaly, likely due to cerebral atrophy. Similar patchy T2 hyperintensity of the white matter, nonspecific but compatible with chronic microvascular ischemic change. Normal flow voids. ORBITS: No acute abnormality. SINUSES AND MASTOIDS: No acute abnormality. BONES AND SOFT TISSUES: Normal marrow signal. No acute soft tissue abnormality. IMPRESSION: 1. No acute intracranial abnormality.  2. Similar chronic findings as detailed above. Electronically signed by: Gilmore Molt 12/05/2024 02:21 AM EST RP Workstation: [id number]   DG Knee Complete 4 Views Right Result Date: 12/03/2024 CLINICAL DATA:  Bilateral knee injuries after fall. EXAM: RIGHT KNEE - COMPLETE 4+ VIEW COMPARISON:  None Available. FINDINGS: No evidence of fracture, dislocation, or joint effusion. Moderate narrowing of lateral joint space is noted with osteophyte formation. Chondrocalcinosis is noted medially. Soft tissues are unremarkable. 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 No acute abnormality seen. Electronically Signed   By: Lynwood Landy Raddle M.D.   On: 12/03/2024 14:04   DG Knee Complete 4 Views Left Result Date: 12/03/2024 CLINICAL DATA:  Bilateral knee abrasions after fall EXAM: LEFT KNEE - COMPLETE 4+ VIEW COMPARISON:  None Available. FINDINGS: No evidence of fracture, dislocation, or joint  effusion. Moderate to severe narrowing of lateral joint space is noted with osteophyte formation. Chondrocalcinosis is noted medially. Soft tissues are unremarkable. IMPRESSION: Moderate to severe degenerative joint disease is noted laterally. No acute abnormality seen. Electronically Signed   By: Lynwood Landy Raddle M.D.   On: 12/03/2024 14:03   CT CHEST ABDOMEN PELVIS W CONTRAST Result Date: 12/03/2024 EXAM: CT CHEST, ABDOMEN AND PELVIS WITH CONTRAST 12/03/2024 12:28:00 PM TECHNIQUE: CT of the chest, abdomen and pelvis was performed with the administration of 75 mL of iohexol  (OMNIPAQUE ) 350 MG/ML injection. Multiplanar reformatted images are provided for review. Automated exposure control, iterative reconstruction, and/or weight based adjustment of the mA/kV was utilized to reduce the radiation dose to as low as reasonably achievable. COMPARISON: CT chest, abdomen, and pelvis 09/25/2024. CLINICAL HISTORY: 78 year old female with polytrauma, blunt, fall, left side injury. FINDINGS: CHEST: MEDIASTINUM AND LYMPH NODES: Heart and pericardium are unremarkable. Normal heart size. No pericardial effusion. The central airways are clear. No mediastinal, hilar or axillary lymphadenopathy. LUNGS AND PLEURA: Lung volumes and major airways remain normal. No focal consolidation or pulmonary edema. No pleural effusion or pneumothorax. ABDOMEN AND PELVIS: LIVER: Stable liver enhancement. GALLBLADDER AND BILE DUCTS: Status post cholecystectomy. No biliary ductal dilatation. SPLEEN: No acute abnormality. PANCREAS: No acute abnormality. ADRENAL GLANDS: No acute abnormality. KIDNEYS, URETERS AND BLADDER: Symmetric and normal renal contrast excretion. Diminutive ureters. No stones in the kidneys or ureters. No hydronephrosis. No perinephric or periureteral stranding. Distended urinary bladder, estimated volume 601 mL. GI AND BOWEL: Stomach is decompressed. Redundant large bowel. Mild retained stool. Nondilated bowel loops. There is  no bowel obstruction. REPRODUCTIVE ORGANS: No acute abnormality. PERITONEUM AND RETROPERITONEUM: Stable small fat containing umbilical hernia. No ascites. No free air. VASCULATURE: Calcified coronary artery and aortic atherosclerosis. Aorta is normal in caliber. Major arterial structures and portal venous system appear to be patent. ABDOMINAL AND PELVIS LYMPH NODES: No lymphadenopathy. BONES AND SOFT TISSUES: Normal thoracic and lumbar segmentation. Widespread advanced spinal degeneration appears stable since 09/25/2024. 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 Chronic severe bilateral glenohumeral degeneration. No acute osseous abnormality. Stable mild breast tissue asymmetry. No superficial soft tissue injury identified. IMPRESSION: 1. No acute traumatic injury identified in the chest, abdomen, and pelvis. 2. Distended urinary bladder (estimated 601 mL). Query urinary retention. 3. Advanced chronic spinal degeneration. Electronically signed by: Helayne Hurst MD 12/03/2024 12:46 PM EST RP Workstation: [id number]   CT CERVICAL SPINE WO CONTRAST Result Date: 12/03/2024 EXAM: CT CERVICAL SPINE WITHOUT CONTRAST 12/03/2024 12:28:00 PM TECHNIQUE: CT of the cervical spine was performed without the administration of intravenous contrast. Multiplanar reformatted images  are provided for review. Automated exposure control, iterative reconstruction, and/or weight based adjustment of the mA/kV was utilized to reduce the radiation dose to as low as reasonably achievable. COMPARISON: Cervical spine CT 09/25/2024. CLINICAL HISTORY: 78 year old female. Polytrauma, blunt. Fall, left side injury. FINDINGS: BONES AND ALIGNMENT: Mildly improved cervical lordosis. No acute fracture or traumatic malalignment. DEGENERATIVE CHANGES: Chronic C1-C2 degeneration. Advanced chronic cervical facet arthropathy on the right including vacuum facet at C2-C3 through C4-C5. There does appear to be  chronic degenerative facet ankylosis at the cervicothoracic junction. No other convincing facet ankylosis. Chronic severe lower cervical disc and endplate degeneration at C5-C6 and C6-C7, with evidence of developing interbody ankylosis at the latter. Chronic cervical spine degeneration appears stable by CT. SOFT TISSUES: No prevertebral soft tissue swelling. Negative visible non-contrast thoracic inlet. Calcified left ICA origin atherosclerosis. Otherwise negative visible non-contrast neck soft tissues. IMPRESSION: 1. No acute traumatic injury identified in the cervical spine. 2. Stable chronic cervical spine degeneration. Electronically signed by: Helayne Hurst MD 12/03/2024 12:37 PM EST RP Workstation: [id number]   CT HEAD WO CONTRAST Result Date: 12/03/2024 EXAM: CT HEAD WITHOUT CONTRAST 12/03/2024 12:28:00 PM TECHNIQUE: CT of the head was performed without the administration of intravenous contrast. Automated exposure control, iterative reconstruction, and/or weight based adjustment of the mA/kV was utilized to reduce the radiation dose to as low as reasonably achievable. COMPARISON: Brain MRI 05/09/2020, Head CT 09/25/2024. CLINICAL HISTORY: 78 year old female. History of moderate-severe head trauma, fall, and left side injury. FINDINGS: BRAIN AND VENTRICLES: No acute hemorrhage. No evidence of acute infarct. No hydrocephalus. No extra-axial collection. No mass effect or midline shift. Brain volume stable, within normal limits for age. Basal ganglia vascular calcifications incidentally noted and stable. Stable gray white differentiation. Mild for age periventricular white matter hypodensity. No suspicious intracranial vascular hyperdensity. ORBITS: No acute abnormality. SINUSES: Paranasal sinuses, tympanic cavities and mastoids are well aerated. SOFT TISSUES AND SKULL: No acute soft tissue abnormality. No skull fracture. Hyperostosis of the calvarium, normal variant. Calcified atherosclerosis at the skull  base. IMPRESSION: 1. No acute traumatic injury identified. 2. Stable mild for age chronic white matter disease. Electronically signed by: Helayne Hurst MD 12/03/2024 12:34 PM EST RP Workstation: [id number]    Microbiology: Results for orders placed or performed during the hospital encounter of 12/03/24  Culture, blood (routine x 2)     Status: None (Preliminary result)   Collection Time: 12/03/24 11:49 AM   Specimen: BLOOD  Result Value Ref Range Status   Specimen Description BLOOD SITE NOT SPECIFIED  Final   Special Requests   Final    BOTTLES DRAWN AEROBIC AND ANAEROBIC Blood Culture results may not be optimal due to an inadequate volume of blood received in culture bottles   Culture   Final    NO GROWTH 4 DAYS Performed at [facility], [address] [place], [place] [address]    Report Status PENDING  Incomplete  Culture, blood (routine x 2)     Status: None (Preliminary result)   Collection Time: 12/03/24 11:54 AM   Specimen: BLOOD  Result Value Ref Range Status   Specimen Description BLOOD SITE NOT SPECIFIED  Final   Special Requests   Final    BOTTLES DRAWN AEROBIC AND ANAEROBIC Blood Culture adequate volume   Culture   Final    NO GROWTH 4 DAYS Performed at [facility], [address], [place], [place] [address]    Report Status PENDING  Incomplete  Labs: CBC: Recent Labs  Lab 12/03/24 1130 12/03/24 1145 12/04/24 0531  WBC 10.2  --  11.3*  HGB 12.6 13.3 12.9  HCT 39.0 39.0 41.3  MCV 83.2  --  87.5  PLT 356  --  287   Basic Metabolic Panel: Recent Labs  Lab 12/03/24 1130 12/03/24 1145 12/04/24 0531  NA 139 140 137  K 5.0 4.8 4.2  CL 105 107 103  CO2 20*  --  23  GLUCOSE 155* 128* 158*  BUN 15 15 14   CREATININE 0.76 0.80 0.71  CALCIUM  10.1  --  9.3   Liver Function Tests: Recent Labs  Lab 12/03/24 1130 12/04/24 0531  AST 43* 57*  ALT 21 30  ALKPHOS 83 83  BILITOT 0.4 0.5  PROT 6.7 6.7  ALBUMIN 4.4 4.3   CBG: Recent Labs   Lab 12/06/24 0729 12/06/24 1150 12/06/24 1657 12/06/24 2003 12/07/24 0736  GLUCAP 163* 161* 162* 168* 165*    Discharge time spent: 36 minutes.   Signed: Elgie Butter, MD [facility] 12/07/2024 "

## 2024-12-07 NOTE — Progress Notes (Incomplete)
 "        [facility]                                                                               Gianni Fuchs, is a 78 y.o. female, DOB - 30-May-1946, FMW:[id number] Admit date - 12/03/2024    Outpatient Primary MD for the patient is Pcp, No  LOS - 0  days    Brief summary   Alexandra Wolfe is a 78 y.o. female with past medical history  of  T2DM with neuropathy, morbid obesity, asthma, urinary incontinence, SDH/TBI/neurocognitive deficit, osteoarthritis, lumbar laminectomy, hypothyroidism, anxiety, depression and GERD presenting with recurrent falls, dysuria and suprapubic pain coming in for falls and loss of balance. Patient lives at home and independent.   Assessment & Plan    Assessment and Plan:   Recurrent falls from increased debility She lives alone and has had recurrent falls as per the son.  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  She reports falling due to intermittent vertigo and her knees giving out.  So far no source of infection found. She is probably dehydrated as her lactic acid was elevated.  She was started on IV fluids with improvement in lactic acid.  She denies any new complaints.  MRI brain without contrast is negative for acute stroke.  Therapy eval recommending SNF. Patient agreeable and wants [facility].  Vestibular evaluation done, and is negative for BPPV but positive for left hypofunction. Appreciate therapy evaluations.      Type 2 DM with hyperglycemia CBG (last 3)  Recent Labs    12/06/24 1657 12/06/24 2003 12/07/24 0736  GLUCAP 162* 168* 165*   Resume SSI.  A1c is 7.    Lactic acidosis Resolved with IV fluids.    Obesity Body mass index is 37.18 kg/m.    GERD Stable.    Anxiety and depression Resume home meds.    Hypothyroidism Resume synthroid .    Hyperlipidemia:  Resume lipitor .    Prolonged QTC on EKG Repeat EKG today.     Estimated body mass  index is 37.18 kg/m as calculated from the following:   Height as of this encounter: 5' 6 (1.676 m).   Weight as of this encounter: 104.5 kg.  Code Status: full code.  DVT Prophylaxis:  enoxaparin  (LOVENOX ) injection 40 mg Start: 12/04/24 1700 SCDs Start: 12/03/24 1442   Level of Care: Level of care: Telemetry Family Communication: discussed the plan with her son at bedside.   Disposition Plan:     Remains inpatient appropriate:  pending clinical improvement and possibly to SNF when bed available.    Procedures:  None   Consultants:   None.   Antimicrobials:   Anti-infectives (From admission, onward)    None        Medications  Scheduled Meds:  aspirin  EC  81 mg Oral Daily   atorvastatin   80 mg Oral Daily   citalopram   20 mg Oral Daily   enoxaparin  (LOVENOX ) injection  40 mg Subcutaneous Q24H   gabapentin   100 mg Oral TID   insulin  aspart  0-15 Units Subcutaneous TID WC   levothyroxine   50 mcg Oral Q0600   metoprolol  succinate  25 mg Oral Daily   pantoprazole   40 mg Oral Daily   sodium chloride  flush  3 mL Intravenous Q12H   traZODone   50 mg Oral QHS   Continuous Infusions:   PRN Meds:.acetaminophen  **OR** acetaminophen , ALPRAZolam , hydrALAZINE , traMADol     Subjective:   Alexandra Wolfe was seen and examined today.   No chest pain or sob, no nausea, vomiting.   Objective:   Vitals:   12/06/24 2003 12/06/24 2336 12/07/24 0340 12/07/24 0742  BP: 125/68 (!) 160/65 (!) 149/66 (!) 155/82  Pulse: 85 81 89 92  Resp: 20 20 19    Temp: 97.9 F (36.6 C) (!) 97.5 F (36.4 C) 97.7 F (36.5 C) 97.7 F (36.5 C)  TempSrc: Oral Oral Oral   SpO2: 95% 93% 96% 97%  Weight:   104.5 kg   Height:        Intake/Output Summary (Last 24 hours) at 12/07/2024 0953 Last data filed at 12/07/2024 0411 Gross per 24 hour  Intake 440 ml  Output 1000 ml  Net -560 ml   Filed Weights   12/04/24 0601 12/06/24 0500 12/07/24 0340  Weight: 102.6 kg 104.4 kg 104.5 kg      Exam General exam: Appears calm and comfortable  Respiratory system: Clear to auscultation. Respiratory effort normal. Cardiovascular system: S1 & S2 heard, RRR.  Gastrointestinal system: Abdomen is soft bs+ Central nervous system: Alert and oriented.  Extremities: no cyanosis.  Skin: No rashes, Psychiatry: Mood & affect appropriate.       Data Reviewed:  I have personally reviewed following labs and imaging studies   CBC Lab Results  Component Value Date   WBC 11.3 (H) 12/04/2024   RBC 4.72 12/04/2024   HGB 12.9 12/04/2024   HCT 41.3 12/04/2024   MCV 87.5 12/04/2024   MCH 27.3 12/04/2024   PLT 287 12/04/2024   MCHC 31.2 12/04/2024   RDW 15.8 (H) 12/04/2024   LYMPHSABS 3.8 09/25/2024   MONOABS 0.8 09/25/2024   EOSABS 0.1 09/25/2024   BASOSABS 0.1 09/25/2024     Last metabolic panel Lab Results  Component Value Date   NA 137 12/04/2024   K 4.2 12/04/2024   CL 103 12/04/2024   CO2 23 12/04/2024   BUN 14 12/04/2024   CREATININE 0.71 12/04/2024   GLUCOSE 158 (H) 12/04/2024   GFRNONAA >60 12/04/2024   GFRAA >60 05/09/2020   CALCIUM  9.3 12/04/2024   PHOS 4.2 02/21/2024   PROT 6.7 12/04/2024   ALBUMIN 4.3 12/04/2024   BILITOT 0.5 12/04/2024   ALKPHOS 83 12/04/2024   AST 57 (H) 12/04/2024   ALT 30 12/04/2024   ANIONGAP 11 12/04/2024    CBG (last 3)  Recent Labs    12/06/24 1657 12/06/24 2003 12/07/24 0736  GLUCAP 162* 168* 165*      Coagulation Profile: Recent Labs  Lab 12/03/24 1130  INR 1.1     Radiology Studies: No results found.      Elgie Butter M.D. [facility] 12/07/2024, 9:53 AM  Available via Epic secure chat 7am-7pm After 7 pm, please refer to night coverage provider listed on amion.    "

## 2024-12-08 LAB — CULTURE, BLOOD (ROUTINE X 2)
Culture: NO GROWTH
Culture: NO GROWTH
Special Requests: ADEQUATE

## 2024-12-24 ENCOUNTER — Encounter: Payer: Self-pay | Admitting: *Deleted

## 2024-12-24 NOTE — Progress Notes (Signed)
 Alexandra Wolfe                                          MRN: [id number]   12/24/2024   The [facility] Quality Team Specialist reviewed this patient medical record for the purposes of chart review for care gap closure. The following were reviewed: chart review for care gap closure-kidney health evaluation for diabetes:eGFR  and uACR.    [facility] Quality Team
